# Patient Record
Sex: Male | Born: 1975
Health system: Southern US, Community
[De-identification: ages and names within clinical notes are randomized; demographics above are authoritative.]

## PROBLEM LIST (undated history)

## (undated) DIAGNOSIS — D72829 Elevated white blood cell count, unspecified: Secondary | ICD-10-CM

## (undated) DIAGNOSIS — Z7901 Long term (current) use of anticoagulants: Secondary | ICD-10-CM

## (undated) DIAGNOSIS — H539 Unspecified visual disturbance: Secondary | ICD-10-CM

## (undated) DIAGNOSIS — Z87828 Personal history of other (healed) physical injury and trauma: Secondary | ICD-10-CM

## (undated) DIAGNOSIS — E1140 Type 2 diabetes mellitus with diabetic neuropathy, unspecified: Secondary | ICD-10-CM

## (undated) DIAGNOSIS — R0609 Other forms of dyspnea: Secondary | ICD-10-CM

## (undated) DIAGNOSIS — D563 Thalassemia minor: Secondary | ICD-10-CM

## (undated) DIAGNOSIS — I70213 Atherosclerosis of native arteries of extremities with intermittent claudication, bilateral legs: Secondary | ICD-10-CM

## (undated) DIAGNOSIS — N4830 Priapism, unspecified: Secondary | ICD-10-CM

## (undated) DIAGNOSIS — Z973 Presence of spectacles and contact lenses: Secondary | ICD-10-CM

## (undated) DIAGNOSIS — H5440 Blindness, one eye, unspecified eye: Secondary | ICD-10-CM

## (undated) DIAGNOSIS — Z720 Tobacco use: Secondary | ICD-10-CM

## (undated) DIAGNOSIS — R200 Anesthesia of skin: Secondary | ICD-10-CM

## (undated) DIAGNOSIS — I639 Cerebral infarction, unspecified: Secondary | ICD-10-CM

## (undated) DIAGNOSIS — I10 Essential (primary) hypertension: Secondary | ICD-10-CM

## (undated) DIAGNOSIS — G8929 Other chronic pain: Secondary | ICD-10-CM

## (undated) DIAGNOSIS — E119 Type 2 diabetes mellitus without complications: Secondary | ICD-10-CM

## (undated) HISTORY — DX: Unspecified visual disturbance: H53.9

## (undated) HISTORY — DX: Cerebral infarction, unspecified: I63.9

## (undated) HISTORY — PX: EYE SURGERY: SHX253

## (undated) HISTORY — PX: PRIAPISM REPAIR: SHX6040

## (undated) HISTORY — PX: PENECTOMY: SHX741

## (undated) HISTORY — PX: APPENDECTOMY: SHX54

## (undated) HISTORY — DX: Blindness, one eye, unspecified eye: H54.40

---

## 2011-04-29 ENCOUNTER — Encounter: Payer: Self-pay | Admitting: *Deleted

## 2011-04-29 ENCOUNTER — Emergency Department (HOSPITAL_COMMUNITY)
Admission: EM | Admit: 2011-04-29 | Discharge: 2011-04-29 | Disposition: A | Discharge status: Home / Self Care | Payer: Self-pay | Attending: Emergency Medicine | Admitting: Emergency Medicine

## 2011-04-29 DIAGNOSIS — E119 Type 2 diabetes mellitus without complications: Secondary | ICD-10-CM | POA: Insufficient documentation

## 2011-04-29 DIAGNOSIS — H53149 Visual discomfort, unspecified: Secondary | ICD-10-CM | POA: Insufficient documentation

## 2011-04-29 DIAGNOSIS — H409 Unspecified glaucoma: Secondary | ICD-10-CM | POA: Insufficient documentation

## 2011-04-29 DIAGNOSIS — R5383 Other fatigue: Secondary | ICD-10-CM | POA: Insufficient documentation

## 2011-04-29 DIAGNOSIS — H5710 Ocular pain, unspecified eye: Secondary | ICD-10-CM | POA: Insufficient documentation

## 2011-04-29 DIAGNOSIS — F17200 Nicotine dependence, unspecified, uncomplicated: Secondary | ICD-10-CM | POA: Insufficient documentation

## 2011-04-29 DIAGNOSIS — I10 Essential (primary) hypertension: Secondary | ICD-10-CM | POA: Insufficient documentation

## 2011-04-29 DIAGNOSIS — R5381 Other malaise: Secondary | ICD-10-CM | POA: Insufficient documentation

## 2011-04-29 DIAGNOSIS — H538 Other visual disturbances: Secondary | ICD-10-CM | POA: Insufficient documentation

## 2011-04-29 DIAGNOSIS — R51 Headache: Secondary | ICD-10-CM | POA: Insufficient documentation

## 2011-04-29 DIAGNOSIS — H11419 Vascular abnormalities of conjunctiva, unspecified eye: Secondary | ICD-10-CM | POA: Insufficient documentation

## 2011-04-29 DIAGNOSIS — R739 Hyperglycemia, unspecified: Secondary | ICD-10-CM

## 2011-04-29 HISTORY — DX: Essential (primary) hypertension: I10

## 2011-04-29 HISTORY — DX: Priapism, unspecified: N48.30

## 2011-04-29 LAB — URINALYSIS, ROUTINE W REFLEX MICROSCOPIC
Glucose, UA: >1000 mg/dL — AB
Hgb urine dipstick: NEGATIVE
Leukocytes, UA: NEGATIVE
Protein, ur: NEGATIVE mg/dL
Specific Gravity, Urine: 1.043 — ABNORMAL HIGH (ref 1.005–1.030)
pH: 7 (ref 5.0–8.0)

## 2011-04-29 LAB — CBC
HCT: 41.1 % (ref 39.0–52.0)
MCHC: 32.4 g/dL (ref 30.0–36.0)
MCV: 60.4 fL — ABNORMAL LOW (ref 78.0–100.0)
RDW: 15.8 % — ABNORMAL HIGH (ref 11.5–15.5)

## 2011-04-29 LAB — BASIC METABOLIC PANEL
BUN: 8 mg/dL (ref 6–23)
Creatinine, Ser: 0.74 mg/dL (ref 0.50–1.35)
GFR calc Af Amer: >90 mL/min (ref >90)
GFR calc non Af Amer: >90 mL/min (ref >90)

## 2011-04-29 LAB — GLUCOSE, CAPILLARY
Glucose-Capillary: 344 mg/dL — ABNORMAL HIGH (ref 70–99)
Glucose-Capillary: 325 mg/dL — ABNORMAL HIGH (ref 70–99)
Glucose-Capillary: 210 mg/dL — ABNORMAL HIGH (ref 70–99)

## 2011-04-29 LAB — URINE MICROSCOPIC-ADD ON

## 2011-04-29 MED ORDER — PROPARACAINE HCL 0.5 % OP SOLN
2.0000 [drp] | Freq: Once | OPHTHALMIC | Status: AC
  Administered 2011-04-29: 2 [drp] via OPHTHALMIC
  Filled 2011-04-29: qty 15

## 2011-04-29 MED ORDER — SODIUM CHLORIDE 0.9 % IV BOLUS (SEPSIS)
1000.0000 mL | Freq: Once | INTRAVENOUS | Status: AC
  Administered 2011-04-29: 1000 mL via INTRAVENOUS

## 2011-04-29 MED ORDER — INSULIN REGULAR HUMAN 100 UNIT/ML IJ SOLN: 12.0000 [IU] | Freq: Once | INTRAMUSCULAR | Status: DC

## 2011-04-29 MED ORDER — METFORMIN HCL 500 MG PO TABS: 500.0000 mg | ORAL_TABLET | Freq: Two times a day (BID) | ORAL | Status: DC

## 2011-04-29 MED ORDER — INSULIN ASPART 100 UNIT/ML ~~LOC~~ SOLN: 12.0000 [IU] | Freq: Once | SUBCUTANEOUS | Status: DC

## 2011-04-29 MED ORDER — INSULIN REGULAR HUMAN 100 UNIT/ML IJ SOLN: 8.0000 [IU] | Freq: Once | INTRAMUSCULAR | Status: DC

## 2011-04-29 MED ORDER — HYDROCHLOROTHIAZIDE 25 MG PO TABS: 25.0000 mg | ORAL_TABLET | Freq: Every day | ORAL | Status: DC

## 2011-04-29 MED ORDER — INSULIN ASPART 100 UNIT/ML ~~LOC~~ SOLN
8.0000 [IU] | Freq: Once | SUBCUTANEOUS | Status: AC
  Administered 2011-04-29: 8 [IU] via SUBCUTANEOUS
  Filled 2011-04-29: qty 1

## 2011-04-29 NOTE — ED Notes (Signed)
Pt states "I've not taken my diabetic medicine  X 3-4 months, I don't know why the right side of my head keeps hurting, haven't seen anyone for my diabetes in a long time"

## 2011-04-29 NOTE — ED Notes (Signed)
Pt cbg was at 325

## 2011-04-29 NOTE — ED Provider Notes (Signed)
History     CSN: [id number]  Arrival date & time 04/29/11  1119   First MD Initiated Contact with Patient 04/29/11 1232      Chief Complaint  Patient presents with  . Hypertension  . Blood Sugar Problem    (Consider location/radiation/quality/duration/timing/severity/associated sxs/prior treatment) Patient is a 36 y.o. male presenting with headaches.  Headache  This is a new problem. The current episode started yesterday. The problem has been gradually worsening. The headache is associated with bright light. The pain is located in the right unilateral region. The quality of the pain is described as throbbing. The pain is at a severity of 8/10. The pain is moderate. The pain does not radiate. Associated symptoms include malaise/fatigue. Pertinent negatives include no fever, no syncope, no nausea and no vomiting.  Pt states headache comes and goes, has been going on for about 6 months. States began again last night. Admits to some blurred vision. Also states has been out of his blood pressure and diabetes medications for about 3 months. States at times feels weak, nauseated. Denies fever, chills, chest pain, abdominal pain, n/v/d.   Past Medical History  Diagnosis Date  . Diabetes mellitus   . Hypertension   . Priapism     Past Surgical History  Procedure Date  . Penectomy     No family history on file.  History  Substance Use Topics  . Smoking status: Current Some Day Smoker  . Smokeless tobacco: Not on file  . Alcohol Use: Yes     ocassionally      Review of Systems  Constitutional: Positive for malaise/fatigue and fatigue. Negative for fever, chills, activity change and appetite change.  HENT: Negative for hearing loss and congestion.   Eyes: Positive for photophobia, pain and visual disturbance. Negative for discharge.  Respiratory: Negative.   Cardiovascular: Negative.  Negative for syncope.  Gastrointestinal: Negative.  Negative for nausea and vomiting.    Genitourinary: Negative.   Musculoskeletal: Negative.   Neurological: Positive for headaches.  Psychiatric/Behavioral: Negative.     Allergies  Review of patient's allergies indicates no known allergies.  Home Medications  No current outpatient prescriptions on file.  BP 180/92  Pulse 104  Temp(Src) 98.2 F (36.8 C) (Oral)  Resp 16  Wt 193 lb 12.6 oz (87.9 kg)  SpO2 100%  Physical Exam  Nursing note and vitals reviewed. Constitutional: He is oriented to person, place, and time. He appears well-developed and well-nourished. No distress.  HENT:  Head: Normocephalic and atraumatic.  Eyes: EOM are normal. No foreign bodies found. Right conjunctiva is injected. Left conjunctiva is not injected. Right pupil is not reactive. Right pupil is round. Left pupil is round and reactive. Pupils are unequal.  Neck: Neck supple.  Cardiovascular: Normal rate and regular rhythm.   Pulmonary/Chest: Effort normal and breath sounds normal. No respiratory distress.  Abdominal: Soft. Bowel sounds are normal. There is no tenderness.  Musculoskeletal: Normal range of motion. He exhibits no edema.  Neurological: He is alert and oriented to person, place, and time.  Skin: Skin is warm and dry. No erythema.  Psychiatric: He has a normal mood and affect.    ED Course  Procedures (including critical care time)  Pt with hx of htn, diabetes, non compliant. Noted unequal and poorly reactive right pupil. Visual acuity performed. 20/50 L , unable to see from right, only light and colors. Pressure measured. 53 in the right eye, 30 in the left. Pt does not appear to be  in DKA, will administer fluids, insulin, will call ophthamology.  Spoke with Dr. Luciana Axe, pt to follow up either today in the office once blood sugar is stablized, or tomorrow at 9am. Pt's blood glucose improve with IV boluses and SQ insulin.   Results for orders placed during the hospital encounter of 04/29/11  GLUCOSE, CAPILLARY       Component Value Range   Glucose-Capillary 430 (*) 70 - 99 (mg/dL)   Comment 1 Documented in Chart     Comment 2 Notify RN    CBC      Component Value Range   WBC 7.4  4.0 - 10.5 (K/uL)   RBC 6.80 (*) 4.22 - 5.81 (MIL/uL)   Hemoglobin 13.3  13.0 - 17.0 (g/dL)   HCT 16.1  09.6 - 04.5 (%)   MCV 60.4 (*) 78.0 - 100.0 (fL)   MCH 19.6 (*) 26.0 - 34.0 (pg)   MCHC 32.4  30.0 - 36.0 (g/dL)   RDW 40.9 (*) 81.1 - 15.5 (%)   Platelets 146 (*) 150 - 400 (K/uL)  BASIC METABOLIC PANEL      Component Value Range   Sodium 134 (*) 135 - 145 (mEq/L)   Potassium 4.2  3.5 - 5.1 (mEq/L)   Chloride 97  96 - 112 (mEq/L)   CO2 28  19 - 32 (mEq/L)   Glucose, Bld 368 (*) 70 - 99 (mg/dL)   BUN 8  6 - 23 (mg/dL)   Creatinine, Ser 9.14  0.50 - 1.35 (mg/dL)   Calcium 9.0  8.4 - 78.2 (mg/dL)   GFR calc non Af Amer >90  >90 (mL/min)   GFR calc Af Amer >90  >90 (mL/min)  URINALYSIS, ROUTINE W REFLEX MICROSCOPIC      Component Value Range   Color, Urine YELLOW  YELLOW    APPearance CLEAR  CLEAR    Specific Gravity, Urine 1.043 (*) 1.005 - 1.030    pH 7.0  5.0 - 8.0    Glucose, UA >1000 (*) NEGATIVE (mg/dL)   Hgb urine dipstick NEGATIVE  NEGATIVE    Bilirubin Urine NEGATIVE  NEGATIVE    Ketones, ur TRACE (*) NEGATIVE (mg/dL)   Protein, ur NEGATIVE  NEGATIVE (mg/dL)   Urobilinogen, UA 0.2  0.0 - 1.0 (mg/dL)   Nitrite NEGATIVE  NEGATIVE    Leukocytes, UA NEGATIVE  NEGATIVE   URINE MICROSCOPIC-ADD ON      Component Value Range   Squamous Epithelial / LPF RARE  RARE    WBC, UA 3-6  <3 (WBC/hpf)   Bacteria, UA FEW (*) RARE   GLUCOSE, CAPILLARY      Component Value Range   Glucose-Capillary 344 (*) 70 - 99 (mg/dL)  GLUCOSE, CAPILLARY      Component Value Range   Glucose-Capillary 325 (*) 70 - 99 (mg/dL)  GLUCOSE, CAPILLARY      Component Value Range   Glucose-Capillary 210 (*) 70 - 99 (mg/dL)   No results found.  Pt stable for discharge. Will follow up with Dr. Luciana Axe in the office in am. Dr. Luciana Axe  emphasized not to start pt on any medications for his eye.   MDM          Lottie Mussel, PA 04/29/11 2022

## 2011-04-29 NOTE — ED Notes (Signed)
Pt. cbg was 210

## 2011-04-30 NOTE — ED Provider Notes (Signed)
Medical screening examination/treatment/procedure(s) were performed by non-physician practitioner and as supervising physician I was immediately available for consultation/collaboration. Alverta Caccamo Y.   Gavin Pound. Palma Buster, MD 04/30/11 1310

## 2011-07-26 ENCOUNTER — Emergency Department (HOSPITAL_COMMUNITY)
Admission: EM | Admit: 2011-07-26 | Discharge: 2011-07-26 | Disposition: A | Discharge status: Home / Self Care | Payer: Self-pay | Attending: Emergency Medicine | Admitting: Emergency Medicine

## 2011-07-26 ENCOUNTER — Encounter (HOSPITAL_COMMUNITY): Payer: Self-pay | Admitting: *Deleted

## 2011-07-26 DIAGNOSIS — E119 Type 2 diabetes mellitus without complications: Secondary | ICD-10-CM | POA: Insufficient documentation

## 2011-07-26 DIAGNOSIS — R000 Tachycardia, unspecified: Secondary | ICD-10-CM | POA: Insufficient documentation

## 2011-07-26 DIAGNOSIS — R350 Frequency of micturition: Secondary | ICD-10-CM | POA: Insufficient documentation

## 2011-07-26 DIAGNOSIS — R5383 Other fatigue: Secondary | ICD-10-CM | POA: Insufficient documentation

## 2011-07-26 DIAGNOSIS — R5381 Other malaise: Secondary | ICD-10-CM | POA: Insufficient documentation

## 2011-07-26 DIAGNOSIS — E1165 Type 2 diabetes mellitus with hyperglycemia: Secondary | ICD-10-CM

## 2011-07-26 DIAGNOSIS — R112 Nausea with vomiting, unspecified: Secondary | ICD-10-CM | POA: Insufficient documentation

## 2011-07-26 LAB — COMPREHENSIVE METABOLIC PANEL
ALT: 9 U/L (ref 0–53)
Alkaline Phosphatase: 95 U/L (ref 39–117)
BUN: 10 mg/dL (ref 6–23)
CO2: 26 mEq/L (ref 19–32)
Calcium: 8.7 mg/dL (ref 8.4–10.5)
GFR calc Af Amer: >90 mL/min (ref >90)
GFR calc non Af Amer: >90 mL/min (ref >90)
Glucose, Bld: 314 mg/dL — ABNORMAL HIGH (ref 70–99)
Potassium: 4.5 mEq/L (ref 3.5–5.1)
Sodium: 134 mEq/L — ABNORMAL LOW (ref 135–145)
Total Protein: 6.8 g/dL (ref 6.0–8.3)

## 2011-07-26 LAB — POCT I-STAT, CHEM 8
BUN: 10 mg/dL (ref 6–23)
Calcium, Ion: 1.09 mmol/L — ABNORMAL LOW (ref 1.12–1.32)
Chloride: 94 mEq/L — ABNORMAL LOW (ref 96–112)
Glucose, Bld: 310 mg/dL — ABNORMAL HIGH (ref 70–99)
HCT: 42 % (ref 39.0–52.0)
Potassium: 3.9 mEq/L (ref 3.5–5.1)

## 2011-07-26 MED ORDER — INSULIN ASPART 100 UNIT/ML ~~LOC~~ SOLN
10.0000 [IU] | Freq: Once | SUBCUTANEOUS | Status: AC
  Administered 2011-07-26: 10 [IU] via INTRAVENOUS
  Filled 2011-07-26: qty 1

## 2011-07-26 MED ORDER — METFORMIN HCL 1000 MG PO TABS: 1000.0000 mg | ORAL_TABLET | Freq: Two times a day (BID) | ORAL | Status: DC

## 2011-07-26 MED ORDER — SODIUM CHLORIDE 0.9 % IV BOLUS (SEPSIS)
1000.0000 mL | Freq: Once | INTRAVENOUS | Status: DC
  Administered 2011-07-26: 1000 mL via INTRAVENOUS

## 2011-07-26 MED ORDER — SODIUM CHLORIDE 0.9 % IV BOLUS (SEPSIS): 1000.0000 mL | Freq: Once | INTRAVENOUS | Status: DC

## 2011-07-26 MED ORDER — SODIUM CHLORIDE 0.9 % IV BOLUS (SEPSIS)
1000.0000 mL | Freq: Once | INTRAVENOUS | Status: AC
  Administered 2011-07-26: 1000 mL via INTRAVENOUS

## 2011-07-26 NOTE — ED Notes (Addendum)
Patient reports he has not felt well for 3 days.  He states he has had nausea and diaphoresis.  He states he has not been able to eat.  Patient reports he does not have a glucometer at home

## 2011-07-26 NOTE — ED Notes (Signed)
cbg 321

## 2011-07-26 NOTE — Discharge Instructions (Signed)
1. Please follow-up with your PCP. If you do not have a PCP, please call [phone] to establish a care with internal medicine clinic. 2. I increased your metformin dose to 1000 mg twice daily. Please take it regularly. 3. It is very important to establish a long term care with one of primary care doctors for your diabetes.  4. If you have worsening of your symptoms or new symptoms arise, please call the clinic ([phone]), or go to the ER immediately if symptoms are severe.  Diabetes and Exercise Regular exercise is important and can help:   Control blood glucose (sugar).   Decrease blood pressure.    Control blood lipids (cholesterol, triglycerides).   Improve overall health.  BENEFITS FROM EXERCISE  Improved fitness.   Improved flexibility.   Improved endurance.   Increased bone density.   Weight control.   Increased muscle strength.   Decreased body fat.   Improvement of the body's use of insulin, a hormone.   Increased insulin sensitivity.   Reduction of insulin needs.   Reduced stress and tension.   Helps you feel better.  People with diabetes who add exercise to their lifestyle gain additional benefits, including:  Weight loss.   Reduced appetite.   Improvement of the body's use of blood glucose.   Decreased risk factors for heart disease:   Lowering of cholesterol and triglycerides.   Raising the level of good cholesterol (high-density lipoproteins, HDL).   Lowering blood sugar.   Decreased blood pressure.  TYPE 1 DIABETES AND EXERCISE  Exercise will usually lower your blood glucose.   If blood glucose is greater than 240 mg/dl, check urine ketones. If ketones are present, do not exercise.   Location of the insulin injection sites may need to be adjusted with exercise. Avoid injecting insulin into areas of the body that will be exercised. For example, avoid injecting insulin into:   The arms when playing tennis.   The legs when jogging. For  more information, discuss this with your caregiver.   Keep a record of:   Food intake.   Type and amount of exercise.   Expected peak times of insulin action.   Blood glucose levels.  Do this before, during, and after exercise. Review your records with your caregiver. This will help you to develop guidelines for adjusting food intake and insulin amounts.  TYPE 2 DIABETES AND EXERCISE  Regular physical activity can help control blood glucose.   Exercise is important because it may:   Increase the body's sensitivity to insulin.   Improve blood glucose control.   Exercise reduces the risk of heart disease. It decreases serum cholesterol and triglycerides. It also lowers blood pressure.   Those who take insulin or oral hypoglycemic agents should watch for signs of hypoglycemia. These signs include dizziness, shaking, sweating, chills, and confusion.   Body water is lost during exercise. It must be replaced. This will help to avoid loss of body fluids (dehydration) or heat stroke.  Be sure to talk to your caregiver before starting an exercise program to make sure it is safe for you. Remember, any activity is better than none.  Document Released: 07/04/2003 Document Revised: 04/02/2011 Document Reviewed: 10/18/2008 [facility] Patient Information 2012 [place], [place].

## 2011-07-26 NOTE — ED Provider Notes (Signed)
History     CSN: [id number]  Arrival date & time 07/26/11  8657   First MD Initiated Contact with Patient 07/26/11 1308      Chief Complaint  Patient presents with  . Weakness  . Nausea  . Dizziness  . Excessive Sweating  . Headache    (Consider location/radiation/quality/duration/timing/severity/associated sxs/prior treatment) HPI  Patient is 36 yo man with PMH of HTN and DM-II, who presents with generalized weakness, increased urinary frequency and thirty.   Per patient, he started having could like symptoms 3 days ago, including running nose, sore throat, headache, fever, chills, dry cough. He took extra does of his metformin and HCTZ without any help.  He also has nausea and vomited once food materials without blood it yesterday. He does not have diarrhea and abdominal pain, but with mild uncomfortableness in his abdomen. currently his cold likely symptoms are better. But he has poor appetite and decreased oral intake. He feels tired and thirsty all the times. He has increased urinary frequency without dysuria or burning sensation on urination.  denies fever, chills, cough, chest pain, SOB,  hematuria, joint pain or leg swelling.    Past Medical History  Diagnosis Date  . Diabetes mellitus   . Hypertension   . Priapism     Past Surgical History  Procedure Date  . Penectomy     No family history on file.  History  Substance Use Topics  . Smoking status: Current Some Day Smoker  . Smokeless tobacco: Not on file  . Alcohol Use: Yes     ocassionally    Review of Systems  Constitutional: Negative for fever, appetite change and fatigue.       He has decreased appetite   HENT: Negative for ear pain, congestion, sore throat, sneezing, mouth sores, trouble swallowing, neck pain, neck stiffness and voice change.   Eyes: Negative for photophobia, pain, discharge and visual disturbance.  Respiratory: Negative for apnea, cough, choking, chest tightness, shortness of  breath, wheezing and stridor.   Cardiovascular: Negative for chest pain and leg swelling.  Gastrointestinal: Positive for nausea and vomiting. Negative for abdominal pain, diarrhea, constipation, blood in stool, abdominal distention and rectal pain.  Genitourinary: Positive for frequency. Negative for dysuria, urgency and hematuria.  Musculoskeletal: Negative for back pain, joint swelling, arthralgias and gait problem.  Skin: Negative for rash and wound.  Neurological: Positive for weakness. Negative for dizziness, tremors, seizures, syncope, numbness and headaches.  Hematological: Negative for adenopathy. Does not bruise/bleed easily.  Psychiatric/Behavioral: Negative for hallucinations, behavioral problems, confusion and agitation.    Allergies  Review of patient's allergies indicates no known allergies.  Home Medications   Current Outpatient Rx  Name Route Sig Dispense Refill  . HYDROCHLOROTHIAZIDE 25 MG PO TABS Oral Take 25 mg by mouth daily.    Marland Kitchen METFORMIN HCL 500 MG PO TABS Oral Take 500 mg by mouth 2 (two) times daily with a meal.    . METFORMIN HCL 500 MG PO TABS Oral Take 1 tablet (500 mg total) by mouth 2 (two) times daily with a meal. 60 tablet 3    BP 141/77  Pulse 86  Temp(Src) 98.6 F (37 C) (Oral)  Resp 25  Ht 5' 10.5" (1.791 m)  Wt 190 lb (86.183 kg)  BMI 26.88 kg/m2  SpO2 96%  Physical Exam  General: resting in bed, not in acute distress HEENT: PERRL, EOMI, no scleral icterus. Has dry mucous and membrane. Cardiac: S1/S2, tachycardia, RRR, No murmurs, gallops or  rubs Pulm: Good air movement bilaterally, Clear to auscultation bilaterally, No rales, wheezing, rhonchi or rubs. Abd: Soft,  nondistended, nontender, no rebound pain, no organomegaly, BS present Ext: No rashes or edema, 2+DP/PT pulse bilaterally Neuro: alert and oriented X3, cranial nerves II-XII grossly intact, muscle strength 5/5 in all extremeties,  sensation to light touch intact.   ED Course    Procedures (including critical care time)  Patient's symptoms are likely caused by uncontrolled DM-II after recent viral infection. His CBG is 365 today. Patient had a AG of 15. He was treated with IV fluid and 10 U of insulin. Repeat BMP showed that his AG is 12. He feels much better. Patient will be discharged home. His metformin dosage will be increased to 1000 mg Bid. Patient is instructed to establish a care with PCP.      Labs Reviewed  GLUCOSE, CAPILLARY - Abnormal; Notable for the following:    Glucose-Capillary 365 (*)    All other components within normal limits   No results found.   No diagnosis found.    MDM          Lorretta Harp, MD 07/26/11 [phone]

## 2011-07-26 NOTE — ED Notes (Signed)
Upon further discussion with the pt, found out that pt has lived in [place] for 5 years. At one point he had an orange card and went to [facility], pt did not like having different doctors every time he went so he quit going and let his orange card expire. Pt reports that he has had diabetes for 5 years.

## 2011-07-26 NOTE — ED Notes (Signed)
Talked with Dr Oletta Lamas and he reports that Frank Schneider needs to go to the back. Frank Schneider has iv started and running wide open

## 2011-07-26 NOTE — ED Provider Notes (Signed)
I saw and evaluated the patient, reviewed the resident's note and I agree with the findings and plan.   .Face to face Exam:  General:  Awake HEENT:  Atraumatic Resp:  Normal effort Abd:  Nondistended Neuro:No focal weakness Lymph: No adenopathy   After treatment in the ED the patient feels back to baseline and wants to go home.   Nelia Shi, MD 07/26/11 2123

## 2011-07-26 NOTE — ED Notes (Signed)
Pt reports that he recently moved to [facility] and he doesn't have a pcp and lost his glucometer. Pt hasnt been feeling well for several days. Last tried to eat at 3pm yesterday but was too nauseated to eat. Pt reports that he has still been taking his metformin.

## 2011-07-27 LAB — GLUCOSE, CAPILLARY: Glucose-Capillary: 321 mg/dL — ABNORMAL HIGH (ref 70–99)

## 2011-07-28 ENCOUNTER — Encounter (HOSPITAL_COMMUNITY): Payer: Self-pay | Admitting: Anesthesiology

## 2011-07-28 ENCOUNTER — Inpatient Hospital Stay (HOSPITAL_COMMUNITY)
Admission: EM | Admit: 2011-07-28 | Discharge: 2011-08-03 | DRG: 983 | Disposition: A | Discharge status: Home / Self Care | Payer: Self-pay | Attending: Surgery | Admitting: Surgery

## 2011-07-28 ENCOUNTER — Emergency Department (HOSPITAL_COMMUNITY): Payer: Self-pay

## 2011-07-28 ENCOUNTER — Encounter (HOSPITAL_COMMUNITY): Payer: Self-pay | Admitting: Emergency Medicine

## 2011-07-28 ENCOUNTER — Other Ambulatory Visit: Payer: Self-pay

## 2011-07-28 ENCOUNTER — Encounter (HOSPITAL_COMMUNITY): Admission: EM | Disposition: A | Discharge status: Home / Self Care | Payer: Self-pay | Source: Home / Self Care

## 2011-07-28 ENCOUNTER — Inpatient Hospital Stay (HOSPITAL_COMMUNITY): Payer: Self-pay | Admitting: Anesthesiology

## 2011-07-28 DIAGNOSIS — N501 Vascular disorders of male genital organs: Secondary | ICD-10-CM

## 2011-07-28 DIAGNOSIS — F1010 Alcohol abuse, uncomplicated: Secondary | ICD-10-CM | POA: Diagnosis present

## 2011-07-28 DIAGNOSIS — L02219 Cutaneous abscess of trunk, unspecified: Secondary | ICD-10-CM

## 2011-07-28 DIAGNOSIS — IMO0001 Reserved for inherently not codable concepts without codable children: Secondary | ICD-10-CM | POA: Diagnosis present

## 2011-07-28 DIAGNOSIS — A4902 Methicillin resistant Staphylococcus aureus infection, unspecified site: Secondary | ICD-10-CM | POA: Diagnosis present

## 2011-07-28 DIAGNOSIS — Z720 Tobacco use: Secondary | ICD-10-CM

## 2011-07-28 DIAGNOSIS — N493 Fournier gangrene: Secondary | ICD-10-CM

## 2011-07-28 DIAGNOSIS — I10 Essential (primary) hypertension: Secondary | ICD-10-CM | POA: Diagnosis present

## 2011-07-28 DIAGNOSIS — R739 Hyperglycemia, unspecified: Secondary | ICD-10-CM

## 2011-07-28 DIAGNOSIS — L03319 Cellulitis of trunk, unspecified: Secondary | ICD-10-CM

## 2011-07-28 DIAGNOSIS — Z794 Long term (current) use of insulin: Secondary | ICD-10-CM

## 2011-07-28 DIAGNOSIS — F17200 Nicotine dependence, unspecified, uncomplicated: Secondary | ICD-10-CM | POA: Diagnosis present

## 2011-07-28 DIAGNOSIS — K612 Anorectal abscess: Principal | ICD-10-CM | POA: Diagnosis present

## 2011-07-28 HISTORY — DX: Tobacco use: Z72.0

## 2011-07-28 HISTORY — PX: INCISION AND DRAINAGE PERIRECTAL ABSCESS: SHX1804

## 2011-07-28 LAB — CBC
Hemoglobin: 12.7 g/dL — ABNORMAL LOW (ref 13.0–17.0)
MCH: 19.9 pg — ABNORMAL LOW (ref 26.0–34.0)
MCV: 59.6 fL — ABNORMAL LOW (ref 78.0–100.0)
Platelets: 235 10*3/uL (ref 150–400)
RBC: 6.39 MIL/uL — ABNORMAL HIGH (ref 4.22–5.81)
WBC: 24.5 10*3/uL — ABNORMAL HIGH (ref 4.0–10.5)

## 2011-07-28 LAB — BASIC METABOLIC PANEL
Chloride: 86 mEq/L — ABNORMAL LOW (ref 96–112)
GFR calc Af Amer: >90 mL/min (ref >90)
GFR calc non Af Amer: >90 mL/min (ref >90)
Potassium: 4.7 mEq/L (ref 3.5–5.1)
Sodium: 127 mEq/L — ABNORMAL LOW (ref 135–145)

## 2011-07-28 LAB — GLUCOSE, CAPILLARY
Glucose-Capillary: 329 mg/dL — ABNORMAL HIGH (ref 70–99)
Glucose-Capillary: 272 mg/dL — ABNORMAL HIGH (ref 70–99)
Glucose-Capillary: 293 mg/dL — ABNORMAL HIGH (ref 70–99)
Glucose-Capillary: 298 mg/dL — ABNORMAL HIGH (ref 70–99)

## 2011-07-28 LAB — MAGNESIUM: Magnesium: 1.8 mg/dL (ref 1.5–2.5)

## 2011-07-28 LAB — URINALYSIS, ROUTINE W REFLEX MICROSCOPIC
Bilirubin Urine: NEGATIVE
Glucose, UA: >1000 mg/dL — AB
Hgb urine dipstick: NEGATIVE
Specific Gravity, Urine: >1.046 — ABNORMAL HIGH (ref 1.005–1.030)
Urobilinogen, UA: 1 mg/dL (ref 0.0–1.0)

## 2011-07-28 LAB — APTT: aPTT: 31 seconds (ref 24–37)

## 2011-07-28 LAB — DIFFERENTIAL
Basophils Relative: 0 % (ref 0–1)
Eosinophils Relative: 0 % (ref 0–5)
Monocytes Absolute: 1.7 10*3/uL — ABNORMAL HIGH (ref 0.1–1.0)
Monocytes Relative: 7 % (ref 3–12)
Neutrophils Relative %: 84 % — ABNORMAL HIGH (ref 43–77)

## 2011-07-28 LAB — RAPID URINE DRUG SCREEN, HOSP PERFORMED
Amphetamines: NOT DETECTED
Benzodiazepines: NOT DETECTED
Opiates: NOT DETECTED

## 2011-07-28 LAB — HEPATIC FUNCTION PANEL
AST: 12 U/L (ref 0–37)
Albumin: 3.2 g/dL — ABNORMAL LOW (ref 3.5–5.2)
Total Bilirubin: 0.6 mg/dL (ref 0.3–1.2)
Total Protein: 7.4 g/dL (ref 6.0–8.3)

## 2011-07-28 LAB — LACTIC ACID, PLASMA: Lactic Acid, Venous: 1.3 mmol/L (ref 0.5–2.2)

## 2011-07-28 LAB — PROTIME-INR
INR: 1.17 (ref 0.00–1.49)
Prothrombin Time: 15.1 seconds (ref 11.6–15.2)

## 2011-07-28 SURGERY — INCISION AND DRAINAGE, ABSCESS, PERIRECTAL
Anesthesia: General | Site: Perineum | Wound class: Dirty or Infected

## 2011-07-28 MED ORDER — ACETAMINOPHEN 325 MG PO TABS: 650.0000 mg | ORAL_TABLET | Freq: Four times a day (QID) | ORAL | Status: DC | PRN

## 2011-07-28 MED ORDER — PANTOPRAZOLE SODIUM 40 MG IV SOLR
40.0000 mg | Freq: Every day | INTRAVENOUS | Status: DC
  Administered 2011-07-28 – 2011-07-29 (×2): 40 mg via INTRAVENOUS
  Filled 2011-07-28 (×3): qty 40

## 2011-07-28 MED ORDER — SODIUM CHLORIDE 0.9 % IV SOLN: INTRAVENOUS | Status: DC

## 2011-07-28 MED ORDER — PROPOFOL 10 MG/ML IV BOLUS
INTRAVENOUS | Status: DC | PRN
  Administered 2011-07-28: 200 mg via INTRAVENOUS

## 2011-07-28 MED ORDER — ONDANSETRON HCL 4 MG/2ML IJ SOLN: 4.0000 mg | Freq: Four times a day (QID) | INTRAMUSCULAR | Status: DC | PRN

## 2011-07-28 MED ORDER — MORPHINE SULFATE 2 MG/ML IJ SOLN
1.0000 mg | INTRAMUSCULAR | Status: DC | PRN
  Administered 2011-07-28: 2 mg via INTRAVENOUS
  Filled 2011-07-28: qty 1

## 2011-07-28 MED ORDER — SODIUM CHLORIDE 0.9 % IV SOLN
INTRAVENOUS | Status: DC
  Administered 2011-07-29 – 2011-08-02 (×5): via INTRAVENOUS

## 2011-07-28 MED ORDER — PNEUMOCOCCAL VAC POLYVALENT 25 MCG/0.5ML IJ INJ
0.5000 mL | INJECTION | INTRAMUSCULAR | Status: AC
  Administered 2011-07-29: 0.5 mL via INTRAMUSCULAR
  Filled 2011-07-28: qty 0.5

## 2011-07-28 MED ORDER — INSULIN REGULAR BOLUS VIA INFUSION
0.0000 [IU] | Freq: Three times a day (TID) | INTRAVENOUS | Status: DC
  Filled 2011-07-28: qty 10

## 2011-07-28 MED ORDER — VANCOMYCIN HCL IN DEXTROSE 1-5 GM/200ML-% IV SOLN
1000.0000 mg | Freq: Three times a day (TID) | INTRAVENOUS | Status: DC
  Administered 2011-07-28 – 2011-07-29 (×4): 1000 mg via INTRAVENOUS
  Filled 2011-07-28 (×7): qty 200

## 2011-07-28 MED ORDER — LIDOCAINE HCL (CARDIAC) 20 MG/ML IV SOLN
INTRAVENOUS | Status: DC | PRN
  Administered 2011-07-28: 100 mg via INTRAVENOUS

## 2011-07-28 MED ORDER — METOPROLOL TARTRATE 1 MG/ML IV SOLN
5.0000 mg | INTRAVENOUS | Status: AC | PRN
  Administered 2011-07-28: 5 mg via INTRAVENOUS

## 2011-07-28 MED ORDER — OXYCODONE-ACETAMINOPHEN 5-325 MG PO TABS
1.0000 | ORAL_TABLET | ORAL | Status: DC | PRN
  Administered 2011-07-30 – 2011-07-31 (×2): 1 via ORAL
  Administered 2011-07-31 – 2011-08-03 (×9): 2 via ORAL
  Filled 2011-07-28 (×6): qty 2
  Filled 2011-07-28 (×2): qty 1
  Filled 2011-07-28 (×3): qty 2
  Filled 2011-07-28: qty 1

## 2011-07-28 MED ORDER — MIDAZOLAM HCL 5 MG/5ML IJ SOLN
INTRAMUSCULAR | Status: DC | PRN
  Administered 2011-07-28: 2 mg via INTRAVENOUS

## 2011-07-28 MED ORDER — VANCOMYCIN HCL IN DEXTROSE 1-5 GM/200ML-% IV SOLN
1000.0000 mg | Freq: Once | INTRAVENOUS | Status: AC
  Administered 2011-07-28: 1000 mg via INTRAVENOUS
  Filled 2011-07-28: qty 200

## 2011-07-28 MED ORDER — SUCCINYLCHOLINE CHLORIDE 20 MG/ML IJ SOLN
INTRAMUSCULAR | Status: DC | PRN
  Administered 2011-07-28: 100 mg via INTRAVENOUS

## 2011-07-28 MED ORDER — HEPARIN SODIUM (PORCINE) 5000 UNIT/ML IJ SOLN
5000.0000 [IU] | Freq: Three times a day (TID) | INTRAMUSCULAR | Status: DC
  Administered 2011-07-28 – 2011-08-03 (×18): 5000 [IU] via SUBCUTANEOUS
  Filled 2011-07-28 (×21): qty 1

## 2011-07-28 MED ORDER — SODIUM CHLORIDE 0.9 % IV SOLN
INTRAVENOUS | Status: DC
  Administered 2011-07-28: 18:00:00 via INTRAVENOUS
  Administered 2011-07-28: 1000 mL via INTRAVENOUS

## 2011-07-28 MED ORDER — SODIUM CHLORIDE 0.9 % IR SOLN
Status: DC | PRN
  Administered 2011-07-28: 3000 mL

## 2011-07-28 MED ORDER — ACETAMINOPHEN 650 MG RE SUPP: 650.0000 mg | Freq: Four times a day (QID) | RECTAL | Status: DC | PRN

## 2011-07-28 MED ORDER — METOPROLOL TARTRATE 1 MG/ML IV SOLN
INTRAVENOUS | Status: AC
  Filled 2011-07-28: qty 5

## 2011-07-28 MED ORDER — DEXTROSE-NACL 5-0.45 % IV SOLN: INTRAVENOUS | Status: DC

## 2011-07-28 MED ORDER — FENTANYL CITRATE 0.05 MG/ML IJ SOLN
25.0000 ug | INTRAMUSCULAR | Status: DC | PRN
  Administered 2011-07-28: 25 ug via INTRAVENOUS

## 2011-07-28 MED ORDER — KETOROLAC TROMETHAMINE 30 MG/ML IJ SOLN: 15.0000 mg | Freq: Once | INTRAMUSCULAR | Status: DC | PRN

## 2011-07-28 MED ORDER — METRONIDAZOLE IN NACL 5-0.79 MG/ML-% IV SOLN: 500.0000 mg | Freq: Once | INTRAVENOUS | Status: DC

## 2011-07-28 MED ORDER — IOHEXOL 300 MG/ML  SOLN
100.0000 mL | Freq: Once | INTRAMUSCULAR | Status: AC | PRN
  Administered 2011-07-28: 100 mL via INTRAVENOUS

## 2011-07-28 MED ORDER — FENTANYL CITRATE 0.05 MG/ML IJ SOLN
INTRAMUSCULAR | Status: AC
  Administered 2011-07-28: 25 ug
  Filled 2011-07-28: qty 2

## 2011-07-28 MED ORDER — MORPHINE SULFATE 2 MG/ML IJ SOLN
2.0000 mg | INTRAMUSCULAR | Status: DC | PRN
  Administered 2011-07-29 (×3): 2 mg via INTRAVENOUS
  Filled 2011-07-28 (×3): qty 1

## 2011-07-28 MED ORDER — SODIUM CHLORIDE 0.9 % IV SOLN
1.0000 g | INTRAVENOUS | Status: DC
  Administered 2011-07-29 – 2011-08-02 (×5): 1 g via INTRAVENOUS
  Filled 2011-07-28 (×6): qty 1

## 2011-07-28 MED ORDER — ONDANSETRON HCL 4 MG/2ML IJ SOLN
INTRAMUSCULAR | Status: DC | PRN
  Administered 2011-07-28: 4 mg via INTRAVENOUS

## 2011-07-28 MED ORDER — CLINDAMYCIN PHOSPHATE 600 MG/50ML IV SOLN
600.0000 mg | Freq: Three times a day (TID) | INTRAVENOUS | Status: DC
  Administered 2011-07-29 – 2011-08-03 (×18): 600 mg via INTRAVENOUS
  Filled 2011-07-28 (×20): qty 50

## 2011-07-28 MED ORDER — ONDANSETRON HCL 4 MG PO TABS: 4.0000 mg | ORAL_TABLET | Freq: Four times a day (QID) | ORAL | Status: DC | PRN

## 2011-07-28 MED ORDER — CLINDAMYCIN PHOSPHATE 600 MG/50ML IV SOLN
600.0000 mg | Freq: Once | INTRAVENOUS | Status: AC
  Administered 2011-07-28: 600 mg via INTRAVENOUS
  Filled 2011-07-28: qty 50

## 2011-07-28 MED ORDER — MEPERIDINE HCL 50 MG/ML IJ SOLN: 6.2500 mg | INTRAMUSCULAR | Status: DC | PRN

## 2011-07-28 MED ORDER — DOCUSATE SODIUM 100 MG PO CAPS
100.0000 mg | ORAL_CAPSULE | Freq: Two times a day (BID) | ORAL | Status: DC
  Filled 2011-07-28: qty 1

## 2011-07-28 MED ORDER — FENTANYL CITRATE 0.05 MG/ML IJ SOLN
INTRAMUSCULAR | Status: DC | PRN
  Administered 2011-07-28 (×2): 50 ug via INTRAVENOUS
  Administered 2011-07-28: 100 ug via INTRAVENOUS

## 2011-07-28 MED ORDER — GENTAMICIN IN SALINE 1-0.9 MG/ML-% IV SOLN
100.0000 mg | Freq: Once | INTRAVENOUS | Status: DC
  Filled 2011-07-28: qty 100

## 2011-07-28 MED ORDER — SODIUM CHLORIDE 0.9 % IV SOLN
INTRAVENOUS | Status: DC
  Administered 2011-07-28: 2.7 [IU]/h via INTRAVENOUS
  Filled 2011-07-28 (×2): qty 1

## 2011-07-28 MED ORDER — SODIUM CHLORIDE 0.9 % IV SOLN
1.0000 g | INTRAVENOUS | Status: DC
  Administered 2011-07-28: 1 g via INTRAVENOUS
  Filled 2011-07-28: qty 1

## 2011-07-28 MED ORDER — DEXTROSE 50 % IV SOLN
25.0000 mL | INTRAVENOUS | Status: DC | PRN
  Filled 2011-07-28: qty 50

## 2011-07-28 MED ORDER — SODIUM CHLORIDE 0.9 % IV BOLUS (SEPSIS)
1000.0000 mL | Freq: Once | INTRAVENOUS | Status: AC
  Administered 2011-07-28: 1000 mL via INTRAVENOUS

## 2011-07-28 SURGICAL SUPPLY — 26 items
BLADE HEX COATED 2.75 (ELECTRODE) ×2 IMPLANT
BLADE SURG 15 STRL LF DISP TIS (BLADE) ×1 IMPLANT
BLADE SURG 15 STRL SS (BLADE) ×1
CANISTER SUCTION 2500CC (MISCELLANEOUS) ×2 IMPLANT
CLOTH BEACON ORANGE TIMEOUT ST (SAFETY) ×2 IMPLANT
COVER SURGICAL LIGHT HANDLE (MISCELLANEOUS) ×2 IMPLANT
DRSG PAD ABDOMINAL 8X10 ST (GAUZE/BANDAGES/DRESSINGS) ×2 IMPLANT
ELECT REM PT RETURN 9FT ADLT (ELECTROSURGICAL) ×2
ELECTRODE REM PT RTRN 9FT ADLT (ELECTROSURGICAL) ×1 IMPLANT
GAUZE SPONGE 4X4 16PLY XRAY LF (GAUZE/BANDAGES/DRESSINGS) ×2 IMPLANT
GLOVE BIOGEL PI IND STRL 7.0 (GLOVE) ×1 IMPLANT
GLOVE BIOGEL PI INDICATOR 7.0 (GLOVE) ×1
GLOVE EUDERMIC 7 POWDERFREE (GLOVE) ×2 IMPLANT
GOWN STRL NON-REIN LRG LVL3 (GOWN DISPOSABLE) ×2 IMPLANT
GOWN STRL REIN XL XLG (GOWN DISPOSABLE) ×4 IMPLANT
LUBRICANT JELLY K Y 4OZ (MISCELLANEOUS) IMPLANT
NEEDLE HYPO 22GX1.5 SAFETY (NEEDLE) IMPLANT
PACK LITHOTOMY IV (CUSTOM PROCEDURE TRAY) ×2 IMPLANT
PENCIL BUTTON HOLSTER BLD 10FT (ELECTRODE) ×2 IMPLANT
SOL PREP PROV IODINE SCRUB 4OZ (MISCELLANEOUS) ×2 IMPLANT
SPONGE GAUZE 4X4 12PLY (GAUZE/BANDAGES/DRESSINGS) ×2 IMPLANT
SWAB COLLECTION DEVICE MRSA (MISCELLANEOUS) IMPLANT
SYR CONTROL 10ML LL (SYRINGE) IMPLANT
TOWEL OR 17X26 10 PK STRL BLUE (TOWEL DISPOSABLE) ×2 IMPLANT
UNDERPAD 30X30 INCONTINENT (UNDERPADS AND DIAPERS) ×2 IMPLANT
YANKAUER SUCT BULB TIP 10FT TU (MISCELLANEOUS) ×2 IMPLANT

## 2011-07-28 NOTE — ED Provider Notes (Signed)
History     CSN: [id number]  Arrival date & time 07/28/11  0940   First MD Initiated Contact with Patient 07/28/11 1113      Chief Complaint  Patient presents with  . Abscess  . Hyperglycemia    (Consider location/radiation/quality/duration/timing/severity/associated sxs/prior treatment) HPI Comments: Patient with history of diabetes and hypertension presents emergency Department with a chief complaint of groin abscess.  Onset of abscess was Thursday, symptoms have gradually been worsening, pain is rated at a 9/10-does not radiate and is described as a throbbing sensation, associated symptoms include fevers, night sweats, and chills.  Actual temperature was unknown because patient did not have a thermometer, however his growth and states that he was extremely warm. Abscesses currently actively draining.  Note that patient was recently evaluated in the emergency department for hyperglycemia and his metformin dose was increased to 1000 mg twice a day.  Patient states that he has been compliant on medication, but hyperglycemia persists.  Patient denies nausea, vomiting, diarrhea.  Patient is a 36 y.o. male presenting with abscess. The history is provided by the patient.  Abscess  Pertinent negatives include no fever.    Past Medical History  Diagnosis Date  . Diabetes mellitus   . Hypertension   . Priapism     Past Surgical History  Procedure Date  . Penectomy     No family history on file.  History  Substance Use Topics  . Smoking status: Current Some Day Smoker  . Smokeless tobacco: Not on file  . Alcohol Use: Yes     ocassionally      Review of Systems  Constitutional: Negative for fever, chills, diaphoresis and activity change.       Denies night sweats  HENT: Negative for neck stiffness.   Eyes: Negative for visual disturbance.  Respiratory: Negative for shortness of breath.   Cardiovascular: Negative for chest pain.  Gastrointestinal: Negative for abdominal  pain.  Genitourinary: Negative for dysuria, urgency and frequency.  Musculoskeletal: Negative for gait problem.  Skin: Negative for color change and rash.  Neurological: Negative for dizziness, light-headedness and headaches.  Hematological: Negative for adenopathy.  All other systems reviewed and are negative.    Allergies  Review of patient's allergies indicates no known allergies.  Home Medications   Current Outpatient Rx  Name Route Sig Dispense Refill  . HYDROCHLOROTHIAZIDE 25 MG PO TABS Oral Take 25 mg by mouth daily.    Marland Kitchen METFORMIN HCL 1000 MG PO TABS Oral Take 1 tablet (1,000 mg total) by mouth 2 (two) times daily with a meal. 60 tablet 5    BP 157/90  Pulse 94  Temp(Src) 99.8 F (37.7 C) (Oral)  Resp 20  SpO2 96%  Physical Exam  Nursing note and vitals reviewed. Constitutional: He is oriented to person, place, and time. He appears well-developed and well-nourished. He does not have a sickly appearance. He does not appear ill. No distress.  HENT:  Head: Normocephalic and atraumatic.  Eyes: Conjunctivae and EOM are normal.  Neck: Normal range of motion. Neck supple.  Cardiovascular: Normal rate and regular rhythm.   Pulmonary/Chest: Effort normal and breath sounds normal.  Musculoskeletal: He exhibits no edema.  Lymphadenopathy:       Head (right side): No submental, no preauricular and no posterior auricular adenopathy present.       Head (left side): No submental, no submandibular, no preauricular and no posterior auricular adenopathy present.    He has no axillary adenopathy.  Neurological: He  is alert and oriented to person, place, and time.  Skin: Skin is warm and dry. No rash noted. He is not diaphoretic.       4-5 cm sized abscess located on right groin. Extreme tenderness to palpation. Currently draining. Abscess is fluctuant with warmth, mild surrounding erythema.  Induration present, no rectal involvement.     ED Course  Procedures (including critical  care time)  Labs Reviewed  GLUCOSE, CAPILLARY - Abnormal; Notable for the following:    Glucose-Capillary 400 (*)    All other components within normal limits  CBC - Abnormal; Notable for the following:    WBC 24.5 (*)    RBC 6.39 (*)    Hemoglobin 12.7 (*)    HCT 38.1 (*)    MCV 59.6 (*)    MCH 19.9 (*)    All other components within normal limits  DIFFERENTIAL - Abnormal; Notable for the following:    Neutrophils Relative 84 (*)    Lymphocytes Relative 9 (*)    Neutro Abs 20.6 (*)    Monocytes Absolute 1.7 (*)    All other components within normal limits  BASIC METABOLIC PANEL - Abnormal; Notable for the following:    Sodium 127 (*)    Chloride 86 (*)    Glucose, Bld 427 (*)    All other components within normal limits   Ct Pelvis W Contrast  07/28/2011  *RADIOLOGY REPORT*  Clinical Data:  Abscess.  Hyperglycemia.  CT PELVIS WITH CONTRAST  Technique:  Multidetector CT imaging of the pelvis was performed using the standard protocol following the bolus administration of intravenous contrast.  Contrast:   100 ml Omnipaque 300  Comparison:   None.  Findings:  Diffuse inflammatory changes are noted along the right parasagittal perineum and into the posterior gluteal fold.  There is gas within the soft tissues as well, compatible with infection and abscess.  Mild edematous changes are noted on the left but without gas.  The edematous changes and gas extend to the base of the scrotum.  There is no gas within the scrotum or along the penis.  A rectal balloon is in place.  Contrast fills the visualized descending colon.  The appendix is visualized and normal. Atherosclerotic calcifications are present within the iliac vessels without aneurysm.  The urinary bladder is normal.  The visualized small bowel is normal.  There is no significant free fluid. Bilateral inguinal adenopathy is likely reactive.  The bone windows are unremarkable.  IMPRESSION:  1.  Extensive edematous changes, fluid and gas  within the right parasagittal perineum compatible with abscess.  This could represent early Fornier's gangrene. 2.  Multiple bilateral inguinal lymph nodes are likely reactive. 3.  No significant extension into the pelvis.  Original Report Authenticated By: Jamesetta Orleans. MATTERN, M.D.   No diagnosis found.  Pt infection concerning for fournier's gangrene or possible fistula. Pelvic CT w contrast ordered, pt started on clinda and discussed with Dr. Jeraldine Loots. Results pending for likely General Surgery admit.   MDM  Abscess possible early formers gangrene.  Gen. surgery consult in.  Patient started on clindamycin, gentamicin, and Flagyl IV.  Pain managed in the emergency department. Hyperglycemia treated with fluids. The patient appears reasonably stabilized for admission considering the current resources, flow, and capabilities available in the ED at this time, and I doubt any other [facility] requiring further screening and/or treatment in the ED prior to admission.         Jaci Carrel, [place] 07/28/11 [phone]

## 2011-07-28 NOTE — ED Notes (Signed)
Pt reports draining abscess between his rectal area and scrotum. Pt reports

## 2011-07-28 NOTE — H&P (Signed)
Reason for Consult:Perirectal abscess/Fourniers   Referring Physician: Jeraldine Loots (ER-Physician) No primary care doctor, gets medicines from ER   Frank Schneider is an 36 y.o. male.   HPI: The patient is a 36 year old African American male who was seen in the ER on 3/31 with a viral illness and uncontrolled diabetes. After his diabetes was controlled he was discharged home. He reports a small bump on his right perirectal area which started on Friday, 07/24/2010. He been treating it with local warm soaks. His become progressively worse. He's had some chills, and nausea. He's not been able to eat much. This become progressively worse and he  presented to the emergency room and [facility] this morning at 9 AM today. Exam showed a perirectal abscess. CT scan shows extensive edema this changes of fluid and gas within the right parasagittal perineum compatible with an abscess and could represent early Fournier's gangrene. WBC 24.5K, Glucose 427. We plan to admit the patient taken the operating room for incision drainage and debridement of infected tissue. Dr. Jomarie Longs the medical service we'll see in consultation    Past Medical History   Diagnosis  Date   .  Diabetes mellitus    3 admissions to ER with uncontrolled diabetes .  Hypertension     .  Priapism     .  Tobacco use         ETOH use    Past Surgical History   Procedure  Date   .  Penectomy  12 years ago        History reviewed. No pertinent family history.   Social History: reports that he has been smoking Cigarettes.  He has a 4 pack-year smoking history. He has never used smokeless tobacco. He reports that he drinks alcohol. He reports that he uses illicit drugs (Marijuana). Cigs: 1/2 PPD, ETOH 1-2 pints per week, Drugs: MJ  Unemployed Allergies: No Known Allergies   Medications:  I have reviewed the patient's current medications. Prior to Admission:  (Not in a hospital admission) Scheduled:   . clindamycin  (CLEOCIN) IV  600 mg Intravenous Once  . ertapenem  1 g Intravenous Q24H  . gentamicin  100 mg Intravenous Once  . metronidazole  500 mg Intravenous Once  . pneumococcal 23 valent vaccine  0.5 mL Intramuscular Tomorrow-1000  . sodium chloride  1,000 mL Intravenous Once  . vancomycin  1,000 mg Intravenous Once  . vancomycin  1,000 mg Intravenous Q8H   Continuous:   . sodium chloride     [id number]    Results for orders placed during the hospital encounter of 07/28/11 (from the past 48 hour(s))   GLUCOSE, CAPILLARY     Status: Abnormal     Collection Time     07/28/11 10:20 AM       Component  Value  Range  Comment     Glucose-Capillary  400 (*)  70 - 99 (mg/dL)     CBC     Status: Abnormal     Collection Time     07/28/11 12:05 PM       Component  Value  Range  Comment     WBC  24.5 (*)  4.0 - 10.5 (K/uL)       RBC  6.39 (*)  4.22 - 5.81 (MIL/uL)       Hemoglobin  12.7 (*)  13.0 - 17.0 (g/dL)       HCT  09.8 (*)  11.9 - 52.0 (%)  MCV  59.6 (*)  78.0 - 100.0 (fL)       MCH  19.9 (*)  26.0 - 34.0 (pg)       MCHC  33.3   30.0 - 36.0 (g/dL)       RDW  40.9   81.1 - 15.5 (%)       Platelets  235   150 - 400 (K/uL)     DIFFERENTIAL     Status: Abnormal     Collection Time     07/28/11 12:05 PM       Component  Value  Range  Comment     Neutrophils Relative  84 (*)  43 - 77 (%)       Lymphocytes Relative  9 (*)  12 - 46 (%)       Monocytes Relative  7   3 - 12 (%)       Eosinophils Relative  0   0 - 5 (%)       Basophils Relative  0   0 - 1 (%)       Neutro Abs  20.6 (*)  1.7 - 7.7 (K/uL)       Lymphs Abs  2.2   0.7 - 4.0 (K/uL)       Monocytes Absolute  1.7 (*)  0.1 - 1.0 (K/uL)       Eosinophils Absolute  0.0   0.0 - 0.7 (K/uL)       Basophils Absolute  0.0   0.0 - 0.1 (K/uL)       RBC Morphology  TARGET CELLS          WBC Morphology  MILD LEFT SHIFT (1-5% METAS, OCC MYELO, OCC BANDS)          Smear Review  PLATELET COUNT CONFIRMED BY SMEAR        BASIC METABOLIC PANEL      Status: Abnormal     Collection Time     07/28/11 12:05 PM       Component  Value  Range  Comment     Sodium  127 (*)  135 - 145 (mEq/L)       Potassium  4.7   3.5 - 5.1 (mEq/L)       Chloride  86 (*)  96 - 112 (mEq/L)       CO2  25   19 - 32 (mEq/L)       Glucose, Bld  427 (*)  70 - 99 (mg/dL)       BUN  11   6 - 23 (mg/dL)       Creatinine, Ser  0.95   0.50 - 1.35 (mg/dL)       Calcium  91.4   8.4 - 10.5 (mg/dL)       GFR calc non Af Amer  >90   >90 (mL/min)       GFR calc Af Amer  >90   >90 (mL/min)        Ct Pelvis W Contrast   07/28/2011  *RADIOLOGY REPORT*  Clinical Data:  Abscess.  Hyperglycemia.  CT PELVIS WITH CONTRAST  Technique:  Multidetector CT imaging of the pelvis was performed using the standard protocol following the bolus administration of intravenous contrast.  Contrast:   100 ml Omnipaque 300  Comparison:   None.  Findings:  Diffuse inflammatory changes are noted along the right parasagittal perineum and into the posterior gluteal fold.  There is gas within the soft tissues  as well, compatible with infection and abscess.  Mild edematous changes are noted on the left but without gas.  The edematous changes and gas extend to the base of the scrotum.  There is no gas within the scrotum or along the penis.  A rectal balloon is in place.  Contrast fills the visualized descending colon.  The appendix is visualized and normal. Atherosclerotic calcifications are present within the iliac vessels without aneurysm.  The urinary bladder is normal.  The visualized small bowel is normal.  There is no significant free fluid. Bilateral inguinal adenopathy is likely reactive.  The bone windows are unremarkable.  IMPRESSION:  1.  Extensive edematous changes, fluid and gas within the right parasagittal perineum compatible with abscess.  This could represent early Fornier's gangrene. 2.  Multiple bilateral inguinal lymph nodes are likely reactive. 3.  No significant extension into the pelvis.   Original Report Authenticated By: Jamesetta Orleans. MATTERN, M.D.     Review of Systems  Constitutional: Positive for fever and chills. Negative for weight loss, malaise/fatigue and diaphoresis.  HENT: Negative.   Eyes: Negative.   Respiratory: Negative.   Cardiovascular: Negative.   Gastrointestinal: Positive for nausea and diarrhea (Just started having diarrhea.).  Musculoskeletal: Negative.   Skin:        He has a large area right perirectal abscess which started Friday 07/24/11 as a small bump   Neurological: Negative.  Negative for weakness.  Endo/Heme/Allergies: Negative.   Psychiatric/Behavioral: Negative.   Blood pressure 157/90, pulse 94, temperature 99.8 F (37.7 C), temperature source Oral, resp. rate 20, height 5' 10.47" (1.79 m), weight 86.2 kg (190 lb 0.6 oz), SpO2 96.00%. Physical Exam  Constitutional: He is oriented to person, place, and time. He appears well-developed and well-nourished. No distress.  HENT:   Head: Normocephalic and atraumatic.   Nose: Nose normal.  Eyes: Conjunctivae and EOM are normal. Pupils are equal, round, and reactive to light. No scleral icterus.  Neck: Normal range of motion. Neck supple. No JVD present. No tracheal deviation present. No thyromegaly present.  Cardiovascular: Normal rate, regular rhythm and intact distal pulses.  Exam reveals no gallop and no friction rub.    No murmur heard. Respiratory: Effort normal and breath sounds normal. No respiratory distress. He has no wheezes. He has no rales. He exhibits no tenderness.  GI: Soft. Bowel sounds are normal. He exhibits no distension. There is no tenderness. There is no rebound and no guarding.  Genitourinary: Rectum normal and penis normal.       He has a large perirectal abscess about 10-12 cm long along his gluteal fold, about 4-5 cm wide.  It has opened and is blistered in one portion of the site.  Still very inflamed and edematous.  Penis, scrotum and testes are not currently  involved.  Musculoskeletal: Normal range of motion. He exhibits no edema and no tenderness.  Neurological: He is alert and oriented to person, place, and time. He has normal reflexes. No cranial nerve deficit.  Skin: Skin is warm and dry. No rash noted. No erythema.  Psychiatric: He has a normal mood and affect. His behavior is normal. Judgment and thought content normal.    Assessment/Plan: 1.Perirectal abscess/possible early Fournier's gangrene 2.Uncontrolled diabetes 5 years or more 3.Hypertension 4.ETOH use 5. Tobacco use   Plan:  Admit, IV Vancomycin and Invanz, Plan OR later today.  Medicine to manage diabetes, polysubstance use, hypertension        Frank Schneider 07/28/2011, 2:55 PM

## 2011-07-28 NOTE — H&P (Deleted)
Reason for Consult:Perirectal abscess/Fourniers   Referring Physician: Jeraldine Loots (ER-Physician) No primary care doctor, gets medicines from ER   Frank Schneider is an 36 y.o. male.   HPI: The patient is a 36 year old African American male who was seen in the ER on 3/31 with a viral illness and uncontrolled diabetes. After his diabetes was controlled he was discharged home. He reports a small bump on his right perirectal area which started on Friday, 07/24/2010. He been treating it with local warm soaks. His become progressively worse. He's had some chills, and nausea. He's not been able to eat much. This become progressively worse and he  presented to the emergency room and [facility] this morning at 9 AM today. Exam showed a perirectal abscess. CT scan shows extensive edema this changes of fluid and gas within the right parasagittal perineum compatible with an abscess and could represent early Fournier's gangrene. WBC 24.5K, Glucose 427. We plan to admit the patient taken the operating room for incision drainage and debridement of infected tissue. Dr. Jomarie Longs the medical service we'll see in consultation    Past Medical History   Diagnosis  Date   .  Diabetes mellitus    He has at least 3 visits now with uncontrolled diabetes, .  Hypertension     .  Priapism     .  Tobacco use     ETOH Use    Past Surgical History   Procedure  Date   .  Penectomy  12 years ago        History reviewed. No pertinent family history.   Social History: reports that he has been smoking Cigarettes.  He has a 4 pack-year smoking history. He has never used smokeless tobacco. He reports that he drinks alcohol. He reports that he uses illicit drugs (Marijuana). Cigs: 1/2 PPD, ETOH 1-2 pints per week, Drugs: MJ  Unemployed Allergies: No Known Allergies   Medications:  Prior to Admission:  (Not in a hospital admission) Scheduled:   . clindamycin (CLEOCIN) IV  600 mg Intravenous Once  . ertapenem  1  g Intravenous Q24H  . gentamicin  100 mg Intravenous Once  . metronidazole  500 mg Intravenous Once  . pneumococcal 23 valent vaccine  0.5 mL Intramuscular Tomorrow-1000  . sodium chloride  1,000 mL Intravenous Once  . vancomycin  1,000 mg Intravenous Once  . vancomycin  1,000 mg Intravenous Q8H   Continuous:   . sodium chloride     WUJ:[id number] Anti-infectives     Start     Dose/Rate Route Frequency Ordered Stop   07/28/11 2200   vancomycin (VANCOCIN) IVPB 1000 mg/200 mL premix        1,000 mg 200 mL/hr over 60 Minutes Intravenous Every 8 hours 07/28/11 1445     07/28/11 1500   vancomycin (VANCOCIN) IVPB 1000 mg/200 mL premix        1,000 mg 200 mL/hr over 60 Minutes Intravenous  Once 07/28/11 1432     07/28/11 1445   ertapenem (INVANZ) 1 g in sodium chloride 0.9 % 50 mL IVPB        1 g 100 mL/hr over 30 Minutes Intravenous Every 24 hours 07/28/11 1432     07/28/11 1430   gentamicin (GARAMYCIN) IVPB 100 mg        100 mg 200 mL/hr over 30 Minutes Intravenous  Once 07/28/11 1344     07/28/11 1345   metroNIDAZOLE (FLAGYL) IVPB 500 mg  500 mg 100 mL/hr over 60 Minutes Intravenous  Once 07/28/11 1344     07/28/11 1200   clindamycin (CLEOCIN) IVPB 600 mg        600 mg 100 mL/hr over 30 Minutes Intravenous  Once 07/28/11 1152 07/28/11 1326            Results for orders placed during the hospital encounter of 07/28/11 (from the past 48 hour(s))   GLUCOSE, CAPILLARY     Status: Abnormal     Collection Time     07/28/11 10:20 AM       Component  Value  Range  Comment     Glucose-Capillary  400 (*)  70 - 99 (mg/dL)     CBC     Status: Abnormal     Collection Time     07/28/11 12:05 PM       Component  Value  Range  Comment     WBC  24.5 (*)  4.0 - 10.5 (K/uL)       RBC  6.39 (*)  4.22 - 5.81 (MIL/uL)       Hemoglobin  12.7 (*)  13.0 - 17.0 (g/dL)       HCT  91.4 (*)  78.2 - 52.0 (%)       MCV  59.6 (*)  78.0 - 100.0 (fL)       MCH  19.9 (*)  26.0 - 34.0 (pg)        MCHC  33.3   30.0 - 36.0 (g/dL)       RDW  95.6   21.3 - 15.5 (%)       Platelets  235   150 - 400 (K/uL)     DIFFERENTIAL     Status: Abnormal     Collection Time     07/28/11 12:05 PM       Component  Value  Range  Comment     Neutrophils Relative  84 (*)  43 - 77 (%)       Lymphocytes Relative  9 (*)  12 - 46 (%)       Monocytes Relative  7   3 - 12 (%)       Eosinophils Relative  0   0 - 5 (%)       Basophils Relative  0   0 - 1 (%)       Neutro Abs  20.6 (*)  1.7 - 7.7 (K/uL)       Lymphs Abs  2.2   0.7 - 4.0 (K/uL)       Monocytes Absolute  1.7 (*)  0.1 - 1.0 (K/uL)       Eosinophils Absolute  0.0   0.0 - 0.7 (K/uL)       Basophils Absolute  0.0   0.0 - 0.1 (K/uL)       RBC Morphology  TARGET CELLS          WBC Morphology  MILD LEFT SHIFT (1-5% METAS, OCC MYELO, OCC BANDS)          Smear Review  PLATELET COUNT CONFIRMED BY SMEAR        BASIC METABOLIC PANEL     Status: Abnormal     Collection Time     07/28/11 12:05 PM       Component  Value  Range  Comment     Sodium  127 (*)  135 - 145 (mEq/L)       Potassium  4.7  3.5 - 5.1 (mEq/L)       Chloride  86 (*)  96 - 112 (mEq/L)       CO2  25   19 - 32 (mEq/L)       Glucose, Bld  427 (*)  70 - 99 (mg/dL)       BUN  11   6 - 23 (mg/dL)       Creatinine, Ser  0.95   0.50 - 1.35 (mg/dL)       Calcium  78.2   8.4 - 10.5 (mg/dL)       GFR calc non Af Amer  >90   >90 (mL/min)       GFR calc Af Amer  >90   >90 (mL/min)        Ct Pelvis W Contrast   07/28/2011  *RADIOLOGY REPORT*  Clinical Data:  Abscess.  Hyperglycemia.  CT PELVIS WITH CONTRAST  Technique:  Multidetector CT imaging of the pelvis was performed using the standard protocol following the bolus administration of intravenous contrast.  Contrast:   100 ml Omnipaque 300  Comparison:   None.  Findings:  Diffuse inflammatory changes are noted along the right parasagittal perineum and into the posterior gluteal fold.  There is gas within the soft tissues as well, compatible with  infection and abscess.  Mild edematous changes are noted on the left but without gas.  The edematous changes and gas extend to the base of the scrotum.  There is no gas within the scrotum or along the penis.  A rectal balloon is in place.  Contrast fills the visualized descending colon.  The appendix is visualized and normal. Atherosclerotic calcifications are present within the iliac vessels without aneurysm.  The urinary bladder is normal.  The visualized small bowel is normal.  There is no significant free fluid. Bilateral inguinal adenopathy is likely reactive.  The bone windows are unremarkable.  IMPRESSION:  1.  Extensive edematous changes, fluid and gas within the right parasagittal perineum compatible with abscess.  This could represent early Fornier's gangrene. 2.  Multiple bilateral inguinal lymph nodes are likely reactive. 3.  No significant extension into the pelvis.  Original Report Authenticated By: Jamesetta Orleans. MATTERN, M.D.     Review of Systems  Constitutional: Positive for fever and chills. Negative for weight loss, malaise/fatigue and diaphoresis.  HENT: Negative.   Eyes: Negative.   Respiratory: Negative.   Cardiovascular: Negative.   Gastrointestinal: Positive for nausea and diarrhea (Just started having diarrhea.).  Musculoskeletal: Negative.   Skin:        He has a large area right perirectal abscess which started Friday 07/24/11 as a small bump   Neurological: Negative.  Negative for weakness.  Endo/Heme/Allergies: Negative.   Psychiatric/Behavioral: Negative.   Blood pressure 157/90, pulse 94, temperature 99.8 F (37.7 C), temperature source Oral, resp. rate 20, height 5' 10.47" (1.79 m), weight 86.2 kg (190 lb 0.6 oz), SpO2 96.00%. Physical Exam  Constitutional: He is oriented to person, place, and time. He appears well-developed and well-nourished. No distress.  HENT:   Head: Normocephalic and atraumatic.   Nose: Nose normal.  Eyes: Conjunctivae and EOM are normal.  Pupils are equal, round, and reactive to light. No scleral icterus.  Neck: Normal range of motion. Neck supple. No JVD present. No tracheal deviation present. No thyromegaly present.  Cardiovascular: Normal rate, regular rhythm and intact distal pulses.  Exam reveals no gallop and no friction rub.    No murmur heard. Respiratory:  Effort normal and breath sounds normal. No respiratory distress. He has no wheezes. He has no rales. He exhibits no tenderness.  GI: Soft. Bowel sounds are normal. He exhibits no distension. There is no tenderness. There is no rebound and no guarding.  Genitourinary: Rectum normal and penis normal.       He has a large perirectal abscess about 10-12 cm long along his gluteal fold, about 4-5 cm wide.  It has opened and is blistered in one portion of the site.  Still very inflamed and edematous.  Penis, scrotum and testes are not currently involved.  Musculoskeletal: Normal range of motion. He exhibits no edema and no tenderness.  Neurological: He is alert and oriented to person, place, and time. He has normal reflexes. No cranial nerve deficit.  Skin: Skin is warm and dry. No rash noted. No erythema.  Psychiatric: He has a normal mood and affect. His behavior is normal. Judgment and thought content normal.    Assessment/Plan: 1.Perirectal abscess/possible early Fournier's gangrene 2.Uncontrolled diabetes 5 years or more 3.Hypertension 4.ETOH use 5. Tobacco use   Plan:  Admit, IV Vancomycin and Invanz, Plan OR later today.  Medicine to manage diabetes, polysubstance use, hypertension        Brandolyn Shortridge 07/28/2011, 2:55 PM

## 2011-07-28 NOTE — H&P (Signed)
General surgery attending note  I have personally interviewed and examined this patient. I agree with the evaluation and treatment plan outlined by Mr. Marlyne Beards, [place]  He has a complex perirectal abscess in the right gluteal area right anterior and right posterior. This does not involve the sphincters and does not involve the testicle or scrotum at this time. This may be a Fournier's gangrene. He will need to be taken to the operating room promptly this evening for debridement.  I discussed the indications and details of surgery with the patient. I discussed techniques of surgery, wound care and multiple risks involved. His questions are answered. He understands these issues. He agrees with this plan.   Angelia Mould. Derrell Lolling, M.D., [facility] General and Minimally invasive Surgery Breast and Colorectal Surgery Office:   [phone] Pager:   [phone]

## 2011-07-28 NOTE — Anesthesia Preprocedure Evaluation (Addendum)
Anesthesia Evaluation  Patient identified by MRN, date of birth, ID band Patient awake    Reviewed: Allergy & Precautions, H&P , NPO status , Patient's Chart, lab work & pertinent test results  Airway Mallampati: II TM Distance: >3 FB Neck ROM: Full    Dental No notable dental hx.    Pulmonary Current Smoker,  breath sounds clear to auscultation  Pulmonary exam normal       Cardiovascular hypertension, Rhythm:Regular Rate:Normal     Neuro/Psych negative neurological ROS  negative psych ROS   GI/Hepatic negative GI ROS, Neg liver ROS,   Endo/Other  Diabetes mellitus-, Poorly Controlled  Renal/GU negative Renal ROS  negative genitourinary   Musculoskeletal negative musculoskeletal ROS (+)   Abdominal   Peds negative pediatric ROS (+)  Hematology negative hematology ROS (+)   Anesthesia Other Findings   Reproductive/Obstetrics negative OB ROS                          Anesthesia Physical Anesthesia Plan  ASA: III and Emergent  Anesthesia Plan: General   Post-op Pain Management:    Induction: Intravenous  Airway Management Planned: Oral ETT  Additional Equipment:   Intra-op Plan:   Post-operative Plan: Extubation in OR  Informed Consent: I have reviewed the patients History and Physical, chart, labs and discussed the procedure including the risks, benefits and alternatives for the proposed anesthesia with the patient or authorized representative who has indicated his/her understanding and acceptance.   Dental advisory given  Plan Discussed with: CRNA  Anesthesia Plan Comments:         Anesthesia Quick Evaluation

## 2011-07-28 NOTE — Transfer of Care (Signed)
Immediate Anesthesia Transfer of Care Note  Patient: Frank Schneider  Procedure(s) Performed: Procedure(s) (LRB): IRRIGATION AND DEBRIDEMENT PERIRECTAL ABSCESS (N/A)  Patient Location: PACU  Anesthesia Type: General  Level of Consciousness: awake, sedated and patient cooperative  Airway & Oxygen Therapy: Patient Spontanous Breathing and Patient connected to face mask oxygen  Post-op Assessment: Report given to PACU RN and Post -op Vital signs reviewed and stable  Post vital signs: Reviewed and stable  Complications: No apparent anesthesia complications

## 2011-07-28 NOTE — Consult Note (Signed)
Requesting physician: Dr.Ingram  Primary Care Physician: None  Reason for consultation: Uncontrolled DM   History of Present Illness: [name] is a 36 year old African American gentleman with history of hypertension and diabetes was seen in the ER last week with a viral illness in uncontrolled diabetes he was treated with some insulin and fluids and discharged home. He recalls noticing a boil in his perirectal area on Friday which was treated with warm compresses, this subsequently became very extensive rapidly and presented to the ER today where he had a CT scan done which showed perirectal abscess with changes of fluid and gas concerning for early Fournier's gangrene. He is being seen by Dr. Derrell Lolling with [facility] surgery with a plan for surgical debridement today. His CBG was found to be 427 and [facility] were consulted for this. Patient reports taking metformin twice a day which he has been getting through the emergency room. He reports that the last time he saw a primary care physician was approximately 2 years ago at [facility].  Allergies:  No Known Allergies    Past Medical History  Diagnosis Date  . Diabetes mellitus   . Hypertension   . Priapism   . Tobacco use     Past Surgical History  Procedure Date  . Penectomy     Scheduled Meds:   . clindamycin (CLEOCIN) IV  600 mg Intravenous Once  . docusate sodium  100 mg Oral BID  . ertapenem  1 g Intravenous Q24H  . insulin regular  0-10 Units Intravenous TID WC  . pantoprazole (PROTONIX) IV  40 mg Intravenous QHS  . pneumococcal 23 valent vaccine  0.5 mL Intramuscular Tomorrow-1000  . sodium chloride  1,000 mL Intravenous Once  . vancomycin  1,000 mg Intravenous Once  . vancomycin  1,000 mg Intravenous Q8H  . DISCONTD: gentamicin  100 mg Intravenous Once  . DISCONTD: metronidazole  500 mg Intravenous Once   Continuous Infusions:   . sodium chloride    . dextrose 5 % and 0.45% NaCl    . insulin  (NOVOLIN-R) infusion 2.7 Units/hr (07/28/11 1554)  . DISCONTD: sodium chloride    . DISCONTD: sodium chloride     PRN Meds:.acetaminophen, acetaminophen, dextrose, iohexol, morphine, ondansetron  Social History:  single, currently unemployed, smokes half pack per day for the last 15 years Alcohol drinks 3-4 hard drinks per week Uses marijuana approximately once a week Currently lives with some friends   Review of Systems:  Constitutional: Denies fever, chills, diaphoresis, appetite change and fatigue.  HEENT: Denies photophobia, eye pain, redness, hearing loss, ear pain, congestion, sore throat, rhinorrhea, sneezing, mouth sores, trouble swallowing, neck pain, neck stiffness and tinnitus.   Respiratory: Denies SOB, DOE, cough, chest tightness,  and wheezing.   Cardiovascular: Denies chest pain, palpitations and leg swelling.  Gastrointestinal: Denies nausea, vomiting, abdominal pain, diarrhea, constipation, blood in stool and abdominal distention.  Genitourinary: Denies dysuria, urgency, frequency, hematuria, flank pain and difficulty urinating.  Musculoskeletal: Denies myalgias, back pain, joint swelling, arthralgias and gait problem.  Skin: Denies pallor, rash and wound.  Neurological: Denies dizziness, seizures, syncope, weakness, light-headedness, numbness and headaches.  Hematological: Denies adenopathy. Easy bruising, personal or family bleeding history  Psychiatric/Behavioral: Denies suicidal ideation, mood changes, confusion, nervousness, sleep disturbance and agitation   Physical Exam: Blood pressure 143/85, pulse 89, temperature 99.4 F (37.4 C), temperature source Oral, resp. rate 20, height 5\' 10"  (1.778 m), weight 86.183 kg (190 lb), SpO2 99.00%. General exam: Average built African  American gentleman in no acute distress HEENT pupils equal in the right JVD lymphadenopathy extensive CVS regular rate rhythm no murmurs rubs or gallops exam  lungs clear auscultation  bilaterally  abdomen soft nontender with normal bowel sounds no organomegaly Genitourinary: Large perirectal abscess about 15 cm long and 5-6 cm wide. With evidence of blistering and open ulcer. Extremely erythematous and tender  Labs on Admission:  Results for orders placed during the hospital encounter of 07/28/11 (from the past 48 hour(s))  GLUCOSE, CAPILLARY     Status: Abnormal   Collection Time   07/28/11 10:20 AM      Component Value Range Comment   Glucose-Capillary 400 (*) 70 - 99 (mg/dL)   CBC     Status: Abnormal   Collection Time   07/28/11 12:05 PM      Component Value Range Comment   WBC 24.5 (*) 4.0 - 10.5 (K/uL)    RBC 6.39 (*) 4.22 - 5.81 (MIL/uL)    Hemoglobin 12.7 (*) 13.0 - 17.0 (g/dL)    HCT 16.1 (*) 09.6 - 52.0 (%)    MCV 59.6 (*) 78.0 - 100.0 (fL)    MCH 19.9 (*) 26.0 - 34.0 (pg)    MCHC 33.3  30.0 - 36.0 (g/dL)    RDW 04.5  40.9 - 81.1 (%)    Platelets 235  150 - 400 (K/uL)   DIFFERENTIAL     Status: Abnormal   Collection Time   07/28/11 12:05 PM      Component Value Range Comment   Neutrophils Relative 84 (*) 43 - 77 (%)    Lymphocytes Relative 9 (*) 12 - 46 (%)    Monocytes Relative 7  3 - 12 (%)    Eosinophils Relative 0  0 - 5 (%)    Basophils Relative 0  0 - 1 (%)    Neutro Abs 20.6 (*) 1.7 - 7.7 (K/uL)    Lymphs Abs 2.2  0.7 - 4.0 (K/uL)    Monocytes Absolute 1.7 (*) 0.1 - 1.0 (K/uL)    Eosinophils Absolute 0.0  0.0 - 0.7 (K/uL)    Basophils Absolute 0.0  0.0 - 0.1 (K/uL)    RBC Morphology TARGET CELLS      WBC Morphology MILD LEFT SHIFT (1-5% METAS, OCC MYELO, OCC BANDS)      Smear Review PLATELET COUNT CONFIRMED BY SMEAR     BASIC METABOLIC PANEL     Status: Abnormal   Collection Time   07/28/11 12:05 PM      Component Value Range Comment   Sodium 127 (*) 135 - 145 (mEq/L)    Potassium 4.7  3.5 - 5.1 (mEq/L)    Chloride 86 (*) 96 - 112 (mEq/L)    CO2 25  19 - 32 (mEq/L)    Glucose, Bld 427 (*) 70 - 99 (mg/dL)    BUN 11  6 - 23 (mg/dL)     Creatinine, Ser 9.14  0.50 - 1.35 (mg/dL)    Calcium 78.2  8.4 - 10.5 (mg/dL)    GFR calc non Af Amer >90  >90 (mL/min)    GFR calc Af Amer >90  >90 (mL/min)   LACTIC ACID, PLASMA     Status: Normal   Collection Time   07/28/11  2:20 PM      Component Value Range Comment   Lactic Acid, Venous 1.3  0.5 - 2.2 (mmol/L)   HEPATIC FUNCTION PANEL     Status: Abnormal   Collection Time  07/28/11  2:20 PM      Component Value Range Comment   Total Protein 7.4  6.0 - 8.3 (g/dL)    Albumin 3.2 (*) 3.5 - 5.2 (g/dL)    AST 12  0 - 37 (U/L)    ALT 13  0 - 53 (U/L)    Alkaline Phosphatase 95  39 - 117 (U/L)    Total Bilirubin 0.6  0.3 - 1.2 (mg/dL)    Bilirubin, Direct 0.2  0.0 - 0.3 (mg/dL)    Indirect Bilirubin 0.4  0.3 - 0.9 (mg/dL)   MAGNESIUM     Status: Normal   Collection Time   07/28/11  2:20 PM      Component Value Range Comment   Magnesium 1.8  1.5 - 2.5 (mg/dL)   APTT     Status: Normal   Collection Time   07/28/11  2:20 PM      Component Value Range Comment   aPTT 31  24 - 37 (seconds)   PROTIME-INR     Status: Normal   Collection Time   07/28/11  2:20 PM      Component Value Range Comment   Prothrombin Time 15.1  11.6 - 15.2 (seconds)    INR 1.17  0.00 - 1.49    GLUCOSE, CAPILLARY     Status: Abnormal   Collection Time   07/28/11  3:44 PM      Component Value Range Comment   Glucose-Capillary 329 (*) 70 - 99 (mg/dL)    Comment 1 Documented in Chart       Radiological Exams on Admission: Ct Pelvis W Contrast  07/28/2011  *RADIOLOGY REPORT*  Clinical Data:  Abscess.  Hyperglycemia.  CT PELVIS WITH CONTRAST  Technique:  Multidetector CT imaging of the pelvis was performed using the standard protocol following the bolus administration of intravenous contrast.  Contrast:   100 ml Omnipaque 300  Comparison:   None.  Findings:  Diffuse inflammatory changes are noted along the right parasagittal perineum and into the posterior gluteal fold.  There is gas within the soft tissues as well,  compatible with infection and abscess.  Mild edematous changes are noted on the left but without gas.  The edematous changes and gas extend to the base of the scrotum.  There is no gas within the scrotum or along the penis.  A rectal balloon is in place.  Contrast fills the visualized descending colon.  The appendix is visualized and normal. Atherosclerotic calcifications are present within the iliac vessels without aneurysm.  The urinary bladder is normal.  The visualized small bowel is normal.  There is no significant free fluid. Bilateral inguinal adenopathy is likely reactive.  The bone windows are unremarkable.  IMPRESSION:  1.  Extensive edematous changes, fluid and gas within the right parasagittal perineum compatible with abscess.  This could represent early Fornier's gangrene. 2.  Multiple bilateral inguinal lymph nodes are likely reactive. 3.  No significant extension into the pelvis.  Original Report Authenticated By: Jamesetta Orleans. MATTERN, M.D.    Assessment/Plan 1. Perirectal abscess with early Fournier's gangrene And agree with vancomycin and carbapenem, possibly change to Imipenem. I will add Clindamycin for its antitoxin effects  Urgent Surgical debridement per [facility] FU cultures from OR 2. [id number], start Insulin drip using glucomander protocol peri-operatively to achieve rapid glucose control. Based on insulin requirements through the day will transition to long acting insulin with NovoLog sliding scale tomorrow Check hemoglobin A1c Needs to get re-established with PCP 3.  Hypertension: Stable, hold off on medications at this time.  4. polysubstance abuse: Will need counseling once acute illness subsides.    Time Spent on Consultation:  Ashwath Lasch [facility]  [phone] 07/28/2011, 3:58 PM

## 2011-07-28 NOTE — Progress Notes (Signed)
Insulin infusion turned off for procedure. glc 209 at 1745

## 2011-07-28 NOTE — ED Notes (Signed)
Pt reports blood sugar has been high lately and was recently seen at the hospital for High blood sugar. Today blood sugar is 400.

## 2011-07-28 NOTE — ED Notes (Signed)
Graham crackers given to pt 's family at bedside as requested.  Pt is resting comfortably.

## 2011-07-28 NOTE — Anesthesia Postprocedure Evaluation (Signed)
  Anesthesia Post-op Note  Patient: Theme park manager  Procedure(s) Performed: Procedure(s) (LRB): IRRIGATION AND DEBRIDEMENT PERIRECTAL ABSCESS (N/A)  Patient Location: PACU  Anesthesia Type: General  Level of Consciousness: awake and alert   Airway and Oxygen Therapy: Patient Spontanous Breathing  Post-op Pain: mild  Post-op Assessment: Post-op Vital signs reviewed, Patient's Cardiovascular Status Stable, Respiratory Function Stable, Patent Airway and No signs of Nausea or vomiting  Post-op Vital Signs: stable  Complications: No apparent anesthesia complications

## 2011-07-28 NOTE — Progress Notes (Signed)
CM spoke with pt who confirms she self pay [place] resident.  CM discussed and provided written information for self pay pcps, importance of pcp for f/u care, [facility] contact number and other [place] resources Pt voiced understanding and appreciative of resources- [facility] referral completed

## 2011-07-28 NOTE — ED Provider Notes (Signed)
Medical screening examination/treatment/procedure(s) were conducted as a shared visit with non-physician practitioner(s) and myself.  I personally evaluated the patient during the encounter Young male with diabetes now presents with concerns of hyperglycemia and a right perineal abscess.  On my exam the patient was in no distress, though the abscess was notably enlarged and actively draining pus.  The patient's diabetes and the location of his lesion of concern for Fournier's gangrene.  This was demonstrated on a CT scan, which was reviewed by me.  The patient's case was discussed by me with the surgery team.  The patient was admitted to their service following the completion of antibiotics in the emergency department for operative care.    Gerhard Munch, MD 07/28/11 1606

## 2011-07-28 NOTE — ED Notes (Signed)
Pt c/o of abscess to lower buttocks/groin area that is painful 8/10. Pt reports drainage from site.

## 2011-07-28 NOTE — Progress Notes (Signed)
ANTIBIOTIC CONSULT NOTE - INITIAL  Pharmacy Consult for Vancomycin Indication: Groin abscess c/w Fournier's gangrene   No Known Allergies  Patient Measurements: Height: 5' 10.47" (179 cm) Weight: 190 lb 0.6 oz (86.2 kg) IBW/kg (Calculated) : 74.09   Vital Signs: Temp: 99.8 F (37.7 C) (04/02 1001) Temp src: Oral (04/02 1001) BP: 157/90 mmHg (04/02 1217) Pulse Rate: 94  (04/02 1217) Intake/Output from previous day:   Intake/Output from this shift:    Labs:  Basename 07/28/11 1205 07/26/11 1532 07/26/11 1335  WBC 24.5* -- --  HGB 12.7* 14.3 --  PLT 235 -- --  LABCREA -- -- --  CREATININE 0.95 1.00 0.88   Estimated Creatinine Clearance: 113.8 ml/min (by C-G formula based on Cr of 0.95). No results found for this basename: VANCOTROUGH:2,VANCOPEAK:2,VANCORANDOM:2,GENTTROUGH:2,GENTPEAK:2,GENTRANDOM:2,TOBRATROUGH:2,TOBRAPEAK:2,TOBRARND:2,AMIKACINPEAK:2,AMIKACINTROU:2,AMIKACIN:2, in the last 72 hours   Assessment:  27 YOM with diabetes presented with groin abscess represent early Fournier's gangrene per CT or possible fistula. Surgery consulted and ordered to start Vancomycin on top of Ertapenem.  Patient received Clinda 600 mg IV x 1 in the ED  Has ordered for Gentamicin x 1 and Flagyl x 1, Vanc 1gm x 1 but not yet given  Good renal function, leukocytosis  No cultures as of yet  Goal of Therapy:  Vancomycin trough level 15-20 mcg/ml  Plan:   Vancomycin 1gm IV q8h  Continue ertapenem 1gm IV q24 as ordered previously  Ertapenem has anaerobic coverage so rec to d/c Flagyl.   Consider psuedomonas coverage (broaden to imipenem?)  Would check vanc trough at steady state.   Geoffry Paradise Thi 07/28/2011,2:40 PM

## 2011-07-28 NOTE — ED Notes (Signed)
MD at bedside.  Dr. Derrell Lolling [facility] at bedside.

## 2011-07-28 NOTE — Op Note (Signed)
Patient Name:           Frank Schneider   Date of Surgery:        07/28/2011  Pre op Diagnosis:      Fournier's gangrene of the perineum  Post op Diagnosis:    same  Procedure:                 Debridement of skin, subcutaneous tissue, and muscle of the perineum, 8 cm x 24 cm dimension  Surgeon:                     Angelia Mould. Derrell Lolling, M.D., FACS  Assistant:                      none  Operative Indications:   This is a 36 year old African American male with uncontrolled diabetes. He reports a small bump on  his right perirectal area which started on Friday, 07/24/2010 which she's been treating with warm soaks. He was seen in the emergency room on March 31 and was sent home. The area in his right perirectal area became progressively worse and he presented to the emergency room at [facility] today. Exam showed a complex abscess with blistering  and foul odor on the right side. CT scan showed extensive edema with fluid and gas and this area of the perineum in the right anterior position. WBC 24,000. Glucose 427. We admitted the patient, start him on antibiotics, and asked the medical service to control his diabetes, and he is brought to the operating room urgently.  Operative Findings:       The patient had a complex soft-tissue infection in the perineum. This was on the right side anterior to the rectum and not involving the rectum. It extended up to the base of the penis and the base of the scrotum but really did not extend into the scrotum or the  testicle or the penis. There was foul-smelling soupy brown drainage and necrotic skin subcutaneous tissue and muscle all of which was debridement back to healthy bleeding tissue.  Procedure in Detail:          Following the induction of general endotracheal anesthesia the patient was positioned in a dorsal lithotomy position and rigid padded stirrups. Surgical time out was performed. The entire perineum and genitalia were prepped and draped in a sterile  fashion.  I observed an open area anteriorly near the base of the scrotum. This was extensively debrided. There was. Purulent material extending more posteriorly. Aerobic and anaerobic cultures were taken. I debrided the skin and subcutaneous tissue and superficial muscle in small steps until I got all the necrotic and foul purulent material out of the way and there was good bleeding tissue underneath. This was an 8 cm x 24 cm area of debridement. The penis was intact. The scrotum was intact. After cauterizing all bleeders I washed the wound with a pulsatile irrigator and packed it with 4 x 4 saline gauze. I performed  a rectal exam and felt no mass or mucosal abnormality in any direction. Bandages and fishnet panties were placed. The patient was taken recovery room in stable condition. EBL 25 cc. Complications none. Counts correct.     Angelia Mould. Derrell Lolling, M.D., FACS General and Minimally Invasive Surgery Breast and Colorectal Surgery  07/28/2011 6:50 PM

## 2011-07-29 LAB — CBC
HCT: 33.9 % — ABNORMAL LOW (ref 39.0–52.0)
Hemoglobin: 11 g/dL — ABNORMAL LOW (ref 13.0–17.0)
MCH: 19.5 pg — ABNORMAL LOW (ref 26.0–34.0)
MCHC: 32.4 g/dL (ref 30.0–36.0)
RBC: 5.65 MIL/uL (ref 4.22–5.81)

## 2011-07-29 LAB — GLUCOSE, CAPILLARY
Glucose-Capillary: 119 mg/dL — ABNORMAL HIGH (ref 70–99)
Glucose-Capillary: 253 mg/dL — ABNORMAL HIGH (ref 70–99)
Glucose-Capillary: 251 mg/dL — ABNORMAL HIGH (ref 70–99)
Glucose-Capillary: 134 mg/dL — ABNORMAL HIGH (ref 70–99)

## 2011-07-29 LAB — BASIC METABOLIC PANEL
BUN: 6 mg/dL (ref 6–23)
Chloride: 96 mEq/L (ref 96–112)
GFR calc Af Amer: >90 mL/min (ref >90)
GFR calc non Af Amer: >90 mL/min (ref >90)
Glucose, Bld: 138 mg/dL — ABNORMAL HIGH (ref 70–99)
Potassium: 3.6 mEq/L (ref 3.5–5.1)
Sodium: 133 mEq/L — ABNORMAL LOW (ref 135–145)

## 2011-07-29 LAB — MAGNESIUM: Magnesium: 1.8 mg/dL (ref 1.5–2.5)

## 2011-07-29 MED ORDER — INSULIN GLARGINE 100 UNIT/ML ~~LOC~~ SOLN: 15.0000 [IU] | Freq: Every day | SUBCUTANEOUS | Status: DC

## 2011-07-29 MED ORDER — MORPHINE SULFATE 2 MG/ML IJ SOLN
1.0000 mg | INTRAMUSCULAR | Status: DC | PRN
  Administered 2011-07-29: 4 mg via INTRAVENOUS
  Administered 2011-07-29 (×2): 2 mg via INTRAVENOUS
  Administered 2011-07-30 (×2): 4 mg via INTRAVENOUS
  Administered 2011-07-30: 2 mg via INTRAVENOUS
  Administered 2011-07-30 – 2011-08-02 (×11): 4 mg via INTRAVENOUS
  Administered 2011-08-02: 2 mg via INTRAVENOUS
  Administered 2011-08-03: 4 mg via INTRAVENOUS
  Filled 2011-07-29 (×2): qty 2
  Filled 2011-07-29: qty 1
  Filled 2011-07-29 (×2): qty 2
  Filled 2011-07-29: qty 1
  Filled 2011-07-29: qty 2
  Filled 2011-07-29: qty 1
  Filled 2011-07-29 (×5): qty 2
  Filled 2011-07-29: qty 1
  Filled 2011-07-29 (×3): qty 2
  Filled 2011-07-29 (×2): qty 1
  Filled 2011-07-29 (×2): qty 2

## 2011-07-29 MED ORDER — POLYETHYLENE GLYCOL 3350 17 G PO PACK
17.0000 g | PACK | Freq: Once | ORAL | Status: DC
  Filled 2011-07-29: qty 1

## 2011-07-29 MED ORDER — LIVING WELL WITH DIABETES BOOK
Freq: Once | Status: AC
  Administered 2011-07-29: 12:00:00
  Filled 2011-07-29: qty 1

## 2011-07-29 MED ORDER — VANCOMYCIN HCL 1000 MG IV SOLR
1500.0000 mg | Freq: Three times a day (TID) | INTRAVENOUS | Status: DC
  Administered 2011-07-29 – 2011-07-31 (×4): 1500 mg via INTRAVENOUS
  Filled 2011-07-29 (×8): qty 1500

## 2011-07-29 MED ORDER — BD GETTING STARTED TAKE HOME KIT: 1/2ML X 30G SYRINGES
1.0000 | Freq: Once | Status: AC
  Administered 2011-07-29: 1
  Filled 2011-07-29: qty 1

## 2011-07-29 MED ORDER — INSULIN ASPART 100 UNIT/ML ~~LOC~~ SOLN
0.0000 [IU] | Freq: Three times a day (TID) | SUBCUTANEOUS | Status: DC
  Administered 2011-07-29: 3 [IU] via SUBCUTANEOUS
  Administered 2011-07-29 – 2011-07-30 (×4): 8 [IU] via SUBCUTANEOUS

## 2011-07-29 MED ORDER — INSULIN ASPART PROT & ASPART (70-30 MIX) 100 UNIT/ML ~~LOC~~ SUSP
12.0000 [IU] | Freq: Two times a day (BID) | SUBCUTANEOUS | Status: DC
  Administered 2011-07-29 – 2011-07-30 (×2): 12 [IU] via SUBCUTANEOUS
  Filled 2011-07-29: qty 3

## 2011-07-29 NOTE — Progress Notes (Addendum)
Patient admitted with uncontrolled diabetes, perirectal abscess.  S/p I&D 07/28/11.  Patient was initially placed on IV insulin drip per GlucoStabilizer.  Taken off IV insulin drip at 1am today.  7AM CBG today 253 mg/dl.  Patient was taken off IV insulin drip and not given any long-acting insulin last night.  Scheduled to get 15 units Lantus tonight at bedtime.  Spoke with pt about his diabetes.  Discussed A1C results with him and explained what an A1C is, basic pathophysiology of DM Type 2, basic home care, importance of checking CBGs and maintaining good CBG control to prevent long-term and short-term complications.  Reviewed signs and symptoms of hyperglycemia and hypoglycemia.  RNs to provide ongoing basic DM education at bedside with this patient.  Have ordered educational booklet, insulin starter kit, and DM videos.  Discussed the possibility that patient may need to take insulin at home to control his CBGs.  Patient hesitant but open to taking insulin at home.  Will go ahead and order an insulin starter kit so that RNs can begin insulin instruction with patient just in case decision made to send patient home on insulin.  Gave patient information on obtaining an inexpensive CBG meter OTC at [facility].  Encouraged patient to purchase a meter and strips at [employer] (meter $16 and box of 50 strips $9) and encouraged patient to check his CBGs at least twice a day at home and to record all the results.  Noted care management following patient.  Patient needs help finding affordable care after d/c (patient is self-pay).  Needs follow-up with MD for further DM management.  MD, if you decide to send patient home on insulin, patient will need to be d/c'd on a more affordable insulin regimen.   Patient will not be able to pay for Lantus and Novolog out of pocket.   If you decide to d/c patient on insulin, please consider switching patient to 70/30 insulin bid.  Patient can purchase Humulin Reli-on 70/30  insulin at [facility] for $24.88 per vial out of pocket.   Noted patient to start on Lantus 15 units QHS tonight at bedtime.  Might consider switching patient to 70/30 insulin- 10 units bid with meals (to start tonight at dinner).    Will follow Ambrose Finland RN, MSN, CDE Diabetes Coordinator Inpatient Diabetes Program [phone]

## 2011-07-29 NOTE — Progress Notes (Signed)
Physical Therapy Wound EVAL Patient Details  Name: Frank Schneider MRN: [id number] Date of Birth: [birth year]  Today's Date: 07/29/2011 Time:  -     Subjective     Pain Score: Pain Score:   3  Wound Assessment  Wound 07/29/11 Scrotum s/p surgical I&D of scrotum -- Fournier's Gangrene (Active)  Site / Wound Assessment Painful;Pink;Red;Brown 07/29/2011  1:00 PM  % Wound base Red or Granulating 70% 07/29/2011  1:00 PM  % Wound base Other (Comment) 30% 07/29/2011  1:00 PM  Peri-wound Assessment Edema;Maceration 07/29/2011  1:00 PM  Wound Length (cm) 7.2 cm 07/29/2011  1:00 PM  Wound Width (cm) 4.5 cm 07/29/2011  1:00 PM  Wound Depth (cm) 4.2 cm 07/29/2011  1:00 PM  Drainage Amount Minimal 07/29/2011  1:00 PM  Drainage Description Serosanguineous;No odor 07/29/2011  1:00 PM  Non-staged Wound Description Not applicable 07/29/2011  1:00 PM  Treatment Cleansed;Hydrotherapy (Pulse lavage);Packing (Saline gauze) 07/29/2011  1:00 PM  Dressing Type ABD;Moist to dry;Mesh briefs 07/29/2011  1:00 PM  Dressing Changed Changed 07/29/2011  1:00 PM  Dressing Status Old drainage;New drainage 07/29/2011  1:00 PM     Incision 07/28/11 Perineum Other (Comment) (Active)  Dressing Type ABD 07/29/2011  9:49 AM  Dressing Clean;Intact;Dry 07/29/2011  9:49 AM   **depth difficult to assess due to pain level; Pt was premedicated with IV meds prior to treatment and tolerated well.  Hydrotherapy Pulsed lavage therapy - wound location: scrotum Pulsed Lavage with Suction (psi):  (4-8) Pulsed Lavage Tip: Tip with splash shield   Wound Assessment and Plan  Wound Therapy - Assess/Plan/Recommendations Wound Therapy - Clinical Statement: pt will benefit from pulsed lavage to cleanse wound and facilitate healing. Wound Therapy - Functional Problem List: painful mobility Factors Delaying/Impairing Wound Healing: Infection - systemic/local;Substance abuse;Diabetes Mellitus (hx polysubstance abuse) Hydrotherapy Plan: Debridement;Dressing  change;Patient/family education;Pulsatile lavage with suction Wound Therapy - Frequency: 6X / week Wound Therapy - Follow Up Recommendations: Home health RN  Wound Therapy Goals- Improve the function of patient's integumentary system by progressing the wound(s) through the phases of wound healing (inflammation - proliferation - remodeling) by: Decrease Necrotic Tissue to: 10 Decrease Necrotic Tissue - Progress: Goal set today Increase Granulation Tissue to: 90 Increase Granulation Tissue - Progress: Goal set today Patient/Family will be able to : verbalize cleansing and dessing change Patient/Family Instruction Goal - Progress: Goal set today Goals/treatment plan/discharge plan were made with and agreed upon by patient/family: Yes Time For Goal Achievement: 7 days Wound Therapy - Potential for Goals: Good  Goals will be updated until maximal potential achieved or discharge criteria met.  Discharge criteria: when goals achieved, discharge from hospital, MD decision/surgical intervention, no progress towards goals, refusal/missing three consecutive treatments without notification or medical reason.  [facility] 07/29/2011, 2:10 PM

## 2011-07-29 NOTE — Progress Notes (Signed)
Subjective: Pt w/o complaints Objective: Filed Vitals:   07/29/11 1000 07/29/11 1400 07/29/11 1631 07/29/11 1800  BP: 134/67 138/79  130/67  Pulse: 87 77  81  Temp: 98.4 F (36.9 C) 97.3 F (36.3 C)  99.6 F (37.6 C)  TempSrc: Oral Oral  Oral  Resp: 20 20  20   Height:      Weight:      SpO2: 100% 100% 96% 99%   Weight change:   Intake/Output Summary (Last 24 hours) at 07/29/11 2012 Last data filed at 07/29/11 1700  Gross per 24 hour  Intake 1898.75 ml  Output   1670 ml  Net 228.75 ml    General: Alert, awake, oriented x3, in no acute distress.  HEENT: [place]/AT PEERL, EOMI Neck: Trachea midline,  no masses, no thyromegal,y no JVD, no carotid bruit OROPHARYNX:  Moist, No exudate/ erythema/lesions.  Heart: Regular rate and rhythm, without murmurs, rubs, gallops, PMI non-displaced, no heaves or thrills on palpation.  Lungs: Clear to auscultation, no wheezing or rhonchi noted. No increased vocal fremitus resonant to percussion    Lab Results:  Basename 07/29/11 0434 07/28/11 1420 07/28/11 1205  NA 133* -- 127*  K 3.6 -- 4.7  CL 96 -- 86*  CO2 29 -- 25  GLUCOSE 138* -- 427*  BUN 6 -- 11  CREATININE 0.91 -- 0.95  CALCIUM 8.5 -- 10.1  MG 1.8 1.8 --  PHOS -- -- --    Basename 07/28/11 1420  AST 12  ALT 13  ALKPHOS 95  BILITOT 0.6  PROT 7.4  ALBUMIN 3.2*   No results found for this basename: LIPASE:2,AMYLASE:2 in the last 72 hours  Basename 07/29/11 0434 07/28/11 1205  WBC 19.3* 24.5*  NEUTROABS -- 20.6*  HGB 11.0* 12.7*  HCT 33.9* 38.1*  MCV 60.0* 59.6*  PLT 246 235   No results found for this basename: CKTOTAL:3,CKMB:3,CKMBINDEX:3,TROPONINI:3 in the last 72 hours No components found with this basename: POCBNP:3 No results found for this basename: DDIMER:2 in the last 72 hours  Basename 07/28/11 1420  HGBA1C 11.3*   No results found for this basename: CHOL:2,HDL:2,LDLCALC:2,TRIG:2,CHOLHDL:2,LDLDIRECT:2 in the last 72 hours No results found for this  basename: TSH,T4TOTAL,FREET3,T3FREE,THYROIDAB in the last 72 hours No results found for this basename: VITAMINB12:2,FOLATE:2,FERRITIN:2,TIBC:2,IRON:2,RETICCTPCT:2 in the last 72 hours  Micro Results: Recent Results (from the past 240 hour(s))  SURGICAL PCR SCREEN     Status: Abnormal   Collection Time   07/28/11  4:46 PM      Component Value Range Status Comment   MRSA, PCR NEGATIVE  NEGATIVE  Final    Staphylococcus aureus POSITIVE (*) NEGATIVE  Final   ANAEROBIC CULTURE     Status: Normal (Preliminary result)   Collection Time   07/28/11  6:24 PM      Component Value Range Status Comment   Specimen Description PERITONEAL FOURNIER'S GANGRENE   Final    Special Requests NONE   Final    Gram Stain     Final    Value: NO WBC SEEN     NO SQUAMOUS EPITHELIAL CELLS SEEN     RARE GRAM POSITIVE COCCI IN PAIRS     RARE GRAM POSITIVE RODS   Culture     Final    Value: NO ANAEROBES ISOLATED; CULTURE IN PROGRESS FOR 5 DAYS   Report Status PENDING   Incomplete   WOUND CULTURE     Status: Normal (Preliminary result)   Collection Time   07/28/11  6:24 PM  Component Value Range Status Comment   Specimen Description PERITONEAL FOURNIER'S GANGRENE   Final    Special Requests NONE   Final    Gram Stain     Final    Value: NO WBC SEEN     NO SQUAMOUS EPITHELIAL CELLS SEEN     RARE GRAM POSITIVE COCCI IN PAIRS     RARE GRAM POSITIVE RODS   Culture NO GROWTH   Final    Report Status PENDING   Incomplete     Studies/Results: Ct Pelvis W Contrast  07/28/2011  *RADIOLOGY REPORT*  Clinical Data:  Abscess.  Hyperglycemia.  CT PELVIS WITH CONTRAST  Technique:  Multidetector CT imaging of the pelvis was performed using the standard protocol following the bolus administration of intravenous contrast.  Contrast:   100 ml Omnipaque 300  Comparison:   None.  Findings:  Diffuse inflammatory changes are noted along the right parasagittal perineum and into the posterior gluteal fold.  There is gas within the  soft tissues as well, compatible with infection and abscess.  Mild edematous changes are noted on the left but without gas.  The edematous changes and gas extend to the base of the scrotum.  There is no gas within the scrotum or along the penis.  A rectal balloon is in place.  Contrast fills the visualized descending colon.  The appendix is visualized and normal. Atherosclerotic calcifications are present within the iliac vessels without aneurysm.  The urinary bladder is normal.  The visualized small bowel is normal.  There is no significant free fluid. Bilateral inguinal adenopathy is likely reactive.  The bone windows are unremarkable.  IMPRESSION:  1.  Extensive edematous changes, fluid and gas within the right parasagittal perineum compatible with abscess.  This could represent early Fornier's gangrene. 2.  Multiple bilateral inguinal lymph nodes are likely reactive. 3.  No significant extension into the pelvis.  Original Report Authenticated By: Jamesetta Orleans. MATTERN, M.D.    Medications: I have reviewed the patient's current medications. Scheduled Meds:   . bd getting started take home kit  1 kit Other Once  . clindamycin (CLEOCIN) IV  600 mg Intravenous Q8H  . ertapenem (INVANZ) IV  1 g Intravenous Q24H  . heparin  5,000 Units Subcutaneous Q8H  . insulin aspart  0-15 Units Subcutaneous TID WC  . insulin aspart protamine-insulin aspart  12 Units Subcutaneous BID WC  . living well with diabetes book   Does not apply Once  . metoprolol      . pantoprazole (PROTONIX) IV  40 mg Intravenous QHS  . pneumococcal 23 valent vaccine  0.5 mL Intramuscular Tomorrow-1000  . polyethylene glycol  17 g Oral Once  . vancomycin  1,000 mg Intravenous Q8H  . DISCONTD: docusate sodium  100 mg Oral BID  . DISCONTD: ertapenem  1 g Intravenous Q24H  . DISCONTD: insulin glargine  15 Units Subcutaneous QHS  . DISCONTD: insulin regular  0-10 Units Intravenous TID WC   Continuous Infusions:   . sodium chloride  75 mL/hr at 07/29/11 1022  . dextrose 5 % and 0.45% NaCl    . DISCONTD: sodium chloride 1,000 mL (07/28/11 1937)  . DISCONTD: insulin (NOVOLIN-R) infusion 2.7 Units/hr (07/28/11 1554)   PRN Meds:.dextrose, morphine, ondansetron (ZOFRAN) IV, ondansetron, oxyCODONE-acetaminophen, DISCONTD: acetaminophen, DISCONTD: acetaminophen, DISCONTD: morphine, DISCONTD: morphine, DISCONTD: ondansetron Assessment/Plan: Patient Active Hospital Problem List: No active hospital problems.  Diabetes: Will start Novolin 70/30 at 15 units every 12 hours. Start insulin education.  LOS:  1 day

## 2011-07-29 NOTE — Progress Notes (Signed)
ANTIBIOTIC CONSULT NOTE - FOLLOW UP  Pharmacy Consult for vancomycin Indication: Groin abscess c/w Fournier's gangrene    No Known Allergies  Patient Measurements: Height: 5\' 10"  (177.8 cm) Weight: 190 lb (86.183 kg) IBW/kg (Calculated) : 73  Adjusted Body Weight:   Vital Signs: Temp: 100.1 F (37.8 C) (04/03 2147) Temp src: Oral (04/03 2147) BP: 127/72 mmHg (04/03 2147) Pulse Rate: 81  (04/03 2147) Intake/Output from previous day: 04/02 0701 - 04/03 0700 In: 2100 [I.V.:1500; IV Piggyback:600] Out: 1170 [Urine:1170] Intake/Output from this shift: Total I/O In: -  Out: 500 [Urine:500]  Labs:  [facility] 07/29/11 0434 07/28/11 1205  WBC 19.3* 24.5*  HGB 11.0* 12.7*  PLT 246 235  LABCREA -- --  CREATININE 0.91 0.95   Estimated Creatinine Clearance: 117 ml/min (by C-G formula based on Cr of 0.91).  Basename 07/29/11 2120  VANCOTROUGH 10.1  VANCOPEAK --  VANCORANDOM --  GENTTROUGH --  GENTPEAK --  GENTRANDOM --  TOBRATROUGH --  TOBRAPEAK --  TOBRARND --  AMIKACINPEAK --  AMIKACINTROU --  AMIKACIN --     Microbiology: Recent Results (from the past 720 hour(s))  SURGICAL PCR SCREEN     Status: Abnormal   Collection Time   07/28/11  4:46 PM      Component Value Range Status Comment   MRSA, PCR NEGATIVE  NEGATIVE  Final    Staphylococcus aureus POSITIVE (*) NEGATIVE  Final   ANAEROBIC CULTURE     Status: Normal (Preliminary result)   Collection Time   07/28/11  6:24 PM      Component Value Range Status Comment   Specimen Description PERITONEAL FOURNIER'S GANGRENE   Final    Special Requests NONE   Final    Gram Stain     Final    Value: NO WBC SEEN     NO SQUAMOUS EPITHELIAL CELLS SEEN     RARE GRAM POSITIVE COCCI IN PAIRS     RARE GRAM POSITIVE RODS   Culture     Final    Value: NO ANAEROBES ISOLATED; CULTURE IN PROGRESS FOR 5 DAYS   Report Status PENDING   Incomplete   WOUND CULTURE     Status: Normal (Preliminary result)   Collection Time   07/28/11   6:24 PM      Component Value Range Status Comment   Specimen Description PERITONEAL FOURNIER'S GANGRENE   Final    Special Requests NONE   Final    Gram Stain     Final    Value: NO WBC SEEN     NO SQUAMOUS EPITHELIAL CELLS SEEN     RARE GRAM POSITIVE COCCI IN PAIRS     RARE GRAM POSITIVE RODS   Culture NO GROWTH   Final    Report Status PENDING   Incomplete     Anti-infectives     Start     Dose/Rate Route Frequency Ordered Stop   07/29/11 2245   vancomycin (VANCOCIN) 1,500 mg in sodium chloride 0.9 % 500 mL IVPB        1,500 mg 250 mL/hr over 120 Minutes Intravenous 3 times per day 07/29/11 2235     07/29/11 1600   ertapenem (INVANZ) 1 g in sodium chloride 0.9 % 50 mL IVPB        1 g 100 mL/hr over 30 Minutes Intravenous Every 24 hours 07/28/11 2035     07/28/11 2200   vancomycin (VANCOCIN) IVPB 1000 mg/200 mL premix  Status:  Discontinued  1,000 mg 200 mL/hr over 60 Minutes Intravenous Every 8 hours 07/28/11 1445 07/29/11 2234   07/28/11 1630   clindamycin (CLEOCIN) IVPB 600 mg        600 mg 100 mL/hr over 30 Minutes Intravenous 3 times per day 07/28/11 1616     07/28/11 1500   vancomycin (VANCOCIN) IVPB 1000 mg/200 mL premix        1,000 mg 200 mL/hr over 60 Minutes Intravenous  Once 07/28/11 1432 07/28/11 1749   07/28/11 1445   ertapenem (INVANZ) 1 g in sodium chloride 0.9 % 50 mL IVPB  Status:  Discontinued        1 g 100 mL/hr over 30 Minutes Intravenous Every 24 hours 07/28/11 1432 07/28/11 2045   07/28/11 1430   gentamicin (GARAMYCIN) IVPB 100 mg  Status:  Discontinued        100 mg 200 mL/hr over 30 Minutes Intravenous  Once 07/28/11 1344 07/28/11 1512   07/28/11 1345   metroNIDAZOLE (FLAGYL) IVPB 500 mg  Status:  Discontinued        500 mg 100 mL/hr over 60 Minutes Intravenous  Once 07/28/11 1344 07/28/11 1512   07/28/11 1200   clindamycin (CLEOCIN) IVPB 600 mg        600 mg 100 mL/hr over 30 Minutes Intravenous  Once 07/28/11 1152 07/28/11 1326            Assessment: Patient with low vancomycin level.    Goal of Therapy:  Vancomycin trough level 15-20 mcg/ml  Plan:  Measure antibiotic drug levels at steady state Follow up culture results Increase to 1500mg  iv q8hr, 1st dose now  Darlina Guys, Jacquenette Shone Crowford 07/29/2011,10:37 PM

## 2011-07-29 NOTE — Progress Notes (Signed)
Patient is interviewed and examined. Mental status normal. Alert and comfortable. There has been no bleeding.  Perineal wound is clean. There is no further necrosis or purulence. , and therefore no need for further debridement and OR.  Plan: PT consult for daily pulsatile lavage. Saline wet-to-dry dressings every shift. Wound and ostomy consultation regarding long-term plan Continue broad-spectrum antibiotics for Fornier's gangrene.  Poorly controlled diabetes mellitus, being managed by triad hospitalist. Will need outpatient management plan   Angelia Mould. Derrell Lolling, M.D., [facility] General and Minimally invasive Surgery Breast and Colorectal Surgery Office:   [phone] Pager:   [phone]

## 2011-07-29 NOTE — Progress Notes (Signed)
1 Day Post-Op  Subjective: Doing pretty well this Am.  Having pain, but controled by analgesics.  Not OOB yet.  Objective: Vital signs in last 24 hours: Temp:  [97.2 F (36.2 C)-101 F (38.3 C)] 99.2 F (37.3 C) (04/03 0542) Pulse Rate:  [81-125] 81  (04/03 0542) Resp:  [15-22] 20  (04/03 0542) BP: (108-179)/(60-98) 119/64 mmHg (04/03 0542) SpO2:  [96 %-100 %] 100 % (04/03 0542) Weight:  [86.183 kg (190 lb)-86.2 kg (190 lb 0.6 oz)] 86.183 kg (190 lb) (04/02 1531)  Tm 99.9, vss. Glucose variable  Intake/Output from previous day: 04/02 0701 - 04/03 0700 In: 2100 [I.V.:1500; IV Piggyback:600] Out: 1170 [Urine:1170] Intake/Output this shift:    General appearance: alert, cooperative and no distress Resp: clear to auscultation bilaterally Incision/Wound: Dressing was taken down, The area is packed and I didn't remove it yet.  I will try to coordinate pain med, Dr. Derrell Lolling and myself to see it later this AM  Lab Results:   [facility] 07/29/11 0434 07/28/11 1205  WBC 19.3* 24.5*  HGB 11.0* 12.7*  HCT 33.9* 38.1*  PLT 246 235    BMET  Basename 07/29/11 0434 07/28/11 1205  NA 133* 127*  K 3.6 4.7  CL 96 86*  CO2 29 25  GLUCOSE 138* 427*  BUN 6 11  CREATININE 0.91 0.95  CALCIUM 8.5 10.1   PT/INR  Basename 07/28/11 1420  LABPROT 15.1  INR 1.17     Lab 07/28/11 1420 07/26/11 1335  AST 12 16  ALT 13 9  ALKPHOS 95 95  BILITOT 0.6 0.8  PROT 7.4 6.8  ALBUMIN 3.2* 3.3*     Lipase  No results found for this basename: lipase     Studies/Results: Ct Pelvis W Contrast  07/28/2011  *RADIOLOGY REPORT*  Clinical Data:  Abscess.  Hyperglycemia.  CT PELVIS WITH CONTRAST  Technique:  Multidetector CT imaging of the pelvis was performed using the standard protocol following the bolus administration of intravenous contrast.  Contrast:   100 ml Omnipaque 300  Comparison:   None.  Findings:  Diffuse inflammatory changes are noted along the right parasagittal perineum and into the  posterior gluteal fold.  There is gas within the soft tissues as well, compatible with infection and abscess.  Mild edematous changes are noted on the left but without gas.  The edematous changes and gas extend to the base of the scrotum.  There is no gas within the scrotum or along the penis.  A rectal balloon is in place.  Contrast fills the visualized descending colon.  The appendix is visualized and normal. Atherosclerotic calcifications are present within the iliac vessels without aneurysm.  The urinary bladder is normal.  The visualized small bowel is normal.  There is no significant free fluid. Bilateral inguinal adenopathy is likely reactive.  The bone windows are unremarkable.  IMPRESSION:  1.  Extensive edematous changes, fluid and gas within the right parasagittal perineum compatible with abscess.  This could represent early Fornier's gangrene. 2.  Multiple bilateral inguinal lymph nodes are likely reactive. 3.  No significant extension into the pelvis.  Original Report Authenticated By: Jamesetta Orleans. MATTERN, M.D.    Medications:    . clindamycin (CLEOCIN) IV  600 mg Intravenous Once  . clindamycin (CLEOCIN) IV  600 mg Intravenous Q8H  . ertapenem (INVANZ) IV  1 g Intravenous Q24H  . fentaNYL      . heparin  5,000 Units Subcutaneous Q8H  . insulin aspart  0-15 Units  Subcutaneous TID WC  . insulin glargine  15 Units Subcutaneous QHS  . metoprolol      . pantoprazole (PROTONIX) IV  40 mg Intravenous QHS  . pneumococcal 23 valent vaccine  0.5 mL Intramuscular Tomorrow-1000  . sodium chloride  1,000 mL Intravenous Once  . vancomycin  1,000 mg Intravenous Once  . vancomycin  1,000 mg Intravenous Q8H  . DISCONTD: docusate sodium  100 mg Oral BID  . DISCONTD: ertapenem  1 g Intravenous Q24H  . DISCONTD: gentamicin  100 mg Intravenous Once  . DISCONTD: insulin regular  0-10 Units Intravenous TID WC  . DISCONTD: metronidazole  500 mg Intravenous Once    Assessment/Plan Fournier's  gangrene of the perineum, s/p Debridement of skin, subcutaneous tissue, and muscle of the perineum, 8 cm x 24 cm dimension 07/28/11 Dr. Derrell Lolling. AODM uncontrolled  HbA1C 11.3 Hypertension polysubstance abuse; drug screen + MJ MRSA +  Plan:  Continue Antibiotics, Glucose control, start clears, plan bid dressing changes.  Start later today.       LOS: 1 day    Frank Schneider 07/29/2011

## 2011-07-29 NOTE — Progress Notes (Signed)
INITIAL ADULT NUTRITION ASSESSMENT Date: 07/29/2011   Time: 9:45 AM Reason for Assessment: Nutrition risk   ASSESSMENT: Male 36 y.o.  Dx: Abscess, hyperglycemia   Hx:  Past Medical History  Diagnosis Date  . Diabetes mellitus   . Hypertension   . Priapism   . Tobacco use    Related Meds:  Scheduled Meds:   . clindamycin (CLEOCIN) IV  600 mg Intravenous Once  . clindamycin (CLEOCIN) IV  600 mg Intravenous Q8H  . ertapenem (INVANZ) IV  1 g Intravenous Q24H  . fentaNYL      . heparin  5,000 Units Subcutaneous Q8H  . insulin aspart  0-15 Units Subcutaneous TID WC  . insulin glargine  15 Units Subcutaneous QHS  . metoprolol      . pantoprazole (PROTONIX) IV  40 mg Intravenous QHS  . pneumococcal 23 valent vaccine  0.5 mL Intramuscular Tomorrow-1000  . sodium chloride  1,000 mL Intravenous Once  . vancomycin  1,000 mg Intravenous Once  . vancomycin  1,000 mg Intravenous Q8H  . DISCONTD: docusate sodium  100 mg Oral BID  . DISCONTD: ertapenem  1 g Intravenous Q24H  . DISCONTD: gentamicin  100 mg Intravenous Once  . DISCONTD: insulin regular  0-10 Units Intravenous TID WC  . DISCONTD: metronidazole  500 mg Intravenous Once   Continuous Infusions:   . sodium chloride    . dextrose 5 % and 0.45% NaCl    . DISCONTD: sodium chloride    . DISCONTD: sodium chloride    . DISCONTD: sodium chloride 1,000 mL (07/28/11 1937)  . DISCONTD: insulin (NOVOLIN-R) infusion 2.7 Units/hr (07/28/11 1554)   PRN Meds:.dextrose, iohexol, metoprolol, morphine, ondansetron (ZOFRAN) IV, ondansetron, oxyCODONE-acetaminophen, DISCONTD: acetaminophen, DISCONTD: acetaminophen, DISCONTD: fentaNYL, DISCONTD: ketorolac, DISCONTD: meperidine (DEMEROL) injection, DISCONTD: morphine, DISCONTD: morphine, DISCONTD: ondansetron, DISCONTD: sodium chloride irrigation  Ht: 5\' 10"  (177.8 cm)  Wt: 190 lb (86.183 kg)  Ideal Wt: 166 lb % Ideal Wt: 114  Usual Wt: 193 lb 12.6 oz (87.9 kg) on 04/29/11 % Usual Wt:  98  Body mass index is 27.26 kg/(m^2).  Food/Nutrition Related Hx: Pt admitted with poor PO intake for 1 week r/t nausea. Pt with groin abscess possibly early Fournier's gangrene. POD# 1 skin debridement and irrigation of this abscess. Pt reports prior to 1 week before admission he was eating normal, typically 2 meals/day, skipping breakfast, not following a diabetic diet. Pt reports some unintended weight loss, unsure of how much, however noted pt has lost 3 pounds since January 2013. Pt denies any nausea today, states his appetite is back today and he is ready to eat.   Labs:  CMP     Component Value Date/Time   NA 133* 07/29/2011 0434   K 3.6 07/29/2011 0434   CL 96 07/29/2011 0434   CO2 29 07/29/2011 0434   GLUCOSE 138* 07/29/2011 0434   BUN 6 07/29/2011 0434   CREATININE 0.91 07/29/2011 0434   CALCIUM 8.5 07/29/2011 0434   PROT 7.4 07/28/2011 1420   ALBUMIN 3.2* 07/28/2011 1420   AST 12 07/28/2011 1420   ALT 13 07/28/2011 1420   ALKPHOS 95 07/28/2011 1420   BILITOT 0.6 07/28/2011 1420   GFRNONAA >90 07/29/2011 0434   GFRAA >90 07/29/2011 0434   CBG (last 3)   Basename 07/29/11 0717 07/29/11 0219 07/29/11 0117  GLUCAP 253* 90 119*   Lab Results  Component Value Date   HGBA1C 11.3* 07/28/2011    Diet Order: Clear Liquid   IVF:  sodium chloride   dextrose 5 % and 0.45% NaCl   DISCONTD: sodium chloride   DISCONTD: sodium chloride   DISCONTD: sodium chloride Last Rate: 1,000 mL (07/28/11 1937)  DISCONTD: insulin (NOVOLIN-R) infusion Last Rate: 2.7 Units/hr (07/28/11 1554)    Estimated Nutritional Needs:   Kcal: 1800-2150 Protein: 85-105g Fluid: 1.8-2.1L  NUTRITION DIAGNOSIS: -Inadequate oral intake (NI-2.1).  Status: Ongoing  RELATED TO: POD#1 surgery for abscess  AS EVIDENCE BY: clear liquid diet  MONITORING/EVALUATION(Goals): Advance diet as tolerated to diabetic diet.   EDUCATION NEEDS: -Education needs addressed - reviewed diabetic diet including sources of CHO in diet and  recommended portion sizes. Provided handout of this information and RD contact information. Pt expressed understanding.   INTERVENTION: Diet advancement per MD. Glucerna shake BID once diet advanced. Will monitor.   Dietitian #: [phone]  DOCUMENTATION CODES Per approved criteria  -Not Applicable    Marshall Cork 07/29/2011, 9:45 AM

## 2011-07-29 NOTE — Consult Note (Signed)
WOC consult Note Reason for Consult:perineal wound Wound type:Infectious; History of Fournier's gangrene Pressure Ulcer POA: No Measurement:Wound Not seen today Wound AOZ:HYQMV Not seen today Drainage (amount, consistency, odor) Wound not seen today Periwound:Wound not seen today Dressing procedure/placement/frequency:Discussed with PT who will be doing hydrotherapy (pulsatile lavage) today.  Agree with NS twice daily dressing changes, once with MD (M-F in early am as Dr. Derrell Lolling has indicated) and once in late afternoon by PT (6 days a week).  Nursing to do dressing changes when PT or MD not available. I will plan to see one day this week with PT during treatment.  Cleansing with mild agitation and pressure will be optimal, such as with a hand held shower-device, post discharge. I will not follow, but will remain available to this patient and his surgical team.  Please re-consult if needed. Thanks, Ladona Mow, MSN, RN, [facility], CWOCN [phone])

## 2011-07-30 LAB — BASIC METABOLIC PANEL
CO2: 29 mEq/L (ref 19–32)
Calcium: 8.3 mg/dL — ABNORMAL LOW (ref 8.4–10.5)
GFR calc non Af Amer: >90 mL/min (ref >90)
Glucose, Bld: 211 mg/dL — ABNORMAL HIGH (ref 70–99)
Potassium: 3.6 mEq/L (ref 3.5–5.1)
Sodium: 134 mEq/L — ABNORMAL LOW (ref 135–145)

## 2011-07-30 LAB — CBC
Hemoglobin: 10.5 g/dL — ABNORMAL LOW (ref 13.0–17.0)
MCH: 19.7 pg — ABNORMAL LOW (ref 26.0–34.0)
Platelets: 273 10*3/uL (ref 150–400)
RBC: 5.34 MIL/uL (ref 4.22–5.81)

## 2011-07-30 LAB — GLUCOSE, CAPILLARY
Glucose-Capillary: 253 mg/dL — ABNORMAL HIGH (ref 70–99)
Glucose-Capillary: 254 mg/dL — ABNORMAL HIGH (ref 70–99)
Glucose-Capillary: 126 mg/dL — ABNORMAL HIGH (ref 70–99)
Glucose-Capillary: 255 mg/dL — ABNORMAL HIGH (ref 70–99)

## 2011-07-30 MED ORDER — PANTOPRAZOLE SODIUM 40 MG PO TBEC
40.0000 mg | DELAYED_RELEASE_TABLET | Freq: Every day | ORAL | Status: DC
  Administered 2011-07-30 – 2011-08-03 (×5): 40 mg via ORAL
  Filled 2011-07-30 (×5): qty 1

## 2011-07-30 MED ORDER — INSULIN ASPART 100 UNIT/ML ~~LOC~~ SOLN
0.0000 [IU] | Freq: Every day | SUBCUTANEOUS | Status: DC
  Administered 2011-07-30: 3 [IU] via SUBCUTANEOUS

## 2011-07-30 MED ORDER — POLYETHYLENE GLYCOL 3350 17 G PO PACK
17.0000 g | PACK | Freq: Once | ORAL | Status: AC
  Administered 2011-07-30: 17 g via ORAL
  Filled 2011-07-30: qty 1

## 2011-07-30 MED ORDER — GLUCERNA SHAKE PO LIQD
237.0000 mL | Freq: Two times a day (BID) | ORAL | Status: DC
  Administered 2011-07-30 – 2011-08-03 (×9): 237 mL via ORAL
  Filled 2011-07-30 (×13): qty 237

## 2011-07-30 MED ORDER — DOCUSATE SODIUM 100 MG PO CAPS
100.0000 mg | ORAL_CAPSULE | Freq: Two times a day (BID) | ORAL | Status: DC
  Administered 2011-07-30 – 2011-08-03 (×8): 100 mg via ORAL
  Filled 2011-07-30 (×10): qty 1

## 2011-07-30 MED ORDER — INSULIN ASPART PROT & ASPART (70-30 MIX) 100 UNIT/ML ~~LOC~~ SUSP
15.0000 [IU] | Freq: Two times a day (BID) | SUBCUTANEOUS | Status: DC
  Administered 2011-07-30 – 2011-07-31 (×2): 15 [IU] via SUBCUTANEOUS

## 2011-07-30 MED ORDER — INSULIN ASPART 100 UNIT/ML ~~LOC~~ SOLN
0.0000 [IU] | Freq: Three times a day (TID) | SUBCUTANEOUS | Status: DC
  Administered 2011-07-30: 1 [IU] via SUBCUTANEOUS
  Administered 2011-07-31: 9 [IU] via SUBCUTANEOUS
  Administered 2011-07-31: 19:00:00 via SUBCUTANEOUS
  Administered 2011-07-31: 5 [IU] via SUBCUTANEOUS

## 2011-07-30 NOTE — Progress Notes (Addendum)
07/30/11 1500 Physical Therapy Wound Progress Note  Subjective Assessment  Subjective Md had me on my back for dressing this am.  Patient and Family Stated Goals Heal wound  Date of Onset 07/24/11  Evaluation and Treatment  Evaluation and Treatment Procedures Explained to Patient/Family Yes  Evaluation and Treatment Procedures agreed to  Wound 07/29/11 Scrotum s/p surgical I&D of scrotum -- Fournier's Gangrene  Date First Assessed/Time First Assessed: 07/29/11 1135   Location: Scrotum  Wound Description (Comments): s/p surgical I&D of scrotum -- Fournier's Gangrene  Present on Admission: Yes  Site / Wound Assessment Pink;Yellow;Brown;Red  % Wound base Red or Granulating 65%  % Wound base Yellow 5%  % Wound base Other (Comment) 35% (cauterized vessels)  Peri-wound Assessment Edema  Wound Length (cm) 7.2 cm  Wound Width (cm) 4.5 cm  Wound Depth (cm) 4.2 cm  Drainage Amount Scant  Drainage Description Serosanguineous  Non-staged Wound Description Not applicable  Treatment Cleansed;Hydrotherapy (Pulse lavage);Packing (Saline gauze)  Dressing Type ABD;Moist to dry;Mesh briefs  Dressing Changed Changed  Dressing Status Clean;Dry;Intact  Hydrotherapy  Pulsed Lavage with Suction (psi) 4 psi  Pulsed Lavage Tip Tip with splash shield  Pulsed lavage therapy - wound location scrotum  Wound Therapy - Assess/Plan/Recommendations  Wound Therapy - Clinical Statement pt will benefit from pulsed lavage to cleanse wound and facilitate healing.  Wound Therapy - Functional Problem List Wound location difficult to access  Factors Delaying/Impairing Wound Healing Diabetes Mellitus;Infection - systemic/local;Substance abuse  Hydrotherapy Plan Debridement;Dressing change;Patient/family education;Pulsatile lavage with suction  Wound Therapy - Frequency 6X / week  Wound Therapy - Follow Up Recommendations Home health RN  Wound Therapy Goals - Improve the function of patient's integumentary system by  progressing the wound(s) through the phases of wound healing by:  Decrease Necrotic Tissue to 10  Decrease Necrotic Tissue - Progress Progressing toward goal  Increase Granulation Tissue to 90  Increase Granulation Tissue - Progress Progressing toward goal  Pain: 7-8/10 with treatment even with premed (pt reports better when medication directly prior to treatment; RN made aware)  Sheran Lawless, PT [phone] 07/30/2011

## 2011-07-30 NOTE — Progress Notes (Signed)
Subjective: Pt recently completed wound VAC therapy and complaining of pain. States that he's not in any mood to have conversation at this time. Objective: Filed Vitals:   07/30/11 1040 07/30/11 1300 07/30/11 1400 07/30/11 1800  BP: 150/78 138/75 140/79 156/84  Pulse: 86 78 97 99  Temp:  99 F (37.2 C) 99.1 F (37.3 C) 99 F (37.2 C)  TempSrc:  Oral Oral Oral  Resp:  20 19 19   Height:      Weight:      SpO2: 100% 99% 99% 98%   Weight change:   Intake/Output Summary (Last 24 hours) at 07/30/11 1857 Last data filed at 07/30/11 1341  Gross per 24 hour  Intake   3387 ml  Output   1600 ml  Net   1787 ml    General: Alert, awake, oriented x3, in no acute distress.  HEENT: [place]/AT PEERL, EOMI Neck: Trachea midline,  no masses, no thyromegal,y no JVD, no carotid bruit OROPHARYNX:  Moist, No exudate/ erythema/lesions.  Heart: Regular rate and rhythm, without murmurs, rubs, gallops, PMI non-displaced, no heaves or thrills on palpation.  Lungs: Clear to auscultation, no wheezing or rhonchi noted. No increased vocal fremitus resonant to percussion    Lab Results:  Basename 07/30/11 0431 07/29/11 0434 07/28/11 1420  NA 134* 133* --  K 3.6 3.6 --  CL 96 96 --  CO2 29 29 --  GLUCOSE 211* 138* --  BUN 7 6 --  CREATININE 1.00 0.91 --  CALCIUM 8.3* 8.5 --  MG -- 1.8 1.8  PHOS -- -- --    Basename 07/28/11 1420  AST 12  ALT 13  ALKPHOS 95  BILITOT 0.6  PROT 7.4  ALBUMIN 3.2*   No results found for this basename: LIPASE:2,AMYLASE:2 in the last 72 hours  Basename 07/30/11 0431 07/29/11 0434 07/28/11 1205  WBC 12.4* 19.3* --  NEUTROABS -- -- 20.6*  HGB 10.5* 11.0* --  HCT 32.0* 33.9* --  MCV 59.9* 60.0* --  PLT 273 246 --   No results found for this basename: CKTOTAL:3,CKMB:3,CKMBINDEX:3,TROPONINI:3 in the last 72 hours No components found with this basename: POCBNP:3 No results found for this basename: DDIMER:2 in the last 72 hours  Basename 07/28/11 1420  HGBA1C  11.3*   No results found for this basename: CHOL:2,HDL:2,LDLCALC:2,TRIG:2,CHOLHDL:2,LDLDIRECT:2 in the last 72 hours No results found for this basename: TSH,T4TOTAL,FREET3,T3FREE,THYROIDAB in the last 72 hours No results found for this basename: VITAMINB12:2,FOLATE:2,FERRITIN:2,TIBC:2,IRON:2,RETICCTPCT:2 in the last 72 hours  Micro Results: Recent Results (from the past 240 hour(s))  SURGICAL PCR SCREEN     Status: Abnormal   Collection Time   07/28/11  4:46 PM      Component Value Range Status Comment   MRSA, PCR NEGATIVE  NEGATIVE  Final    Staphylococcus aureus POSITIVE (*) NEGATIVE  Final   ANAEROBIC CULTURE     Status: Normal (Preliminary result)   Collection Time   07/28/11  6:24 PM      Component Value Range Status Comment   Specimen Description PERITONEAL FOURNIER'S GANGRENE   Final    Special Requests NONE   Final    Gram Stain     Final    Value: NO WBC SEEN     NO SQUAMOUS EPITHELIAL CELLS SEEN     RARE GRAM POSITIVE COCCI IN PAIRS     RARE GRAM POSITIVE RODS   Culture     Final    Value: NO ANAEROBES ISOLATED; CULTURE IN PROGRESS FOR 5  DAYS   Report Status PENDING   Incomplete   WOUND CULTURE     Status: Normal (Preliminary result)   Collection Time   07/28/11  6:24 PM      Component Value Range Status Comment   Specimen Description PERITONEAL FOURNIER'S GANGRENE   Final    Special Requests NONE   Final    Gram Stain     Final    Value: NO WBC SEEN     NO SQUAMOUS EPITHELIAL CELLS SEEN     RARE GRAM POSITIVE COCCI IN PAIRS     RARE GRAM POSITIVE RODS   Culture Culture reincubated for better growth   Final    Report Status PENDING   Incomplete     Studies/Results: Ct Pelvis W Contrast  07/28/2011  *RADIOLOGY REPORT*  Clinical Data:  Abscess.  Hyperglycemia.  CT PELVIS WITH CONTRAST  Technique:  Multidetector CT imaging of the pelvis was performed using the standard protocol following the bolus administration of intravenous contrast.  Contrast:   100 ml Omnipaque 300   Comparison:   None.  Findings:  Diffuse inflammatory changes are noted along the right parasagittal perineum and into the posterior gluteal fold.  There is gas within the soft tissues as well, compatible with infection and abscess.  Mild edematous changes are noted on the left but without gas.  The edematous changes and gas extend to the base of the scrotum.  There is no gas within the scrotum or along the penis.  A rectal balloon is in place.  Contrast fills the visualized descending colon.  The appendix is visualized and normal. Atherosclerotic calcifications are present within the iliac vessels without aneurysm.  The urinary bladder is normal.  The visualized small bowel is normal.  There is no significant free fluid. Bilateral inguinal adenopathy is likely reactive.  The bone windows are unremarkable.  IMPRESSION:  1.  Extensive edematous changes, fluid and gas within the right parasagittal perineum compatible with abscess.  This could represent early Fornier's gangrene. 2.  Multiple bilateral inguinal lymph nodes are likely reactive. 3.  No significant extension into the pelvis.  Original Report Authenticated By: Jamesetta Orleans. MATTERN, M.D.    Medications: I have reviewed the patient's current medications. Scheduled Meds:   . bd getting started take home kit  1 kit Other Once  . clindamycin (CLEOCIN) IV  600 mg Intravenous Q8H  . ertapenem (INVANZ) IV  1 g Intravenous Q24H  . heparin  5,000 Units Subcutaneous Q8H  . insulin aspart  0-15 Units Subcutaneous TID WC  . insulin aspart protamine-insulin aspart  12 Units Subcutaneous BID WC  . living well with diabetes book   Does not apply Once  . metoprolol      . pantoprazole (PROTONIX) IV  40 mg Intravenous QHS  . pneumococcal 23 valent vaccine  0.5 mL Intramuscular Tomorrow-1000  . polyethylene glycol  17 g Oral Once  . vancomycin  1,000 mg Intravenous Q8H  . DISCONTD: docusate sodium  100 mg Oral BID  . DISCONTD: ertapenem  1 g Intravenous  Q24H  . DISCONTD: insulin glargine  15 Units Subcutaneous QHS  . DISCONTD: insulin regular  0-10 Units Intravenous TID WC   Continuous Infusions:    . sodium chloride 75 mL/hr at 07/30/11 0501  . dextrose 5 % and 0.45% NaCl     PRN Meds:.dextrose, morphine, ondansetron (ZOFRAN) IV, ondansetron, oxyCODONE-acetaminophen Assessment/Plan: Patient Active Hospital Problem List: No active hospital problems.  Diabetes: Will increase Basal insulin  to 15 Units BID. Changae SSI to sensitive.   LOS: 2 days

## 2011-07-30 NOTE — Evaluation (Signed)
Physical Therapy Evaluation Patient Details Name: Frank Schneider MRN: [id number] DOB: 11-11-1975 Today's Date: 07/30/2011  Problem List:  Patient Active Problem List  Diagnoses  . Tobacco use    Past Medical History:  Past Medical History  Diagnosis Date  . Diabetes mellitus   . Hypertension   . Priapism   . Tobacco use    Past Surgical History:  Past Surgical History  Procedure Date  . Penectomy     PT Assessment/Plan/Recommendation PT Assessment Clinical Impression Statement: Patient with scrotal abcess presents with decreased mobility which can be addressed with nursing/girlfriend assist while in patient. No need for skilled PT intervention at this time.  Encouraged patient to walk twice a day at least during hospital stay with assist due to pain meds. PT Recommendation/Assessment: Patent does not need any further PT services No Skilled PT: Patient will have necessary level of assist by caregiver at discharge;Patient is supervision for all activity/mobility PT Recommendation Equipment Recommended: None recommended by PT PT Goals     PT Evaluation Precautions/Restrictions    Prior Functioning  Home Living Lives With: Family Type of Home: House Home Layout: One level Home Access: Stairs to enter Entrance Stairs-Rails: None Entrance Stairs-Number of Steps: 2-3 Home Adaptive Equipment: None Prior Function Level of Independence: Independent with basic ADLs;Independent with transfers;Independent with gait Cognition Cognition Arousal/Alertness: Awake/alert Overall Cognitive Status: Appears within functional limits for tasks assessed Orientation Level: Oriented X4 Sensation/Coordination   Extremity Assessment RLE Assessment RLE Assessment: Within Functional Limits LLE Assessment LLE Assessment: Within Functional Limits Mobility (including Balance) Bed Mobility Bed Mobility: Yes Supine to Sit: 6: Modified independent (Device/Increase time) Sit to Supine: 6:  Modified independent (Device/Increase time) Transfers Transfers: Yes Sit to Stand: 6: Modified independent (Device/Increase time) Stand to Sit: 6: Modified independent (Device/Increase time) Ambulation/Gait Ambulation/Gait: Yes Ambulation/Gait Assistance: 5: Supervision Ambulation/Gait Assistance Details (indicate cue type and reason): walked with girlfriend holding his hand and he pushed IV pole.  Visualized slight unseadiness stepping back in hall waiting on RN to fix IV, but self recovered Ambulation Distance (Feet): 270 Feet Assistive device: None Gait Pattern: Step-through pattern Gait velocity: WNL  Posture/Postural Control Posture/Postural Control: No significant limitations Exercise    End of Session PT - End of Session Activity Tolerance: Patient tolerated treatment well Patient left: in bed;with family/visitor present;with call bell in reach General Behavior During Session: [facility] for tasks performed Cognition: [facility] for tasks performed  [facility] 07/30/2011, 11:16 AM

## 2011-07-30 NOTE — Progress Notes (Signed)
Inpatient Diabetes Program Recommendations  AACE/ADA: New Consensus Statement on Inpatient Glycemic Control (2009)  Target Ranges:  Prepandial:   less than 140 mg/dL      Peak postprandial:   less than 180 mg/dL (1-2 hours)      Critically ill patients:  140 - 180 mg/dL    Results for JAMIN, PANTHER (MRN [id number]) as of 07/30/2011 13:38  Ref. Range 07/30/2011 07:20 07/30/2011 12:04  Glucose-Capillary Latest Range: 70-99 mg/dL 409 (H) 811 (H)    Inpatient Diabetes Program Recommendations Insulin - Basal: Please increase 70/30 insulin to 15 units bid with meals. Correction (SSI): Please decrease SSI to Sensitive scale tid (if adjustmets made to 70/30 insulin)  Note: Will follow. Ambrose Finland RN, MSN, CDE Diabetes Coordinator Inpatient Diabetes Program [phone]

## 2011-07-30 NOTE — Progress Notes (Signed)
2 Days Post-Op  Subjective: Stable. Alert. Low-grade scant. Heart rate 70. Comfortable except when dressing is changed, then there is a lot of pain. There has been no bleeding.  I appreciate input from physical therapy and wound and ostomy service. Appreciate input from diabetes coordination and nutritional management.  Appreciate internal medicine advice.  Objective: Vital signs in last 24 hours: Temp:  [97.3 F (36.3 C)-100.1 F (37.8 C)] 98.6 F (37 C) (04/04 0540) Pulse Rate:  [70-87] 70  (04/04 0540) Resp:  [18-20] 20  (04/04 0540) BP: (127-138)/(67-79) 130/76 mmHg (04/04 0130) SpO2:  [95 %-100 %] 99 % (04/04 0540) Last BM Date: 07/28/11  Intake/Output from previous day: 04/03 0701 - 04/04 0700 In: 3575.8 [P.O.:360; I.V.:2915.8; IV Piggyback:300] Out: 1600 [Urine:1600] Intake/Output this shift: Total I/O In: 2577 [I.V.:2577] Out: 1100 [Urine:1100]  General appearance: alert. Cooperative. No obvious distress. Skin: Skin color, texture, turgor normal. No rashes or lesions or perineal wound is open. Very tender. No purulence. No further necrosis. No drainage. Redressed by RN.  Lab Results:  Results for orders placed during the hospital encounter of 07/28/11 (from the past 24 hour(s))  GLUCOSE, CAPILLARY     Status: Abnormal   Collection Time   07/29/11  7:17 AM      Component Value Range   Glucose-Capillary 253 (*) 70 - 99 (mg/dL)   Comment 1 Notify RN     Comment 2 Documented in Chart    GLUCOSE, CAPILLARY     Status: Abnormal   Collection Time   07/29/11 11:54 AM      Component Value Range   Glucose-Capillary 172 (*) 70 - 99 (mg/dL)   Comment 1 Notify RN     Comment 2 Documented in Chart    GLUCOSE, CAPILLARY     Status: Abnormal   Collection Time   07/29/11  3:53 PM      Component Value Range   Glucose-Capillary 251 (*) 70 - 99 (mg/dL)   Comment 1 Notify RN     Comment 2 Documented in Chart    VANCOMYCIN, TROUGH     Status: Normal   Collection Time   07/29/11   9:20 PM      Component Value Range   Vancomycin Tr 10.1  10.0 - 20.0 (ug/mL)  GLUCOSE, CAPILLARY     Status: Abnormal   Collection Time   07/29/11  9:44 PM      Component Value Range   Glucose-Capillary 134 (*) 70 - 99 (mg/dL)  CBC     Status: Abnormal   Collection Time   07/30/11  4:31 AM      Component Value Range   WBC 12.4 (*) 4.0 - 10.5 (K/uL)   RBC 5.34  4.22 - 5.81 (MIL/uL)   Hemoglobin 10.5 (*) 13.0 - 17.0 (g/dL)   HCT 11.9 (*) 14.7 - 52.0 (%)   MCV 59.9 (*) 78.0 - 100.0 (fL)   MCH 19.7 (*) 26.0 - 34.0 (pg)   MCHC 32.8  30.0 - 36.0 (g/dL)   RDW 82.9  56.2 - 13.0 (%)   Platelets 273  150 - 400 (K/uL)  BASIC METABOLIC PANEL     Status: Abnormal   Collection Time   07/30/11  4:31 AM      Component Value Range   Sodium 134 (*) 135 - 145 (mEq/L)   Potassium 3.6  3.5 - 5.1 (mEq/L)   Chloride 96  96 - 112 (mEq/L)   CO2 29  19 - 32 (mEq/L)  Glucose, Bld 211 (*) 70 - 99 (mg/dL)   BUN 7  6 - 23 (mg/dL)   Creatinine, Ser 5.78  0.50 - 1.35 (mg/dL)   Calcium 8.3 (*) 8.4 - 10.5 (mg/dL)   GFR calc non Af Amer >90  >90 (mL/min)   GFR calc Af Amer >90  >90 (mL/min)     Studies/Results: @RISRSLT24 @     . bd getting started take home kit  1 kit Other Once  . clindamycin (CLEOCIN) IV  600 mg Intravenous Q8H  . ertapenem (INVANZ) IV  1 g Intravenous Q24H  . heparin  5,000 Units Subcutaneous Q8H  . insulin aspart  0-15 Units Subcutaneous TID WC  . insulin aspart protamine-insulin aspart  12 Units Subcutaneous BID WC  . living well with diabetes book   Does not apply Once  . metoprolol      . pantoprazole (PROTONIX) IV  40 mg Intravenous QHS  . pneumococcal 23 valent vaccine  0.5 mL Intramuscular Tomorrow-1000  . polyethylene glycol  17 g Oral Once  . vancomycin  1,500 mg Intravenous Q8H  . DISCONTD: insulin glargine  15 Units Subcutaneous QHS  . DISCONTD: vancomycin  1,000 mg Intravenous Q8H     Assessment/Plan: s/p Procedure(s): IRRIGATION AND DEBRIDEMENT PERIRECTAL  ABSCESS  Fournier's gangrene of perineum, status post debridement on. Infection and tissue necrosis are under good control at this point. Continue frequent dressing changes, pulsatile lavage. Will need visiting nurses to assist with wound management once he is discharged. For now, continue broad-spectrum antibiotics and inpatient wound care.  Insulin-dependent diabetes mellitus, poor control. Appreciate tried hospitalist management. Will need outpatient management plan.    LOS: 2 days    Lisha Vitale M. Derrell Lolling, M.D., [facility] General and Minimally invasive Surgery Breast and Colorectal Surgery Office:   [phone] Pager:   [phone]  07/30/2011  . .prob

## 2011-07-31 LAB — WOUND CULTURE: Gram Stain: NONE SEEN

## 2011-07-31 LAB — VANCOMYCIN, TROUGH: Vancomycin Tr: 23.4 ug/mL — ABNORMAL HIGH (ref 10.0–20.0)

## 2011-07-31 LAB — GLUCOSE, CAPILLARY
Glucose-Capillary: 289 mg/dL — ABNORMAL HIGH (ref 70–99)
Glucose-Capillary: 355 mg/dL — ABNORMAL HIGH (ref 70–99)

## 2011-07-31 MED ORDER — INSULIN ASPART 100 UNIT/ML ~~LOC~~ SOLN
3.0000 [IU] | Freq: Three times a day (TID) | SUBCUTANEOUS | Status: DC
  Administered 2011-08-01 – 2011-08-03 (×7): 3 [IU] via SUBCUTANEOUS

## 2011-07-31 MED ORDER — INSULIN ASPART 100 UNIT/ML ~~LOC~~ SOLN
0.0000 [IU] | Freq: Every day | SUBCUTANEOUS | Status: DC
  Administered 2011-08-02: 2 [IU] via SUBCUTANEOUS

## 2011-07-31 MED ORDER — INSULIN ASPART 100 UNIT/ML ~~LOC~~ SOLN
0.0000 [IU] | Freq: Three times a day (TID) | SUBCUTANEOUS | Status: DC
  Administered 2011-08-01: 11 [IU] via SUBCUTANEOUS
  Administered 2011-08-01: 2 [IU] via SUBCUTANEOUS
  Administered 2011-08-01: 3 [IU] via SUBCUTANEOUS
  Administered 2011-08-02: 8 [IU] via SUBCUTANEOUS
  Administered 2011-08-02: 5 [IU] via SUBCUTANEOUS
  Administered 2011-08-02: 3 [IU] via SUBCUTANEOUS
  Administered 2011-08-03: 5 [IU] via SUBCUTANEOUS
  Administered 2011-08-03: 3 [IU] via SUBCUTANEOUS

## 2011-07-31 MED ORDER — INSULIN ASPART PROT & ASPART (70-30 MIX) 100 UNIT/ML ~~LOC~~ SUSP
18.0000 [IU] | Freq: Two times a day (BID) | SUBCUTANEOUS | Status: DC
  Administered 2011-07-31 – 2011-08-02 (×4): 18 [IU] via SUBCUTANEOUS
  Filled 2011-07-31: qty 3

## 2011-07-31 MED ORDER — VANCOMYCIN HCL 1000 MG IV SOLR
1500.0000 mg | Freq: Two times a day (BID) | INTRAVENOUS | Status: DC
  Administered 2011-07-31 – 2011-08-02 (×5): 1500 mg via INTRAVENOUS
  Filled 2011-07-31 (×8): qty 1500

## 2011-07-31 NOTE — Progress Notes (Signed)
Patient is alert and oriented, vital signs are stable pt is afebrile this shift, pt medicated for pain in perirectal area with morphine 4 mg and percocet today, pt tolearted drsg changes well, pt has been up and ambulated, will continue to monitor Means, [employer] N RN 07-31-11 19:01pm

## 2011-07-31 NOTE — Progress Notes (Signed)
Subjective: Pt states that he feels much better and is in a much better mood today. Blood sugars continue to be elevated Objective: Filed Vitals:   07/31/11 0152 07/31/11 0548 07/31/11 1400 07/31/11 1800  BP: 133/83 156/80 145/80 168/91  Pulse: 80 66 94 108  Temp: 98 F (36.7 C) 98.7 F (37.1 C) 97.8 F (36.6 C) 97.7 F (36.5 C)  TempSrc: Oral Oral Oral Oral  Resp: 18 18 18 18   Height:      Weight:      SpO2: 100% 100% 100% 100%   Weight change:   Intake/Output Summary (Last 24 hours) at 07/31/11 1901 Last data filed at 07/31/11 1417  Gross per 24 hour  Intake   1845 ml  Output   1050 ml  Net    795 ml    General: Alert, awake, oriented x3, in no acute distress.  HEENT: [place]/AT [place]ER[place], EOMI Neck: Trachea midline,  no masses, no thyromegal,y no JVD, no carotid bruit OROPHARYNX:  Moist, No exudate/ erythema/lesions.  Heart: Regular rate and rhythm, without murmurs, rubs, gallops, PMI non-displaced, no heaves or thrills on palpation.  Lungs: Clear to auscultation, no wheezing or rhonchi noted. No increased vocal fremitus resonant to percussion    Lab Results:  Basename 07/30/11 0431 07/29/11 0434  NA 134* 133*  K 3.6 3.6  CL 96 96  CO2 29 29  GLUCOSE 211* 138*  BUN 7 6  CREATININE 1.00 0.91  CALCIUM 8.3* 8.5  MG -- 1.8  PHOS -- --   No results found for this basename: AST:2,ALT:2,ALKPHOS:2,BILITOT:2,PROT:2,ALBUMIN:2 in the last 72 hours No results found for this basename: LIPASE:2,AMYLASE:2 in the last 72 hours  Basename 07/30/11 0431 07/29/11 0434  WBC 12.4* 19.3*  NEUTROABS -- --  HGB 10.5* 11.0*  HCT 32.0* 33.9*  MCV 59.9* 60.0*  PLT 273 246   No results found for this basename: CKTOTAL:3,CKMB:3,CKMBINDEX:3,TROPONINI:3 in the last 72 hours No components found with this basename: POCBNP:3 No results found for this basename: DDIMER:2 in the last 72 hours No results found for this basename: HGBA1C:2 in the last 72 hours No results found for this basename:  CHOL:2,HDL:2,LDLCALC:2,TRIG:2,CHOLHDL:2,LDLDIRECT:2 in the last 72 hours No results found for this basename: TSH,T4TOTAL,FREET3,T3FREE,THYROIDAB in the last 72 hours No results found for this basename: VITAMINB12:2,FOLATE:2,FERRITIN:2,TIBC:2,IRON:2,RETICCTPCT:2 in the last 72 hours  Micro Results: Recent Results (from the past 240 hour(s))  SURGICAL PCR SCREEN     Status: Abnormal   Collection Time   07/28/11  4:46 PM      Component Value Range Status Comment   MRSA, PCR NEGATIVE  NEGATIVE  Final    Staphylococcus aureus POSITIVE (*) NEGATIVE  Final   ANAEROBIC CULTURE     Status: Normal (Preliminary result)   Collection Time   07/28/11  6:24 PM      Component Value Range Status Comment   Specimen Description PERITONEAL FOURNIER'S GANGRENE   Final    Special Requests NONE   Final    Gram Stain     Final    Value: NO WBC SEEN     NO SQUAMOUS EPITHELIAL CELLS SEEN     RARE GRAM POSITIVE COCCI IN PAIRS     RARE GRAM POSITIVE RODS   Culture     Final    Value: NO ANAEROBES ISOLATED; CULTURE IN PROGRESS FOR 5 DAYS   Report Status PENDING   Incomplete   WOUND CULTURE     Status: Normal   Collection Time   07/28/11  6:24 PM      Component Value Range Status Comment   Specimen Description PERITONEAL FOURNIER'S GANGRENE   Final    Special Requests NONE   Final    Gram Stain     Final    Value: NO WBC SEEN     NO SQUAMOUS EPITHELIAL CELLS SEEN     RARE GRAM POSITIVE COCCI IN PAIRS     RARE GRAM POSITIVE RODS   Culture     Final    Value: MULTIPLE ORGANISMS PRESENT, NONE PREDOMINANT     Note: NO STAPHYLOCOCCUS AUREUS ISOLATED NO GROUP A STREP (S.PYOGENES) ISOLATED   Report Status 07/31/2011 FINAL   Final     Studies/Results: Ct Pelvis W Contrast  07/28/2011  *RADIOLOGY REPORT*  Clinical Data:  Abscess.  Hyperglycemia.  CT PELVIS WITH CONTRAST  Technique:  Multidetector CT imaging of the pelvis was performed using the standard protocol following the bolus administration of intravenous  contrast.  Contrast:   100 ml Omnipaque 300  Comparison:   None.  Findings:  Diffuse inflammatory changes are noted along the right parasagittal perineum and into the posterior gluteal fold.  There is gas within the soft tissues as well, compatible with infection and abscess.  Mild edematous changes are noted on the left but without gas.  The edematous changes and gas extend to the base of the scrotum.  There is no gas within the scrotum or along the penis.  A rectal balloon is in place.  Contrast fills the visualized descending colon.  The appendix is visualized and normal. Atherosclerotic calcifications are present within the iliac vessels without aneurysm.  The urinary bladder is normal.  The visualized small bowel is normal.  There is no significant free fluid. Bilateral inguinal adenopathy is likely reactive.  The bone windows are unremarkable.  IMPRESSION:  1.  Extensive edematous changes, fluid and gas within the right parasagittal perineum compatible with abscess.  This could represent early Fornier's gangrene. 2.  Multiple bilateral inguinal lymph nodes are likely reactive. 3.  No significant extension into the pelvis.  Original Report Authenticated By: Jamesetta Orleans. MATTERN, M.D.    Medications: I have reviewed the patient's current medications. Scheduled Meds:   . bd getting started take home kit  1 kit Other Once  . clindamycin (CLEOCIN) IV  600 mg Intravenous Q8H  . ertapenem (INVANZ) IV  1 g Intravenous Q24H  . heparin  5,000 Units Subcutaneous Q8H  . insulin aspart  0-15 Units Subcutaneous TID WC  . insulin aspart protamine-insulin aspart  12 Units Subcutaneous BID WC  . living well with diabetes book   Does not apply Once  . metoprolol      . pantoprazole (PROTONIX) IV  40 mg Intravenous QHS  . pneumococcal 23 valent vaccine  0.5 mL Intramuscular Tomorrow-1000  . polyethylene glycol  17 g Oral Once  . vancomycin  1,000 mg Intravenous Q8H  . DISCONTD: docusate sodium  100 mg Oral  BID  . DISCONTD: ertapenem  1 g Intravenous Q24H  . DISCONTD: insulin glargine  15 Units Subcutaneous QHS  . DISCONTD: insulin regular  0-10 Units Intravenous TID WC   Continuous Infusions:    . sodium chloride 50 mL/hr at 07/31/11 1557  . dextrose 5 % and 0.45% NaCl     PRN Meds:.dextrose, morphine, ondansetron (ZOFRAN) IV, ondansetron, oxyCODONE-acetaminophen Assessment/Plan: Patient Active Hospital Problem List: No active hospital problems.  Diabetes: Will increase Basal insulin to 18 Units BID. Changae SSI to moderate.  LOS: 3 days

## 2011-07-31 NOTE — Progress Notes (Signed)
I agree with evaluation and treatment plan as outlined by Zola Button, PA   Angelia Mould. Derrell Lolling, M.D., [facility] General and Minimally invasive Surgery Breast and Colorectal Surgery Office:   [phone] Pager:   [phone]

## 2011-07-31 NOTE — Progress Notes (Signed)
Inpatient Diabetes Program Recommendations  AACE/ADA: New Consensus Statement on Inpatient Glycemic Control (2009)  Target Ranges:  Prepandial:   less than 140 mg/dL      Peak postprandial:   less than 180 mg/dL (1-2 hours)      Critically ill patients:  140 - 180 mg/dL    Results for FLOR, HOUDESHELL (MRN [id number]) as of 07/31/2011 13:53  Ref. Range 07/31/2011 07:56 07/31/2011 12:16  Glucose-Capillary Latest Range: 70-99 mg/dL 409 (H) 811 (H)    Inpatient Diabetes Program Recommendations Insulin - Basal: Please continue upward titration of 70/30 insulin. Correction (SSI): Currently on Sensitive SSI.  Note: Will follow. Ambrose Finland RN, MSN, CDE Diabetes Coordinator Inpatient Diabetes Program [phone]

## 2011-07-31 NOTE — Discharge Instructions (Signed)
Peri-Rectal Abscess Your caregiver has diagnosed you as having a peri-rectal abscess. This is an infected area near the rectum that is filled with pus. If the abscess is near the surface of the skin, your caregiver may open (incise) the area and drain the pus. HOME CARE INSTRUCTIONS  Wet to dry dressings using normal saline and Kling for packing; twice a day or more if needed.  If your abscess was opened up and drained. A small piece of gauze may be placed in the opening so that it can drain. Do not remove the gauze unless directed by your caregiver.   A loose dressing may be placed over the abscess site. Change the dressing as often as necessary to keep it clean and dry.   After the drain is removed, the area may be washed with a gentle antiseptic (soap) four times per day.   A warm sitz bath, warm packs or heating pad may be used for pain relief, taking care not to burn yourself.   Return for a wound check in 1 day or as directed.   An "inflatable doughnut" may be used for sitting with added comfort. These can be purchased at a drugstore or medical supply house.   To reduce pain and straining with bowel movements, eat a high fiber diet with plenty of fruits and vegetables. Use stool softeners as recommended by your caregiver. This is especially important if narcotic type pain medications were prescribed as these may cause marked constipation.   Only take over-the-counter or prescription medicines for pain, discomfort, or fever as directed by your caregiver.  SEEK IMMEDIATE MEDICAL CARE IF:   You have increasing pain that is not controlled by medication.   There is increased inflammation (redness), swelling, bleeding, or drainage from the area.   An oral temperature above 102 F (38.9 C) develops.   You develop chills or generalized malaise (feel lethargic or feel "washed out").   You develop any new symptoms (problems) you feel may be related to your present problem.  Document  Released: 04/10/2000 Document Revised: 04/02/2011 Document Reviewed: 04/10/2008 [facility] Patient Information 2012 [place], [place].

## 2011-07-31 NOTE — Progress Notes (Signed)
3 Days Post-Op  Subjective:  Still very painful.  Dressing removed and it looks very clean.  Getting pulse massage irrigation also Objective: Vital signs in last 24 hours: Temp:  [98 F (36.7 C)-99.4 F (37.4 C)] 98.7 F (37.1 C) (04/05 0548) Pulse Rate:  [66-99] 66  (04/05 0548) Resp:  [18-20] 18  (04/05 0548) BP: (133-156)/(67-84) 156/80 mmHg (04/05 0548) SpO2:  [97 %-100 %] 100 % (04/05 0548) Last BM Date: 07/30/11 Tm 100.1 VSS, No labs,  Intake/Output from previous day: 04/04 0701 - 04/05 0700 In: 810 [P.O.:360; I.V.:450] Out: 1050 [Urine:1050] Intake/Output this shift:    General appearance: alert, cooperative, no distress and Hurts a great deal when you take the dressing down. Incision/Wound:Site is clean and looks good.  Lab Results:   [facility] 07/30/11 0431 07/29/11 0434  WBC 12.4* 19.3*  HGB 10.5* 11.0*  HCT 32.0* 33.9*  PLT 273 246    BMET  Basename 07/30/11 0431 07/29/11 0434  NA 134* 133*  K 3.6 3.6  CL 96 96  CO2 29 29  GLUCOSE 211* 138*  BUN 7 6  CREATININE 1.00 0.91  CALCIUM 8.3* 8.5   PT/INR  Basename 07/28/11 1420  LABPROT 15.1  INR 1.17     Lab 07/28/11 1420 07/26/11 1335  AST 12 16  ALT 13 9  ALKPHOS 95 95  BILITOT 0.6 0.8  PROT 7.4 6.8  ALBUMIN 3.2* 3.3*     Lipase  No results found for this basename: lipase     Studies/Results: No results found.  Medications:    . clindamycin (CLEOCIN) IV  600 mg Intravenous Q8H  . docusate sodium  100 mg Oral BID  . ertapenem (INVANZ) IV  1 g Intravenous Q24H  . feeding supplement  237 mL Oral BID BM  . heparin  5,000 Units Subcutaneous Q8H  . insulin aspart  0-5 Units Subcutaneous QHS  . insulin aspart  0-9 Units Subcutaneous TID WC  . insulin aspart protamine-insulin aspart  15 Units Subcutaneous BID WC  . pantoprazole  40 mg Oral Q1200  . polyethylene glycol  17 g Oral Once  . vancomycin  1,500 mg Intravenous Q12H  . DISCONTD: insulin aspart  0-15 Units Subcutaneous TID WC    . DISCONTD: insulin aspart protamine-insulin aspart  12 Units Subcutaneous BID WC  . DISCONTD: vancomycin  1,500 mg Intravenous Q8H    Assessment/Plan Fournier's gangrene of perineum, s/p Debridement of skin, subcutaneous tissue, and muscle of the perineum, 8 cm x 24 cm dimension 07/28/11 Dr. Derrell Lolling (Culture showed multiple organisms, no staph, no anaerobes noted on cultures.) on Cleocin and Vancomycin IV. 2.Uncontrolled diabetes 5 years or more  3.Hypertension  4.ETOH use  5. Tobacco use   Plan:  Continue dressing changes, wound hydrotherapy, antibiotics.   LOS: 3 days    Frank Schneider 07/31/2011

## 2011-07-31 NOTE — Progress Notes (Signed)
ANTIBIOTIC CONSULT NOTE - FOLLOW UP  Pharmacy Consult for Vancomycin Per MD: Pincus Sanes, Clindamycin Indication: Perirectal abscess, Fornier's gangrene  No Known Allergies  Patient Measurements: Height: 5\' 10"  (177.8 cm) Weight: 190 lb (86.183 kg) IBW/kg (Calculated) : 73    Vital Signs: Temp: 98.7 F (37.1 C) (04/05 0548) Temp src: Oral (04/05 0548) BP: 156/80 mmHg (04/05 0548) Pulse Rate: 66  (04/05 0548) Intake/Output from previous day: 04/04 0701 - 04/05 0700 In: 810 [P.O.:360; I.V.:450] Out: 1050 [Urine:1050] Intake/Output from this shift:    Labs:  Basename 07/30/11 0431 07/29/11 0434 07/28/11 1205  WBC 12.4* 19.3* 24.5*  HGB 10.5* 11.0* 12.7*  PLT 273 246 235  LABCREA -- -- --  CREATININE 1.00 0.91 0.95   Estimated Creatinine Clearance: 106.5 ml/min (by C-G formula based on Cr of 1).  Basename 07/31/11 0530 07/29/11 2120  VANCOTROUGH 23.4* 10.1  VANCOPEAK -- --  Drue Dun -- --  GENTTROUGH -- --  GENTPEAK -- --  GENTRANDOM -- --  TOBRATROUGH -- --  TOBRAPEAK -- --  TOBRARND -- --  AMIKACINPEAK -- --  AMIKACINTROU -- --  AMIKACIN -- --     Microbiology: Recent Results (from the past 720 hour(s))  SURGICAL PCR SCREEN     Status: Abnormal   Collection Time   07/28/11  4:46 PM      Component Value Range Status Comment   MRSA, PCR NEGATIVE  NEGATIVE  Final    Staphylococcus aureus POSITIVE (*) NEGATIVE  Final   ANAEROBIC CULTURE     Status: Normal (Preliminary result)   Collection Time   07/28/11  6:24 PM      Component Value Range Status Comment   Specimen Description PERITONEAL FOURNIER'S GANGRENE   Final    Special Requests NONE   Final    Gram Stain     Final    Value: NO WBC SEEN     NO SQUAMOUS EPITHELIAL CELLS SEEN     RARE GRAM POSITIVE COCCI IN PAIRS     RARE GRAM POSITIVE RODS   Culture     Final    Value: NO ANAEROBES ISOLATED; CULTURE IN PROGRESS FOR 5 DAYS   Report Status PENDING   Incomplete   WOUND CULTURE     Status: Normal  (Preliminary result)   Collection Time   07/28/11  6:24 PM      Component Value Range Status Comment   Specimen Description PERITONEAL FOURNIER'S GANGRENE   Final    Special Requests NONE   Final    Gram Stain     Final    Value: NO WBC SEEN     NO SQUAMOUS EPITHELIAL CELLS SEEN     RARE GRAM POSITIVE COCCI IN PAIRS     RARE GRAM POSITIVE RODS   Culture Culture reincubated for better growth   Final    Report Status PENDING   Incomplete     Anti-infectives     Start     Dose/Rate Route Frequency Ordered Stop   07/29/11 2245   vancomycin (VANCOCIN) 1,500 mg in sodium chloride 0.9 % 500 mL IVPB        1,500 mg 250 mL/hr over 120 Minutes Intravenous 3 times per day 07/29/11 2235     07/29/11 1600   ertapenem (INVANZ) 1 g in sodium chloride 0.9 % 50 mL IVPB        1 g 100 mL/hr over 30 Minutes Intravenous Every 24 hours 07/28/11 2035     07/28/11 2200  vancomycin (VANCOCIN) IVPB 1000 mg/200 mL premix  Status:  Discontinued        1,000 mg 200 mL/hr over 60 Minutes Intravenous Every 8 hours 07/28/11 1445 07/29/11 2234   07/28/11 1630   clindamycin (CLEOCIN) IVPB 600 mg        600 mg 100 mL/hr over 30 Minutes Intravenous 3 times per day 07/28/11 1616     07/28/11 1500   vancomycin (VANCOCIN) IVPB 1000 mg/200 mL premix        1,000 mg 200 mL/hr over 60 Minutes Intravenous  Once 07/28/11 1432 07/28/11 1749   07/28/11 1445   ertapenem (INVANZ) 1 g in sodium chloride 0.9 % 50 mL IVPB  Status:  Discontinued        1 g 100 mL/hr over 30 Minutes Intravenous Every 24 hours 07/28/11 1432 07/28/11 2045   07/28/11 1430   gentamicin (GARAMYCIN) IVPB 100 mg  Status:  Discontinued        100 mg 200 mL/hr over 30 Minutes Intravenous  Once 07/28/11 1344 07/28/11 1512   07/28/11 1345   metroNIDAZOLE (FLAGYL) IVPB 500 mg  Status:  Discontinued        500 mg 100 mL/hr over 60 Minutes Intravenous  Once 07/28/11 1344 07/28/11 1512   07/28/11 1200   clindamycin (CLEOCIN) IVPB 600 mg        600  mg 100 mL/hr over 30 Minutes Intravenous  Once 07/28/11 1152 07/28/11 1326          Assessment:  Day #4 Vancomycin, Ertapenem, Clindamycin for Fournier's s/p I&D 07/28/11.  Vancomycin trough 23.4 is > goal  Renal function stable, WBC improved, Tm 99.4  Wound cultures pending  Goal of Therapy:  Vancomycin trough level 15-20 mcg/ml  Plan:   Decrease vancomycin 1500mg  IV q12  Recheck trough at steady state if continues (length of therapy?)   Loralee Pacas, PharmD, BCPS Pager: [phone] 07/31/2011,7:08 AM

## 2011-07-31 NOTE — Progress Notes (Signed)
07/31/11 1434  Subjective Assessment  Subjective pt positioned himself in prone for treatment  Patient and Family Stated Goals Heal wound  Evaluation and Treatment  Evaluation and Treatment Procedures Explained to Patient/Family Yes  Evaluation and Treatment Procedures agreed to  Wound 07/29/11 Scrotum s/p surgical I&D of scrotum -- Fournier's Gangrene  Date First Assessed/Time First Assessed: 07/29/11 1135   Location: Scrotum  Wound Description (Comments): s/p surgical I&D of scrotum -- Fournier's Gangrene  Present on Admission: Yes  Site / Wound Assessment Pink;Yellow;Frank Schneider;Red  % Wound base Red or Granulating 95%  % Wound base Yellow 0%  % Wound base Other (Comment) 5% (cauterized vessels)  Peri-wound Assessment Edema  Drainage Amount Minimal  Drainage Description Serosanguineous  Non-staged Wound Description Not applicable  Treatment Cleansed;Hydrotherapy (Pulse lavage)  Dressing Type ABD;Moist to dry;Mesh briefs  Dressing Changed Changed  Dressing Status Clean;Dry;Intact  Incision 07/28/11 Perineum Other (Comment)  Date First Assessed/Time First Assessed: 07/28/11 1850   Location: Perineum  Location Orientation: Other (Comment)  Site / Wound Assessment Clean;Dry;Painful  Margins Unattacted edges (unapproximated)  Closure None  Drainage Amount Minimal  Drainage Description Serosanguineous  Dressing Type ABD  Dressing Dry;Intact  Hydrotherapy  Pulsed Lavage with Suction (psi) 4 psi  Pulsed Lavage Tip Tip with splash shield  Pulsed lavage therapy - wound location scrotum  Wound Therapy - Assess/Plan/Recommendations  Wound Therapy - Clinical Statement wound appears to be healing.  Pt continues to have significant pain with treatment and dressing change  Wound Therapy - Functional Problem List Wound location difficult to access  Factors Delaying/Impairing Wound Healing Diabetes Mellitus;Infection - systemic/local;Substance abuse  Hydrotherapy Plan Debridement;Dressing  change;Patient/family education;Pulsatile lavage with suction  Wound Therapy - Frequency 6X / week  Wound Therapy - Follow Up Recommendations Home health RN  Wound Therapy Goals - Improve the function of patient's integumentary system by progressing the wound(s) through the phases of wound healing by:  Decrease Necrotic Tissue to 10  Decrease Necrotic Tissue - Progress Progressing toward goal  Increase Granulation Tissue to 90  Increase Granulation Tissue - Progress Met  Decrease Length/Width/Depth by (cm) decrease length by 1 cm  Decrease Length/Width/Depth - Progress Goal set today  Improve Drainage Characteristics Other (comment) (none)  Improve Drainage Characteristics - Progress Goal set today  Patient/Family will be able to  verbalize cleansing and dessing change

## 2011-08-01 LAB — GLUCOSE, CAPILLARY
Glucose-Capillary: 123 mg/dL — ABNORMAL HIGH (ref 70–99)
Glucose-Capillary: 177 mg/dL — ABNORMAL HIGH (ref 70–99)

## 2011-08-01 NOTE — Progress Notes (Signed)
Subjective: Pt states that he feels much better and is in a much better mood today. Blood sugars 127 at lunch today. Objective: Filed Vitals:   07/31/11 1800 07/31/11 2216 08/01/11 0500 08/01/11 1418  BP: 168/91 155/75 141/84 160/83  Pulse: 108 70 66 69  Temp: 97.7 F (36.5 C) 98.7 F (37.1 C) 97.9 F (36.6 C)   TempSrc: Oral Oral Oral   Resp: 18 18 18 20   Height:      Weight:      SpO2: 100% 99% 98% 99%   Weight change:   Intake/Output Summary (Last 24 hours) at 08/01/11 1602 Last data filed at 08/01/11 1500  Gross per 24 hour  Intake   1570 ml  Output    500 ml  Net   1070 ml    General: Alert, awake, oriented x3, in no acute distress.  HEENT: [place]/AT PEERL, EOMI Neck: Trachea midline,  no masses, no thyromegal,y no JVD, no carotid bruit OROPHARYNX:  Moist, No exudate/ erythema/lesions.  Heart: Regular rate and rhythm, without murmurs, rubs, gallops, PMI non-displaced, no heaves or thrills on palpation.  Lungs: Clear to auscultation, no wheezing or rhonchi noted. No increased vocal fremitus resonant to percussion    Lab Results:  Basename 07/30/11 0431  NA 134*  K 3.6  CL 96  CO2 29  GLUCOSE 211*  BUN 7  CREATININE 1.00  CALCIUM 8.3*  MG --  PHOS --   No results found for this basename: AST:2,ALT:2,ALKPHOS:2,BILITOT:2,PROT:2,ALBUMIN:2 in the last 72 hours No results found for this basename: LIPASE:2,AMYLASE:2 in the last 72 hours  Basename 07/30/11 0431  WBC 12.4*  NEUTROABS --  HGB 10.5*  HCT 32.0*  MCV 59.9*  PLT 273   No results found for this basename: CKTOTAL:3,CKMB:3,CKMBINDEX:3,TROPONINI:3 in the last 72 hours No components found with this basename: POCBNP:3 No results found for this basename: DDIMER:2 in the last 72 hours No results found for this basename: HGBA1C:2 in the last 72 hours No results found for this basename: CHOL:2,HDL:2,LDLCALC:2,TRIG:2,CHOLHDL:2,LDLDIRECT:2 in the last 72 hours No results found for this basename:  TSH,T4TOTAL,FREET3,T3FREE,THYROIDAB in the last 72 hours No results found for this basename: VITAMINB12:2,FOLATE:2,FERRITIN:2,TIBC:2,IRON:2,RETICCTPCT:2 in the last 72 hours  Micro Results: Recent Results (from the past 240 hour(s))  SURGICAL PCR SCREEN     Status: Abnormal   Collection Time   07/28/11  4:46 PM      Component Value Range Status Comment   MRSA, PCR NEGATIVE  NEGATIVE  Final    Staphylococcus aureus POSITIVE (*) NEGATIVE  Final   ANAEROBIC CULTURE     Status: Normal (Preliminary result)   Collection Time   07/28/11  6:24 PM      Component Value Range Status Comment   Specimen Description PERITONEAL FOURNIER'S GANGRENE   Final    Special Requests NONE   Final    Gram Stain     Final    Value: NO WBC SEEN     NO SQUAMOUS EPITHELIAL CELLS SEEN     RARE GRAM POSITIVE COCCI IN PAIRS     RARE GRAM POSITIVE RODS   Culture     Final    Value: NO ANAEROBES ISOLATED; CULTURE IN PROGRESS FOR 5 DAYS   Report Status PENDING   Incomplete   WOUND CULTURE     Status: Normal   Collection Time   07/28/11  6:24 PM      Component Value Range Status Comment   Specimen Description PERITONEAL FOURNIER'S GANGRENE   Final  Special Requests NONE   Final    Gram Stain     Final    Value: NO WBC SEEN     NO SQUAMOUS EPITHELIAL CELLS SEEN     RARE GRAM POSITIVE COCCI IN PAIRS     RARE GRAM POSITIVE RODS   Culture     Final    Value: MULTIPLE ORGANISMS PRESENT, NONE PREDOMINANT     Note: NO STAPHYLOCOCCUS AUREUS ISOLATED NO GROUP A STREP (S.PYOGENES) ISOLATED   Report Status 07/31/2011 FINAL   Final     Studies/Results: Ct Pelvis W Contrast  07/28/2011  *RADIOLOGY REPORT*  Clinical Data:  Abscess.  Hyperglycemia.  CT PELVIS WITH CONTRAST  Technique:  Multidetector CT imaging of the pelvis was performed using the standard protocol following the bolus administration of intravenous contrast.  Contrast:   100 ml Omnipaque 300  Comparison:   None.  Findings:  Diffuse inflammatory changes are noted  along the right parasagittal perineum and into the posterior gluteal fold.  There is gas within the soft tissues as well, compatible with infection and abscess.  Mild edematous changes are noted on the left but without gas.  The edematous changes and gas extend to the base of the scrotum.  There is no gas within the scrotum or along the penis.  A rectal balloon is in place.  Contrast fills the visualized descending colon.  The appendix is visualized and normal. Atherosclerotic calcifications are present within the iliac vessels without aneurysm.  The urinary bladder is normal.  The visualized small bowel is normal.  There is no significant free fluid. Bilateral inguinal adenopathy is likely reactive.  The bone windows are unremarkable.  IMPRESSION:  1.  Extensive edematous changes, fluid and gas within the right parasagittal perineum compatible with abscess.  This could represent early Fornier's gangrene. 2.  Multiple bilateral inguinal lymph nodes are likely reactive. 3.  No significant extension into the pelvis.  Original Report Authenticated By: Jamesetta Orleans. MATTERN, M.D.    Medications: I have reviewed the patient's current medications. Scheduled Meds:   . bd getting started take home kit  1 kit Other Once  . clindamycin (CLEOCIN) IV  600 mg Intravenous Q8H  . ertapenem (INVANZ) IV  1 g Intravenous Q24H  . heparin  5,000 Units Subcutaneous Q8H  . insulin aspart  0-15 Units Subcutaneous TID WC  . insulin aspart protamine-insulin aspart  12 Units Subcutaneous BID WC  . living well with diabetes book   Does not apply Once  . metoprolol      . pantoprazole (PROTONIX) IV  40 mg Intravenous QHS  . pneumococcal 23 valent vaccine  0.5 mL Intramuscular Tomorrow-1000  . polyethylene glycol  17 g Oral Once  . vancomycin  1,000 mg Intravenous Q8H  . DISCONTD: docusate sodium  100 mg Oral BID  . DISCONTD: ertapenem  1 g Intravenous Q24H  . DISCONTD: insulin glargine  15 Units Subcutaneous QHS  .  DISCONTD: insulin regular  0-10 Units Intravenous TID WC   Continuous Infusions:    . sodium chloride 50 mL/hr at 07/31/11 1557  . dextrose 5 % and 0.45% NaCl     PRN Meds:.dextrose, morphine, ondansetron (ZOFRAN) IV, ondansetron, oxyCODONE-acetaminophen Assessment/Plan: Patient Active Hospital Problem List: No active hospital problems.  Diabetes: Will continue current regimen. Pt has been instruction in and demonstrated competence in administering insulin.    LOS: 4 days

## 2011-08-01 NOTE — Progress Notes (Signed)
Patient ID: Frank Schneider, male   DOB: 1975-05-03, 36 y.o.   MRN: [id number] 4 Days Post-Op  Subjective: Comfortable without complaints  Objective: Vital signs in last 24 hours: Temp:  [97.7 F (36.5 C)-98.7 F (37.1 C)] 97.9 F (36.6 C) (04/06 0500) Pulse Rate:  [66-108] 66  (04/06 0500) Resp:  [18] 18  (04/06 0500) BP: (141-168)/(75-91) 141/84 mmHg (04/06 0500) SpO2:  [98 %-100 %] 98 % (04/06 0500) Last BM Date: 07/31/11  Intake/Output from previous day: 04/05 0701 - 04/06 0700 In: 2495 [I.V.:2445; IV Piggyback:50] Out: 1000 [Urine:1000] Intake/Output this shift:    General appearance: alert and no distress Incision/Wound:not examined today, hydrotherapy scheduled for later this morning  Lab Results:   [facility] 07/30/11 0431  WBC 12.4*  HGB 10.5*  HCT 32.0*  PLT 273   BMET  Basename 07/30/11 0431  NA 134*  K 3.6  CL 96  CO2 29  GLUCOSE 211*  BUN 7  CREATININE 1.00  CALCIUM 8.3*   CBG (last 3)   Basename 07/31/11 2210 07/31/11 1724 07/31/11 1216  GLUCAP 147* 231* 355*     Studies/Results: No results found.  Anti-infectives: Anti-infectives     Start     Dose/Rate Route Frequency Ordered Stop   07/31/11 2000   vancomycin (VANCOCIN) 1,500 mg in sodium chloride 0.9 % 500 mL IVPB        1,500 mg 250 mL/hr over 120 Minutes Intravenous Every 12 hours 07/31/11 0721     07/29/11 2245   vancomycin (VANCOCIN) 1,500 mg in sodium chloride 0.9 % 500 mL IVPB  Status:  Discontinued        1,500 mg 250 mL/hr over 120 Minutes Intravenous 3 times per day 07/29/11 2235 07/31/11 0720   07/29/11 1600   ertapenem (INVANZ) 1 g in sodium chloride 0.9 % 50 mL IVPB        1 g 100 mL/hr over 30 Minutes Intravenous Every 24 hours 07/28/11 2035     07/28/11 2200   vancomycin (VANCOCIN) IVPB 1000 mg/200 mL premix  Status:  Discontinued        1,000 mg 200 mL/hr over 60 Minutes Intravenous Every 8 hours 07/28/11 1445 07/29/11 2234   07/28/11 1630   clindamycin (CLEOCIN)  IVPB 600 mg        600 mg 100 mL/hr over 30 Minutes Intravenous 3 times per day 07/28/11 1616     07/28/11 1500   vancomycin (VANCOCIN) IVPB 1000 mg/200 mL premix        1,000 mg 200 mL/hr over 60 Minutes Intravenous  Once 07/28/11 1432 07/28/11 1749   07/28/11 1445   ertapenem (INVANZ) 1 g in sodium chloride 0.9 % 50 mL IVPB  Status:  Discontinued        1 g 100 mL/hr over 30 Minutes Intravenous Every 24 hours 07/28/11 1432 07/28/11 2045   07/28/11 1430   gentamicin (GARAMYCIN) IVPB 100 mg  Status:  Discontinued        100 mg 200 mL/hr over 30 Minutes Intravenous  Once 07/28/11 1344 07/28/11 1512   07/28/11 1345   metroNIDAZOLE (FLAGYL) IVPB 500 mg  Status:  Discontinued        500 mg 100 mL/hr over 60 Minutes Intravenous  Once 07/28/11 1344 07/28/11 1512   07/28/11 1200   clindamycin (CLEOCIN) IVPB 600 mg        600 mg 100 mL/hr over 30 Minutes Intravenous  Once 07/28/11 1152 07/28/11 1326  Assessment/Plan: s/p Procedure(s): IRRIGATION AND DEBRIDEMENT PERIRECTAL ABSCESS Wound has been cleaning up well, for hydrotherapy today. Blood sugars under better control, medicine following.   LOS: 4 days    Yamina Lenis T 08/01/2011

## 2011-08-01 NOTE — Progress Notes (Signed)
08/01/11 1205  Subjective Assessment  Subjective "Is it healing up"  Evaluation and Treatment  Evaluation and Treatment Procedures Explained to Patient/Family Yes  Evaluation and Treatment Procedures agreed to  Wound 07/29/11 Scrotum s/p surgical I&D of scrotum -- Fournier's Gangrene  Date First Assessed/Time First Assessed: 07/29/11 1135   Location: Scrotum  Wound Description (Comments): s/p surgical I&D of scrotum -- Fournier's Gangrene  Present on Admission: Yes  Site / Wound Assessment Pink;Red  % Wound base Red or Granulating 100%  % Wound base Yellow 0%  % Wound base Other (Comment) 0% (cauterized vessels)  Peri-wound Assessment Edema;Intact  Drainage Amount Minimal  Drainage Description Serous  Treatment Cleansed;Hydrotherapy (Pulse lavage)  Dressing Type ABD;Moist to dry;Mesh briefs  Dressing Changed Changed  Dressing Status Clean;Dry;Intact  Hydrotherapy  Pulsed Lavage with Suction (psi) 4 psi  Pulsed Lavage Tip Tip with splash shield  Pulsed lavage therapy - wound location scrotum  Wound Therapy - Assess/Plan/Recommendations  Wound Therapy - Clinical Statement wound appears to be healing.  Pt continues to have significant pain with treatment and dressing change  Wound Therapy - Functional Problem List Wound location difficult to access  Factors Delaying/Impairing Wound Healing Diabetes Mellitus;Infection - systemic/local;Substance abuse  Hydrotherapy Plan Debridement;Dressing change;Patient/family education;Pulsatile lavage with suction  Wound Therapy - Frequency 6X / week  Wound Therapy - Follow Up Recommendations Home health RN  Wound Therapy Goals - Improve the function of patient's integumentary system by progressing the wound(s) through the phases of wound healing by:  Decrease Necrotic Tissue to 10  Decrease Necrotic Tissue - Progress Met  Increase Granulation Tissue to 90  Increase Granulation Tissue - Progress Met  Decrease Length/Width/Depth by (cm) decrease  length by 1 cm  Decrease Length/Width/Depth - Progress Progressing toward goal  Improve Drainage Characteristics Other (comment) (none)  Improve Drainage Characteristics - Progress Progressing toward goal  Patient/Family will be able to  verbalize cleansing and dessing change  Patient/Family Instruction Goal - Progress Progressing toward goal

## 2011-08-02 LAB — CBC
Hemoglobin: 11 g/dL — ABNORMAL LOW (ref 13.0–17.0)
MCHC: 31.9 g/dL (ref 30.0–36.0)
RDW: 15.3 % (ref 11.5–15.5)
WBC: 9.1 10*3/uL (ref 4.0–10.5)

## 2011-08-02 LAB — GLUCOSE, CAPILLARY

## 2011-08-02 LAB — BASIC METABOLIC PANEL
Chloride: 98 mEq/L (ref 96–112)
GFR calc Af Amer: >90 mL/min (ref >90)
GFR calc non Af Amer: >90 mL/min (ref >90)
Potassium: 3.6 mEq/L (ref 3.5–5.1)
Sodium: 135 mEq/L (ref 135–145)

## 2011-08-02 LAB — ANAEROBIC CULTURE: Gram Stain: NONE SEEN

## 2011-08-02 MED ORDER — INSULIN ASPART PROT & ASPART (70-30 MIX) 100 UNIT/ML ~~LOC~~ SUSP
20.0000 [IU] | Freq: Two times a day (BID) | SUBCUTANEOUS | Status: DC
  Administered 2011-08-02 – 2011-08-03 (×2): 20 [IU] via SUBCUTANEOUS

## 2011-08-02 NOTE — Progress Notes (Signed)
Patient ID: Frank Schneider, male   DOB: Sep 20, 1975, 36 y.o.   MRN: [id number] Patient ID: Frank Schneider, male   DOB: [birth year], 36 y.o.   MRN: [id number] 5 Days Post-Op  Subjective: Comfortable without complaints  Objective: Vital signs in last 24 hours: Temp:  [97.7 F (36.5 C)-98.5 F (36.9 C)] 97.7 F (36.5 C) (04/07 0525) Pulse Rate:  [56-69] 56  (04/07 0525) Resp:  [18-20] 18  (04/07 0525) BP: (152-160)/(83-93) 154/90 mmHg (04/07 0525) SpO2:  [98 %-99 %] 98 % (04/07 0525) Last BM Date: 08/01/11  Intake/Output from previous day: 04/06 0701 - 04/07 0700 In: 2553.3 [P.O.:320; I.V.:1083.3; IV Piggyback:1150] Out: 925 [Urine:925] Intake/Output this shift:    General appearance: alert and no distress Incision/Wound:not examined today, hydrotherapy scheduled for later this morning  Lab Results:   [facility] 08/02/11 0620  WBC 9.1  HGB 11.0*  HCT 34.5*  PLT 377   BMET  Basename 08/02/11 0620  NA 135  K 3.6  CL 98  CO2 30  GLUCOSE 220*  BUN 8  CREATININE 0.80  CALCIUM 8.6   CBG (last 3)   Basename 08/02/11 0806 08/01/11 2146 08/01/11 1746  GLUCAP 203* 177* 189*     Studies/Results: No results found.  Anti-infectives: Anti-infectives     Start     Dose/Rate Route Frequency Ordered Stop   07/31/11 2000   vancomycin (VANCOCIN) 1,500 mg in sodium chloride 0.9 % 500 mL IVPB        1,500 mg 250 mL/hr over 120 Minutes Intravenous Every 12 hours 07/31/11 0721     07/29/11 2245   vancomycin (VANCOCIN) 1,500 mg in sodium chloride 0.9 % 500 mL IVPB  Status:  Discontinued        1,500 mg 250 mL/hr over 120 Minutes Intravenous 3 times per day 07/29/11 2235 07/31/11 0720   07/29/11 1600   ertapenem (INVANZ) 1 g in sodium chloride 0.9 % 50 mL IVPB        1 g 100 mL/hr over 30 Minutes Intravenous Every 24 hours 07/28/11 2035     07/28/11 2200   vancomycin (VANCOCIN) IVPB 1000 mg/200 mL premix  Status:  Discontinued        1,000 mg 200 mL/hr over 60 Minutes  Intravenous Every 8 hours 07/28/11 1445 07/29/11 2234   07/28/11 1630   clindamycin (CLEOCIN) IVPB 600 mg        600 mg 100 mL/hr over 30 Minutes Intravenous 3 times per day 07/28/11 1616     07/28/11 1500   vancomycin (VANCOCIN) IVPB 1000 mg/200 mL premix        1,000 mg 200 mL/hr over 60 Minutes Intravenous  Once 07/28/11 1432 07/28/11 1749   07/28/11 1445   ertapenem (INVANZ) 1 g in sodium chloride 0.9 % 50 mL IVPB  Status:  Discontinued        1 g 100 mL/hr over 30 Minutes Intravenous Every 24 hours 07/28/11 1432 07/28/11 2045   07/28/11 1430   gentamicin (GARAMYCIN) IVPB 100 mg  Status:  Discontinued        100 mg 200 mL/hr over 30 Minutes Intravenous  Once 07/28/11 1344 07/28/11 1512   07/28/11 1345   metroNIDAZOLE (FLAGYL) IVPB 500 mg  Status:  Discontinued        500 mg 100 mL/hr over 60 Minutes Intravenous  Once 07/28/11 1344 07/28/11 1512   07/28/11 1200   clindamycin (CLEOCIN) IVPB 600 mg        600 mg 100  mL/hr over 30 Minutes Intravenous  Once 07/28/11 1152 07/28/11 1326          Assessment/Plan: s/p Procedure(s): IRRIGATION AND DEBRIDEMENT PERIRECTAL ABSCESS Wound has been cleaning up well. All healthy granulation per PT hydrotherapy yesterday Blood sugars under better control, medicine following. Likely ready for discharge tomorrow  LOS: 5 days    Elvert Cumpton T 08/02/2011

## 2011-08-03 LAB — GLUCOSE, CAPILLARY
Glucose-Capillary: 192 mg/dL — ABNORMAL HIGH (ref 70–99)
Glucose-Capillary: 218 mg/dL — ABNORMAL HIGH (ref 70–99)

## 2011-08-03 MED ORDER — INSULIN ASPART PROT & ASPART (70-30 MIX) 100 UNIT/ML ~~LOC~~ SUSP
25.0000 [IU] | Freq: Two times a day (BID) | SUBCUTANEOUS | Status: DC
  Filled 2011-08-03: qty 3

## 2011-08-03 MED ORDER — INSULIN ASPART PROT & ASPART (70-30 MIX) 100 UNIT/ML ~~LOC~~ SUSP: 25.0000 [IU] | Freq: Two times a day (BID) | SUBCUTANEOUS | Status: DC

## 2011-08-03 MED ORDER — DOXYCYCLINE HYCLATE 100 MG PO TABS
100.0000 mg | ORAL_TABLET | Freq: Two times a day (BID) | ORAL | Status: DC
  Administered 2011-08-03: 100 mg via ORAL
  Filled 2011-08-03 (×2): qty 1

## 2011-08-03 MED ORDER — DOXYCYCLINE HYCLATE 100 MG PO TABS: 100.0000 mg | ORAL_TABLET | Freq: Two times a day (BID) | ORAL | Status: AC

## 2011-08-03 MED ORDER — ACETAMINOPHEN 325 MG PO TABS: 650.0000 mg | ORAL_TABLET | ORAL | Status: DC | PRN

## 2011-08-03 MED ORDER — OXYCODONE-ACETAMINOPHEN 5-325 MG PO TABS: 1.0000 | ORAL_TABLET | ORAL | Status: AC | PRN

## 2011-08-03 MED ORDER — DSS 100 MG PO CAPS: 100.0000 mg | ORAL_CAPSULE | Freq: Two times a day (BID) | ORAL | Status: AC | PRN

## 2011-08-03 MED ORDER — INSULIN ASPART PROT & ASPART (70-30 MIX) 100 UNIT/ML ~~LOC~~ SUSP: 20.0000 [IU] | Freq: Two times a day (BID) | SUBCUTANEOUS | Status: DC

## 2011-08-03 MED ORDER — INSULIN ASPART 100 UNIT/ML ~~LOC~~ SOLN: 3.0000 [IU] | Freq: Three times a day (TID) | SUBCUTANEOUS | Status: DC

## 2011-08-03 MED ORDER — POLYETHYLENE GLYCOL 3350 17 G PO PACK: 17.0000 g | PACK | Freq: Once | ORAL | Status: AC

## 2011-08-03 MED ORDER — INSULIN ASPART 100 UNIT/ML ~~LOC~~ SOLN: 0.0000 [IU] | Freq: Every day | SUBCUTANEOUS | Status: DC

## 2011-08-03 NOTE — Progress Notes (Signed)
08/03/11 1027  Subjective Assessment  Subjective pt plans to go home today  Evaluation and Treatment  Evaluation and Treatment Procedures Explained to Patient/Family Yes  Evaluation and Treatment Procedures agreed to  Wound 07/29/11 Scrotum s/p surgical I&D of scrotum -- Fournier's Gangrene  Date First Assessed/Time First Assessed: 07/29/11 1135   Location: Scrotum  Wound Description (Comments): s/p surgical I&D of scrotum -- Fournier's Gangrene  Present on Admission: Yes  Site / Wound Assessment Pink;Red  % Wound base Red or Granulating 100%  % Wound base Yellow 0%  % Wound base Other (Comment) 0% (cauterized vessels)  Peri-wound Assessment Edema;Intact  Wound Length (cm) 8 cm  Wound Width (cm) 4 cm  Wound Depth (cm) 3 cm  Margins Unattacted edges (unapproximated)  Closure None  Drainage Amount Minimal  Drainage Description Serous  Treatment Cleansed;Hydrotherapy (Pulse lavage);Packing (Saline gauze)  Dressing Type ABD;Moist to dry;Mesh briefs  Dressing Changed Changed  Dressing Status Clean;Dry;Intact  Hydrotherapy  Pulsed Lavage with Suction (psi) 4 psi  Pulsed Lavage Tip Tip with splash shield  Pulsed lavage therapy - wound location scrotum  Wound Therapy - Assess/Plan/Recommendations  Wound Therapy - Clinical Statement wound appears to be healing.  Pt continues to have significant pain with treatment and dressing change but was able to tolerate with pain pills.  Girlfriend and pt instructed in wound care with hand held shower and dressing change and they are looking forward to assist from [facility] also  Wound Therapy - Functional Problem List Wound location difficult to access  Factors Delaying/Impairing Wound Healing Diabetes Mellitus  Hydrotherapy Plan Debridement;Dressing change;Patient/family education;Pulsatile lavage with suction  Wound Therapy - Frequency 6X / week  Wound Therapy - Follow Up Recommendations Home health RN  Wound Therapy Goals - Improve the function of  patient's integumentary system by progressing the wound(s) through the phases of wound healing by:  Decrease Necrotic Tissue to 10  Decrease Necrotic Tissue - Progress Met  Increase Granulation Tissue to 90  Increase Granulation Tissue - Progress Met  Decrease Length/Width/Depth by (cm) decrease length by 1 cm  Decrease Length/Width/Depth - Progress Progressing toward goal  Improve Drainage Characteristics Other (comment) (none)  Improve Drainage Characteristics - Progress Progressing toward goal  Patient/Family will be able to  verbalize cleansing and dessing change  Patient/Family Instruction Goal - Progress Progressing toward goal

## 2011-08-03 NOTE — Progress Notes (Signed)
6 Days Post-Op  Subjective: He's doing much better. Still really painful to redress, but he's handling somewhat better.  Stools soft, and no real discomfort with BM.  Objective: Vital signs in last 24 hours: Temp:  [98 F (36.7 C)-99.6 F (37.6 C)] 98 F (36.7 C) (04/08 0551) Pulse Rate:  [54-65] 54  (04/08 0551) Resp:  [18] 18  (04/08 0551) BP: (123-171)/(72-94) 123/72 mmHg (04/08 0551) SpO2:  [99 %-100 %] 99 % (04/08 0551) Last BM Date: 08/02/11 Afebrile, VSS, BP is kind of borderline, but may be due to discomfort. Intake/Output from previous day: 04/07 0701 - 04/08 0700 In: 2369.2 [P.O.:720; I.V.:949.2; IV Piggyback:700] Out: 1335 [Urine:1335] Intake/Output this shift:    General appearance: alert, cooperative and no distress Skin: Skin color, texture, turgor normal. No rashes or lesions or Open area is clean and pink Dressing is out.  I have ask that PT see if he can get [facility]. early.  Lab Results:   [facility] 08/02/11 0620  WBC 9.1  HGB 11.0*  HCT 34.5*  PLT 377    BMET  Basename 08/02/11 0620  NA 135  K 3.6  CL 98  CO2 30  GLUCOSE 220*  BUN 8  CREATININE 0.80  CALCIUM 8.6   PT/INR No results found for this basename: LABPROT:2,INR:2 in the last 72 hours   Lab 07/28/11 1420  AST 12  ALT 13  ALKPHOS 95  BILITOT 0.6  PROT 7.4  ALBUMIN 3.2*     Lipase  No results found for this basename: lipase     Studies/Results: No results found.  Medications:    . clindamycin (CLEOCIN) IV  600 mg Intravenous Q8H  . docusate sodium  100 mg Oral BID  . ertapenem (INVANZ) IV  1 g Intravenous Q24H  . feeding supplement  237 mL Oral BID BM  . heparin  5,000 Units Subcutaneous Q8H  . insulin aspart  0-15 Units Subcutaneous TID WC  . insulin aspart  0-5 Units Subcutaneous QHS  . insulin aspart  3 Units Subcutaneous TID WC  . insulin aspart protamine-insulin aspart  20 Units Subcutaneous BID WC  . pantoprazole  40 mg Oral Q1200  . polyethylene glycol  17 g  Oral Once  . vancomycin  1,500 mg Intravenous Q12H  . DISCONTD: insulin aspart protamine-insulin aspart  18 Units Subcutaneous BID WC    Assessment/Plan Fournier's gangrene of the perineum, s/p Debridement of skin, subcutaneous tissue, and muscle of the perineum, 8 cm x 24 cm dimension 07/28/11 Dr. Derrell Lolling.  POD 6 AODM uncontrolled HbA1C 11.3  Hypertension  polysubstance abuse; drug screen + MJ  MRSA +  Plan:  Saline lock IV, He grew out Staph Aureus, I will check on switching to Doxycycline for home use.  Medicine needs to set up his home Medicines, and help him find follow up for his diabetes.  I will ask cas Manager to set up home health to help with Wound care. We can send him home when all the arrangements and decisions are made.  LOS: 6 days    Delois Tolbert 08/03/2011

## 2011-08-03 NOTE — Progress Notes (Addendum)
Inpatient Diabetes Program Recommendations  AACE/ADA: New Consensus Statement on Inpatient Glycemic Control (2009)  Target Ranges:  Prepandial:   less than 140 mg/dL      Peak postprandial:   less than 180 mg/dL (1-2 hours)      Critically ill patients:  140 - 180 mg/dL   Reason for Visit: Hyperglycemia  Results for Frank Schneider, Frank Schneider (MRN [id number]) as of 08/03/2011 12:37  Ref. Range 08/02/2011 08:06 08/02/2011 11:50 08/02/2011 16:46 08/02/2011 21:33 08/03/2011 08:00  Glucose-Capillary Latest Range: 70-99 mg/dL 409 (H) 811 (H) 914 (H) 210 (H) 192 (H)     Inpatient Diabetes Program Recommendations Insulin - Basal: Continue titrating 70/30 insulin Correction (SSI): Currently on Sensitive SSI. Insulin - Meal Coverage: Meal coverage insulin should not be used with mixed insulins per [place] policy  Note: Will continue to follow.  Thank you.   Pt states he feels comfortable giving himself insulin at home.  Discussed importance of controlling blood sugars at home and keeping blood sugar log to take to MD appt.  Also reviewed diet, exercise, hypoglycemia, monitoring and medications.  Has MD appt on Friday at [facility].  To be discharged on 70/30 25 units bid and metformin 1000 mg bid.  Answered questions and pt voices understanding.  Has viewed diabetes videos on pt ed channel. Discussed with PA and RN.

## 2011-08-03 NOTE — Progress Notes (Signed)
CARE MANAGEMENT NOTE 08/03/2011  Patient:  Frank Schneider, Frank Schneider   Account Number:  [id number]  Date Initiated:  08/03/2011  Documentation initiated by:  Senita Corredor  Subjective/Objective Assessment:   36 yo male admitted with abscess     Action/Plan:   D/C when medically stable   Anticipated DC Date:  08/03/2011   Anticipated DC Plan:  HOME W HOME HEALTH SERVICES      DC Planning Services  CM consult      [facility] Choice  HOME HEALTH   Choice offered to / List presented to:  C-1 Patient        HH arranged  HH-1 RN      [facility] agency  [facility]   Status of service:  Completed, signed off  Discharge Disposition:  HOME W HOME HEALTH SERVICES  Comments:  08/03/11, Kathi Der RNC-MNN, BSN, [phone], CM received referral.  CM met with pt and offered choice for [facility] services.  Pt has chosen [facility].  Pt states he will have assistance at discharge.  Darl Pikes at [facility] contacted with order and confirmation of services received.  HOME HEALTH AGENCIES SERVING [place]   Agencies that are Medicare-Certified and are affiliated with [facility] Agency  Telephone Number Address  [facility]   [facility] has ownership interest in this company; however, you are under no obligation to use this agency. [phone] or  [phone] [address] [place], [place] [address]   Agencies that are Medicare-Certified and are not affiliated with [facility] Agency Telephone Number Address  [facility] [phone] Fax [phone] [address] [place], [place]  [address]  [facility] [phone] or [phone] Fax [phone] [address] [place], [place] [address]  [facility] [phone] Fax [phone] [address]  [place], [place] [address]  [facility] [phone] Fax [phone] [address] [place], [place]  [address]  [facility] [phone] Fax [phone] [address] [place], [place] [address]  [facility] [phone] [address] [place], [place] [address]  [facility] [phone]  [address] [place], [place] [address]  [facility] [phone] or [phone] Fax [phone] [address] [place], [place]  [address]  [facility] [phone] Fax [phone] [address] [place], [place]  [address]  [facility]  [phone] Fax [phone] [address] [place], [place] [address]               Agencies that are not Medicare-Certified and are not affiliated with [facility] Agency Telephone Number Address  [facility] [place] [phone] or [phone] Fax [phone] [address] [place]  [address]  [facility] [phone] Fax [phone] [address] [place], [place]  [address]  [facility]  [phone] Fax [phone] [address] [place], [place] [address]  [facility] [phone] Fax [phone] [address] [place], [place] [address]  [facility] [phone] or [phone] Fax [phone] [address] [place], [place]  [address]  [facility] [phone] or [phone] Fax [phone] [address] [place], [place]  [address]  [facility] [phone] Fax [phone] [address] [place], [place]  [address]  [facility] [phone] [address] [place], [place]  [address]  [facility] [phone] Fax [phone] [address] [place], [place]  [address]  [facility] [phone] Fax [phone] [address] [place], [place]  [address]  [facility] [phone] Fax [phone] [address] [place], [place] [address]  [facility] [phone] Fax [phone] [address] [place], [place]  [address]

## 2011-08-03 NOTE — Discharge Summary (Addendum)
Physician Discharge Summary  Patient ID: Frank Schneider MRN: [id number] DOB/AGE: 1975-07-29 36 y.o.  Admit date: 07/28/2011 Discharge date: 08/03/2011  Admission Diagnoses:1.Perirectal abscess/possible early Fournier's gangrene  2.Uncontrolled diabetes 5 years or more  3.Hypertension  4.ETOH use  5. Tobacco use, ETOH, and drug use   Discharge Diagnoses: Fournier's gangrene of the perineum  2.Uncontrolled diabetes 5 years or more  3.Hypertension  4.ETOH use  5. Tobacco use, ETOH, and drug use    Active Problems:  * No active hospital problems. *    PROCEDURES: Debridement of skin, subcutaneous tissue, and muscle of the perineum, 8 cm x 24 cm dimension 07/28/11   Hospital Course:  This is a 36 year old African American male with uncontrolled diabetes. He reports a small bump on his right perirectal area which started on Friday, 07/24/2010 which she's been treating with warm soaks. He was seen in the emergency room on March 31 and was sent home. The area in his right perirectal area became progressively worse and he presented to the emergency room at [facility] today. Exam showed a complex abscess with blistering and foul odor on the right side. CT scan showed extensive edema with fluid and gas and this area of the perineum in the right anterior position. WBC 24,000. Glucose 427. We admitted the patient, start him on antibiotics, and asked the medical service to control his diabetes, and he is brought to the operating room urgently.Marland Kitchen  He underwent extensive debridement of the affected tissue.  We started local wound care, with hydrotherapy, and dressing changes.  He was maintained on antibiotics.  Culture grew Staph Aureus.   He is tolerating his dressing changes fairly well.  Diabetes has been controlled with Insulin by the Medicine service.  He is currently on 70/30 Novalog 20units BID and sliding scale.  We are going to change this to 25 units BID, and discontinue the sliding scale.  We have  spent a good deal of time talking to him about diabetes, and how to take care of it.  He is going to see [facility] @[address] on Friday to follow up on his diabetes.  I have also talked to his girlfriend and told her how to handle hypoglycemia episodes at home. Once diabetic teaching and needs are filled we plan to d/c home.  Follow up:  Dr. Derrell Lolling 2 weeks [facility] : 4 days  Condition on D/C:  improvong  Disposition: 01-Home or Self Care CBC    Component Value Date/Time   WBC 9.1 08/02/2011 0620   RBC 5.63 08/02/2011 0620   HGB 11.0* 08/02/2011 0620   HCT 34.5* 08/02/2011 0620   PLT 377 08/02/2011 0620   MCV 61.3* 08/02/2011 0620   MCH 19.5* 08/02/2011 0620   MCHC 31.9 08/02/2011 0620   RDW 15.3 08/02/2011 0620   LYMPHSABS 2.2 07/28/2011 1205   MONOABS 1.7* 07/28/2011 1205   EOSABS 0.0 07/28/2011 1205   BASOSABS 0.0 07/28/2011 1205   Hemaglobin A1C  11.3 on admission Drug Screen: + MJ CMP     Component Value Date/Time   NA 135 08/02/2011 0620   K 3.6 08/02/2011 0620   CL 98 08/02/2011 0620   CO2 30 08/02/2011 0620   GLUCOSE 220* 08/02/2011 0620   BUN 8 08/02/2011 0620   CREATININE 0.80 08/02/2011 0620   CALCIUM 8.6 08/02/2011 0620   PROT 7.4 07/28/2011 1420   ALBUMIN 3.2* 07/28/2011 1420   AST 12 07/28/2011 1420   ALT 13 07/28/2011 1420  ALKPHOS 95 07/28/2011 1420   BILITOT 0.6 07/28/2011 1420   GFRNONAA >90 08/02/2011 0620   GFRAA >90 08/02/2011 0620   .hba    Medication List  As of 08/03/2011  2:12 PM   TAKE these medications         acetaminophen 325 MG tablet   Commonly known as: TYLENOL   Take 2 tablets (650 mg total) by mouth every 4 (four) hours as needed.      doxycycline 100 MG tablet   Commonly known as: VIBRA-TABS   Take 1 tablet (100 mg total) by mouth every 12 (twelve) hours.      DSS 100 MG Caps   Take 100 mg by mouth 2 (two) times daily as needed for constipation.      hydrochlorothiazide 25 MG tablet   Commonly known as: HYDRODIURIL   Take 25 mg by mouth daily.      insulin aspart  protamine-insulin aspart (70-30) 100 UNIT/ML injection   Commonly known as: NOVOLOG 70/30   Inject 25 Units into the skin 2 (two) times daily with a meal.      metFORMIN 1000 MG tablet   Commonly known as: GLUCOPHAGE   Take 1 tablet (1,000 mg total) by mouth 2 (two) times daily with a meal.      oxyCODONE-acetaminophen 5-325 MG per tablet   Commonly known as: PERCOCET   Take 1-2 tablets by mouth every 4 (four) hours as needed.      polyethylene glycol packet   Commonly known as: MIRALAX / GLYCOLAX   Take 17 g by mouth once.           Follow-up Information    Follow up with Ernestene Mention, MD. Call in 1 week.   Contact information:   [employer] [facility] [address] [place] [address] [phone]       Follow [facility] with [facility] [address] Schedule an appointment as soon as possible for a visit in 4 days. (Monitor your glucose, keep a record of it and when you eat and take Insulin. Call if your sugar is over 300  or less than 70 as needed)          Signed: Wilferd Ritson 08/03/2011, 2:12 PM

## 2011-08-03 NOTE — Progress Notes (Signed)
paitent's Iv site is leaking, iv removed with cath intact, drsg applied, pt does not wish to have another IV restarted because he says that he is possibly going home this afternoon and will wait and see Means, Neetu Carrozza N 08-03-11 10:51am

## 2011-08-03 NOTE — Progress Notes (Signed)
He says he is ready to go home. Await arrangements for discharge

## 2011-08-06 ENCOUNTER — Encounter (HOSPITAL_COMMUNITY): Payer: Self-pay | Admitting: General Surgery

## 2012-01-06 ENCOUNTER — Encounter (HOSPITAL_COMMUNITY): Payer: Self-pay | Admitting: Emergency Medicine

## 2012-01-06 ENCOUNTER — Emergency Department (HOSPITAL_COMMUNITY)
Admission: EM | Admit: 2012-01-06 | Discharge: 2012-01-06 | Disposition: A | Discharge status: Home / Self Care | Payer: Self-pay | Attending: Emergency Medicine | Admitting: Emergency Medicine

## 2012-01-06 DIAGNOSIS — J40 Bronchitis, not specified as acute or chronic: Secondary | ICD-10-CM | POA: Insufficient documentation

## 2012-01-06 DIAGNOSIS — Z794 Long term (current) use of insulin: Secondary | ICD-10-CM | POA: Insufficient documentation

## 2012-01-06 DIAGNOSIS — F17200 Nicotine dependence, unspecified, uncomplicated: Secondary | ICD-10-CM | POA: Insufficient documentation

## 2012-01-06 DIAGNOSIS — Z91199 Patient's noncompliance with other medical treatment and regimen due to unspecified reason: Secondary | ICD-10-CM | POA: Insufficient documentation

## 2012-01-06 DIAGNOSIS — E119 Type 2 diabetes mellitus without complications: Secondary | ICD-10-CM | POA: Insufficient documentation

## 2012-01-06 DIAGNOSIS — I10 Essential (primary) hypertension: Secondary | ICD-10-CM | POA: Insufficient documentation

## 2012-01-06 DIAGNOSIS — R739 Hyperglycemia, unspecified: Secondary | ICD-10-CM

## 2012-01-06 DIAGNOSIS — Z9119 Patient's noncompliance with other medical treatment and regimen: Secondary | ICD-10-CM | POA: Insufficient documentation

## 2012-01-06 LAB — POCT I-STAT, CHEM 8
BUN: 7 mg/dL (ref 6–23)
Calcium, Ion: 1.19 mmol/L (ref 1.12–1.23)
Chloride: 98 mEq/L (ref 96–112)
HCT: 44 % (ref 39.0–52.0)
Potassium: 4.6 mEq/L (ref 3.5–5.1)

## 2012-01-06 LAB — RAPID STREP SCREEN (MED CTR MEBANE ONLY): Streptococcus, Group A Screen (Direct): NEGATIVE

## 2012-01-06 LAB — GLUCOSE, CAPILLARY: Glucose-Capillary: 281 mg/dL — ABNORMAL HIGH (ref 70–99)

## 2012-01-06 MED ORDER — HYDROCHLOROTHIAZIDE 25 MG PO TABS: 25.0000 mg | ORAL_TABLET | Freq: Every day | ORAL | Status: DC

## 2012-01-06 MED ORDER — AZITHROMYCIN 250 MG PO TABS: ORAL_TABLET | ORAL | Status: AC

## 2012-01-06 MED ORDER — METFORMIN HCL 1000 MG PO TABS: 1000.0000 mg | ORAL_TABLET | Freq: Two times a day (BID) | ORAL | Status: DC

## 2012-01-06 NOTE — ED Provider Notes (Signed)
History    This chart was scribed for Frank Melter, MD, MD by Smitty Pluck. The patient was seen in room TR07C and the patient's care was started at 4:11PM.   CSN: [id number]  Arrival date & time 01/06/12  1353      Chief Complaint  Patient presents with  . Sore Throat  . Medication Refill    (Consider location/radiation/quality/duration/timing/severity/associated sxs/prior treatment) Patient is a 36 y.o. male presenting with pharyngitis. The history is provided by the patient. No language interpreter was used.  Sore Throat Pertinent negatives include no shortness of breath.   Frank Schneider is a 36 y.o. male who presents to the Emergency Department complaining of constant, moderate sore throat and productive cough with yellow sputum onset 1 week ago. Pt reports that he has ran out medication (metformin and hydrochlorothiazide) 2 days ago and needs a refill. Pt has hx of diabetes mellitus. Denies any other pain currently.   Past Medical History  Diagnosis Date  . Diabetes mellitus   . Hypertension   . Priapism   . Tobacco use     Past Surgical History  Procedure Date  . Penectomy   . Incision and drainage perirectal abscess 07/28/2011    Procedure: IRRIGATION AND DEBRIDEMENT PERIRECTAL ABSCESS;  Surgeon: Ernestene Mention, MD;  Location: [facility];  Service: General;  Laterality: N/A;   of skin muscle and subcutaneous tissue of perimeum  8cmx12cm area     No family history on file.  History  Substance Use Topics  . Smoking status: Current Some Day Smoker -- 0.5 packs/day for 8 years    Types: Cigarettes  . Smokeless tobacco: Never Used  . Alcohol Use: Yes     ocassionally      Review of Systems  Constitutional: Negative for fever and chills.  Respiratory: Negative for shortness of breath.   Gastrointestinal: Negative for nausea and vomiting.  Neurological: Negative for weakness.  All other systems reviewed and are negative.    Allergies  Review of patient's  allergies indicates no known allergies.  Home Medications   Current Outpatient Rx  Name Route Sig Dispense Refill  . INSULIN ASPART PROT & ASPART (70-30) 100 UNIT/ML [place] SUSP Subcutaneous Inject 25 Units into the skin 2 (two) times daily with a meal. 10 mL 0  . AZITHROMYCIN 250 MG PO TABS  Take 2 today, then 1 each day for 4 days 6 each 0  . HYDROCHLOROTHIAZIDE 25 MG PO TABS Oral Take 1 tablet (25 mg total) by mouth daily. 30 tablet 0  . METFORMIN HCL 1000 MG PO TABS Oral Take 1 tablet (1,000 mg total) by mouth 2 (two) times daily with a meal. 60 tablet 0    BP 129/68  Pulse 75  Temp 99.5 F (37.5 C) (Oral)  Resp 20  SpO2 100%  Physical Exam  Nursing note and vitals reviewed. Constitutional: He is oriented to person, place, and time. He appears well-developed and well-nourished. No distress.  HENT:  Head: Normocephalic and atraumatic.  Mouth/Throat: Oropharynx is clear and moist.  Eyes: EOM are normal.  Neck: Neck supple. No tracheal deviation present.  Cardiovascular: Normal rate, regular rhythm and normal heart sounds.   Pulmonary/Chest: Effort normal and breath sounds normal. No respiratory distress. He has no wheezes. He has no rales.  Musculoskeletal: Normal range of motion.  Neurological: He is alert and oriented to person, place, and time.  Skin: Skin is warm and dry.  Psychiatric: He has a normal mood and  affect. His behavior is normal.    ED Course  Procedures (including critical care time) DIAGNOSTIC STUDIES: Oxygen Saturation is 97% on room air, normal by my interpretation.    COORDINATION OF CARE: 4:14 PM Discussed pt ED treatment with pt     Labs Reviewed  GLUCOSE, CAPILLARY - Abnormal; Notable for the following:    Glucose-Capillary 281 (*)     All other components within normal limits  POCT I-STAT, CHEM 8 - Abnormal; Notable for the following:    Glucose, Bld 260 (*)     All other components within normal limits  RAPID STREP SCREEN   No results  found.   1. Bronchitis   2. Hyperglycemia   3. Hypertension       MDM  Medical noncompliance leading to hyperglycemia. He is not ketotic. Doubt metabolic instability, serious bacterial infection or impending vascular collapse; the patient is stable for discharge.  I personally performed the services described in this documentation, which was scribed in my presence. The recorded information has been reviewed and considered.    Plan: Home Medications- usual medication RXed and Zithromax; Home Treatments- Push fluids; Recommended follow up- PCP of choice 1-2 weeks.       Frank Melter, MD 01/06/12 1723

## 2012-01-06 NOTE — ED Notes (Signed)
Pt stated that he started having a sore throat a week ago, c/o throat tightness, productive cough with green sputum, intermittent chills and sweating, and h/a. Pt has not taken DM medication in the past 2 days d/t out of medication and needs a refill.

## 2012-05-05 ENCOUNTER — Emergency Department (HOSPITAL_COMMUNITY)
Admission: EM | Admit: 2012-05-05 | Discharge: 2012-05-05 | Disposition: A | Discharge status: Home / Self Care | Payer: Self-pay | Attending: Emergency Medicine | Admitting: Emergency Medicine

## 2012-05-05 ENCOUNTER — Encounter (HOSPITAL_COMMUNITY): Payer: Self-pay | Admitting: Cardiology

## 2012-05-05 DIAGNOSIS — Z79899 Other long term (current) drug therapy: Secondary | ICD-10-CM | POA: Insufficient documentation

## 2012-05-05 DIAGNOSIS — R3589 Other polyuria: Secondary | ICD-10-CM | POA: Insufficient documentation

## 2012-05-05 DIAGNOSIS — F17200 Nicotine dependence, unspecified, uncomplicated: Secondary | ICD-10-CM | POA: Insufficient documentation

## 2012-05-05 DIAGNOSIS — H5440 Blindness, one eye, unspecified eye: Secondary | ICD-10-CM | POA: Insufficient documentation

## 2012-05-05 DIAGNOSIS — Z794 Long term (current) use of insulin: Secondary | ICD-10-CM | POA: Insufficient documentation

## 2012-05-05 DIAGNOSIS — Z87448 Personal history of other diseases of urinary system: Secondary | ICD-10-CM | POA: Insufficient documentation

## 2012-05-05 DIAGNOSIS — H547 Unspecified visual loss: Secondary | ICD-10-CM

## 2012-05-05 DIAGNOSIS — R358 Other polyuria: Secondary | ICD-10-CM | POA: Insufficient documentation

## 2012-05-05 DIAGNOSIS — E1169 Type 2 diabetes mellitus with other specified complication: Secondary | ICD-10-CM | POA: Insufficient documentation

## 2012-05-05 DIAGNOSIS — R739 Hyperglycemia, unspecified: Secondary | ICD-10-CM

## 2012-05-05 DIAGNOSIS — I10 Essential (primary) hypertension: Secondary | ICD-10-CM | POA: Insufficient documentation

## 2012-05-05 DIAGNOSIS — R631 Polydipsia: Secondary | ICD-10-CM | POA: Insufficient documentation

## 2012-05-05 LAB — BASIC METABOLIC PANEL
CO2: 29 mEq/L (ref 19–32)
Calcium: 9.4 mg/dL (ref 8.4–10.5)
Chloride: 100 mEq/L (ref 96–112)
Glucose, Bld: 194 mg/dL — ABNORMAL HIGH (ref 70–99)
Potassium: 4.3 mEq/L (ref 3.5–5.1)
Sodium: 138 mEq/L (ref 135–145)

## 2012-05-05 LAB — CBC WITH DIFFERENTIAL/PLATELET
Basophils Absolute: 0 10*3/uL (ref 0.0–0.1)
Basophils Relative: 0 % (ref 0–1)
Eosinophils Relative: 3 % (ref 0–5)
HCT: 37 % — ABNORMAL LOW (ref 39.0–52.0)
Hemoglobin: 12.4 g/dL — ABNORMAL LOW (ref 13.0–17.0)
Lymphocytes Relative: 25 % (ref 12–46)
Lymphs Abs: 2.7 10*3/uL (ref 0.7–4.0)
MCV: 61.5 fL — ABNORMAL LOW (ref 78.0–100.0)
Monocytes Relative: 5 % (ref 3–12)
Neutro Abs: 7.4 10*3/uL (ref 1.7–7.7)
RDW: 15.9 % — ABNORMAL HIGH (ref 11.5–15.5)
WBC: 10.9 10*3/uL — ABNORMAL HIGH (ref 4.0–10.5)

## 2012-05-05 LAB — GLUCOSE, CAPILLARY: Glucose-Capillary: 196 mg/dL — ABNORMAL HIGH (ref 70–99)

## 2012-05-05 MED ORDER — SODIUM CHLORIDE 0.9 % IV BOLUS (SEPSIS)
1000.0000 mL | Freq: Once | INTRAVENOUS | Status: AC
  Administered 2012-05-05: 1000 mL via INTRAVENOUS

## 2012-05-05 MED ORDER — METFORMIN HCL 1000 MG PO TABS: 1000.0000 mg | ORAL_TABLET | Freq: Two times a day (BID) | ORAL | Status: DC

## 2012-05-05 NOTE — ED Notes (Signed)
Pt reports he is out of his metformin and need a refill. 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 States he is unable to see anything out of his right eye. Reports prior to this he was having migraines.
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
## 2012-05-05 NOTE — ED Notes (Signed)
MD at bedside. 

## 2012-05-05 NOTE — ED Notes (Signed)
Notified RN of CBG 196

## 2012-05-05 NOTE — ED Notes (Signed)
Pt. Is 20/50 in left eye, unable to see in right
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
## 2012-08-07 ENCOUNTER — Encounter (HOSPITAL_COMMUNITY): Payer: Self-pay | Admitting: Family Medicine

## 2012-08-07 ENCOUNTER — Emergency Department (HOSPITAL_COMMUNITY)
Admission: EM | Admit: 2012-08-07 | Discharge: 2012-08-07 | Disposition: A | Discharge status: Home / Self Care | Payer: Self-pay | Attending: Emergency Medicine | Admitting: Emergency Medicine

## 2012-08-07 ENCOUNTER — Emergency Department (HOSPITAL_COMMUNITY): Payer: Self-pay

## 2012-08-07 DIAGNOSIS — R110 Nausea: Secondary | ICD-10-CM | POA: Insufficient documentation

## 2012-08-07 DIAGNOSIS — Z87448 Personal history of other diseases of urinary system: Secondary | ICD-10-CM | POA: Insufficient documentation

## 2012-08-07 DIAGNOSIS — R51 Headache: Secondary | ICD-10-CM | POA: Insufficient documentation

## 2012-08-07 DIAGNOSIS — R630 Anorexia: Secondary | ICD-10-CM | POA: Insufficient documentation

## 2012-08-07 DIAGNOSIS — I10 Essential (primary) hypertension: Secondary | ICD-10-CM | POA: Insufficient documentation

## 2012-08-07 DIAGNOSIS — E119 Type 2 diabetes mellitus without complications: Secondary | ICD-10-CM | POA: Insufficient documentation

## 2012-08-07 DIAGNOSIS — F17200 Nicotine dependence, unspecified, uncomplicated: Secondary | ICD-10-CM | POA: Insufficient documentation

## 2012-08-07 DIAGNOSIS — Z794 Long term (current) use of insulin: Secondary | ICD-10-CM | POA: Insufficient documentation

## 2012-08-07 DIAGNOSIS — Z79899 Other long term (current) drug therapy: Secondary | ICD-10-CM | POA: Insufficient documentation

## 2012-08-07 LAB — POCT I-STAT, CHEM 8
BUN: 10 mg/dL (ref 6–23)
Calcium, Ion: 1.22 mmol/L (ref 1.12–1.23)
Chloride: 102 mEq/L (ref 96–112)
HCT: 43 % (ref 39.0–52.0)
Potassium: 4.6 mEq/L (ref 3.5–5.1)
Sodium: 139 mEq/L (ref 135–145)

## 2012-08-07 MED ORDER — METOCLOPRAMIDE HCL 5 MG/ML IJ SOLN
10.0000 mg | Freq: Once | INTRAMUSCULAR | Status: AC
  Administered 2012-08-07: 10 mg via INTRAVENOUS
  Filled 2012-08-07: qty 2

## 2012-08-07 MED ORDER — KETOROLAC TROMETHAMINE 30 MG/ML IJ SOLN
30.0000 mg | Freq: Once | INTRAMUSCULAR | Status: AC
  Administered 2012-08-07: 30 mg via INTRAVENOUS
  Filled 2012-08-07: qty 1

## 2012-08-07 MED ORDER — SODIUM CHLORIDE 0.9 % IV BOLUS (SEPSIS)
1000.0000 mL | Freq: Once | INTRAVENOUS | Status: AC
  Administered 2012-08-07: 1000 mL via INTRAVENOUS

## 2012-08-07 MED ORDER — DIPHENHYDRAMINE HCL 50 MG/ML IJ SOLN
25.0000 mg | Freq: Once | INTRAMUSCULAR | Status: AC
  Administered 2012-08-07: 25 mg via INTRAVENOUS
  Filled 2012-08-07: qty 1

## 2012-08-07 NOTE — ED Notes (Signed)
Per pt sts 2 days of HA. sts no hx of the same. sts dizzy and lightheaded from the pain.
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
## 2012-08-09 NOTE — ED Provider Notes (Signed)
Medical screening examination/treatment/procedure(s) were performed by non-physician practitioner and as supervising physician I was immediately available for consultation/collaboration.    Shelda Jakes, MD 08/09/12 1314

## 2013-02-24 ENCOUNTER — Emergency Department (HOSPITAL_COMMUNITY)
Admission: EM | Admit: 2013-02-24 | Discharge: 2013-02-24 | Discharge status: Against Medical Advice | Payer: Self-pay | Attending: Emergency Medicine | Admitting: Emergency Medicine

## 2013-02-24 ENCOUNTER — Encounter (HOSPITAL_COMMUNITY): Payer: Self-pay | Admitting: Emergency Medicine

## 2013-02-24 ENCOUNTER — Ambulatory Visit (HOSPITAL_COMMUNITY): Admission: RE | Admit: 2013-02-24 | Payer: Self-pay | Source: Ambulatory Visit

## 2013-02-24 DIAGNOSIS — F17200 Nicotine dependence, unspecified, uncomplicated: Secondary | ICD-10-CM | POA: Insufficient documentation

## 2013-02-24 DIAGNOSIS — G43909 Migraine, unspecified, not intractable, without status migrainosus: Secondary | ICD-10-CM | POA: Insufficient documentation

## 2013-02-24 DIAGNOSIS — I10 Essential (primary) hypertension: Secondary | ICD-10-CM | POA: Insufficient documentation

## 2013-02-24 DIAGNOSIS — E119 Type 2 diabetes mellitus without complications: Secondary | ICD-10-CM | POA: Insufficient documentation

## 2013-02-24 DIAGNOSIS — Z79899 Other long term (current) drug therapy: Secondary | ICD-10-CM | POA: Insufficient documentation

## 2013-02-24 DIAGNOSIS — Z87448 Personal history of other diseases of urinary system: Secondary | ICD-10-CM | POA: Insufficient documentation

## 2013-02-24 LAB — CBC WITH DIFFERENTIAL/PLATELET
Eosinophils Absolute: 0.3 10*3/uL (ref 0.0–0.7)
Eosinophils Relative: 3 % (ref 0–5)
HCT: 36.8 % — ABNORMAL LOW (ref 39.0–52.0)
Lymphs Abs: 2.4 10*3/uL (ref 0.7–4.0)
MCH: 20.5 pg — ABNORMAL LOW (ref 26.0–34.0)
MCHC: 33.7 g/dL (ref 30.0–36.0)
MCV: 60.9 fL — ABNORMAL LOW (ref 78.0–100.0)
Monocytes Absolute: 0.7 10*3/uL (ref 0.1–1.0)
Neutro Abs: 7.5 10*3/uL (ref 1.7–7.7)
Platelets: 173 10*3/uL (ref 150–400)
RBC: 6.04 MIL/uL — ABNORMAL HIGH (ref 4.22–5.81)
RDW: 16.1 % — ABNORMAL HIGH (ref 11.5–15.5)

## 2013-02-24 LAB — COMPREHENSIVE METABOLIC PANEL
AST: 18 U/L (ref 0–37)
Albumin: 4.1 g/dL (ref 3.5–5.2)
BUN: 12 mg/dL (ref 6–23)
CO2: 25 mEq/L (ref 19–32)
Calcium: 9.1 mg/dL (ref 8.4–10.5)
Chloride: 100 mEq/L (ref 96–112)
Creatinine, Ser: 0.82 mg/dL (ref 0.50–1.35)
GFR calc non Af Amer: >90 mL/min (ref >90)
Total Bilirubin: 0.5 mg/dL (ref 0.3–1.2)

## 2013-02-24 LAB — GLUCOSE, CAPILLARY: Glucose-Capillary: 220 mg/dL — ABNORMAL HIGH (ref 70–99)

## 2013-02-24 MED ORDER — KETOROLAC TROMETHAMINE 30 MG/ML IJ SOLN
30.0000 mg | Freq: Once | INTRAMUSCULAR | Status: AC
  Administered 2013-02-24: 30 mg via INTRAVENOUS
  Filled 2013-02-24: qty 1

## 2013-02-24 MED ORDER — METOCLOPRAMIDE HCL 5 MG/ML IJ SOLN
10.0000 mg | Freq: Once | INTRAMUSCULAR | Status: AC
  Administered 2013-02-24: 10 mg via INTRAVENOUS
  Filled 2013-02-24: qty 2

## 2013-02-24 MED ORDER — SODIUM CHLORIDE 0.9 % IV BOLUS (SEPSIS)
1000.0000 mL | Freq: Once | INTRAVENOUS | Status: AC
  Administered 2013-02-24: 1000 mL via INTRAVENOUS

## 2013-02-24 MED ORDER — DIPHENHYDRAMINE HCL 50 MG/ML IJ SOLN
25.0000 mg | Freq: Once | INTRAMUSCULAR | Status: AC
  Administered 2013-02-24: 25 mg via INTRAVENOUS
  Filled 2013-02-24: qty 1

## 2013-02-24 NOTE — ED Notes (Signed)
Pt states he does not want to be here, says we are doing the same thing as last time, says he can take care of it himself. [facility], Pt requests IV removed and we are not fixing him. PA informed.

## 2013-02-24 NOTE — ED Notes (Signed)
Pt was asked how he was getting home, says a cousin is coming to get him. PA and nurse asked pt several times what can we do for him, Pt just refused to answer., repeating he just needs to go.Pt was told to follow-up with a Neurologist and eye doctor. Pa and Nurse repeated that he can leave, but return if needed at anytime. Pt left AMA

## 2013-02-24 NOTE — ED Notes (Signed)
State he is having a migraine headache today. States his BP is high also but he does not have a doctor to give him BP pills. Also c/o nausea.
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
## 2013-02-24 NOTE — ED Notes (Signed)
PA at bedside.

## 2013-02-26 NOTE — ED Provider Notes (Signed)
Medical screening examination/treatment/procedure(s) were performed by non-physician practitioner and as supervising physician I was immediately available for consultation/collaboration.  EKG Interpretation   None         Shelda Jakes, MD 02/26/13 [phone]

## 2013-11-09 ENCOUNTER — Emergency Department (HOSPITAL_COMMUNITY)
Admission: EM | Admit: 2013-11-09 | Discharge: 2013-11-09 | Disposition: A | Discharge status: Home / Self Care | Payer: Self-pay | Attending: Emergency Medicine | Admitting: Emergency Medicine

## 2013-11-09 ENCOUNTER — Encounter (HOSPITAL_COMMUNITY): Payer: Self-pay | Admitting: Emergency Medicine

## 2013-11-09 ENCOUNTER — Emergency Department (HOSPITAL_COMMUNITY): Payer: Self-pay

## 2013-11-09 DIAGNOSIS — R12 Heartburn: Secondary | ICD-10-CM | POA: Insufficient documentation

## 2013-11-09 DIAGNOSIS — Z79899 Other long term (current) drug therapy: Secondary | ICD-10-CM | POA: Insufficient documentation

## 2013-11-09 DIAGNOSIS — R1031 Right lower quadrant pain: Secondary | ICD-10-CM | POA: Insufficient documentation

## 2013-11-09 DIAGNOSIS — G43009 Migraine without aura, not intractable, without status migrainosus: Secondary | ICD-10-CM | POA: Insufficient documentation

## 2013-11-09 DIAGNOSIS — F17200 Nicotine dependence, unspecified, uncomplicated: Secondary | ICD-10-CM | POA: Insufficient documentation

## 2013-11-09 DIAGNOSIS — I10 Essential (primary) hypertension: Secondary | ICD-10-CM | POA: Insufficient documentation

## 2013-11-09 DIAGNOSIS — H269 Unspecified cataract: Secondary | ICD-10-CM | POA: Insufficient documentation

## 2013-11-09 DIAGNOSIS — R1032 Left lower quadrant pain: Secondary | ICD-10-CM | POA: Insufficient documentation

## 2013-11-09 DIAGNOSIS — E119 Type 2 diabetes mellitus without complications: Secondary | ICD-10-CM | POA: Insufficient documentation

## 2013-11-09 DIAGNOSIS — Z87448 Personal history of other diseases of urinary system: Secondary | ICD-10-CM | POA: Insufficient documentation

## 2013-11-09 LAB — CBC
HCT: 40.6 % (ref 39.0–52.0)
Hemoglobin: 13.1 g/dL (ref 13.0–17.0)
MCH: 19.4 pg — AB (ref 26.0–34.0)
MCHC: 32.3 g/dL (ref 30.0–36.0)
MCV: 60.1 fL — AB (ref 78.0–100.0)
PLATELETS: 156 10*3/uL (ref 150–400)
RBC: 6.75 MIL/uL — AB (ref 4.22–5.81)
RDW: 15.5 % (ref 11.5–15.5)
WBC: 12 10*3/uL — AB (ref 4.0–10.5)

## 2013-11-09 LAB — COMPREHENSIVE METABOLIC PANEL
ALT: 16 U/L (ref 0–53)
ANION GAP: 14 (ref 5–15)
AST: 16 U/L (ref 0–37)
Albumin: 4.3 g/dL (ref 3.5–5.2)
Alkaline Phosphatase: 79 U/L (ref 39–117)
BILIRUBIN TOTAL: 0.9 mg/dL (ref 0.3–1.2)
BUN: 6 mg/dL (ref 6–23)
CALCIUM: 10.3 mg/dL (ref 8.4–10.5)
CHLORIDE: 96 meq/L (ref 96–112)
CO2: 26 mEq/L (ref 19–32)
CREATININE: 0.82 mg/dL (ref 0.50–1.35)
GFR calc Af Amer: >90 mL/min (ref >90)
GLUCOSE: 255 mg/dL — AB (ref 70–99)
Potassium: 4.4 mEq/L (ref 3.7–5.3)
Sodium: 136 mEq/L — ABNORMAL LOW (ref 137–147)
Total Protein: 7.3 g/dL (ref 6.0–8.3)

## 2013-11-09 LAB — URINALYSIS, ROUTINE W REFLEX MICROSCOPIC
Bilirubin Urine: NEGATIVE
GLUCOSE, UA: 100 mg/dL — AB
KETONES UR: 15 mg/dL — AB
Leukocytes, UA: NEGATIVE
Nitrite: NEGATIVE
PROTEIN: 30 mg/dL — AB
Specific Gravity, Urine: 1.015 (ref 1.005–1.030)
Urobilinogen, UA: 0.2 mg/dL (ref 0.0–1.0)
pH: 6 (ref 5.0–8.0)

## 2013-11-09 LAB — URINE MICROSCOPIC-ADD ON

## 2013-11-09 LAB — CBG MONITORING, ED: Glucose-Capillary: 260 mg/dL — ABNORMAL HIGH (ref 70–99)

## 2013-11-09 LAB — I-STAT TROPONIN, ED: Troponin i, poc: 0.01 ng/mL (ref 0.00–0.08)

## 2013-11-09 MED ORDER — GI COCKTAIL ~~LOC~~
30.0000 mL | Freq: Once | ORAL | Status: AC
  Administered 2013-11-09: 30 mL via ORAL
  Filled 2013-11-09: qty 30

## 2013-11-09 MED ORDER — KETOROLAC TROMETHAMINE 60 MG/2ML IM SOLN: 60.0000 mg | Freq: Once | INTRAMUSCULAR | Status: DC

## 2013-11-09 MED ORDER — FAMOTIDINE 20 MG PO TABS: 20.0000 mg | ORAL_TABLET | Freq: Two times a day (BID) | ORAL | Status: DC

## 2013-11-09 MED ORDER — KETOROLAC TROMETHAMINE 60 MG/2ML IM SOLN
60.0000 mg | Freq: Once | INTRAMUSCULAR | Status: AC
  Administered 2013-11-09: 60 mg via INTRAMUSCULAR
  Filled 2013-11-09: qty 2

## 2013-11-09 MED ORDER — METOCLOPRAMIDE HCL 10 MG PO TABS
10.0000 mg | ORAL_TABLET | Freq: Once | ORAL | Status: AC
  Administered 2013-11-09: 10 mg via ORAL
  Filled 2013-11-09: qty 1

## 2013-11-09 MED ORDER — METOCLOPRAMIDE HCL 5 MG/ML IJ SOLN: 10.0000 mg | Freq: Once | INTRAMUSCULAR | Status: DC

## 2013-11-09 NOTE — Discharge Instructions (Signed)
Take Pepcid as directed for your heartburn. Followup with one of the resources below for the wellness Center to establish care with a primary care physician.  Heartburn Heartburn is a painful, burning sensation in the chest. It may feel worse in certain positions, such as lying down or bending over. It is caused by stomach acid backing up into the tube that carries food from the mouth down to the stomach (lower esophagus).  CAUSES   Large meals.  Certain foods and drinks.  Exercise.  Increased acid production.  Being overweight or obese.  Certain medicines. SYMPTOMS   Burning pain in the chest or lower throat.  Bitter taste in the mouth.  Coughing. DIAGNOSIS  If the usual treatments for heartburn do not improve your symptoms, then tests may be done to see if there is another condition present. Possible tests may include:  X-rays.  Endoscopy. This is when a tube with a light and a camera on the end is used to examine the esophagus and the stomach.  A test to measure the amount of acid in the esophagus (pH test).  A test to see if the esophagus is working properly (esophageal manometry).  Blood, breath, or stool tests to check for bacteria that cause ulcers. TREATMENT   Your caregiver may tell you to use certain over-the-counter medicines (antacids, acid reducers) for mild heartburn.  Your caregiver may prescribe medicines to decrease the acid in your stomach or protect your stomach lining.  Your caregiver may recommend certain diet changes.  For severe cases, your caregiver may recommend that the head of your bed be elevated on blocks. (Sleeping with more pillows is not an effective treatment as it only changes the position of your head and does not improve the main problem of stomach acid refluxing into the esophagus.) HOME CARE INSTRUCTIONS   Take all medicines as directed by your caregiver.  Raise the head of your bed by putting blocks under the legs if instructed  to by your caregiver.  Do not exercise right after eating.  Avoid eating 2 or 3 hours before bed. Do not lie down right after eating.  Eat small meals throughout the day instead of 3 large meals.  Stop smoking if you smoke.  Maintain a healthy weight.  Identify foods and beverages that make your symptoms worse and avoid them. Foods you may want to avoid include:  Peppers.  Chocolate.  High-fat foods, including fried foods.  Spicy foods.  Garlic and onions.  Citrus fruits, including oranges, grapefruit, lemons, and limes.  Food containing tomatoes or tomato products.  Mint.  Carbonated drinks, caffeinated drinks, and alcohol.  Vinegar. SEEK IMMEDIATE MEDICAL CARE IF:  You have severe chest pain that goes down your arm or into your jaw or neck.  You feel sweaty, dizzy, or lightheaded.  You are short of breath.  You vomit blood.  You have difficulty or pain with swallowing.  You have bloody or black, tarry stools.  You have episodes of heartburn more than 3 times a week for more than 2 weeks. MAKE SURE YOU:  Understand these instructions.  Will watch your condition.  Will get help right away if you are not doing well or get worse. Document Released: 08/30/2008 Document Revised: 07/06/2011 Document Reviewed: 09/28/2010 [facility] Patient Information 2015 [place], [place]. This information is not intended to replace advice given to you by your health care provider. Make sure you discuss any questions you have with your health care provider.  Migraine Headache A  migraine headache is an intense, throbbing pain on one or both sides of your head. A migraine can last for 30 minutes to several hours. CAUSES  The exact cause of a migraine headache is not always known. However, a migraine may be caused when nerves in the brain become irritated and release chemicals that cause inflammation. This causes pain. Certain things may also trigger migraines, such  as:  Alcohol.  Smoking.  Stress.  Menstruation.  Aged cheeses.  Foods or drinks that contain nitrates, glutamate, aspartame, or tyramine.  Lack of sleep.  Chocolate.  Caffeine.  Hunger.  Physical exertion.  Fatigue.  Medicines used to treat chest pain (nitroglycerine), birth control pills, estrogen, and some blood pressure medicines. SIGNS AND SYMPTOMS  Pain on one or both sides of your head.  Pulsating or throbbing pain.  Severe pain that prevents daily activities.  Pain that is aggravated by any physical activity.  Nausea, vomiting, or both.  Dizziness.  Pain with exposure to bright lights, loud noises, or activity.  General sensitivity to bright lights, loud noises, or smells. Before you get a migraine, you may get warning signs that a migraine is coming (aura). An aura may include:  Seeing flashing lights.  Seeing bright spots, halos, or zig-zag lines.  Having tunnel vision or blurred vision.  Having feelings of numbness or tingling.  Having trouble talking.  Having muscle weakness. DIAGNOSIS  A migraine headache is often diagnosed based on:  Symptoms.  Physical exam.  A CT scan or MRI of your head. These imaging tests cannot diagnose migraines, but they can help rule out other causes of headaches. TREATMENT Medicines may be given for pain and nausea. Medicines can also be given to help prevent recurrent migraines.  HOME CARE INSTRUCTIONS  Only take over-the-counter or prescription medicines for pain or discomfort as directed by your health care provider. The use of long-term narcotics is not recommended.  Lie down in a dark, quiet room when you have a migraine.  Keep a journal to find out what may trigger your migraine headaches. For example, write down:  What you eat and drink.  How much sleep you get.  Any change to your diet or medicines.  Limit alcohol consumption.  Quit smoking if you smoke.  Get 7-9 hours of sleep, or as  recommended by your health care provider.  Limit stress.  Keep lights dim if bright lights bother you and make your migraines worse. SEEK IMMEDIATE MEDICAL CARE IF:   Your migraine becomes severe.  You have a fever.  You have a stiff neck.  You have vision loss.  You have muscular weakness or loss of muscle control.  You start losing your balance or have trouble walking.  You feel faint or pass out.  You have severe symptoms that are different from your first symptoms. MAKE SURE YOU:   Understand these instructions.  Will watch your condition.  Will get help right away if you are not doing well or get worse. Document Released: 04/13/2005 Document Revised: 02/01/2013 Document Reviewed: 12/19/2012 [facility] Patient Information 2015 [place], [place]. This information is not intended to replace advice given to you by your health care provider. Make sure you discuss any questions you have with your health care provider. RESOURCE GUIDE  Chronic Pain Problems: Contact [facility]  [phone] Patients need to be referred by their primary care doctor.  Insufficient Money for Medicine: Contact [employer]:  call "211."   No Primary Care Doctor: -  Call [facility]  [phone] - can help you locate a primary care doctor that  accepts your insurance, provides certain services, etc. - Physician Referral Service- [phone]  Agencies that provide inexpensive medical care: - [facility]  [facility]  [phone] - [facility]  [phone] - [facility]  [phone] - [facility]  [phone] - [facility]  [phone]  [place] Providers: - [facility]- [address]  [phone], Mon-Fri 9am-7pm, Sat 9am-1pm - [place], [address]  [place], [address]  [place]  [phone] [facility]- [address]  [place], [address], [phone]  Only accepts [place] [facility] [place] patients after they have their name  applied to their card  Self Pay (no insurance) in [place]: - Sickle Cell Patients: Dr Kevan Ny, [facility]  [place], [facility]- [place]  [facility] [place]- [address], [facility]- see information above (Speak to [employer] if you do not have insurance)       -  [facility]- [place],  [place], [place]  Dr Vista Lawman-  [address], [place], [place], [place]       -  [facility] and [facility] - [address] [phone]       -  [facility]- [address], [place], also [address] [phone]       -    [facility]- [address], [place], 1st & 3rd Saturday        every month, 10am-1pm  1) Find a Doctor and Pay Out of Pocket Although you won't have to find out who is covered by your insurance plan, it is a good idea to ask around and get recommendations. You will then need to call the office and see if the doctor you have chosen will accept you as a new patient and what types of options they offer for patients who are self-pay. Some doctors offer discounts or will set up payment plans for their patients who do not have insurance, but you will need to ask so you aren't surprised when you get to your appointment.  2) Contact Your Local Health Department Not all health departments have doctors that can see patients for sick visits, but many do, so it is worth a call to see if yours does. If you don't know where your local health department is, you can check in your phone book. The CDC also has a  tool to help you locate your state's health department, and many state websites also have listings of all of their local health departments.  3) Find a [facility] If your illness is not likely to be very severe or complicated, you may want to try a walk in clinic. These are popping up all over the country in pharmacies, drugstores, and shopping centers. They're usually staffed by nurse practitioners  or physician assistants that have been trained to treat common illnesses and complaints. They're usually fairly quick and inexpensive. However, if you have serious medical issues or chronic medical problems, these are probably not your best option

## 2013-11-09 NOTE — ED Notes (Signed)
Per pt, states is having heartburn with no relief with tums-states unable to control bowels and urine-and want to talk to MD about some other personal issues

## 2013-11-09 NOTE — ED Provider Notes (Signed)
CSN: [id number]     Arrival date & time 11/09/13  1401 History   First MD Initiated Contact with Patient 11/09/13 1503     Chief Complaint  Patient presents with  . Heartburn     (Consider location/radiation/quality/duration/timing/severity/associated sxs/prior Treatment) HPI Comments: 38 year old male with a past medical history of diabetes and HTN presents to the emergency department with multiple complaints. First, patient states he is experiencing heartburn, intermittent since after eating chili around 5-6:00 PM last night. The pain come and go at random, described as sharp, non-radiating, located in the Center of his chest "around his heart". States this feels similar to heartburn that he has had in the past. He tried taking TUMS with no relief of his symptoms. Admits to associated nausea without vomiting. Denies shortness of breath. Patient is also complaining of a migraine headache beginning "in the middle of the night last night". Headache located on the right side of his head described as throbbing and constant. This feels similar to the same migraines he has had in the past. 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 He has not tried any alleviating factors for his headache. Denies fever, chills, neck pain or stiffness, dizziness or lightheadedness, photophobia or phonophobia. Patient also endorses increased urinary and bowel urgency over the past month, states when he needs go to the bathroom, both urinating and defecating he starts going in his pants. Denies any back pain or injury. Denies increased urinary frequency, dysuria, bloody stools. States he has had intermittent diarrhea over the past month. States whenever he takes metformin he tends to have loose stools. He does not have a primary care physician. Denies family hx of early heart disease.  Patient is a 38 y.o. male presenting with heartburn. The  history is provided by the patient.  Heartburn Associated symptoms include chest pain ("heartburn"), headaches and nausea.    Past Medical History  Diagnosis Date  . Diabetes mellitus   . Hypertension   . Priapism   . Tobacco use    Past Surgical History  Procedure Laterality Date  . Penectomy    . Incision and drainage perirectal abscess  07/28/2011    Procedure: IRRIGATION AND DEBRIDEMENT PERIRECTAL ABSCESS;  Surgeon: Adin Hector, MD;  Location: [facility];  Service: General;  Laterality: N/A;   of skin muscle and subcutaneous tissue of perimeum  8cmx12cm area    No family history on file. History  Substance Use Topics  . Smoking status: Current Some Day Smoker -- 0.50 packs/day for 8 years    Types: Cigarettes  . Smokeless tobacco: Never Used  . Alcohol Use: No    Review of Systems  Cardiovascular: Positive for chest pain ("heartburn").  Gastrointestinal: Positive for heartburn, nausea and diarrhea.  Genitourinary: Positive for urgency.  Neurological: Positive for headaches.  All other systems reviewed and are negative.     Allergies  Review of patient's allergies indicates no known allergies.  Home Medications   Prior to Admission medications   Medication Sig Start Date End Date Taking? Authorizing Provider  metFORMIN (GLUCOPHAGE) 1000 MG tablet Take 1 tablet (1,000 mg total) by mouth 2 (two) times daily. 05/05/12  Yes Nicole Pisciotta, PA-C  famotidine (PEPCID) 20 MG tablet Take 1 tablet (20 mg total) by mouth 2 (two) times daily. 11/09/13   Illene Labrador, PA-C   BP 182/87  Pulse 50  Temp(Src) 98 F (  36.7 C) (Oral)  Resp 16  SpO2 98% Physical Exam  Nursing note and vitals reviewed. Constitutional: He is oriented to person, place, and time. He appears well-developed and well-nourished. No distress.  HENT:  Head: Normocephalic and atraumatic.  Mouth/Throat: Oropharynx is clear and moist.  Eyes: Conjunctivae and EOM are normal.  Right eye cataract.  Neck:  Normal range of motion. Neck supple. No JVD present.  No meningismus.  Cardiovascular: Normal rate, regular rhythm, normal heart sounds and intact distal pulses.   No extremity edema.  Pulmonary/Chest: Effort normal and breath sounds normal. No respiratory distress.  Abdominal: Soft. Normal appearance and bowel sounds are normal. He exhibits no distension. There is no CVA tenderness.  Mild RLQ and LLQ tenderness. No peritoneal signs.  Musculoskeletal: Normal range of motion. He exhibits no edema.  Neurological: He is alert and oriented to person, place, and time. He has normal strength. No cranial nerve deficit or sensory deficit. Coordination and gait normal.  Speech fluent, goal oriented. Moves limbs without ataxia. Equal grip strength bilateral.  Skin: Skin is warm and dry. He is not diaphoretic.  Psychiatric: He has a normal mood and affect. His behavior is normal.    ED Course  Procedures (including critical care time) Labs Review Labs Reviewed  CBC - Abnormal; Notable for the following:    WBC 12.0 (*)    RBC 6.75 (*)    MCV 60.1 (*)    MCH 19.4 (*)    All other components within normal limits  COMPREHENSIVE METABOLIC PANEL - Abnormal; Notable for the following:    Sodium 136 (*)    Glucose, Bld 255 (*)    All other components within normal limits  URINALYSIS, ROUTINE W REFLEX MICROSCOPIC - Abnormal; Notable for the following:    Glucose, UA 100 (*)    Hgb urine dipstick TRACE (*)    Ketones, ur 15 (*)    Protein, ur 30 (*)    All other components within normal limits  CBG MONITORING, ED - Abnormal; Notable for the following:    Glucose-Capillary 260 (*)    All other components within normal limits  URINE MICROSCOPIC-ADD ON  Randolm Idol, ED    Imaging Review Dg Chest 2 View  11/09/2013   CLINICAL DATA:  Chest pain.  EXAM: CHEST  2 VIEW  COMPARISON:  None.  FINDINGS: Mediastinum hilar structures normal. Lungs are clear. Heart size normal.  IMPRESSION: No active  cardiopulmonary disease.   Electronically Signed   By: Marcello Moores  Register   On: 11/09/2013 16:07     EKG Interpretation   Date/Time:  Thursday November 09 2013 15:29:40 EDT Ventricular Rate:  63 PR Interval:  159 QRS Duration: 75 QT Interval:  418 QTC Calculation: 428 R Axis:   28 Text Interpretation:  Sinus arrhythmia Probable left atrial enlargement  Probable left ventricular hypertrophy ST elevation suggests acute  pericarditis Baseline wander in lead(s) V1 V4 Confirmed by ALLEN  MD,  ANTHONY ([id number]) on 11/09/2013 4:29:22 PM      MDM   Final diagnoses:  Heartburn  Migraine without aura and without status migrainosus, not intractable   Pt presenting with multiple complaints. He is non-toxic appearing and in NAD. Afebrile, hypertensive 208/97, vitals otherwise stable. Labs, UA, CXR pending. CP workup. Pt is a smoker, hypertensive, does not have PCP. Will treat migraine. 5:49 PM Labs show a mild leukocytosis of 12.0. Glucose 255. She urine without any evidence of infection. Patient reports he is feeling much better after  receiving GI cocktail, Toradol and Reglan. Tolerates PO without return of the symptoms. Symptoms most likely related to heartburn given a started after eating chili. Doubt cardiac in nature, HEART score 2. Will discharge with Pepcid. Resources given for PCP followup, discussed importance of establishing care with a PCP. Stable for d/c. Return precautions given. Patient states understanding of treatment care plan and is agreeable.  Illene Labrador, PA-C 11/09/13 1752

## 2013-11-09 NOTE — ED Provider Notes (Signed)
Medical screening examination/treatment/procedure(s) were performed by non-physician practitioner and as supervising physician I was immediately available for consultation/collaboration.   Leota Jacobsen, MD 11/09/13 2250

## 2013-11-09 NOTE — Progress Notes (Signed)
  CARE MANAGEMENT ED NOTE 11/09/2013  Patient:  Frank Schneider, Frank Schneider   Account Number:  [id number]  Date Initiated:  11/09/2013  Documentation initiated by:  Livia Snellen  Subjective/Objective Assessment:   Patient reports to Ed with heartburn, headaches and nausea     Subjective/Objective Assessment Detail:   Patient with pmhx of HTN and DM     Action/Plan:   Action/Plan Detail:   Anticipated DC Date:  11/09/2013     Status Recommendation to Physician:   Result of Recommendation:    Other ED [place]  Other  PCP issues    Choice offered to / List presented to:            Status of service:  Completed, signed off  ED Comments:   ED Comments Detail:  EDCM spoke to patient at bedside.  Patient confirms he does not have a pcp or insurance.  Patient reports he is diabetic. Patient is currently taking Metformin and does not report difficulty affording medications.  Patient does not take insulin.  Patient reports he has a glucometer at home but he thinks it is broken.  EDCM provided patent with a brochure to [facility] and wellness clinic.  [facility] informed patient that walkins are welcome from 9am-1030am Mon-Thurs.  EDCM also informedpatient that he may enroll fro the orange card at [facility].  Patient currently getting his medications filled at [facility].  EDCM received permission to email [facility] in attempts to schedule him an appointment. Patient thankful for resources and assistance.  Email placed for appointment to [facility].  No further EDCM needs at this time.

## 2014-03-14 ENCOUNTER — Emergency Department (INDEPENDENT_AMBULATORY_CARE_PROVIDER_SITE_OTHER)
Admission: EM | Admit: 2014-03-14 | Discharge: 2014-03-14 | Disposition: A | Discharge status: Home / Self Care | Payer: Self-pay | Source: Home / Self Care | Attending: Family Medicine | Admitting: Family Medicine

## 2014-03-14 ENCOUNTER — Encounter (HOSPITAL_COMMUNITY): Payer: Self-pay | Admitting: *Deleted

## 2014-03-14 DIAGNOSIS — I10 Essential (primary) hypertension: Secondary | ICD-10-CM

## 2014-03-14 DIAGNOSIS — E1165 Type 2 diabetes mellitus with hyperglycemia: Secondary | ICD-10-CM

## 2014-03-14 LAB — POCT URINALYSIS DIP (DEVICE)
BILIRUBIN URINE: NEGATIVE
Glucose, UA: >=1000 mg/dL — AB
KETONES UR: NEGATIVE mg/dL
Nitrite: NEGATIVE
Protein, ur: NEGATIVE mg/dL
SPECIFIC GRAVITY, URINE: 1.025 (ref 1.005–1.030)
Urobilinogen, UA: 0.2 mg/dL (ref 0.0–1.0)
pH: 5.5 (ref 5.0–8.0)

## 2014-03-14 LAB — POCT I-STAT, CHEM 8
BUN: 12 mg/dL (ref 6–23)
CHLORIDE: 101 meq/L (ref 96–112)
CREATININE: 0.9 mg/dL (ref 0.50–1.35)
Calcium, Ion: 1.22 mmol/L (ref 1.12–1.23)
Glucose, Bld: 308 mg/dL — ABNORMAL HIGH (ref 70–99)
HEMATOCRIT: 42 % (ref 39.0–52.0)
HEMOGLOBIN: 14.3 g/dL (ref 13.0–17.0)
Potassium: 4.1 mEq/L (ref 3.7–5.3)
SODIUM: 137 meq/L (ref 137–147)
TCO2: 25 mmol/L (ref 0–100)

## 2014-03-14 MED ORDER — METFORMIN HCL 500 MG PO TABS: 1000.0000 mg | ORAL_TABLET | Freq: Two times a day (BID) | ORAL | Status: DC

## 2014-03-14 MED ORDER — LISINOPRIL 20 MG PO TABS: 20.0000 mg | ORAL_TABLET | Freq: Every day | ORAL | Status: DC

## 2014-03-14 NOTE — Discharge Instructions (Signed)
How to Avoid Diabetes Problems You can do a lot to prevent or slow down diabetes problems. Following your diabetes plan and taking care of yourself can reduce your risk of serious or life-threatening complications. Below, you will find certain things you can do to prevent diabetes problems. MANAGE YOUR DIABETES Follow your health care provider's, nurse educator's, and dietitian's instructions for managing your diabetes. They will teach you the basics of diabetes care. They can help answer questions you may have. Learn about diabetes and make healthy choices regarding eating and physical activity. Monitor your blood glucose level regularly. Your health care provider will help you decide how often to check your blood glucose level depending on your treatment goals and how well you are meeting them.  DO NOT USE NICOTINE Nicotine and diabetes are a dangerous combination. Nicotine raises your risk for diabetes problems. If you quit using nicotine, you will lower your risk for heart attack, stroke, nerve disease, and kidney disease. Your cholesterol and your blood pressure levels may improve. Your blood circulation will also improve. Do not use any tobacco products, including cigarettes, chewing tobacco, or electronic cigarettes. If you need help quitting, ask your health care provider. KEEP YOUR BLOOD PRESSURE UNDER CONTROL Keeping your blood pressure under control will help prevent damage to your eyes, kidneys, heart, and blood vessels. Blood pressure consists of two numbers. The top number should be below 120, and the bottom number should be below 80 (120/80). Keep your blood pressure as close to these numbers as you can. If you already have kidney disease, you may want even lower blood pressure to protect your kidneys. Talk to your health care provider to make sure that your blood pressure goal is right for your needs. Meal planning, medicines, and exercise can help you reach your blood pressure target. Have  your blood pressure checked at every visit with your health care provider. KEEP YOUR CHOLESTEROL UNDER CONTROL Normal cholesterol levels will help prevent heart disease and stroke. These are the biggest health problems for people with diabetes. Keeping cholesterol levels under control can also help with blood flow. Have your cholesterol level checked at least once a year. Your health care provider may prescribe a medicine known as a statin. Statins lower your cholesterol. If you are not taking a statin, ask your health care provider if you should be. Meal planning, exercise, and medicines can help you reach your cholesterol targets.  SCHEDULE AND KEEP YOUR ANNUAL PHYSICAL EXAMS AND EYE EXAMS Your health care provider will tell you how often he or she wants to see you depending on your plan of treatment. It is important that you keep these appointments so that possible problems can be identified early and complications can be avoided or treated.  Every visit with your health care provider should include your weight, blood pressure, and an evaluation of your blood glucose control.  Your hemoglobin A1c should be checked:  At least twice a year if you are at your goal.  Every 3 months if there are changes in treatment.  If you are not meeting your goals.  Your blood lipids should be checked yearly. You should also be checked yearly to see if you have protein in your urine (microalbumin).  Schedule a dilated eye exam within 5 years of your diagnosis if you have type 1 diabetes, and then yearly. Schedule a dilated eye exam at diagnosis if you have type 2 diabetes, and then yearly. All exams thereafter can be extended to every 2  to 3 years if one or more exams have been normal. KEEP YOUR VACCINES CURRENT The flu vaccine is recommended yearly. The formula for the vaccine changes every year and needs to be updated for the best protection against current viruses. It is recommended that people with diabetes  who are over 70 years old get the pneumonia vaccine. In some cases, two separate shots may be given. Ask your health care provider if your pneumonia vaccination is up-to-date. However, there are some instances where another vaccine is recommended. Check with your health care provider. TAKE CARE OF YOUR FEET  Diabetes may cause you to have a poor blood supply (circulation) to your legs and feet. Because of this, the skin may be thinner, break easier, and heal more slowly. You also may have nerve damage in your legs and feet, causing decreased feeling. You may not notice minor injuries to your feet that could lead to serious problems or infections. Taking care of your feet is very important. Visual foot exams are performed at every routine medical visit. The exams check for cuts, injuries, or other problems with the feet. A comprehensive foot exam should be done yearly. This includes visual inspection as well as assessing foot pulses and testing for loss of sensation. You should also do the following:  Inspect your feet daily for cuts, calluses, blisters, ingrown toenails, and signs of infection, such as redness, swelling, or pus.  Wash and dry your feet thoroughly, especially between the toes.  Avoid soaking your feet regularly in hot water baths.  Moisturize dry skin with lotion, avoiding areas between your toes.  Cut toenails straight across and file the edges.  Avoid shoes that do not fit well or have areas that irritate your skin.  Avoid going barefooted or wearing only socks. Your feet need protection. TAKE CARE OF YOUR TEETH People with poorly controlled diabetes are more likely to have gum (periodontal) disease. These infections make diabetes harder to control. Periodontal diseases, if left untreated, can lead to tooth loss. Brush your teeth twice a day, floss, and see your dentist for checkups and cleaning every 6 months, or 2 times a year. ASK YOUR HEALTH CARE PROVIDER ABOUT TAKING  ASPIRIN Taking aspirin daily is recommended to help prevent cardiovascular disease in people with and without diabetes. Ask your health care provider if this would benefit you and what dose he or she would recommend. DRINK RESPONSIBLY Moderate amounts of alcohol (less than 1 drink per day for adult women and less than 2 drinks per day for adult men) have a minimal effect on blood glucose if ingested with food. It is important to eat food with alcohol to avoid hypoglycemia. People should avoid alcohol if they have a history of alcohol abuse or dependence, if they are pregnant, and if they have liver disease, pancreatitis, advanced neuropathy, or severe hypertriglyceridemia. LESSEN STRESS Living with diabetes can be stressful. When you are under stress, your blood glucose may be affected in two ways:  Stress hormones may cause your blood glucose to rise.  You may be distracted from taking good care of yourself. It is a good idea to be aware of your stress level and make changes that are necessary to help you better manage challenging situations. Support groups, planned relaxation, a hobby you enjoy, meditation, healthy relationships, and exercise all work to lower your stress level. If your efforts do not seem to be helping, get help from your health care provider or a trained mental health professional. Document  Released: 12/30/2010 Document Revised: 08/28/2013 Document Reviewed: 06/07/2013 [facility] Patient Information 2015 [place], [place]. This information is not intended to replace advice given to you by your health care provider. Make sure you discuss any questions you have with your health care provider.  Hypertension Hypertension, commonly called high blood pressure, is when the force of blood pumping through your arteries is too strong. Your arteries are the blood vessels that carry blood from your heart throughout your body. A blood pressure reading consists of a higher number over a lower  number, such as 110/72. The higher number (systolic) is the pressure inside your arteries when your heart pumps. The lower number (diastolic) is the pressure inside your arteries when your heart relaxes. Ideally you want your blood pressure below 120/80. Hypertension forces your heart to work harder to pump blood. Your arteries may become narrow or stiff. Having hypertension puts you at risk for heart disease, stroke, and other problems.  RISK FACTORS Some risk factors for high blood pressure are controllable. Others are not.  Risk factors you cannot control include:   Race. You may be at higher risk if you are African American.  Age. Risk increases with age.  Gender. Men are at higher risk than women before age 83 years. After age 65, women are at higher risk than men. Risk factors you can control include:  Not getting enough exercise or physical activity.  Being overweight.  Getting too much fat, sugar, calories, or salt in your diet.  Drinking too much alcohol. SIGNS AND SYMPTOMS Hypertension does not usually cause signs or symptoms. Extremely high blood pressure (hypertensive crisis) may cause headache, anxiety, shortness of breath, and nosebleed. DIAGNOSIS  To check if you have hypertension, your health care provider will measure your blood pressure while you are seated, with your arm held at the level of your heart. It should be measured at least twice using the same arm. Certain conditions can cause a difference in blood pressure between your right and left arms. A blood pressure reading that is higher than normal on one occasion does not mean that you need treatment. If one blood pressure reading is high, ask your health care provider about having it checked again. TREATMENT  Treating high blood pressure includes making lifestyle changes and possibly taking medicine. Living a healthy lifestyle can help lower high blood pressure. You may need to change some of your habits. Lifestyle  changes may include:  Following the DASH diet. This diet is high in fruits, vegetables, and whole grains. It is low in salt, red meat, and added sugars.  Getting at least 2 hours of brisk physical activity every week.  Losing weight if necessary.  Not smoking.  Limiting alcoholic beverages.  Learning ways to reduce stress. If lifestyle changes are not enough to get your blood pressure under control, your health care provider may prescribe medicine. You may need to take more than one. Work closely with your health care provider to understand the risks and benefits. HOME CARE INSTRUCTIONS  Have your blood pressure rechecked as directed by your health care provider.   Take medicines only as directed by your health care provider. Follow the directions carefully. Blood pressure medicines must be taken as prescribed. The medicine does not work as well when you skip doses. Skipping doses also puts you at risk for problems.   Do not smoke.   Monitor your blood pressure at home as directed by your health care provider. SEEK MEDICAL CARE IF:  You think you are having a reaction to medicines taken.  You have recurrent headaches or feel dizzy.  You have swelling in your ankles.  You have trouble with your vision. SEEK IMMEDIATE MEDICAL CARE IF:  You develop a severe headache or confusion.  You have unusual weakness, numbness, or feel faint.  You have severe chest or abdominal pain.  You vomit repeatedly.  You have trouble breathing. MAKE SURE YOU:   Understand these instructions.  Will watch your condition.  Will get help right away if you are not doing well or get worse. Document Released: 04/13/2005 Document Revised: 08/28/2013 Document Reviewed: 02/03/2013 [facility] Patient Information 2015 [place], [place]. This information is not intended to replace advice given to you by your health care provider. Make sure you discuss any questions you have with your health care  provider.  Type 2 Diabetes Mellitus Type 2 diabetes mellitus, often simply referred to as type 2 diabetes, is a long-lasting (chronic) disease. In type 2 diabetes, the pancreas does not make enough insulin (a hormone), the cells are less responsive to the insulin that is made (insulin resistance), or both. Normally, insulin moves sugars from food into the tissue cells. The tissue cells use the sugars for energy. The lack of insulin or the lack of normal response to insulin causes excess sugars to build up in the blood instead of going into the tissue cells. As a result, high blood sugar (hyperglycemia) develops. The effect of high sugar (glucose) levels can cause many complications. Type 2 diabetes was also previously called adult-onset diabetes, but it can occur at any age.  RISK FACTORS  A person is predisposed to developing type 2 diabetes if someone in the family has the disease and also has one or more of the following primary risk factors:  Overweight.  An inactive lifestyle.  A history of consistently eating high-calorie foods. Maintaining a normal weight and regular physical activity can reduce the chance of developing type 2 diabetes. SYMPTOMS  A person with type 2 diabetes may not show symptoms initially. The symptoms of type 2 diabetes appear slowly. The symptoms include:  Increased thirst (polydipsia).  Increased urination (polyuria).  Increased urination during the night (nocturia).  Weight loss. This weight loss may be rapid.  Frequent, recurring infections.  Tiredness (fatigue).  Weakness.  Vision changes, such as blurred vision.  Fruity smell to your breath.  Abdominal pain.  Nausea or vomiting.  Cuts or bruises which are slow to heal.  Tingling or numbness in the hands or feet. DIAGNOSIS Type 2 diabetes is frequently not diagnosed until complications of diabetes are present. Type 2 diabetes is diagnosed when symptoms or complications are present and when  blood glucose levels are increased. Your blood glucose level may be checked by one or more of the following blood tests:  A fasting blood glucose test. You will not be allowed to eat for at least 8 hours before a blood sample is taken.  A random blood glucose test. Your blood glucose is checked at any time of the day regardless of when you ate.  A hemoglobin A1c blood glucose test. A hemoglobin A1c test provides information about blood glucose control over the previous 3 months.  An oral glucose tolerance test (OGTT). Your blood glucose is measured after you have not eaten (fasted) for 2 hours and then after you drink a glucose-containing beverage. TREATMENT   You may need to take insulin or diabetes medicine daily to keep blood glucose levels in the  desired range.  If you use insulin, you may need to adjust the dosage depending on the carbohydrates that you eat with each meal or snack. The treatment goal is to maintain the before meal blood sugar (preprandial glucose) level at 70-130 mg/dL. HOME CARE INSTRUCTIONS   Have your hemoglobin A1c level checked twice a year.  Perform daily blood glucose monitoring as directed by your health care provider.  Monitor urine ketones when you are ill and as directed by your health care provider.  Take your diabetes medicine or insulin as directed by your health care provider to maintain your blood glucose levels in the desired range.  Never run out of diabetes medicine or insulin. It is needed every day.  If you are using insulin, you may need to adjust the amount of insulin given based on your intake of carbohydrates. Carbohydrates can raise blood glucose levels but need to be included in your diet. Carbohydrates provide vitamins, minerals, and fiber which are an essential part of a healthy diet. Carbohydrates are found in fruits, vegetables, whole grains, dairy products, legumes, and foods containing added sugars.  Eat healthy foods. You should  make an appointment to see a registered dietitian to help you create an eating plan that is right for you.  Lose weight if you are overweight.  Carry a medical alert card or wear your medical alert jewelry.  Carry a 15-gram carbohydrate snack with you at all times to treat low blood glucose (hypoglycemia). Some examples of 15-gram carbohydrate snacks include:  Glucose tablets, 3 or 4.  Glucose gel, 15-gram tube.  Raisins, 2 tablespoons (24 grams).  Jelly beans, 6.  Animal crackers, 8.  Regular pop, 4 ounces (120 mL).  Gummy treats, 9.  Recognize hypoglycemia. Hypoglycemia occurs with blood glucose levels of 70 mg/dL and below. The risk for hypoglycemia increases when fasting or skipping meals, during or after intense exercise, and during sleep. Hypoglycemia symptoms can include:  Tremors or shakes.  Decreased ability to concentrate.  Sweating.  Increased heart rate.  Headache.  Dry mouth.  Hunger.  Irritability.  Anxiety.  Restless sleep.  Altered speech or coordination.  Confusion.  Treat hypoglycemia promptly. If you are alert and able to safely swallow, follow the 15:15 rule:  Take 15-20 grams of rapid-acting glucose or carbohydrate. Rapid-acting options include glucose gel, glucose tablets, or 4 ounces (120 mL) of fruit juice, regular soda, or low-fat milk.  Check your blood glucose level 15 minutes after taking the glucose.  Take 15-20 grams more of glucose if the repeat blood glucose level is still 70 mg/dL or below.  Eat a meal or snack within 1 hour once blood glucose levels return to normal.  Be alert to feeling very thirsty and urinating more frequently than usual, which are early signs of hyperglycemia. An early awareness of hyperglycemia allows for prompt treatment. Treat hyperglycemia as directed by your health care provider.  Engage in at least 150 minutes of moderate-intensity physical activity a week, spread over at least 3 days of the week  or as directed by your health care provider. In addition, you should engage in resistance exercise at least 2 times a week or as directed by your health care provider. Try to spend no more than 90 minutes at one time inactive.  Adjust your medicine and food intake as needed if you start a new exercise or sport.  Follow your sick-day plan anytime you are unable to eat or drink as usual.  Do not use  any tobacco products including cigarettes, chewing tobacco, or electronic cigarettes. If you need help quitting, ask your health care provider.  Limit alcohol intake to no more than 1 drink per day for nonpregnant women and 2 drinks per day for men. You should drink alcohol only when you are also eating food. Talk with your health care provider whether alcohol is safe for you. Tell your health care provider if you drink alcohol several times a week.  Keep all follow-up visits as directed by your health care provider. This is important.  Schedule an eye exam soon after the diagnosis of type 2 diabetes and then annually.  Perform daily skin and foot care. Examine your skin and feet daily for cuts, bruises, redness, nail problems, bleeding, blisters, or sores. A foot exam by a health care provider should be done annually.  Brush your teeth and gums at least twice a day and floss at least once a day. Follow up with your dentist regularly.  Share your diabetes management plan with your workplace or school.  Stay up-to-date with immunizations. It is recommended that people with diabetes who are over 73 years old get the pneumonia vaccine. In some cases, two separate shots may be given. Ask your health care provider if your pneumonia vaccination is up-to-date.  Learn to manage stress.  Obtain ongoing diabetes education and support as needed.  Participate in or seek rehabilitation as needed to maintain or improve independence and quality of life. Request a physical or occupational therapy referral if you  are having foot or hand numbness, or difficulties with grooming, dressing, eating, or physical activity. SEEK MEDICAL CARE IF:   You are unable to eat food or drink fluids for more than 6 hours.  You have nausea and vomiting for more than 6 hours.  Your blood glucose level is over 240 mg/dL.  There is a change in mental status.  You develop an additional serious illness.  You have diarrhea for more than 6 hours.  You have been sick or have had a fever for a couple of days and are not getting better.  You have pain during any physical activity.  SEEK IMMEDIATE MEDICAL CARE IF:  You have difficulty breathing.  You have moderate to large ketone levels. MAKE SURE YOU:  Understand these instructions.  Will watch your condition.  Will get help right away if you are not doing well or get worse. Document Released: 04/13/2005 Document Revised: 08/28/2013 Document Reviewed: 11/10/2011 [facility] Patient Information 2015 [place], [place]. This information is not intended to replace advice given to you by your health care provider. Make sure you discuss any questions you have with your health care provider.  Managing Your High Blood Pressure Blood pressure is a measurement of how forceful your blood is pressing against the walls of the arteries. Arteries are muscular tubes within the circulatory system. Blood pressure does not stay the same. Blood pressure rises when you are active, excited, or nervous; and it lowers during sleep and relaxation. If the numbers measuring your blood pressure stay above normal most of the time, you are at risk for health problems. High blood pressure (hypertension) is a long-term (chronic) condition in which blood pressure is elevated. A blood pressure reading is recorded as two numbers, such as 120 over 80 (or 120/80). The first, higher number is called the systolic pressure. It is a measure of the pressure in your arteries as the heart beats. The second, lower  number is called the diastolic pressure.  It is a measure of the pressure in your arteries as the heart relaxes between beats.  Keeping your blood pressure in a normal range is important to your overall health and prevention of health problems, such as heart disease and stroke. When your blood pressure is uncontrolled, your heart has to work harder than normal. High blood pressure is a very common condition in adults because blood pressure tends to rise with age. Men and women are equally likely to have hypertension but at different times in life. Before age 90, men are more likely to have hypertension. After 38 years of age, women are more likely to have it. Hypertension is especially common in African Americans. This condition often has no signs or symptoms. The cause of the condition is usually not known. Your caregiver can help you come up with a plan to keep your blood pressure in a normal, healthy range. BLOOD PRESSURE STAGES Blood pressure is classified into four stages: normal, prehypertension, stage 1, and stage 2. Your blood pressure reading will be used to determine what type of treatment, if any, is necessary. Appropriate treatment options are tied to these four stages:  Normal  Systolic pressure (mm Hg): below 120.  Diastolic pressure (mm Hg): below 80. Prehypertension  Systolic pressure (mm Hg): 120 to 139.  Diastolic pressure (mm Hg): 80 to 89. Stage1  Systolic pressure (mm Hg): 140 to 159.  Diastolic pressure (mm Hg): 90 to 99. Stage2  Systolic pressure (mm Hg): 160 or above.  Diastolic pressure (mm Hg): 100 or above. RISKS RELATED TO HIGH BLOOD PRESSURE Managing your blood pressure is an important responsibility. Uncontrolled high blood pressure can lead to:  A heart attack.  A stroke.  A weakened blood vessel (aneurysm).  Heart failure.  Kidney damage.  Eye damage.  Metabolic syndrome.  Memory and concentration problems. HOW TO MANAGE YOUR BLOOD  PRESSURE Blood pressure can be managed effectively with lifestyle changes and medicines (if needed). Your caregiver will help you come up with a plan to bring your blood pressure within a normal range. Your plan should include the following: Education  Read all information provided by your caregivers about how to control blood pressure.  Educate yourself on the latest guidelines and treatment recommendations. New research is always being done to further define the risks and treatments for high blood pressure. Lifestylechanges  Control your weight.  Avoid smoking.  Stay physically active.  Reduce the amount of salt in your diet.  Reduce stress.  Control any chronic conditions, such as high cholesterol or diabetes.  Reduce your alcohol intake. Medicines  Several medicines (antihypertensive medicines) are available, if needed, to bring blood pressure within a normal range. Communication  Review all the medicines you take with your caregiver because there may be side effects or interactions.  Talk with your caregiver about your diet, exercise habits, and other lifestyle factors that may be contributing to high blood pressure.  See your caregiver regularly. Your caregiver can help you create and adjust your plan for managing high blood pressure. RECOMMENDATIONS FOR TREATMENT AND FOLLOW-UP  The following recommendations are based on current guidelines for managing high blood pressure in nonpregnant adults. Use these recommendations to identify the proper follow-up period or treatment option based on your blood pressure reading. You can discuss these options with your caregiver.  Systolic pressure of 409 to 811 or diastolic pressure of 80 to 89: Follow up with your caregiver as directed.  Systolic pressure of 914 to 782 or diastolic  pressure of 90 to 100: Follow up with your caregiver within 2 months.  Systolic pressure above 248 or diastolic pressure above 250: Follow up with your  caregiver within 1 month.  Systolic pressure above 037 or diastolic pressure above 048: Consider antihypertensive therapy; follow up with your caregiver within 1 week.  Systolic pressure above 889 or diastolic pressure above 169: Begin antihypertensive therapy; follow up with your caregiver within 1 week. Document Released: 01/06/2012 Document Reviewed: 01/06/2012 [facility] Patient Information 2015 [place]. This information is not intended to replace advice given to you by your health care provider. Make sure you discuss any questions you have with your health care provider.

## 2014-03-14 NOTE — ED Provider Notes (Signed)
CSN: [id number]     Arrival date & time 03/14/14  0913 History   First MD Initiated Contact with Patient 03/14/14 [id number]     Chief Complaint  Patient presents with  . Medication Refill   (Consider location/radiation/quality/duration/timing/severity/associated sxs/prior Treatment) HPI Comments: Patient presents requesting prescription refill for his metformin. States he has been taking his mother's medications. Does not have PCP. States glucose readings at home 100-150. States he has been receiving Rx refills at ER. Unemployed Smoker   The history is provided by the patient.    Past Medical History  Diagnosis Date  . Diabetes mellitus   . Hypertension   . Priapism   . Tobacco use    Past Surgical History  Procedure Laterality Date  . Penectomy    . Incision and drainage perirectal abscess  07/28/2011    Procedure: IRRIGATION AND DEBRIDEMENT PERIRECTAL ABSCESS;  Surgeon: Adin Hector, MD;  Location: [facility];  Service: General;  Laterality: N/A;   of skin muscle and subcutaneous tissue of perimeum  8cmx12cm area    History reviewed. No pertinent family history. History  Substance Use Topics  . Smoking status: Current Some Day Smoker -- 0.50 packs/day for 8 years    Types: Cigarettes  . Smokeless tobacco: Never Used  . Alcohol Use: No    Review of Systems  All other systems reviewed and are negative.   Allergies  Review of patient's allergies indicates no known allergies.  Home Medications   Prior to Admission medications   Medication Sig Start Date End Date Taking? Authorizing Provider  famotidine (PEPCID) 20 MG tablet Take 1 tablet (20 mg total) by mouth 2 (two) times daily. 11/09/13   Robyn M Hess, PA-C  lisinopril (PRINIVIL,ZESTRIL) 20 MG tablet Take 1 tablet (20 mg total) by mouth daily. 03/14/14   Audelia Hives Presson, PA  metFORMIN (GLUCOPHAGE) 1000 MG tablet Take 1 tablet (1,000 mg total) by mouth 2 (two) times daily. 05/05/12   Nicole Pisciotta, PA-C   metFORMIN (GLUCOPHAGE) 500 MG tablet Take 2 tablets (1,000 mg total) by mouth 2 (two) times daily with a meal. 03/14/14   Annett Gula H Presson, PA   BP 178/84 mmHg  Pulse 78  Temp(Src) 98.6 F (37 C) (Oral)  Resp 16  SpO2 99% Physical Exam  Constitutional: He is oriented to person, place, and time. He appears well-developed and well-nourished. No distress.  HENT:  Head: Normocephalic and atraumatic.  Mouth/Throat: Mucous membranes are normal.  Eyes: Conjunctivae are normal. No scleral icterus.  Cardiovascular: Normal rate.   Pulmonary/Chest: Effort normal.  Musculoskeletal: Normal range of motion.  Neurological: He is alert and oriented to person, place, and time.  Skin: Skin is warm and dry. No rash noted. No erythema.  Psychiatric: He has a normal mood and affect. His behavior is normal.  Nursing note and vitals reviewed.   ED Course  Procedures (including critical care time) Labs Review Labs Reviewed  POCT I-STAT, CHEM 8 - Abnormal; Notable for the following:    Glucose, Bld 308 (*)    All other components within normal limits  POCT URINALYSIS DIP (DEVICE) - Abnormal; Notable for the following:    Glucose, UA >=1000 (*)    Hgb urine dipstick TRACE (*)    Leukocytes, UA SMALL (*)    All other components within normal limits    Imaging Review No results found.   MDM   1. Essential hypertension   2. Hyperglycemia due to type 2 diabetes mellitus  No evidence of DKA, only hyperglycemia. Creat normal. Advised patient to stop smoking and informed him that his BP is also elevated.  Patient met with patient access coordinator while at clinic Given Rx for two months supply of metformin and placed on lisinopril 57m po QD. [facility] Stressed the importance of compliance and routine medical follow up.    JLutricia Feil PUtah11/18/15 1043

## 2014-03-14 NOTE — ED Notes (Addendum)
PT   Is  A diabetic     Who is   Out  Of  His meds     -he  States     His  bloodpressure  Has been elevated  Lately  But  Takes  No meds   For his bp   Pt     Has   No Pcp       And     States  He  Usually  Goes to the  Er

## 2014-08-06 ENCOUNTER — Emergency Department (INDEPENDENT_AMBULATORY_CARE_PROVIDER_SITE_OTHER)
Admission: EM | Admit: 2014-08-06 | Discharge: 2014-08-06 | Disposition: A | Discharge status: Home / Self Care | Payer: Self-pay | Source: Home / Self Care | Attending: Emergency Medicine | Admitting: Emergency Medicine

## 2014-08-06 ENCOUNTER — Encounter (HOSPITAL_COMMUNITY): Payer: Self-pay | Admitting: Emergency Medicine

## 2014-08-06 DIAGNOSIS — IMO0002 Reserved for concepts with insufficient information to code with codable children: Secondary | ICD-10-CM

## 2014-08-06 DIAGNOSIS — E1165 Type 2 diabetes mellitus with hyperglycemia: Secondary | ICD-10-CM

## 2014-08-06 DIAGNOSIS — I10 Essential (primary) hypertension: Secondary | ICD-10-CM

## 2014-08-06 DIAGNOSIS — G43009 Migraine without aura, not intractable, without status migrainosus: Secondary | ICD-10-CM

## 2014-08-06 LAB — POCT I-STAT, CHEM 8
BUN: 18 mg/dL (ref 6–23)
CHLORIDE: 95 mmol/L — AB (ref 96–112)
Calcium, Ion: 1.23 mmol/L (ref 1.12–1.23)
Creatinine, Ser: 1 mg/dL (ref 0.50–1.35)
Glucose, Bld: 367 mg/dL — ABNORMAL HIGH (ref 70–99)
HCT: 47 % (ref 39.0–52.0)
Hemoglobin: 16 g/dL (ref 13.0–17.0)
POTASSIUM: 4.4 mmol/L (ref 3.5–5.1)
SODIUM: 132 mmol/L — AB (ref 135–145)
TCO2: 24 mmol/L (ref 0–100)

## 2014-08-06 MED ORDER — KETOROLAC TROMETHAMINE 60 MG/2ML IM SOLN
60.0000 mg | Freq: Once | INTRAMUSCULAR | Status: AC
  Administered 2014-08-06: 60 mg via INTRAMUSCULAR

## 2014-08-06 MED ORDER — DIPHENHYDRAMINE HCL 50 MG/ML IJ SOLN
INTRAMUSCULAR | Status: AC
  Filled 2014-08-06: qty 1

## 2014-08-06 MED ORDER — TETRACAINE HCL 0.5 % OP SOLN
OPHTHALMIC | Status: AC
  Filled 2014-08-06: qty 2

## 2014-08-06 MED ORDER — METFORMIN HCL 1000 MG PO TABS: 1000.0000 mg | ORAL_TABLET | Freq: Two times a day (BID) | ORAL | Status: DC

## 2014-08-06 MED ORDER — TETRACAINE HCL 0.5 % OP SOLN
1.0000 [drp] | Freq: Once | OPHTHALMIC | Status: AC
  Administered 2014-08-06: 1 [drp] via OPHTHALMIC

## 2014-08-06 MED ORDER — METOCLOPRAMIDE HCL 5 MG/ML IJ SOLN
INTRAMUSCULAR | Status: AC
  Filled 2014-08-06: qty 2

## 2014-08-06 MED ORDER — KETOROLAC TROMETHAMINE 60 MG/2ML IM SOLN
INTRAMUSCULAR | Status: AC
  Filled 2014-08-06: qty 2

## 2014-08-06 MED ORDER — LISINOPRIL 20 MG PO TABS: 20.0000 mg | ORAL_TABLET | Freq: Every day | ORAL | Status: DC

## 2014-08-06 MED ORDER — DIPHENHYDRAMINE HCL 50 MG/ML IJ SOLN
25.0000 mg | Freq: Once | INTRAMUSCULAR | Status: AC
  Administered 2014-08-06: 25 mg via INTRAMUSCULAR

## 2014-08-06 MED ORDER — METOCLOPRAMIDE HCL 5 MG/ML IJ SOLN
10.0000 mg | Freq: Once | INTRAMUSCULAR | Status: AC
  Administered 2014-08-06: 10 mg via INTRAMUSCULAR

## 2014-08-06 NOTE — Discharge Instructions (Signed)
DASH Eating Plan °DASH stands for "Dietary Approaches to Stop Hypertension." The DASH eating plan is a healthy eating plan that has been shown to reduce high blood pressure (hypertension). Additional health benefits may include reducing the risk of type 2 diabetes mellitus, heart disease, and stroke. The DASH eating plan may also help with weight loss. °WHAT DO I NEED TO KNOW ABOUT THE DASH EATING PLAN? °For the DASH eating plan, you will follow these general guidelines: °· Choose foods with a percent daily value for sodium of less than 5% (as listed on the food label). °· Use salt-free seasonings or herbs instead of table salt or sea salt. °· Check with your health care provider or pharmacist before using salt substitutes. °· Eat lower-sodium products, often labeled as "lower sodium" or "no salt added." °· Eat fresh foods. °· Eat more vegetables, fruits, and low-fat dairy products. °· Choose whole grains. Look for the word "whole" as the first word in the ingredient list. °· Choose fish and skinless chicken or turkey more often than red meat. Limit fish, poultry, and meat to 6 oz (170 g) each day. °· Limit sweets, desserts, sugars, and sugary drinks. °· Choose heart-healthy fats. °· Limit cheese to 1 oz (28 g) per day. °· Eat more home-cooked food and less restaurant, buffet, and fast food. °· Limit fried foods. °· Cook foods using methods other than frying. °· Limit canned vegetables. If you do use them, rinse them well to decrease the sodium. °· When eating at a restaurant, ask that your food be prepared with less salt, or no salt if possible. °WHAT FOODS CAN I EAT? °Seek help from a dietitian for individual calorie needs. °Grains °Whole grain or whole wheat bread. Brown rice. Whole grain or whole wheat pasta. Quinoa, bulgur, and whole grain cereals. Low-sodium cereals. Corn or whole wheat flour tortillas. Whole grain cornbread. Whole grain crackers. Low-sodium crackers. °Vegetables °Fresh or frozen vegetables  (raw, steamed, roasted, or grilled). Low-sodium or reduced-sodium tomato and vegetable juices. Low-sodium or reduced-sodium tomato sauce and paste. Low-sodium or reduced-sodium canned vegetables.  °Fruits °All fresh, canned (in natural juice), or frozen fruits. °Meat and Other Protein Products °Ground beef (85% or leaner), grass-fed beef, or beef trimmed of fat. Skinless chicken or turkey. Ground chicken or turkey. Pork trimmed of fat. All fish and seafood. Eggs. Dried beans, peas, or lentils. Unsalted nuts and seeds. Unsalted canned beans. °Dairy °Low-fat dairy products, such as skim or 1% milk, 2% or reduced-fat cheeses, low-fat ricotta or cottage cheese, or plain low-fat yogurt. Low-sodium or reduced-sodium cheeses. °Fats and Oils °Tub margarines without trans fats. Light or reduced-fat mayonnaise and salad dressings (reduced sodium). Avocado. Safflower, olive, or canola oils. Natural peanut or almond butter. °Other °Unsalted popcorn and pretzels. °The items listed above may not be a complete list of recommended foods or beverages. Contact your dietitian for more options. °WHAT FOODS ARE NOT RECOMMENDED? °Grains °White bread. White pasta. White rice. Refined cornbread. Bagels and croissants. Crackers that contain trans fat. °Vegetables °Creamed or fried vegetables. Vegetables in a cheese sauce. Regular canned vegetables. Regular canned tomato sauce and paste. Regular tomato and vegetable juices. °Fruits °Dried fruits. Canned fruit in light or heavy syrup. Fruit juice. °Meat and Other Protein Products °Fatty cuts of meat. Ribs, chicken wings, bacon, sausage, bologna, salami, chitterlings, fatback, hot dogs, bratwurst, and packaged luncheon meats. Salted nuts and seeds. Canned beans with salt. °Dairy °Whole or 2% milk, cream, half-and-half, and cream cheese. Whole-fat or sweetened yogurt. Full-fat   cheeses or blue cheese. Nondairy creamers and whipped toppings. Processed cheese, cheese spreads, or cheese  curds. Condiments Onion and garlic salt, seasoned salt, table salt, and sea salt. Canned and packaged gravies. Worcestershire sauce. Tartar sauce. Barbecue sauce. Teriyaki sauce. Soy sauce, including reduced sodium. Steak sauce. Fish sauce. Oyster sauce. Cocktail sauce. Horseradish. Ketchup and mustard. Meat flavorings and tenderizers. Bouillon cubes. Hot sauce. Tabasco sauce. Marinades. Taco seasonings. Relishes. Fats and Oils Butter, stick margarine, lard, shortening, ghee, and bacon fat. Coconut, palm kernel, or palm oils. Regular salad dressings. Other Pickles and olives. Salted popcorn and pretzels. The items listed above may not be a complete list of foods and beverages to avoid. Contact your dietitian for more information. WHERE CAN I FIND MORE INFORMATION? National Heart, Lung, and Blood Institute: travelstabloid.com Document Released: 04/02/2011 Document Revised: 08/28/2013 Document Reviewed: 02/15/2013 [facility] Patient Information 2015 [place], [place]. This information is not intended to replace advice given to you by your health care provider. Make sure you discuss any questions you have with your health care provider.  Diabetes and Standards of Medical Care Diabetes is complicated. You may find that your diabetes team includes a dietitian, nurse, diabetes educator, eye doctor, and more. To help everyone know what is going on and to help you get the care you deserve, the following schedule of care was developed to help keep you on track. Below are the tests, exams, vaccines, medicines, education, and plans you will need. HbA1c test This test shows how well you have controlled your glucose over the past 2-3 months. It is used to see if your diabetes management plan needs to be adjusted.   It is performed at least 2 times a year if you are meeting treatment goals.  It is performed 4 times a year if therapy has changed or if you are not meeting treatment  goals. Blood pressure test  This test is performed at every routine medical visit. The goal is less than 140/90 mm Hg for most people, but 130/80 mm Hg in some cases. Ask your health care provider about your goal. Dental exam  Follow up with the dentist regularly. Eye exam  If you are diagnosed with type 1 diabetes as a child, get an exam upon reaching the age of 65 years or older and have had diabetes for 3-5 years. Yearly eye exams are recommended after that initial eye exam.  If you are diagnosed with type 1 diabetes as an adult, get an exam within 5 years of diagnosis and then yearly.  If you are diagnosed with type 2 diabetes, get an exam as soon as possible after the diagnosis and then yearly. Foot care exam  Visual foot exams are performed at every routine medical visit. The exams check for cuts, injuries, or other problems with the feet.  A comprehensive foot exam should be done yearly. This includes visual inspection as well as assessing foot pulses and testing for loss of sensation.  Check your feet nightly for cuts, injuries, or other problems with your feet. Tell your health care provider if anything is not healing. Kidney function test (urine microalbumin)  This test is performed once a year.  Type 1 diabetes: The first test is performed 5 years after diagnosis.  Type 2 diabetes: The first test is performed at the time of diagnosis.  A serum creatinine and estimated glomerular filtration rate (eGFR) test is done once a year to assess the level of chronic kidney disease (CKD), if present. Lipid profile (cholesterol,  HDL, LDL, triglycerides)  Performed every 5 years for most people.  The goal for LDL is less than 100 mg/dL. If you are at high risk, the goal is less than 70 mg/dL.  The goal for HDL is 40 mg/dL-50 mg/dL for men and 50 mg/dL-60 mg/dL for women. An HDL cholesterol of 60 mg/dL or higher gives some protection against heart disease.  The goal for  triglycerides is less than 150 mg/dL. Influenza vaccine, pneumococcal vaccine, and hepatitis B vaccine  The influenza vaccine is recommended yearly.  It is recommended that people with diabetes who are over 44 years old get the pneumonia vaccine. In some cases, two separate shots may be given. Ask your health care provider if your pneumonia vaccination is up to date.  The hepatitis B vaccine is also recommended for adults with diabetes. Diabetes self-management education  Education is recommended at diagnosis and ongoing as needed. Treatment plan  Your treatment plan is reviewed at every medical visit. Document Released: 02/08/2009 Document Revised: 08/28/2013 Document Reviewed: 09/13/2012 [facility] Patient Information 2015 [place], [place]. This information is not intended to replace advice given to you by your health care provider. Make sure you discuss any questions you have with your health care provider.  Migraine Headache A migraine headache is an intense, throbbing pain on one or both sides of your head. A migraine can last for 30 minutes to several hours. CAUSES  The exact cause of a migraine headache is not always known. However, a migraine may be caused when nerves in the brain become irritated and release chemicals that cause inflammation. This causes pain. Certain things may also trigger migraines, such as:  Alcohol.  Smoking.  Stress.  Menstruation.  Aged cheeses.  Foods or drinks that contain nitrates, glutamate, aspartame, or tyramine.  Lack of sleep.  Chocolate.  Caffeine.  Hunger.  Physical exertion.  Fatigue.  Medicines used to treat chest pain (nitroglycerine), birth control pills, estrogen, and some blood pressure medicines. SIGNS AND SYMPTOMS  Pain on one or both sides of your head.  Pulsating or throbbing pain.  Severe pain that prevents daily activities.  Pain that is aggravated by any physical activity.  Nausea, vomiting, or  both.  Dizziness.  Pain with exposure to bright lights, loud noises, or activity.  General sensitivity to bright lights, loud noises, or smells. Before you get a migraine, you may get warning signs that a migraine is coming (aura). An aura may include:  Seeing flashing lights.  Seeing bright spots, halos, or zigzag lines.  Having tunnel vision or blurred vision.  Having feelings of numbness or tingling.  Having trouble talking.  Having muscle weakness. DIAGNOSIS  A migraine headache is often diagnosed based on:  Symptoms.  Physical exam.  A CT scan or MRI of your head. These imaging tests cannot diagnose migraines, but they can help rule out other causes of headaches. TREATMENT Medicines may be given for pain and nausea. Medicines can also be given to help prevent recurrent migraines.  HOME CARE INSTRUCTIONS  Only take over-the-counter or prescription medicines for pain or discomfort as directed by your health care provider. The use of long-term narcotics is not recommended.  Lie down in a dark, quiet room when you have a migraine.  Keep a journal to find out what may trigger your migraine headaches. For example, write down:  What you eat and drink.  How much sleep you get.  Any change to your diet or medicines.  Limit alcohol consumption.  Quit  smoking if you smoke.  Get 7-9 hours of sleep, or as recommended by your health care provider.  Limit stress.  Keep lights dim if bright lights bother you and make your migraines worse. SEEK IMMEDIATE MEDICAL CARE IF:   Your migraine becomes severe.  You have a fever.  You have a stiff neck.  You have vision loss.  You have muscular weakness or loss of muscle control.  You start losing your balance or have trouble walking.  You feel faint or pass out.  You have severe symptoms that are different from your first symptoms. MAKE SURE YOU:   Understand these instructions.  Will watch your  condition.  Will get help right away if you are not doing well or get worse. Document Released: 04/13/2005 Document Revised: 08/28/2013 Document Reviewed: 12/19/2012 [facility] Patient Information 2015 [place], [place]. This information is not intended to replace advice given to you by your health care provider. Make sure you discuss any questions you have with your health care provider.

## 2014-08-06 NOTE — ED Notes (Signed)
Patient c/o persistent headaches onset yesterday. Sensitive to light and sound. Patient also reports he has been out of his hypertension medication x 1 week now. Patient is in NAD.

## 2014-08-06 NOTE — ED Provider Notes (Signed)
CSN: [id number]     Arrival date & time 08/06/14  1029 History   First MD Initiated Contact with Patient 08/06/14 1115     Chief Complaint  Patient presents with  . Headache  . Medication Refill   (Consider location/radiation/quality/duration/timing/severity/associated sxs/prior Treatment) Patient is a 39 y.o. male presenting with headaches.  Headache Associated symptoms: photophobia          39 year old male with history of hypertension and diabetes, and chronic right-sided migraine headaches, presents complaining of a right-sided migraine headache as well as having run out of his medications. Urine out of all his medications 2 weeks ago. He has not checked his blood sugar any since then. No polyuria or polydipsia. He does not have a primary care provider. He normally gets a migraine when his blood sugar and blood pressure high. This migraine headache has been intermittent for the past 2 weeks. He also has some pain in his right eye. No vomiting or photophobia. He has a history of a right-sided cataract but has been unable to get this fixed due to no insurance   Past Medical History  Diagnosis Date  . Diabetes mellitus   . Hypertension   . Priapism   . Tobacco use    Past Surgical History  Procedure Laterality Date  . Penectomy    . Incision and drainage perirectal abscess  07/28/2011    Procedure: IRRIGATION AND DEBRIDEMENT PERIRECTAL ABSCESS;  Surgeon: Adin Hector, MD;  Location: [facility];  Service: General;  Laterality: N/A;   of skin muscle and subcutaneous tissue of perimeum  8cmx12cm area    No family history on file. History  Substance Use Topics  . Smoking status: Current Some Day Smoker -- 0.50 packs/day for 8 years    Types: Cigarettes  . Smokeless tobacco: Never Used  . Alcohol Use: No    Review of Systems  HENT:       Phonophobia  Eyes: Positive for photophobia and visual disturbance.  Neurological: Positive for headaches.  All other systems reviewed and are  negative.   Allergies  Review of patient's allergies indicates no known allergies.  Home Medications   Prior to Admission medications   Medication Sig Start Date End Date Taking? Authorizing Provider  famotidine (PEPCID) 20 MG tablet Take 1 tablet (20 mg total) by mouth 2 (two) times daily. 11/09/13   Robyn M Hess, PA-C  lisinopril (PRINIVIL,ZESTRIL) 20 MG tablet Take 1 tablet (20 mg total) by mouth daily. 08/06/14   Liam Graham, PA-C  metFORMIN (GLUCOPHAGE) 1000 MG tablet Take 1 tablet (1,000 mg total) by mouth 2 (two) times daily. 08/06/14   Liam Graham, PA-C  metFORMIN (GLUCOPHAGE) 500 MG tablet Take 2 tablets (1,000 mg total) by mouth 2 (two) times daily with a meal. 03/14/14   Annett Gula H Presson, PA   BP 179/95 mmHg  Pulse 74  Temp(Src) 98 F (36.7 C) (Oral)  Resp 18  SpO2 98% Physical Exam  Constitutional: He is oriented to person, place, and time. He appears well-developed and well-nourished. No distress.  HENT:  Head: Normocephalic.  Eyes: Conjunctivae and EOM are normal. Pupils are equal, round, and reactive to light. Right eye exhibits discharge (tearing). Left eye exhibits no discharge.  Hazy cornea consistent with a cataract on the right  Neck: Normal range of motion.  Cardiovascular: Normal rate, regular rhythm and normal heart sounds.   Pulmonary/Chest: Effort normal and breath sounds normal. No respiratory distress.  Neurological: He is alert  and oriented to person, place, and time. Coordination normal.  Skin: Skin is warm and dry. No rash noted. He is not diaphoretic.  Psychiatric: He has a normal mood and affect. Judgment normal.  Nursing note and vitals reviewed.  Intraocular pressure on the right is 11  ED Course  Procedures (including critical care time) Labs Review Labs Reviewed  POCT I-STAT, CHEM 8 - Abnormal; Notable for the following:    Sodium 132 (*)    Chloride 95 (*)    Glucose, Bld 367 (*)    All other components within normal limits     Imaging Review No results found.   MDM   1. Diabetes type 2, uncontrolled   2. Essential hypertension   3. Migraine without aura and without status migrainosus, not intractable    Anion gap is slightly elevated at 13, but he has no vomiting and is not feeling sick. He should have no problem with oral rehydration.   Corrected serum sodium was 138 which is within normal limits  He is feeling much better after the headache cocktail. I will refill his medicines for blood pressure and diabetes. I discussed with primary care follow-up, he will [facility] to schedule appointment. I have given him 3 months of his medications today and hopefully last him until he can get an appointment.  Meds ordered this encounter  Medications  . diphenhydrAMINE (BENADRYL) injection 25 mg    Sig:   . ketorolac (TORADOL) injection 60 mg    Sig:   . metoCLOPramide (REGLAN) injection 10 mg    Sig:   . tetracaine (PONTOCAINE) 0.5 % ophthalmic solution 1 drop    Sig:   . lisinopril (PRINIVIL,ZESTRIL) 20 MG tablet    Sig: Take 1 tablet (20 mg total) by mouth daily.    Dispense:  30 tablet    Refill:  2  . metFORMIN (GLUCOPHAGE) 1000 MG tablet    Sig: Take 1 tablet (1,000 mg total) by mouth 2 (two) times daily.    Dispense:  60 tablet    Refill:  [place] Drewey Begue, PA-C 08/06/14 1244

## 2014-12-31 ENCOUNTER — Inpatient Hospital Stay (HOSPITAL_COMMUNITY)
Admission: EM | Admit: 2014-12-31 | Discharge: 2015-01-03 | DRG: 066 | Disposition: A | Discharge status: Home / Self Care | Payer: Self-pay | Attending: Internal Medicine | Admitting: Internal Medicine

## 2014-12-31 ENCOUNTER — Encounter (HOSPITAL_COMMUNITY): Payer: Self-pay

## 2014-12-31 ENCOUNTER — Emergency Department (HOSPITAL_COMMUNITY): Payer: Self-pay

## 2014-12-31 DIAGNOSIS — Z79899 Other long term (current) drug therapy: Secondary | ICD-10-CM

## 2014-12-31 DIAGNOSIS — E1165 Type 2 diabetes mellitus with hyperglycemia: Secondary | ICD-10-CM | POA: Diagnosis present

## 2014-12-31 DIAGNOSIS — IMO0002 Reserved for concepts with insufficient information to code with codable children: Secondary | ICD-10-CM | POA: Diagnosis present

## 2014-12-31 DIAGNOSIS — E118 Type 2 diabetes mellitus with unspecified complications: Secondary | ICD-10-CM

## 2014-12-31 DIAGNOSIS — Z7982 Long term (current) use of aspirin: Secondary | ICD-10-CM

## 2014-12-31 DIAGNOSIS — F17200 Nicotine dependence, unspecified, uncomplicated: Secondary | ICD-10-CM | POA: Insufficient documentation

## 2014-12-31 DIAGNOSIS — F17210 Nicotine dependence, cigarettes, uncomplicated: Secondary | ICD-10-CM | POA: Diagnosis present

## 2014-12-31 DIAGNOSIS — I6930 Unspecified sequelae of cerebral infarction: Secondary | ICD-10-CM

## 2014-12-31 DIAGNOSIS — I639 Cerebral infarction, unspecified: Principal | ICD-10-CM | POA: Diagnosis present

## 2014-12-31 DIAGNOSIS — Z794 Long term (current) use of insulin: Secondary | ICD-10-CM

## 2014-12-31 DIAGNOSIS — I10 Essential (primary) hypertension: Secondary | ICD-10-CM | POA: Diagnosis present

## 2014-12-31 HISTORY — DX: Unspecified sequelae of cerebral infarction: I69.30

## 2014-12-31 LAB — I-STAT CHEM 8, ED
BUN: 10 mg/dL (ref 6–20)
CALCIUM ION: 1.17 mmol/L (ref 1.12–1.23)
Chloride: 98 mmol/L — ABNORMAL LOW (ref 101–111)
Creatinine, Ser: 1.1 mg/dL (ref 0.61–1.24)
GLUCOSE: 192 mg/dL — AB (ref 65–99)
HCT: 41 % (ref 39.0–52.0)
HEMOGLOBIN: 13.9 g/dL (ref 13.0–17.0)
Potassium: 3.6 mmol/L (ref 3.5–5.1)
Sodium: 137 mmol/L (ref 135–145)
TCO2: 24 mmol/L (ref 0–100)

## 2014-12-31 LAB — DIFFERENTIAL
BASOS ABS: 0 10*3/uL (ref 0.0–0.1)
BASOS PCT: 0 % (ref 0–1)
EOS ABS: 0.2 10*3/uL (ref 0.0–0.7)
Eosinophils Relative: 3 % (ref 0–5)
Lymphocytes Relative: 36 % (ref 12–46)
Lymphs Abs: 2.9 10*3/uL (ref 0.7–4.0)
Monocytes Absolute: 0.4 10*3/uL (ref 0.1–1.0)
Monocytes Relative: 5 % (ref 3–12)
NEUTROS ABS: 4.5 10*3/uL (ref 1.7–7.7)
NEUTROS PCT: 56 % (ref 43–77)

## 2014-12-31 LAB — CBC
HCT: 36.1 % — ABNORMAL LOW (ref 39.0–52.0)
Hemoglobin: 11.6 g/dL — ABNORMAL LOW (ref 13.0–17.0)
MCH: 19.8 pg — ABNORMAL LOW (ref 26.0–34.0)
MCHC: 32.1 g/dL (ref 30.0–36.0)
MCV: 61.7 fL — ABNORMAL LOW (ref 78.0–100.0)
Platelets: 172 10*3/uL (ref 150–400)
RBC: 5.85 MIL/uL — ABNORMAL HIGH (ref 4.22–5.81)
RDW: 15.9 % — AB (ref 11.5–15.5)
WBC: 8 10*3/uL (ref 4.0–10.5)

## 2014-12-31 LAB — COMPREHENSIVE METABOLIC PANEL
ALK PHOS: 71 U/L (ref 38–126)
ALT: 16 U/L — ABNORMAL LOW (ref 17–63)
AST: 21 U/L (ref 15–41)
Albumin: 4.1 g/dL (ref 3.5–5.0)
Anion gap: 11 (ref 5–15)
BUN: 9 mg/dL (ref 6–20)
CALCIUM: 9.2 mg/dL (ref 8.9–10.3)
CHLORIDE: 98 mmol/L — AB (ref 101–111)
CO2: 26 mmol/L (ref 22–32)
CREATININE: 1.08 mg/dL (ref 0.61–1.24)
GFR calc non Af Amer: >60 mL/min (ref >60)
Glucose, Bld: 193 mg/dL — ABNORMAL HIGH (ref 65–99)
Potassium: 3.6 mmol/L (ref 3.5–5.1)
SODIUM: 135 mmol/L (ref 135–145)
Total Bilirubin: 0.5 mg/dL (ref 0.3–1.2)
Total Protein: 6.7 g/dL (ref 6.5–8.1)

## 2014-12-31 LAB — PROTIME-INR
INR: 0.99 (ref 0.00–1.49)
PROTHROMBIN TIME: 13.3 s (ref 11.6–15.2)

## 2014-12-31 LAB — APTT: APTT: 27 s (ref 24–37)

## 2014-12-31 LAB — I-STAT TROPONIN, ED: TROPONIN I, POC: 0.03 ng/mL (ref 0.00–0.08)

## 2014-12-31 NOTE — ED Provider Notes (Signed)
CSN: [id number]     Arrival date & time 12/31/14  2202 History  This chart was scribed for Julianne Rice, MD by Irene Pap, ED Scribe. This patient was seen in room D33C/D33C and patient care was started at 11:22 PM.     Chief Complaint  Patient presents with  . Numbness   The history is provided by the patient. No language interpreter was used.   HPI Comments: Frank Schneider is a 39 y.o. male with a hx of HTN, DM, and priapism who presents to the Emergency Department complaining of left sided numbness and tingling noticed 11 hours ago upon waking. Last seen normal at 23 hours prior to going to sleep. He states that his blood sugar has been pretty high, was 230 PTA and his A1C level was 10. Reports associated weakness in the left lower extremity and slurred speech. States that he was at a cookout earlier and someone noticed that his face looked swollen. He states that the slurred speech started when the numbness started and states that the weakness makes it hard to ambulate without feeling like his leg is giving out beneath him. Denies hx of similar symptoms, neck pain, or blurred vision.   Past Medical History  Diagnosis Date  . Diabetes mellitus   . Hypertension   . Priapism   . Tobacco use    Past Surgical History  Procedure Laterality Date  . Penectomy    . Incision and drainage perirectal abscess  07/28/2011    Procedure: IRRIGATION AND DEBRIDEMENT PERIRECTAL ABSCESS;  Surgeon: Adin Hector, MD;  Location: [facility];  Service: General;  Laterality: N/A;   of skin muscle and subcutaneous tissue of perimeum  8cmx12cm area    No family history on file. Social History  Substance Use Topics  . Smoking status: Current Some Day Smoker -- 0.50 packs/day for 8 years    Types: Cigarettes  . Smokeless tobacco: Never Used  . Alcohol Use: No    Review of Systems  Constitutional: Negative for fever and chills.  Eyes: Negative for visual disturbance.  Respiratory: Negative for  shortness of breath.   Cardiovascular: Negative for chest pain.  Gastrointestinal: Negative for nausea, vomiting, abdominal pain and diarrhea.  Musculoskeletal: Negative for back pain and neck pain.  Skin: Negative for rash.  Neurological: Positive for speech difficulty, weakness and numbness. Negative for dizziness, light-headedness and headaches.  All other systems reviewed and are negative.     Allergies  Review of patient's allergies indicates no known allergies.  Home Medications   Prior to Admission medications   Medication Sig Start Date End Date Taking? Authorizing Provider  diphenhydramine-acetaminophen (TYLENOL PM) 25-500 MG TABS Take 2 tablets by mouth at bedtime as needed (sleep/pain).   Yes Historical Provider, MD  lisinopril (PRINIVIL,ZESTRIL) 20 MG tablet Take 1 tablet (20 mg total) by mouth daily. 08/06/14  Yes Liam Graham, PA-C  metFORMIN (GLUCOPHAGE) 1000 MG tablet Take 1 tablet (1,000 mg total) by mouth 2 (two) times daily. 08/06/14  Yes Liam Graham, PA-C  research study medication in dextrose 5 % 50 mL Take 1 tablet by mouth daily. Empagliflozin 10mg  or matching placebo   Yes Historical Provider, MD   BP 127/78 mmHg  Pulse 54  Temp(Src) 98.4 F (36.9 C) (Oral)  Resp 18  Ht 5\' 10"  (1.778 m)  Wt 201 lb 8 oz (91.4 kg)  BMI 28.91 kg/m2  SpO2 98% Physical Exam  Constitutional: He is oriented to person, place, and time.  He appears well-developed and well-nourished. No distress.  HENT:  Head: Normocephalic and atraumatic.  Mouth/Throat: Oropharynx is clear and moist.  Eyes: EOM are normal.  Right pupil is opaque due to known cataract  Neck: Normal range of motion. Neck supple.  No meningismus or tenderness to palpation.  Cardiovascular: Normal rate and regular rhythm.   Pulmonary/Chest: Effort normal and breath sounds normal. No respiratory distress. He has no wheezes. He has no rales. He exhibits no tenderness.  Abdominal: Soft. Bowel sounds are normal.  He exhibits no distension and no mass. There is no tenderness. There is no rebound and no guarding.  Musculoskeletal: Normal range of motion. He exhibits no edema or tenderness.  Neurological: He is alert and oriented to person, place, and time.  Patient is alert and oriented x3 with clear, goal oriented speech. Patient has 5/5 motor in all extremities. Sensation to light touch is decreased in the left face, trunk upper extremity and lower extremity. Bilateral finger-to-nose is normal with no signs of dysmetria. No facial droop noted.   Skin: Skin is warm and dry. No rash noted. No erythema.  Psychiatric: He has a normal mood and affect. His behavior is normal.  Nursing note and vitals reviewed.   ED Course  Procedures (including critical care time) DIAGNOSTIC STUDIES: Oxygen Saturation is 97% on RA, normal by my interpretation.    COORDINATION OF CARE: 11:27 PM-Discussed treatment plan which includes CT scan, labs, and call to neurologist with pt at bedside and pt agreed to plan.   Labs Review Labs Reviewed  CBC - Abnormal; Notable for the following:    RBC 5.85 (*)    Hemoglobin 11.6 (*)    HCT 36.1 (*)    MCV 61.7 (*)    MCH 19.8 (*)    RDW 15.9 (*)    All other components within normal limits  COMPREHENSIVE METABOLIC PANEL - Abnormal; Notable for the following:    Chloride 98 (*)    Glucose, Bld 193 (*)    ALT 16 (*)    All other components within normal limits  I-STAT CHEM 8, ED - Abnormal; Notable for the following:    Chloride 98 (*)    Glucose, Bld 192 (*)    All other components within normal limits  PROTIME-INR  APTT  DIFFERENTIAL  I-STAT TROPOININ, ED   Imaging Review Ct Head Wo Contrast  01/01/2015   CLINICAL DATA:  39 year old male with left-sided numbness.  EXAM: CT HEAD WITHOUT CONTRAST  TECHNIQUE: Contiguous axial images were obtained from the base of the skull through the vertex without intravenous contrast.  COMPARISON:  CT dated 08/07/2012  FINDINGS: The  ventricles and the sulci are appropriate in size for the patient's age. There is no intracranial hemorrhage. No midline shift or mass effect identified. The gray-white matter differentiation is preserved.  Mild mucoperiosteal thickening of the maxillary sinuses and ethmoids air cells with multiple left maxillary sinus retention cyst/polyps. The remainder of the visualized paranasal sinuses and mastoid air cells are clear. The calvarium is intact.  IMPRESSION: No acute intracranial pathology.   Electronically Signed   By: Anner Crete M.D.   On: 01/01/2015 00:57   Mr Brain Wo Contrast  01/01/2015   CLINICAL DATA:  Initial evaluation for acute onset left-sided numbness.  EXAM: MRI HEAD WITHOUT CONTRAST  TECHNIQUE: Multiplanar, multiecho pulse sequences of the brain and surrounding structures were obtained without intravenous contrast.  COMPARISON:  Prior CT from 12/31/2014.  FINDINGS: Cerebral volume within normal  limits for patient age. No significant white matter disease.  There is an acute ischemic infarct measuring approximately 7 x 7 x 11 mm present within the lateral right thalamus (series 3, image 21). Slight inferior extension towards the right cerebral peduncle. No associated hemorrhage or mass effect. No other ischemic infarct. Normal intravascular flow voids maintained. No acute or chronic intracranial hemorrhage.  No mass lesion, midline shift, or mass effect. No hydrocephalus. No extra-axial fluid collection.  Craniocervical junction within normal limits. Visualized upper cervical spine unremarkable. Pituitary gland normal.  No acute abnormality about the orbits.  Scattered mucosal thickening within the ethmoidal air cells, left sphenoid sinus, and maxillary sinuses. Small retention cyst present within the maxillary sinuses. No mastoid effusion. Inner ear structures grossly normal.  Bone marrow signal intensity within normal limits. Scalp soft tissues unremarkable.  IMPRESSION: 1. Acute 7 x 7 x 11  mm ischemic infarct within the right thalamus. No associated hemorrhage or significant mass effect. 2. No other acute intracranial process identified.   Electronically Signed   By: Jeannine Boga M.D.   On: 01/01/2015 03:45    EKG Interpretation   Date/Time:  Monday December 31 2014 22:10:45 EDT Ventricular Rate:  85 PR Interval:  152 QRS Duration: 90 QT Interval:  354 QTC Calculation: 421 R Axis:   28 Text Interpretation:  Normal sinus rhythm Possible Left atrial enlargement  Borderline ECG Confirmed by Lita Mains  MD, Daquan Crapps ([id number]) on 12/31/2014  11:36:46 PM      MDM   Final diagnoses:  CVA (cerebral vascular accident)   I personally performed the services described in this documentation, which was scribed in my presence. The recorded information has been reviewed and is accurate.  CT head without any acute findings. Discussed with Dr. Doy Mince at neurology. Recommends MRI in the emergency department and she will evaluate.   Discussed with Dr. Fabio Neighbors. Will admit to telemetry bed.  Julianne Rice, MD 01/01/15 [phone]

## 2014-12-31 NOTE — ED Notes (Addendum)
Pt reports to the ED for eval of left sided numbness. Reports his face, arms, and legs are all numb on the left side. First noted these symptoms was when he awoke this am. LSN last night before he went to bed. Pt reports he has been having URI symptoms since Thursday. No facial droop, grips equal, no arm drift. Pt was recently started on a trial medication for DM and HTN. He started it at the beginning of this month. Pt denies any HA. Pt A&Ox4, resp e/u, and skin warm and dry.

## 2015-01-01 ENCOUNTER — Inpatient Hospital Stay (HOSPITAL_COMMUNITY): Payer: Self-pay

## 2015-01-01 ENCOUNTER — Emergency Department (HOSPITAL_COMMUNITY): Payer: Self-pay

## 2015-01-01 DIAGNOSIS — I6789 Other cerebrovascular disease: Secondary | ICD-10-CM

## 2015-01-01 DIAGNOSIS — IMO0002 Reserved for concepts with insufficient information to code with codable children: Secondary | ICD-10-CM | POA: Diagnosis present

## 2015-01-01 DIAGNOSIS — F17200 Nicotine dependence, unspecified, uncomplicated: Secondary | ICD-10-CM | POA: Insufficient documentation

## 2015-01-01 DIAGNOSIS — I639 Cerebral infarction, unspecified: Secondary | ICD-10-CM | POA: Insufficient documentation

## 2015-01-01 DIAGNOSIS — E1165 Type 2 diabetes mellitus with hyperglycemia: Secondary | ICD-10-CM | POA: Diagnosis present

## 2015-01-01 DIAGNOSIS — Z720 Tobacco use: Secondary | ICD-10-CM

## 2015-01-01 DIAGNOSIS — I10 Essential (primary) hypertension: Secondary | ICD-10-CM | POA: Insufficient documentation

## 2015-01-01 DIAGNOSIS — E118 Type 2 diabetes mellitus with unspecified complications: Secondary | ICD-10-CM

## 2015-01-01 LAB — GLUCOSE, CAPILLARY
GLUCOSE-CAPILLARY: 174 mg/dL — AB (ref 65–99)
GLUCOSE-CAPILLARY: 260 mg/dL — AB (ref 65–99)
Glucose-Capillary: 164 mg/dL — ABNORMAL HIGH (ref 65–99)
Glucose-Capillary: 173 mg/dL — ABNORMAL HIGH (ref 65–99)

## 2015-01-01 LAB — LIPID PANEL
CHOL/HDL RATIO: 4 ratio
CHOLESTEROL: 108 mg/dL (ref 0–200)
HDL: 27 mg/dL — AB (ref >40)
LDL Cholesterol: 27 mg/dL (ref 0–99)
Triglycerides: 268 mg/dL — ABNORMAL HIGH (ref <150)
VLDL: 54 mg/dL — ABNORMAL HIGH (ref 0–40)

## 2015-01-01 MED ORDER — INSULIN ASPART 100 UNIT/ML ~~LOC~~ SOLN
0.0000 [IU] | Freq: Three times a day (TID) | SUBCUTANEOUS | Status: DC
  Administered 2015-01-01: 3 [IU] via SUBCUTANEOUS
  Administered 2015-01-01: 8 [IU] via SUBCUTANEOUS
  Administered 2015-01-01 – 2015-01-02 (×4): 3 [IU] via SUBCUTANEOUS
  Administered 2015-01-03: 5 [IU] via SUBCUTANEOUS
  Administered 2015-01-03: 2 [IU] via SUBCUTANEOUS

## 2015-01-01 MED ORDER — ASPIRIN 325 MG PO TABS
325.0000 mg | ORAL_TABLET | Freq: Every day | ORAL | Status: DC
  Administered 2015-01-01 – 2015-01-03 (×3): 325 mg via ORAL
  Filled 2015-01-01 (×3): qty 1

## 2015-01-01 MED ORDER — STROKE: EARLY STAGES OF RECOVERY BOOK
Freq: Once | Status: DC
  Filled 2015-01-01: qty 1

## 2015-01-01 MED ORDER — ENOXAPARIN SODIUM 40 MG/0.4ML ~~LOC~~ SOLN
40.0000 mg | SUBCUTANEOUS | Status: DC
  Administered 2015-01-01 – 2015-01-03 (×3): 40 mg via SUBCUTANEOUS
  Filled 2015-01-01 (×3): qty 0.4

## 2015-01-01 MED ORDER — INSULIN GLARGINE 100 UNIT/ML ~~LOC~~ SOLN
16.0000 [IU] | Freq: Every day | SUBCUTANEOUS | Status: DC
  Administered 2015-01-01 – 2015-01-02 (×2): 16 [IU] via SUBCUTANEOUS
  Filled 2015-01-01 (×3): qty 0.16

## 2015-01-01 MED ORDER — INSULIN GLARGINE 100 UNIT/ML ~~LOC~~ SOLN
10.0000 [IU] | Freq: Every day | SUBCUTANEOUS | Status: DC
Start: 2015-01-01 — End: 2015-01-01
  Filled 2015-01-01: qty 0.1

## 2015-01-01 NOTE — Progress Notes (Signed)
STROKE TEAM PROGRESS NOTE   SUBJECTIVE (INTERVAL HISTORY) No family is at the bedside.  Overall he feels his condition is stable, still has left sided numbness. He admits he does not have a medical doctor for followup of diabetes.   OBJECTIVE Temp:  [97.8 F (36.6 C)-98.4 F (36.9 C)] 98.4 F (36.9 C) (09/06 0907) Pulse Rate:  [50-93] 64 (09/06 0907) Cardiac Rhythm:  [-] Normal sinus rhythm (09/06 0907) Resp:  [13-24] 18 (09/06 0907) BP: (112-144)/(61-94) 135/79 mmHg (09/06 0907) SpO2:  [96 %-100 %] 100 % (09/06 0907) Weight:  [91.4 kg (201 lb 8 oz)] 91.4 kg (201 lb 8 oz) (09/05 2208)  CBC:   Recent Labs Lab 12/31/14 2237 12/31/14 2241  WBC 8.0  --   NEUTROABS 4.5  --   HGB 11.6* 13.9  HCT 36.1* 41.0  MCV 61.7*  --   PLT 172  --     Basic Metabolic Panel:   Recent Labs Lab 12/31/14 2237 12/31/14 2241  NA 135 137  K 3.6 3.6  CL 98* 98*  CO2 26  --   GLUCOSE 193* 192*  BUN 9 10  CREATININE 1.08 1.10  CALCIUM 9.2  --     Lipid Panel:     Component Value Date/Time   CHOL 108 01/01/2015 0625   TRIG 268* 01/01/2015 0625   HDL 27* 01/01/2015 0625   CHOLHDL 4.0 01/01/2015 0625   VLDL 54* 01/01/2015 0625   LDLCALC 27 01/01/2015 0625   HgbA1c:  Lab Results  Component Value Date   HGBA1C 11.3* 07/28/2011   Urine Drug Screen:     Component Value Date/Time   LABOPIA NONE DETECTED 07/28/2011 1613   COCAINSCRNUR NONE DETECTED 07/28/2011 1613   LABBENZ NONE DETECTED 07/28/2011 1613   AMPHETMU NONE DETECTED 07/28/2011 1613   THCU POSITIVE* 07/28/2011 1613   LABBARB NONE DETECTED 07/28/2011 1613      IMAGING  I have personally reviewed the radiological images below and agree with the radiology interpretations.  Ct Head Wo Contrast 01/01/2015   No acute intracranial pathology.      Mr Brain Wo Contrast 01/01/2015   1. Acute 7 x 7 x 11 mm ischemic infarct within the right thalamus. No associated hemorrhage or significant mass effect. 2. No other acute  intracranial process identified.    CTA head and neck - pending  2D echo - - Left ventricle: The cavity size was normal. Wall thickness was increased in a pattern of moderate LVH. Systolic function was normal. The estimated ejection fraction was in the range of 60% to 65%. Wall motion was normal; there were no regional wall motion abnormalities. Left ventricular diastolic function parameters were normal. - Mitral valve: There was mild regurgitation. Impressions: - No cardiac source of emboli was indentified.  PHYSICAL EXAM  Temp:  [97.8 F (36.6 C)-98.6 F (37 C)] 98.3 F (36.8 C) (09/06 1718) Pulse Rate:  [50-93] 63 (09/06 1718) Resp:  [13-24] 20 (09/06 1718) BP: (112-156)/(57-94) 156/79 mmHg (09/06 1718) SpO2:  [96 %-100 %] 100 % (09/06 1718) Weight:  [201 lb 8 oz (91.4 kg)] 201 lb 8 oz (91.4 kg) (09/05 2208)  General - Well nourished, well developed, in no apparent distress.  Ophthalmologic - Sharp disc margins OU.   Cardiovascular - Regular rate and rhythm with no murmur.  Mental Status -  Level of arousal and orientation to time, place, and person were intact. Language including expression, naming, repetition, comprehension was assessed and found intact. Fund of [employer]  was assessed and was intact.  Cranial Nerves II - XII - II - Visual field intact OU. III, IV, VI - Extraocular movements intact. V - Facial sensation decreased on the left. VII - Facial movement intact bilaterally. VIII - Hearing & vestibular intact bilaterally. X - Palate elevates symmetrically. XI - Chin turning & shoulder shrug intact bilaterally. XII - Tongue protrusion intact.  Motor Strength - The patient's strength was normal in all extremities except LUE 5-/5 proximally and pronator drift was present on the left.  Bulk was normal and fasciculations were absent.   Motor Tone - Muscle tone was assessed at the neck and appendages and was normal.  Reflexes - The patient's reflexes  were 1+ in all extremities and he had no pathological reflexes.  Sensory - Light touch, temperature/pinprick were assessed and were decreased on the left.    Coordination - The patient had normal movements in the hands and feet with no ataxia or dysmetria.  Tremor was absent.  Gait and Station - deferred    ASSESSMENT/PLAN Mr. Shawnta Zimbelman is a 39 y.o. male with history of diabetes and hypertension presenting with left sided numbness and weakness. He did not receive IV t-PA due to delay in arriva.   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 Continue ASA on discharge.  Patient counseled to be compliant with his antithrombotic medications  Consider for Stroke AF trial. Research coordinator to review chart  Ongoing aggressive stroke risk factor management  Therapy recommendations:  pending   Disposition:  pending   Hypertension  Stable Permissive hypertension (OK if < 220/120) but gradually normalize in 5-7 days  Diabetes type II, uncontrolled  HgbA1c pending , goal < 7.0  Recommend pt get a primary MD to further evaluate diabetes and treatment course  Uncontrolled  SSI  CBG monitoring  DM education  On lantus  Need close follow up with PCP  Tobacco abuse  Current smoker  Smoking cessation counseling provided  Pt is willing to quit  Other Stroke Risk Factors  Positive THC this admission  Other Active Problems  Headaches twice a week on average  On clinical trial for DM  Hospital day # 0  Rosalin Hawking, MD PhD Stroke Neurology 01/01/2015 7:06 PM     To contact Stroke Continuity provider, please  refer to [email or web address] After hours, contact General Neurology

## 2015-01-01 NOTE — Care Management Note (Addendum)
Case Management Note  Patient Details  Name: Randolf Sansoucie MRN: [id number] Date of Birth: 1976-02-13  Subjective/Objective:                    Action/Plan: Pt admitted with CVA. Pt from home with family.  Jasmine with financial counseling is following. Awaiting PT/OT recommendation. CM will continue to follow for discharge needs.   Expected Discharge Date:                  Expected Discharge Plan:  Home/Self Care  In-House Referral:     Discharge planning Services     Post Acute Care Choice:    Choice offered to:     DME Arranged:    DME Agency:     HH Arranged:    HH Agency:     Status of Service:  In process, will continue to follow  Medicare Important Message Given:    Date Medicare IM Given:    Medicare IM give by:    Date Additional Medicare IM Given:    Additional Medicare Important Message give by:     If discussed at [place] of Stay Meetings, dates discussed:    Additional Comments:  Ollen Gross, RN 01/01/2015, 11:28 AM

## 2015-01-01 NOTE — Consult Note (Signed)
Referring Physician: Lita Mains    Chief Complaint: Left sided numbness and weakness  HPI: Frank Schneider is an 39 y.o. male with a history of diabetes and hypertension who reports awakening yesterday feeling nunb and weak in the left side.  Feels as if someone just drew a line down the center of his body.  When his symptoms did not resolve he presented for evaluation.  Initial NIHSS of 1.    Date last known well: Date: 12/31/2014 Time last known well: Time: 00:30 tPA Given: No: Outside time window  Past Medical History  Diagnosis Date  . Diabetes mellitus   . Hypertension   . Priapism   . Tobacco use     Past Surgical History  Procedure Laterality Date  . Penectomy    . Incision and drainage perirectal abscess  07/28/2011    Procedure: IRRIGATION AND DEBRIDEMENT PERIRECTAL ABSCESS;  Surgeon: Adin Hector, MD;  Location: [facility];  Service: General;  Laterality: N/A;   of skin muscle and subcutaneous tissue of perimeum  8cmx12cm area    Family history: Mother, maternal grandmother and brothers with hypertension and diabetes.  No information about father's medical history.   Social History:  reports that he has been smoking Cigarettes.  He has a 4 pack-year smoking history. He has never used smokeless tobacco. He reports that he does not drink alcohol or use illicit drugs.  Allergies: No Known Allergies  Medications: I have reviewed the patient's current medications. Prior to Admission:  Prior to Admission medications   Medication Sig Start Date End Date Taking? Authorizing Provider  diphenhydramine-acetaminophen (TYLENOL PM) 25-500 MG TABS Take 2 tablets by mouth at bedtime as needed (sleep/pain).   Yes Historical Provider, MD  lisinopril (PRINIVIL,ZESTRIL) 20 MG tablet Take 1 tablet (20 mg total) by mouth daily. 08/06/14  Yes Liam Graham, PA-C  metFORMIN (GLUCOPHAGE) 1000 MG tablet Take 1 tablet (1,000 mg total) by mouth 2 (two) times daily. 08/06/14  Yes Liam Graham, PA-C   research study medication in dextrose 5 % 50 mL Take 1 tablet by mouth daily. Empagliflozin 10mg  or matching placebo   Yes Historical Provider, MD    ROS: History obtained from the patient  General ROS: negative for - chills, fatigue, fever, night sweats, weight gain or weight loss Psychological ROS: negative for - behavioral disorder, hallucinations, memory difficulties, mood swings or suicidal ideation Ophthalmic ROS: negative for - blurry vision, double vision, eye pain or loss of vision ENT ROS: negative for - epistaxis, nasal discharge, oral lesions, sore throat, tinnitus or vertigo Allergy and Immunology ROS: negative for - hives or itchy/watery eyes Hematological and Lymphatic ROS: negative for - bleeding problems, bruising or swollen lymph nodes Endocrine ROS: negative for - galactorrhea, hair pattern changes, polydipsia/polyuria or temperature intolerance Respiratory ROS: negative for - cough, hemoptysis, shortness of breath or wheezing Cardiovascular ROS: negative for - chest pain, dyspnea on exertion, edema or irregular heartbeat Gastrointestinal ROS: negative for - abdominal pain, diarrhea, hematemesis, nausea/vomiting or stool incontinence Genito-Urinary ROS: negative for - dysuria, hematuria, incontinence or urinary frequency/urgency Musculoskeletal ROS: negative for - joint swelling or muscular weakness Neurological ROS: as noted in HPI Dermatological ROS: negative for rash and skin lesion changes  Physical Examination: Blood pressure 127/78, pulse 54, temperature 98.4 F (36.9 C), temperature source Oral, resp. rate 18, height 5\' 10"  (1.778 m), weight 91.4 kg (201 lb 8 oz), SpO2 98 %.  Gen: NAD HEENT-  Normocephalic, no lesions, without obvious abnormality.  Normal  external eye and conjunctiva.  Normal TM's bilaterally.  Normal auditory canals and external ears. Normal external nose, mucus membranes and septum.  Normal pharynx. Cardiovascular- S1, S2 normal, pulses  palpable throughout   Lungs- chest clear, no wheezing, rales, normal symmetric air entry Abdomen- soft, non-tender; bowel sounds normal; no masses,  no organomegaly Extremities- no edema Lymph-no adenopathy palpable Musculoskeletal-no joint tenderness, deformity or swelling Skin-warm and dry, no hyperpigmentation, vitiligo, or suspicious lesions  Neurological Examination Mental Status: Alert, oriented, thought content appropriate.  Speech fluent without evidence of aphasia.  Able to follow 3 step commands without difficulty. Cranial Nerves: II: Disc flat left, unable to be visualized on the right; Visual fields grossly normal out of the right, Left pupil 65mm, round, reactive to light and accommodation.  Right pupil fixed at 73mm III,IV, VI: ptosis not present, extra-ocular motions intact bilaterally V,VII: smile symmetric, facial light touch sensation decreased on the left VIII: hearing normal bilaterally IX,X: gag reflex present XI: bilateral shoulder shrug XII: midline tongue extension Motor: Right : Upper extremity   5/5    Left:     Upper extremity   5/5  Lower extremity   5/5     Lower extremity   5-/5 Tone and bulk:normal tone throughout; no atrophy noted Sensory: Pinprick and light touch decreased on the left Deep Tendon Reflexes: 2+ and symmetric in the upper extremities, absent in the lower extremities Plantars: Right: downgoing   Left: upgoing Cerebellar: normal finger-to-nose and normal heel-to-shin testing bilaterally   Laboratory Studies:  Basic Metabolic Panel:  Recent Labs Lab 12/31/14 2237 12/31/14 2241  NA 135 137  K 3.6 3.6  CL 98* 98*  CO2 26  --   GLUCOSE 193* 192*  BUN 9 10  CREATININE 1.08 1.10  CALCIUM 9.2  --     Liver Function Tests:  Recent Labs Lab 12/31/14 2237  AST 21  ALT 16*  ALKPHOS 71  BILITOT 0.5  PROT 6.7  ALBUMIN 4.1   No results for input(s): LIPASE, AMYLASE in the last 168 hours. No results for input(s): AMMONIA in the  last 168 hours.  CBC:  Recent Labs Lab 12/31/14 2237 12/31/14 2241  WBC 8.0  --   NEUTROABS 4.5  --   HGB 11.6* 13.9  HCT 36.1* 41.0  MCV 61.7*  --   PLT 172  --     Cardiac Enzymes: No results for input(s): CKTOTAL, CKMB, CKMBINDEX, TROPONINI in the last 168 hours.  BNP: Invalid input(s): POCBNP  CBG: No results for input(s): GLUCAP in the last 168 hours.  Microbiology: Results for orders placed or performed during the hospital encounter of 01/06/12  Rapid strep screen     Status: None   Collection Time: 01/06/12  2:05 PM  Result Value Ref Range Status   Streptococcus, Group A Screen (Direct) NEGATIVE NEGATIVE Final    Comment:        DUE TO INADEQUATE SENSITIVITY OF EIA RAPID TESTS FOR GROUP A STREP (GAS) IT IS RECOMMENDED THAT ALL NEGATIVE RESULTS BE FOLLOWED BY A GROUP A STREP PROBE.    Coagulation Studies:  Recent Labs  12/31/14 2237  LABPROT 13.3  INR 0.99    Urinalysis: No results for input(s): COLORURINE, LABSPEC, PHURINE, GLUCOSEU, HGBUR, BILIRUBINUR, KETONESUR, PROTEINUR, UROBILINOGEN, NITRITE, LEUKOCYTESUR in the last 168 hours.  Invalid input(s): APPERANCEUR  Lipid Panel: No results found for: CHOL, TRIG, HDL, CHOLHDL, VLDL, LDLCALC  HgbA1C:  Lab Results  Component Value Date   HGBA1C 11.3* 07/28/2011  Urine Drug Screen:     Component Value Date/Time   LABOPIA NONE DETECTED 07/28/2011 1613   COCAINSCRNUR NONE DETECTED 07/28/2011 1613   LABBENZ NONE DETECTED 07/28/2011 1613   AMPHETMU NONE DETECTED 07/28/2011 1613   THCU POSITIVE* 07/28/2011 1613   LABBARB NONE DETECTED 07/28/2011 1613    Alcohol Level: No results for input(s): ETH in the last 168 hours.  Other results: EKG: normal sinus rhythm at 85 bpm.  Imaging: Ct Head Wo Contrast  01/01/2015   CLINICAL DATA:  39 year old male with left-sided numbness.  EXAM: CT HEAD WITHOUT CONTRAST  TECHNIQUE: Contiguous axial images were obtained from the base of the skull through the  vertex without intravenous contrast.  COMPARISON:  CT dated 08/07/2012  FINDINGS: The ventricles and the sulci are appropriate in size for the patient's age. There is no intracranial hemorrhage. No midline shift or mass effect identified. The gray-white matter differentiation is preserved.  Mild mucoperiosteal thickening of the maxillary sinuses and ethmoids air cells with multiple left maxillary sinus retention cyst/polyps. The remainder of the visualized paranasal sinuses and mastoid air cells are clear. The calvarium is intact.  IMPRESSION: No acute intracranial pathology.   Electronically Signed   By: Anner Crete M.D.   On: 01/01/2015 00:57   Mr Brain Wo Contrast  01/01/2015   CLINICAL DATA:  Initial evaluation for acute onset left-sided numbness.  EXAM: MRI HEAD WITHOUT CONTRAST  TECHNIQUE: Multiplanar, multiecho pulse sequences of the brain and surrounding structures were obtained without intravenous contrast.  COMPARISON:  Prior CT from 12/31/2014.  FINDINGS: Cerebral volume within normal limits for patient age. No significant white matter disease.  There is an acute ischemic infarct measuring approximately 7 x 7 x 11 mm present within the lateral right thalamus (series 3, image 21). Slight inferior extension towards the right cerebral peduncle. No associated hemorrhage or mass effect. No other ischemic infarct. Normal intravascular flow voids maintained. No acute or chronic intracranial hemorrhage.  No mass lesion, midline shift, or mass effect. No hydrocephalus. No extra-axial fluid collection.  Craniocervical junction within normal limits. Visualized upper cervical spine unremarkable. Pituitary gland normal.  No acute abnormality about the orbits.  Scattered mucosal thickening within the ethmoidal air cells, left sphenoid sinus, and maxillary sinuses. Small retention cyst present within the maxillary sinuses. No mastoid effusion. Inner ear structures grossly normal.  Bone marrow signal intensity  within normal limits. Scalp soft tissues unremarkable.  IMPRESSION: 1. Acute 7 x 7 x 11 mm ischemic infarct within the right thalamus. No associated hemorrhage or significant mass effect. 2. No other acute intracranial process identified.   Electronically Signed   By: Jeannine Boga M.D.   On: 01/01/2015 03:45    Assessment: 39 y.o. male presenting with left sided numbness and mild weakness.  Head CT personally reviewed and shows no acute changes. MRI of the brain performed due to risk factors and personally reviewed as well.  It shows a right acute thalamic infarct.  Patient on no antiplatelet therapy.  Not a tPA candidate due to being outside of the time window for treatment.    Stroke Risk Factors - diabetes mellitus, hypertension and smoking  Plan: 1. HgbA1c, fasting lipid panel 2. PT consult, OT consult, Speech consult 3. Echocardiogram 4. Carotid dopplers 5. Prophylactic therapy-Antiplatelet med: Aspirin - dose 325mg  daily 6. NPO until RN stroke swallow screen 7. Telemetry monitoring 8. Frequent neuro checks 9. Smoking cessation [place], MD [facility] [phone] 01/01/2015, 4:23 AM

## 2015-01-01 NOTE — Progress Notes (Signed)
  Echocardiogram 2D Echocardiogram has been performed.  Diamond Nickel 01/01/2015, 4:24 PM

## 2015-01-01 NOTE — H&P (Signed)
[facility] History and Physical  Frank Schneider DGU:[id number] DOB: October 20, 1975 DOA: 12/31/2014  Referring physician: EDP PCP: Default, Provider, MD   Chief Complaint: Left sided weakness   HPI: Frank Schneider is a 39 y.o. male with h/o DM2, poorly controlled with A1C of 10 or more for the past 8 years since diagnosis he says, HTN.  Patient reports waking up yesterday with feeling of numbness and weakness in his left side.  When his symptoms did not resolve he presented to the ED for evaluation later in the day.  Initial NIHSS of 1.  Unfortunately and surprisingly, despite his very young age, MRI brain does confirm acute infarct.  Review of Systems: Systems reviewed.  As above, otherwise negative  Past Medical History  Diagnosis Date  . Diabetes mellitus   . Hypertension   . Priapism   . Tobacco use    Past Surgical History  Procedure Laterality Date  . Penectomy    . Incision and drainage perirectal abscess  07/28/2011    Procedure: IRRIGATION AND DEBRIDEMENT PERIRECTAL ABSCESS;  Surgeon: Adin Hector, MD;  Location: [facility];  Service: General;  Laterality: N/A;   of skin muscle and subcutaneous tissue of perimeum  8cmx12cm area    Social History:  reports that he has been smoking Cigarettes.  He has a 4 pack-year smoking history. He has never used smokeless tobacco. He reports that he does not drink alcohol or use illicit drugs.  No Known Allergies  No family history on file.   Prior to Admission medications   Medication Sig Start Date End Date Taking? Authorizing Provider  diphenhydramine-acetaminophen (TYLENOL PM) 25-500 MG TABS Take 2 tablets by mouth at bedtime as needed (sleep/pain).   Yes Historical Provider, MD  lisinopril (PRINIVIL,ZESTRIL) 20 MG tablet Take 1 tablet (20 mg total) by mouth daily. 08/06/14  Yes Liam Graham, PA-C  metFORMIN (GLUCOPHAGE) 1000 MG tablet Take 1 tablet (1,000 mg total) by mouth 2 (two) times daily. 08/06/14  Yes Liam Graham,  PA-C  research study medication in dextrose 5 % 50 mL Take 1 tablet by mouth daily. Empagliflozin 10mg  or matching placebo   Yes Historical Provider, MD   Physical Exam: Filed Vitals:   01/01/15 0430  BP: 112/94  Pulse: 50  Temp:   Resp: 19    BP 112/94 mmHg  Pulse 50  Temp(Src) 97.8 F (36.6 C) (Oral)  Resp 19  Ht 5\' 10"  (1.778 m)  Wt 91.4 kg (201 lb 8 oz)  BMI 28.91 kg/m2  SpO2 100%  General Appearance:    Alert, oriented, no distress, appears stated age  Head:    Normocephalic, atraumatic  Eyes:    PERRL, EOMI, sclera non-icteric        Nose:   Nares without drainage or epistaxis. Mucosa, turbinates normal  Throat:   Moist mucous membranes. Oropharynx without erythema or exudate.  Neck:   Supple. No carotid bruits.  No thyromegaly.  No lymphadenopathy.   Back:     No CVA tenderness, no spinal tenderness  Lungs:     Clear to auscultation bilaterally, without wheezes, rhonchi or rales  Chest wall:    No tenderness to palpitation  Heart:    Regular rate and rhythm without murmurs, gallops, rubs  Abdomen:     Soft, non-tender, nondistended, normal bowel sounds, no organomegaly  Genitalia:    deferred  Rectal:    deferred  Extremities:   No clubbing, cyanosis or edema.  Pulses:   2+  and symmetric all extremities  Skin:   Skin color, texture, turgor normal, no rashes or lesions  Lymph nodes:   Cervical, supraclavicular, and axillary nodes normal  Neurologic:   Slight weakness in LLE compared to RLE.    Labs on Admission:  Basic Metabolic Panel:  Recent Labs Lab 12/31/14 2237 12/31/14 2241  NA 135 137  K 3.6 3.6  CL 98* 98*  CO2 26  --   GLUCOSE 193* 192*  BUN 9 10  CREATININE 1.08 1.10  CALCIUM 9.2  --    Liver Function Tests:  Recent Labs Lab 12/31/14 2237  AST 21  ALT 16*  ALKPHOS 71  BILITOT 0.5  PROT 6.7  ALBUMIN 4.1   No results for input(s): LIPASE, AMYLASE in the last 168 hours. No results for input(s): AMMONIA in the last 168  hours. CBC:  Recent Labs Lab 12/31/14 2237 12/31/14 2241  WBC 8.0  --   NEUTROABS 4.5  --   HGB 11.6* 13.9  HCT 36.1* 41.0  MCV 61.7*  --   PLT 172  --    Cardiac Enzymes: No results for input(s): CKTOTAL, CKMB, CKMBINDEX, TROPONINI in the last 168 hours.  BNP (last 3 results) No results for input(s): PROBNP in the last 8760 hours. CBG: No results for input(s): GLUCAP in the last 168 hours.  Radiological Exams on Admission: Ct Head Wo Contrast  01/01/2015   CLINICAL DATA:  39 year old male with left-sided numbness.  EXAM: CT HEAD WITHOUT CONTRAST  TECHNIQUE: Contiguous axial images were obtained from the base of the skull through the vertex without intravenous contrast.  COMPARISON:  CT dated 08/07/2012  FINDINGS: The ventricles and the sulci are appropriate in size for the patient's age. There is no intracranial hemorrhage. No midline shift or mass effect identified. The gray-white matter differentiation is preserved.  Mild mucoperiosteal thickening of the maxillary sinuses and ethmoids air cells with multiple left maxillary sinus retention cyst/polyps. The remainder of the visualized paranasal sinuses and mastoid air cells are clear. The calvarium is intact.  IMPRESSION: No acute intracranial pathology.   Electronically Signed   By: Anner Crete M.D.   On: 01/01/2015 00:57   Mr Brain Wo Contrast  01/01/2015   CLINICAL DATA:  Initial evaluation for acute onset left-sided numbness.  EXAM: MRI HEAD WITHOUT CONTRAST  TECHNIQUE: Multiplanar, multiecho pulse sequences of the brain and surrounding structures were obtained without intravenous contrast.  COMPARISON:  Prior CT from 12/31/2014.  FINDINGS: Cerebral volume within normal limits for patient age. No significant white matter disease.  There is an acute ischemic infarct measuring approximately 7 x 7 x 11 mm present within the lateral right thalamus (series 3, image 21). Slight inferior extension towards the right cerebral peduncle. No  associated hemorrhage or mass effect. No other ischemic infarct. Normal intravascular flow voids maintained. No acute or chronic intracranial hemorrhage.  No mass lesion, midline shift, or mass effect. No hydrocephalus. No extra-axial fluid collection.  Craniocervical junction within normal limits. Visualized upper cervical spine unremarkable. Pituitary gland normal.  No acute abnormality about the orbits.  Scattered mucosal thickening within the ethmoidal air cells, left sphenoid sinus, and maxillary sinuses. Small retention cyst present within the maxillary sinuses. No mastoid effusion. Inner ear structures grossly normal.  Bone marrow signal intensity within normal limits. Scalp soft tissues unremarkable.  IMPRESSION: 1. Acute 7 x 7 x 11 mm ischemic infarct within the right thalamus. No associated hemorrhage or significant mass effect. 2. No other acute intracranial  process identified.   Electronically Signed   By: Jeannine Boga M.D.   On: 01/01/2015 03:45    EKG: Independently reviewed.  Assessment/Plan Principal Problem:   CVA (cerebral vascular accident) Active Problems:   Diabetes mellitus type 2 with complications, uncontrolled   1. Acute ischemic stroke - shows right sided acute thalamic infarct causing the patients left sided symptoms 1. Stroke pathway and workup ordered 2. Holding lisinopril for permissive HTN (although BP in ED is under 342 systolic) 2. DM2 - uncontrolled, with complications -  1. Needs aggressive treatment of DM2.  Diabetes coordinator consult placed, holding metformin and study med (which was either placebo or empagliflozin, the stroke diagnosis probably represents a primary endpoint in the study anyhow). 2. lantus and SSI AC/HS 3. A1C pending     Code Status: Full Code  Family Communication: No family in room Disposition Plan: Admit to inpatient   Time spent: 70 min  GARDNER, JARED M. [facility] Pager [phone]  If 7AM-7PM, please contact  the day team taking care of the patient [email or web address] Password [email or web address] 01/01/2015, 4:57 AM

## 2015-01-01 NOTE — Progress Notes (Signed)
Utilization review completed. Sarea Fyfe, RN, BSN. 

## 2015-01-01 NOTE — ED Notes (Signed)
Dr. Lita Mains made aware of change to neuro check.

## 2015-01-01 NOTE — ED Notes (Signed)
Attempted to call report

## 2015-01-01 NOTE — Progress Notes (Signed)
  [place] TEAM 12  PROGRESS NOTE  Frank Schneider WIO:[id number] DOB: 1975/07/14 DOA: 12/31/2014 PCP: Default, Provider, MD  Admit HPI / Brief Narrative: 39 y.o. male with h/o DM2 with A1C of 10, and HTN who reported waking up with feeling of numbness and weakness in his left side. When his symptoms did not resolve he presented to the ED for evaluation.  In the ED MRI brain confirmed acute infarct.  HPI/Subjective: Pt seen for f/u visit.  Assessment/Plan:  Acute R thalamic ischemic stroke Neuro following - felt to be small vessel source - full workup underway   Uncontrolled DM2  HTN  Tobacco abuse   Code Status: FULL Family Communication: no family present at time of exam Disposition Plan: stroke w/u   Consultants: Neurology   Procedures: none  Antibiotics: none  DVT prophylaxis: lovenox  Objective: Blood pressure 152/84, pulse 57, temperature 98.5 F (36.9 C), temperature source Oral, resp. rate 18, height 5\' 10"  (1.778 m), weight 91.4 kg (201 lb 8 oz), SpO2 100 %.  Intake/Output Summary (Last 24 hours) at 01/01/15 1322 Last data filed at 01/01/15 0949  Gross per 24 hour  Intake    100 ml  Output    550 ml  Net   -450 ml   Exam: Pt seen for f/u visit.  Data Reviewed: Basic Metabolic Panel:  Recent Labs Lab 12/31/14 2237 12/31/14 2241  NA 135 137  K 3.6 3.6  CL 98* 98*  CO2 26  --   GLUCOSE 193* 192*  BUN 9 10  CREATININE 1.08 1.10  CALCIUM 9.2  --     CBC:  Recent Labs Lab 12/31/14 2237 12/31/14 2241  WBC 8.0  --   NEUTROABS 4.5  --   HGB 11.6* 13.9  HCT 36.1* 41.0  MCV 61.7*  --   PLT 172  --     Liver Function Tests:  Recent Labs Lab 12/31/14 2237  AST 21  ALT 16*  ALKPHOS 71  BILITOT 0.5  PROT 6.7  ALBUMIN 4.1    Coags:  Recent Labs Lab 12/31/14 2237  INR 0.99    Recent Labs Lab 12/31/14 2237  APTT 27    CBG:  Recent Labs Lab 01/01/15 0631 01/01/15 1103  GLUCAP 174* 260*    Studies:     Recent x-ray studies have been reviewed in detail by the Attending Physician  Scheduled Meds:  Scheduled Meds: .  stroke: mapping our early stages of recovery book   Does not apply Once  . aspirin  325 mg Oral Daily  . enoxaparin (LOVENOX) injection  40 mg Subcutaneous Q24H  . insulin aspart  0-15 Units Subcutaneous TID WC  . insulin glargine  10 Units Subcutaneous QHS    Time spent on care of this patient: No charge   Cherene Altes , MD   [facility] Office  [phone] Pager - Text Page per Shea Evans as per below:  On-Call/Text Page:      [email or web address]      password [email or web address]  If 7PM-7AM, please contact night-coverage [email or web address] Password [email or web address] 01/01/2015, 1:22 PM   LOS: 0 days

## 2015-01-02 ENCOUNTER — Inpatient Hospital Stay (HOSPITAL_COMMUNITY): Payer: Self-pay

## 2015-01-02 ENCOUNTER — Encounter (HOSPITAL_COMMUNITY): Payer: Self-pay | Admitting: *Deleted

## 2015-01-02 LAB — RAPID URINE DRUG SCREEN, HOSP PERFORMED
AMPHETAMINES: NOT DETECTED
Barbiturates: NOT DETECTED
Benzodiazepines: NOT DETECTED
Cocaine: NOT DETECTED
Opiates: NOT DETECTED
TETRAHYDROCANNABINOL: POSITIVE — AB

## 2015-01-02 LAB — HEMOGLOBIN A1C
Hgb A1c MFr Bld: 9.2 % — ABNORMAL HIGH (ref 4.8–5.6)
Mean Plasma Glucose: 217 mg/dL

## 2015-01-02 LAB — GLUCOSE, CAPILLARY
GLUCOSE-CAPILLARY: 183 mg/dL — AB (ref 65–99)
GLUCOSE-CAPILLARY: 151 mg/dL — AB (ref 65–99)
Glucose-Capillary: 154 mg/dL — ABNORMAL HIGH (ref 65–99)
Glucose-Capillary: 164 mg/dL — ABNORMAL HIGH (ref 65–99)

## 2015-01-02 MED ORDER — HYDRALAZINE HCL 10 MG PO TABS
10.0000 mg | ORAL_TABLET | Freq: Three times a day (TID) | ORAL | Status: DC
  Administered 2015-01-02 – 2015-01-03 (×3): 10 mg via ORAL
  Filled 2015-01-02 (×3): qty 1

## 2015-01-02 MED ORDER — IOHEXOL 350 MG/ML SOLN
50.0000 mL | Freq: Once | INTRAVENOUS | Status: AC | PRN
  Administered 2015-01-02: 50 mL via INTRAVENOUS

## 2015-01-02 MED ORDER — HYDRALAZINE HCL 20 MG/ML IJ SOLN
5.0000 mg | Freq: Four times a day (QID) | INTRAMUSCULAR | Status: DC | PRN
  Filled 2015-01-02: qty 1

## 2015-01-02 NOTE — Progress Notes (Signed)
Patient ID: Frank Schneider, male   DOB: Jan 23, 1976, 39 y.o.   MRN: [id number]  [facility] PROGRESS NOTE  Frank Schneider NWG:[id number] DOB: [birth year] DOA: 12/31/2014 PCP: Default, Provider, MD   Brief narrative:    39 y.o. male with h/o DM2 with A1C of 10, and HTN who reported waking up with feeling of numbness and weakness in his left side. When his symptoms did not resolve he presented to the ED for evaluation.  In the ED MRI brain confirmed acute infarct.    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 Rosalin Hawking in 2 months, order placed.  Hypertension - SBP in 150 - 160 this AM - pt is currently not on any antihypertensive regimen - will place on hydralazine 10 mg PO TID - if BP stable in AM, can be d/c home  Diabetes type II, uncontrolled - HgbA1c 9.2, goal < 7.0 - SW consult to set up with PCP - continue Lantus   Tobacco abuse - smoking cessation provided, pt verbalized understanding and ready to quit smoking   DVT prophylaxis - Lovenox SQ  Code Status: Full.  Family Communication:  plan of care discussed with the patient Disposition Plan: Home in Am if BP stable   IV access:  Peripheral IV  Procedures and diagnostic studies:    Ct Angio Head W/cm &/or Wo Cm  01/02/2015   CLINICAL DATA:  Stroke follow-up  EXAM: CT ANGIOGRAPHY HEAD AND NECK  TECHNIQUE: Multidetector CT imaging of the head and neck was performed using the standard protocol during bolus administration of intravenous contrast. Multiplanar CT image reconstructions and MIPs were obtained to evaluate the vascular anatomy. Carotid stenosis measurements (when applicable) are obtained utilizing NASCET criteria,  using the distal internal carotid diameter as the denominator.  CONTRAST:  34mL OMNIPAQUE IOHEXOL 350 MG/ML SOLN  COMPARISON:  Brain MRI from yesterday  FINDINGS: CT HEAD  Brain: The acute nonhemorrhagic sub cm infarct in the right thalamus is now visible. No evidence of interval infarction. No pre-existing small-vessel disease. No hydrocephalus or shift.  Calvarium and skull base: No significant finding. Evidence of arrested aeration of the right sphenoid sinus.  Paranasal sinuses: Mild scattered mucosal edema.  Orbits: Negative  CTA NECK  Aortic arch: No aneurysm, dissection, or atherosclerotic change. Two vessel branching.  Right carotid system: Atheromatous partly calcified mural thickening at the bifurcation without flow limiting stenosis. No evidence of dissection or vasculopathy.  Left carotid system: Subtle atherosclerotic changes at the bifurcation without stenosis. No evidence of dissection or vasculopathy.  Vertebral arteries:Symmetric and widely patent. Negative proximal subclavian arteries.  Skeleton: Poor dentition with notable left lower second premolar cavity and periapical erosion with neighboring osteitis. Left inferior maxillary sinus mucosal thickening may be odontogenic.  C4 to C6 bulky ventral endplate spurs with esophageal and hypopharyngeal distortion, out of proportion to disc narrowing. Similar changes partly visible in the upper thoracic spine. Findings consistent with diffuse idiopathic skeletal hyperostosis.  Other neck: No incidental adenopathy or mass seen in the neck. Prominent adenoid tonsils.  CTA HEAD  Limited by venous contamination.  Anterior circulation: Symmetric carotid arteries with no visible communicating arteries. Bilateral carotid siphon atherosclerotic calcification with mild  narrowing at the left anterior genu. No flow limiting stenosis, major branch occlusion, or aneurysm.  Posterior circulation: Balanced vertebral arteries with bilateral cerebellar branches,  duplicated at the left superior cerebellar artery. No evidence of dissection, stenosis, or major branch occlusion to explain the acute infarct.  Venous sinuses: Patent  Anatomic variants: None significant  Delayed phase: No abnormal enhancement.  IMPRESSION: 1. No acute arterial finding or flow limiting stenosis to explain the right thalamoperforator infarct. 2. Mild cervical and intracranial carotid atherosclerosis. 3. 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   Electronically Signed   By: Monte Fantasia M.D.   On: 01/02/2015 11:02   Ct Head Wo Contrast  01/01/2015   CLINICAL DATA:  39 year old male with left-sided numbness.  EXAM: CT HEAD WITHOUT CONTRAST  TECHNIQUE: Contiguous axial images were obtained from the base of the skull through the vertex without intravenous contrast.  COMPARISON:  CT dated 08/07/2012  FINDINGS: The ventricles and the sulci are appropriate in size for the patient's age. There is no intracranial hemorrhage. No midline shift or mass effect identified. The gray-white matter differentiation is preserved.  Mild mucoperiosteal thickening of the maxillary sinuses and ethmoids air cells with multiple left maxillary sinus retention cyst/polyps. The remainder of the visualized paranasal sinuses and mastoid air cells are clear. The calvarium is intact.  IMPRESSION: No acute intracranial pathology.   Electronically Signed   By: Anner Crete M.D.   On: 01/01/2015 00:57   Ct Angio Neck W/cm &/or Wo/cm 01/02/2015  No acute arterial finding or flow limiting stenosis to explain the right thalamoperforator infarct. 2. Mild cervical and intracranial carotid atherosclerosis. 3. 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     Mr Brain Wo Contrast 01/01/2015 Acute 7 x 7 x 11 mm ischemic infarct within the right thalamus. No associated hemorrhage or significant mass effect. 2. No other acute intracranial process identified.     Medical Consultants:  Neurology   Other Consultants:  PT  IAnti-Infectives:   None  Faye Ramsay, MD  [facility] Pager [phone]  If 7PM-7AM, please contact night-coverage [email or web address] Password [email or web address] 01/02/2015, 2:00 PM   LOS: 1 day   HPI/Subjective: No events overnight.   Objective: Filed Vitals:   01/02/15 0107 01/02/15 0512 01/02/15 0954 01/02/15 1336  BP: 136/63 124/66 149/86 157/79  Pulse: 56 52 53 57  Temp: 98.1 F (36.7 C) 97.7 F (36.5 C) 98.1 F (36.7 C) 98 F (36.7 C)  TempSrc: Oral Oral Oral Oral  Resp: 18 18 20 20   Height:      Weight:      SpO2: 100% 99% 99% 100%    Intake/Output Summary (Last 24 hours) at 01/02/15 1400 Last data filed at 01/02/15 1225  Gross per 24 hour  Intake    600 ml  Output   1075 ml  Net   -475 ml    Exam:   General:  Pt is alert, follows commands appropriately, not in acute distress  Cardiovascular: Regular rate and rhythm, S1/S2, no murmurs, no rubs, no gallops  Respiratory: Clear to auscultation bilaterally, no wheezing, no crackles, no rhonchi  Abdomen: Soft, non tender, non distended, bowel sounds present, no guarding  Extremities: No edema, pulses DP and PT palpable bilaterally  Neuro: L UP and LE strength 4-5/5 with slightly diminished sensation on that side  Data Reviewed: Basic Metabolic Panel:  Recent Labs Lab 12/31/14 2237 12/31/14 2241  NA 135 137  K 3.6 3.6  CL 98* 98*  CO2 26  --   GLUCOSE 193* 192*  BUN 9 10  CREATININE 1.08 1.10  CALCIUM 9.2  --    Liver Function Tests:  Recent Labs Lab 12/31/14 2237  AST 21  ALT 16*  ALKPHOS 71  BILITOT 0.5  PROT 6.7  ALBUMIN 4.1   CBC:  Recent Labs Lab 12/31/14 2237 12/31/14 2241  WBC 8.0  --   NEUTROABS 4.5  --   HGB 11.6* 13.9  HCT 36.1* 41.0  MCV 61.7*  --   PLT 172  --    CBG:  Recent Labs Lab 01/01/15 1103 01/01/15 1642 01/01/15 2132 01/02/15 0652 01/02/15 1139  GLUCAP 260* 164* 173* 154* 183*    Scheduled  Meds: .  stroke: mapping our early stages of recovery book   Does not apply Once  . aspirin  325 mg Oral Daily  . enoxaparin (LOVENOX) injection  40 mg Subcutaneous Q24H  . insulin aspart  0-15 Units Subcutaneous TID WC  . insulin glargine  16 Units Subcutaneous QHS   Continuous Infusions:

## 2015-01-02 NOTE — Evaluation (Signed)
Speech Language Pathology Evaluation Patient Details Name: Frank Schneider MRN: [id number] DOB: 10/11/1975 Today's Date: 01/02/2015 Time: 0973-5329 SLP Time Calculation (min) (ACUTE ONLY): 35 min  Problem List:  Patient Active Problem List   Diagnosis Date Noted  . CVA (cerebral vascular accident) 01/01/2015  . Diabetes mellitus type 2 with complications, uncontrolled 01/01/2015  . Stroke   . Essential hypertension   . Smoker   . Tobacco use    Past Medical History:  Past Medical History  Diagnosis Date  . Hypertension   . Priapism   . Tobacco use   . Diabetes mellitus     Takes Metformin   Past Surgical History:  Past Surgical History  Procedure Laterality Date  . Penectomy    . Incision and drainage perirectal abscess  07/28/2011    Procedure: IRRIGATION AND DEBRIDEMENT PERIRECTAL ABSCESS;  Surgeon: Adin Hector, MD;  Location: [facility];  Service: General;  Laterality: N/A;   of skin muscle and subcutaneous tissue of perimeum  8cmx12cm area    HPI:  Frank Schneider is a 39 y.o. male with h/o DM2, poorly controlled with A1C of 10 or more for the past 8 years since diagnosis he says, HTN. Patient reports waking up yesterday with feeling of numbness and weakness in his left side. When his symptoms did not resolve he presented to the ED for evaluation later in the day. Found to have right sided acute thalamic infarct.    Assessment / Plan / Recommendation Clinical Impression  Pt presents with normal cognition with questionable mild memory deficit due to stroke. Pt was alert and oriented x4 and domonstrated age and educational level appropriate attention, awareness, problem solving, and executive function. Pt had difficulty with memory retrieval during [facility] assessment, but could recall recent personal memory (e.g tests during hospitalization, and events leading up to hospitalization). Pt denies memory issues, reported he lives with his family in [place] and wishes to go back  to school. SLP educated the pt that if in the future he notices memory or other cognitive problems, he can seek out an SLP or Psychiatrist for treatment and techniques. No f/u SLP tx needed at this time.     SLP Assessment  Patient does not need any further Speech Lanaguage Pathology Services    Follow Up Recommendations  None    Frequency and Duration        Pertinent Vitals/Pain     SLP Goals     SLP Evaluation Prior Functioning  Cognitive/Linguistic Baseline: Within functional limits Type of Home: House  Lives With: Family Available Help at Discharge: Family Education: some college Vocation: Unemployed   Cognition  Overall Cognitive Status: Impaired/Different from baseline Arousal/Alertness: Awake/alert Orientation Level: Oriented X4 Attention: Selective Selective Attention: Appears intact Memory: Impaired Memory Impairment: Retrieval deficit;Storage deficit;Decreased recall of new information Awareness: Appears intact Problem Solving: Appears intact Safety/Judgment: Appears intact    Comprehension  Auditory Comprehension Overall Auditory Comprehension: Appears within functional limits for tasks assessed Visual Recognition/Discrimination Discrimination: Within Function Limits Reading Comprehension Reading Status: Not tested    Expression Expression Primary Mode of Expression: Verbal Verbal Expression Overall Verbal Expression: Appears within functional limits for tasks assessed Written Expression Written Expression: Not tested   Oral / Motor Oral Motor/Sensory Function Overall Oral Motor/Sensory Function: Appears within functional limits for tasks assessed Motor Speech Overall Motor Speech: Appears within functional limits for tasks assessed   GO     Lanier Ensign, Student-SLP  Lanier Ensign 01/02/2015, 2:51 PM

## 2015-01-02 NOTE — Progress Notes (Signed)
Pt continues to complain of intermittent tingling in left leg, but no complaints of pain.  No drift noted this morning in left upper extremity.  Urine drug screen collected and sent.  Will continue to monitor.

## 2015-01-02 NOTE — Progress Notes (Signed)
Inpatient Diabetes Program Recommendations  AACE/ADA: New Consensus Statement on Inpatient Glycemic Control (2013)  Target Ranges:  Prepandial:   less than 140 mg/dL      Peak postprandial:   less than 180 mg/dL (1-2 hours)      Critically ill patients:  140 - 180 mg/dL   Reason for Visit: Consult for 'follow up PCP" Case management and financial advisor have seen patient and addressed in notes Diabetes history: type 2 Outpatient Diabetes medications: metformin  Current orders for Inpatient glycemic control: lantus 16 units and moderate correction  Pt will most probably need lantus at home with A1C of 9.9%, however I am not sure patient will be able to do on her own. Pt lives with mother. Will need to follow up most probably with [facility] appt where she can most probably get her meds and care and instruction on insulin to mother/patient.  Thank you Rosita Kea, RN, MSN, CDE  Diabetes Inpatient Program Office: [phone] Pager: [phone] 8:00 am to 5:00 pm

## 2015-01-02 NOTE — Progress Notes (Signed)
STROKE TEAM PROGRESS NOTE   SUBJECTIVE (INTERVAL HISTORY) No family at the bedside. Patient without complaints. CTA head and neck done no large vessel stenosis.    OBJECTIVE Temp:  [97.7 F (36.5 C)-98.6 F (37 C)] 98.1 F (36.7 C) (09/07 0954) Pulse Rate:  [52-64] 53 (09/07 0954) Cardiac Rhythm:  [-] Sinus bradycardia (09/07 0700) Resp:  [16-20] 20 (09/07 0954) BP: (124-168)/(57-87) 149/86 mmHg (09/07 0954) SpO2:  [99 %-100 %] 99 % (09/07 0954)  CBC:   Recent Labs Lab 12/31/14 2237 12/31/14 2241  WBC 8.0  --   NEUTROABS 4.5  --   HGB 11.6* 13.9  HCT 36.1* 41.0  MCV 61.7*  --   PLT 172  --     Basic Metabolic Panel:   Recent Labs Lab 12/31/14 2237 12/31/14 2241  NA 135 137  K 3.6 3.6  CL 98* 98*  CO2 26  --   GLUCOSE 193* 192*  BUN 9 10  CREATININE 1.08 1.10  CALCIUM 9.2  --     Lipid Panel:     Component Value Date/Time   CHOL 108 01/01/2015 0625   TRIG 268* 01/01/2015 0625   HDL 27* 01/01/2015 0625   CHOLHDL 4.0 01/01/2015 0625   VLDL 54* 01/01/2015 0625   LDLCALC 27 01/01/2015 0625   HgbA1c:  Lab Results  Component Value Date   HGBA1C 9.2* 01/01/2015   Urine Drug Screen:     Component Value Date/Time   LABOPIA NONE DETECTED 07/28/2011 1613   COCAINSCRNUR NONE DETECTED 07/28/2011 1613   LABBENZ NONE DETECTED 07/28/2011 1613   AMPHETMU NONE DETECTED 07/28/2011 1613   THCU POSITIVE* 07/28/2011 1613   LABBARB NONE DETECTED 07/28/2011 1613     IMAGING  Ct Head Wo Contrast 01/01/2015   No acute intracranial pathology.      Mr Brain Wo Contrast 01/01/2015   1. Acute 7 x 7 x 11 mm ischemic infarct within the right thalamus. No associated hemorrhage or significant mass effect. 2. No other acute intracranial process identified.    CTA head and neck  1. No acute arterial finding or flow limiting stenosis to explain the right thalamoperforator infarct. 2. Mild cervical and intracranial carotid atherosclerosis. 3. Diffuse idiopathic skeletal  hyperostosis of the cervical and thoracic spine.  2D echo - - Left ventricle: The cavity size was normal. Wall thickness wasincreased in a pattern of moderate LVH. Systolic function wasnormal. The estimated ejection fraction was in the rate of 60%to 65%. Wall motion was normal; there were no regional wallmotion abnormalities. Left ventricular diastolic functionparameters were normal. - Mitral valve: There was mild regurgitation. Impressions:  No cardiac source of emboli was indentified.   PHYSICAL EXAM  General - Well nourished, well developed, in no apparent distress.  Ophthalmologic - Sharp disc margins OU.   Cardiovascular - Regular rate and rhythm with no murmur.  Mental Status -  Level of arousal and orientation to time, place, and person were intact. Language including expression, naming, repetition, comprehension was assessed and found intact. Fund of Knowledge was assessed and was intact.  Cranial Nerves II - XII - II - Visual field intact OU. III, IV, VI - Extraocular movements intact. V - Facial sensation decreased on the left. VII - Facial movement intact bilaterally. VIII - Hearing & vestibular intact bilaterally. X - Palate elevates symmetrically. XI - Chin turning & shoulder shrug intact bilaterally. XII - Tongue protrusion intact.  Motor Strength - The patient's strength was normal in all extremities except LUE  5-/5 proximally and pronator drift was present on the left.  Bulk was normal and fasciculations were absent.   Motor Tone - Muscle tone was assessed at the neck and appendages and was normal.  Reflexes - The patient's reflexes were 1+ in all extremities and he had no pathological reflexes.  Sensory - Light touch, temperature/pinprick were assessed and were decreased on the left.    Coordination - The patient had normal movements in the hands and feet with no ataxia or dysmetria.  Tremor was absent.  Gait and Station - deferred     ASSESSMENT/PLAN [name] is a 39 y.o. male with history of diabetes and hypertension presenting with left sided numbness and weakness. He did not receive IV t-PA due to delay in arriva.   Stroke:  Non-dominant right thalamic infarct secondary to small vessel disease source  MRI  R thalamic infarct  CTA head and neck - no stenosis  2D Echo  unremarkable   LDL 27  HgbA1c 9.2  Lovenox 40 mg sq daily for VTE prophylaxis Diet Carb Modified Fluid consistency:: Thin; Room service appropriate?: Yes  no antithrombotic prior to admission, now on aspirin 325 mg orally every day. Continue ASA on discharge.  Patient counseled to be compliant with his antithrombotic medications  Consider for Stroke AF trial. Research coordinator to review chart  Ongoing aggressive stroke risk factor management  Therapy recommendations:  No therapy needs  Disposition:  [place] for discharge home from stroke standpoint [place] Patient has a 10-15% risk of having another stroke over the next year, the highest risk is within 2 weeks of the most recent stroke/TIA (risk of having a stroke following a stroke or TIA is the same). Ongoing risk factor control by Primary Care Physician Stroke Service will sign off. Please call should any needs arise. Follow-up Stroke Clinic at [facility] with Dr. Rosalin Hawking in 2 months, order placed.  Hypertension  Stable Permissive hypertension (OK if < 220/120) but gradually normalize in 5-7 days  Diabetes type II, uncontrolled  HgbA1c 9.2, goal < 7.0  Recommend pt get a primary MD to further evaluate diabetes and treatment course  Uncontrolled  SSI  CBG monitoring  DM education  On lantus  Need close follow up with PCP  Tobacco abuse  Current smoker  Smoking cessation counseling provided  Pt is willing to quit  Other Stroke Risk Factors  Positive THC this admission  Other Active  Problems  Headaches twice a week on average  On clinical trial for DM  Hospital day # 1  Neurology will sign off. Please call with questions. Pt will follow up with Dr. Erlinda Hong at [facility] in about 2 months. Thanks for the consult.  Rosalin Hawking, MD PhD Stroke Neurology 01/02/2015 3:54 PM    To contact Stroke Continuity provider, please refer to [email or web address] After hours, contact General Neurology

## 2015-01-02 NOTE — Evaluation (Signed)
Physical Therapy Evaluation Patient Details Name: Frank Schneider MRN: [id number] DOB: 1976/04/13 Today's Date: 01/02/2015   History of Present Illness  39 y.o. male with history of diabetes and hypertension presenting with left sided numbness and weakness, imaging revealed acute 7 x 7 x 11 mm ischemic infarct within the right thalamus.  Clinical Impression  Patient seen for mobility assessment. Patient does present with some sensory deficits on left, but coordination and function in tact. No significant deficits in mobility at this time. No further acute PT needs. Will sign off.    Follow Up Recommendations No PT follow up    Equipment Recommendations  None recommended by PT    Recommendations for Other Services       Precautions / Restrictions        Mobility  Bed Mobility Overal bed mobility: Independent                Transfers Overall transfer level: Independent                  Ambulation/Gait Ambulation/Gait assistance: Independent Ambulation Distance (Feet): 340 Feet Assistive device: None Gait Pattern/deviations: WFL(Within Functional Limits)     General Gait Details: steady with gait  Stairs Stairs: Yes Stairs assistance: Modified independent (Device/Increase time) Stair Management: Alternating pattern Number of Stairs: 6 General stair comments: one noted LOB during decent requiring use of hand rail to correct, no other issues  Wheelchair Mobility    Modified Rankin (Stroke Patients Only) Modified Rankin (Stroke Patients Only) Pre-Morbid Rankin Score: No symptoms Modified Rankin: Moderate disability     Balance                                 Standardized Balance Assessment Standardized Balance Assessment : Dynamic Gait Index   Dynamic Gait Index Level Surface: Normal Change in Gait Speed: Normal Gait with Horizontal Head Turns: Normal Gait with Vertical Head Turns: Normal Gait and Pivot Turn: Normal Step Over  Obstacle: Mild Impairment Step Around Obstacles: Mild Impairment Steps: Moderate Impairment Total Score: 20       Pertinent Vitals/Pain Pain Assessment: No/denies pain    Home Living Family/patient expects to be discharged to:: Private residence Living Arrangements: Parent Available Help at Discharge: Family Type of Home: Apartment Home Access: Stairs to enter Entrance Stairs-Rails: Right Entrance Stairs-Number of Steps: 3 Home Layout: One level Home Equipment: None      Prior Function Level of Independence: Independent               Hand Dominance   Dominant Hand: Right    Extremity/Trunk Assessment   Upper Extremity Assessment: Defer to OT evaluation           Lower Extremity Assessment: LLE deficits/detail         Communication   Communication: No difficulties  Cognition Arousal/Alertness: Awake/alert Behavior During Therapy: WFL for tasks assessed/performed Overall Cognitive Status: Impaired/Different from baseline                      General Comments      Exercises        Assessment/Plan    PT Assessment Patent does not need any further PT services  PT Diagnosis Difficulty walking   PT Problem List    PT Treatment Interventions     PT Goals (Current goals can be found in the Care Plan section) Acute Rehab PT Goals PT Goal  Formulation: All assessment and education complete, DC therapy    Frequency     Barriers to discharge        Co-evaluation               End of Session Equipment Utilized During Treatment: Gait belt Activity Tolerance: Patient tolerated treatment well Patient left: in bed;with call bell/phone within reach Nurse Communication: Mobility status         Time: 1552-0802 PT Time Calculation (min) (ACUTE ONLY): 14 min   Charges:   PT Evaluation $Initial PT Evaluation Tier I: 1 Procedure     PT G CodesDuncan Dull 01/08/15, 5:03 PM  Alben Deeds, [place] DPT  [phone]

## 2015-01-03 DIAGNOSIS — I639 Cerebral infarction, unspecified: Principal | ICD-10-CM

## 2015-01-03 LAB — HIV ANTIBODY (ROUTINE TESTING W REFLEX): HIV Screen 4th Generation wRfx: NONREACTIVE

## 2015-01-03 LAB — GLUCOSE, CAPILLARY
GLUCOSE-CAPILLARY: 239 mg/dL — AB (ref 65–99)
Glucose-Capillary: 133 mg/dL — ABNORMAL HIGH (ref 65–99)

## 2015-01-03 MED ORDER — FREESTYLE SYSTEM KIT: 1.0000 | PACK | Status: DC | PRN

## 2015-01-03 MED ORDER — INSULIN GLARGINE 100 UNIT/ML ~~LOC~~ SOLN: 16.0000 [IU] | Freq: Every day | SUBCUTANEOUS | Status: DC

## 2015-01-03 MED ORDER — FREESTYLE LANCETS MISC: Status: DC

## 2015-01-03 MED ORDER — "INSULIN SYRINGE 30G X 1/2"" 0.5 ML MISC": Status: DC

## 2015-01-03 MED ORDER — ASPIRIN 325 MG PO TABS: 325.0000 mg | ORAL_TABLET | Freq: Every day | ORAL | Status: DC

## 2015-01-03 MED ORDER — NICOTINE 21 MG/24HR TD PT24: 21.0000 mg | MEDICATED_PATCH | Freq: Every day | TRANSDERMAL | Status: DC

## 2015-01-03 NOTE — Progress Notes (Signed)
Occupational Therapy Evaluation Patient Details Name: Frank Schneider MRN: [id number] DOB: [birth year] Today's Date: 01/03/2015    History of Present Illness 39 y.o. male with history of diabetes and hypertension presenting with left sided numbness and weakness, imaging revealed acute 7 x 7 x 11 mm ischemic infarct within the right thalamus.   Clinical Impression   Pt admitted with the above diagnoses. PTA pt was independent with ADLs. Pt is currently at baseline with ADLs. Pt completed grooming at sink, bed mobility, community distance functional mobility at mod I to Independent level as detailed below. Pt presents with mild LUE weakness and decreased sensation throughout LUE/LLE. Pt educated on compensatory strategies for decreased sensation and safety with ADLs. Issued theraband and instructed in use for strengthening LUE. Session details below. No further acute OT needs. OT signing off.      Follow Up Recommendations  No OT follow up    Equipment Recommendations  None recommended by OT    Recommendations for Other Services       Precautions / Restrictions Restrictions Weight Bearing Restrictions: No      Mobility Bed Mobility Overal bed mobility: Independent             General bed mobility comments: HOB flat  Transfers Overall transfer level: Independent Equipment used: None             General transfer comment: no dizziness; reported LLE "feels funny" during functional mobility due to decreased sensation    Balance Overall balance assessment: Independent                                          ADL Overall ADL's : Modified independent                                       General ADL Comments: Educated pt on compensatory strategies for ADLs with decreased sensation, educuated on HEP with theraband for LUE strengthening, educated on s/s of a stroke. Issued Level 1 therband with teach back education provided. Pt reports  he does not work and does not drive.      Vision Vision Assessment?: No apparent visual deficits Additional Comments: read therapist name badge accurately, denied blurriness/diplopia   Perception     Praxis      Pertinent Vitals/Pain Pain Assessment: No/denies pain     Hand Dominance Right   Extremity/Trunk Assessment Upper Extremity Assessment Upper Extremity Assessment: LUE deficits/detail LUE Deficits / Details: grossly 4/5; decreased sensation throughout; denies difficulty holding onto to items; able to manipulate lids on ADL items with no difficulty LUE Sensation: decreased light touch   Lower Extremity Assessment Lower Extremity Assessment: Defer to PT evaluation       Communication Communication Communication: No difficulties   Cognition Arousal/Alertness: Awake/alert Behavior During Therapy: WFL for tasks assessed/performed Overall Cognitive Status: Impaired/Different from baseline Area of Impairment: Problem solving             Problem Solving: Slow processing     General Comments       Exercises       Shoulder Instructions      Home Living Family/patient expects to be discharged to:: Private residence Living Arrangements: Parent Available Help at Discharge: Family Type of Home: Apartment Home Access: Stairs to enter [employer] of Steps:  3 Entrance Stairs-Rails: Right Home Layout: One level               Home Equipment: None      Lives With: Family    Prior Functioning/Environment Level of Independence: Independent             OT Diagnosis:     OT Problem List:     OT Treatment/Interventions:      OT Goals(Current goals can be found in the care plan section)    OT Frequency:     Barriers to D/C:            Co-evaluation              End of Session    Activity Tolerance: Patient tolerated treatment well Patient left: in bed;with call bell/phone within reach   Time: 0912-0936 OT Time  Calculation (min): 24 min Charges:  OT General Charges $OT Visit: 1 Procedure OT Evaluation $Initial OT Evaluation Tier I: 1 Procedure OT Treatments $Self Care/Home Management : 8-22 mins G-Codes:    Hortencia Pilar 01-30-2015, 9:48 AM

## 2015-01-03 NOTE — Progress Notes (Signed)
Patient is discharged from room 5C15 at this time. Alert and in stable condition. IV site d/c'd as well as tele. Diabetic teaching done and also how to to administer insulin. Instructions read to patient and understanding verbalized. Left unit via wheelchair with all belongings and mother at side.

## 2015-01-03 NOTE — Discharge Summary (Signed)
Physician Discharge Summary  Aron Inge MRN: [id number] DOB/AGE: [birth year] 39 y.o.  PCP: Default, Provider, MD   Admit date: 12/31/2014 Discharge date: 01/03/2015  Discharge Diagnoses:     Principal Problem:   CVA (cerebral vascular accident) Active Problems:   Diabetes mellitus type 2 with complications, uncontrolled   Stroke   Essential hypertension   Smoker    Follow-up recommendations Follow-up with PCP in 3-5 days , including all  additional recommended appointments as below Follow-up CBC, CMP in 3-5 days      Medication List    STOP taking these medications        metFORMIN 1000 MG tablet  Commonly known as:  GLUCOPHAGE     research study medication in dextrose 5 % 50 mL      TAKE these medications        aspirin 325 MG tablet  Take 1 tablet (325 mg total) by mouth daily.     diphenhydramine-acetaminophen 25-500 MG Tabs  Commonly known as:  TYLENOL PM  Take 2 tablets by mouth at bedtime as needed (sleep/pain).     freestyle lancets  Use as instructed     glucose monitoring kit monitoring kit  1 each by Does not apply route as needed for other.     insulin glargine 100 UNIT/ML injection  Commonly known as:  LANTUS  Inject 0.16 mLs (16 Units total) into the skin at bedtime.     lisinopril 20 MG tablet  Commonly known as:  PRINIVIL,ZESTRIL  Take 1 tablet (20 mg total) by mouth daily.         Discharge Condition: Stable Disposition: 01-Home or Self Care   Consults: Neurology   Significant Diagnostic Studies:  Ct Angio Head W/cm &/or Wo Cm  01/02/2015   CLINICAL DATA:  Stroke follow-up  EXAM: CT ANGIOGRAPHY HEAD AND NECK  TECHNIQUE: Multidetector CT imaging of the head and neck was performed using the standard protocol during bolus administration of intravenous contrast. Multiplanar CT image reconstructions and MIPs were obtained to evaluate the vascular anatomy. Carotid stenosis measurements (when applicable) are obtained utilizing  NASCET criteria, using the distal internal carotid diameter as the denominator.  CONTRAST:  26m OMNIPAQUE IOHEXOL 350 MG/ML SOLN  COMPARISON:  Brain MRI from yesterday  FINDINGS: CT HEAD  Brain: The acute nonhemorrhagic sub cm infarct in the right thalamus is now visible. No evidence of interval infarction. No pre-existing small-vessel disease. No hydrocephalus or shift.  Calvarium and skull base: No significant finding. Evidence of arrested aeration of the right sphenoid sinus.  Paranasal sinuses: Mild scattered mucosal edema.  Orbits: Negative  CTA NECK  Aortic arch: No aneurysm, dissection, or atherosclerotic change. Two vessel branching.  Right carotid system: Atheromatous partly calcified mural thickening at the bifurcation without flow limiting stenosis. No evidence of dissection or vasculopathy.  Left carotid system: Subtle atherosclerotic changes at the bifurcation without stenosis. No evidence of dissection or vasculopathy.  Vertebral arteries:Symmetric and widely patent. Negative proximal subclavian arteries.  Skeleton: Poor dentition with notable left lower second premolar cavity and periapical erosion with neighboring osteitis. Left inferior maxillary sinus mucosal thickening may be odontogenic.  C4 to C6 bulky ventral endplate spurs with esophageal and hypopharyngeal distortion, out of proportion to disc narrowing. Similar changes partly visible in the upper thoracic spine. Findings consistent with diffuse idiopathic skeletal hyperostosis.  Other neck: No incidental adenopathy or mass seen in the neck. Prominent adenoid tonsils.  CTA HEAD  Limited by venous contamination.  Anterior circulation: Symmetric  carotid arteries with no visible communicating arteries. Bilateral carotid siphon atherosclerotic calcification with mild narrowing at the left anterior genu. No flow limiting stenosis, major branch occlusion, or aneurysm.  Posterior circulation: Balanced vertebral arteries with bilateral cerebellar  branches, duplicated at the left superior cerebellar artery. No evidence of dissection, stenosis, or major branch occlusion to explain the acute infarct.  Venous sinuses: Patent  Anatomic variants: None significant  Delayed phase: No abnormal enhancement.  IMPRESSION: 1. No acute arterial finding or flow limiting stenosis to explain the right thalamoperforator infarct. 2. Mild cervical and intracranial carotid atherosclerosis. 3. 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   Electronically Signed   By: Monte Fantasia M.D.   On: 01/02/2015 11:02   Ct Head Wo Contrast  01/01/2015   CLINICAL DATA:  39 year old male with left-sided numbness.  EXAM: CT HEAD WITHOUT CONTRAST  TECHNIQUE: Contiguous axial images were obtained from the base of the skull through the vertex without intravenous contrast.  COMPARISON:  CT dated 08/07/2012  FINDINGS: The ventricles and the sulci are appropriate in size for the patient's age. There is no intracranial hemorrhage. No midline shift or mass effect identified. The gray-white matter differentiation is preserved.  Mild mucoperiosteal thickening of the maxillary sinuses and ethmoids air cells with multiple left maxillary sinus retention cyst/polyps. The remainder of the visualized paranasal sinuses and mastoid air cells are clear. The calvarium is intact.  IMPRESSION: No acute intracranial pathology.   Electronically Signed   By: Anner Crete M.D.   On: 01/01/2015 00:57   Ct Angio Neck W/cm &/or Wo/cm  01/02/2015   CLINICAL DATA:  Stroke follow-up  EXAM: CT ANGIOGRAPHY HEAD AND NECK  TECHNIQUE: Multidetector CT imaging of the head and neck was performed using the standard protocol during bolus administration of intravenous contrast. Multiplanar CT image reconstructions and MIPs were obtained to evaluate the vascular anatomy. Carotid stenosis measurements (when applicable) are obtained utilizing NASCET criteria, using the distal internal carotid  diameter as the denominator.  CONTRAST:  53m OMNIPAQUE IOHEXOL 350 MG/ML SOLN  COMPARISON:  Brain MRI from yesterday  FINDINGS: CT HEAD  Brain: The acute nonhemorrhagic sub cm infarct in the right thalamus is now visible. No evidence of interval infarction. No pre-existing small-vessel disease. No hydrocephalus or shift.  Calvarium and skull base: No significant finding. Evidence of arrested aeration of the right sphenoid sinus.  Paranasal sinuses: Mild scattered mucosal edema.  Orbits: Negative  CTA NECK  Aortic arch: No aneurysm, dissection, or atherosclerotic change. Two vessel branching.  Right carotid system: Atheromatous partly calcified mural thickening at the bifurcation without flow limiting stenosis. No evidence of dissection or vasculopathy.  Left carotid system: Subtle atherosclerotic changes at the bifurcation without stenosis. No evidence of dissection or vasculopathy.  Vertebral arteries:Symmetric and widely patent. Negative proximal subclavian arteries.  Skeleton: Poor dentition with notable left lower second premolar cavity and periapical erosion with neighboring osteitis. Left inferior maxillary sinus mucosal thickening may be odontogenic.  C4 to C6 bulky ventral endplate spurs with esophageal and hypopharyngeal distortion, out of proportion to disc narrowing. Similar changes partly visible in the upper thoracic spine. Findings consistent with diffuse idiopathic skeletal hyperostosis.  Other neck: No incidental adenopathy or mass seen in the neck. Prominent adenoid tonsils.  CTA HEAD  Limited by venous contamination.  Anterior circulation: Symmetric carotid arteries with no visible communicating arteries. Bilateral carotid siphon atherosclerotic calcification with mild narrowing at the left anterior genu. No flow limiting stenosis, major branch  occlusion, or aneurysm.  Posterior circulation: Balanced vertebral arteries with bilateral cerebellar branches, duplicated at the left superior cerebellar  artery. No evidence of dissection, stenosis, or major branch occlusion to explain the acute infarct.  Venous sinuses: Patent  Anatomic variants: None significant  Delayed phase: No abnormal enhancement.  IMPRESSION: 1. No acute arterial finding or flow limiting stenosis to explain the right thalamoperforator infarct. 2. Mild cervical and intracranial carotid atherosclerosis. 3. 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   Electronically Signed   By: Monte Fantasia M.D.   On: 01/02/2015 11:02   Mr Brain Wo Contrast  01/01/2015   CLINICAL DATA:  Initial evaluation for acute onset left-sided numbness.  EXAM: MRI HEAD WITHOUT CONTRAST  TECHNIQUE: Multiplanar, multiecho pulse sequences of the brain and surrounding structures were obtained without intravenous contrast.  COMPARISON:  Prior CT from 12/31/2014.  FINDINGS: Cerebral volume within normal limits for patient age. No significant white matter disease.  There is an acute ischemic infarct measuring approximately 7 x 7 x 11 mm present within the lateral right thalamus (series 3, image 21). Slight inferior extension towards the right cerebral peduncle. No associated hemorrhage or mass effect. No other ischemic infarct. Normal intravascular flow voids maintained. No acute or chronic intracranial hemorrhage.  No mass lesion, midline shift, or mass effect. No hydrocephalus. No extra-axial fluid collection.  Craniocervical junction within normal limits. Visualized upper cervical spine unremarkable. Pituitary gland normal.  No acute abnormality about the orbits.  Scattered mucosal thickening within the ethmoidal air cells, left sphenoid sinus, and maxillary sinuses. Small retention cyst present within the maxillary sinuses. No mastoid effusion. Inner ear structures grossly normal.  Bone marrow signal intensity within normal limits. Scalp soft tissues unremarkable.  IMPRESSION: 1. Acute 7 x 7 x 11 mm ischemic infarct within the right  thalamus. No associated hemorrhage or significant mass effect. 2. No other acute intracranial process identified.   Electronically Signed   By: Jeannine Boga M.D.   On: 01/01/2015 03:45        Filed Weights   12/31/14 2208  Weight: 91.4 kg (201 lb 8 oz)     Microbiology: No results found for this or any previous visit (from the past 240 hour(s)).     Blood Culture    Component Value Date/Time   SDES PERITONEAL FOURNIER'S GANGRENE 07/28/2011 [place] GANGRENE 07/28/2011 1824   SPECREQUEST NONE 07/28/2011 1824   SPECREQUEST NONE 07/28/2011 1824   CULT NO ANAEROBES ISOLATED 07/28/2011 1824   CULT  07/28/2011 1824    MULTIPLE ORGANISMS PRESENT, NONE PREDOMINANT Note: NO STAPHYLOCOCCUS AUREUS ISOLATED NO GROUP A STREP (S.PYOGENES) ISOLATED   REPTSTATUS 08/02/2011 FINAL 07/28/2011 1824   REPTSTATUS 07/31/2011 FINAL 07/28/2011 1824      Labs: Results for orders placed or performed during the hospital encounter of 12/31/14 (from the past 48 hour(s))  Glucose, capillary     Status: Abnormal   Collection Time: 01/01/15 11:03 AM  Result Value Ref Range   Glucose-Capillary 260 (H) 65 - 99 mg/dL   Comment 1 Notify RN    Comment 2 Document in Chart   Glucose, capillary     Status: Abnormal   Collection Time: 01/01/15  4:42 PM  Result Value Ref Range   Glucose-Capillary 164 (H) 65 - 99 mg/dL   Comment 1 Notify RN    Comment 2 Document in Chart   Glucose, capillary     Status: Abnormal   Collection Time:  01/01/15  9:32 PM  Result Value Ref Range   Glucose-Capillary 173 (H) 65 - 99 mg/dL   Comment 1 Notify RN    Comment 2 Document in Chart   Glucose, capillary     Status: Abnormal   Collection Time: 01/02/15  6:52 AM  Result Value Ref Range   Glucose-Capillary 154 (H) 65 - 99 mg/dL  HIV antibody     Status: None   Collection Time: 01/02/15  9:51 AM  Result Value Ref Range   HIV Screen 4th Generation wRfx Non Reactive Non Reactive     Comment: (NOTE) Performed At: [facility] [place], [place] [id number] Lindon Romp MD WU:[id number]   Urine rapid drug screen (hosp performed)     Status: Abnormal   Collection Time: 01/02/15 11:00 AM  Result Value Ref Range   Opiates NONE DETECTED NONE DETECTED   Cocaine NONE DETECTED NONE DETECTED   Benzodiazepines NONE DETECTED NONE DETECTED   Amphetamines NONE DETECTED NONE DETECTED   Tetrahydrocannabinol POSITIVE (A) NONE DETECTED   Barbiturates NONE DETECTED NONE DETECTED    Comment:        DRUG SCREEN FOR MEDICAL PURPOSES ONLY.  IF CONFIRMATION IS NEEDED FOR ANY PURPOSE, NOTIFY LAB WITHIN 5 DAYS.        LOWEST DETECTABLE LIMITS FOR URINE DRUG SCREEN Drug Class       Cutoff (ng/mL) Amphetamine      1000 Barbiturate      200 Benzodiazepine   956 Tricyclics       213 Opiates          300 Cocaine          300 THC              50   Glucose, capillary     Status: Abnormal   Collection Time: 01/02/15 11:39 AM  Result Value Ref Range   Glucose-Capillary 183 (H) 65 - 99 mg/dL  Glucose, capillary     Status: Abnormal   Collection Time: 01/02/15  4:22 PM  Result Value Ref Range   Glucose-Capillary 151 (H) 65 - 99 mg/dL   Comment 1 Notify RN    Comment 2 Document in Chart   Glucose, capillary     Status: Abnormal   Collection Time: 01/02/15  9:56 PM  Result Value Ref Range   Glucose-Capillary 164 (H) 65 - 99 mg/dL  Glucose, capillary     Status: Abnormal   Collection Time: 01/03/15  6:39 AM  Result Value Ref Range   Glucose-Capillary 133 (H) 65 - 99 mg/dL     Lipid Panel     Component Value Date/Time   CHOL 108 01/01/2015 0625   TRIG 268* 01/01/2015 0625   HDL 27* 01/01/2015 0625   CHOLHDL 4.0 01/01/2015 0625   VLDL 54* 01/01/2015 0625   LDLCALC 27 01/01/2015 0625     Lab Results  Component Value Date   HGBA1C 9.2* 01/01/2015   HGBA1C 11.3* 07/28/2011     Lab Results  Component Value Date   LDLCALC 27 01/01/2015    CREATININE 1.10 12/31/2014     Mr. Skyelar Christina is a 39 y.o. male with history of diabetes and hypertension presenting with left sided numbness and weakness. He did not receive IV t-PA due to delay in arriva.   Stroke: Non-dominant right thalamic infarct secondary to small vessel disease source  MRI R thalamic infarct  CTA head and neck - no stenosis  2D Echo unremarkable  LDL 27  HgbA1c 9.2  Lovenox 40 mg sq daily for VTE prophylaxis  Diet Carb Modified Fluid consistency:: Thin; Room    no antithrombotic prior to admission, now on aspirin 325 mg orally every day. Continue ASA on discharge.  Patient counseled to be compliant with his antithrombotic medications  Consider for Stroke AF trial. Research coordinator to review chart  Therapy recommendations: No therapy needs  Disposition: Ok for discharge home from stroke standpoint  patient has a 10-15% risk of having another stroke over the next year, the highest risk is within 2 weeks of the most recent stroke/TIA (risk of having a stroke following a stroke or TIA is the same).  Ongoing risk factor control by Primary Care Physician  Follow-up Stroke Clinic at [facility] with Dr. Rosalin Hawking in 2 months, order placed.  Hypertension  Continue lisinopril  Diabetes type II, uncontrolled  HgbA1c 9.2, goal < 7.0, previously on metformin which has been discontinued Insulin teaching, patient being discharged home on Lantus  Tobacco abuse  Current smoker  Smoking cessation counseling provided, will provide patient with a nicotine patch  Other Stroke Risk Factors  Positive THC this admission        Discharge Exam:    Blood pressure 149/85, pulse 60, temperature 98.2 F (36.8 C), temperature source Oral, resp. rate 20, height 5' 10"  (1.778 m), weight 91.4 kg (201 lb 8 oz), SpO2 100 %.     General: Pt is alert, follows commands appropriately, not in acute distress  Cardiovascular:  Regular rate and rhythm, S1/S2, no murmurs, no rubs, no gallops  Respiratory: Clear to auscultation bilaterally, no wheezing, no crackles, no rhonchi  Abdomen: Soft, non tender, non distended, bowel sounds present, no guarding  Extremities: No edema, pulses DP and PT palpable bilaterally  Neuro: L UP and LE strength 4-5/5 with slightly diminished sensation on that side      Discharge Instructions    Ambulatory referral to Neurology    Complete by:  As directed   Dr. Erlinda Hong requests followup in 2 months     Diet - low sodium heart healthy    Complete by:  As directed      Increase activity slowly    Complete by:  As directed            Follow-up Information    Follow up with Xu,Jindong, MD In 2 months.   Specialty:  Neurology   Why:  Stroke Clinic, Office will call you with appointment date & time   Contact information:   [address] [place] [address] [phone]       Follow up with PCP. Schedule an appointment as soon as possible for a visit in 3 days.      SignedReyne Dumas 01/03/2015, 10:34 AM        Time spent >45 mins

## 2015-01-15 ENCOUNTER — Encounter: Payer: Self-pay | Admitting: Family Medicine

## 2015-01-15 ENCOUNTER — Ambulatory Visit (INDEPENDENT_AMBULATORY_CARE_PROVIDER_SITE_OTHER): Payer: Self-pay | Admitting: Family Medicine

## 2015-01-15 VITALS — BP 132/65 | HR 67 | Temp 98.3°F | Resp 14 | Ht 70.0 in | Wt 200.0 lb

## 2015-01-15 DIAGNOSIS — I10 Essential (primary) hypertension: Secondary | ICD-10-CM

## 2015-01-15 DIAGNOSIS — R6889 Other general symptoms and signs: Secondary | ICD-10-CM

## 2015-01-15 DIAGNOSIS — IMO0002 Reserved for concepts with insufficient information to code with codable children: Secondary | ICD-10-CM

## 2015-01-15 DIAGNOSIS — E781 Pure hyperglyceridemia: Secondary | ICD-10-CM

## 2015-01-15 DIAGNOSIS — E1165 Type 2 diabetes mellitus with hyperglycemia: Secondary | ICD-10-CM

## 2015-01-15 DIAGNOSIS — I639 Cerebral infarction, unspecified: Secondary | ICD-10-CM

## 2015-01-15 DIAGNOSIS — Z720 Tobacco use: Secondary | ICD-10-CM

## 2015-01-15 DIAGNOSIS — Z23 Encounter for immunization: Secondary | ICD-10-CM

## 2015-01-15 DIAGNOSIS — H269 Unspecified cataract: Secondary | ICD-10-CM

## 2015-01-15 DIAGNOSIS — E118 Type 2 diabetes mellitus with unspecified complications: Secondary | ICD-10-CM

## 2015-01-15 LAB — POCT URINALYSIS DIP (DEVICE)
Bilirubin Urine: NEGATIVE
GLUCOSE, UA: NEGATIVE mg/dL
Hgb urine dipstick: NEGATIVE
Ketones, ur: NEGATIVE mg/dL
NITRITE: NEGATIVE
PROTEIN: NEGATIVE mg/dL
Specific Gravity, Urine: 1.025 (ref 1.005–1.030)
UROBILINOGEN UA: 0.2 mg/dL (ref 0.0–1.0)
pH: 5.5 (ref 5.0–8.0)

## 2015-01-15 LAB — COMPLETE METABOLIC PANEL WITH GFR
ALT: 11 U/L (ref 9–46)
AST: 13 U/L (ref 10–40)
Albumin: 4.4 g/dL (ref 3.6–5.1)
Alkaline Phosphatase: 58 U/L (ref 40–115)
BUN: 11 mg/dL (ref 7–25)
CHLORIDE: 103 mmol/L (ref 98–110)
CO2: 25 mmol/L (ref 20–31)
Calcium: 9.1 mg/dL (ref 8.6–10.3)
Creat: 0.95 mg/dL (ref 0.60–1.35)
GFR, Est African American: >89 mL/min (ref >=60)
GFR, Est Non African American: >89 mL/min (ref >=60)
GLUCOSE: 127 mg/dL — AB (ref 65–99)
POTASSIUM: 4.5 mmol/L (ref 3.5–5.3)
SODIUM: 138 mmol/L (ref 135–146)
Total Bilirubin: 0.4 mg/dL (ref 0.2–1.2)
Total Protein: 6.4 g/dL (ref 6.1–8.1)

## 2015-01-15 LAB — GLUCOSE, CAPILLARY: GLUCOSE-CAPILLARY: 145 mg/dL — AB (ref 65–99)

## 2015-01-15 LAB — TSH: TSH: 0.875 u[IU]/mL (ref 0.350–4.500)

## 2015-01-15 MED ORDER — INSULIN GLARGINE 100 UNIT/ML ~~LOC~~ SOLN: 20.0000 [IU] | Freq: Every day | SUBCUTANEOUS | Status: DC

## 2015-01-15 MED ORDER — GLUCOSE BLOOD VI STRP: ORAL_STRIP | Status: DC

## 2015-01-15 MED ORDER — TRUE METRIX AIR GLUCOSE METER DEVI: 1.0000 | Freq: Two times a day (BID) | Status: DC

## 2015-01-15 NOTE — Progress Notes (Signed)
Subjective:    Patient ID: Frank Schneider, male    DOB: Aug 13, 1975, 39 y.o.   MRN: [id number]  HPI [name], a 39 year old male with a history of diabetes mellitus type 2, hypertension and CVA presents for a hospital follow-up and to establish care. Patient states that he was utilizing the emergency department for primary care needs.  Patient was hospitalized from 9/6-9/8. He reports that he awakened with numbness and weakness to his left side and googled signs and symptoms of a stoke. He reported to the emergency department and MRI of the brain confirmed an acute ischemic stroke. Patient reports that he continues to have weakness to left side and intolerance to cold temperatures. He denies difficulty with speech, swallowing, and gait. He states that he has an appointment with eurology scheduled for November 15th.  .   [name] has a history of diabetes mellitus type 2. He was previously on Metformin, but was changed to Lantus while hospitalized. Patient maintains that he has been taking lantus consistently. He has been checking blood sugars nightly, but did not bring glucometer to evaluate average daily glucose. Patient was previously in a medication study (either placebo or empagliflozin). Patient has a participant follow-up appointment scheduled.  He currently denies fatigue, headache, irritability, polyuria, polydipsia, or polyphagia.   Patient continues to smoke tobacco. Patient is smoking 5 cigarettes per day. He state he has been smoking for 26 years.     Past Medical History  Diagnosis Date  . Hypertension   . Priapism   . Tobacco use   . Diabetes mellitus     Takes Metformin     Medication List       This list is accurate as of: 01/15/15 11:47 AM.  Always use your most recent med list.               aspirin 325 MG tablet  Take 1 tablet (325 mg total) by mouth daily.     diphenhydramine-acetaminophen 25-500 MG Tabs  Commonly known as:  TYLENOL PM  Take 2  tablets by mouth at bedtime as needed (sleep/pain).     freestyle lancets  Use as instructed     glucose monitoring kit monitoring kit  1 each by Does not apply route as needed for other.     insulin glargine 100 UNIT/ML injection  Commonly known as:  LANTUS  Inject 0.16 mLs (16 Units total) into the skin at bedtime.     INSULIN SYRINGE .5CC/30GX1/2" 30G X 1/2" 0.5 ML Misc  100     lisinopril 20 MG tablet  Commonly known as:  PRINIVIL,ZESTRIL  Take 1 tablet (20 mg total) by mouth daily.     nicotine 21 mg/24hr patch  Commonly known as:  NICODERM CQ - dosed in mg/24 hours  Place 1 patch (21 mg total) onto the skin daily.       Immunization History  Administered Date(s) Administered  . Pneumococcal Polysaccharide-23 07/29/2011  No Known Allergies Review of Systems  Constitutional: Negative for fever, fatigue and unexpected weight change.  Eyes: Positive for visual disturbance (Cataract to right eye). Negative for photophobia.  Respiratory: Negative for chest tightness and shortness of breath.   Cardiovascular: Negative.   Gastrointestinal: Negative.  Negative for nausea, abdominal pain, diarrhea and constipation.  Endocrine: Positive for cold intolerance. Negative for polydipsia, polyphagia and polyuria.  Genitourinary: Negative for urgency, frequency, decreased urine volume and testicular pain.  Musculoskeletal: Negative.   Skin: Negative.  Negative  for rash and wound.  Allergic/Immunologic: Negative for immunocompromised state.  Neurological: Positive for weakness and numbness (left side). Negative for dizziness, facial asymmetry and speech difficulty.  Hematological: Negative.   Psychiatric/Behavioral: Negative.  Negative for suicidal ideas and sleep disturbance.       Objective:   Physical Exam  Constitutional: He appears well-developed and well-nourished.  HENT:  Head: Normocephalic.  Right Ear: Hearing, tympanic membrane, external ear and ear canal normal.    Left Ear: Hearing, tympanic membrane, external ear and ear canal normal.  Nose: Nose normal.  Mouth/Throat: Uvula is midline, oropharynx is clear and moist and mucous membranes are normal.  Eyes: Conjunctivae and EOM are normal. Right pupil is reactive.    Neck: Trachea normal. No rigidity. Normal range of motion present.  Cardiovascular: Normal rate, regular rhythm, S1 normal, S2 normal, intact distal pulses and normal pulses.   Pulmonary/Chest: Effort normal and breath sounds normal.  Abdominal: Soft. Normal appearance and bowel sounds are normal.  Musculoskeletal: Normal range of motion.  Neurological: He is alert. He has normal reflexes. He is not disoriented. No cranial nerve deficit. He exhibits normal muscle tone.  Monofilament examination within normal limits  Skin: Skin is warm, dry and intact.  Toenails thickened with mild hyperpigmentation  Psychiatric: He has a normal mood and affect. His speech is normal and behavior is normal. Judgment and thought content normal. Cognition and memory are normal.       BP 132/65 mmHg  Pulse 67  Temp(Src) 98.3 F (36.8 C) (Oral)  Resp 14  Ht _0  (1.778 m)  Wt 200 lb (90.719 kg)  BMI 28.70 kg/m2 Assessment & Plan:    1. Diabetes mellitus type 2 with complications, uncontrolled Reviewed previous labs, hemoglobin A1C is 9.2, will increase Lantus to 20 units HS. Patient will check blood sugars AC/HS. Discussed carbohydrates at length. Recommend a lowfat, low carbohydrate diet divided over 5-6 small meals, increase water intake to 6-8 glasses, and 150 minutes per week of cardiovascular exercise.  Recommended that patient not continue drug study participation due to uncontrolled diabetes mellitus.  - Glucose (CBG) - POCT urinalysis dipstick - COMPLETE METABOLIC PANEL WITH GFR - CBC with Differential - insulin glargine (LANTUS) 100 UNIT/ML injection; Inject 0.2 mLs (20 Units total) into the skin at bedtime.  Dispense: 10 mL; Refill:  11 - Blood Glucose Monitoring Suppl (TRUE METRIX AIR GLUCOSE METER) DEVI; 1 each by Does not apply route 2 (two) times daily at 10 AM and 5 PM.  Dispense: 1 Device; Refill: 0 - glucose blood (TRUE METRIX BLOOD GLUCOSE TEST) test strip; Use as instructed  Dispense: 100 each; Refill: 12  2. Essential hypertension Blood pressure is at goal on current medication regimen. Patient to continue Lisinopril as previously prescribed.  - POCT urinalysis dipstick  3. CVA (cerebral vascular accident)  Patient will continue Aspirin therapy per neurologist. He is to follow-up with Dr. Lavera Guise as scheduled.   4. 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 Tobacco use Patient is in the precontemplative stage. Sent referral to [facility]. Smoking cessation instruction/counseling given:  counseled patient on the dangers of tobacco use, advised patient to stop smoking, and reviewed strategies to maximize success  6. Hypertriglyceridemia Reviewed lipid panel. Triglycerides are 264, goal is <150. Will schedule a fasting lipid panel 1 week prior to follow up appointment  7. Right cataract Patient has a history of a right cataract. He states that he has not had it removed due to financial  constraints. Will send a referral to opthalmologist  8. Need for Tdap vaccination  - Tdap vaccine greater than or equal to 7yo IM  9. Need for prophylactic vaccination and inoculation against influenza  - Flu Vaccine QUAD 36+ mos IM (Fluarix & Fluzone Quad PF   Joncarlo Friberg M, FNP  The patient was given clear instructions to go to ER or return to medical center if symptoms do not improve, worsen or new problems develop. The patient verbalized understanding. Will notify patient with laboratory results.

## 2015-01-15 NOTE — Patient Instructions (Addendum)
Test blood sugars twice daily (am and pm)  Diabetes and Foot Care Diabetes may cause you to have problems because of poor blood supply (circulation) to your feet and legs. This may cause the skin on your feet to become thinner, break easier, and heal more slowly. Your skin may become dry, and the skin may peel and crack. You may also have nerve damage in your legs and feet causing decreased feeling in them. You may not notice minor injuries to your feet that could lead to infections or more serious problems. Taking care of your feet is one of the most important things you can do for yourself.  HOME CARE INSTRUCTIONS  Wear shoes at all times, even in the house. Do not go barefoot. Bare feet are easily injured.  Check your feet daily for blisters, cuts, and redness. If you cannot see the bottom of your feet, use a mirror or ask someone for help.  Wash your feet with warm water (do not use hot water) and mild soap. Then pat your feet and the areas between your toes until they are completely dry. Do not soak your feet as this can dry your skin.  Apply a moisturizing lotion or petroleum jelly (that does not contain alcohol and is unscented) to the skin on your feet and to dry, brittle toenails. Do not apply lotion between your toes.  Trim your toenails straight across. Do not dig under them or around the cuticle. File the edges of your nails with an emery board or nail file.  Do not cut corns or calluses or try to remove them with medicine.  Wear clean socks or stockings every day. Make sure they are not too tight. Do not wear knee-high stockings since they may decrease blood flow to your legs.  Wear shoes that fit properly and have enough cushioning. To break in new shoes, wear them for just a few hours a day. This prevents you from injuring your feet. Always look in your shoes before you put them on to be sure there are no objects inside.  Do not cross your legs. This may decrease the blood flow  to your feet.  If you find a minor scrape, cut, or break in the skin on your feet, keep it and the skin around it clean and dry. These areas may be cleansed with mild soap and water. Do not cleanse the area with peroxide, alcohol, or iodine.  When you remove an adhesive bandage, be sure not to damage the skin around it.  If you have a wound, look at it several times a day to make sure it is healing.  Do not use heating pads or hot water bottles. They may burn your skin. If you have lost feeling in your feet or legs, you may not know it is happening until it is too late.  Make sure your health care provider performs a complete foot exam at least annually or more often if you have foot problems. Report any cuts, sores, or bruises to your health care provider immediately. SEEK MEDICAL CARE IF:   You have an injury that is not healing.  You have cuts or breaks in the skin.  You have an ingrown nail.  You notice redness on your legs or feet.  You feel burning or tingling in your legs or feet.  You have pain or cramps in your legs and feet.  Your legs or feet are numb.  Your feet always feel cold. SEEK IMMEDIATE  MEDICAL CARE IF:   There is increasing redness, swelling, or pain in or around a wound.  There is a red line that goes up your leg.  Pus is coming from a wound.  You develop a fever or as directed by your health care provider.  You notice a bad smell coming from an ulcer or wound. Document Released: 04/10/2000 Document Revised: 12/14/2012 Document Reviewed: 09/20/2012 [facility] Patient Information 2015 [place], [place]. This information is not intended to replace advice given to you by your health care provider. Make sure you discuss any questions you have with your health care provider. Diabetes and Standards of Medical Care Diabetes is complicated. You may find that your diabetes team includes a dietitian, nurse, diabetes educator, eye doctor, and more. To help everyone  know what is going on and to help you get the care you deserve, the following schedule of care was developed to help keep you on track. Below are the tests, exams, vaccines, medicines, education, and plans you will need. HbA1c test This test shows how well you have controlled your glucose over the past 2-3 months. It is used to see if your diabetes management plan needs to be adjusted.   It is performed at least 2 times a year if you are meeting treatment goals.  It is performed 4 times a year if therapy has changed or if you are not meeting treatment goals. Blood pressure test  This test is performed at every routine medical visit. The goal is less than 140/90 mm Hg for most people, but 130/80 mm Hg in some cases. Ask your health care provider about your goal. Dental exam  Follow up with the dentist regularly. Eye exam  If you are diagnosed with type 1 diabetes as a child, get an exam upon reaching the age of 93 years or older and have had diabetes for 3-5 years. Yearly eye exams are recommended after that initial eye exam.  If you are diagnosed with type 1 diabetes as an adult, get an exam within 5 years of diagnosis and then yearly.  If you are diagnosed with type 2 diabetes, get an exam as soon as possible after the diagnosis and then yearly. Foot care exam  Visual foot exams are performed at every routine medical visit. The exams check for cuts, injuries, or other problems with the feet.  A comprehensive foot exam should be done yearly. This includes visual inspection as well as assessing foot pulses and testing for loss of sensation.  Check your feet nightly for cuts, injuries, or other problems with your feet. Tell your health care provider if anything is not healing. Kidney function test (urine microalbumin)  This test is performed once a year.  Type 1 diabetes: The first test is performed 5 years after diagnosis.  Type 2 diabetes: The first test is performed at the time of  diagnosis.  A serum creatinine and estimated glomerular filtration rate (eGFR) test is done once a year to assess the level of chronic kidney disease (CKD), if present. Lipid profile (cholesterol, HDL, LDL, triglycerides)  Performed every 5 years for most people.  The goal for LDL is less than 100 mg/dL. If you are at high risk, the goal is less than 70 mg/dL.  The goal for HDL is 40 mg/dL-50 mg/dL for men and 50 mg/dL-60 mg/dL for women. An HDL cholesterol of 60 mg/dL or higher gives some protection against heart disease.  The goal for triglycerides is less than 150 mg/dL. Influenza  vaccine, pneumococcal vaccine, and hepatitis B vaccine  The influenza vaccine is recommended yearly.  It is recommended that people with diabetes who are over 35 years old get the pneumonia vaccine. In some cases, two separate shots may be given. Ask your health care provider if your pneumonia vaccination is up to date.  The hepatitis B vaccine is also recommended for adults with diabetes. Diabetes self-management education  Education is recommended at diagnosis and ongoing as needed. Treatment plan  Your treatment plan is reviewed at every medical visit. Document Released: 02/08/2009 Document Revised: 08/28/2013 Document Reviewed: 09/13/2012 [facility] Patient Information 2015 [place], [place]. This information is not intended to replace advice given to you by your health care provider. Make sure you discuss any questions you have with your health care provider. DASH Eating Plan DASH stands for "Dietary Approaches to Stop Hypertension." The DASH eating plan is a healthy eating plan that has been shown to reduce high blood pressure (hypertension). Additional health benefits may include reducing the risk of type 2 diabetes mellitus, heart disease, and stroke. The DASH eating plan may also help with weight loss. WHAT DO I NEED TO KNOW ABOUT THE DASH EATING PLAN? For the DASH eating plan, you will follow these  general guidelines:  Choose foods with a percent daily value for sodium of less than 5% (as listed on the food label).  Use salt-free seasonings or herbs instead of table salt or sea salt.  Check with your health care provider or pharmacist before using salt substitutes.  Eat lower-sodium products, often labeled as "lower sodium" or "no salt added."  Eat fresh foods.  Eat more vegetables, fruits, and low-fat dairy products.  Choose whole grains. Look for the word "whole" as the first word in the ingredient list.  Choose fish and skinless chicken or [place] more often than red meat. Limit fish, poultry, and meat to 6 oz (170 g) each day.  Limit sweets, desserts, sugars, and sugary drinks.  Choose heart-healthy fats.  Limit cheese to 1 oz (28 g) per day.  Eat more home-cooked food and less restaurant, buffet, and fast food.  Limit fried foods.  Cook foods using methods other than frying.  Limit canned vegetables. If you do use them, rinse them well to decrease the sodium.  When eating at a restaurant, ask that your food be prepared with less salt, or no salt if possible. WHAT FOODS CAN I EAT? Seek help from a dietitian for individual calorie needs. Grains Whole grain or whole wheat bread. Brown rice. Whole grain or whole wheat pasta. Quinoa, bulgur, and whole grain cereals. Low-sodium cereals. Corn or whole wheat flour tortillas. Whole grain cornbread. Whole grain crackers. Low-sodium crackers. Vegetables Fresh or frozen vegetables (raw, steamed, roasted, or grilled). Low-sodium or reduced-sodium tomato and vegetable juices. Low-sodium or reduced-sodium tomato sauce and paste. Low-sodium or reduced-sodium canned vegetables.  Fruits All fresh, canned (in natural juice), or frozen fruits. Meat and Other Protein Products Ground beef (85% or leaner), grass-fed beef, or beef trimmed of fat. Skinless chicken or [place]. Ground chicken or [place]. Pork trimmed of fat. All fish and  seafood. Eggs. Dried beans, peas, or lentils. Unsalted nuts and seeds. Unsalted canned beans. Dairy Low-fat dairy products, such as skim or 1% milk, 2% or reduced-fat cheeses, low-fat ricotta or cottage cheese, or plain low-fat yogurt. Low-sodium or reduced-sodium cheeses. Fats and Oils Tub margarines without trans fats. Light or reduced-fat mayonnaise and salad dressings (reduced sodium). Avocado. Safflower, olive, or canola oils. Natural  peanut or almond butter. Other Unsalted popcorn and pretzels. The items listed above may not be a complete list of recommended foods or beverages. Contact your dietitian for more options. WHAT FOODS ARE NOT RECOMMENDED? Grains White bread. White pasta. White rice. Refined cornbread. Bagels and croissants. Crackers that contain trans fat. Vegetables Creamed or fried vegetables. Vegetables in a cheese sauce. Regular canned vegetables. Regular canned tomato sauce and paste. Regular tomato and vegetable juices. Fruits Dried fruits. Canned fruit in light or heavy syrup. Fruit juice. Meat and Other Protein Products Fatty cuts of meat. Ribs, chicken wings, bacon, sausage, bologna, salami, chitterlings, fatback, hot dogs, bratwurst, and packaged luncheon meats. Salted nuts and seeds. Canned beans with salt. Dairy Whole or 2% milk, cream, half-and-half, and cream cheese. Whole-fat or sweetened yogurt. Full-fat cheeses or blue cheese. Nondairy creamers and whipped toppings. Processed cheese, cheese spreads, or cheese curds. Condiments Onion and garlic salt, seasoned salt, table salt, and sea salt. Canned and packaged gravies. Worcestershire sauce. Tartar sauce. Barbecue sauce. Teriyaki sauce. Soy sauce, including reduced sodium. Steak sauce. Fish sauce. Oyster sauce. Cocktail sauce. Horseradish. Ketchup and mustard. Meat flavorings and tenderizers. Bouillon cubes. Hot sauce. Tabasco sauce. Marinades. Taco seasonings. Relishes. Fats and Oils Butter, stick margarine,  lard, shortening, ghee, and bacon fat. Coconut, palm kernel, or palm oils. Regular salad dressings. Other Pickles and olives. Salted popcorn and pretzels. The items listed above may not be a complete list of foods and beverages to avoid. Contact your dietitian for more information. WHERE CAN I FIND MORE INFORMATION? National Heart, Lung, and Blood Institute: travelstabloid.com Document Released: 04/02/2011 Document Revised: 08/28/2013 Document Reviewed: 02/15/2013 [facility] Patient Information 2015 [place], [place]. This information is not intended to replace advice given to you by your health care provider. Make sure you discuss any questions you have with your health care provider. Hypertension Hypertension, commonly called high blood pressure, is when the force of blood pumping through your arteries is too strong. Your arteries are the blood vessels that carry blood from your heart throughout your body. A blood pressure reading consists of a higher number over a lower number, such as 110/72. The higher number (systolic) is the pressure inside your arteries when your heart pumps. The lower number (diastolic) is the pressure inside your arteries when your heart relaxes. Ideally you want your blood pressure below 120/80. Hypertension forces your heart to work harder to pump blood. Your arteries may become narrow or stiff. Having hypertension puts you at risk for heart disease, stroke, and other problems.  RISK FACTORS Some risk factors for high blood pressure are controllable. Others are not.  Risk factors you cannot control include:   Race. You may be at higher risk if you are African American.  Age. Risk increases with age.  Gender. Men are at higher risk than women before age 74 years. After age 65, women are at higher risk than men. Risk factors you can control include:  Not getting enough exercise or physical activity.  Being overweight.  Getting too much  fat, sugar, calories, or salt in your diet.  Drinking too much alcohol. SIGNS AND SYMPTOMS Hypertension does not usually cause signs or symptoms. Extremely high blood pressure (hypertensive crisis) may cause headache, anxiety, shortness of breath, and nosebleed. DIAGNOSIS  To check if you have hypertension, your health care provider will measure your blood pressure while you are seated, with your arm held at the level of your heart. It should be measured at least twice using the same  arm. Certain conditions can cause a difference in blood pressure between your right and left arms. A blood pressure reading that is higher than normal on one occasion does not mean that you need treatment. If one blood pressure reading is high, ask your health care provider about having it checked again. TREATMENT  Treating high blood pressure includes making lifestyle changes and possibly taking medicine. Living a healthy lifestyle can help lower high blood pressure. You may need to change some of your habits. Lifestyle changes may include:  Following the DASH diet. This diet is high in fruits, vegetables, and whole grains. It is low in salt, red meat, and added sugars.  Getting at least 2 hours of brisk physical activity every week.  Losing weight if necessary.  Not smoking.  Limiting alcoholic beverages.  Learning ways to reduce stress. If lifestyle changes are not enough to get your blood pressure under control, your health care provider may prescribe medicine. You may need to take more than one. Work closely with your health care provider to understand the risks and benefits. HOME CARE INSTRUCTIONS  Have your blood pressure rechecked as directed by your health care provider.   Take medicines only as directed by your health care provider. Follow the directions carefully. Blood pressure medicines must be taken as prescribed. The medicine does not work as well when you skip doses. Skipping doses also puts  you at risk for problems.   Do not smoke.   Monitor your blood pressure at home as directed by your health care provider. SEEK MEDICAL CARE IF:   You think you are having a reaction to medicines taken.  You have recurrent headaches or feel dizzy.  You have swelling in your ankles.  You have trouble with your vision. SEEK IMMEDIATE MEDICAL CARE IF:  You develop a severe headache or confusion.  You have unusual weakness, numbness, or feel faint.  You have severe chest or abdominal pain.  You vomit repeatedly.  You have trouble breathing. MAKE SURE YOU:   Understand these instructions.  Will watch your condition.  Will get help right away if you are not doing well or get worse. Document Released: 04/13/2005 Document Revised: 08/28/2013 Document Reviewed: 02/03/2013 [facility] Patient Information 2015 [place], [place]. This information is not intended to replace advice given to you by your health care provider. Make sure you discuss any questions you have with your health care provider.

## 2015-01-16 ENCOUNTER — Other Ambulatory Visit: Payer: Self-pay

## 2015-01-16 ENCOUNTER — Telehealth: Payer: Self-pay | Admitting: Family Medicine

## 2015-01-16 DIAGNOSIS — D509 Iron deficiency anemia, unspecified: Secondary | ICD-10-CM

## 2015-01-16 LAB — CBC WITH DIFFERENTIAL/PLATELET
Basophils Absolute: 0 10*3/uL (ref 0.0–0.1)
Basophils Relative: 0 % (ref 0–1)
Eosinophils Absolute: 0.2 10*3/uL (ref 0.0–0.7)
Eosinophils Relative: 2 % (ref 0–5)
HEMATOCRIT: 37.6 % — AB (ref 39.0–52.0)
HEMOGLOBIN: 11.9 g/dL — AB (ref 13.0–17.0)
LYMPHS ABS: 2.4 10*3/uL (ref 0.7–4.0)
LYMPHS PCT: 28 % (ref 12–46)
MCH: 19.7 pg — AB (ref 26.0–34.0)
MCHC: 31.6 g/dL (ref 30.0–36.0)
MCV: 62.1 fL — AB (ref 78.0–100.0)
MONO ABS: 0.5 10*3/uL (ref 0.1–1.0)
MONOS PCT: 6 % (ref 3–12)
NEUTROS ABS: 5.6 10*3/uL (ref 1.7–7.7)
Neutrophils Relative %: 64 % (ref 43–77)
Platelets: 211 10*3/uL (ref 150–400)
RBC: 6.05 MIL/uL — ABNORMAL HIGH (ref 4.22–5.81)
RDW: 18.3 % — ABNORMAL HIGH (ref 11.5–15.5)
WBC: 8.7 10*3/uL (ref 4.0–10.5)

## 2015-01-16 MED ORDER — FERROUS SULFATE 325 (65 FE) MG PO TABS: 325.0000 mg | ORAL_TABLET | Freq: Every day | ORAL | Status: DC

## 2015-01-16 MED ORDER — LISINOPRIL 20 MG PO TABS: 20.0000 mg | ORAL_TABLET | Freq: Every day | ORAL | Status: DC

## 2015-01-16 NOTE — Telephone Encounter (Signed)
When placing call to patient to advise of labs. Patient requested a refill on lisinopril. This has been sent in. Thanks!

## 2015-01-16 NOTE — Telephone Encounter (Signed)
Reviewed labs, hemoglobin 11.9 and MCV, MCHC decreased. Will start daily iron replacement daily with breakfast. Recommend a low carbohydrate, high fiber diet, increase water intake. Will check CBC at previously scheduled follow-up appointment.    Meds ordered this encounter  Medications  . ferrous sulfate 325 (65 FE) MG tablet    Sig: Take 1 tablet (325 mg total) by mouth daily with breakfast.    Dispense:  30 tablet    Refill:  3   Hollis,Lachina M, FNP

## 2015-01-16 NOTE — Telephone Encounter (Signed)
Called and advised patient of need to start iron daily with breakfast. Asked patient to keep follow up appointment. Patient denied any questions at this time. Thanks!

## 2015-01-30 ENCOUNTER — Telehealth: Payer: Self-pay | Admitting: Family Medicine

## 2015-01-30 NOTE — Telephone Encounter (Signed)
Received faxed request for Medical Records. Forwarded to [employer].

## 2015-02-14 ENCOUNTER — Ambulatory Visit (INDEPENDENT_AMBULATORY_CARE_PROVIDER_SITE_OTHER): Payer: Self-pay | Admitting: Family Medicine

## 2015-02-14 VITALS — BP 136/79 | HR 66 | Temp 98.1°F | Resp 16 | Ht 70.5 in | Wt 200.0 lb

## 2015-02-14 DIAGNOSIS — I10 Essential (primary) hypertension: Secondary | ICD-10-CM

## 2015-02-14 DIAGNOSIS — E118 Type 2 diabetes mellitus with unspecified complications: Secondary | ICD-10-CM

## 2015-02-14 DIAGNOSIS — Z720 Tobacco use: Secondary | ICD-10-CM

## 2015-02-14 DIAGNOSIS — E785 Hyperlipidemia, unspecified: Secondary | ICD-10-CM | POA: Insufficient documentation

## 2015-02-14 DIAGNOSIS — IMO0002 Reserved for concepts with insufficient information to code with codable children: Secondary | ICD-10-CM

## 2015-02-14 DIAGNOSIS — Z8673 Personal history of transient ischemic attack (TIA), and cerebral infarction without residual deficits: Secondary | ICD-10-CM | POA: Insufficient documentation

## 2015-02-14 DIAGNOSIS — E1165 Type 2 diabetes mellitus with hyperglycemia: Secondary | ICD-10-CM

## 2015-02-14 LAB — POCT URINALYSIS DIP (DEVICE)
BILIRUBIN URINE: NEGATIVE
GLUCOSE, UA: NEGATIVE mg/dL
KETONES UR: NEGATIVE mg/dL
Leukocytes, UA: NEGATIVE
NITRITE: NEGATIVE
Protein, ur: NEGATIVE mg/dL
Specific Gravity, Urine: 1.025 (ref 1.005–1.030)
Urobilinogen, UA: 0.2 mg/dL (ref 0.0–1.0)
pH: 5.5 (ref 5.0–8.0)

## 2015-02-14 LAB — GLUCOSE, CAPILLARY: GLUCOSE-CAPILLARY: 188 mg/dL — AB (ref 65–99)

## 2015-02-14 MED ORDER — ASPIRIN 325 MG PO TABS: 325.0000 mg | ORAL_TABLET | Freq: Every day | ORAL | Status: DC

## 2015-02-14 NOTE — Progress Notes (Signed)
Subjective:    Patient ID: Frank Schneider, male    DOB: 27-Jun-1975, 39 y.o.   MRN: [id number]  Diabetes He presents for his follow-up diabetic visit. He has type 2 diabetes mellitus. His disease course has been stable. Pertinent negatives for hypoglycemia include no confusion, dizziness, pallor, seizures, speech difficulty, sweats or tremors. Associated symptoms include weakness. Pertinent negatives for diabetes include no blurred vision, no chest pain, no fatigue, no foot ulcerations, no polydipsia, no polyphagia and no polyuria. Pertinent negatives for hypoglycemia complications include no blackouts, no nocturnal hypoglycemia and no required glucagon injection. Symptoms are stable. He is following a diabetic diet. He has not had a previous visit with a dietitian. He rarely participates in exercise. An ACE inhibitor/angiotensin II receptor blocker is not being taken. He does not see a podiatrist.Eye exam is not current.  Hypertension The current episode started more than 1 month ago. The problem is unchanged. Pertinent negatives include no blurred vision, chest pain, neck pain, orthopnea, palpitations, shortness of breath or sweats. The current treatment provides significant improvement.   [name], a 39 year old male with a history of diabetes mellitus type 2, hypertension and CVA presents for a 1 month follow up of diabetes and hypertension. He states that he has been taking medications consistently. He did not take anti-hypertensive medication this am;  he maintains that he did not stay at home last night. Daily blood sugar ranges between 120-140 upon awakening. He has been taking Lantus consistently.  Past Medical History  Diagnosis Date  . Hypertension   . Priapism   . Tobacco use   . Diabetes mellitus     Takes Metformin     Medication List       This list is accurate as of: 02/14/15 10:44 AM.  Always use your most recent med list.               aspirin 325 MG tablet   Take 1 tablet (325 mg total) by mouth daily.     diphenhydramine-acetaminophen 25-500 MG Tabs tablet  Commonly known as:  TYLENOL PM  Take 2 tablets by mouth at bedtime as needed (sleep/pain).     ferrous sulfate 325 (65 FE) MG tablet  Take 1 tablet (325 mg total) by mouth daily with breakfast.     freestyle lancets  Use as instructed     glucose blood test strip  Commonly known as:  TRUE METRIX BLOOD GLUCOSE TEST  Use as instructed     insulin glargine 100 UNIT/ML injection  Commonly known as:  LANTUS  Inject 0.2 mLs (20 Units total) into the skin at bedtime.     INSULIN SYRINGE .5CC/30GX1/2" 30G X 1/2" 0.5 ML Misc  100     lisinopril 20 MG tablet  Commonly known as:  PRINIVIL,ZESTRIL  Take 1 tablet (20 mg total) by mouth daily.     nicotine 21 mg/24hr patch  Commonly known as:  NICODERM CQ - dosed in mg/24 hours  Place 1 patch (21 mg total) onto the skin daily.     TRUE METRIX AIR GLUCOSE METER Devi  1 each by Does not apply route 2 (two) times daily at 10 AM and 5 PM.       Immunization History  Administered Date(s) Administered  . Influenza,inj,Quad PF,36+ Mos 01/15/2015  . Pneumococcal Polysaccharide-23 07/29/2011  . Tdap 01/15/2015  No Known Allergies Review of Systems  Constitutional: Negative for fever, fatigue and unexpected weight change.  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 Negative for blurred vision and photophobia.  Respiratory: Negative for chest tightness and shortness of breath.   Cardiovascular: Negative.  Negative for chest pain, palpitations and orthopnea.  Gastrointestinal: Negative.  Negative for nausea, abdominal pain, diarrhea and constipation.  Endocrine: Positive for cold intolerance. Negative for polydipsia, polyphagia and polyuria.  Genitourinary: Negative for urgency, frequency, decreased urine volume and testicular pain.  Musculoskeletal: Negative.  Negative for neck pain.  Skin: Negative.  Negative for  pallor, rash and wound.  Allergic/Immunologic: Negative for immunocompromised state.  Neurological: Positive for weakness and numbness (left side). Negative for dizziness, tremors, seizures, facial asymmetry and speech difficulty.  Hematological: Negative.   Psychiatric/Behavioral: Negative.  Negative for suicidal ideas, confusion and sleep disturbance.       Objective:   Physical Exam  Constitutional: He appears well-developed and well-nourished.  HENT:  Head: Normocephalic.  Right Ear: Hearing, tympanic membrane, external ear and ear canal normal.  Left Ear: Hearing, tympanic membrane, external ear and ear canal normal.  Nose: Nose normal.  Mouth/Throat: Uvula is midline, oropharynx is clear and moist and mucous membranes are normal.  Eyes: Conjunctivae, EOM and lids are normal. Right pupil is reactive.  Neck: Trachea normal. No rigidity. Normal range of motion present.  Cardiovascular: Normal rate, regular rhythm, S1 normal, S2 normal, intact distal pulses and normal pulses.   Pulmonary/Chest: Effort normal and breath sounds normal.  Abdominal: Soft. Normal appearance and bowel sounds are normal.  Musculoskeletal: Normal range of motion.  Neurological: He is alert. He has normal reflexes. He is not disoriented. No cranial nerve deficit. He exhibits normal muscle tone.  Monofilament examination within normal limits  Skin: Skin is warm, dry and intact.  Toenails thickened with mild hyperpigmentation  Psychiatric: He has a normal mood and affect. His speech is normal and behavior is normal. Judgment and thought content normal. Cognition and memory are normal.      BP 136/79 mmHg  Pulse 66  Temp(Src) 98.1 F (36.7 C) (Oral)  Resp 16  Ht 5' 10.5" (1.791 m)  Wt 200 lb (90.719 kg)  BMI 28.28 kg/m2 Assessment & Plan:  1. Uncontrolled type 2 diabetes mellitus with complication, without long-term current use of insulin (HCC) Range between 120-140 upon awakening. Will continue Lantus  at current dosage. Reviewed urinalysis, no glucosuria present.  - Glucose (CBG) - POCT urinalysis dipstick - Glucose, capillary - POCT urinalysis dip (device) - Hemoglobin A1c; Future  2. Essential hypertension Blood pressure is at goal on current medication regimen. Will continue as prescribed.  nalysis dipstick - POCT urinalysis dip (device)  3. Hyperlipidemia Reviewed lipid panel from hospital stay. Will check fasting lipid panel 1 week prior to follow-up appointment.  - Lipid Panel; Future  4. History of CVA (cerebrovascular accident) Continue  Aspirin therapy daily. Discussed symptoms of CVA at length.  - aspirin 325 MG tablet; Take 1 tablet (325 mg total) by mouth daily.  Dispense: 30 tablet; Refill: 1  5. Tobacco use .Smoking cessation instruction/counseling given:  counseled patient on the dangers of tobacco use, advised patient to stop smoking, and reviewed strategies to maximize success    RTC: 3 months for diabetes and hypertension Karter Hellmer M, FNP  The patient was given clear instructions to go to ER or return to medical center if symptoms do not improve, worsen or new problems develop. The patient verbalized understanding. Will notify patient with laboratory results.

## 2015-02-15 ENCOUNTER — Encounter: Payer: Self-pay | Admitting: Family Medicine

## 2015-03-12 ENCOUNTER — Encounter: Payer: Self-pay | Admitting: Neurology

## 2015-03-12 ENCOUNTER — Ambulatory Visit (INDEPENDENT_AMBULATORY_CARE_PROVIDER_SITE_OTHER): Payer: Self-pay | Admitting: Neurology

## 2015-03-12 VITALS — BP 142/85 | HR 69 | Ht 70.0 in | Wt 206.0 lb

## 2015-03-12 DIAGNOSIS — I63311 Cerebral infarction due to thrombosis of right middle cerebral artery: Secondary | ICD-10-CM

## 2015-03-12 DIAGNOSIS — I10 Essential (primary) hypertension: Secondary | ICD-10-CM

## 2015-03-12 DIAGNOSIS — E1159 Type 2 diabetes mellitus with other circulatory complications: Secondary | ICD-10-CM | POA: Insufficient documentation

## 2015-03-12 MED ORDER — GABAPENTIN 100 MG PO CAPS: 100.0000 mg | ORAL_CAPSULE | Freq: Three times a day (TID) | ORAL | Status: DC

## 2015-03-12 NOTE — Progress Notes (Signed)
STROKE NEUROLOGY FOLLOW UP NOTE  NAME: Frank Schneider DOB: [birth year]  REASON FOR VISIT: stroke follow up HISTORY FROM: pt and chart  Today we had the pleasure of seeing Frank Schneider in follow-up at our Neurology Clinic. Pt was accompanied by no one.   History Summary Frank Schneider is a 39 y.o. male with history of diabetes and hypertension presenting with left sided numbness and weakness. MRI showed right thalamic infarct likely due to small vessel disease. CTA head and neck, 2-D echo were unremarkable. LDL 27 and A1c 9.2. Frank Schneider was discharged with aspirin 325. Recommended PCP follow-up for diabetes control and quit smoking.  Interval History During the interval time, the patient has been doing well. No stroke like symptom. Still complain of left UE and LE sensory change, sometimes hot or cold, and sometimes painful at night. On insulin, at home glucose usually 130-150. BP today 142/85 in clinic. At home BP 120-140.  REVIEW OF SYSTEMS: Full 14 system review of systems performed and notable only for those listed below and in HPI above, all others are negative:  Constitutional:  Fatigue Cardiovascular:  Ear/Nose/Throat:   Skin:  Eyes:  Loss of vision, eye pain Respiratory:   Gastroitestinal:   Genitourinary:  Hematology/Lymphatic:   Endocrine: Feeling cold, feeling hot Musculoskeletal:   Allergy/Immunology:   Neurological:  Memory loss, headache, numbness, weakness, dizziness Psychiatric: Not enough sleep, decreased energy Sleep:   The following represents the patient's updated allergies and side effects list: No Known Allergies  The neurologically relevant items on the patient's problem list were reviewed on today's visit.  Neurologic Examination  A problem focused neurological exam (12 or more points of the single system neurologic examination, vital signs counts as 1 point, cranial nerves count for 8 points) was performed.  Blood pressure 142/85, pulse 69, height  5\' 10"  (1.778 m), weight 206 lb (93.441 kg).  General - Well nourished, well developed, in no apparent distress.  Ophthalmologic - Sharp disc margins OU.   Cardiovascular - Regular rate and rhythm.  Mental Status -  Level of arousal and orientation to time, place, and person were intact. Language including expression, naming, repetition, comprehension was assessed and found intact. Fund of Knowledge was assessed and was intact.  Cranial Nerves II - XII - II - Visual field intact OU. Left eye visual acuity only for light, not able to see HW.  III, IV, VI - Extraocular movements intact. Left lens clouding due to cataract s/p trauma V - Facial sensation decreased on the left, 65% of right. VII - Facial movement intact bilaterally. VIII - Hearing & vestibular intact bilaterally. X - Palate elevates symmetrically. XI - Chin turning & shoulder shrug intact bilaterally. XII - Tongue protrusion intact.  Motor Strength - The patient's strength was normal in all extremities except left hand dexterity difficulty and pronator drift was absent.  Bulk was normal and fasciculations were absent.   Motor Tone - Muscle tone was assessed at the neck and appendages and was normal.  Reflexes - The patient's reflexes were 1+ in all extremities and Frank Schneider had no pathological reflexes.  Sensory - Light touch, temperature/pinprick were assessed and was decreased on the left, 65% of right.   Coordination - The patient had normal movements in the hands and feet with no ataxia or dysmetria.  Tremor was absent.  Gait and Station - The patient's transfers, posture, gait, station, and turns were observed as normal.  Data reviewed: I personally reviewed the images and agree  with the radiology interpretations.  Ct Head Wo Contrast 01/01/2015 No acute intracranial pathology.   Mr Brain Wo Contrast 01/01/2015 1. Acute 7 x 7 x 11 mm ischemic infarct within the right thalamus. No associated hemorrhage or  significant mass effect. 2. No other acute intracranial process identified.   CTA head and neck  1. No acute arterial finding or flow limiting stenosis to explain the right thalamoperforator infarct. 2. Mild cervical and intracranial carotid atherosclerosis. 3. 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  2D echo - - Left ventricle: The cavity size was normal. Wall thickness wasincreased in a pattern of moderate LVH. Systolic function wasnormal. The estimated ejection fraction was in the rate of 60%to 65%. Wall motion was normal; there were no regional wallmotion abnormalities. Left ventricular diastolic functionparameters were normal. - Mitral valve: There was mild regurgitation. Impressions: No cardiac source of emboli was indentified.  Component     Latest Ref Rng 01/01/2015 01/02/2015 01/15/2015  Cholesterol     0 - 200 mg/dL 108    Triglycerides     <150 mg/dL 268 (H)    HDL Cholesterol     >40 mg/dL 27 (L)    Total CHOL/HDL Ratio      4.0    VLDL     0 - 40 mg/dL 54 (H)    LDL (calc)     0 - 99 mg/dL 27    Hemoglobin A1C     4.8 - 5.6 % 9.2 (H)    Mean Plasma Glucose      217    HIV     Non Reactive  Non Reactive   TSH     0.350 - 4.500 uIU/mL   0.875    Assessment: As you may recall, Frank Schneider is a 39 y.o. African American male with PMH of diabetes and hypertension presenting with left sided numbness and weakness. MRI showed right thalamic infarct likely due to small vessel disease. CTA head and neck, 2-D echo were unremarkable. LDL 27 and A1c 9.2. Frank Schneider was discharged with aspirin 325. Recommended PCP follow-up for diabetes control and quit smoking. During the interval time, still complain of left UE and LE sensory deficit. Has not quit smoking yet.  Plan:  - continue ASA for stroke prevention - check BP and glucose at home - Follow up with your primary care physician for stroke risk factor modification. Recommend maintain blood pressure  goal <130/80, diabetes with hemoglobin A1c goal below 6.5% and lipids with LDL cholesterol goal below 70 mg/dL.  - Prescribed gabapentin 100mg  three times a day for left sided numbness tingling - quit smoking - follow up in 3 months.  I spent more than 25 minutes of face to face time with the patient. Greater than 50% of time was spent in counseling and coordination of care. We have discussed about diabetes control, quit smoking, and home exercises.  No orders of the defined types were placed in this encounter.    Meds ordered this encounter  Medications  . glucose blood test strip    Sig:     Refill:  12  . TRUEPLUS INSULIN SYRINGE 30G X 5/16" 1 ML MISC    Sig:     Refill:  0  . gabapentin (NEURONTIN) 100 MG capsule    Sig: Take 1 capsule (100 mg total) by mouth 3 (three) times daily.    Dispense:  90 capsule    Refill:  2    Patient Instructions  -  continue ASA for stroke prevention - check BP and glucose at home - Follow up with your primary care physician for stroke risk factor modification. Recommend maintain blood pressure goal <130/80, diabetes with hemoglobin A1c goal below 6.5% and lipids with LDL cholesterol goal below 70 mg/dL.  - gabapentin 100mg  three times a day for left sided numbness tingling - self exercise at left hand - quit smoking - follow up in 3 months.    Rosalin Hawking, MD PhD [facility] [address] [place], [place] [address] [phone]

## 2015-03-12 NOTE — Patient Instructions (Addendum)
-   continue ASA for stroke prevention - check BP and glucose at home - Follow up with your primary care physician for stroke risk factor modification. Recommend maintain blood pressure goal <130/80, diabetes with hemoglobin A1c goal below 6.5% and lipids with LDL cholesterol goal below 70 mg/dL.  - gabapentin 100mg  three times a day for left sided numbness tingling - self exercise at left hand - quit smoking - follow up in 3 months.

## 2015-04-02 ENCOUNTER — Other Ambulatory Visit: Payer: Self-pay

## 2015-04-05 ENCOUNTER — Other Ambulatory Visit (INDEPENDENT_AMBULATORY_CARE_PROVIDER_SITE_OTHER): Payer: Self-pay

## 2015-04-05 DIAGNOSIS — E781 Pure hyperglyceridemia: Secondary | ICD-10-CM

## 2015-04-05 DIAGNOSIS — E118 Type 2 diabetes mellitus with unspecified complications: Secondary | ICD-10-CM

## 2015-04-05 DIAGNOSIS — E1165 Type 2 diabetes mellitus with hyperglycemia: Secondary | ICD-10-CM

## 2015-04-05 DIAGNOSIS — IMO0002 Reserved for concepts with insufficient information to code with codable children: Secondary | ICD-10-CM

## 2015-04-05 LAB — LIPID PANEL
CHOL/HDL RATIO: 5.6 ratio — AB (ref <=5.0)
Cholesterol: 146 mg/dL (ref 125–200)
HDL: 26 mg/dL — ABNORMAL LOW (ref >=40)
LDL Cholesterol: 54 mg/dL (ref <130)
Triglycerides: 330 mg/dL — ABNORMAL HIGH (ref <150)
VLDL: 66 mg/dL — AB (ref <30)

## 2015-04-05 LAB — HEMOGLOBIN A1C
HEMOGLOBIN A1C: 7.3 % — AB (ref <5.7)
MEAN PLASMA GLUCOSE: 163 mg/dL — AB (ref <117)

## 2015-04-12 ENCOUNTER — Ambulatory Visit (INDEPENDENT_AMBULATORY_CARE_PROVIDER_SITE_OTHER): Payer: Self-pay | Admitting: Family Medicine

## 2015-04-12 VITALS — BP 137/74 | HR 66 | Temp 98.1°F | Resp 16 | Ht 70.5 in | Wt 208.0 lb

## 2015-04-12 DIAGNOSIS — E1159 Type 2 diabetes mellitus with other circulatory complications: Secondary | ICD-10-CM

## 2015-04-12 DIAGNOSIS — Z720 Tobacco use: Secondary | ICD-10-CM

## 2015-04-12 DIAGNOSIS — Z8673 Personal history of transient ischemic attack (TIA), and cerebral infarction without residual deficits: Secondary | ICD-10-CM

## 2015-04-12 DIAGNOSIS — I10 Essential (primary) hypertension: Secondary | ICD-10-CM

## 2015-04-12 NOTE — Progress Notes (Signed)
Subjective:    Patient ID: Frank Schneider, male    DOB: Mar 16, 1976, 39 y.o.   MRN: [id number]  Hypertension The current episode started more than 1 month ago. The problem is unchanged. Pertinent negatives include no anxiety, blurred vision, chest pain, neck pain, orthopnea, palpitations, shortness of breath or sweats. Risk factors for coronary artery disease include diabetes mellitus, male gender and smoking/tobacco exposure. Past treatments include ACE inhibitors. The current treatment provides significant improvement.  Diabetes He presents for his follow-up diabetic visit. He has type 2 diabetes mellitus. His disease course has been stable. Pertinent negatives for hypoglycemia include no confusion, dizziness, pallor, seizures, speech difficulty, sweats or tremors. Pertinent negatives for diabetes include no blurred vision, no chest pain, no fatigue, no foot ulcerations, no polydipsia, no polyphagia, no polyuria and no weakness. Pertinent negatives for hypoglycemia complications include no blackouts, no nocturnal hypoglycemia and no required glucagon injection. Symptoms are stable. Risk factors for coronary artery disease include hypertension and male sex. Current diabetic treatment includes insulin injections. He is compliant with treatment all of the time. He is currently taking insulin at bedtime. Insulin injections are given by patient. Rotation sites for injection include the abdominal wall. He is following a diabetic diet. He has not had a previous visit with a dietitian. He rarely participates in exercise. His breakfast blood glucose is taken between 9-10 am. His breakfast blood glucose range is generally 130-140 mg/dl. An ACE inhibitor/angiotensin II receptor blocker is not being taken. He does not see a podiatrist.Eye exam is not current.   Mr. Lajuane Lender, a 39 year old male with a history of diabetes mellitus type 2, hypertension and CVA presents for a 3 month follow up of diabetes and  hypertension. Hemoglobin A1c has reduced from 9.2 to 7.3%. He states that he has been taking medications consistently. He has also been taking anti-hypertensive medications consistently.  Daily blood sugar ranges between 120-140 upon awakening. He has been taking Lantus consistently.  Past Medical History  Diagnosis Date  . Hypertension   . Priapism   . Tobacco use   . Diabetes mellitus     Takes Metformin  . Stroke ([place])   . Vision abnormalities   . Blind right eye      Medication List       This list is accurate as of: 04/12/15 11:12 AM.  Always use your most recent med list.               aspirin 325 MG tablet  Take 1 tablet (325 mg total) by mouth daily.     ferrous sulfate 325 (65 FE) MG tablet  Take 1 tablet (325 mg total) by mouth daily with breakfast.     freestyle lancets  Use as instructed     gabapentin 100 MG capsule  Commonly known as:  NEURONTIN  Take 1 capsule (100 mg total) by mouth 3 (three) times daily.     glucose blood test strip  Commonly known as:  TRUE METRIX BLOOD GLUCOSE TEST  Use as instructed     glucose blood test strip     insulin glargine 100 UNIT/ML injection  Commonly known as:  LANTUS  Inject 0.2 mLs (20 Units total) into the skin at bedtime.     INSULIN SYRINGE .5CC/30GX1/2" 30G X 1/2" 0.5 ML Misc  100     TRUEPLUS INSULIN SYRINGE 30G X 5/16" 1 ML Misc  Generic drug:  Insulin Syringe-Needle U-100     lisinopril 20 MG  tablet  Commonly known as:  PRINIVIL,ZESTRIL  Take 1 tablet (20 mg total) by mouth daily.     TRUE METRIX AIR GLUCOSE METER Devi  1 each by Does not apply route 2 (two) times daily at 10 AM and 5 PM.       Immunization History  Administered Date(s) Administered  . Influenza,inj,Quad PF,36+ Mos 01/15/2015  . Pneumococcal Polysaccharide-23 07/29/2011  . Tdap 01/15/2015  No Known Allergies Review of Systems  Constitutional: Negative for fever, fatigue and unexpected weight change.  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 Negative for blurred vision and photophobia.  Respiratory: Negative for chest tightness and shortness of breath.   Cardiovascular: Negative.  Negative for chest pain, palpitations and orthopnea.  Gastrointestinal: Negative.  Negative for nausea, abdominal pain, diarrhea and constipation.  Endocrine: Positive for cold intolerance. Negative for polydipsia, polyphagia and polyuria.  Genitourinary: Negative for urgency, frequency, decreased urine volume and testicular pain.  Musculoskeletal: Negative.  Negative for neck pain.  Skin: Negative.  Negative for pallor, rash and wound.  Allergic/Immunologic: Negative for immunocompromised state.  Neurological: Positive for numbness (left side). Negative for dizziness, tremors, seizures, facial asymmetry, speech difficulty and weakness.  Hematological: Negative.   Psychiatric/Behavioral: Negative.  Negative for suicidal ideas, confusion and sleep disturbance.       Objective:   Physical Exam  Constitutional: He appears well-developed and well-nourished.  HENT:  Head: Normocephalic.  Right Ear: Hearing, tympanic membrane, external ear and ear canal normal.  Left Ear: Hearing, tympanic membrane, external ear and ear canal normal.  Nose: Nose normal.  Mouth/Throat: Uvula is midline, oropharynx is clear and moist and mucous membranes are normal.  Eyes: Conjunctivae, EOM and lids are normal. Right pupil is reactive.  Neck: Trachea normal. No rigidity. Normal range of motion present.  Cardiovascular: Normal rate, regular rhythm, S1 normal, S2 normal, intact distal pulses and normal pulses.   Pulmonary/Chest: Effort normal and breath sounds normal.  Abdominal: Soft. Normal appearance and bowel sounds are normal.  Musculoskeletal: Normal range of motion.  Neurological: He is alert. He has normal reflexes. He is not disoriented. No cranial nerve deficit. He exhibits normal muscle tone.  Monofilament examination  within normal limits  Skin: Skin is warm, dry and intact.  Toenails thickened with mild hyperpigmentation  Psychiatric: He has a normal mood and affect. His speech is normal and behavior is normal. Judgment and thought content normal. Cognition and memory are normal.      BP 137/74 mmHg  Pulse 66  Temp(Src) 98.1 F (36.7 C) (Oral)  Resp 16  Ht 5' 10.5" (1.791 m)  Wt 208 lb (94.348 kg)  BMI 29.41 kg/m2 Assessment & Plan:  1. Type 2 diabetes mellitus with other circulatory complication (HCC) Reviewed hemoglobin A1c, which has reduced from 9.2 to 7.3% over the past several months on current medication regimen. Range between 120-140 upon awakening. Will continue Lantus at current dosage. Reviewed urinalysis, no glucosuria present.   2. Essential hypertension Blood pressure is at goal on current medication regimen. The patient is asked to make an attempt to improve diet and exercise patterns to aid in medical management of this problem.  3. Tobacco use Smoking cessation instruction/counseling given:  counseled patient on the dangers of tobacco use, advised patient to stop smoking, and reviewed strategies to maximize success  4. History of CVA (cerebrovascular accident) Continue  Aspirin therapy daily. Discussed symptoms of CVA at length. Patient is to follow up with Dr. Rosalin Hawking, neurologist  as scheduled.     RTC: 3 months for diabetes and hypertension Madalynne Gutmann M, FNP  The patient was given clear instructions to go to ER or return to medical center if symptoms do not improve, worsen or new problems develop. The patient verbalized understanding. Will notify patient with laboratory results.

## 2015-04-12 NOTE — Patient Instructions (Signed)
Diabetes and Exercise Exercising regularly is important. It is not just about losing weight. It has many health benefits, such as:  Improving your overall fitness, flexibility, and endurance.  Increasing your bone density.  Helping with weight control.  Decreasing your body fat.  Increasing your muscle strength.  Reducing stress and tension.  Improving your overall health. People with diabetes who exercise gain additional benefits because exercise:  Reduces appetite.  Improves the body's use of blood sugar (glucose).  Helps lower or control blood glucose.  Decreases blood pressure.  Helps control blood lipids (such as cholesterol and triglycerides).  Improves the body's use of the hormone insulin by:  Increasing the body's insulin sensitivity.  Reducing the body's insulin needs.  Decreases the risk for heart disease because exercising:  Lowers cholesterol and triglycerides levels.  Increases the levels of good cholesterol (such as high-density lipoproteins [HDL]) in the body.  Lowers blood glucose levels. YOUR ACTIVITY PLAN  Choose an activity that you enjoy, and set realistic goals. To exercise safely, you should begin practicing any new physical activity slowly, and gradually increase the intensity of the exercise over time. Your health care provider or diabetes educator can help create an activity plan that works for you. General recommendations include:  Encouraging children to engage in at least 60 minutes of physical activity each day.  Stretching and performing strength training exercises, such as yoga or weight lifting, at least 2 times per week.  Performing a total of at least 150 minutes of moderate-intensity exercise each week, such as brisk walking or water aerobics.  Exercising at least 3 days per week, making sure you allow no more than 2 consecutive days to pass without exercising.  Avoiding long periods of inactivity (90 minutes or more). When you  have to spend an extended period of time sitting down, take frequent breaks to walk or stretch. RECOMMENDATIONS FOR EXERCISING WITH TYPE 1 OR TYPE 2 DIABETES   Check your blood glucose before exercising. If blood glucose levels are greater than 240 mg/dL, check for urine ketones. Do not exercise if ketones are present.  Avoid injecting insulin into areas of the body that are going to be exercised. For example, avoid injecting insulin into:  The arms when playing tennis.  The legs when jogging.  Keep a record of:  Food intake before and after you exercise.  Expected peak times of insulin action.  Blood glucose levels before and after you exercise.  The type and amount of exercise you have done.  Review your records with your health care provider. Your health care provider will help you to develop guidelines for adjusting food intake and insulin amounts before and after exercising.  If you take insulin or oral hypoglycemic agents, watch for signs and symptoms of hypoglycemia. They include:  Dizziness.  Shaking.  Sweating.  Chills.  Confusion.  Drink plenty of water while you exercise to prevent dehydration or heat stroke. Body water is lost during exercise and must be replaced.  Talk to your health care provider before starting an exercise program to make sure it is safe for you. Remember, almost any type of activity is better than none.   This information is not intended to replace advice given to you by your health care provider. Make sure you discuss any questions you have with your health care provider.   Document Released: 07/04/2003 Document Revised: 08/28/2014 Document Reviewed: 09/20/2012 Elsevier Interactive Patient Education 2016 [employer] Diabetic Neuropathy Diabetic neuropathy is a nerve disease  or nerve damage that is caused by diabetes mellitus. About half of all people with diabetes mellitus have some form of nerve damage. Nerve damage is more common in  those who have had diabetes mellitus for many years and who generally have not had good control of their blood sugar (glucose) level. Diabetic neuropathy is a common complication of diabetes mellitus. There are three common types of diabetic neuropathy and a fourth type that is less common and less understood:   Peripheral neuropathy--This is the most common type of diabetic neuropathy. It causes damage to the nerves of the feet and legs first and then eventually the hands and arms.The damage affects the ability to sense touch.  Autonomic neuropathy--This type causes damage to the autonomic nervous system, which controls the following functions:  Heartbeat.  Body temperature.  Blood pressure.  Urination.  Digestion.  Sweating.  Sexual function.  Focal neuropathy--Focal neuropathy can be painful and unpredictable and occurs most often in older adults with diabetes mellitus. It involves a specific nerve or one area and often comes on suddenly. It usually does not cause long-term problems.  Radiculoplexus neuropathy-- Sometimes called lumbosacral radiculoplexus neuropathy, radiculoplexus neuropathy affects the nerves of the thighs, hips, buttocks, or legs. It is more common in people with type 2 diabetes mellitus and in older men. It is characterized by debilitating pain, weakness, and atrophy, usually in the thigh muscles. CAUSES  The cause of peripheral, autonomic, and focal neuropathies is diabetes mellitus that is uncontrolled and high glucose levels. The cause of radiculoplexus neuropathy is unknown. However, it is thought to be caused by inflammation related to uncontrolled glucose levels. SIGNS AND SYMPTOMS  Peripheral Neuropathy Peripheral neuropathy develops slowly over time. When the nerves of the feet and legs no longer work there may be:   Burning, stabbing, or aching pain in the legs or feet.  Inability to feel pressure or pain in your feet. This can lead to:  Thick  calluses over pressure areas.  Pressure sores.  Ulcers.  Foot deformities.  Reduced ability to feel temperature changes.  Muscle weakness. Autonomic Neuropathy The symptoms of autonomic neuropathy vary depending on which nerves are affected. Symptoms may include:  Problems with digestion, such as:  Feeling sick to your stomach (nausea).  Vomiting.  Bloating.  Constipation.  Diarrhea.  Abdominal pain.  Difficulty with urination. This occurs if you lose your ability to sense when your bladder is full. Problems include:  Urine leakage (incontinence).  Inability to empty your bladder completely (retention).  Rapid or irregular heartbeat (palpitations).  Blood pressure drops when you stand up (orthostatic hypotension). When you stand up you may feel:  Dizzy.  Weak.  Faint.  In men, inability to attain and maintain an erection.  In women, vaginal dryness and problems with decreased sexual desire and arousal.  Problems with body temperature regulation.  Increased or decreased sweating. Focal Neuropathy  Abnormal eye movements or abnormal alignment of both eyes.  Weakness in the wrist.  Foot drop. This results in an inability to lift the foot properly and abnormal walking or foot movement.  Paralysis on one side of your face (Bell palsy).  Chest or abdominal pain. Radiculoplexus Neuropathy  Sudden, severe pain in your hip, thigh, or buttocks.  Weakness and wasting of thigh muscles.  Difficulty rising from a seated position.  Abdominal swelling.  Unexplained weight loss (usually more than 10 lb [4.5 kg]). DIAGNOSIS  Peripheral Neuropathy Your senses may be tested. Sensory function testing can be done  with:  A light touch using a monofilament.  A vibration with tuning fork.  A sharp sensation with a pin prick. Other tests that can help diagnose neuropathy are:  Nerve conduction velocity. This test checks the transmission of an electrical  current through a nerve.  Electromyography. This shows how muscles respond to electrical signals transmitted by nearby nerves.  Quantitative sensory testing. This is used to assess how your nerves respond to vibrations and changes in temperature. Autonomic Neuropathy Diagnosis is often based on reported symptoms. Tell your health care provider if you experience:   Dizziness.   Constipation.   Diarrhea.   Inappropriate urination or inability to urinate.   Inability to get or maintain an erection.  Tests that may be done include:   Electrocardiography or Holter monitor. These are tests that can help show problems with the heart rate or heart rhythm.   An X-ray exam may be done. Focal Neuropathy Diagnosis is made based on your symptoms and what your health care provider finds during your exam. Other tests may be done. They may include:  Nerve conduction velocities. This checks the transmission of electrical current through a nerve.  Electromyography. This shows how muscles respond to electrical signals transmitted by nearby nerves.  Quantitative sensory testing. This test is used to assess how your nerves respond to vibration and changes in temperature. Radiculoplexus Neuropathy  Often the first thing is to eliminate any other issue or problems that might be the cause, as there is no stick test for diagnosis.  X-ray exam of your spine and lumbar region.  Spinal tap to rule out cancer.  MRI to rule out other lesions. TREATMENT  Once nerve damage occurs, it cannot be reversed. The goal of treatment is to keep the disease or nerve damage from getting worse and affecting more nerve fibers. Controlling your blood glucose level is the key. Most people with radiculoplexus neuropathy see at least a partial improvement over time. You will need to keep your blood glucose and HbA1c levels in the target range determined by your health care provider. Things that help control blood  glucose levels include:   Blood glucose monitoring.   Meal planning.   Physical activity.   Diabetes medicine.  Over time, maintaining lower blood glucose levels helps lessen symptoms. Sometimes, prescription pain medicine is needed. HOME CARE INSTRUCTIONS:  Do not smoke.  Keep your blood glucose level in the range that you and your health care provider have determined acceptable for you.  Keep your blood pressure level in the range that you and your health care provider have determined acceptable for you.  Eat a well-balanced diet.  Be physically active every day. Include strength training and balance exercises.  Protect your feet.  Check your feet every day for sores, cuts, blisters, or signs of infection.  Wear padded socks and supportive shoes. Use orthotic inserts, if necessary.  Regularly check the insides of your shoes for worn spots. Make sure there are no rocks or other items inside your shoes before you put them on. SEEK MEDICAL CARE IF:   You have burning, stabbing, or aching pain in the legs or feet.  You are unable to feel pressure or pain in your feet.  You develop problems with digestion such as:  Nausea.  Vomiting.  Bloating.  Constipation.  Diarrhea.  Abdominal pain.  You have difficulty with urination, such as:  Incontinence.  Retention.  You have palpitations.  You develop orthostatic hypotension. When you stand up  you may feel:  Dizzy.  Weak.  Faint.  You cannot attain and maintain an erection (in men).  You have vaginal dryness and problems with decreased sexual desire and arousal (in women).  You have severe pain in your thighs, legs, or buttocks.  You have unexplained weight loss.   This information is not intended to replace advice given to you by your health care provider. Make sure you discuss any questions you have with your health care provider.   Document Released: 06/22/2001 Document Revised: 05/04/2014  Document Reviewed: 09/22/2012 Elsevier Interactive Patient Education [employer].

## 2015-04-13 ENCOUNTER — Encounter: Payer: Self-pay | Admitting: Family Medicine

## 2015-04-28 DIAGNOSIS — I639 Cerebral infarction, unspecified: Secondary | ICD-10-CM

## 2015-04-28 HISTORY — DX: Cerebral infarction, unspecified: I63.9

## 2015-05-08 ENCOUNTER — Other Ambulatory Visit: Payer: Self-pay | Admitting: Family Medicine

## 2015-05-08 MED FILL — GABAPENTIN 100 MG CAPSULE: 100 | 30 days supply | Qty: 90 | Fill #1

## 2015-05-08 MED FILL — TRUE METRIX TEST STRIP: 12 days supply | Qty: 50 | Fill #2

## 2015-05-08 MED FILL — LANTUS 100 UNITS/ML VIAL: 100 | 50 days supply | Qty: 10 | Fill #2

## 2015-05-08 MED FILL — ?LISINOPRIL 20 MG TABLET: 20 | 30 days supply | Qty: 30 | Fill #0

## 2015-05-09 ENCOUNTER — Emergency Department (HOSPITAL_COMMUNITY): Payer: Medicaid Other

## 2015-05-09 ENCOUNTER — Encounter (HOSPITAL_COMMUNITY): Payer: Self-pay | Admitting: Nurse Practitioner

## 2015-05-09 ENCOUNTER — Inpatient Hospital Stay (HOSPITAL_COMMUNITY)
Admission: EM | Admit: 2015-05-09 | Discharge: 2015-05-12 | DRG: 062 | Disposition: A | Discharge status: Home / Self Care | Payer: Medicaid Other | Attending: Neurology | Admitting: Neurology

## 2015-05-09 DIAGNOSIS — R270 Ataxia, unspecified: Secondary | ICD-10-CM | POA: Diagnosis present

## 2015-05-09 DIAGNOSIS — I739 Peripheral vascular disease, unspecified: Secondary | ICD-10-CM | POA: Diagnosis present

## 2015-05-09 DIAGNOSIS — E860 Dehydration: Secondary | ICD-10-CM | POA: Diagnosis present

## 2015-05-09 DIAGNOSIS — F17210 Nicotine dependence, cigarettes, uncomplicated: Secondary | ICD-10-CM | POA: Diagnosis present

## 2015-05-09 DIAGNOSIS — Z7982 Long term (current) use of aspirin: Secondary | ICD-10-CM | POA: Diagnosis not present

## 2015-05-09 DIAGNOSIS — Z8673 Personal history of transient ischemic attack (TIA), and cerebral infarction without residual deficits: Secondary | ICD-10-CM

## 2015-05-09 DIAGNOSIS — E1165 Type 2 diabetes mellitus with hyperglycemia: Secondary | ICD-10-CM | POA: Diagnosis present

## 2015-05-09 DIAGNOSIS — D72829 Elevated white blood cell count, unspecified: Secondary | ICD-10-CM | POA: Insufficient documentation

## 2015-05-09 DIAGNOSIS — Z7984 Long term (current) use of oral hypoglycemic drugs: Secondary | ICD-10-CM | POA: Diagnosis not present

## 2015-05-09 DIAGNOSIS — N179 Acute kidney failure, unspecified: Secondary | ICD-10-CM | POA: Diagnosis present

## 2015-05-09 DIAGNOSIS — IMO0002 Reserved for concepts with insufficient information to code with codable children: Secondary | ICD-10-CM

## 2015-05-09 DIAGNOSIS — I6350 Cerebral infarction due to unspecified occlusion or stenosis of unspecified cerebral artery: Secondary | ICD-10-CM

## 2015-05-09 DIAGNOSIS — E118 Type 2 diabetes mellitus with unspecified complications: Secondary | ICD-10-CM

## 2015-05-09 DIAGNOSIS — I639 Cerebral infarction, unspecified: Secondary | ICD-10-CM | POA: Diagnosis present

## 2015-05-09 DIAGNOSIS — H5441 Blindness, right eye, normal vision left eye: Secondary | ICD-10-CM | POA: Diagnosis present

## 2015-05-09 DIAGNOSIS — I10 Essential (primary) hypertension: Secondary | ICD-10-CM | POA: Diagnosis present

## 2015-05-09 HISTORY — DX: Personal history of transient ischemic attack (TIA), and cerebral infarction without residual deficits: Z86.73

## 2015-05-09 LAB — I-STAT CHEM 8, ED
BUN: 36 mg/dL — ABNORMAL HIGH (ref 6–20)
CALCIUM ION: 1.09 mmol/L — AB (ref 1.12–1.23)
CREATININE: 2 mg/dL — AB (ref 0.61–1.24)
Chloride: 94 mmol/L — ABNORMAL LOW (ref 101–111)
GLUCOSE: 308 mg/dL — AB (ref 65–99)
HCT: 49 % (ref 39.0–52.0)
HEMOGLOBIN: 16.7 g/dL (ref 13.0–17.0)
POTASSIUM: 4.5 mmol/L (ref 3.5–5.1)
Sodium: 133 mmol/L — ABNORMAL LOW (ref 135–145)
TCO2: 25 mmol/L (ref 0–100)

## 2015-05-09 LAB — COMPREHENSIVE METABOLIC PANEL
ALBUMIN: 4.5 g/dL (ref 3.5–5.0)
ALT: 15 U/L — AB (ref 17–63)
AST: 22 U/L (ref 15–41)
Alkaline Phosphatase: 60 U/L (ref 38–126)
Anion gap: 14 (ref 5–15)
BILIRUBIN TOTAL: 0.5 mg/dL (ref 0.3–1.2)
BUN: 33 mg/dL — AB (ref 6–20)
CALCIUM: 9.1 mg/dL (ref 8.9–10.3)
CO2: 23 mmol/L (ref 22–32)
CREATININE: 2.21 mg/dL — AB (ref 0.61–1.24)
Chloride: 95 mmol/L — ABNORMAL LOW (ref 101–111)
GFR calc Af Amer: 41 mL/min — ABNORMAL LOW (ref >60)
GFR, EST NON AFRICAN AMERICAN: 36 mL/min — AB (ref >60)
GLUCOSE: 295 mg/dL — AB (ref 65–99)
Potassium: 4.7 mmol/L (ref 3.5–5.1)
Sodium: 132 mmol/L — ABNORMAL LOW (ref 135–145)
TOTAL PROTEIN: 6.7 g/dL (ref 6.5–8.1)

## 2015-05-09 LAB — CBC
HEMATOCRIT: 40.6 % (ref 39.0–52.0)
HEMOGLOBIN: 13.4 g/dL (ref 13.0–17.0)
MCH: 19.9 pg — ABNORMAL LOW (ref 26.0–34.0)
MCHC: 33 g/dL (ref 30.0–36.0)
MCV: 60.4 fL — AB (ref 78.0–100.0)
Platelets: 167 10*3/uL (ref 150–400)
RBC: 6.72 MIL/uL — ABNORMAL HIGH (ref 4.22–5.81)
RDW: 14.7 % (ref 11.5–15.5)
WBC: 13.7 10*3/uL — AB (ref 4.0–10.5)

## 2015-05-09 LAB — PROTIME-INR
INR: 1.06 (ref 0.00–1.49)
Prothrombin Time: 14 seconds (ref 11.6–15.2)

## 2015-05-09 LAB — DIFFERENTIAL
BASOS ABS: 0 10*3/uL (ref 0.0–0.1)
BASOS PCT: 0 %
EOS PCT: 1 %
Eosinophils Absolute: 0.1 10*3/uL (ref 0.0–0.7)
Lymphocytes Relative: 21 %
Lymphs Abs: 2.9 10*3/uL (ref 0.7–4.0)
MONO ABS: 0.8 10*3/uL (ref 0.1–1.0)
MONOS PCT: 6 %
Neutro Abs: 9.9 10*3/uL — ABNORMAL HIGH (ref 1.7–7.7)
Neutrophils Relative %: 72 %

## 2015-05-09 LAB — I-STAT TROPONIN, ED: TROPONIN I, POC: 0 ng/mL (ref 0.00–0.08)

## 2015-05-09 LAB — CBG MONITORING, ED: Glucose-Capillary: 309 mg/dL — ABNORMAL HIGH (ref 65–99)

## 2015-05-09 LAB — MRSA PCR SCREENING: MRSA by PCR: NEGATIVE

## 2015-05-09 LAB — APTT: APTT: 25 s (ref 24–37)

## 2015-05-09 LAB — ETHANOL

## 2015-05-09 LAB — GLUCOSE, CAPILLARY: Glucose-Capillary: 269 mg/dL — ABNORMAL HIGH (ref 65–99)

## 2015-05-09 MED ORDER — STROKE: EARLY STAGES OF RECOVERY BOOK
Freq: Once | Status: AC
  Administered 2015-05-09: 21:00:00
  Filled 2015-05-09: qty 1

## 2015-05-09 MED ORDER — SODIUM CHLORIDE 0.9 % IV SOLN
INTRAVENOUS | Status: DC
  Administered 2015-05-09 – 2015-05-11 (×2): via INTRAVENOUS

## 2015-05-09 MED ORDER — SODIUM CHLORIDE 0.9 % IV BOLUS (SEPSIS)
500.0000 mL | Freq: Once | INTRAVENOUS | Status: AC
  Administered 2015-05-09: 500 mL via INTRAVENOUS

## 2015-05-09 MED ORDER — INSULIN ASPART 100 UNIT/ML ~~LOC~~ SOLN
0.0000 [IU] | Freq: Three times a day (TID) | SUBCUTANEOUS | Status: DC
  Administered 2015-05-10: 3 [IU] via SUBCUTANEOUS
  Administered 2015-05-10: 5 [IU] via SUBCUTANEOUS
  Administered 2015-05-10: 3 [IU] via SUBCUTANEOUS

## 2015-05-09 MED ORDER — ACETAMINOPHEN 650 MG RE SUPP: 650.0000 mg | RECTAL | Status: DC | PRN

## 2015-05-09 MED ORDER — LABETALOL HCL 5 MG/ML IV SOLN: 10.0000 mg | INTRAVENOUS | Status: DC | PRN

## 2015-05-09 MED ORDER — ALTEPLASE (STROKE) FULL DOSE INFUSION
0.9000 mg/kg | Freq: Once | INTRAVENOUS | Status: AC
  Administered 2015-05-09: 85 mg via INTRAVENOUS
  Filled 2015-05-09: qty 85

## 2015-05-09 MED ORDER — ACETAMINOPHEN 325 MG PO TABS
650.0000 mg | ORAL_TABLET | ORAL | Status: DC | PRN
  Administered 2015-05-11 (×2): 650 mg via ORAL
  Filled 2015-05-09 (×2): qty 2

## 2015-05-09 MED ORDER — PANTOPRAZOLE SODIUM 40 MG IV SOLR
40.0000 mg | Freq: Every day | INTRAVENOUS | Status: DC
  Administered 2015-05-09 – 2015-05-11 (×3): 40 mg via INTRAVENOUS
  Filled 2015-05-09 (×4): qty 40

## 2015-05-09 MED ORDER — SODIUM CHLORIDE 0.9 % IV SOLN
Freq: Once | INTRAVENOUS | Status: AC
  Administered 2015-05-09: 18:00:00 via INTRAVENOUS

## 2015-05-09 NOTE — Consult Note (Deleted)
Neurology Consultation Reason for Consult: Left-sided weakness Referring Physician: Rancour, S  CC: Left-sided weakness  History is obtained from: Patient, mother  HPI: Frank Schneider is a 40 y.o. male with a history of previous stroke in September as well as diabetes, hypertension who presents with left-sided weakness and discoordination that started abruptly at 2 PM today. He was playing [employer] and noticed it came on suddenly. He has numbness of the left side as well, but states that he always has some tingling of the left side of her since a stroke back in September.  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 His A1c of the time was 9.2. When Dr. Erlinda Hong him in November, he had full strength with the exception of left hand dexterity. He did not have any dysmetria. It was felt that his previous infarct was likely due to small vessel disease  He has been feeling generally ill for the past couple of days with mild non-specific dizziness(no vertigo, dysequilibrium). He appears slightly diaphoretic.   LKW: 2 PM tpa given?: yes  ROS: A 14 point ROS was performed and is negative except as noted in the HPI.   Past Medical History  Diagnosis Date  . Hypertension   . Priapism   . Tobacco use   . Diabetes mellitus     Takes Metformin  . Stroke ([place])   . Vision abnormalities   . Blind right eye      Family History  Problem Relation Age of Onset  . Diabetes Mother   . Hypertension Mother   . Diabetes Maternal Grandmother   . Hypertension Maternal Grandmother   . Depression Maternal Grandmother      Social History:  reports that he has been smoking Cigarettes.  He has a 4 pack-year smoking history. He has never used smokeless tobacco. He reports that he does not drink alcohol or use illicit drugs.   Exam: Current vital signs: BP 109/53 mmHg  Pulse 81  Temp(Src) 98.3 F (36.8 C) (Oral)  Resp 25   Ht 5\' 10"  (1.778 m)  Wt 94 kg (207 lb 3.7 oz)  BMI 29.73 kg/m2  SpO2 96% Vital signs in last 24 hours: Temp:  [98.3 F (36.8 C)-98.6 F (37 C)] 98.3 F (36.8 C) (01/12 1745) Pulse Rate:  [73-90] 81 (01/12 1750) Resp:  [15-25] 25 (01/12 1750) BP: (107-127)/(53-80) 109/53 mmHg (01/12 1750) SpO2:  [95 %-99 %] 96 % (01/12 1750) Weight:  [94 kg (207 lb 3.7 oz)] 94 kg (207 lb 3.7 oz) (01/12 1706)  Physical Exam  Constitutional: Appears diaphoretic Psych: Affect appropriate to situation Eyes: No scleral injection HENT: No OP obstrucion Head: Normocephalic.  Cardiovascular: Normal rate and regular rhythm.  Respiratory: Effort normal and breath sounds normal to anterior ascultation GI: Soft.  No distension. There is no tenderness.  Skin: WDI  Neuro: Mental Status: Patient is awake, alert, oriented to person, place, month, year, and situation. Patient is able to give a clear and coherent history. No signs of aphasia or neglect Cranial Nerves: II: Visual Fields are full. Pupils are equal, round, and reactive to light.   III,IV, VI: EOMI without ptosis or diploplia.  V: Facial sensation is diminished on the left VII: Facial movement is symmetric.  VIII: hearing is intact to voice X: Uvula elevates symmetrically XI: Shoulder shrug is symmetric. XII: tongue is midline without atrophy or fasciculations.  Motor: Tone  is normal. Bulk is normal. 5/5 strength was present on the right side, he has 4-/5 weakness of his left arm and 3/5 weakness of his left leg Sensory: Sensation is diminished throughout the left side Cerebellar: FNF and HKS are intact on the right, he has marked ataxia out of proportion to his weakness in the left arm and leg.   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 He presented within the time window for IV TPA and therefore is receiving it. EKG was mildly abnormal, but unchanged from previous ones. He will be admitted to the ICU.   He has AKI as evidence by bump in Cr from 0.95 to 2 and I wonder if he has been dehydrated due to feeling poorly.   Recommendations: 1. HgbA1c, fasting lipid panel 2. MRI, MRA  of the brain without contrast 3. Frequent neuro checks 4. Echocardiogram 5. Carotid dopplers 6. Prophylactic therapy-Antiplatelet med: none for 24 hours.  7. Risk factor modification 8. Telemetry monitoring 9. PT consult, OT consult, Speech consult 10. Rehydrate, recheck labs in teh am for AKI 11. SSI for DM   This patient is critically ill and at significant risk of neurological worsening, death and care requires constant monitoring of vital signs, hemodynamics,respiratory and cardiac monitoring, neurological assessment, discussion with family, other specialists and medical decision making of high complexity. I spent 45 minutes of neurocritical care time  in the care of  this patient.  Roland Rack, MD [facility] [phone]  If 7pm- 7am, please page neurology on call as listed in [place]. 05/09/2015  6:39 PM

## 2015-05-09 NOTE — ED Notes (Signed)
Pt c/o approx 3 day history of feeling dizzy and "not well." today at 1400 he noticed sudden onset of L sided weakness in his body. He has hx stroke and has some gait difficulty remaining from his prior stroke. On exam, L grips weaker than R and L arm drift. He is diaphoretic also. He denies pain. His speech is clear but sluggish. Code stroke called

## 2015-05-09 NOTE — ED Provider Notes (Signed)
CSN: [id number]     Arrival date & time 05/09/15  1640 History   First MD Initiated Contact with Patient 05/09/15 1707     Chief Complaint  Patient presents with  . Code Stroke     (Consider location/radiation/quality/duration/timing/severity/associated sxs/prior Treatment) HPI Comments: Patient present with left-sided weakness since 2:00. History of diabetes, previous stroke in September 2016. States he feels dizzy and lightheadedness diaphoretic. Denies chest pain. Denies back pain. Denies abdominal pain. Last seen normal was 2 PM. He denies any difficulty swallowing but does have some difficulty speaking.  The history is provided by the patient and a caregiver. The history is limited by the condition of the patient.    Past Medical History  Diagnosis Date  . Hypertension   . Priapism   . Tobacco use   . Diabetes mellitus     Takes Metformin  . Stroke ([place])   . Vision abnormalities   . Blind right eye    Past Surgical History  Procedure Laterality Date  . Penectomy    . Incision and drainage perirectal abscess  07/28/2011    Procedure: IRRIGATION AND DEBRIDEMENT PERIRECTAL ABSCESS;  Surgeon: Adin Hector, MD;  Location: [facility];  Service: General;  Laterality: N/A;   of skin muscle and subcutaneous tissue of perimeum  8cmx12cm area    Family History  Problem Relation Age of Onset  . Diabetes Mother   . Hypertension Mother   . Diabetes Maternal Grandmother   . Hypertension Maternal Grandmother   . Depression Maternal Grandmother    Social History  Substance Use Topics  . Smoking status: Current Some Day Smoker -- 0.50 packs/day for 8 years    Types: Cigarettes  . Smokeless tobacco: Never Used  . Alcohol Use: No    Review of Systems  Unable to perform ROS: Acuity of condition      Allergies  Review of patient's allergies indicates no known allergies.  Home Medications   Prior to Admission medications   Medication Sig Start Date End Date Taking?  Authorizing Provider  aspirin 325 MG tablet Take 1 tablet (325 mg total) by mouth daily. 02/14/15  Yes Dorena Dew, FNP  Blood Glucose Monitoring Suppl (TRUE METRIX AIR GLUCOSE METER) DEVI 1 each by Does not apply route 2 (two) times daily at 10 AM and 5 PM. 01/15/15  Yes Dorena Dew, FNP  ferrous sulfate 325 (65 FE) MG tablet Take 1 tablet (325 mg total) by mouth daily with breakfast. 01/16/15  Yes Dorena Dew, FNP  gabapentin (NEURONTIN) 100 MG capsule Take 1 capsule (100 mg total) by mouth 3 (three) times daily. 03/12/15  Yes Rosalin Hawking, MD  glucose blood test strip  02/18/15  Yes Historical Provider, MD  insulin glargine (LANTUS) 100 UNIT/ML injection Inject 0.2 mLs (20 Units total) into the skin at bedtime. Patient taking differently: Inject 20 Units into the skin daily.  01/15/15  Yes Dorena Dew, FNP  Insulin Syringe-Needle U-100 (INSULIN SYRINGE .5CC/30GX1/2") 30G X 1/2" 0.5 ML MISC 100 01/03/15  Yes Reyne Dumas, MD  Lancets (FREESTYLE) lancets Use as instructed 01/03/15  Yes Reyne Dumas, MD  lisinopril (PRINIVIL,ZESTRIL) 20 MG tablet TAKE 1 TABLET BY MOUTH DAILY 05/08/15  Yes Dorena Dew, FNP  TRUEPLUS INSULIN SYRINGE 30G X 5/16" 1 ML MISC  01/03/15  Yes Historical Provider, MD  glucose blood (TRUE METRIX BLOOD GLUCOSE TEST) test strip Use as instructed 01/15/15   Dorena Dew, FNP   BP 102/53  mmHg  Pulse 63  Temp(Src) 97.6 F (36.4 C) (Oral)  Resp 14  Ht 5\' 10"  (1.778 m)  Wt 207 lb 3.7 oz (94 kg)  BMI 29.73 kg/m2  SpO2 100% Physical Exam  Constitutional: He appears well-developed and well-nourished. No distress.  HENT:  Head: Normocephalic and atraumatic.  Mouth/Throat: Oropharynx is clear and moist. No oropharyngeal exudate.  Eyes: Conjunctivae and EOM are normal. Pupils are equal, round, and reactive to light.  Neck: Normal range of motion. Neck supple.  Cardiovascular: Normal rate, regular rhythm and normal heart sounds.   No murmur  heard. Pulmonary/Chest: Effort normal and breath sounds normal. No respiratory distress.  Abdominal: Soft. There is no tenderness. There is no rebound.  Musculoskeletal: Normal range of motion. He exhibits no edema or tenderness.  Neurological: He is alert. No cranial nerve deficit. He exhibits normal muscle tone. Coordination normal.  L arm grip strength weakness. L leg weakness.  CN 2-12 intact. Pronator drift on L. 5/5 on RUE and RLE.  Dysarthria, no droop, tongue midline.  Skin: Skin is warm. He is diaphoretic.    ED Course  Procedures (including critical care time) Labs Review Labs Reviewed  CBC - Abnormal; Notable for the following:    WBC 13.7 (*)    RBC 6.72 (*)    MCV 60.4 (*)    MCH 19.9 (*)    All other components within normal limits  DIFFERENTIAL - Abnormal; Notable for the following:    Neutro Abs 9.9 (*)    All other components within normal limits  COMPREHENSIVE METABOLIC PANEL - Abnormal; Notable for the following:    Sodium 132 (*)    Chloride 95 (*)    Glucose, Bld 295 (*)    BUN 33 (*)    Creatinine, Ser 2.21 (*)    ALT 15 (*)    GFR calc non Af Amer 36 (*)    GFR calc Af Amer 41 (*)    All other components within normal limits  GLUCOSE, CAPILLARY - Abnormal; Notable for the following:    Glucose-Capillary 269 (*)    All other components within normal limits  I-STAT CHEM 8, ED - Abnormal; Notable for the following:    Sodium 133 (*)    Chloride 94 (*)    BUN 36 (*)    Creatinine, Ser 2.00 (*)    Glucose, Bld 308 (*)    Calcium, Ion 1.09 (*)    All other components within normal limits  CBG MONITORING, ED - Abnormal; Notable for the following:    Glucose-Capillary 309 (*)    All other components within normal limits  MRSA PCR SCREENING  ETHANOL  PROTIME-INR  APTT  URINE RAPID DRUG SCREEN, HOSP PERFORMED  URINALYSIS, ROUTINE W REFLEX MICROSCOPIC (NOT AT [facility])  HEMOGLOBIN A1C  LIPID PANEL  I-STAT TROPOININ, ED    Imaging Review Ct Head Wo  Contrast  05/09/2015  CLINICAL DATA:  Code stroke.  Left-sided weakness. EXAM: CT HEAD WITHOUT CONTRAST TECHNIQUE: Contiguous axial images were obtained from the base of the skull through the vertex without intravenous contrast. COMPARISON:  01/02/2015 FINDINGS: There is no evidence of mass effect, midline shift or extra-axial fluid collections. There is no evidence of a space-occupying lesion or intracranial hemorrhage. There is no evidence of a cortical-based area of acute infarction. The ventricles and sulci are appropriate for the patient's age. The basal cisterns are patent. Visualized portions of the orbits are unremarkable. The visualized portions of the paranasal sinuses and  mastoid air cells are unremarkable. The osseous structures are unremarkable. IMPRESSION: Normal CT of the brain without intravenous contrast. These results were called by telephone at the time of interpretation on 05/09/2015 at 5:21 pm to Dr. Leonel Ramsay, who verbally acknowledged these results. Electronically Signed   By: Kathreen Devoid   On: 05/09/2015 17:22   Dg Chest Portable 1 View  05/09/2015  CLINICAL DATA:  Dizziness for 3 days. Acute onset left-sided weakness. History of prior stroke. Initial encounter. EXAM: PORTABLE CHEST 1 VIEW COMPARISON:  PA and lateral chest 11/09/2013. FINDINGS: Heart size and mediastinal contours are within normal limits. Both lungs are clear. Visualized skeletal structures are unremarkable. IMPRESSION: Normal exam. Electronically Signed   By: Inge Rise M.D.   On: 05/09/2015 17:43   I have personally reviewed and evaluated these images and lab results as part of my medical decision-making.   EKG Interpretation   Date/Time:  Thursday May 09 2015 17:06:03 EST Ventricular Rate:  90 PR Interval:  146 QRS Duration: 86 QT Interval:  344 QTC Calculation: 420 R Axis:   30 Text Interpretation:  Normal sinus rhythm Minimal voltage criteria for  LVH, may be normal variant ST elevation,  consider early repolarization,  pericarditis, or injury Abnormal ECG No significant change was found  Confirmed by Wyvonnia Dusky  MD, Izreal Kock [phone]) on 05/09/2015 5:56:53 PM      MDM   Final diagnoses:  Cerebrovascular accident (CVA), unspecified mechanism ([place])  code stroke on arrival. Patient seen with Dr. Leonel Ramsay. He is diaphoretic. No chest pain or shortness of breath. EKG shows ST depression and T-wave inversion in lead 3 with hyperacute T waves and similar to previous.  Blood sugar 300. CT head negative for hemorrhage. Cr elevated at 2.  Dr. Leonel Ramsay feels patient is a TPA candidate given his deficits and time of onset of symptoms. His ischemic stroke in September was very small and is greater than 90 days previous. Patient and family are agreeable to TPA. His EKG is not consistent with STEMI. Discussed with Dr. Ellyn Hack.  Low suspicion for pulmonary embolism or aorta dissection. No chest pain or shortness of breath.  TPA will be started by Dr. Leonel Ramsay. Patient will be admitted to neuro ICU. Maintaining airway. Vitals have remained stable in the ED.  CRITICAL CARE Performed by: Ezequiel Essex Total critical care time: 40 minutes Critical care time was exclusive of separately billable procedures and treating other patients. Critical care was necessary to treat or prevent imminent or life-threatening deterioration. Critical care was time spent personally by me on the following activities: development of treatment plan with patient and/or surrogate as well as nursing, discussions with consultants, evaluation of patient's response to treatment, examination of patient, obtaining history from patient or surrogate, ordering and performing treatments and interventions, ordering and review of laboratory studies, ordering and review of radiographic studies, pulse oximetry and re-evaluation of patient's condition.     Ezequiel Essex, MD 05/10/15 [phone]

## 2015-05-10 ENCOUNTER — Inpatient Hospital Stay (HOSPITAL_COMMUNITY): Payer: Self-pay

## 2015-05-10 ENCOUNTER — Encounter (HOSPITAL_COMMUNITY): Payer: Self-pay | Admitting: *Deleted

## 2015-05-10 ENCOUNTER — Inpatient Hospital Stay (HOSPITAL_COMMUNITY): Payer: Medicaid Other

## 2015-05-10 LAB — RAPID URINE DRUG SCREEN, HOSP PERFORMED
Amphetamines: NOT DETECTED
BARBITURATES: NOT DETECTED
Benzodiazepines: NOT DETECTED
Cocaine: NOT DETECTED
Opiates: NOT DETECTED
Tetrahydrocannabinol: POSITIVE — AB

## 2015-05-10 LAB — URINALYSIS, ROUTINE W REFLEX MICROSCOPIC
Bilirubin Urine: NEGATIVE
GLUCOSE, UA: 500 mg/dL — AB
HGB URINE DIPSTICK: NEGATIVE
KETONES UR: NEGATIVE mg/dL
LEUKOCYTES UA: NEGATIVE
NITRITE: NEGATIVE
PROTEIN: NEGATIVE mg/dL
SPECIFIC GRAVITY, URINE: 1.016 (ref 1.005–1.030)
pH: 6 (ref 5.0–8.0)

## 2015-05-10 LAB — LIPID PANEL
CHOLESTEROL: 115 mg/dL (ref 0–200)
HDL: 18 mg/dL — ABNORMAL LOW (ref >40)
LDL Cholesterol: 55 mg/dL (ref 0–99)
TRIGLYCERIDES: 211 mg/dL — AB (ref <150)
Total CHOL/HDL Ratio: 6.4 RATIO
VLDL: 42 mg/dL — ABNORMAL HIGH (ref 0–40)

## 2015-05-10 LAB — GLUCOSE, CAPILLARY
GLUCOSE-CAPILLARY: 214 mg/dL — AB (ref 65–99)
GLUCOSE-CAPILLARY: 193 mg/dL — AB (ref 65–99)
GLUCOSE-CAPILLARY: 288 mg/dL — AB (ref 65–99)
Glucose-Capillary: 164 mg/dL — ABNORMAL HIGH (ref 65–99)

## 2015-05-10 MED ORDER — INSULIN ASPART 100 UNIT/ML ~~LOC~~ SOLN
0.0000 [IU] | Freq: Three times a day (TID) | SUBCUTANEOUS | Status: DC
  Administered 2015-05-11: 8 [IU] via SUBCUTANEOUS
  Administered 2015-05-11: 2 [IU] via SUBCUTANEOUS
  Administered 2015-05-11: 5 [IU] via SUBCUTANEOUS
  Administered 2015-05-12 (×2): 3 [IU] via SUBCUTANEOUS

## 2015-05-10 MED ORDER — INSULIN ASPART 100 UNIT/ML ~~LOC~~ SOLN
0.0000 [IU] | Freq: Every day | SUBCUTANEOUS | Status: DC
  Administered 2015-05-10: 3 [IU] via SUBCUTANEOUS

## 2015-05-10 NOTE — Progress Notes (Signed)
   05/10/15 1400  Clinical Encounter Type  Visited With Patient  Visit Type Initial (Request Adv directive materials)  Referral From Patient;Nurse  Spiritual Encounters  Spiritual Needs Literature;Emotional  Ch responded to consult for Adv. Directive; Materials provided and explanation of forms; [place] indicated that documents may be able to be notarized today if completed by 3:30PM; if not pt will have to arrange for notary outside of hospital or wait until Monday.  Contact [facility] if paperwork ready, [phone]. Gwynn Burly 2:12 PM

## 2015-05-10 NOTE — Progress Notes (Signed)
PT Cancellation Note  Patient Details Name: Frank Schneider MRN: [id number] DOB: [birth year]   Cancelled Treatment:    Reason Eval/Treat Not Completed: Medical issues which prohibited therapy Pt on strict bedrest s/p tPA. Will await increase in activity orders prior to initiation of PT evaluation.   Marguarite Arbour A [name] 05/10/2015, 9:48 AM Wray Kearns, PT, DPT [phone]

## 2015-05-10 NOTE — H&P (Signed)
Neurology H&P Reason for Consult: Left-sided weakness Referring Physician: Rancour, S  CC: Left-sided weakness  History is obtained from: Patient, mother  HPI: Frank Schneider is a 40 y.o. male with a history of previous stroke in September as well as diabetes, hypertension who presents with left-sided weakness and discoordination that started abruptly at 2 PM today. He was playing [employer] and noticed it came on suddenly. He has numbness of the left side as well, but states that he always has some tingling of the left side of her since a stroke back in September.  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 His A1c of the time was 9.2. When Dr. Erlinda Hong him in November, he had full strength with the exception of left hand dexterity. He did not have any dysmetria. It was felt that his previous infarct was likely due to small vessel disease  He has been feeling generally ill for the past couple of days with mild non-specific dizziness(no vertigo, dysequilibrium). He appears slightly diaphoretic.   LKW: 2 PM tpa given?: yes  ROS: A 14 point ROS was performed and is negative except as noted in the HPI.   Past Medical History  Diagnosis Date  . Hypertension   . Priapism   . Tobacco use   . Diabetes mellitus     Takes Metformin  . Stroke ([place])   . Vision abnormalities   . Blind right eye      Family History  Problem Relation Age of Onset  . Diabetes Mother   . Hypertension Mother   . Diabetes Maternal Grandmother   . Hypertension Maternal Grandmother   . Depression Maternal Grandmother      Social History:  reports that he has been smoking Cigarettes.  He has a 4 pack-year smoking history. He has never used smokeless tobacco. He reports that he does not drink alcohol or use illicit drugs.   Exam: Current vital signs: BP 124/73 mmHg  Pulse 69  Temp(Src) 97.6 F (36.4 C) (Oral)  Resp 17  Ht 5'  10" (1.778 m)  Wt 91.2 kg (201 lb 1 oz)  BMI 28.85 kg/m2  SpO2 100% Vital signs in last 24 hours: Temp:  [97.6 F (36.4 C)-98.6 F (37 C)] 97.6 F (36.4 C) (01/13 0406) Pulse Rate:  [57-90] 69 (01/13 0700) Resp:  [13-28] 17 (01/13 0700) BP: (81-127)/(45-80) 124/73 mmHg (01/13 0700) SpO2:  [95 %-100 %] 100 % (01/13 0700) Weight:  [91.2 kg (201 lb 1 oz)-94 kg (207 lb 3.7 oz)] 91.2 kg (201 lb 1 oz) (01/12 2005)  Physical Exam  Constitutional: Appears diaphoretic Psych: Affect appropriate to situation Eyes: No scleral injection HENT: No OP obstrucion Head: Normocephalic.  Cardiovascular: Normal rate and regular rhythm.  Respiratory: Effort normal and breath sounds normal to anterior ascultation GI: Soft.  No distension. There is no tenderness.  Skin: WDI  Neuro: Mental Status: Patient is awake, alert, oriented to person, place, month, year, and situation. Patient is able to give a clear and coherent history. No signs of aphasia or neglect Cranial Nerves: II: Visual Fields are full. Pupils are equal, round, and reactive to light.   III,IV, VI: EOMI without ptosis or diploplia.  V: Facial sensation is diminished on the left VII: Facial movement is symmetric.  VIII: hearing is intact to voice X: Uvula elevates symmetrically XI: Shoulder shrug is symmetric. XII: tongue is midline without atrophy  or fasciculations.  Motor: Tone is normal. Bulk is normal. 5/5 strength was present on the right side, he has 4-/5 weakness of his left arm and 3/5 weakness of his left leg Sensory: Sensation is diminished throughout the left side Cerebellar: FNF and HKS are intact on the right, he has marked ataxia out of proportion to his weakness in the left arm and leg.   I have reviewed labs in epic and the results pertinent to this consultation are: Mildly elevated creatinine at 1.5   I have reviewed the images obtained: CT head-no acute finding  Impression: 40 year old male with recurrent  ischemic infarct. He presented within the time window for IV TPA and therefore is receiving it. EKG was mildly abnormal, but unchanged from previous ones. He will be admitted to the ICU.   He has AKI as evidence by bump in Cr from 0.95 to 2 and I wonder if he has been dehydrated due to feeling poorly.   Recommendations: 1. HgbA1c, fasting lipid panel 2. MRI, MRA  of the brain without contrast 3. Frequent neuro checks 4. Echocardiogram 5. Carotid dopplers 6. Prophylactic therapy-Antiplatelet med: none for 24 hours.  7. Risk factor modification 8. Telemetry monitoring 9. PT consult, OT consult, Speech consult 10. Rehydrate, recheck labs in teh am for AKI 11. SSI for DM   This patient is critically ill and at significant risk of neurological worsening, death and care requires constant monitoring of vital signs, hemodynamics,respiratory and cardiac monitoring, neurological assessment, discussion with family, other specialists and medical decision making of high complexity. I spent 45 minutes of neurocritical care time  in the care of  this patient.  Roland Rack, MD [facility] [phone]  If 7pm- 7am, please page neurology on call as listed in [place].

## 2015-05-10 NOTE — Evaluation (Signed)
Physical Therapy Evaluation Patient Details Name: Frank Schneider MRN: [id number] DOB: 21-Jun-1975 Today's Date: 05/10/2015   History of Present Illness  Patient is a 40 y/o male with hx of HTN, DM, blindness right eye and CVA (9/16) presents with left-sided weakness and discoordination. Head CT- unremarkable. Found to have presumed Non-dominant right brain subcortical infarct most likely secondary to small vessel disease source. s/p tPA.  Clinical Impression  Patient presents with mild left sided weakness, balance deficits and discoordination s/p CVA impacting mobility. Pt requires Min A for balance during gait training today due to left knee instability. Will try RW next session to assess safety and balance. Pt lives at home with mother. Education re: signs/symptoms of CVA. Encouraged ambulation later today with RN to improve strength/mobility. Pending progress in tomorrow's session, pt may need RW and HHPT? Will follow acutely to maximize independence and mobility.     Follow Up Recommendations  (pending progress by tomorrow)    Equipment Recommendations  Other (comment) (TBD pending trial with RW tomorrow.)    Recommendations for Other Services OT consult     Precautions / Restrictions Precautions Precaution Comments: blindness right eye Restrictions Weight Bearing Restrictions: No      Mobility  Bed Mobility Overal bed mobility: Needs Assistance Bed Mobility: Supine to Sit     Supine to sit: Modified independent (Device/Increase time);HOB elevated     General bed mobility comments: Increased time. Use of rail for support.   Transfers Overall transfer level: Needs assistance Equipment used: None Transfers: Sit to/from Stand Sit to Stand: Min guard         General transfer comment: Min guard for safety. Unsteady in standing reaching for IV pole for support. Transferred to chair post ambulation bout.  Ambulation/Gait Ambulation/Gait assistance: Min assist Ambulation  Distance (Feet): 125 Feet Assistive device: None Gait Pattern/deviations: Step-through pattern;Decreased stride length Gait velocity: decreased Gait velocity interpretation: Below normal speed for age/gender General Gait Details: Instability noted in LLE during stance phase but no actual knee buckling noted. Staggering noted to right/left requiring min A for balance. Would benefit from RW for support.  Stairs            Wheelchair Mobility    Modified Rankin (Stroke Patients Only) Modified Rankin (Stroke Patients Only) Pre-Morbid Rankin Score: Slight disability Modified Rankin: Moderately severe disability     Balance Overall balance assessment: Needs assistance Sitting-balance support: Feet supported;No upper extremity supported Sitting balance-Leahy Scale: Fair Sitting balance - Comments: Able to reach outside BoS and donn socks with some difficulty.   Standing balance support: During functional activity Standing balance-Leahy Scale: Fair Standing balance comment: Able to stand unsupported but requires Min A for dynamic standing/ambulation.                             Pertinent Vitals/Pain Pain Assessment: No/denies pain    Home Living Family/patient expects to be discharged to:: Private residence Living Arrangements: Parent Available Help at Discharge: Family Type of Home: Apartment Home Access: Stairs to enter Entrance Stairs-Rails: Right Entrance Stairs-Number of Steps: 3 Home Layout: One level Home Equipment: Cane - single point      Prior Function Level of Independence: Independent               Hand Dominance   Dominant Hand: Right    Extremity/Trunk Assessment   Upper Extremity Assessment: Defer to OT evaluation (dysmetria noted in LUE during finger to nose testing. Reports  tingling from prior CVA.)           Lower Extremity Assessment: LLE deficits/detail   LLE Deficits / Details: Functional with MMT ~4/5 throughout  however instability in quads noted during ambulation.     Communication   Communication: No difficulties  Cognition Arousal/Alertness: Awake/alert Behavior During Therapy: WFL for tasks assessed/performed Overall Cognitive Status: Within Functional Limits for tasks assessed                      General Comments General comments (skin integrity, edema, etc.): VSS throughout. Difficult time getting BP to register. Pt reports blindness in right eye and impaired vision in left eye- wear glasses. Not able to see details.    Exercises        Assessment/Plan    PT Assessment Patient needs continued PT services  PT Diagnosis Difficulty walking   PT Problem List Decreased strength;Impaired sensation;Decreased balance;Decreased mobility;Decreased activity tolerance;Decreased knowledge of use of DME  PT Treatment Interventions Balance training;Gait training;Stair training;Functional mobility training;Therapeutic activities;Therapeutic exercise;Patient/family education;DME instruction;Neuromuscular re-education   PT Goals (Current goals can be found in the Care Plan section) Acute Rehab PT Goals Patient Stated Goal: to get better and go home PT Goal Formulation: With patient Time For Goal Achievement: 05/24/15 Potential to Achieve Goals: Good    Frequency Min 4X/week   Barriers to discharge Inaccessible home environment steps to enter home    Co-evaluation               End of Session Equipment Utilized During Treatment: Gait belt Activity Tolerance: Patient tolerated treatment well Patient left: in chair;with call bell/phone within reach Nurse Communication: Mobility status         Time: 1356-1430 PT Time Calculation (min) (ACUTE ONLY): 34 min   Charges:   PT Evaluation $PT Eval Moderate Complexity: 1 Procedure PT Treatments $Gait Training: 8-22 mins   PT G Codes:        Adalie Mand A Wally Behan 05/10/2015, 4:09 PM Wray Kearns, [place],  DPT [phone]

## 2015-05-10 NOTE — Progress Notes (Signed)
STROKE TEAM PROGRESS NOTE   HISTORY Frank Schneider is a 40 y.o. male with a history of previous stroke in September as well as diabetes, hypertension who presents with left-sided weakness and discoordination that started abruptly at 2 PM today 05/09/2015 (LKW). He was playing [employer] and noticed it came on suddenly. He has numbness of the left side as well, but states that he always has some tingling of the left side of her since a stroke back in September. 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 His A1c of the time was 9.2. When Dr. Erlinda Hong him in November, he had full strength with the exception of left hand dexterity. He did not have any dysmetria. It was felt that his previous infarct was likely due to small vessel disease. He has been feeling generally ill for the past couple of days with mild non-specific dizziness(no vertigo, dysequilibrium). He appears slightly diaphoretic. Patient was administered TPA was admitted to the medical ICU for further evaluation and treatment.   SUBJECTIVE (INTERVAL HISTORY) His RN is at the bedside.  Overall he feels his condition is stable. He reports he is trying to do well, take care of himself.   OBJECTIVE Temp:  [97.6 F (36.4 C)-98.6 F (37 C)] 98.4 F (36.9 C) (01/13 0832) Pulse Rate:  [57-90] 66 (01/13 0900) Cardiac Rhythm:  [-] Normal sinus rhythm (01/13 0800) Resp:  [13-28] 19 (01/13 0900) BP: (81-139)/(45-98) 139/98 mmHg (01/13 0900) SpO2:  [95 %-100 %] 100 % (01/13 0900) Weight:  [91.2 kg (201 lb 1 oz)-94 kg (207 lb 3.7 oz)] 91.2 kg (201 lb 1 oz) (01/12 2005)  CBC:   Recent Labs Lab 05/09/15 1704 05/09/15 1710  WBC 13.7*  --   NEUTROABS 9.9*  --   HGB 13.4 16.7  HCT 40.6 49.0  MCV 60.4*  --   PLT 167  --     Basic Metabolic Panel:   Recent Labs Lab 05/09/15 1704 05/09/15 1710  NA 132* 133*  K 4.7 4.5  CL 95* 94*  CO2 23  --   GLUCOSE 295*  308*  BUN 33* 36*  CREATININE 2.21* 2.00*  CALCIUM 9.1  --     Lipid Panel:     Component Value Date/Time   CHOL 115 05/10/2015 0353   TRIG 211* 05/10/2015 0353   HDL 18* 05/10/2015 0353   CHOLHDL 6.4 05/10/2015 0353   VLDL 42* 05/10/2015 0353   LDLCALC 55 05/10/2015 0353   HgbA1c:  Lab Results  Component Value Date   HGBA1C 7.3* 04/05/2015   Urine Drug Screen:     Component Value Date/Time   LABOPIA NONE DETECTED 05/10/2015 0724   COCAINSCRNUR NONE DETECTED 05/10/2015 0724   LABBENZ NONE DETECTED 05/10/2015 0724   AMPHETMU NONE DETECTED 05/10/2015 0724   THCU POSITIVE* 05/10/2015 0724   LABBARB NONE DETECTED 05/10/2015 0724      IMAGING  Ct Head Wo Contrast 05/09/2015   Normal CT of the brain without intravenous contrast.   Dg Chest Portable 1 View 05/09/2015   Normal exam.    PHYSICAL EXAM Pleasant young African-American male currently not in distress. . Afebrile. Head is nontraumatic. Neck is supple without bruit.    Cardiac exam no murmur or gallop. Lungs are clear to auscultation. Distal pulses are well felt. Neurological Exam :  Awake alert oriented x 3 normal speech and language. Mild left lower face asymmetry.  Tongue midline. No drift. Mild diminished fine finger movements on left. Orbits right over left upper extremity. Mild left grip weak.. Normal sensation . Normal coordination. ASSESSMENT/PLAN Frank Schneider is a 40 y.o. male with history of previous stroke in September, hypertension and diabetes presenting with left-sided weakness. He received IV t-PA 05/09/2015 at 1731.   Stroke:  Presumed Non-dominant right brain subcortical infarct most likely secondary to small vessel disease source  Resultant  Mild L hand weakness with subjective weakness  MRI  pending   MRA  pending   Carotids and echo 2D Echo ok in 12/2015 No source of embolus/ no stenosis. No need to repeat tests  LDL 55  HgbA1c 7.3 in Dec  SCDs for VTE prophylaxis Diet heart  healthy/carb modified Room service appropriate?: Yes; Fluid consistency:: Thin  aspirin 325 mg daily prior to admission taking routinely prior to admission per pt,  now on No antithrombotic as within 24 hours of TPA administration. Resume aspirin in imaging neg for hmg at 24h.  Ongoing aggressive stroke risk factor management  Therapy recommendations:  Pending. Ok to be OOB. Have therapy evaluate.  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 Better with his mother's medication. He is going to get the name of it -> HCTZ   Other Active Problems  Blind right eye  acute kidney injury, creatinine 2.0  Hospital day # [place] for Pager information 05/10/2015 10:32 AM  I have personally examined this patient, reviewed notes, independently viewed imaging studies, participated in medical decision making and plan of care. I have made any additions or clarifications directly to the above note. Agree with note above. Patient presented with worsening of his mild left hemiparesis secondary to likely  new right brain subcortical infarct. He received IV tPA and seems to be improving. He however remains at risk for neurological worsening, post hemorrhage, recurrent stroke, TIA needs ongoing close neurological monitoring and aggressive blood pressure control. Repeat brain scan and 24 hours. Nebulizer out of bed. Physical occupational therapy consults. Discontinue further stroke evaluation testing as he had a  complete evaluation with his last stroke in September 2016. Patient strongly counseled to quit smoking  Cigarettes and marijuana and is agreeable. This patient is critically ill and at significant risk of neurological worsening, death and care requires constant monitoring of vital signs, hemodynamics,respiratory and cardiac monitoring, extensive review of multiple databases, frequent neurological assessment, discussion with family, other specialists and medical decision making of high complexity.I have made any additions or clarifications directly to the above note.This critical care time does not reflect procedure time, or teaching time or supervisory time of PA/NP/Med Resident etc but could involve care discussion time.  I spent 30 minutes of neurocritical care time  in the care of  this patient.    Antony Contras, MD Medical Director [facility] Pager: [phone] 05/10/2015 4:56 PM    To contact Stroke Continuity provider, please refer to [email or web address] After hours, contact General Neurology

## 2015-05-10 NOTE — Progress Notes (Signed)
SLP Cancellation Note  Patient Details Name: Damontez Bayuk MRN: [id number] DOB: 08-01-1975   Cancelled treatment:       Reason Eval/Treat Not Completed: SLP screened, no needs identified, will sign off   Germaine Ripp, Katherene Ponto 05/10/2015, 11:09 AM

## 2015-05-11 DIAGNOSIS — I639 Cerebral infarction, unspecified: Secondary | ICD-10-CM | POA: Insufficient documentation

## 2015-05-11 DIAGNOSIS — D72829 Elevated white blood cell count, unspecified: Secondary | ICD-10-CM | POA: Insufficient documentation

## 2015-05-11 LAB — GLUCOSE, CAPILLARY
GLUCOSE-CAPILLARY: 235 mg/dL — AB (ref 65–99)
Glucose-Capillary: 262 mg/dL — ABNORMAL HIGH (ref 65–99)
Glucose-Capillary: 134 mg/dL — ABNORMAL HIGH (ref 65–99)
Glucose-Capillary: 180 mg/dL — ABNORMAL HIGH (ref 65–99)

## 2015-05-11 LAB — CBC
HCT: 37.4 % — ABNORMAL LOW (ref 39.0–52.0)
HEMOGLOBIN: 12.1 g/dL — AB (ref 13.0–17.0)
MCH: 20.1 pg — ABNORMAL LOW (ref 26.0–34.0)
MCHC: 32.4 g/dL (ref 30.0–36.0)
MCV: 62.2 fL — ABNORMAL LOW (ref 78.0–100.0)
PLATELETS: 147 10*3/uL — AB (ref 150–400)
RBC: 6.01 MIL/uL — AB (ref 4.22–5.81)
RDW: 15.1 % (ref 11.5–15.5)
WBC: 7.8 10*3/uL (ref 4.0–10.5)

## 2015-05-11 LAB — HEMOGLOBIN A1C
Hgb A1c MFr Bld: 8.2 % — ABNORMAL HIGH (ref 4.8–5.6)
MEAN PLASMA GLUCOSE: 189 mg/dL

## 2015-05-11 LAB — COMPREHENSIVE METABOLIC PANEL
ALBUMIN: 3.6 g/dL (ref 3.5–5.0)
ALK PHOS: 51 U/L (ref 38–126)
ALT: 14 U/L — AB (ref 17–63)
AST: 15 U/L (ref 15–41)
Anion gap: 6 (ref 5–15)
BUN: 11 mg/dL (ref 6–20)
CHLORIDE: 101 mmol/L (ref 101–111)
CO2: 30 mmol/L (ref 22–32)
CREATININE: 0.96 mg/dL (ref 0.61–1.24)
Calcium: 8.7 mg/dL — ABNORMAL LOW (ref 8.9–10.3)
GFR calc non Af Amer: >60 mL/min (ref >60)
GLUCOSE: 139 mg/dL — AB (ref 65–99)
Potassium: 4.4 mmol/L (ref 3.5–5.1)
SODIUM: 137 mmol/L (ref 135–145)
Total Bilirubin: 0.3 mg/dL (ref 0.3–1.2)
Total Protein: 6.3 g/dL — ABNORMAL LOW (ref 6.5–8.1)

## 2015-05-11 LAB — TSH: TSH: 2.06 u[IU]/mL (ref 0.350–4.500)

## 2015-05-11 LAB — VITAMIN B12: Vitamin B-12: 447 pg/mL (ref 180–914)

## 2015-05-11 LAB — FOLATE: FOLATE: 13 ng/mL (ref >5.9)

## 2015-05-11 MED ORDER — AMLODIPINE BESYLATE 10 MG PO TABS
10.0000 mg | ORAL_TABLET | Freq: Every day | ORAL | Status: DC
  Administered 2015-05-11 – 2015-05-12 (×2): 10 mg via ORAL
  Filled 2015-05-11 (×2): qty 1

## 2015-05-11 MED ORDER — INSULIN GLARGINE 100 UNIT/ML ~~LOC~~ SOLN
20.0000 [IU] | Freq: Every day | SUBCUTANEOUS | Status: DC
  Administered 2015-05-11 – 2015-05-12 (×2): 20 [IU] via SUBCUTANEOUS
  Filled 2015-05-11 (×3): qty 0.2

## 2015-05-11 MED ORDER — CLOPIDOGREL BISULFATE 75 MG PO TABS
75.0000 mg | ORAL_TABLET | Freq: Every day | ORAL | Status: DC
  Administered 2015-05-11 – 2015-05-12 (×2): 75 mg via ORAL
  Filled 2015-05-11 (×2): qty 1

## 2015-05-11 MED ORDER — GABAPENTIN 100 MG PO CAPS
200.0000 mg | ORAL_CAPSULE | Freq: Three times a day (TID) | ORAL | Status: DC
  Administered 2015-05-11 – 2015-05-12 (×4): 200 mg via ORAL
  Filled 2015-05-11 (×4): qty 2

## 2015-05-11 MED ORDER — FERROUS SULFATE 325 (65 FE) MG PO TABS
325.0000 mg | ORAL_TABLET | Freq: Every day | ORAL | Status: DC
  Administered 2015-05-12: 325 mg via ORAL
  Filled 2015-05-11: qty 1

## 2015-05-11 NOTE — Progress Notes (Addendum)
STROKE TEAM PROGRESS NOTE   HISTORY Frank Schneider is a 40 y.o. male with a history of previous stroke in September as well as diabetes, hypertension who presents with left-sided weakness and discoordination that started abruptly at 2 PM today 05/09/2015 (LKW). He was playing [employer] and noticed it came on suddenly. He has numbness of the left side as well, but states that he always has some tingling of the left side of her since a stroke back in September. 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 His A1c of the time was 9.2. When Dr. Erlinda Hong him in November, he had full strength with the exception of left hand dexterity. He did not have any dysmetria. It was felt that his previous infarct was likely due to small vessel disease. He has been feeling generally ill for the past couple of days with mild non-specific dizziness(no vertigo, dysequilibrium). He appears slightly diaphoretic. Patient was administered TPA was admitted to the medical ICU for further evaluation and treatment.   SUBJECTIVE (INTERVAL HISTORY) His RN is at the bedside.  Overall he feels his condition is stable. He reports he is trying to do well, take care of himself.  He reports HA and states that it gets better with Tylenol.  He reports difficulty remaining asleep and once awake, difficulty going back to sleep.  This has been a recent problem.  He now takes Tylenol PM to address.  He was recently put on gabapenting for "nerve pain" and cramping about one month ago.  Cramping is better since in the hospital but the numbness in feet remains.   OBJECTIVE Temp:  [97.4 F (36.3 C)-98.8 F (37.1 C)] 97.8 F (36.6 C) (01/14 0900) Pulse Rate:  [48-84] 64 (01/14 1000) Cardiac Rhythm:  [-] Sinus bradycardia (01/14 0800) Resp:  [9-29] 19 (01/14 1000) BP: (113-150)/(58-91) 131/73 mmHg (01/14 1000) SpO2:  [95 %-100 %] 100 % (01/14 1000)  CBC: pending for  today  Recent Labs Lab 05/09/15 1704 05/09/15 1710  WBC 13.7*  --   NEUTROABS 9.9*  --   HGB 13.4 16.7  HCT 40.6 49.0  MCV 60.4*  --   PLT 167  --     Basic Metabolic Panel: pending for today  Recent Labs Lab 05/09/15 1704 05/09/15 1710  NA 132* 133*  K 4.7 4.5  CL 95* 94*  CO2 23  --   GLUCOSE 295* 308*  BUN 33* 36*  CREATININE 2.21* 2.00*  CALCIUM 9.1  --     Lipid Panel:     Component Value Date/Time   CHOL 115 05/10/2015 0353   TRIG 211* 05/10/2015 0353   HDL 18* 05/10/2015 0353   CHOLHDL 6.4 05/10/2015 0353   VLDL 42* 05/10/2015 0353   LDLCALC 55 05/10/2015 0353   HgbA1c:  Lab Results  Component Value Date   HGBA1C 8.2* 05/10/2015   Urine Drug Screen:     Component Value Date/Time   LABOPIA NONE DETECTED 05/10/2015 0724   COCAINSCRNUR NONE DETECTED 05/10/2015 0724   LABBENZ NONE DETECTED 05/10/2015 0724   AMPHETMU NONE DETECTED 05/10/2015 0724   THCU POSITIVE* 05/10/2015 0724   LABBARB NONE DETECTED 05/10/2015 0724      IMAGING  Ct Head Wo Contrast 05/09/2015   Normal CT of the brain without intravenous contrast.    CT head without contrast 05/10/2015 No visible acute stroke on this noncontrast CT scan. No intracranial  blood is observed post tPA to suggest a hemorrhagic complication.  Dg Chest Portable 1 View 05/09/2015   Normal exam.   MRI / MRA Head / Brain 05/10/2015  MRI HEAD:  7 x 13 mm acute ischemia RIGHT ventral pons. Mild chronic small vessel ischemic disease.  MRA HEAD:  Negative motion degraded MRA head.  PHYSICAL EXAM Pleasant young African-American male currently not in distress. . Afebrile. Head is nontraumatic. Neck is supple without bruit.    Cardiac exam no murmur or gallop.  Lungs are clear to auscultation.  Abd:  NT/ND/normal bowel sounds Distal pulses are well felt.  Neurological Exam :  Awake alert oriented x 3 normal speech and language.   Cranial Nerves: Left pupil round and responsive; right pupil  obscured (chronic) Mild left lower face asymmetry. Tongue midline.   Motor Exam: No drift. Mild diminished fine finger movements on left. Mild left grip weak..   Sensation . Normal coordination.  Coordination:  Finger to nose intact  Gait deferred  ASSESSMENT/PLAN Frank Schneider is a 40 y.o. male with history of previous stroke in September, hypertension and diabetes presenting with left-sided weakness. He received IV t-PA 05/09/2015 at 1731.   Stroke:  Presumed Non-dominant right brain subcortical infarct most likely secondary to small vessel disease source  Resultant  Mild L hand weakness with subjective weakness  MRI  7 x 13 mm acute ischemia RIGHT ventral pons.  MRA  negative  Carotids and echo 2D Echo ok in 12/2015 No source of embolus/ no stenosis. No need to repeat tests  LDL 55  HgbA1c 7.3 in Dec now 8.2  SCDs for VTE prophylaxis Diet heart healthy/carb modified Room service appropriate?: Yes; Fluid consistency:: Thin  aspirin 325 mg daily prior to admission taking routinely prior to admission per pt,  now on No antithrombotic as within 24 hours of TPA administration. Resume aspirin in imaging neg for hmg at 24h.  Ongoing aggressive stroke risk factor management  Therapy recommendations:  Pending. Ok to be OOB. Have therapy evaluate.  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 Better with his mother's medication. He is going to get the name of it -> HCTZ   Other Active Problems  Blind right  eye  acute kidney injury, creatinine 2.0  ATTENDING NOTE: Patient was seen and examined by me personally. Documentation reflects findings. The laboratory and radiographic studies reviewed by me. ROS completed by me personally and pertinent positives fully documented Condition:  Stable for transfer to floor  Assessment and plan completed by me personally and fully documented above. Plans/Recommendations include:     Check labs today:  HIV, B12, folate, TSH, chemistry and CBC  Add antiplatelet - Start Plavix today  D/C IVFs  Restart home meds except for Lisinopril (elevated creatinine).  Started Norvasc 10 mg daily instead.  Also increased Gabapentin to 200mg  TID   Hospital day # [place] for Pager information 05/11/2015 10:55 AM       To  contact Stroke Continuity provider, please refer to [email or web address] After hours, contact General Neurology

## 2015-05-11 NOTE — Progress Notes (Addendum)
Attempted to call report to 5C 

## 2015-05-11 NOTE — Progress Notes (Signed)
Transfer report received from 27M at 1630 and pt arrived to the unit via wheelchair with belongings to the side at 1700. VSS; telemetry applied and verified with CCMD; confirmed by NT; pt A&O x4; pt denies any pain, skin intact with no pressure ulcer noted. Pt oriented to the unit and room; call light within reach. Will closely monitor pt. Delia Heady RN

## 2015-05-11 NOTE — Progress Notes (Signed)
Notified Dr. Belenda Cruise about pts blood sugar being elevated. Awaiting orders at this time.

## 2015-05-11 NOTE — Progress Notes (Signed)
Physical Therapy Treatment Patient Details Name: Frank Schneider MRN: [id number] DOB: 28-Jan-1976 Today's Date: 05/11/2015    History of Present Illness Patient is a 40 y/o male with hx of HTN, DM, blindness right eye and CVA (9/16) presents with left-sided weakness and discoordination. Head CT- unremarkable. Found to have presumed Non-dominant right brain subcortical infarct most likely secondary to small vessel disease source. s/p tPA.    PT Comments    Pt with improved gait balance today without AD until challenges presented. Pt would benefit from OPPT to address higher level balance and left leg strengthening/stability. Acute PT to continue.  Follow Up Recommendations  Outpatient PT;Supervision - Intermittent     Equipment Recommendations  Other (comment) (none)    Recommendations for Other Services OT consult     Precautions / Restrictions Precautions Precaution Comments: blindness right eye Restrictions Weight Bearing Restrictions: No    Mobility  Bed Mobility   Bed Mobility: Supine to Sit;Sit to Supine     Supine to sit: Modified independent (Device/Increase time);HOB elevated Sit to supine: Modified independent (Device/Increase time)   General bed mobility comments: Increased time. Use of rail for support.   Transfers Overall transfer level: Needs assistance Equipment used: None Transfers: Sit to/from Stand Sit to Stand: Supervision         General transfer comment: supervision for safety due to multiple lines/leads.  Ambulation/Gait Ambulation/Gait assistance: Min guard;Supervision Ambulation Distance (Feet): 500 Feet Assistive device: None Gait Pattern/deviations: Step-through pattern;Decreased stride length;Staggering left;Staggering right;Decreased weight shift to left;Narrow base of support Gait velocity: decreased Gait velocity interpretation: Below normal speed for age/gender General Gait Details: supervision for gait on straight pathways, min  guard assist if involved in dynamic task with gait (head movements, speed changes, etc) due to instability/veering      Balance Overall balance assessment: Needs assistance         Standing balance support: No upper extremity supported;During functional activity Standing balance-Leahy Scale: Fair   Single Leg Stance - Right Leg: 10 (min guard assist for balance) Single Leg Stance - Left Leg: 0 (unable to hold without assistance) Tandem Stance - Right Leg: 10 (min assist for balance, left>right lean) Tandem Stance - Left Leg: 10 (min assist due to imbalance, left>right lean)   Rhomberg - Eyes Closed: 10 (min assist with left lateral lean) High level balance activites: Head turns;Other (comment) High Level Balance Comments: looking left, looking right, speed changes, quick turns, min guard to min assist for balance with these activites.    Cognition Arousal/Alertness: Awake/alert Behavior During Therapy: WFL for tasks assessed/performed Overall Cognitive Status: Within Functional Limits for tasks assessed                       Pertinent Vitals/Pain Pain Assessment: No/denies pain     PT Goals (current goals can now be found in the care plan section) Acute Rehab PT Goals Patient Stated Goal: to get better and go home PT Goal Formulation: With patient Time For Goal Achievement: 05/24/15 Potential to Achieve Goals: Good Progress towards PT goals: Progressing toward goals    Frequency  Min 4X/week    PT Plan Discharge plan needs to be updated    End of Session Equipment Utilized During Treatment: Gait belt Activity Tolerance: Patient tolerated treatment well Patient left: in bed;with call bell/phone within reach     Time: [id number] PT Time Calculation (min) (ACUTE ONLY): 12 min  Charges:  $Neuromuscular Re-education: 8-22 mins  Willow Ora 05/11/2015, 3:53 PM  Willow Ora, PTA, [facility] [phone] 05/11/2015, 3:54  PM

## 2015-05-12 DIAGNOSIS — E118 Type 2 diabetes mellitus with unspecified complications: Secondary | ICD-10-CM

## 2015-05-12 DIAGNOSIS — E1165 Type 2 diabetes mellitus with hyperglycemia: Secondary | ICD-10-CM

## 2015-05-12 DIAGNOSIS — I6330 Cerebral infarction due to thrombosis of unspecified cerebral artery: Secondary | ICD-10-CM

## 2015-05-12 LAB — BASIC METABOLIC PANEL
ANION GAP: 4 — AB (ref 5–15)
BUN: 10 mg/dL (ref 6–20)
CHLORIDE: 104 mmol/L (ref 101–111)
CO2: 29 mmol/L (ref 22–32)
Calcium: 9 mg/dL (ref 8.9–10.3)
Creatinine, Ser: 1.03 mg/dL (ref 0.61–1.24)
GFR calc non Af Amer: >60 mL/min (ref >60)
Glucose, Bld: 231 mg/dL — ABNORMAL HIGH (ref 65–99)
POTASSIUM: 4.4 mmol/L (ref 3.5–5.1)
SODIUM: 137 mmol/L (ref 135–145)

## 2015-05-12 LAB — CBC
HCT: 37.7 % — ABNORMAL LOW (ref 39.0–52.0)
HEMOGLOBIN: 12.2 g/dL — AB (ref 13.0–17.0)
MCH: 19.9 pg — AB (ref 26.0–34.0)
MCHC: 32.4 g/dL (ref 30.0–36.0)
MCV: 61.6 fL — ABNORMAL LOW (ref 78.0–100.0)
Platelets: 157 10*3/uL (ref 150–400)
RBC: 6.12 MIL/uL — AB (ref 4.22–5.81)
RDW: 15.2 % (ref 11.5–15.5)
WBC: 8.2 10*3/uL (ref 4.0–10.5)

## 2015-05-12 LAB — HIV ANTIBODY (ROUTINE TESTING W REFLEX): HIV Screen 4th Generation wRfx: NONREACTIVE

## 2015-05-12 LAB — GLUCOSE, CAPILLARY
GLUCOSE-CAPILLARY: 200 mg/dL — AB (ref 65–99)
GLUCOSE-CAPILLARY: 157 mg/dL — AB (ref 65–99)

## 2015-05-12 MED ORDER — CLOPIDOGREL BISULFATE 75 MG PO TABS: 75.0000 mg | ORAL_TABLET | Freq: Every day | ORAL | Status: DC

## 2015-05-12 MED ORDER — AMLODIPINE BESYLATE 10 MG PO TABS: 10.0000 mg | ORAL_TABLET | Freq: Every day | ORAL | Status: DC

## 2015-05-12 MED ORDER — INSULIN GLARGINE 100 UNIT/ML ~~LOC~~ SOLN: 20.0000 [IU] | Freq: Every day | SUBCUTANEOUS | Status: DC

## 2015-05-12 MED ORDER — GABAPENTIN 100 MG PO CAPS: 200.0000 mg | ORAL_CAPSULE | Freq: Three times a day (TID) | ORAL | Status: DC

## 2015-05-12 NOTE — Discharge Instructions (Signed)
1. Stop smoking 2. Follow a strict diabetic diet 3. Gradually increase activity as tolerated. Initial supervision recommended for safety. 4. May return to work in 4 weeks or as instructed by MD or Physical Therapist. 5. No driving until cleared by MD or Physical Therapist. 6. Outpatient physical therapy will be scheduled.

## 2015-05-12 NOTE — Progress Notes (Signed)
Physical Therapy Treatment Patient Details Name: Frank Schneider MRN: [id number] DOB: January 05, 1976 Today's Date: 05/12/2015    History of Present Illness Patient is a 40 y/o male with hx of HTN, DM, blindness right eye and CVA (9/16) presents with left-sided weakness and discoordination. Head CT- unremarkable. Found to have presumed Non-dominant right brain subcortical infarct most likely secondary to small vessel disease source. s/p tPA.    PT Comments    Pt pleasant & willing to participate in PT session.  Completed stair training today.  Patient safe to D/C from a mobility standpoint based on progression towards goals set on PT eval.    Follow Up Recommendations  Outpatient PT;Supervision - Intermittent     Equipment Recommendations       Recommendations for Other Services OT consult     Precautions / Restrictions Restrictions Weight Bearing Restrictions: No    Mobility  Bed Mobility Overal bed mobility: Modified Independent                Transfers Overall transfer level: Modified independent                  Ambulation/Gait Ambulation/Gait assistance: Min guard Ambulation Distance (Feet): 600 Feet Assistive device: None Gait Pattern/deviations: Step-through pattern     General Gait Details: close supervision for safety due to pt states legs feel a little weak and may buckle but no buckling noted.     Stairs Stairs: Yes Stairs assistance: Min guard Stair Management: One rail Left;Step to pattern Number of Stairs: 4 General stair comments: cues for technique and increased safety awareness of foot placement on step  Wheelchair Mobility    Modified Rankin (Stroke Patients Only)       Balance                                    Cognition Arousal/Alertness: Awake/alert Behavior During Therapy: WFL for tasks assessed/performed Overall Cognitive Status: Within Functional Limits for tasks assessed                       Exercises      General Comments        Pertinent Vitals/Pain Pain Assessment: No/denies pain    Home Living                      Prior Function            PT Goals (current goals can now be found in the care plan section) Acute Rehab PT Goals Patient Stated Goal: to get better and go home PT Goal Formulation: With patient Time For Goal Achievement: 05/24/15 Potential to Achieve Goals: Good Progress towards PT goals: Progressing toward goals    Frequency  Min 4X/week    PT Plan Discharge plan needs to be updated    Co-evaluation             End of Session Equipment Utilized During Treatment: Gait belt Activity Tolerance: Patient tolerated treatment well Patient left: in bed;with call bell/phone within reach;with bed alarm set     Time: [id number] PT Time Calculation (min) (ACUTE ONLY): 30 min  Charges:  $Gait Training: 23-37 mins                    G Codes:      Sena Hitch 05/12/2015, 1:11 PM   Sarajane Marek, PTA [phone]  05/12/2015   

## 2015-05-12 NOTE — Progress Notes (Addendum)
Patient discharged home discharge summary reviewed and prescription provided IV's removed, will ask if patient is interested in [facility] program will transport downstairs with wheel chair he is alert and oriented with on complaints of pain.

## 2015-05-12 NOTE — Discharge Summary (Signed)
Stroke Discharge Summary  Patient ID: Frank Schneider   MRN: [id number]      DOB: 18-Jul-1975  Date of Admission: 05/09/2015 Date of Discharge: 05/12/2015  Attending Physician:  Garvin Fila, MD, Stroke MD  Consulting Physician(s):   Treatment Team:  Md Stroke, MD None  Patient's PCP:  Dorena Dew, FNP  DISCHARGE DIAGNOSIS: 7 x 13 mm acute ischemia RIGHT ventral pons. Treated with TPA. Active Problems:   Stroke (cerebrum) (HCC)   Cerebrovascular accident (CVA) ([place])   Leukocytosis  BMI: Body mass index is 28.85 kg/(m^2).  Past Medical History  Diagnosis Date  . Hypertension   . Priapism   . Tobacco use   . Diabetes mellitus     Takes Metformin  . Stroke ([place])   . Vision abnormalities   . Blind right eye    Past Surgical History  Procedure Laterality Date  . Penectomy    . Incision and drainage perirectal abscess  07/28/2011    Procedure: IRRIGATION AND DEBRIDEMENT PERIRECTAL ABSCESS;  Surgeon: Adin Hector, MD;  Location: [facility];  Service: General;  Laterality: N/A;   of skin muscle and subcutaneous tissue of perimeum  8cmx12cm area       Medication List    STOP taking these medications        aspirin 325 MG tablet     lisinopril 20 MG tablet  Commonly known as:  PRINIVIL,ZESTRIL      TAKE these medications        amLODipine 10 MG tablet  Commonly known as:  NORVASC  Take 1 tablet (10 mg total) by mouth daily.     clopidogrel 75 MG tablet  Commonly known as:  PLAVIX  Take 1 tablet (75 mg total) by mouth daily.     ferrous sulfate 325 (65 FE) MG tablet  Take 1 tablet (325 mg total) by mouth daily with breakfast.     freestyle lancets  Use as instructed     gabapentin 100 MG capsule  Commonly known as:  NEURONTIN  Take 2 capsules (200 mg total) by mouth 3 (three) times daily.     glucose blood test strip  Commonly known as:  TRUE METRIX BLOOD GLUCOSE TEST  Use as instructed     insulin glargine 100 UNIT/ML injection  Commonly known  as:  LANTUS  Inject 0.2 mLs (20 Units total) into the skin daily.     INSULIN SYRINGE .5CC/30GX1/2" 30G X 1/2" 0.5 ML Misc  100     TRUE METRIX AIR GLUCOSE METER Devi  1 each by Does not apply route 2 (two) times daily at 10 AM and 5 PM.        LABORATORY STUDIES CBC    Component Value Date/Time   WBC 8.2 05/12/2015 0535   RBC 6.12* 05/12/2015 0535   HGB 12.2* 05/12/2015 0535   HCT 37.7* 05/12/2015 0535   PLT 157 05/12/2015 0535   MCV 61.6* 05/12/2015 0535   MCH 19.9* 05/12/2015 0535   MCHC 32.4 05/12/2015 0535   RDW 15.2 05/12/2015 0535   LYMPHSABS 2.9 05/09/2015 1704   MONOABS 0.8 05/09/2015 1704   EOSABS 0.1 05/09/2015 1704   BASOSABS 0.0 05/09/2015 1704   CMP    Component Value Date/Time   NA 137 05/12/2015 0535   K 4.4 05/12/2015 0535   CL 104 05/12/2015 0535   CO2 29 05/12/2015 0535   GLUCOSE 231* 05/12/2015 0535   BUN 10 05/12/2015 0535   CREATININE  1.03 05/12/2015 0535   CREATININE 0.95 01/15/2015 1142   CALCIUM 9.0 05/12/2015 0535   PROT 6.3* 05/11/2015 1700   ALBUMIN 3.6 05/11/2015 1700   AST 15 05/11/2015 1700   ALT 14* 05/11/2015 1700   ALKPHOS 51 05/11/2015 1700   BILITOT 0.3 05/11/2015 1700   GFRNONAA >60 05/12/2015 0535   GFRNONAA >89 01/15/2015 1142   GFRAA >60 05/12/2015 0535   GFRAA >89 01/15/2015 1142   COAGS Lab Results  Component Value Date   INR 1.06 05/09/2015   INR 0.99 12/31/2014   INR 1.17 07/28/2011   Lipid Panel    Component Value Date/Time   CHOL 115 05/10/2015 0353   TRIG 211* 05/10/2015 0353   HDL 18* 05/10/2015 0353   CHOLHDL 6.4 05/10/2015 0353   VLDL 42* 05/10/2015 0353   LDLCALC 55 05/10/2015 0353   HgbA1C  Lab Results  Component Value Date   HGBA1C 8.2* 05/10/2015   Cardiac Panel (last 3 results) No results for input(s): CKTOTAL, CKMB, TROPONINI, RELINDX in the last 72 hours. Urinalysis    Component Value Date/Time   COLORURINE YELLOW 05/10/2015 0724   APPEARANCEUR CLEAR 05/10/2015 0724   LABSPEC  1.016 05/10/2015 0724   PHURINE 6.0 05/10/2015 0724   GLUCOSEU 500* 05/10/2015 0724   HGBUR NEGATIVE 05/10/2015 0724   BILIRUBINUR NEGATIVE 05/10/2015 0724   KETONESUR NEGATIVE 05/10/2015 0724   PROTEINUR NEGATIVE 05/10/2015 0724   UROBILINOGEN 0.2 02/14/2015 1118   NITRITE NEGATIVE 05/10/2015 0724   LEUKOCYTESUR NEGATIVE 05/10/2015 0724   Urine Drug Screen     Component Value Date/Time   LABOPIA NONE DETECTED 05/10/2015 0724   COCAINSCRNUR NONE DETECTED 05/10/2015 0724   LABBENZ NONE DETECTED 05/10/2015 0724   AMPHETMU NONE DETECTED 05/10/2015 0724   THCU POSITIVE* 05/10/2015 0724   LABBARB NONE DETECTED 05/10/2015 0724    Alcohol Level    Component Value Date/Time   ETH <5 05/09/2015 1700     SIGNIFICANT DIAGNOSTIC STUDIES  Ct Head Wo Contrast 05/09/2015 Normal CT of the brain without intravenous contrast.   CT head without contrast 05/10/2015 No visible acute stroke on this noncontrast CT scan. No intracranial blood is observed post tPA to suggest a hemorrhagic complication.  Dg Chest Portable 1 View 05/09/2015 Normal exam.   MRI / MRA Head / Brain 05/10/2015  MRI HEAD:  7 x 13 mm acute ischemia RIGHT ventral pons. Mild chronic small vessel ischemic disease.  MRA HEAD:  Negative motion degraded MRA head.      HISTORY OF PRESENT ILLNESS Frank Schneider is a 40 y.o. male with a history of previous stroke in September as well as diabetes and hypertension who presented with left-sided weakness and discoordination that started abruptly at 2 PM on the day of admission, 05/09/2015 ([facility]). He was playing [employer] and noticed it came on suddenly. He had numbness of the left side as well, but stated that he always has some tingling of the left side ever since a stroke back in September. With the stroke in September he had a CTA head and neck which were negative. On MRI it appeared to be a thalamic perforator. His A1c at the time was 9.2. When Dr. Erlinda Hong saw him in  November, he had full strength with the exception of left hand dexterity. He did not have any dysmetria. It was felt that his previous infarct was likely due to small vessel disease. He had been feeling generally ill for the couple of days prior to admission with mild non-specific  dizziness(no vertigo, dysequilibrium). He appeared slightly diaphoretic. The patient was administered TPA  and was admitted to the medical ICU for further evaluation and treatment.  HOSPITAL COURSE Mr. Fortino Rogalski is a 40 y.o. male with a history of a previous stroke in September, ongoing tobacco use, substance abuse, hypertension and diabetes presenting with left-sided weakness. He received IV t-PA 05/09/2015 at 1731.   Stroke: Presumed Non-dominant right brain subcortical infarct most likely secondary to small vessel disease source  Resultant Mild L hand weakness with subjective weakness  MRI 7 x 13 mm acute ischemia RIGHT ventral pons.  MRA negative  Carotids and echo 2D Echo ok in 12/2015 No source of embolus/ no stenosis. No need to repeat tests  LDL 55  HgbA1c 7.3 in Dec now 8.2  SCDs for VTE prophylaxis  Diet heart healthy/carb modified Room service appropriate?: Yes; Fluid consistency:: Thin  aspirin 325 mg daily prior to admission taking routinely prior to admission per pt, Now on Plavix  Ongoing aggressive stroke risk factor management  Therapy recommendations: Outpatient physical therapy recommended.  Disposition: Discharged to home.  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 Better with his mother's medication. He is going to get the name of it -> HCTZ  Other Active Problems  Blind right eye  acute kidney injury, creatinine 2.0 (now 1.03)  Other labs: HIV (non reactive),  B12 (447),  folate (13) , TSH (2.060),    DISCHARGE EXAM Blood pressure 153/73, pulse 59, temperature 98.1 F (36.7 C), temperature source Oral, resp. rate 16, height 5\' 10"  (1.778 m), weight 91.2 kg (201 lb 1 oz), SpO2 100 %.   PHYSICAL EXAM Pleasant young African-American male currently not in distress. . Afebrile. Head is nontraumatic. Neck is supple without bruit. Cardiac exam no murmur or gallop.  Lungs are clear to auscultation.  Abd: NT/ND/normal bowel sounds Distal pulses are well felt.  Neurological Exam :  Awake alert oriented x 3 normal speech and language.   Cranial Nerves: Left pupil round and responsive; right pupil obscured (chronic) Mild left lower face asymmetry. Tongue midline.   Motor Exam: No drift. Mild diminished fine finger movements on left. Mild left grip weak..   Sensation . Normal coordination.  Coordination: Finger to nose intact  Gait deferred   Discharge Diet   Diet heart healthy/carb modified Room service appropriate?: Yes; Fluid consistency:: Thin liquids   Patient Discharge Instructions 1. Stop smoking 2. Follow a strict diabetic diet 3. Gradually increase activity as tolerated. Initial supervision recommended for safety. 4. May return to work in 4 weeks or as instructed by MD or Physical Therapist. 5. No driving until cleared by MD or Physical Therapist. 6. Outpatient physical therapy.   DISCHARGE PLAN  Disposition: Discharged to home.  clopidogrel 75 mg daily for secondary stroke prevention.  Follow-up Hollis,Lachina M, FNP in 2 weeks.  Follow-up with Dr. [facility] in 2 months.  30 minutes were spent preparing discharge.  Mikey Bussing PA-C [facility] Pager [phone] 05/12/2015, 4:09 PM

## 2015-05-12 NOTE — Progress Notes (Signed)
STROKE TEAM PROGRESS NOTE   HISTORY Frank Schneider is a 40 y.o. male with a history of previous stroke in September as well as diabetes, hypertension who presents with left-sided weakness and discoordination that started abruptly at 2 PM today 05/09/2015 ([facility]). He was playing [employer] and noticed it came on suddenly. He has numbness of the left side as well, but states that he always has some tingling of the left side of her since a stroke back in September. 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 His A1c of the time was 9.2. When Frank Schneider him in November, he had full strength with the exception of left hand dexterity. He did not have any dysmetria. It was felt that his previous infarct was likely due to small vessel disease. He has been feeling generally ill for the past couple of days with mild non-specific dizziness(no vertigo, dysequilibrium). He appears slightly diaphoretic. Patient was administered TPA was admitted to the medical ICU for further evaluation and treatment.   SUBJECTIVE (INTERVAL HISTORY) His RN is at the bedside.  Overall he feels his condition is stable. He reports he is trying to do well, take care of himself.  He reports HA and states that it gets better with Tylenol.  He reports difficulty remaining asleep and once awake, difficulty going back to sleep.  This has been a recent problem.  He now takes Tylenol PM to address.  He was recently put on gabapenting for "nerve pain" and cramping about one month ago.  Cramping is better since in the hospital but the numbness in feet remains.   OBJECTIVE Temp:  [97.6 F (36.4 C)-98.6 F (37 C)] 98 F (36.7 C) (01/15 0530) Pulse Rate:  [50-67] 54 (01/15 0530) Cardiac Rhythm:  [-] Normal sinus rhythm (01/14 1901) Resp:  [13-23] 16 (01/15 0530) BP: (110-168)/(59-102) 135/73 mmHg (01/15 0530) SpO2:  [97 %-100 %] 100 % (01/15 0530)  CBC: pending for  today  Recent Labs Lab 05/09/15 1704  05/11/15 1700 05/12/15 0535  WBC 13.7*  --  7.8 8.2  NEUTROABS 9.9*  --   --   --   HGB 13.4  < > 12.1* 12.2*  HCT 40.6  < > 37.4* 37.7*  MCV 60.4*  --  62.2* 61.6*  PLT 167  --  147* 157  < > = values in this interval not displayed.  Basic Metabolic Panel: pending for today  Recent Labs Lab 05/11/15 1700 05/12/15 0535  NA 137 137  K 4.4 4.4  CL 101 104  CO2 30 29  GLUCOSE 139* 231*  BUN 11 10  CREATININE 0.96 1.03  CALCIUM 8.7* 9.0    Lipid Panel:     Component Value Date/Time   CHOL 115 05/10/2015 0353   TRIG 211* 05/10/2015 0353   HDL 18* 05/10/2015 0353   CHOLHDL 6.4 05/10/2015 0353   VLDL 42* 05/10/2015 0353   LDLCALC 55 05/10/2015 0353   HgbA1c:  Lab Results  Component Value Date   HGBA1C 8.2* 05/10/2015   Urine Drug Screen:     Component Value Date/Time   LABOPIA NONE DETECTED 05/10/2015 0724   COCAINSCRNUR NONE DETECTED 05/10/2015 0724   LABBENZ NONE DETECTED 05/10/2015 0724   AMPHETMU NONE DETECTED 05/10/2015 0724   THCU POSITIVE* 05/10/2015 0724   LABBARB NONE DETECTED 05/10/2015 0724      IMAGING  Ct Head Wo Contrast 05/09/2015  Normal CT of the brain without intravenous contrast.    CT head without contrast 05/10/2015 No visible acute stroke on this noncontrast CT scan. No intracranial blood is observed post tPA to suggest a hemorrhagic complication.  Dg Chest Portable 1 View 05/09/2015   Normal exam.   MRI / MRA Head / Brain 05/10/2015  MRI HEAD:  7 x 13 mm acute ischemia RIGHT ventral pons. Mild chronic small vessel ischemic disease.  MRA HEAD:  Negative motion degraded MRA head.  PHYSICAL EXAM Pleasant young African-American male currently not in distress. . Afebrile. Head is nontraumatic. Neck is supple without bruit.    Cardiac exam no murmur or gallop.  Lungs are clear to auscultation.  Abd:  NT/ND/normal bowel sounds Distal pulses are well felt.  Neurological Exam :  Awake  alert oriented x 3 normal speech and language.   Cranial Nerves: Left pupil round and responsive; right pupil obscured (chronic) Mild left lower face asymmetry. Tongue midline.   Motor Exam: No drift. Mild diminished fine finger movements on left. Mild left grip weak..   Sensation . Normal coordination.  Coordination:  Finger to nose intact  Gait deferred  ASSESSMENT/PLAN Frank Schneider is a 40 y.o. male with history of previous stroke in September, hypertension and diabetes presenting with left-sided weakness. He received IV t-PA 05/09/2015 at 1731.   Stroke:  Presumed Non-dominant right brain subcortical infarct most likely secondary to small vessel disease source  Resultant  Mild L hand weakness with subjective weakness  MRI  7 x 13 mm acute ischemia RIGHT ventral pons.  MRA  negative  Carotids and echo 2D Echo ok in 12/2015 No source of embolus/ no stenosis. No need to repeat tests  LDL 55  HgbA1c 7.3 in Dec now 8.2  SCDs for VTE prophylaxis Diet heart healthy/carb modified Room service appropriate?: Yes; Fluid consistency:: Thin  aspirin 325 mg daily prior to admission taking routinely prior to admission per pt,  Now on Plavix  Ongoing aggressive stroke risk factor management  Therapy recommendations:  Outpatient physical therapy recommended.  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 Better with his mother's medication. He is going to get the name of it -> HCTZ   Other Active Problems  Blind  right eye  acute kidney injury, creatinine 2.0 (now 1.03)  Labs today:  HIV (non reactive), B12 (447), folate (13) , TSH (2.060), chemistry ( glucose -139 ; Calcium - 8.7 ; TP 6.3 ; ALT - 14) and CBC ( Hb - 12.1 ; Hct. 37.4 ; WBC - 7.8 ; Plt. - 147 ; MCV - low ; MCH - low )    Lowry Ram [facility] Pager [phone] 05/12/2015, 2:16 PM   ATTENDING NOTE: Patient was seen and examined by me personally. Documentation reflects findings. The laboratory and radiographic studies reviewed by me. ROS completed by me personally and pertinent positives fully documented Condition:  Stable for transfer to floor  Assessment and plan completed by me personally and fully documented above. Plans/Recommendations  include:     Check labs today:  HIV (non reactive), B12 (447), folate (13) , TSH (2.060), chemistry ( glucose -139 ; Calcium - 8.7 ; TP 6.3 ; ALT - 14) and CBC ( Hb - 12.1 ; Hct. 37.4 ; WBC - 7.8 ; Plt. - 147 ; MCV - low ; MCH - low )  Add antiplatelet - Start Plavix today  D/C IVFs  Restart home meds except for Lisinopril (elevated creatinine).  Started Norvasc 10 mg daily instead.  (BP - OK)  Also increased Gabapentin to 200mg  TID   Hospital day # 3        To contact Stroke Continuity provider, please refer to [email or web address] After hours, contact General Neurology

## 2015-06-04 MED FILL — CLOPIDOGREL 75 MG TABLET: 75 | 30 days supply | Qty: 30 | Fill #0

## 2015-06-04 MED FILL — ?LISINOPRIL 20 MG TABLET: 20 | 30 days supply | Qty: 30 | Fill #1

## 2015-06-04 MED FILL — ?AMLODIPINE BESYLATE 10 MG: 10 | 30 days supply | Qty: 30 | Fill #0

## 2015-06-12 ENCOUNTER — Other Ambulatory Visit: Payer: Self-pay | Admitting: Internal Medicine

## 2015-06-12 DIAGNOSIS — Z794 Long term (current) use of insulin: Principal | ICD-10-CM

## 2015-06-12 DIAGNOSIS — E118 Type 2 diabetes mellitus with unspecified complications: Principal | ICD-10-CM

## 2015-06-12 DIAGNOSIS — E1165 Type 2 diabetes mellitus with hyperglycemia: Secondary | ICD-10-CM

## 2015-06-12 DIAGNOSIS — IMO0002 Reserved for concepts with insufficient information to code with codable children: Secondary | ICD-10-CM

## 2015-06-12 MED ORDER — INSULIN GLARGINE 100 UNIT/ML ~~LOC~~ SOLN: 20.0000 [IU] | Freq: Every day | SUBCUTANEOUS | Status: DC

## 2015-06-17 ENCOUNTER — Ambulatory Visit: Payer: Self-pay | Admitting: Neurology

## 2015-07-03 ENCOUNTER — Ambulatory Visit: Payer: MEDICAID | Admitting: Neurology

## 2015-07-04 MED FILL — $LANTUS 100 UNITS/ML VIAL: 100 | 50 days supply | Qty: 10 | Fill #3

## 2015-07-11 MED FILL — CLOPIDOGREL 75 MG TABLET: 75 | 30 days supply | Qty: 30 | Fill #1

## 2015-07-11 MED FILL — GABAPENTIN 100 MG CAPSULE: 100 | 10 days supply | Qty: 60 | Fill #0

## 2015-07-11 MED FILL — ?LISINOPRIL 20 MG TABLET: 20 | 30 days supply | Qty: 30 | Fill #2

## 2015-07-11 MED FILL — AMLODIPINE BESYLATE 10 MG T: 10 | 30 days supply | Qty: 30 | Fill #1

## 2015-07-12 ENCOUNTER — Ambulatory Visit: Payer: Self-pay | Admitting: Family Medicine

## 2015-08-07 ENCOUNTER — Encounter: Payer: Self-pay | Admitting: Family Medicine

## 2015-08-07 ENCOUNTER — Ambulatory Visit (INDEPENDENT_AMBULATORY_CARE_PROVIDER_SITE_OTHER): Payer: Self-pay | Admitting: Family Medicine

## 2015-08-07 VITALS — BP 135/67 | HR 70 | Temp 98.1°F | Resp 16 | Ht 70.5 in | Wt 210.0 lb

## 2015-08-07 DIAGNOSIS — D649 Anemia, unspecified: Secondary | ICD-10-CM

## 2015-08-07 DIAGNOSIS — Z720 Tobacco use: Secondary | ICD-10-CM

## 2015-08-07 DIAGNOSIS — I10 Essential (primary) hypertension: Secondary | ICD-10-CM

## 2015-08-07 DIAGNOSIS — E118 Type 2 diabetes mellitus with unspecified complications: Secondary | ICD-10-CM

## 2015-08-07 DIAGNOSIS — G629 Polyneuropathy, unspecified: Secondary | ICD-10-CM

## 2015-08-07 DIAGNOSIS — E1165 Type 2 diabetes mellitus with hyperglycemia: Secondary | ICD-10-CM

## 2015-08-07 DIAGNOSIS — IMO0002 Reserved for concepts with insufficient information to code with codable children: Secondary | ICD-10-CM

## 2015-08-07 LAB — POCT URINALYSIS DIP (DEVICE)
BILIRUBIN URINE: NEGATIVE
GLUCOSE, UA: NEGATIVE mg/dL
Hgb urine dipstick: NEGATIVE
KETONES UR: NEGATIVE mg/dL
Leukocytes, UA: NEGATIVE
Nitrite: NEGATIVE
Protein, ur: NEGATIVE mg/dL
Specific Gravity, Urine: 1.02 (ref 1.005–1.030)
Urobilinogen, UA: 0.2 mg/dL (ref 0.0–1.0)
pH: 5.5 (ref 5.0–8.0)

## 2015-08-07 LAB — GLUCOSE, CAPILLARY: GLUCOSE-CAPILLARY: 210 mg/dL — AB (ref 65–99)

## 2015-08-07 MED ORDER — GABAPENTIN 300 MG PO CAPS: 300.0000 mg | ORAL_CAPSULE | Freq: Three times a day (TID) | ORAL | Status: DC

## 2015-08-07 MED ORDER — LISINOPRIL 20 MG PO TABS: 20.0000 mg | ORAL_TABLET | Freq: Every day | ORAL | Status: DC

## 2015-08-07 MED FILL — GABAPENTIN 300 MG CAPSULE: 300 | 30 days supply | Qty: 90 | Fill #0

## 2015-08-07 MED FILL — ?LISINOPRIL 20 MG TABLET: 20 | 30 days supply | Qty: 30 | Fill #0

## 2015-08-07 NOTE — Progress Notes (Signed)
Subjective:    Patient ID: Frank Schneider, male    DOB: 1975-11-08, 40 y.o.   MRN: [id number]  Hypertension This is a chronic problem. The current episode started more than 1 month ago. The problem is unchanged. The problem is controlled. Pertinent negatives include no anxiety, blurred vision, chest pain, headaches, malaise/fatigue, neck pain, orthopnea, palpitations, peripheral edema, PND, shortness of breath or sweats. Risk factors for coronary artery disease include diabetes mellitus, male gender and smoking/tobacco exposure. Past treatments include ACE inhibitors and calcium channel blockers. The current treatment provides significant improvement. There are no compliance problems.   Diabetes He presents for his follow-up diabetic visit. He has type 2 diabetes mellitus. His disease course has been stable. Pertinent negatives for hypoglycemia include no confusion, dizziness, headaches, nervousness/anxiousness, pallor, seizures, sleepiness, speech difficulty, sweats or tremors. Associated symptoms include foot paresthesias. Pertinent negatives for diabetes include no blurred vision, no chest pain, no fatigue, no foot ulcerations, no polydipsia, no polyphagia, no polyuria, no visual change and no weakness. Pertinent negatives for hypoglycemia complications include no blackouts, no nocturnal hypoglycemia and no required glucagon injection. Symptoms are stable. Risk factors for coronary artery disease include hypertension and male sex. Current diabetic treatment includes insulin injections. He is compliant with treatment all of the time. He is currently taking insulin at bedtime. Insulin injections are given by patient. Rotation sites for injection include the abdominal wall. His weight is stable. He is following a diabetic diet. He has not had a previous visit with a dietitian. He rarely participates in exercise. His breakfast blood glucose is taken between 9-10 am. His breakfast blood glucose range is  generally 130-140 mg/dl. An ACE inhibitor/angiotensin II receptor blocker is being taken. He does not see a podiatrist.Eye exam is not current.    Past Medical History  Diagnosis Date  . Hypertension   . Priapism   . Tobacco use   . Diabetes mellitus     Takes Metformin  . Stroke ([place])   . Vision abnormalities   . Blind right eye      Medication List       This list is accurate as of: 08/07/15 10:57 AM.  Always use your most recent med list.               amLODipine 10 MG tablet  Commonly known as:  NORVASC  Take 1 tablet (10 mg total) by mouth daily.     clopidogrel 75 MG tablet  Commonly known as:  PLAVIX  Take 1 tablet (75 mg total) by mouth daily.     ferrous sulfate 325 (65 FE) MG tablet  Take 1 tablet (325 mg total) by mouth daily with breakfast.     freestyle lancets  Use as instructed     gabapentin 100 MG capsule  Commonly known as:  NEURONTIN  Take 2 capsules (200 mg total) by mouth 3 (three) times daily.     glucose blood test strip  Commonly known as:  TRUE METRIX BLOOD GLUCOSE TEST  Use as instructed     insulin glargine 100 UNIT/ML injection  Commonly known as:  LANTUS  Inject 0.2 mLs (20 Units total) into the skin daily.     INSULIN SYRINGE .5CC/30GX1/2" 30G X 1/2" 0.5 ML Misc  100     TRUE METRIX AIR GLUCOSE METER Devi  1 each by Does not apply route 2 (two) times daily at 10 AM and 5 PM.       Immunization History  Administered  Date(s) Administered  . Influenza,inj,Quad PF,36+ Mos 01/15/2015  . Pneumococcal Polysaccharide-23 07/29/2011  . Tdap 01/15/2015  No Known Allergies Review of Systems  Constitutional: Negative for fever, malaise/fatigue, fatigue and unexpected weight change.  Eyes: Negative for blurred vision and photophobia.  Respiratory: Negative for chest tightness and shortness of breath.   Cardiovascular: Negative.  Negative for chest pain, palpitations, orthopnea and PND.  Gastrointestinal: Negative.  Negative for  nausea, abdominal pain, diarrhea and constipation.  Endocrine: Negative for cold intolerance, heat intolerance, polydipsia, polyphagia and polyuria.  Genitourinary: Negative for urgency, frequency, decreased urine volume and testicular pain.  Musculoskeletal: Negative.  Negative for neck pain.  Skin: Negative.  Negative for pallor, rash and wound.  Allergic/Immunologic: Negative for immunocompromised state.  Neurological: Positive for numbness (left side). Negative for dizziness, tremors, seizures, facial asymmetry, speech difficulty, weakness and headaches.  Hematological: Negative.   Psychiatric/Behavioral: Negative.  Negative for suicidal ideas, confusion and sleep disturbance. The patient is not nervous/anxious.        Objective:   Physical Exam  Constitutional: He appears well-developed and well-nourished.  HENT:  Head: Normocephalic.  Right Ear: Hearing, tympanic membrane, external ear and ear canal normal.  Left Ear: Hearing, tympanic membrane, external ear and ear canal normal.  Nose: Nose normal.  Mouth/Throat: Uvula is midline, oropharynx is clear and moist and mucous membranes are normal.  Eyes: Conjunctivae, EOM and lids are normal. Right pupil is reactive.  Neck: Trachea normal. No rigidity. Normal range of motion present.  Cardiovascular: Normal rate, regular rhythm, S1 normal, S2 normal, intact distal pulses and normal pulses.   Pulmonary/Chest: Effort normal and breath sounds normal.  Abdominal: Soft. Normal appearance and bowel sounds are normal.  Musculoskeletal: Normal range of motion.  Neurological: He is alert. He has normal reflexes. He is not disoriented. No cranial nerve deficit. He exhibits normal muscle tone.  Monofilament examination within normal limits  Skin: Skin is warm, dry and intact.  Toenails thickened with mild hyperpigmentation  Psychiatric: He has a normal mood and affect. His speech is normal and behavior is normal. Judgment and thought content  normal. Cognition and memory are normal.      BP 135/67 mmHg  Pulse 70  Temp(Src) 98.1 F (36.7 C) (Oral)  Resp 16  Ht 5' 10.5" (1.791 m)  Wt 210 lb (95.255 kg)  BMI 29.70 kg/m2  SpO2 100% Assessment & Plan:   1. Uncontrolled type 2 diabetes mellitus with complication, without long-term current use of insulin ([place])  I have discussed the great importance of following the treatment plan exactly as directed in order to achieve a good medical outcome. - Glucose, capillary - Hemoglobin A1c; Future - COMPLETE METABOLIC PANEL WITH GFR; Future  2. Essential hypertension Blood pressure is at goal on current medication regimen. Will continue as prescribed.  - lisinopril (PRINIVIL,ZESTRIL) 20 MG tablet; Take 1 tablet (20 mg total) by mouth daily.  Dispense: 90 tablet; Refill: 1 - COMPLETE METABOLIC PANEL WITH GFR; Future  3. Neuropathy (HCC) - gabapentin (NEURONTIN) 300 MG capsule; Take 1 capsule (300 mg total) by mouth 3 (three) times daily.  Dispense: 90 capsule; Refill: 2  4. Anemia, unspecified anemia type  - CBC with Differential; Future  5. Tobacco use Smoking cessation instruction/counseling given:  counseled patient on the dangers of tobacco use, advised patient to stop smoking, and reviewed strategies to maximize success    RTC: 1 month for labs and 3 months for diabetes and hypertension    Hollis,Lachina M, FNP  The patient was given clear instructions to go to ER or return to medical center if symptoms do not improve, worsen or new problems develop. The patient verbalized understanding. Will notify patient with laboratory results.

## 2015-08-07 NOTE — Patient Instructions (Signed)
DASH Eating Plan DASH stands for "Dietary Approaches to Stop Hypertension." The DASH eating plan is a healthy eating plan that has been shown to reduce high blood pressure (hypertension). Additional health benefits may include reducing the risk of type 2 diabetes mellitus, heart disease, and stroke. The DASH eating plan may also help with weight loss. WHAT DO I NEED TO KNOW ABOUT THE DASH EATING PLAN? For the DASH eating plan, you will follow these general guidelines:  Choose foods with a percent daily value for sodium of less than 5% (as listed on the food label).  Use salt-free seasonings or herbs instead of table salt or sea salt.  Check with your health care provider or pharmacist before using salt substitutes.  Eat lower-sodium products, often labeled as "lower sodium" or "no salt added."  Eat fresh foods.  Eat more vegetables, fruits, and low-fat dairy products.  Choose whole grains. Look for the word "whole" as the first word in the ingredient list.  Choose fish and skinless chicken or [place] more often than red meat. Limit fish, poultry, and meat to 6 oz (170 g) each day.  Limit sweets, desserts, sugars, and sugary drinks.  Choose heart-healthy fats.  Limit cheese to 1 oz (28 g) per day.  Eat more home-cooked food and less restaurant, buffet, and fast food.  Limit fried foods.  Cook foods using methods other than frying.  Limit canned vegetables. If you do use them, rinse them well to decrease the sodium.  When eating at a restaurant, ask that your food be prepared with less salt, or no salt if possible. WHAT FOODS CAN I EAT? Seek help from a dietitian for individual calorie needs. Grains Whole grain or whole wheat bread. Brown rice. Whole grain or whole wheat pasta. Quinoa, bulgur, and whole grain cereals. Low-sodium cereals. Corn or whole wheat flour tortillas. Whole grain cornbread. Whole grain crackers. Low-sodium crackers. Vegetables Fresh or frozen vegetables  (raw, steamed, roasted, or grilled). Low-sodium or reduced-sodium tomato and vegetable juices. Low-sodium or reduced-sodium tomato sauce and paste. Low-sodium or reduced-sodium canned vegetables.  Fruits All fresh, canned (in natural juice), or frozen fruits. Meat and Other Protein Products Ground beef (85% or leaner), grass-fed beef, or beef trimmed of fat. Skinless chicken or [place]. Ground chicken or [place]. Pork trimmed of fat. All fish and seafood. Eggs. Dried beans, peas, or lentils. Unsalted nuts and seeds. Unsalted canned beans. Dairy Low-fat dairy products, such as skim or 1% milk, 2% or reduced-fat cheeses, low-fat ricotta or cottage cheese, or plain low-fat yogurt. Low-sodium or reduced-sodium cheeses. Fats and Oils Tub margarines without trans fats. Light or reduced-fat mayonnaise and salad dressings (reduced sodium). Avocado. Safflower, olive, or canola oils. Natural peanut or almond butter. Other Unsalted popcorn and pretzels. The items listed above may not be a complete list of recommended foods or beverages. Contact your dietitian for more options. WHAT FOODS ARE NOT RECOMMENDED? Grains White bread. White pasta. White rice. Refined cornbread. Bagels and croissants. Crackers that contain trans fat. Vegetables Creamed or fried vegetables. Vegetables in a cheese sauce. Regular canned vegetables. Regular canned tomato sauce and paste. Regular tomato and vegetable juices. Fruits Dried fruits. Canned fruit in light or heavy syrup. Fruit juice. Meat and Other Protein Products Fatty cuts of meat. Ribs, chicken wings, bacon, sausage, bologna, salami, chitterlings, fatback, hot dogs, bratwurst, and packaged luncheon meats. Salted nuts and seeds. Canned beans with salt. Dairy Whole or 2% milk, cream, half-and-half, and cream cheese. Whole-fat or sweetened yogurt. Full-fat  cheeses or blue cheese. Nondairy creamers and whipped toppings. Processed cheese, cheese spreads, or cheese  curds. Condiments Onion and garlic salt, seasoned salt, table salt, and sea salt. Canned and packaged gravies. Worcestershire sauce. Tartar sauce. Barbecue sauce. Teriyaki sauce. Soy sauce, including reduced sodium. Steak sauce. Fish sauce. Oyster sauce. Cocktail sauce. Horseradish. Ketchup and mustard. Meat flavorings and tenderizers. Bouillon cubes. Hot sauce. Tabasco sauce. Marinades. Taco seasonings. Relishes. Fats and Oils Butter, stick margarine, lard, shortening, ghee, and bacon fat. Coconut, palm kernel, or palm oils. Regular salad dressings. Other Pickles and olives. Salted popcorn and pretzels. The items listed above may not be a complete list of foods and beverages to avoid. Contact your dietitian for more information. WHERE CAN I FIND MORE INFORMATION? National Heart, Lung, and Blood Institute: travelstabloid.com   This information is not intended to replace advice given to you by your health care provider. Make sure you discuss any questions you have with your health care provider.   Document Released: 04/02/2011 Document Revised: 05/04/2014 Document Reviewed: 02/15/2013 Elsevier Interactive Patient Education 2016 [place]. Diabetes and Foot Care Diabetes may cause you to have problems because of poor blood supply (circulation) to your feet and legs. This may cause the skin on your feet to become thinner, break easier, and heal more slowly. Your skin may become dry, and the skin may peel and crack. You may also have nerve damage in your legs and feet causing decreased feeling in them. You may not notice minor injuries to your feet that could lead to infections or more serious problems. Taking care of your feet is one of the most important things you can do for yourself.  HOME CARE INSTRUCTIONS  Wear shoes at all times, even in the house. Do not go barefoot. Bare feet are easily injured.  Check your feet daily for blisters, cuts, and redness. If  you cannot see the bottom of your feet, use a mirror or ask someone for help.  Wash your feet with warm water (do not use hot water) and mild soap. Then pat your feet and the areas between your toes until they are completely dry. Do not soak your feet as this can dry your skin.  Apply a moisturizing lotion or petroleum jelly (that does not contain alcohol and is unscented) to the skin on your feet and to dry, brittle toenails. Do not apply lotion between your toes.  Trim your toenails straight across. Do not dig under them or around the cuticle. File the edges of your nails with an emery board or nail file.  Do not cut corns or calluses or try to remove them with medicine.  Wear clean socks or stockings every day. Make sure they are not too tight. Do not wear knee-high stockings since they may decrease blood flow to your legs.  Wear shoes that fit properly and have enough cushioning. To break in new shoes, wear them for just a few hours a day. This prevents you from injuring your feet. Always look in your shoes before you put them on to be sure there are no objects inside.  Do not cross your legs. This may decrease the blood flow to your feet.  If you find a minor scrape, cut, or break in the skin on your feet, keep it and the skin around it clean and dry. These areas may be cleansed with mild soap and water. Do not cleanse the area with peroxide, alcohol, or iodine.  When you remove an adhesive  bandage, be sure not to damage the skin around it.  If you have a wound, look at it several times a day to make sure it is healing.  Do not use heating pads or hot water bottles. They may burn your skin. If you have lost feeling in your feet or legs, you may not know it is happening until it is too late.  Make sure your health care provider performs a complete foot exam at least annually or more often if you have foot problems. Report any cuts, sores, or bruises to your health care provider  immediately. SEEK MEDICAL CARE IF:   You have an injury that is not healing.  You have cuts or breaks in the skin.  You have an ingrown nail.  You notice redness on your legs or feet.  You feel burning or tingling in your legs or feet.  You have pain or cramps in your legs and feet.  Your legs or feet are numb.  Your feet always feel cold. SEEK IMMEDIATE MEDICAL CARE IF:   There is increasing redness, swelling, or pain in or around a wound.  There is a red line that goes up your leg.  Pus is coming from a wound.  You develop a fever or as directed by your health care provider.  You notice a bad smell coming from an ulcer or wound.   This information is not intended to replace advice given to you by your health care provider. Make sure you discuss any questions you have with your health care provider.   Document Released: 04/10/2000 Document Revised: 12/14/2012 Document Reviewed: 09/20/2012 Elsevier Interactive Patient Education 2016 [place]. Diabetes and Standards of Medical Care Diabetes is complicated. You may find that your diabetes team includes a dietitian, nurse, diabetes educator, eye doctor, and more. To help everyone know what is going on and to help you get the care you deserve, the following schedule of care was developed to help keep you on track. Below are the tests, exams, vaccines, medicines, education, and plans you will need. HbA1c test This test shows how well you have controlled your glucose over the past 2-3 months. It is used to see if your diabetes management plan needs to be adjusted.   It is performed at least 2 times a year if you are meeting treatment goals.  It is performed 4 times a year if therapy has changed or if you are not meeting treatment goals. Blood pressure test  This test is performed at every routine medical visit. The goal is less than 140/90 mm Hg for most people, but 130/80 mm Hg in some cases. Ask your health care provider  about your goal. Dental exam  Follow up with the dentist regularly. Eye exam  If you are diagnosed with type 1 diabetes as a child, get an exam upon reaching the age of 28 years or older and having had diabetes for 3-5 years. Yearly eye exams are recommended after that initial eye exam.  If you are diagnosed with type 1 diabetes as an adult, get an exam within 5 years of diagnosis and then yearly.  If you are diagnosed with type 2 diabetes, get an exam as soon as possible after the diagnosis and then yearly. Foot care exam  Visual foot exams are performed at every routine medical visit. The exams check for cuts, injuries, or other problems with the feet.  You should have a complete foot exam performed every year. This exam includes  an inspection of the structure and skin of your feet, a check of the pulses in your feet, and a check of the sensation in your feet.  Type 1 diabetes: The first exam is performed 5 years after diagnosis.  Type 2 diabetes: The first exam is performed at the time of diagnosis.  Check your feet nightly for cuts, injuries, or other problems with your feet. Tell your health care provider if anything is not healing. Kidney function test (urine microalbumin)  This test is performed once a year.  Type 1 diabetes: The first test is performed 5 years after diagnosis.  Type 2 diabetes: The first test is performed at the time of diagnosis.  A serum creatinine and estimated glomerular filtration rate (eGFR) test is done once a year to assess the level of chronic kidney disease (CKD), if present. Lipid profile (cholesterol, HDL, LDL, triglycerides)  Performed every 5 years for most people.  The goal for LDL is less than 100 mg/dL. If you are at high risk, the goal is less than 70 mg/dL.  The goal for HDL is 40 mg/dL-50 mg/dL for men and 50 mg/dL-60 mg/dL for women. An HDL cholesterol of 60 mg/dL or higher gives some protection against heart disease.  The goal for  triglycerides is less than 150 mg/dL. Immunizations  The flu (influenza) vaccine is recommended yearly for every person 20 months of age or older who has diabetes.  The pneumonia (pneumococcal) vaccine is recommended for every person 24 years of age or older who has diabetes. Adults 41 years of age or older may receive the pneumonia vaccine as a series of two separate shots.  The hepatitis B vaccine is recommended for adults shortly after they have been diagnosed with diabetes.  The Tdap (tetanus, diphtheria, and pertussis) vaccine should be given:  According to normal childhood vaccination schedules, for children.  Every 10 years, for adults who have diabetes. Diabetes self-management education  Education is recommended at diagnosis and ongoing as needed. Treatment plan  Your treatment plan is reviewed at every medical visit.   This information is not intended to replace advice given to you by your health care provider. Make sure you discuss any questions you have with your health care provider.   Document Released: 02/08/2009 Document Revised: 05/04/2014 Document Reviewed: 09/13/2012 Elsevier Interactive Patient Education [employer].

## 2015-08-08 ENCOUNTER — Ambulatory Visit: Payer: Self-pay | Admitting: Family Medicine

## 2015-08-08 ENCOUNTER — Other Ambulatory Visit: Payer: Self-pay

## 2015-08-08 NOTE — Patient Outreach (Signed)
First outreach attempt to patient to obtain mRS. Left message for return call. Will try again next week.    Jacqulynn Cadet  [facility] Care Management Assistant

## 2015-08-14 MED FILL — ?AMLODIPINE BESYLATE 10 MG: 10 | 30 days supply | Qty: 30 | Fill #2

## 2015-08-14 MED FILL — FERROUS SULFATE 325 MG TAB: 325 (65 FE) | 30 days supply | Qty: 30 | Fill #2

## 2015-08-14 MED FILL — CLOPIDOGREL 75 MG TABLET: 75 | 30 days supply | Qty: 30 | Fill #2

## 2015-08-15 ENCOUNTER — Other Ambulatory Visit: Payer: Self-pay

## 2015-08-15 NOTE — Patient Outreach (Signed)
Telephone outreach to patient to obtain mRS was successfully completed. mRS = 3 

## 2015-09-03 ENCOUNTER — Telehealth: Payer: Self-pay | Admitting: Neurology

## 2015-09-03 NOTE — Telephone Encounter (Signed)
I spoke to [place], she is faxing a release today.

## 2015-09-03 NOTE — Telephone Encounter (Signed)
[place] called regarding medical records, "needs to speak to someone today".

## 2015-09-04 NOTE — Telephone Encounter (Signed)
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 Please call [phone].

## 2015-09-06 ENCOUNTER — Other Ambulatory Visit: Payer: Self-pay

## 2015-09-10 MED FILL — $LANTUS 100 UNITS/ML VIAL: 100 | 50 days supply | Qty: 10 | Fill #4

## 2015-09-24 MED FILL — FERROUS SULFATE 325 MG TAB: 325 (65 FE) | 30 days supply | Qty: 30 | Fill #3

## 2015-09-24 MED FILL — GABAPENTIN 300 MG CAPSULE: 300 | 30 days supply | Qty: 90 | Fill #1

## 2015-09-24 MED FILL — LISINOPRIL 20 MG TABLET: 20 | 30 days supply | Qty: 30 | Fill #1

## 2015-10-04 ENCOUNTER — Encounter: Payer: Self-pay | Admitting: Family Medicine

## 2015-10-04 ENCOUNTER — Ambulatory Visit (INDEPENDENT_AMBULATORY_CARE_PROVIDER_SITE_OTHER): Payer: Self-pay | Admitting: Family Medicine

## 2015-10-04 ENCOUNTER — Other Ambulatory Visit: Payer: Self-pay | Admitting: Family Medicine

## 2015-10-04 VITALS — BP 137/68 | HR 81 | Temp 98.6°F | Resp 14 | Ht 70.5 in | Wt 212.0 lb

## 2015-10-04 DIAGNOSIS — I10 Essential (primary) hypertension: Secondary | ICD-10-CM

## 2015-10-04 DIAGNOSIS — G629 Polyneuropathy, unspecified: Secondary | ICD-10-CM

## 2015-10-04 DIAGNOSIS — IMO0002 Reserved for concepts with insufficient information to code with codable children: Secondary | ICD-10-CM

## 2015-10-04 DIAGNOSIS — F17200 Nicotine dependence, unspecified, uncomplicated: Secondary | ICD-10-CM

## 2015-10-04 DIAGNOSIS — E1165 Type 2 diabetes mellitus with hyperglycemia: Secondary | ICD-10-CM

## 2015-10-04 DIAGNOSIS — H269 Unspecified cataract: Secondary | ICD-10-CM

## 2015-10-04 DIAGNOSIS — E785 Hyperlipidemia, unspecified: Secondary | ICD-10-CM

## 2015-10-04 DIAGNOSIS — E118 Type 2 diabetes mellitus with unspecified complications: Secondary | ICD-10-CM

## 2015-10-04 DIAGNOSIS — Z8673 Personal history of transient ischemic attack (TIA), and cerebral infarction without residual deficits: Secondary | ICD-10-CM

## 2015-10-04 LAB — POCT URINALYSIS DIP (DEVICE)
BILIRUBIN URINE: NEGATIVE
GLUCOSE, UA: NEGATIVE mg/dL
Hgb urine dipstick: NEGATIVE
KETONES UR: NEGATIVE mg/dL
Nitrite: NEGATIVE
Protein, ur: NEGATIVE mg/dL
Specific Gravity, Urine: 1.025 (ref 1.005–1.030)
Urobilinogen, UA: 0.2 mg/dL (ref 0.0–1.0)
pH: 5.5 (ref 5.0–8.0)

## 2015-10-04 LAB — COMPLETE METABOLIC PANEL WITH GFR
ALBUMIN: 4.2 g/dL (ref 3.6–5.1)
ALK PHOS: 58 U/L (ref 40–115)
ALT: 13 U/L (ref 9–46)
AST: 12 U/L (ref 10–40)
BUN: 13 mg/dL (ref 7–25)
CALCIUM: 9.3 mg/dL (ref 8.6–10.3)
CO2: 24 mmol/L (ref 20–31)
Chloride: 104 mmol/L (ref 98–110)
Creat: 1 mg/dL (ref 0.60–1.35)
GFR, Est African American: >89 mL/min (ref >=60)
GLUCOSE: 208 mg/dL — AB (ref 65–99)
POTASSIUM: 4.7 mmol/L (ref 3.5–5.3)
SODIUM: 139 mmol/L (ref 135–146)
Total Bilirubin: 0.4 mg/dL (ref 0.2–1.2)
Total Protein: 6.8 g/dL (ref 6.1–8.1)

## 2015-10-04 LAB — GLUCOSE, CAPILLARY: GLUCOSE-CAPILLARY: 219 mg/dL — AB (ref 65–99)

## 2015-10-04 LAB — HEMOGLOBIN A1C
Hgb A1c MFr Bld: 7.9 % — ABNORMAL HIGH (ref <5.7)
Mean Plasma Glucose: 180 mg/dL

## 2015-10-04 MED ORDER — CLOPIDOGREL BISULFATE 75 MG PO TABS: 75.0000 mg | ORAL_TABLET | Freq: Every day | ORAL | Status: DC

## 2015-10-04 MED ORDER — FENOFIBRATE 145 MG PO TABS: 145.0000 mg | ORAL_TABLET | Freq: Every day | ORAL | Status: DC

## 2015-10-04 MED ORDER — GABAPENTIN 400 MG PO CAPS: 400.0000 mg | ORAL_CAPSULE | Freq: Three times a day (TID) | ORAL | Status: DC

## 2015-10-04 MED ORDER — AMLODIPINE BESYLATE 10 MG PO TABS: 10.0000 mg | ORAL_TABLET | Freq: Every day | ORAL | Status: DC

## 2015-10-04 NOTE — Patient Instructions (Signed)
Diabetes and Exercise Exercising regularly is important. It is not just about losing weight. It has many health benefits, such as:  Improving your overall fitness, flexibility, and endurance.  Increasing your bone density.  Helping with weight control.  Decreasing your body fat.  Increasing your muscle strength.  Reducing stress and tension.  Improving your overall health. People with diabetes who exercise gain additional benefits because exercise:  Reduces appetite.  Improves the body's use of blood sugar (glucose).  Helps lower or control blood glucose.  Decreases blood pressure.  Helps control blood lipids (such as cholesterol and triglycerides).  Improves the body's use of the hormone insulin by:  Increasing the body's insulin sensitivity.  Reducing the body's insulin needs.  Decreases the risk for heart disease because exercising:  Lowers cholesterol and triglycerides levels.  Increases the levels of good cholesterol (such as high-density lipoproteins [HDL]) in the body.  Lowers blood glucose levels. YOUR ACTIVITY PLAN  Choose an activity that you enjoy, and set realistic goals. To exercise safely, you should begin practicing any new physical activity slowly, and gradually increase the intensity of the exercise over time. Your health care provider or diabetes educator can help create an activity plan that works for you. General recommendations include:  Encouraging children to engage in at least 60 minutes of physical activity each day.  Stretching and performing strength training exercises, such as yoga or weight lifting, at least 2 times per week.  Performing a total of at least 150 minutes of moderate-intensity exercise each week, such as brisk walking or water aerobics.  Exercising at least 3 days per week, making sure you allow no more than 2 consecutive days to pass without exercising.  Avoiding long periods of inactivity (90 minutes or more). When you  have to spend an extended period of time sitting down, take frequent breaks to walk or stretch. RECOMMENDATIONS FOR EXERCISING WITH TYPE 1 OR TYPE 2 DIABETES   Check your blood glucose before exercising. If blood glucose levels are greater than 240 mg/dL, check for urine ketones. Do not exercise if ketones are present.  Avoid injecting insulin into areas of the body that are going to be exercised. For example, avoid injecting insulin into:  The arms when playing tennis.  The legs when jogging.  Keep a record of:  Food intake before and after you exercise.  Expected peak times of insulin action.  Blood glucose levels before and after you exercise.  The type and amount of exercise you have done.  Review your records with your health care provider. Your health care provider will help you to develop guidelines for adjusting food intake and insulin amounts before and after exercising.  If you take insulin or oral hypoglycemic agents, watch for signs and symptoms of hypoglycemia. They include:  Dizziness.  Shaking.  Sweating.  Chills.  Confusion.  Drink plenty of water while you exercise to prevent dehydration or heat stroke. Body water is lost during exercise and must be replaced.  Talk to your health care provider before starting an exercise program to make sure it is safe for you. Remember, almost any type of activity is better than none.   This information is not intended to replace advice given to you by your health care provider. Make sure you discuss any questions you have with your health care provider.   Document Released: 07/04/2003 Document Revised: 08/28/2014 Document Reviewed: 09/20/2012 Elsevier Interactive Patient Education 2016 Elsevier Inc. Diabetes Mellitus and Food It is important for   you to manage your blood sugar (glucose) level. Your blood glucose level can be greatly affected by what you eat. Eating healthier foods in the appropriate amounts throughout  the day at about the same time each day will help you control your blood glucose level. It can also help slow or prevent worsening of your diabetes mellitus. Healthy eating may even help you improve the level of your blood pressure and reach or maintain a healthy weight.  General recommendations for healthful eating and cooking habits include:  Eating meals and snacks regularly. Avoid going long periods of time without eating to lose weight.  Eating a diet that consists mainly of plant-based foods, such as fruits, vegetables, nuts, legumes, and whole grains.  Using low-heat cooking methods, such as baking, instead of high-heat cooking methods, such as deep frying. Work with your dietitian to make sure you understand how to use the Nutrition Facts information on food labels. HOW CAN FOOD AFFECT ME? Carbohydrates Carbohydrates affect your blood glucose level more than any other type of food. Your dietitian will help you determine how many carbohydrates to eat at each meal and teach you how to count carbohydrates. Counting carbohydrates is important to keep your blood glucose at a healthy level, especially if you are using insulin or taking certain medicines for diabetes mellitus. Alcohol Alcohol can cause sudden decreases in blood glucose (hypoglycemia), especially if you use insulin or take certain medicines for diabetes mellitus. Hypoglycemia can be a life-threatening condition. Symptoms of hypoglycemia (sleepiness, dizziness, and disorientation) are similar to symptoms of having too much alcohol.  If your health care provider has given you approval to drink alcohol, do so in moderation and use the following guidelines:  Women should not have more than one drink per day, and men should not have more than two drinks per day. One drink is equal to:  12 oz of beer.  5 oz of wine.  1 oz of hard liquor.  Do not drink on an empty stomach.  Keep yourself hydrated. Have water, diet soda, or  unsweetened iced tea.  Regular soda, juice, and other mixers might contain a lot of carbohydrates and should be counted. WHAT FOODS ARE NOT RECOMMENDED? As you make food choices, it is important to remember that all foods are not the same. Some foods have fewer nutrients per serving than other foods, even though they might have the same number of calories or carbohydrates. It is difficult to get your body what it needs when you eat foods with fewer nutrients. Examples of foods that you should avoid that are high in calories and carbohydrates but low in nutrients include:  Trans fats (most processed foods list trans fats on the Nutrition Facts label).  Regular soda.  Juice.  Candy.  Sweets, such as cake, pie, doughnuts, and cookies.  Fried foods. WHAT FOODS CAN I EAT? Eat nutrient-rich foods, which will nourish your body and keep you healthy. The food you should eat also will depend on several factors, including:  The calories you need.  The medicines you take.  Your weight.  Your blood glucose level.  Your blood pressure level.  Your cholesterol level. You should eat a variety of foods, including:  Protein.  Lean cuts of meat.  Proteins low in saturated fats, such as fish, egg whites, and beans. Avoid processed meats.  Fruits and vegetables.  Fruits and vegetables that may help control blood glucose levels, such as apples, mangoes, and yams.  Dairy products.  Choose fat-free   or low-fat dairy products, such as milk, yogurt, and cheese.  Grains, bread, pasta, and rice.  Choose whole grain products, such as multigrain bread, whole oats, and brown rice. These foods may help control blood pressure.  Fats.  Foods containing healthful fats, such as nuts, avocado, olive oil, canola oil, and fish. DOES EVERYONE WITH DIABETES MELLITUS HAVE THE SAME MEAL PLAN? Because every person with diabetes mellitus is different, there is not one meal plan that works for everyone. It  is very important that you meet with a dietitian who will help you create a meal plan that is just right for you.   This information is not intended to replace advice given to you by your health care provider. Make sure you discuss any questions you have with your health care provider.   Document Released: 01/08/2005 Document Revised: 05/04/2014 Document Reviewed: 03/10/2013 Elsevier Interactive Patient Education 2016 Elsevier Inc.  

## 2015-10-04 NOTE — Progress Notes (Signed)
Subjective:    Patient ID: Frank Schneider, male    DOB: 03-05-1976, 40 y.o.   MRN: [id number]  Hypertension This is a chronic problem. The current episode started more than 1 month ago. The problem is unchanged. The problem is controlled. Associated symptoms include headaches. Pertinent negatives include no anxiety, blurred vision, chest pain, malaise/fatigue, neck pain, orthopnea, palpitations, peripheral edema, PND, shortness of breath or sweats. Risk factors for coronary artery disease include diabetes mellitus, male gender and smoking/tobacco exposure. Past treatments include ACE inhibitors and calcium channel blockers. The current treatment provides significant improvement. There are no compliance problems.   Diabetes He presents for his follow-up diabetic visit. He has type 2 diabetes mellitus. His disease course has been stable. Hypoglycemia symptoms include headaches. Pertinent negatives for hypoglycemia include no confusion, dizziness, nervousness/anxiousness, pallor, seizures, sleepiness, speech difficulty, sweats or tremors. Associated symptoms include foot paresthesias. Pertinent negatives for diabetes include no blurred vision, no chest pain, no fatigue, no foot ulcerations, no polydipsia, no polyphagia, no polyuria, no visual change and no weakness. Pertinent negatives for hypoglycemia complications include no blackouts, no nocturnal hypoglycemia and no required glucagon injection. Symptoms are stable. Risk factors for coronary artery disease include hypertension and male sex. Current diabetic treatment includes insulin injections. He is compliant with treatment all of the time. He is currently taking insulin at bedtime. Insulin injections are given by patient. Rotation sites for injection include the abdominal wall. His weight is stable. He is following a diabetic diet. He has not had a previous visit with a dietitian. He rarely participates in exercise. His breakfast blood glucose is taken  between 9-10 am. His breakfast blood glucose range is generally 130-140 mg/dl. An ACE inhibitor/angiotensin II receptor blocker is being taken. He does not see a podiatrist.Eye exam is not current.    Past Medical History  Diagnosis Date  . Hypertension   . Priapism   . Tobacco use   . Diabetes mellitus     Takes Metformin  . Stroke ([place])   . Vision abnormalities   . Blind right eye      Medication List       This list is accurate as of: 10/04/15  2:03 PM.  Always use your most recent med list.               amLODipine 10 MG tablet  Commonly known as:  NORVASC  Take 1 tablet (10 mg total) by mouth daily.     clopidogrel 75 MG tablet  Commonly known as:  PLAVIX  Take 1 tablet (75 mg total) by mouth daily.     ferrous sulfate 325 (65 FE) MG tablet  Take 1 tablet (325 mg total) by mouth daily with breakfast.     freestyle lancets  Use as instructed     gabapentin 300 MG capsule  Commonly known as:  NEURONTIN  Take 1 capsule (300 mg total) by mouth 3 (three) times daily.     glucose blood test strip  Commonly known as:  TRUE METRIX BLOOD GLUCOSE TEST  Use as instructed     insulin glargine 100 UNIT/ML injection  Commonly known as:  LANTUS  Inject 0.2 mLs (20 Units total) into the skin daily.     INSULIN SYRINGE .5CC/30GX1/2" 30G X 1/2" 0.5 ML Misc  100     lisinopril 20 MG tablet  Commonly known as:  PRINIVIL,ZESTRIL  Take 1 tablet (20 mg total) by mouth daily.     TRUE METRIX AIR  GLUCOSE METER Devi  1 each by Does not apply route 2 (two) times daily at 10 AM and 5 PM.       Immunization History  Administered Date(s) Administered  . Influenza,inj,Quad PF,36+ Mos 01/15/2015  . Pneumococcal Polysaccharide-23 07/29/2011  . Tdap 01/15/2015  No Known Allergies Review of Systems  Constitutional: Negative for fever, malaise/fatigue, fatigue and unexpected weight change.  Eyes: Negative for blurred vision and photophobia.  Respiratory: Negative for chest  tightness and shortness of breath.   Cardiovascular: Negative.  Negative for chest pain, palpitations, orthopnea and PND.  Gastrointestinal: Negative.  Negative for nausea, abdominal pain, diarrhea and constipation.  Endocrine: Negative for cold intolerance, heat intolerance, polydipsia, polyphagia and polyuria.  Genitourinary: Negative for urgency, frequency, decreased urine volume and testicular pain.  Musculoskeletal: Negative.  Negative for neck pain.  Skin: Negative.  Negative for pallor, rash and wound.  Allergic/Immunologic: Negative for immunocompromised state.  Neurological: Positive for numbness (left side) and headaches. Negative for dizziness, tremors, seizures, facial asymmetry, speech difficulty and weakness.  Hematological: Negative.   Psychiatric/Behavioral: Negative.  Negative for suicidal ideas, confusion and sleep disturbance. The patient is not nervous/anxious.        Objective:   Physical Exam  Constitutional: He is oriented to person, place, and time. He appears well-developed and well-nourished.  HENT:  Head: Normocephalic.  Right Ear: Hearing, tympanic membrane, external ear and ear canal normal.  Left Ear: Hearing, tympanic membrane, external ear and ear canal normal.  Nose: Nose normal.  Mouth/Throat: Uvula is midline, oropharynx is clear and moist and mucous membranes are normal.  Eyes: Conjunctivae, EOM and lids are normal. Right pupil is reactive.  Fundoscopic exam:      The right eye shows no red reflex.       The left eye shows red reflex.    Neck: Trachea normal and normal range of motion. Neck supple. No rigidity. Normal range of motion present.  Cardiovascular: Normal rate, regular rhythm, S1 normal, S2 normal, normal heart sounds, intact distal pulses and normal pulses.   Pulmonary/Chest: Effort normal and breath sounds normal.  Abdominal: Soft. Normal appearance and bowel sounds are normal.  Musculoskeletal: Normal range of motion.  Neurological:  He is alert and oriented to person, place, and time. He has normal reflexes. He is not disoriented. No cranial nerve deficit. He exhibits normal muscle tone.  Monofilament examination within normal limits  Skin: Skin is warm, dry and intact.  Toenails thickened with mild hyperpigmentation  Psychiatric: He has a normal mood and affect. His speech is normal and behavior is normal. Judgment and thought content normal. Cognition and memory are normal.      BP 137/68 mmHg  Pulse 81  Temp(Src) 98.6 F (37 C) (Oral)  Resp 14  Ht 5' 10.5" (1.791 m)  Wt 212 lb (96.163 kg)  BMI 29.98 kg/m2  SpO2 98% Assessment & Plan:  1. Uncontrolled type 2 diabetes mellitus with complication, without long-term current use of insulin ([place]) Will continue medication at current dosage. The patient is asked to make an attempt to improve diet and exercise patterns to aid in medical management of this problem. - Urinalysis Dipstick - Hemoglobin A1c - COMPLETE METABOLIC PANEL WITH GFR  2. Essential hypertension Blood pressure is at goal on current medication regimen.  - amLODipine (NORVASC) 10 MG tablet; Take 1 tablet (10 mg total) by mouth daily.  Dispense: 30 tablet; Refill: 2 - Urinalysis Dipstick - COMPLETE METABOLIC PANEL WITH GFR  3. Neuropathy (  [place]) Will increase gabapentin to 400 mg BID.  - gabapentin (NEURONTIN) 400 MG capsule; Take 1 capsule (400 mg total) by mouth 3 (three) times daily.  Dispense: 90 capsule; Refill: 1  4. Right cataract - Ambulatory referral to Ophthalmology  5. History of CVA (cerebrovascular accident) - clopidogrel (PLAVIX) 75 MG tablet; Take 1 tablet (75 mg total) by mouth daily.  Dispense: 30 tablet; Refill: 2  6. Tobacco use Smoking cessation instruction/counseling given:  counseled patient on the dangers of tobacco use, advised patient to stop smoking, and reviewed strategies to maximize success    RTC: 3 months for diabetes and hypertension     Tania Perrott M, FNP      The patient was given clear instructions to go to ER or return to medical center if symptoms do not improve, worsen or new problems develop. The patient verbalized understanding. Will notify patient with laboratory results.

## 2015-10-14 ENCOUNTER — Ambulatory Visit: Payer: Medicaid Other | Attending: Internal Medicine

## 2015-10-24 MED FILL — AMLODIPINE BESYLATE 10 MG T: 10 | 30 days supply | Qty: 30 | Fill #0

## 2015-10-24 MED FILL — ?LISINOPRIL 20 MG TABLET: 20 | 30 days supply | Qty: 30 | Fill #2

## 2015-10-24 MED FILL — GABAPENTIN 400 MG CAPSULE: 400 | 30 days supply | Qty: 90 | Fill #0

## 2015-10-24 MED FILL — FENOFIBRATE 145 MG TABLET: 145 | 30 days supply | Qty: 30 | Fill #0

## 2015-10-24 MED FILL — CLOPIDOGREL 75 MG TABLET: 75 | 30 days supply | Qty: 30 | Fill #0

## 2015-11-07 ENCOUNTER — Ambulatory Visit: Payer: Self-pay | Admitting: Family Medicine

## 2015-11-25 ENCOUNTER — Telehealth: Payer: Self-pay

## 2015-11-25 ENCOUNTER — Other Ambulatory Visit: Payer: Self-pay

## 2015-11-25 ENCOUNTER — Other Ambulatory Visit: Payer: Self-pay | Admitting: Family Medicine

## 2015-11-25 DIAGNOSIS — D509 Iron deficiency anemia, unspecified: Secondary | ICD-10-CM

## 2015-11-25 MED ORDER — "INSULIN SYRINGE 30G X 1/2"" 0.5 ML MISC": 0 refills | Status: DC

## 2015-11-25 MED FILL — $LANTUS 100 UNITS/ML VIAL: 100 | 50 days supply | Qty: 10 | Fill #5

## 2015-11-25 MED FILL — TRUEPLUS SYR 1ML 31GX5/16: 31G X 5/16" | 25 days supply | Qty: 100 | Fill #0

## 2015-11-25 MED FILL — AMLODIPINE BESYLATE 10 MG T: 10 | 30 days supply | Qty: 30 | Fill #1

## 2015-11-25 MED FILL — ?LISINOPRIL 20 MG TABLET: 20 | 30 days supply | Qty: 30 | Fill #3

## 2015-11-25 MED FILL — GABAPENTIN 300 MG CAPSULE: 300 | 30 days supply | Qty: 90 | Fill #2

## 2015-11-25 MED FILL — CLOPIDOGREL 75 MG TABLET: 75 | 30 days supply | Qty: 30 | Fill #1

## 2015-11-25 MED FILL — FENOFIBRATE 145 MG TABLET: 145 | 30 days supply | Qty: 30 | Fill #1

## 2015-11-25 MED FILL — FERROUS SULFATE 325 MG TAB: 325 (65 FE) | 30 days supply | Qty: 30 | Fill #0

## 2015-11-25 NOTE — Telephone Encounter (Signed)
Refill for insulin syringes sent into pharmacy. Thanks!  

## 2015-11-25 NOTE — Telephone Encounter (Signed)
Spoke with patient and refill on syringes were sent into pharmacy. Thanks!

## 2015-11-26 DIAGNOSIS — Z8719 Personal history of other diseases of the digestive system: Secondary | ICD-10-CM

## 2015-11-26 HISTORY — DX: Personal history of other diseases of the digestive system: Z87.19

## 2015-12-15 ENCOUNTER — Emergency Department (HOSPITAL_COMMUNITY): Payer: Medicaid Other

## 2015-12-15 ENCOUNTER — Emergency Department (HOSPITAL_COMMUNITY)
Admission: EM | Admit: 2015-12-15 | Discharge: 2015-12-16 | Disposition: A | Discharge status: Home / Self Care | Payer: Medicaid Other | Source: Home / Self Care | Attending: Emergency Medicine | Admitting: Emergency Medicine

## 2015-12-15 DIAGNOSIS — Z794 Long term (current) use of insulin: Secondary | ICD-10-CM | POA: Insufficient documentation

## 2015-12-15 DIAGNOSIS — E1159 Type 2 diabetes mellitus with other circulatory complications: Secondary | ICD-10-CM | POA: Insufficient documentation

## 2015-12-15 DIAGNOSIS — I10 Essential (primary) hypertension: Secondary | ICD-10-CM

## 2015-12-15 DIAGNOSIS — F17210 Nicotine dependence, cigarettes, uncomplicated: Secondary | ICD-10-CM

## 2015-12-15 DIAGNOSIS — R1084 Generalized abdominal pain: Secondary | ICD-10-CM

## 2015-12-15 DIAGNOSIS — Z8673 Personal history of transient ischemic attack (TIA), and cerebral infarction without residual deficits: Secondary | ICD-10-CM | POA: Insufficient documentation

## 2015-12-15 DIAGNOSIS — E1140 Type 2 diabetes mellitus with diabetic neuropathy, unspecified: Secondary | ICD-10-CM

## 2015-12-15 DIAGNOSIS — K5909 Other constipation: Secondary | ICD-10-CM

## 2015-12-15 LAB — LIPASE, BLOOD: Lipase: 65 U/L — ABNORMAL HIGH (ref 11–51)

## 2015-12-15 LAB — COMPREHENSIVE METABOLIC PANEL
ALBUMIN: 4.3 g/dL (ref 3.5–5.0)
ALK PHOS: 47 U/L (ref 38–126)
ALT: 15 U/L — AB (ref 17–63)
AST: 17 U/L (ref 15–41)
Anion gap: 9 (ref 5–15)
BUN: 12 mg/dL (ref 6–20)
CALCIUM: 9.3 mg/dL (ref 8.9–10.3)
CO2: 26 mmol/L (ref 22–32)
Chloride: 103 mmol/L (ref 101–111)
Creatinine, Ser: 1.16 mg/dL (ref 0.61–1.24)
GFR calc Af Amer: >60 mL/min (ref >60)
GFR calc non Af Amer: >60 mL/min (ref >60)
GLUCOSE: 203 mg/dL — AB (ref 65–99)
Potassium: 4.3 mmol/L (ref 3.5–5.1)
SODIUM: 138 mmol/L (ref 135–145)
TOTAL PROTEIN: 7.1 g/dL (ref 6.5–8.1)
Total Bilirubin: 0.7 mg/dL (ref 0.3–1.2)

## 2015-12-15 MED ORDER — SODIUM CHLORIDE 0.9 % IV BOLUS (SEPSIS)
1000.0000 mL | Freq: Once | INTRAVENOUS | Status: AC
  Administered 2015-12-15: 1000 mL via INTRAVENOUS

## 2015-12-15 MED ORDER — ONDANSETRON HCL 4 MG/2ML IJ SOLN
4.0000 mg | Freq: Once | INTRAMUSCULAR | Status: AC
  Administered 2015-12-16: 4 mg via INTRAVENOUS
  Filled 2015-12-15: qty 2

## 2015-12-15 MED ORDER — GI COCKTAIL ~~LOC~~
30.0000 mL | Freq: Once | ORAL | Status: AC
Start: 2015-12-15 — End: 2015-12-15
  Administered 2015-12-15: 30 mL via ORAL
  Filled 2015-12-15: qty 30

## 2015-12-15 MED ORDER — MORPHINE SULFATE (PF) 4 MG/ML IV SOLN
4.0000 mg | Freq: Once | INTRAVENOUS | Status: AC
Start: 2015-12-15 — End: 2015-12-16
  Administered 2015-12-16: 4 mg via INTRAVENOUS
  Filled 2015-12-15: qty 1

## 2015-12-15 MED ORDER — ONDANSETRON HCL 4 MG/2ML IJ SOLN
4.0000 mg | Freq: Once | INTRAMUSCULAR | Status: AC
  Administered 2015-12-15: 4 mg via INTRAVENOUS
  Filled 2015-12-15: qty 2

## 2015-12-15 NOTE — ED Provider Notes (Signed)
[facility] DEPT Provider Note   CSN: [id number] Arrival date & time: 12/15/15  2148  By signing my name below, I, Hansel Feinstein, attest that this documentation has been prepared under the direction and in the presence of Merryl Hacker, MD. Electronically Signed: Hansel Feinstein, ED Scribe. 12/15/15. 11:24 PM.    History   Chief Complaint Chief Complaint  Patient presents with  . Abdominal Pain  . Emesis    HPI Frank Schneider is a 40 y.o. male with h/o DM who presents to the Emergency Department complaining of moderate, cramping, 10/10 lower and periumbilical abdominal pain onset 3 hours ago. Pt reports associated nausea and NBNB emesis. Pt states his pain is not worsened post prandial. No h/o of similar symptoms. Last normal bowel movement was this morning at 11:45 am. He denies recent alcohol or drug use. Pt reports his CBGs have been stable recently in the 140s at home. Pt denies diarrhea, CP, SOB, fever, dysuria, difficulty urinating, hematuria, frequency, urgency.   The history is provided by the patient. No language interpreter was used.    Past Medical History:  Diagnosis Date  . Blind right eye   . Diabetes mellitus    Takes Metformin  . Hypertension   . Priapism   . Stroke ([place])   . Tobacco use   . Vision abnormalities     Patient Active Problem List   Diagnosis Date Noted  . Right cataract 10/04/2015  . Tobacco dependence 10/04/2015  . Absolute anemia 08/07/2015  . Neuropathy ([place]) 08/07/2015  . Cerebrovascular accident (CVA) ([place])   . Leukocytosis   . Stroke (cerebrum) ([place]) 05/09/2015  . Type 2 diabetes mellitus with circulatory disorder ([place]) 03/12/2015  . Hyperlipidemia 02/14/2015  . History of CVA (cerebrovascular accident) 02/14/2015  . CVA (cerebral vascular accident) ([place]) 01/01/2015  . Diabetes mellitus type 2 with complications, uncontrolled ([place]) 01/01/2015  . Stroke ([place])   . Essential hypertension   . Smoker   . Tobacco use     Past  Surgical History:  Procedure Laterality Date  . INCISION AND DRAINAGE PERIRECTAL ABSCESS  07/28/2011   Procedure: IRRIGATION AND DEBRIDEMENT PERIRECTAL ABSCESS;  Surgeon: Adin Hector, MD;  Location: [facility] ORS;  Service: General;  Laterality: N/A;   of skin muscle and subcutaneous tissue of perimeum  8cmx12cm area   . PENECTOMY         Home Medications    Prior to Admission medications   Medication Sig Start Date End Date Taking? Authorizing Provider  amLODipine (NORVASC) 10 MG tablet Take 1 tablet (10 mg total) by mouth daily. 10/04/15   Dorena Dew, FNP  Blood Glucose Monitoring Suppl (TRUE METRIX AIR GLUCOSE METER) DEVI 1 each by Does not apply route 2 (two) times daily at 10 AM and 5 PM. 01/15/15   Dorena Dew, FNP  clopidogrel (PLAVIX) 75 MG tablet Take 1 tablet (75 mg total) by mouth daily. 10/04/15   Dorena Dew, FNP  fenofibrate (TRICOR) 145 MG tablet Take 1 tablet (145 mg total) by mouth daily. 10/04/15   Dorena Dew, FNP  ferrous sulfate 325 (65 FE) MG tablet TAKE 1 TABLET BY MOUTH DAILY WITH BREAKFAST 11/25/15   Micheline Chapman, NP  gabapentin (NEURONTIN) 400 MG capsule Take 1 capsule (400 mg total) by mouth 3 (three) times daily. 10/04/15   Dorena Dew, FNP  glucose blood (TRUE METRIX BLOOD GLUCOSE TEST) test strip Use as instructed 01/15/15   Dorena Dew, FNP  insulin glargine (LANTUS) 100 UNIT/ML injection Inject 0.2 mLs (20 Units total) into the skin daily. 06/12/15   Tresa Garter, MD  Insulin Syringe-Needle U-100 (INSULIN SYRINGE .5CC/30GX1/2") 30G X 1/2" 0.5 ML MISC 100 11/25/15   Micheline Chapman, NP  Lancets (FREESTYLE) lancets Use as instructed 01/03/15   Reyne Dumas, MD  lisinopril (PRINIVIL,ZESTRIL) 20 MG tablet Take 1 tablet (20 mg total) by mouth daily. 08/07/15   Dorena Dew, FNP    Family History Family History  Problem Relation Age of Onset  . Diabetes Mother   . Hypertension Mother   . Diabetes Maternal Grandmother   .  Hypertension Maternal Grandmother   . Depression Maternal Grandmother     Social History Social History  Substance Use Topics  . Smoking status: Current Some Day Smoker    Packs/day: 0.50    Years: 8.00    Types: Cigarettes  . Smokeless tobacco: Never Used  . Alcohol use No     Allergies   Review of patient's allergies indicates no known allergies.   Review of Systems Review of Systems  Constitutional: Negative for fever.  Respiratory: Negative for shortness of breath.   Cardiovascular: Negative for chest pain.  Gastrointestinal: Positive for abdominal pain, nausea and vomiting. Negative for diarrhea.  Genitourinary: Negative for difficulty urinating, dysuria, frequency, hematuria and urgency.  All other systems reviewed and are negative.    Physical Exam Updated Vital Signs BP 153/82   Pulse 81   Resp 18   SpO2 96%   Physical Exam  Constitutional: He is oriented to person, place, and time. He appears well-developed and well-nourished. No distress.  HENT:  Head: Normocephalic and atraumatic.  Cardiovascular: Normal rate, regular rhythm and normal heart sounds.   No murmur heard. Pulmonary/Chest: Effort normal and breath sounds normal. No respiratory distress. He has no wheezes.  Abdominal: Soft. Bowel sounds are normal. There is tenderness. There is no rebound and no guarding.  Right upper, right lower, and mid tenderness to palpation without rebound or guarding  Musculoskeletal: He exhibits no edema.  Neurological: He is alert and oriented to person, place, and time.  Skin: Skin is warm and dry.  Psychiatric: He has a normal mood and affect.  Nursing note and vitals reviewed.    ED Treatments / Results  Labs (all labs ordered are listed, but only abnormal results are displayed) Labs Reviewed  CBC WITH DIFFERENTIAL/PLATELET - Abnormal; Notable for the following:       Result Value   WBC 14.2 (*)    RBC 6.14 (*)    Hemoglobin 11.7 (*)    HCT 37.3 (*)     MCV 60.7 (*)    MCH 19.1 (*)    Neutro Abs 11.5 (*)    All other components within normal limits  COMPREHENSIVE METABOLIC PANEL - Abnormal; Notable for the following:    Glucose, Bld 203 (*)    ALT 15 (*)    All other components within normal limits  LIPASE, BLOOD - Abnormal; Notable for the following:    Lipase 65 (*)    All other components within normal limits  URINALYSIS, ROUTINE W REFLEX MICROSCOPIC (NOT AT [facility]) - Abnormal; Notable for the following:    Glucose, UA 100 (*)    Ketones, ur 15 (*)    All other components within normal limits    EKG  EKG Interpretation  Date/Time:  Sunday December 15 2015 23:03:41 EDT Ventricular Rate:  72 PR Interval:  QRS Duration: 85 QT Interval:  371 QTC Calculation: 406 R Axis:   17 Text Interpretation:  Sinus rhythm RSR' in V1 or V2, right VCD or RVH Borderline ST elevation, anterolateral leads No significant change since last tracing Confirmed by Malie Kashani  MD, Breland Elders ([id number]) on 12/15/2015 11:07:40 PM       Radiology Dg Abdomen 1 View  Result Date: 12/15/2015 CLINICAL DATA:  Right-sided abdominal pain.  Nausea and vomiting. EXAM: ABDOMEN - 1 VIEW COMPARISON:  CT pelvis 07/28/2011 FINDINGS: The bowel gas pattern is normal. Small volume of colonic stool in the right colon. No radio-opaque calculi. Atherosclerosis is noted. Degenerative change of both hips. IMPRESSION: Normal bowel gas pattern. Electronically Signed   By: Jeb Levering M.D.   On: 12/15/2015 23:59    Procedures Procedures (including critical care time)  DIAGNOSTIC STUDIES: Oxygen Saturation is 100% on RA, normal by my interpretation.    COORDINATION OF CARE: 11:12 PM Discussed treatment plan with pt at bedside which includes lab work and pt agreed to plan.    Medications Ordered in ED Medications  magnesium citrate solution 0.5 Bottle (not administered)  sodium chloride 0.9 % bolus 1,000 mL (0 mLs Intravenous Stopped 12/16/15 0052)  ondansetron (ZOFRAN)  injection 4 mg (4 mg Intravenous Given 12/15/15 2252)  gi cocktail (Maalox,Lidocaine,Donnatal) (30 mLs Oral Given 12/15/15 2252)  morphine 4 MG/ML injection 4 mg (4 mg Intravenous Given 12/16/15 0102)  ondansetron (ZOFRAN) injection 4 mg (4 mg Intravenous Given 12/16/15 0102)  sodium phosphate (FLEET) 7-19 GM/118ML enema 1 enema (1 enema Rectal Given 12/16/15 0226)     Initial Impression / Assessment and Plan / ED Course  I have reviewed the triage vital signs and the nursing notes.  Pertinent labs & imaging results that were available during my care of the patient were reviewed by me and considered in my medical decision making (see chart for details).  Clinical Course  Comment By Time  Patient reports minimal improvement of pain. On repeat exam, he continues to have tenderness diffusely but mostly over the right side. His history is very atypical for appendicitis. Discussed with the patient the option of CT scan. Patient states that he feels like he would feel better if he was able to have a bowel movement. Enema ordered. Will recheck. Merryl Hacker, MD 08/21 [phone]    Patient presents with abdominal pain. Nontoxic on exam. Vital signs reassuring. Tenderness to palpation mid and right upper and lower abdomens. No signs of peritonitis. Gastritis, obstruction, less likely appendicitis given onset of symptoms approximately 3 hours ago. Basic lab work notable for mild leukocytosis.  No left shift. Otherwise lab work is unremarkable. On repeat evaluation, patient reports continued pain. He states he feels like he needs to have a bowel movement. Continues to have diffuse tenderness mostly over the mid and right abdomen.  Discussed with patient the possibility of CT scan to rule out atypical appendicitis. Patient would like to defer. He is requesting an enema. This was provided and the patient did not have any results. Abdominal series without significant stool burden and without evidence of obstruction.  He continues to request laxative. He was given mag citrate. He at this time would like to defer CT scan. Will discharge home with mag citrate. He was given strict return precautions.  After history, exam, and medical workup I feel the patient has been appropriately medically screened and is safe for discharge home. Pertinent diagnoses were discussed with the patient. Patient was given return  precautions.   Final Clinical Impressions(s) / ED Diagnoses   Final diagnoses:  Generalized abdominal pain  Other constipation    New Prescriptions New Prescriptions   No medications on file    I personally performed the services described in this documentation, which was scribed in my presence. The recorded information has been reviewed and is accurate.    Merryl Hacker, MD 12/16/15 [phone]

## 2015-12-15 NOTE — ED Triage Notes (Signed)
Pt reports to the ED for eval of generalized abd pain and N/V x 2 hours. Abdomen tender to palpation. Abd pain occurred after the N/V developed. Denies any hematemesis. No active vomiting noted en route. Pt A&Ox4, resp e/u, and skin warm and dry. Denies any CP, SOB, or diaphoresis.  VSS- 124/80, HR-60, SpO2- 98% on RA, CBG 172 md/dl

## 2015-12-15 NOTE — ED Notes (Signed)
Pt is aware he needs a urine sample but can not go at this time.

## 2015-12-16 ENCOUNTER — Encounter (HOSPITAL_COMMUNITY): Admission: EM | Disposition: A | Discharge status: Home / Self Care | Payer: Self-pay | Source: Home / Self Care

## 2015-12-16 ENCOUNTER — Inpatient Hospital Stay (HOSPITAL_COMMUNITY): Payer: Medicaid Other | Admitting: Certified Registered"

## 2015-12-16 ENCOUNTER — Encounter (HOSPITAL_COMMUNITY): Payer: Self-pay | Admitting: Emergency Medicine

## 2015-12-16 ENCOUNTER — Emergency Department (HOSPITAL_COMMUNITY): Payer: Medicaid Other

## 2015-12-16 ENCOUNTER — Inpatient Hospital Stay (HOSPITAL_COMMUNITY)
Admission: EM | Admit: 2015-12-16 | Discharge: 2015-12-23 | DRG: 338 | Disposition: A | Discharge status: Home / Self Care | Payer: Medicaid Other | Attending: Surgery | Admitting: Surgery

## 2015-12-16 DIAGNOSIS — F17210 Nicotine dependence, cigarettes, uncomplicated: Secondary | ICD-10-CM | POA: Diagnosis present

## 2015-12-16 DIAGNOSIS — Z7902 Long term (current) use of antithrombotics/antiplatelets: Secondary | ICD-10-CM

## 2015-12-16 DIAGNOSIS — Z794 Long term (current) use of insulin: Secondary | ICD-10-CM | POA: Diagnosis not present

## 2015-12-16 DIAGNOSIS — Z8249 Family history of ischemic heart disease and other diseases of the circulatory system: Secondary | ICD-10-CM

## 2015-12-16 DIAGNOSIS — K651 Peritoneal abscess: Secondary | ICD-10-CM | POA: Diagnosis not present

## 2015-12-16 DIAGNOSIS — E871 Hypo-osmolality and hyponatremia: Secondary | ICD-10-CM | POA: Diagnosis not present

## 2015-12-16 DIAGNOSIS — Z833 Family history of diabetes mellitus: Secondary | ICD-10-CM

## 2015-12-16 DIAGNOSIS — I10 Essential (primary) hypertension: Secondary | ICD-10-CM | POA: Diagnosis present

## 2015-12-16 DIAGNOSIS — K352 Acute appendicitis with generalized peritonitis, without abscess: Secondary | ICD-10-CM

## 2015-12-16 DIAGNOSIS — Z8673 Personal history of transient ischemic attack (TIA), and cerebral infarction without residual deficits: Secondary | ICD-10-CM

## 2015-12-16 DIAGNOSIS — T814XXA Infection following a procedure, initial encounter: Secondary | ICD-10-CM | POA: Diagnosis not present

## 2015-12-16 DIAGNOSIS — E119 Type 2 diabetes mellitus without complications: Secondary | ICD-10-CM | POA: Diagnosis present

## 2015-12-16 DIAGNOSIS — R000 Tachycardia, unspecified: Secondary | ICD-10-CM | POA: Diagnosis present

## 2015-12-16 DIAGNOSIS — K5900 Constipation, unspecified: Secondary | ICD-10-CM | POA: Diagnosis present

## 2015-12-16 DIAGNOSIS — Y838 Other surgical procedures as the cause of abnormal reaction of the patient, or of later complication, without mention of misadventure at the time of the procedure: Secondary | ICD-10-CM | POA: Diagnosis not present

## 2015-12-16 DIAGNOSIS — K560 Paralytic ileus: Secondary | ICD-10-CM | POA: Diagnosis not present

## 2015-12-16 DIAGNOSIS — R1031 Right lower quadrant pain: Secondary | ICD-10-CM | POA: Diagnosis present

## 2015-12-16 DIAGNOSIS — H5441 Blindness, right eye, normal vision left eye: Secondary | ICD-10-CM | POA: Diagnosis present

## 2015-12-16 DIAGNOSIS — G8194 Hemiplegia, unspecified affecting left nondominant side: Secondary | ICD-10-CM | POA: Diagnosis present

## 2015-12-16 DIAGNOSIS — Z79899 Other long term (current) drug therapy: Secondary | ICD-10-CM | POA: Diagnosis not present

## 2015-12-16 DIAGNOSIS — R509 Fever, unspecified: Secondary | ICD-10-CM

## 2015-12-16 DIAGNOSIS — R188 Other ascites: Secondary | ICD-10-CM

## 2015-12-16 DIAGNOSIS — K3580 Unspecified acute appendicitis: Secondary | ICD-10-CM | POA: Diagnosis present

## 2015-12-16 HISTORY — PX: LAPAROSCOPIC APPENDECTOMY: SHX408

## 2015-12-16 LAB — URINALYSIS, ROUTINE W REFLEX MICROSCOPIC
BILIRUBIN URINE: NEGATIVE
Bilirubin Urine: NEGATIVE
GLUCOSE, UA: 500 mg/dL — AB
Glucose, UA: 100 mg/dL — AB
HGB URINE DIPSTICK: NEGATIVE
Hgb urine dipstick: NEGATIVE
Ketones, ur: 15 mg/dL — AB
Ketones, ur: 15 mg/dL — AB
LEUKOCYTES UA: NEGATIVE
Leukocytes, UA: NEGATIVE
NITRITE: NEGATIVE
NITRITE: NEGATIVE
PH: 7.5 (ref 5.0–8.0)
PROTEIN: NEGATIVE mg/dL
Protein, ur: NEGATIVE mg/dL
SPECIFIC GRAVITY, URINE: 1.02 (ref 1.005–1.030)
SPECIFIC GRAVITY, URINE: 1.019 (ref 1.005–1.030)
pH: 6.5 (ref 5.0–8.0)

## 2015-12-16 LAB — CBC WITH DIFFERENTIAL/PLATELET
BASOS ABS: 0 10*3/uL (ref 0.0–0.1)
Basophils Relative: 0 %
Eosinophils Absolute: 0.1 10*3/uL (ref 0.0–0.7)
Eosinophils Relative: 1 %
HCT: 37.3 % — ABNORMAL LOW (ref 39.0–52.0)
HEMOGLOBIN: 11.7 g/dL — AB (ref 13.0–17.0)
LYMPHS ABS: 2 10*3/uL (ref 0.7–4.0)
Lymphocytes Relative: 14 %
MCH: 19.1 pg — ABNORMAL LOW (ref 26.0–34.0)
MCHC: 31.4 g/dL (ref 30.0–36.0)
MCV: 60.7 fL — ABNORMAL LOW (ref 78.0–100.0)
MONO ABS: 0.6 10*3/uL (ref 0.1–1.0)
MONOS PCT: 4 %
NEUTROS PCT: 81 %
Neutro Abs: 11.5 10*3/uL — ABNORMAL HIGH (ref 1.7–7.7)
PLATELETS: 184 10*3/uL (ref 150–400)
RBC: 6.14 MIL/uL — AB (ref 4.22–5.81)
RDW: 15.2 % (ref 11.5–15.5)
WBC: 14.2 10*3/uL — AB (ref 4.0–10.5)

## 2015-12-16 LAB — COMPREHENSIVE METABOLIC PANEL
ALBUMIN: 5 g/dL (ref 3.5–5.0)
ALT: 17 U/L (ref 17–63)
ANION GAP: 9 (ref 5–15)
AST: 17 U/L (ref 15–41)
Alkaline Phosphatase: 63 U/L (ref 38–126)
BILIRUBIN TOTAL: 0.7 mg/dL (ref 0.3–1.2)
BUN: 9 mg/dL (ref 6–20)
CHLORIDE: 99 mmol/L — AB (ref 101–111)
CO2: 28 mmol/L (ref 22–32)
Calcium: 9.4 mg/dL (ref 8.9–10.3)
Creatinine, Ser: 0.92 mg/dL (ref 0.61–1.24)
GFR calc Af Amer: >60 mL/min (ref >60)
Glucose, Bld: 249 mg/dL — ABNORMAL HIGH (ref 65–99)
POTASSIUM: 4 mmol/L (ref 3.5–5.1)
Sodium: 136 mmol/L (ref 135–145)
TOTAL PROTEIN: 8.2 g/dL — AB (ref 6.5–8.1)

## 2015-12-16 LAB — CBC
HEMATOCRIT: 40.8 % (ref 39.0–52.0)
HEMOGLOBIN: 13.3 g/dL (ref 13.0–17.0)
MCH: 19.4 pg — ABNORMAL LOW (ref 26.0–34.0)
MCHC: 32.6 g/dL (ref 30.0–36.0)
MCV: 59.4 fL — AB (ref 78.0–100.0)
Platelets: 188 10*3/uL (ref 150–400)
RBC: 6.87 MIL/uL — ABNORMAL HIGH (ref 4.22–5.81)
RDW: 15.2 % (ref 11.5–15.5)
WBC: 23 10*3/uL — AB (ref 4.0–10.5)

## 2015-12-16 LAB — CBG MONITORING, ED
GLUCOSE-CAPILLARY: 274 mg/dL — AB (ref 65–99)
Glucose-Capillary: 227 mg/dL — ABNORMAL HIGH (ref 65–99)
Glucose-Capillary: 222 mg/dL — ABNORMAL HIGH (ref 65–99)
Glucose-Capillary: 261 mg/dL — ABNORMAL HIGH (ref 65–99)

## 2015-12-16 LAB — LIPASE, BLOOD: LIPASE: 111 U/L — AB (ref 11–51)

## 2015-12-16 LAB — GLUCOSE, CAPILLARY
Glucose-Capillary: 248 mg/dL — ABNORMAL HIGH (ref 65–99)
Glucose-Capillary: 241 mg/dL — ABNORMAL HIGH (ref 65–99)

## 2015-12-16 SURGERY — APPENDECTOMY, LAPAROSCOPIC
Anesthesia: General | Site: Abdomen

## 2015-12-16 MED ORDER — INSULIN GLARGINE 100 UNIT/ML ~~LOC~~ SOLN
10.0000 [IU] | Freq: Once | SUBCUTANEOUS | Status: AC
  Administered 2015-12-16: 10 [IU] via SUBCUTANEOUS
  Filled 2015-12-16: qty 0.1

## 2015-12-16 MED ORDER — PIPERACILLIN-TAZOBACTAM 3.375 G IVPB
3.3750 g | Freq: Three times a day (TID) | INTRAVENOUS | Status: DC
  Administered 2015-12-16 – 2015-12-23 (×20): 3.375 g via INTRAVENOUS
  Filled 2015-12-16 (×22): qty 50

## 2015-12-16 MED ORDER — LIDOCAINE HCL (CARDIAC) 20 MG/ML IV SOLN
INTRAVENOUS | Status: AC
  Filled 2015-12-16: qty 5

## 2015-12-16 MED ORDER — DIPHENHYDRAMINE HCL 50 MG/ML IJ SOLN: 25.0000 mg | Freq: Four times a day (QID) | INTRAMUSCULAR | Status: DC | PRN

## 2015-12-16 MED ORDER — GLYCOPYRROLATE 0.2 MG/ML IJ SOLN
INTRAMUSCULAR | Status: AC
  Filled 2015-12-16: qty 3

## 2015-12-16 MED ORDER — DIPHENHYDRAMINE HCL 25 MG PO CAPS: 25.0000 mg | ORAL_CAPSULE | Freq: Four times a day (QID) | ORAL | Status: DC | PRN

## 2015-12-16 MED ORDER — METRONIDAZOLE IN NACL 5-0.79 MG/ML-% IV SOLN
500.0000 mg | Freq: Once | INTRAVENOUS | Status: DC
  Filled 2015-12-16: qty 100

## 2015-12-16 MED ORDER — DEXTROSE 5 % IV SOLN
2.0000 g | Freq: Once | INTRAVENOUS | Status: DC
  Filled 2015-12-16: qty 2

## 2015-12-16 MED ORDER — LACTATED RINGERS IR SOLN
Status: DC | PRN
  Administered 2015-12-16: 2000 mL

## 2015-12-16 MED ORDER — MORPHINE SULFATE (PF) 2 MG/ML IV SOLN
1.0000 mg | INTRAVENOUS | Status: DC | PRN
  Administered 2015-12-16 – 2015-12-17 (×4): 2 mg via INTRAVENOUS
  Administered 2015-12-18: 4 mg via INTRAVENOUS
  Administered 2015-12-18 – 2015-12-20 (×7): 2 mg via INTRAVENOUS
  Filled 2015-12-16 (×13): qty 1

## 2015-12-16 MED ORDER — LISINOPRIL 20 MG PO TABS
20.0000 mg | ORAL_TABLET | Freq: Every day | ORAL | Status: DC
  Administered 2015-12-16 – 2015-12-23 (×8): 20 mg via ORAL
  Filled 2015-12-16 (×8): qty 1

## 2015-12-16 MED ORDER — ONDANSETRON HCL 4 MG/2ML IJ SOLN
INTRAMUSCULAR | Status: DC | PRN
  Administered 2015-12-16: 4 mg via INTRAVENOUS

## 2015-12-16 MED ORDER — SUCCINYLCHOLINE CHLORIDE 20 MG/ML IJ SOLN
INTRAMUSCULAR | Status: DC | PRN
  Administered 2015-12-16: 100 mg via INTRAVENOUS

## 2015-12-16 MED ORDER — GABAPENTIN 400 MG PO CAPS
400.0000 mg | ORAL_CAPSULE | Freq: Three times a day (TID) | ORAL | Status: DC
  Administered 2015-12-16 – 2015-12-23 (×21): 400 mg via ORAL
  Filled 2015-12-16 (×21): qty 1

## 2015-12-16 MED ORDER — INSULIN ASPART 100 UNIT/ML ~~LOC~~ SOLN
SUBCUTANEOUS | Status: AC
  Filled 2015-12-16: qty 1

## 2015-12-16 MED ORDER — FLEET ENEMA 7-19 GM/118ML RE ENEM
1.0000 | ENEMA | Freq: Once | RECTAL | Status: AC
  Administered 2015-12-16: 1 via RECTAL
  Filled 2015-12-16: qty 1

## 2015-12-16 MED ORDER — ONDANSETRON HCL 4 MG/2ML IJ SOLN: 4.0000 mg | Freq: Four times a day (QID) | INTRAMUSCULAR | Status: DC | PRN

## 2015-12-16 MED ORDER — NEOSTIGMINE METHYLSULFATE 10 MG/10ML IV SOLN
INTRAVENOUS | Status: AC
  Filled 2015-12-16: qty 1

## 2015-12-16 MED ORDER — BUPIVACAINE HCL (PF) 0.5 % IJ SOLN
INTRAMUSCULAR | Status: DC | PRN
  Administered 2015-12-16: 20 mL

## 2015-12-16 MED ORDER — PIPERACILLIN-TAZOBACTAM 3.375 G IVPB 30 MIN
3.3750 g | Freq: Once | INTRAVENOUS | Status: AC
  Administered 2015-12-16: 3.375 g via INTRAVENOUS
  Filled 2015-12-16: qty 50

## 2015-12-16 MED ORDER — IOPAMIDOL (ISOVUE-300) INJECTION 61%
100.0000 mL | Freq: Once | INTRAVENOUS | Status: AC | PRN
  Administered 2015-12-16: 100 mL via INTRAVENOUS

## 2015-12-16 MED ORDER — MORPHINE SULFATE (PF) 4 MG/ML IV SOLN
4.0000 mg | Freq: Once | INTRAVENOUS | Status: AC
  Administered 2015-12-16: 4 mg via INTRAVENOUS
  Filled 2015-12-16: qty 1

## 2015-12-16 MED ORDER — AMLODIPINE BESYLATE 10 MG PO TABS
10.0000 mg | ORAL_TABLET | Freq: Every day | ORAL | Status: DC
  Administered 2015-12-16 – 2015-12-23 (×8): 10 mg via ORAL
  Filled 2015-12-16: qty 1
  Filled 2015-12-16: qty 2
  Filled 2015-12-16 (×6): qty 1

## 2015-12-16 MED ORDER — ROCURONIUM BROMIDE 100 MG/10ML IV SOLN
INTRAVENOUS | Status: DC | PRN
  Administered 2015-12-16: 30 mg via INTRAVENOUS

## 2015-12-16 MED ORDER — ONDANSETRON 4 MG PO TBDP: 4.0000 mg | ORAL_TABLET | Freq: Four times a day (QID) | ORAL | Status: DC | PRN

## 2015-12-16 MED ORDER — MEPERIDINE HCL 50 MG/ML IJ SOLN: 6.2500 mg | INTRAMUSCULAR | Status: DC | PRN

## 2015-12-16 MED ORDER — LACTATED RINGERS IV SOLN
INTRAVENOUS | Status: DC | PRN
  Administered 2015-12-16 (×2): via INTRAVENOUS

## 2015-12-16 MED ORDER — SODIUM CHLORIDE 0.9 % IV BOLUS (SEPSIS)
1000.0000 mL | Freq: Once | INTRAVENOUS | Status: AC
  Administered 2015-12-16: 1000 mL via INTRAVENOUS

## 2015-12-16 MED ORDER — LACTATED RINGERS IV SOLN: INTRAVENOUS | Status: DC

## 2015-12-16 MED ORDER — PROPOFOL 10 MG/ML IV BOLUS
INTRAVENOUS | Status: AC
  Filled 2015-12-16: qty 20

## 2015-12-16 MED ORDER — ONDANSETRON HCL 4 MG/2ML IJ SOLN
INTRAMUSCULAR | Status: AC
  Filled 2015-12-16: qty 2

## 2015-12-16 MED ORDER — LIDOCAINE HCL (CARDIAC) 20 MG/ML IV SOLN
INTRAVENOUS | Status: DC | PRN
  Administered 2015-12-16: 40 mg via INTRAVENOUS

## 2015-12-16 MED ORDER — INSULIN ASPART 100 UNIT/ML ~~LOC~~ SOLN
0.0000 [IU] | Freq: Three times a day (TID) | SUBCUTANEOUS | Status: DC
  Administered 2015-12-17 (×2): 3 [IU] via SUBCUTANEOUS
  Administered 2015-12-17: 5 [IU] via SUBCUTANEOUS
  Administered 2015-12-18: 8 [IU] via SUBCUTANEOUS
  Administered 2015-12-18 (×2): 5 [IU] via SUBCUTANEOUS
  Administered 2015-12-19: 8 [IU] via SUBCUTANEOUS
  Administered 2015-12-19 (×2): 5 [IU] via SUBCUTANEOUS

## 2015-12-16 MED ORDER — FENTANYL CITRATE (PF) 250 MCG/5ML IJ SOLN
INTRAMUSCULAR | Status: DC | PRN
Start: 2015-12-16 — End: 2015-12-16
  Administered 2015-12-16 (×5): 50 ug via INTRAVENOUS

## 2015-12-16 MED ORDER — INSULIN ASPART 100 UNIT/ML ~~LOC~~ SOLN
4.0000 [IU] | Freq: Three times a day (TID) | SUBCUTANEOUS | Status: DC
  Administered 2015-12-17 – 2015-12-23 (×15): 4 [IU] via SUBCUTANEOUS

## 2015-12-16 MED ORDER — METOCLOPRAMIDE HCL 5 MG/ML IJ SOLN: 10.0000 mg | Freq: Once | INTRAMUSCULAR | Status: DC | PRN

## 2015-12-16 MED ORDER — INSULIN ASPART 100 UNIT/ML ~~LOC~~ SOLN
0.0000 [IU] | Freq: Every day | SUBCUTANEOUS | Status: DC
  Administered 2015-12-16 – 2015-12-19 (×2): 2 [IU] via SUBCUTANEOUS

## 2015-12-16 MED ORDER — ONDANSETRON HCL 4 MG/2ML IJ SOLN
4.0000 mg | Freq: Once | INTRAMUSCULAR | Status: AC
  Administered 2015-12-16: 4 mg via INTRAVENOUS
  Filled 2015-12-16: qty 2

## 2015-12-16 MED ORDER — POTASSIUM CHLORIDE IN NACL 20-0.9 MEQ/L-% IV SOLN
INTRAVENOUS | Status: DC
  Administered 2015-12-16 – 2015-12-21 (×7): via INTRAVENOUS
  Filled 2015-12-16 (×11): qty 1000

## 2015-12-16 MED ORDER — PROPOFOL 10 MG/ML IV BOLUS
INTRAVENOUS | Status: DC | PRN
  Administered 2015-12-16: 200 mg via INTRAVENOUS

## 2015-12-16 MED ORDER — INSULIN ASPART 100 UNIT/ML ~~LOC~~ SOLN
5.0000 [IU] | Freq: Once | SUBCUTANEOUS | Status: AC
  Administered 2015-12-16: 5 [IU] via SUBCUTANEOUS

## 2015-12-16 MED ORDER — ONDANSETRON 4 MG PO TBDP: 4.0000 mg | ORAL_TABLET | Freq: Three times a day (TID) | ORAL | 0 refills | Status: DC | PRN

## 2015-12-16 MED ORDER — FENTANYL CITRATE (PF) 100 MCG/2ML IJ SOLN
25.0000 ug | INTRAMUSCULAR | Status: DC | PRN
  Administered 2015-12-16: 50 ug via INTRAVENOUS

## 2015-12-16 MED ORDER — FENTANYL CITRATE (PF) 250 MCG/5ML IJ SOLN
INTRAMUSCULAR | Status: AC
  Filled 2015-12-16: qty 5

## 2015-12-16 MED ORDER — OXYCODONE-ACETAMINOPHEN 5-325 MG PO TABS: 1.0000 | ORAL_TABLET | ORAL | Status: DC | PRN

## 2015-12-16 MED ORDER — BUPIVACAINE HCL (PF) 0.5 % IJ SOLN
INTRAMUSCULAR | Status: AC
  Filled 2015-12-16: qty 30

## 2015-12-16 MED ORDER — LABETALOL HCL 5 MG/ML IV SOLN
INTRAVENOUS | Status: DC | PRN
  Administered 2015-12-16: 2.5 mg via INTRAVENOUS

## 2015-12-16 MED ORDER — FENTANYL CITRATE (PF) 100 MCG/2ML IJ SOLN
INTRAMUSCULAR | Status: AC
  Filled 2015-12-16: qty 2

## 2015-12-16 MED ORDER — MAGNESIUM CITRATE PO SOLN
0.5000 | Freq: Once | ORAL | Status: AC
  Administered 2015-12-16: 0.5 via ORAL
  Filled 2015-12-16: qty 296

## 2015-12-16 MED ORDER — SUGAMMADEX SODIUM 200 MG/2ML IV SOLN
INTRAVENOUS | Status: DC | PRN
  Administered 2015-12-16: 200 mg via INTRAVENOUS

## 2015-12-16 MED ORDER — MIDAZOLAM HCL 2 MG/2ML IJ SOLN
INTRAMUSCULAR | Status: AC
  Filled 2015-12-16: qty 2

## 2015-12-16 SURGICAL SUPPLY — 35 items
APPLIER CLIP 5 13 M/L LIGAMAX5 (MISCELLANEOUS)
CHLORAPREP W/TINT 26ML (MISCELLANEOUS) ×3 IMPLANT
CLIP APPLIE 5 13 M/L LIGAMAX5 (MISCELLANEOUS) IMPLANT
COVER SURGICAL LIGHT HANDLE (MISCELLANEOUS) ×3 IMPLANT
CUTTER FLEX LINEAR 45M (STAPLE) ×3 IMPLANT
DRAPE LAPAROSCOPIC ABDOMINAL (DRAPES) ×3 IMPLANT
ELECT REM PT RETURN 9FT ADLT (ELECTROSURGICAL) ×3
ELECTRODE REM PT RTRN 9FT ADLT (ELECTROSURGICAL) ×1 IMPLANT
GLOVE BIOGEL PI IND STRL 7.0 (GLOVE) ×1 IMPLANT
GLOVE BIOGEL PI IND STRL 7.5 (GLOVE) ×1 IMPLANT
GLOVE BIOGEL PI IND STRL 8 (GLOVE) ×1 IMPLANT
GLOVE BIOGEL PI INDICATOR 7.0 (GLOVE) ×2
GLOVE BIOGEL PI INDICATOR 7.5 (GLOVE) ×2
GLOVE BIOGEL PI INDICATOR 8 (GLOVE) ×2
GLOVE SURG SIGNA 7.5 PF LTX (GLOVE) ×3 IMPLANT
GLOVE SURG SS PI 7.5 STRL IVOR (GLOVE) ×3 IMPLANT
GLOVE SURG SS PI 8.0 STRL IVOR (GLOVE) ×3 IMPLANT
GOWN STRL REUS W/TWL XL LVL3 (GOWN DISPOSABLE) ×9 IMPLANT
IRRIG SUCT STRYKERFLOW 2 WTIP (MISCELLANEOUS) ×3
IRRIGATION SUCT STRKRFLW 2 WTP (MISCELLANEOUS) ×1 IMPLANT
KIT BASIN OR (CUSTOM PROCEDURE TRAY) ×3 IMPLANT
LIQUID BAND (GAUZE/BANDAGES/DRESSINGS) ×3 IMPLANT
NS IRRIG 1000ML POUR BTL (IV SOLUTION) ×3 IMPLANT
POUCH SPECIMEN RETRIEVAL 10MM (ENDOMECHANICALS) ×3 IMPLANT
RELOAD 45 VASCULAR/THIN (ENDOMECHANICALS) IMPLANT
RELOAD STAPLE TA45 3.5 REG BLU (ENDOMECHANICALS) ×3 IMPLANT
SHEARS HARMONIC ACE PLUS 36CM (ENDOMECHANICALS) ×3 IMPLANT
SUT MNCRL AB 4-0 PS2 18 (SUTURE) ×3 IMPLANT
SUT VICRYL 0 UR6 27IN ABS (SUTURE) ×3 IMPLANT
TOWEL OR 17X26 10 PK STRL BLUE (TOWEL DISPOSABLE) ×3 IMPLANT
TOWEL OR NON WOVEN STRL DISP B (DISPOSABLE) ×3 IMPLANT
TRAY LAPAROSCOPIC (CUSTOM PROCEDURE TRAY) ×3 IMPLANT
TROCAR BLADELESS OPT 5 75 (ENDOMECHANICALS) ×6 IMPLANT
TROCAR XCEL BLUNT TIP 100MML (ENDOMECHANICALS) ×3 IMPLANT
TUBING INSUF HEATED (TUBING) ×3 IMPLANT

## 2015-12-16 NOTE — ED Triage Notes (Addendum)
BIB [facility].  Seen at [facility] last night for same.  Right sided abdominal pain.  RLQ most painful.  Audible bowel sounds.  Reported he finished mag citrate last night, but did not drink water with it, so no BM to report.  Got an enema at [facility].  No success.     Pt reports yesterday, he ate a pear, but vomited it back up.  Tried grilled cheese, also came back up.

## 2015-12-16 NOTE — Discharge Instructions (Signed)
He was seen today for abdominal pain. Your workup is largely reassuring. Will be given magnesium citrate. If you do not have a bowel movement in the next 4-6 hours, take the other half bottle of magnesium citrate. If her pain worsens or you develop fever or worsening symptoms you should be reevaluated.

## 2015-12-16 NOTE — Transfer of Care (Signed)
Immediate Anesthesia Transfer of Care Note  Patient: Frank Schneider  Procedure(s) Performed: Procedure(s): APPENDECTOMY LAPAROSCOPIC (N/A)  Patient Location: PACU  Anesthesia Type:General  Level of Consciousness: awake  Airway & Oxygen Therapy: Patient Spontanous Breathing and Patient connected to face mask oxygen  Post-op Assessment: Report given to RN and Post -op Vital signs reviewed and stable  Post vital signs: Reviewed and stable  Last Vitals:  Vitals:   12/16/15 1415 12/16/15 1430  BP:    Pulse: 81 80  Resp: 17 21  Temp:      Last Pain:  Vitals:   12/16/15 1444  TempSrc:   PainSc: 6          Complications: No apparent anesthesia complications

## 2015-12-16 NOTE — ED Notes (Signed)
Surgeon in to talk to patient at this time.

## 2015-12-16 NOTE — ED Notes (Signed)
Patient aware of need for urine sample. Pt reports unable to void at this time. Intervention: administer 1 L saline bolus, left urinal at bedside. Plan: re-assess.

## 2015-12-16 NOTE — ED Provider Notes (Signed)
[facility] DEPT Provider Note   CSN: [id number] Arrival date & time: 12/16/15  0906     History   Chief Complaint Chief Complaint  Patient presents with  . Abdominal Pain  . Constipation    HPI Frank Schneider is a 40 y.o. male.  40yo M w/ PMH including IDDM, CVA, HTN, R eye blindness who p/w abdominal pain. The patient began having right-sided abdominal pain yesterday and presented to [facility] last night. Last bowel movement was yesterday morning but he felt like he was constipated so he was given magnesium citrate. He declined a CT scan last night. He had vomiting yesterday after trying to eat a pair and again after trying to eat grilled cheese. He reports that his pain has worsened today and is now severe and constant. The pain is generalized but worse in his right lower quadrant. He has not had anything to eat today, no vomiting today. No bowel movement since magnesium citrate and enema last night. He has never had this pain before. No urinary symptoms, fever, chest pain, or shortness of breath.   The history is provided by the patient.  Abdominal Pain   Associated symptoms include constipation.  Constipation   Associated symptoms include abdominal pain.    Past Medical History:  Diagnosis Date  . Blind right eye   . Diabetes mellitus    Takes Metformin  . Hypertension   . Priapism   . Stroke ([place])   . Tobacco use   . Vision abnormalities     Patient Active Problem List   Diagnosis Date Noted  . Right cataract 10/04/2015  . Tobacco dependence 10/04/2015  . Absolute anemia 08/07/2015  . Neuropathy ([place]) 08/07/2015  . Cerebrovascular accident (CVA) ([place])   . Leukocytosis   . Stroke (cerebrum) ([place]) 05/09/2015  . 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 Hyperlipidemia 02/14/2015  . History of CVA (cerebrovascular accident) 02/14/2015  . CVA (cerebral vascular accident) ([place]) 01/01/2015  . Diabetes mellitus type 2 with  complications, uncontrolled ([place]) 01/01/2015  . Stroke ([place])   . Essential hypertension   . Smoker   . Tobacco use     Past Surgical History:  Procedure Laterality Date  . INCISION AND DRAINAGE PERIRECTAL ABSCESS  07/28/2011   Procedure: IRRIGATION AND DEBRIDEMENT PERIRECTAL ABSCESS;  Surgeon: Adin Hector, MD;  Location: [facility] ORS;  Service: General;  Laterality: N/A;   of skin muscle and subcutaneous tissue of perimeum  8cmx12cm area   . PENECTOMY         Home Medications    Prior to Admission medications   Medication Sig Start Date End Date Taking? Authorizing Provider  amLODipine (NORVASC) 10 MG tablet Take 1 tablet (10 mg total) by mouth daily. 10/04/15  Yes Dorena Dew, FNP  clopidogrel (PLAVIX) 75 MG tablet Take 1 tablet (75 mg total) by mouth daily. 10/04/15  Yes Dorena Dew, FNP  fenofibrate (TRICOR) 145 MG tablet Take 1 tablet (145 mg total) by mouth daily. 10/04/15  Yes Dorena Dew, FNP  ferrous sulfate 325 (65 FE) MG tablet TAKE 1 TABLET BY MOUTH DAILY WITH BREAKFAST 11/25/15  Yes Micheline Chapman, NP  gabapentin (NEURONTIN) 400 MG capsule Take 1 capsule (400 mg total) by mouth 3 (three) times daily. 10/04/15  Yes Dorena Dew, FNP  insulin glargine (LANTUS) 100 UNIT/ML injection Inject 0.2 mLs (20 Units total) into the skin daily. 06/12/15  Yes Tresa Garter, MD  lisinopril (PRINIVIL,ZESTRIL)  20 MG tablet Take 1 tablet (20 mg total) by mouth daily. 08/07/15  Yes Dorena Dew, FNP  Blood Glucose Monitoring Suppl (TRUE METRIX AIR GLUCOSE METER) DEVI 1 each by Does not apply route 2 (two) times daily at 10 AM and 5 PM. 01/15/15   Dorena Dew, FNP  glucose blood (TRUE METRIX BLOOD GLUCOSE TEST) test strip Use as instructed 01/15/15   Dorena Dew, FNP  Insulin Syringe-Needle U-100 (INSULIN SYRINGE .5CC/30GX1/2") 30G X 1/2" 0.5 ML MISC 100 11/25/15   Micheline Chapman, NP  Lancets (FREESTYLE) lancets Use as instructed 01/03/15   Reyne Dumas, MD    ondansetron (ZOFRAN ODT) 4 MG disintegrating tablet Take 1 tablet (4 mg total) by mouth every 8 (eight) hours as needed for nausea or vomiting. 12/16/15   Merryl Hacker, MD    Family History Family History  Problem Relation Age of Onset  . Diabetes Mother   . Hypertension Mother   . Diabetes Maternal Grandmother   . Hypertension Maternal Grandmother   . Depression Maternal Grandmother     Social History Social History  Substance Use Topics  . Smoking status: Current Some Day Smoker    Packs/day: 0.50    Years: 8.00    Types: Cigarettes  . Smokeless tobacco: Never Used  . Alcohol use No     Allergies   Review of patient's allergies indicates no known allergies.   Review of Systems Review of Systems  Gastrointestinal: Positive for abdominal pain and constipation.   10 Systems reviewed and are negative for acute change except as noted in the HPI.   Physical Exam Updated Vital Signs BP 161/84 (BP Location: Left Arm)   Pulse 82   Temp 98.6 F (37 C) (Oral)   Resp 22   SpO2 97%   Physical Exam  Constitutional: He is oriented to person, place, and time. He appears well-developed and well-nourished. No distress.  Uncomfortable, rubbing abdomen  HENT:  Head: Normocephalic and atraumatic.  Moist mucous membranes  Eyes: Conjunctivae are normal.  Left pupil reactive to light, right pupil cloudy and unreactive  Neck: Neck supple.  Cardiovascular: Normal rate, regular rhythm and normal heart sounds.   No murmur heard. Pulmonary/Chest: Effort normal and breath sounds normal.  Abdominal: Soft. Bowel sounds are normal. He exhibits no distension. There is tenderness.  Generalized tenderness to palpation worst in the right lower quadrant, no peritonitis  Musculoskeletal: He exhibits no edema.  Neurological: He is alert and oriented to person, place, and time.  Fluent speech  Skin: Skin is warm and dry.  Psychiatric: He has a normal mood and affect. Judgment normal.   Nursing note and vitals reviewed.    ED Treatments / Results  Labs (all labs ordered are listed, but only abnormal results are displayed) Labs Reviewed  LIPASE, BLOOD - Abnormal; Notable for the following:       Result Value   Lipase 111 (*)    All other components within normal limits  COMPREHENSIVE METABOLIC PANEL - Abnormal; Notable for the following:    Chloride 99 (*)    Glucose, Bld 249 (*)    Total Protein 8.2 (*)    All other components within normal limits  CBC - Abnormal; Notable for the following:    WBC 23.0 (*)    RBC 6.87 (*)    MCV 59.4 (*)    MCH 19.4 (*)    All other components within normal limits  URINALYSIS, ROUTINE W REFLEX MICROSCOPIC (  NOT AT [facility]) - Abnormal; Notable for the following:    Glucose, UA 500 (*)    Ketones, ur 15 (*)    All other components within normal limits  CBG MONITORING, ED - Abnormal; Notable for the following:    Glucose-Capillary 274 (*)    All other components within normal limits  CBG MONITORING, ED - Abnormal; Notable for the following:    Glucose-Capillary 227 (*)    All other components within normal limits    EKG  EKG Interpretation None       Radiology Dg Abdomen 1 View  Result Date: 12/15/2015 CLINICAL DATA:  Right-sided abdominal pain.  Nausea and vomiting. EXAM: ABDOMEN - 1 VIEW COMPARISON:  CT pelvis 07/28/2011 FINDINGS: The bowel gas pattern is normal. Small volume of colonic stool in the right colon. No radio-opaque calculi. Atherosclerosis is noted. Degenerative change of both hips. IMPRESSION: Normal bowel gas pattern. Electronically Signed   By: Jeb Levering M.D.   On: 12/15/2015 23:59   Ct Abdomen Pelvis W Contrast  Result Date: 12/16/2015 CLINICAL DATA:  Right lower quadrant pain for 2 days. EXAM: CT ABDOMEN AND PELVIS WITH CONTRAST TECHNIQUE: Multidetector CT imaging of the abdomen and pelvis was performed using the standard protocol following bolus administration of intravenous contrast. CONTRAST:   152mL ISOVUE-300 IOPAMIDOL (ISOVUE-300) INJECTION 61% COMPARISON:  Radiograph 12/15/2015 FINDINGS: Lower chest:  No acute findings. Hepatobiliary: No masses or other significant abnormality. Pancreas: No mass, inflammatory changes, or other significant abnormality. Spleen: Within normal limits in size and appearance. Adrenals/Urinary Tract: No masses identified. No evidence of hydronephrosis. Stomach/Bowel: No evidence of obstruction. The appendix is fluid-filled and dilated with indistinct borders. There is a 15 mm appendicolith at the appendiceal base. There is significant periappendiceal fat stranding with minimal amount of free fluid posterior to the inflamed appendix and within the right pericolic gutter. No definite evidence of abscess formation. Vascular/Lymphatic: No pathologically enlarged lymph nodes. No evidence of abdominal aortic aneurysm. Minimal atherosclerotic disease of the distal aorta and common iliac arteries. Reproductive: No mass or other significant abnormality. Other: None. Musculoskeletal:  No suspicious bone lesions identified. IMPRESSION: Acute appendicitis with indistinct appearance of the appendiceal wall and small amount of retro appendiceal fluid. Micro rupture cannot be excluded. Frank abscess formation is not seen. Minimal atherosclerotic disease of the distal aorta and common iliac arteries. Electronically Signed   By: Fidela Salisbury M.D.   On: 12/16/2015 13:08    Procedures Procedures (including critical care time)  Medications Ordered in ED Medications  piperacillin-tazobactam (ZOSYN) IVPB 3.375 g (3.375 g Intravenous New Bag/Given 12/16/15 1407)  morphine 4 MG/ML injection 4 mg (4 mg Intravenous Given 12/16/15 1036)  sodium chloride 0.9 % bolus 1,000 mL (0 mLs Intravenous Stopped 12/16/15 1144)  ondansetron (ZOFRAN) injection 4 mg (4 mg Intravenous Given 12/16/15 1035)  insulin glargine (LANTUS) injection 10 Units (10 Units Subcutaneous Given 12/16/15 1140)   iopamidol (ISOVUE-300) 61 % injection 100 mL (100 mLs Intravenous Contrast Given 12/16/15 1249)  morphine 4 MG/ML injection 4 mg (4 mg Intravenous Given 12/16/15 1400)     Initial Impression / Assessment and Plan / ED Course  I have reviewed the triage vital signs and the nursing notes.  Pertinent labs & imaging results that were available during my care of the patient were reviewed by me and considered in my medical decision making (see chart for details).  Clinical Course   Patient with ongoing abdominal pain worst in the right lower quadrant, he was  evaluated last night and given an enema as well as magnesium citrate with no bowel movement. Patient deferred CT scan last night. On exam, he was uncomfortable but nontoxic and in no acute distress. Vital signs notable for mild hypertension. He had generalized tenderness, worst in the right lower quadrant, no distention or peritonitis. Obtained above lab work, gave the patient Zofran, morphine, and an IV fluid bolus. Because of his ongoing pain, I discussed the need for CT for better evaluation and patient agreed.  Labs notable for WBC 23,000, glucose 249 with normal anion gap. CT shows acute appendicitis with surrounding retroperitoneal fluid and possible micro-rupture. Gave zosyn. Discussed with general surgery, PA Will Creig Hines working w/ Dr. Ninfa Linden. They requested medicine admission and I discussed w/ hospitalist Dr. Aileen Fass who was willing to consult but felt pt needed admission to surgery. Pt will be admitted to surgery and taken to OR.  Final Clinical Impressions(s) / ED Diagnoses   Final diagnoses:  Acute appendicitis with generalized peritonitis    New Prescriptions New Prescriptions   No medications on file     Sharlett Iles, MD 12/16/15 1427

## 2015-12-16 NOTE — H&P (Signed)
Frank Schneider is an 40 y.o. male.   Chief Complaint: Right lower quadrant abdominal pain HPI: This gentleman presents with right lower quadrant abdominal pain. He has multiple medical problems and has had a CVA earlier this years and is on Plavix. He was seen at [facility] yesterday and had an elevated white blood count. He apparently improved and did not want a CT scan. He now returns with 10 out of 10 right lower quadrant abdominal pain and nausea. He denies fevers. He denies emesis. He still has some residual left-sided weakness from his CVA.  Past Medical History:  Diagnosis Date  . Blind right eye   . Diabetes mellitus    Takes Metformin  . Hypertension   . Priapism   . Stroke ([place])   . Tobacco use   . Vision abnormalities     Past Surgical History:  Procedure Laterality Date  . INCISION AND DRAINAGE PERIRECTAL ABSCESS  07/28/2011   Procedure: IRRIGATION AND DEBRIDEMENT PERIRECTAL ABSCESS;  Surgeon: Adin Hector, MD;  Location: [facility] ORS;  Service: General;  Laterality: N/A;   of skin muscle and subcutaneous tissue of perimeum  8cmx12cm area   . PENECTOMY      Family History  Problem Relation Age of Onset  . Diabetes Mother   . Hypertension Mother   . Diabetes Maternal Grandmother   . Hypertension Maternal Grandmother   . Depression Maternal Grandmother    Social History:  reports that he has been smoking Cigarettes.  He has a 4.00 pack-year smoking history. He has never used smokeless tobacco. He reports that he does not drink alcohol or use drugs.  Allergies: No Known Allergies   (Not in a hospital admission)  Results for orders placed or performed during the hospital encounter of 12/16/15 (from the past 48 hour(s))  CBG monitoring, ED     Status: Abnormal   Collection Time: 12/16/15  9:20 AM  Result Value Ref Range   Glucose-Capillary 274 (H) 65 - 99 mg/dL  Urinalysis, Routine w reflex microscopic     Status: Abnormal   Collection Time: 12/16/15  9:28 AM  Result  Value Ref Range   Color, Urine YELLOW YELLOW   APPearance CLEAR CLEAR   Specific Gravity, Urine 1.019 1.005 - 1.030   pH 7.5 5.0 - 8.0   Glucose, UA 500 (A) NEGATIVE mg/dL   Hgb urine dipstick NEGATIVE NEGATIVE   Bilirubin Urine NEGATIVE NEGATIVE   Ketones, ur 15 (A) NEGATIVE mg/dL   Protein, ur NEGATIVE NEGATIVE mg/dL   Nitrite NEGATIVE NEGATIVE   Leukocytes, UA NEGATIVE NEGATIVE    Comment: MICROSCOPIC NOT DONE ON URINES WITH NEGATIVE PROTEIN, BLOOD, LEUKOCYTES, NITRITE, OR GLUCOSE <1000 mg/dL.  Lipase, blood     Status: Abnormal   Collection Time: 12/16/15 10:18 AM  Result Value Ref Range   Lipase 111 (H) 11 - 51 U/L  Comprehensive metabolic panel     Status: Abnormal   Collection Time: 12/16/15 10:18 AM  Result Value Ref Range   Sodium 136 135 - 145 mmol/L   Potassium 4.0 3.5 - 5.1 mmol/L   Chloride 99 (L) 101 - 111 mmol/L   CO2 28 22 - 32 mmol/L   Glucose, Bld 249 (H) 65 - 99 mg/dL   BUN 9 6 - 20 mg/dL   Creatinine, Ser 0.92 0.61 - 1.24 mg/dL   Calcium 9.4 8.9 - 10.3 mg/dL   Total Protein 8.2 (H) 6.5 - 8.1 g/dL   Albumin 5.0 3.5 - 5.0 g/dL  AST 17 15 - 41 U/L   ALT 17 17 - 63 U/L   Alkaline Phosphatase 63 38 - 126 U/L   Total Bilirubin 0.7 0.3 - 1.2 mg/dL   GFR calc non Af Amer >60 >60 mL/min   GFR calc Af Amer >60 >60 mL/min    Comment: (NOTE) The eGFR has been calculated using the CKD EPI equation. This calculation has not been validated in all clinical situations. eGFR's persistently <60 mL/min signify possible Chronic Kidney Disease.    Anion gap 9 5 - 15  CBC     Status: Abnormal   Collection Time: 12/16/15 10:18 AM  Result Value Ref Range   WBC 23.0 (H) 4.0 - 10.5 K/uL   RBC 6.87 (H) 4.22 - 5.81 MIL/uL   Hemoglobin 13.3 13.0 - 17.0 g/dL   HCT 40.8 39.0 - 52.0 %   MCV 59.4 (L) 78.0 - 100.0 fL   MCH 19.4 (L) 26.0 - 34.0 pg   MCHC 32.6 30.0 - 36.0 g/dL   RDW 15.2 11.5 - 15.5 %   Platelets 188 150 - 400 K/uL    Comment: REPEATED TO VERIFY  CBG  monitoring, ED     Status: Abnormal   Collection Time: 12/16/15 11:24 AM  Result Value Ref Range   Glucose-Capillary 227 (H) 65 - 99 mg/dL   Comment 1 Document in Chart    Dg Abdomen 1 View  Result Date: 12/15/2015 CLINICAL DATA:  Right-sided abdominal pain.  Nausea and vomiting. EXAM: ABDOMEN - 1 VIEW COMPARISON:  CT pelvis 07/28/2011 FINDINGS: The bowel gas pattern is normal. Small volume of colonic stool in the right colon. No radio-opaque calculi. Atherosclerosis is noted. Degenerative change of both hips. IMPRESSION: Normal bowel gas pattern. Electronically Signed   By: Jeb Levering M.D.   On: 12/15/2015 23:59   Ct Abdomen Pelvis W Contrast  Result Date: 12/16/2015 CLINICAL DATA:  Right lower quadrant pain for 2 days. EXAM: CT ABDOMEN AND PELVIS WITH CONTRAST TECHNIQUE: Multidetector CT imaging of the abdomen and pelvis was performed using the standard protocol following bolus administration of intravenous contrast. CONTRAST:  131m ISOVUE-300 IOPAMIDOL (ISOVUE-300) INJECTION 61% COMPARISON:  Radiograph 12/15/2015 FINDINGS: Lower chest:  No acute findings. Hepatobiliary: No masses or other significant abnormality. Pancreas: No mass, inflammatory changes, or other significant abnormality. Spleen: Within normal limits in size and appearance. Adrenals/Urinary Tract: No masses identified. No evidence of hydronephrosis. Stomach/Bowel: No evidence of obstruction. The appendix is fluid-filled and dilated with indistinct borders. There is a 15 mm appendicolith at the appendiceal base. There is significant periappendiceal fat stranding with minimal amount of free fluid posterior to the inflamed appendix and within the right pericolic gutter. No definite evidence of abscess formation. Vascular/Lymphatic: No pathologically enlarged lymph nodes. No evidence of abdominal aortic aneurysm. Minimal atherosclerotic disease of the distal aorta and common iliac arteries. Reproductive: No mass or other significant  abnormality. Other: None. Musculoskeletal:  No suspicious bone lesions identified. IMPRESSION: Acute appendicitis with indistinct appearance of the appendiceal wall and small amount of retro appendiceal fluid. Micro rupture cannot be excluded. Frank abscess formation is not seen. Minimal atherosclerotic disease of the distal aorta and common iliac arteries. Electronically Signed   By: DFidela SalisburyM.D.   On: 12/16/2015 13:08    Review of Systems  All other systems reviewed and are negative.   Blood pressure 161/84, pulse 82, temperature 98.6 F (37 C), temperature source Oral, resp. rate 22, SpO2 97 %. Physical Exam  Constitutional: He  is oriented to person, place, and time. He appears well-developed and well-nourished. He appears distressed.  HENT:  Head: Normocephalic and atraumatic.  Right Ear: External ear normal.  Left Ear: External ear normal.  Nose: Nose normal.  Mouth/Throat: No oropharyngeal exudate.  Eyes: Conjunctivae are normal. Pupils are equal, round, and reactive to light. No scleral icterus.  Neck: Normal range of motion. No tracheal deviation present.  Cardiovascular: Normal rate, regular rhythm, normal heart sounds and intact distal pulses.   No murmur heard. Respiratory: Effort normal and breath sounds normal. No respiratory distress. He has no wheezes.  GI: Soft. There is tenderness. There is guarding.  There is severe tenderness with guarding in the right lower quadrant.  Musculoskeletal: Normal range of motion. He exhibits no edema or tenderness.  Lymphadenopathy:    He has no cervical adenopathy.  Neurological: He is alert and oriented to person, place, and time.  Skin: Skin is warm and dry. No erythema. No pallor.  Psychiatric: His behavior is normal. Judgment normal.     Assessment/Plan Acute appendicitis with an appendicolith and possible perforation  Despite being on Plavix, I believe he needs an urgent appendectomy. I discussed this with him in  detail. I discussed the surgical procedure in detail. I discussed the laparoscopic approach. I discussed the risks which includes but is not limited to bleeding, infection, injury to surrounding structures, the need for drain placement, the need to convert to an open procedure, the need for further surgery, cardiopulmonary issues, DVT, etc. He understands and wishes to proceed. Preoperative antibiotics of all reaming given as ordered by the emergency room physician. Surgery is scheduled  Kelbie Moro A, MD 12/16/2015, 2:09 PM

## 2015-12-16 NOTE — Progress Notes (Signed)
Pharmacy Antibiotic Note  Frank Schneider is a 40 y.o. male admitted on 12/16/2015 with acute appendicitis. Pharmacy has been consulted for Zosyn dosing.  Plan: Zosyn 3.375g IV x 1 over 30 minutes given in ED. Continue with Zosyn 3.375g IV q8h (infuse each dose over 4 hours).  Monitor renal function, culture results as available, clinical course.     Temp (24hrs), Avg:98.7 F (37.1 C), Min:98.6 F (37 C), Max:98.7 F (37.1 C)   Recent Labs Lab 12/15/15 2202 12/16/15 1018  WBC 14.2* 23.0*  CREATININE 1.16 0.92    CrCl cannot be calculated (Unknown ideal weight.).    No Known Allergies  Antimicrobials this admission: 8/21 >> Zosyn >>  Dose adjustments this admission: --  Microbiology results: None ordered  Thank you for allowing pharmacy to be a part of this patient's care.   Lindell Spar, PharmD, BCPS Pager: [phone] 12/16/2015 3:00 PM

## 2015-12-16 NOTE — Anesthesia Preprocedure Evaluation (Addendum)
Anesthesia Evaluation  Patient identified by MRN, date of birth, ID band Patient awake    Reviewed: Allergy & Precautions, NPO status , Patient's Chart, lab work & pertinent test results  Airway Mallampati: II  TM Distance: >3 FB Neck ROM: Full    Dental no notable dental hx. (+) Dental Advisory Given, Chipped   Pulmonary Current Smoker,    Pulmonary exam normal breath sounds clear to auscultation       Cardiovascular hypertension, Pt. on medications Normal cardiovascular exam Rhythm:Regular Rate:Normal     Neuro/Psych CVA, Residual Symptoms negative psych ROS   GI/Hepatic negative GI ROS, Neg liver ROS,   Endo/Other  diabetes, Poorly Controlled, Type 2, Insulin Dependent  Renal/GU negative Renal ROS  negative genitourinary   Musculoskeletal negative musculoskeletal ROS (+)   Abdominal   Peds negative pediatric ROS (+)  Hematology negative hematology ROS (+)   Anesthesia Other Findings   Reproductive/Obstetrics negative OB ROS                            Anesthesia Physical Anesthesia Plan  ASA: III  Anesthesia Plan: General   Post-op Pain Management:    Induction: Intravenous, Rapid sequence and Cricoid pressure planned  Airway Management Planned: Oral ETT  Additional Equipment:   Intra-op Plan:   Post-operative Plan: Extubation in OR  Informed Consent: I have reviewed the patients History and Physical, chart, labs and discussed the procedure including the risks, benefits and alternatives for the proposed anesthesia with the patient or authorized representative who has indicated his/her understanding and acceptance.   Dental advisory given  Plan Discussed with: CRNA  Anesthesia Plan Comments:         Anesthesia Quick Evaluation

## 2015-12-16 NOTE — Anesthesia Procedure Notes (Signed)
Procedure Name: Intubation Date/Time: 12/16/2015 3:19 PM Performed by: Cynda Familia Pre-anesthesia Checklist: Patient identified, Emergency Drugs available, Suction available and Patient being monitored Patient Re-evaluated:Patient Re-evaluated prior to inductionOxygen Delivery Method: Circle System Utilized Preoxygenation: Pre-oxygenation with 100% oxygen Intubation Type: IV induction Ventilation: Mask ventilation without difficulty Laryngoscope Size: Miller and 2 Grade View: Grade I Tube type: Oral Number of attempts: 1 Airway Equipment and Method: Stylet Placement Confirmation: ETT inserted through vocal cords under direct vision,  positive ETCO2 and breath sounds checked- equal and bilateral Secured at: 22 cm Tube secured with: Tape Dental Injury: Teeth and Oropharynx as per pre-operative assessment  Comments: Smooth RSI induction by Marcell Barlow--- intubation AM CRNA atraumatic-- teeth and mouth as preop --  Irregular surfaces front teeth.

## 2015-12-16 NOTE — Op Note (Signed)
Appendectomy, Lap, Procedure Note  Indications: The patient presented with a history of right-sided abdominal pain. A CT revealed findings consistent with acute appendicitis.  Pre-operative Diagnosis: acute appendicitis  Post-operative Diagnosis: Same  Surgeon: Coralie Keens A   Assistants: 0  Anesthesia: General endotracheal anesthesia  ASA Class: 3  Procedure Details  The patient was seen again in the Holding Room. The risks, benefits, complications, treatment options, and expected outcomes were discussed with the patient and/or family. The possibilities of reaction to medication, perforation of viscus, bleeding, recurrent infection, finding a normal appendix, the need for additional procedures, failure to diagnose a condition, and creating a complication requiring transfusion or operation were discussed. There was concurrence with the proposed plan and informed consent was obtained. The site of surgery was properly noted. The patient was taken to Operating Room, identified as Alastair Forshee and the procedure verified as Appendectomy. A Time Out was held and the above information confirmed.  The patient was placed in the supine position and general anesthesia was induced, along with placement of orogastric tube, Venodyne boots, and a Foley catheter. The abdomen was prepped and draped in a sterile fashion. A one centimeter infraumbilical incision was made.  The  midline fascia was incised with a #15 blade.  A Kelly clamp was used to confirm entrance into the peritoneal cavity.  A pursestring suture was passed around the incision with a 0 Vicryl.  The Hasson was introduced into the abdomen and the tails of the suture were used to hold the Hasson in place.   The pneumoperitoneum was then established to steady pressure of 15 mmHg.  Additional 5 mm cannulas then placed in the left lower quadrant of the abdomen and the right upper quadrant under direct visualization. A careful evaluation of the  entire abdomen was carried out. The patient was placed in Trendelenburg and left lateral decubitus position. The small intestines were retracted in the cephalad and left lateral direction away from the pelvis and right lower quadrant. The patient was found to have an enlarged and inflamed appendix with necrosis that was extending into the pelvis. There was no evidence of perforation.  The appendix was carefully dissected. The appendix was was skeletonized with the harmonic scalpel.   The appendix was divided at its base using an endo-GIA stapler. Minimal appendiceal stump was left in place. There was no evidence of bleeding, leakage, or complication after division of the appendix. Irrigation was also performed and irrigate suctioned from the abdomen as well.  The umbilical port site was closed with the purse string suture. There was no residual palpable fascial defect.  The trocar site skin wounds were closed with 4-0 Monocryl.  Instrument, sponge, and needle counts were correct at the conclusion of the case.   Findings: The appendix was found to be inflamed. There were signs of necrosis.  There was not perforation. There was not abscess formation.  Estimated Blood Loss:  Minimal         Drains:none         Complications:  None; patient tolerated the procedure well.         Disposition: PACU - hemodynamically stable.         Condition: unstable

## 2015-12-16 NOTE — ED Notes (Signed)
Pt provided with 240 mL ginger ale and graham crackers at this time.  Pt tolerating PO intake at this time.

## 2015-12-16 NOTE — Anesthesia Postprocedure Evaluation (Signed)
Anesthesia Post Note  Patient: Air traffic controller  Procedure(s) Performed: Procedure(s) (LRB): APPENDECTOMY LAPAROSCOPIC (N/A)  Patient location during evaluation: PACU Anesthesia Type: General Level of consciousness: awake and alert Pain management: pain level controlled Vital Signs Assessment: post-procedure vital signs reviewed and stable Respiratory status: spontaneous breathing, nonlabored ventilation, respiratory function stable and patient connected to nasal cannula oxygen Cardiovascular status: blood pressure returned to baseline and stable Postop Assessment: no signs of nausea or vomiting Anesthetic complications: no    Last Vitals:  Vitals:   12/16/15 1430 12/16/15 1626  BP:  (!) 171/97  Pulse: 80 (!) 105  Resp: 21 14  Temp:  36.8 C    Last Pain:  Vitals:   12/16/15 1626  TempSrc:   PainSc: Asleep                 Montez Hageman

## 2015-12-16 NOTE — ED Notes (Signed)
Bed: [id number] Expected date:  Expected time:  Means of arrival:  Comments: EMS--abdominal pain/constipation

## 2015-12-17 ENCOUNTER — Encounter (HOSPITAL_COMMUNITY): Payer: Self-pay | Admitting: Surgery

## 2015-12-17 LAB — GLUCOSE, CAPILLARY
GLUCOSE-CAPILLARY: 214 mg/dL — AB (ref 65–99)
GLUCOSE-CAPILLARY: 168 mg/dL — AB (ref 65–99)
Glucose-Capillary: 183 mg/dL — ABNORMAL HIGH (ref 65–99)
Glucose-Capillary: 184 mg/dL — ABNORMAL HIGH (ref 65–99)

## 2015-12-17 LAB — COMPREHENSIVE METABOLIC PANEL
ALBUMIN: 3.8 g/dL (ref 3.5–5.0)
ALK PHOS: 43 U/L (ref 38–126)
ALT: 13 U/L — ABNORMAL LOW (ref 17–63)
AST: 15 U/L (ref 15–41)
Anion gap: 6 (ref 5–15)
BILIRUBIN TOTAL: 1.2 mg/dL (ref 0.3–1.2)
BUN: 8 mg/dL (ref 6–20)
CALCIUM: 8.1 mg/dL — AB (ref 8.9–10.3)
CO2: 28 mmol/L (ref 22–32)
CREATININE: 1.22 mg/dL (ref 0.61–1.24)
Chloride: 101 mmol/L (ref 101–111)
GFR calc Af Amer: >60 mL/min (ref >60)
GLUCOSE: 206 mg/dL — AB (ref 65–99)
Potassium: 4.3 mmol/L (ref 3.5–5.1)
Sodium: 135 mmol/L (ref 135–145)
TOTAL PROTEIN: 6.5 g/dL (ref 6.5–8.1)

## 2015-12-17 LAB — CBC
HEMATOCRIT: 36.5 % — AB (ref 39.0–52.0)
HEMOGLOBIN: 11.7 g/dL — AB (ref 13.0–17.0)
MCH: 19.2 pg — ABNORMAL LOW (ref 26.0–34.0)
MCHC: 32.1 g/dL (ref 30.0–36.0)
MCV: 59.8 fL — AB (ref 78.0–100.0)
Platelets: 175 10*3/uL (ref 150–400)
RBC: 6.1 MIL/uL — ABNORMAL HIGH (ref 4.22–5.81)
RDW: 15.3 % (ref 11.5–15.5)
WBC: 17 10*3/uL — AB (ref 4.0–10.5)

## 2015-12-17 MED ORDER — ACETAMINOPHEN 325 MG PO TABS
650.0000 mg | ORAL_TABLET | Freq: Four times a day (QID) | ORAL | Status: DC | PRN
Start: 2015-12-17 — End: 2015-12-23
  Administered 2015-12-18 – 2015-12-20 (×5): 650 mg via ORAL
  Filled 2015-12-17 (×5): qty 2

## 2015-12-17 MED ORDER — HEPARIN SODIUM (PORCINE) 5000 UNIT/ML IJ SOLN
5000.0000 [IU] | Freq: Three times a day (TID) | INTRAMUSCULAR | Status: DC
  Administered 2015-12-17 – 2015-12-23 (×15): 5000 [IU] via SUBCUTANEOUS
  Filled 2015-12-17 (×15): qty 1

## 2015-12-17 MED ORDER — OXYCODONE-ACETAMINOPHEN 5-325 MG PO TABS
1.0000 | ORAL_TABLET | ORAL | Status: DC | PRN
  Administered 2015-12-17 – 2015-12-18 (×3): 2 via ORAL
  Administered 2015-12-18: 1 via ORAL
  Administered 2015-12-18 – 2015-12-20 (×8): 2 via ORAL
  Administered 2015-12-21 – 2015-12-22 (×4): 1 via ORAL
  Administered 2015-12-22: 2 via ORAL
  Administered 2015-12-22: 1 via ORAL
  Filled 2015-12-17 (×5): qty 2
  Filled 2015-12-17 (×2): qty 1
  Filled 2015-12-17 (×2): qty 2
  Filled 2015-12-17: qty 1
  Filled 2015-12-17 (×5): qty 2
  Filled 2015-12-17: qty 1
  Filled 2015-12-17: qty 2
  Filled 2015-12-17: qty 1
  Filled 2015-12-17 (×2): qty 2

## 2015-12-17 NOTE — Progress Notes (Signed)
Inpatient Diabetes Program Recommendations  AACE/ADA: New Consensus Statement on Inpatient Glycemic Control (2015)  Target Ranges:  Prepandial:   less than 140 mg/dL      Peak postprandial:   less than 180 mg/dL (1-2 hours)      Critically ill patients:  140 - 180 mg/dL   Lab Results  Component Value Date   GLUCAP 214 (H) 12/17/2015   HGBA1C 7.9 (H) 10/04/2015    Review of Glycemic Control  Diabetes history: DM2 Outpatient Diabetes medications: Lantus 20 units QD  Current orders for Inpatient glycemic control: Novolog moderate tidwc and hs + 4 units tidwc  Inpatient Diabetes Program Recommendations:    Consider adding 1/2 home Lantus dose - 10 units QHS  Will continue to follow. Thank you. Lorenda Peck, RD, LDN, CDE Inpatient Diabetes Coordinator [phone]

## 2015-12-17 NOTE — Progress Notes (Signed)
1 Day Post-Op  Subjective: Feels better this a.m. 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 Started diet this a.m., has not been out of bed.  Objective: Vital signs in last 24 hours: Temp:  [98.2 F (36.8 C)-100.5 F (38.1 C)] 99.2 F (37.3 C) (08/22 0603) Pulse Rate:  [70-109] 99 (08/22 0603) Resp:  [11-22] 18 (08/22 0603) BP: (109-179)/(70-100) 123/70 (08/22 0603) SpO2:  [96 %-100 %] 99 % (08/22 0603) Weight:  [93.9 kg (207 lb)] 93.9 kg (207 lb) (08/22 0118) Last BM Date: 12/15/15 (normal per patient) 120 PO Urine, none recorded TM 100.5 at 11 PM, BP better, still a little tachycardic BMP OK, borderline increase in creatinine WBC better Intake/Output from previous day: 08/21 0701 - 08/22 0700 In: 2455 [P.O.:120; I.V.:2235; IV Piggyback:100] Out: 25 [Blood:25] Intake/Output this shift: Total I/O In: -  Out: 150 [Urine:150]  General appearance: alert, cooperative and no distress Resp: clear to auscultation bilaterally GI: Soft, sore, sites look fine, bowel sounds hypoactive.  Lab Results:   Recent Labs  12/16/15 1018 12/17/15 0554  WBC 23.0* 17.0*  HGB 13.3 11.7*  HCT 40.8 36.5*  PLT 188 175    BMET  Recent Labs  12/16/15 1018 12/17/15 0554  NA 136 135  K 4.0 4.3  CL 99* 101  CO2 28 28  GLUCOSE 249* 206*  BUN 9 8  CREATININE 0.92 1.22  CALCIUM 9.4 8.1*   PT/INR No results for input(s): LABPROT, INR in the last 72 hours.   Recent Labs Lab 12/15/15 2202 12/16/15 1018 12/17/15 0554  AST 17 17 15   ALT 15* 17 13*  ALKPHOS 47 63 43  BILITOT 0.7 0.7 1.2  PROT 7.1 8.2* 6.5  ALBUMIN 4.3 5.0 3.8     Lipase     Component Value Date/Time   LIPASE 111 (H) 12/16/2015 1018     Studies/Results: Dg Abdomen 1 View  Result Date: 12/15/2015 CLINICAL DATA:  Right-sided abdominal pain.  Nausea and vomiting. EXAM: ABDOMEN - 1 VIEW COMPARISON:  CT pelvis 07/28/2011 FINDINGS: The bowel gas pattern  is normal. Small volume of colonic stool in the right colon. No radio-opaque calculi. Atherosclerosis is noted. Degenerative change of both hips. IMPRESSION: Normal bowel gas pattern. Electronically Signed   By: Jeb Levering M.D.   On: 12/15/2015 23:59   Ct Abdomen Pelvis W Contrast  Result Date: 12/16/2015 CLINICAL DATA:  Right lower quadrant pain for 2 days. EXAM: CT ABDOMEN AND PELVIS WITH CONTRAST TECHNIQUE: Multidetector CT imaging of the abdomen and pelvis was performed using the standard protocol following bolus administration of intravenous contrast. CONTRAST:  115mL ISOVUE-300 IOPAMIDOL (ISOVUE-300) INJECTION 61% COMPARISON:  Radiograph 12/15/2015 FINDINGS: Lower chest:  No acute findings. Hepatobiliary: No masses or other significant abnormality. Pancreas: No mass, inflammatory changes, or other significant abnormality. Spleen: Within normal limits in size and appearance. Adrenals/Urinary Tract: No masses identified. No evidence of hydronephrosis. Stomach/Bowel: No evidence of obstruction. The appendix is fluid-filled and dilated with indistinct borders. There is a 15 mm appendicolith at the appendiceal base. There is significant periappendiceal fat stranding with minimal amount of free fluid posterior to the inflamed appendix and within the right pericolic gutter. No definite evidence of abscess formation. Vascular/Lymphatic: No pathologically enlarged lymph nodes. No evidence of abdominal aortic aneurysm. Minimal atherosclerotic disease of the distal aorta and common iliac arteries. Reproductive: No mass or other significant abnormality. Other: None. Musculoskeletal:  No suspicious bone lesions identified. IMPRESSION: Acute appendicitis  with indistinct appearance of the appendiceal wall and small amount of retro appendiceal fluid. Micro rupture cannot be excluded. Frank abscess formation is not seen. Minimal atherosclerotic disease of the distal aorta and common iliac arteries. Electronically  Signed   By: Fidela Salisbury M.D.   On: 12/16/2015 13:08    Medications: . amLODipine  10 mg Oral Daily  . gabapentin  400 mg Oral TID  . insulin aspart  0-15 Units Subcutaneous TID WC  . insulin aspart  0-5 Units Subcutaneous QHS  . insulin aspart  4 Units Subcutaneous TID WC  . lisinopril  20 mg Oral Daily  . piperacillin-tazobactam (ZOSYN)  IV  3.375 g Intravenous Q8H   . 0.9 % NaCl with KCl 20 mEq / L 100 mL/hr at 12/17/15 0400   History of CVA on Plavix Diabetes type 2 on metformin Hypertension Tobacco use  Blind right eye  Assessment/Plan Acute appendicitis S/P laparoscopic appendectomy, 12/16/15, Dr. Coralie Keens FEN: Carb modified diet/IV fluids ID: Day 2 Zosyn DVT: Subcutaneous heparin this p.m./SCDs   Plan: Out of bed and mobilize, continue IV antibiotics another 24 hours. Recheck labs in a.m. aim for discharge tomorrow with a total of 10 days antibiotics Postop. I can restart his metformin tomorrow. We'll restart his Plavix tomorrow also.  LOS: 1 day    Frank Schneider 12/17/2015 [phone]

## 2015-12-18 ENCOUNTER — Inpatient Hospital Stay (HOSPITAL_COMMUNITY): Payer: Medicaid Other

## 2015-12-18 LAB — GLUCOSE, CAPILLARY
GLUCOSE-CAPILLARY: 154 mg/dL — AB (ref 65–99)
Glucose-Capillary: 212 mg/dL — ABNORMAL HIGH (ref 65–99)
Glucose-Capillary: 224 mg/dL — ABNORMAL HIGH (ref 65–99)
Glucose-Capillary: 251 mg/dL — ABNORMAL HIGH (ref 65–99)

## 2015-12-18 LAB — BASIC METABOLIC PANEL
ANION GAP: 9 (ref 5–15)
BUN: 11 mg/dL (ref 6–20)
CHLORIDE: 98 mmol/L — AB (ref 101–111)
CO2: 24 mmol/L (ref 22–32)
Calcium: 8 mg/dL — ABNORMAL LOW (ref 8.9–10.3)
Creatinine, Ser: 1.21 mg/dL (ref 0.61–1.24)
GFR calc Af Amer: >60 mL/min (ref >60)
Glucose, Bld: 221 mg/dL — ABNORMAL HIGH (ref 65–99)
POTASSIUM: 4.4 mmol/L (ref 3.5–5.1)
SODIUM: 131 mmol/L — AB (ref 135–145)

## 2015-12-18 LAB — CBC
HCT: 33 % — ABNORMAL LOW (ref 39.0–52.0)
HEMOGLOBIN: 11.1 g/dL — AB (ref 13.0–17.0)
MCH: 19.6 pg — AB (ref 26.0–34.0)
MCHC: 33.6 g/dL (ref 30.0–36.0)
MCV: 58.4 fL — AB (ref 78.0–100.0)
PLATELETS: 160 10*3/uL (ref 150–400)
RBC: 5.65 MIL/uL (ref 4.22–5.81)
RDW: 15.2 % (ref 11.5–15.5)
WBC: 16.9 10*3/uL — AB (ref 4.0–10.5)

## 2015-12-18 NOTE — Progress Notes (Signed)
2 Days Post-Op  Subjective: He is still very tender, tolerating diet and would like to go.  With fever and ongoing WBC elevation plan to continue Rx here.  Objective: Vital signs in last 24 hours: Temp:  [98.7 F (37.1 C)-101.7 F (38.7 C)] 98.7 F (37.1 C) (08/23 0559) Pulse Rate:  [97-114] 112 (08/23 0507) Resp:  [18-20] 20 (08/23 0507) BP: (118-142)/(61-80) 118/61 (08/23 0507) SpO2:  [93 %-97 %] 94 % (08/23 0507) Last BM Date: 12/17/15 750 PO Urine 750 TM 101.7, VSS Na down, WBC is still up, creatinine is stable  Intake/Output from previous day: 08/22 0701 - 08/23 0700 In: 2305 [P.O.:750; I.V.:1405; IV Piggyback:150] Out: 750 [Urine:750] Intake/Output this shift: No intake/output data recorded.  General appearance: alert, cooperative and no distress Resp: clear to auscultation bilaterally GI: Soft, still very tender, sites OK tolerating diet.  Lab Results:   Recent Labs  12/17/15 0554 12/18/15 0603  WBC 17.0* 16.9*  HGB 11.7* 11.1*  HCT 36.5* 33.0*  PLT 175 160    BMET  Recent Labs  12/17/15 0554 12/18/15 0603  NA 135 131*  K 4.3 4.4  CL 101 98*  CO2 28 24  GLUCOSE 206* 221*  BUN 8 11  CREATININE 1.22 1.21  CALCIUM 8.1* 8.0*   PT/INR No results for input(s): LABPROT, INR in the last 72 hours.   Recent Labs Lab 12/15/15 2202 12/16/15 1018 12/17/15 0554  AST 17 17 15   ALT 15* 17 13*  ALKPHOS 47 63 43  BILITOT 0.7 0.7 1.2  PROT 7.1 8.2* 6.5  ALBUMIN 4.3 5.0 3.8     Lipase     Component Value Date/Time   LIPASE 111 (H) 12/16/2015 1018     Studies/Results: Ct Abdomen Pelvis W Contrast  Result Date: 12/16/2015 CLINICAL DATA:  Right lower quadrant pain for 2 days. EXAM: CT ABDOMEN AND PELVIS WITH CONTRAST TECHNIQUE: Multidetector CT imaging of the abdomen and pelvis was performed using the standard protocol following bolus administration of intravenous contrast. CONTRAST:  131mL ISOVUE-300 IOPAMIDOL (ISOVUE-300) INJECTION 61% COMPARISON:   Radiograph 12/15/2015 FINDINGS: Lower chest:  No acute findings. Hepatobiliary: No masses or other significant abnormality. Pancreas: No mass, inflammatory changes, or other significant abnormality. Spleen: Within normal limits in size and appearance. Adrenals/Urinary Tract: No masses identified. No evidence of hydronephrosis. Stomach/Bowel: No evidence of obstruction. The appendix is fluid-filled and dilated with indistinct borders. There is a 15 mm appendicolith at the appendiceal base. There is significant periappendiceal fat stranding with minimal amount of free fluid posterior to the inflamed appendix and within the right pericolic gutter. No definite evidence of abscess formation. Vascular/Lymphatic: No pathologically enlarged lymph nodes. No evidence of abdominal aortic aneurysm. Minimal atherosclerotic disease of the distal aorta and common iliac arteries. Reproductive: No mass or other significant abnormality. Other: None. Musculoskeletal:  No suspicious bone lesions identified. IMPRESSION: Acute appendicitis with indistinct appearance of the appendiceal wall and small amount of retro appendiceal fluid. Micro rupture cannot be excluded. Frank abscess formation is not seen. Minimal atherosclerotic disease of the distal aorta and common iliac arteries. Electronically Signed   By: Fidela Salisbury M.D.   On: 12/16/2015 13:08    Medications: . amLODipine  10 mg Oral Daily  . gabapentin  400 mg Oral TID  . heparin subcutaneous  5,000 Units Subcutaneous Q8H  . insulin aspart  0-15 Units Subcutaneous TID WC  . insulin aspart  0-5 Units Subcutaneous QHS  . insulin aspart  4 Units Subcutaneous TID WC  .  lisinopril  20 mg Oral Daily  . piperacillin-tazobactam (ZOSYN)  IV  3.375 g Intravenous Q8H   . 0.9 % NaCl with KCl 20 mEq / L 100 mL/hr at 12/18/15 [id number]   History of CVA on Plavix Diabetes type 2 on metformin Hypertension Tobacco use  Blind right eye Assessment/Plan Acute appendicitis S/P  laparoscopic appendectomy, 12/16/15, Dr. Coralie Keens  POD 2 Leukocytosis and fever Hyponatremia  - continue IV fluids (NS) FEN: Carb modified diet/IV fluids ID: Day 3 Zosyn DVT: Subcutaneous heparin this p.m./SCDs   Plan:  Continue IV Zosyn, recheck labs AM.      LOS: 2 days    Rayhana Slider 12/18/2015 [phone]

## 2015-12-19 LAB — CBC
HEMATOCRIT: 30.8 % — AB (ref 39.0–52.0)
HEMOGLOBIN: 10.3 g/dL — AB (ref 13.0–17.0)
MCH: 19.6 pg — ABNORMAL LOW (ref 26.0–34.0)
MCHC: 33.4 g/dL (ref 30.0–36.0)
MCV: 58.6 fL — ABNORMAL LOW (ref 78.0–100.0)
Platelets: 161 10*3/uL (ref 150–400)
RBC: 5.26 MIL/uL (ref 4.22–5.81)
RDW: 15.1 % (ref 11.5–15.5)
WBC: 15.8 10*3/uL — ABNORMAL HIGH (ref 4.0–10.5)

## 2015-12-19 LAB — BASIC METABOLIC PANEL
Anion gap: 9 (ref 5–15)
BUN: 9 mg/dL (ref 6–20)
CHLORIDE: 99 mmol/L — AB (ref 101–111)
CO2: 24 mmol/L (ref 22–32)
CREATININE: 1.21 mg/dL (ref 0.61–1.24)
Calcium: 8 mg/dL — ABNORMAL LOW (ref 8.9–10.3)
GFR calc non Af Amer: >60 mL/min (ref >60)
Glucose, Bld: 279 mg/dL — ABNORMAL HIGH (ref 65–99)
POTASSIUM: 5 mmol/L (ref 3.5–5.1)
Sodium: 132 mmol/L — ABNORMAL LOW (ref 135–145)

## 2015-12-19 LAB — GLUCOSE, CAPILLARY
GLUCOSE-CAPILLARY: 227 mg/dL — AB (ref 65–99)
GLUCOSE-CAPILLARY: 265 mg/dL — AB (ref 65–99)
Glucose-Capillary: 241 mg/dL — ABNORMAL HIGH (ref 65–99)
Glucose-Capillary: 228 mg/dL — ABNORMAL HIGH (ref 65–99)

## 2015-12-19 MED ORDER — CLOPIDOGREL BISULFATE 75 MG PO TABS
75.0000 mg | ORAL_TABLET | Freq: Every day | ORAL | Status: DC
  Administered 2015-12-19 – 2015-12-23 (×5): 75 mg via ORAL
  Filled 2015-12-19 (×5): qty 1

## 2015-12-19 MED ORDER — INSULIN GLARGINE 100 UNIT/ML ~~LOC~~ SOLN
20.0000 [IU] | Freq: Every day | SUBCUTANEOUS | Status: DC
  Administered 2015-12-19 – 2015-12-21 (×3): 20 [IU] via SUBCUTANEOUS
  Filled 2015-12-19 (×5): qty 0.2

## 2015-12-19 MED ORDER — INSULIN GLARGINE 100 UNIT/ML ~~LOC~~ SOLN
10.0000 [IU] | Freq: Two times a day (BID) | SUBCUTANEOUS | Status: DC
  Filled 2015-12-19: qty 0.1

## 2015-12-19 MED ORDER — INSULIN ASPART 100 UNIT/ML ~~LOC~~ SOLN
0.0000 [IU] | Freq: Three times a day (TID) | SUBCUTANEOUS | Status: DC
  Administered 2015-12-20: 11 [IU] via SUBCUTANEOUS
  Administered 2015-12-20: 7 [IU] via SUBCUTANEOUS
  Administered 2015-12-20: 11 [IU] via SUBCUTANEOUS
  Administered 2015-12-21: 4 [IU] via SUBCUTANEOUS
  Administered 2015-12-21 (×2): 7 [IU] via SUBCUTANEOUS
  Administered 2015-12-22 (×3): 4 [IU] via SUBCUTANEOUS
  Administered 2015-12-23: 7 [IU] via SUBCUTANEOUS

## 2015-12-19 MED ORDER — INSULIN ASPART 100 UNIT/ML ~~LOC~~ SOLN: 0.0000 [IU] | Freq: Every day | SUBCUTANEOUS | Status: DC

## 2015-12-19 NOTE — Progress Notes (Signed)
Pharmacy Antibiotic Note  Frank Schneider is a 40 y.o. male admitted on 12/16/2015 with acute appendicitis s/p lap appy on 8/21. Pharmacy has been consulted for Zosyn dosing.    Today, 12/19/2015: - Day #4 antibiotics - Tm 101.9, now afeb - WBC elevated but trending down - SCr stable  Plan: Zosyn 3.375g IV Q8H infused over 4hrs.  Follow up renal fxn, clinical course, and de-escalating antibiotics.   Height: 5\' 10"  (177.8 cm) Weight: 207 lb (93.9 kg) IBW/kg (Calculated) : 73  Temp (24hrs), Avg:99.8 F (37.7 C), Min:98.6 F (37 C), Max:101.9 F (38.8 C)   Recent Labs Lab 12/15/15 2202 12/16/15 1018 12/17/15 0554 12/18/15 0603 12/19/15 0603  WBC 14.2* 23.0* 17.0* 16.9* 15.8*  CREATININE 1.16 0.92 1.22 1.21 1.21    Estimated Creatinine Clearance: 93.4 mL/min (by C-G formula based on SCr of 1.21 mg/dL).    No Known Allergies  Antimicrobials this admission: 8/21 Zosyn >>   Dose adjustments this admission: None  Microbiology results: None  Thank you for allowing pharmacy to be a part of this patient's care.  Gretta Arab PharmD, BCPS Pager [phone] 12/19/2015 10:13 AM

## 2015-12-19 NOTE — Progress Notes (Signed)
He spiked a fever again this afternoon.  Continuing antibiotics, blood cultures, he had a UA 8/21, and CXR, 12/18/15.  I have increased VS to q4h, labs in AM recheck urine tonight also.  He may need repeat CT if he continues to have fever.

## 2015-12-19 NOTE — Progress Notes (Signed)
Contacted Dr. Johney Maine, at request of diabetes coordinator, based on patient's current blood sugars.  Dr. Johney Maine, gave orders for resistant sliding scale, and to split home lantus dose, between morning and night.

## 2015-12-19 NOTE — Progress Notes (Signed)
3 Days Post-Op  Subjective: Upset and threatening to sign out AMA.  Told me he may sue [facility], I didn't ask why.  Says he thinks we are keeping information from him.  I spent time on the phone and again in the room with him this AM.  Explaining his Appendix and ongoing elevated WBC, concerns for abscess.  I also explained his glucose is up because he isn't back on home meds.  He isn't hungry yet but taking liquids well according to him.  Can't tell from I/O. Sites all look fine.  Objective: Vital signs in last 24 hours: Temp:  [98.6 F (37 C)-101.9 F (38.8 C)] 99 F (37.2 C) (08/24 0848) Pulse Rate:  [97-118] 97 (08/24 0848) Resp:  [16-20] 20 (08/24 0848) BP: (139-163)/(70-89) 163/89 (08/24 0848) SpO2:  [89 %-95 %] 95 % (08/24 0848) Last BM Date: 12/17/15 240 PO recorded Urine 600  IV not recorded Afebrile, TM 99.9 WBC is better, but still up 15.8 CXR yesterday shows:Low lung volumes with bibasilar atelectasis and/or infiltrates.   Free intraperitoneal air consistent with recent postoperative state. Distended loops of bowel, most likely adynamic ileus.  Intake/Output from previous day: 08/23 0701 - 08/24 0700 In: 240 [P.O.:240] Out: 600 [Urine:600] Intake/Output this shift: Total I/O In: -  Out: 275 [Urine:275]  General appearance: alert, cooperative and seems much less agitated after talking with him.   Resp: clear to auscultation bilaterally GI: large, a little distended, BS hypoactive.  He has had some flatus but not much, and no BM so far.  Lab Results:   Recent Labs  12/18/15 0603 12/19/15 0603  WBC 16.9* 15.8*  HGB 11.1* 10.3*  HCT 33.0* 30.8*  PLT 160 161    BMET  Recent Labs  12/18/15 0603 12/19/15 0603  NA 131* 132*  K 4.4 5.0  CL 98* 99*  CO2 24 24  GLUCOSE 221* 279*  BUN 11 9  CREATININE 1.21 1.21  CALCIUM 8.0* 8.0*   PT/INR No results for input(s): LABPROT, INR in the last 72 hours.   Recent Labs Lab 12/15/15 2202 12/16/15 1018  12/17/15 0554  AST 17 17 15   ALT 15* 17 13*  ALKPHOS 47 63 43  BILITOT 0.7 0.7 1.2  PROT 7.1 8.2* 6.5  ALBUMIN 4.3 5.0 3.8     Lipase     Component Value Date/Time   LIPASE 111 (H) 12/16/2015 1018     Studies/Results: Dg Chest 2 View  Result Date: 12/18/2015 CLINICAL DATA:  Abdominal pain.  Fever.  Postoperative state. EXAM: CHEST  2 VIEW COMPARISON:  05/09/2015. FINDINGS: Mediastinum hilar structures normal. Low lung volumes with mild bibasilar atelectasis and or infiltrates. No pleural effusion or pneumothorax. Heart size normal. Free intraperitoneal air is noted consistent recent surgery. Bowel distention noted suggesting adynamic ileus . IMPRESSION: 1. Low lung volumes with bibasilar atelectasis and/or infiltrates. 2. Free intraperitoneal air consistent with recent postoperative state. Distended loops of bowel, most likely adynamic ileus. Electronically Signed   By: Marcello Moores  Register   On: 12/18/2015 09:37   Prior to Admission medications   Medication Sig Start Date End Date Taking? Authorizing Provider  amLODipine (NORVASC) 10 MG tablet Take 1 tablet (10 mg total) by mouth daily. 10/04/15  Yes Dorena Dew, FNP  clopidogrel (PLAVIX) 75 MG tablet Take 1 tablet (75 mg total) by mouth daily. 10/04/15  Yes Dorena Dew, FNP  fenofibrate (TRICOR) 145 MG tablet Take 1 tablet (145 mg total) by mouth daily. 10/04/15  Yes Dorena Dew, FNP  ferrous sulfate 325 (65 FE) MG tablet TAKE 1 TABLET BY MOUTH DAILY WITH BREAKFAST 11/25/15  Yes Micheline Chapman, NP  gabapentin (NEURONTIN) 400 MG capsule Take 1 capsule (400 mg total) by mouth 3 (three) times daily. 10/04/15  Yes Dorena Dew, FNP  insulin glargine (LANTUS) 100 UNIT/ML injection Inject 0.2 mLs (20 Units total) into the skin daily. 06/12/15  Yes Tresa Garter, MD  lisinopril (PRINIVIL,ZESTRIL) 20 MG tablet Take 1 tablet (20 mg total) by mouth daily. 08/07/15  Yes Dorena Dew, FNP  Blood Glucose Monitoring Suppl (TRUE  METRIX AIR GLUCOSE METER) DEVI 1 each by Does not apply route 2 (two) times daily at 10 AM and 5 PM. 01/15/15   Dorena Dew, FNP  glucose blood (TRUE METRIX BLOOD GLUCOSE TEST) test strip Use as instructed 01/15/15   Dorena Dew, FNP  Insulin Syringe-Needle U-100 (INSULIN SYRINGE .5CC/30GX1/2") 30G X 1/2" 0.5 ML MISC 100 11/25/15   Micheline Chapman, NP  Lancets (FREESTYLE) lancets Use as instructed 01/03/15   Reyne Dumas, MD  ondansetron (ZOFRAN ODT) 4 MG disintegrating tablet Take 1 tablet (4 mg total) by mouth every 8 (eight) hours as needed for nausea or vomiting. 12/16/15   Merryl Hacker, MD    Medications: . amLODipine  10 mg Oral Daily  . gabapentin  400 mg Oral TID  . heparin subcutaneous  5,000 Units Subcutaneous Q8H  . insulin aspart  0-15 Units Subcutaneous TID WC  . insulin aspart  0-5 Units Subcutaneous QHS  . insulin aspart  4 Units Subcutaneous TID WC  . lisinopril  20 mg Oral Daily  . piperacillin-tazobactam (ZOSYN)  IV  3.375 g Intravenous Q8H   . 0.9 % NaCl with KCl 20 mEq / L 100 mL/hr at 12/19/15 [id number]   Assessment/Plan Acute appendicitis with necrosis S/P laparoscopic appendectomy, 12/16/15, Dr. Coralie Keens  POD 3 Leukocytosis and fever Hyponatremia  - continue IV fluids (NS) improving slowly Ileus ? FEN: Carb modified diet/IV fluids ID: Day 4 Zosyn DVT: Subcutaneous heparin this p.m./SCDs  Plan:  Continue antibiotics, decrease IV fluids, I have ask Diabetes coordinator to see and help with the glucose.  I will probably restart the Lantus at half dose, but I would like there input.  I have encouraged him to walk more also.    LOS: 3 days    Piper Albro 12/19/2015 [phone]

## 2015-12-19 NOTE — Progress Notes (Signed)
RN offered to walk patient at this time patient refused. Patient to call RN when he wakes up from his nap to ambulate in halls.

## 2015-12-19 NOTE — Progress Notes (Signed)
Inpatient Diabetes Program Recommendations  AACE/ADA: New Consensus Statement on Inpatient Glycemic Control (2015)  Target Ranges:  Prepandial:   less than 140 mg/dL      Peak postprandial:   less than 180 mg/dL (1-2 hours)      Critically ill patients:  140 - 180 mg/dL   Lab Results  Component Value Date   GLUCAP 265 (H) 12/19/2015   HGBA1C 7.9 (H) 10/04/2015   Spoke with Shanon Brow, RN, regarding pt's hyperglycemia. RN to page [facility] to request Lantus 20 units QHS, which is pt's home dose.  Will continue to follow. Thank you. Lorenda Peck, RD, LDN, CDE Inpatient Diabetes Coordinator [phone]

## 2015-12-19 NOTE — Progress Notes (Signed)
Inpatient Diabetes Program Recommendations  AACE/ADA: New Consensus Statement on Inpatient Glycemic Control (2015)  Target Ranges:  Prepandial:   less than 140 mg/dL      Peak postprandial:   less than 180 mg/dL (1-2 hours)      Critically ill patients:  140 - 180 mg/dL   Lab Results  Component Value Date   GLUCAP 265 (H) 12/19/2015   HGBA1C 7.9 (H) 10/04/2015    Review of Glycemic Control  See note from 8/23. Needs basal insulin.  Inpatient Diabetes Program Recommendations:    Add Lantus 20 units QHS.  Will continue to follow. Thank you. Lorenda Peck, RD, LDN, CDE Inpatient Diabetes Coordinator [phone]

## 2015-12-20 ENCOUNTER — Encounter (HOSPITAL_COMMUNITY): Payer: Self-pay | Admitting: Radiology

## 2015-12-20 ENCOUNTER — Inpatient Hospital Stay (HOSPITAL_COMMUNITY): Payer: Medicaid Other

## 2015-12-20 LAB — BASIC METABOLIC PANEL
ANION GAP: 10 (ref 5–15)
BUN: 9 mg/dL (ref 6–20)
CALCIUM: 8.3 mg/dL — AB (ref 8.9–10.3)
CO2: 25 mmol/L (ref 22–32)
CREATININE: 1.13 mg/dL (ref 0.61–1.24)
Chloride: 97 mmol/L — ABNORMAL LOW (ref 101–111)
GFR calc Af Amer: >60 mL/min (ref >60)
GLUCOSE: 301 mg/dL — AB (ref 65–99)
Potassium: 4.4 mmol/L (ref 3.5–5.1)
Sodium: 132 mmol/L — ABNORMAL LOW (ref 135–145)

## 2015-12-20 LAB — URINE MICROSCOPIC-ADD ON

## 2015-12-20 LAB — GLUCOSE, CAPILLARY
GLUCOSE-CAPILLARY: 285 mg/dL — AB (ref 65–99)
GLUCOSE-CAPILLARY: 255 mg/dL — AB (ref 65–99)
GLUCOSE-CAPILLARY: 223 mg/dL — AB (ref 65–99)
Glucose-Capillary: 187 mg/dL — ABNORMAL HIGH (ref 65–99)

## 2015-12-20 LAB — CBC
HEMATOCRIT: 30.9 % — AB (ref 39.0–52.0)
Hemoglobin: 10.5 g/dL — ABNORMAL LOW (ref 13.0–17.0)
MCH: 19.5 pg — AB (ref 26.0–34.0)
MCHC: 34 g/dL (ref 30.0–36.0)
MCV: 57.3 fL — AB (ref 78.0–100.0)
PLATELETS: 225 10*3/uL (ref 150–400)
RBC: 5.39 MIL/uL (ref 4.22–5.81)
RDW: 15 % (ref 11.5–15.5)
WBC: 13.7 10*3/uL — AB (ref 4.0–10.5)

## 2015-12-20 LAB — URINALYSIS, ROUTINE W REFLEX MICROSCOPIC
LEUKOCYTES UA: NEGATIVE
Nitrite: NEGATIVE
PH: 5.5 (ref 5.0–8.0)
PROTEIN: 30 mg/dL — AB
Specific Gravity, Urine: 1.027 (ref 1.005–1.030)

## 2015-12-20 MED ORDER — DIATRIZOATE MEGLUMINE & SODIUM 66-10 % PO SOLN
30.0000 mL | Freq: Once | ORAL | Status: AC
  Administered 2015-12-20: 30 mL via ORAL
  Filled 2015-12-20: qty 30

## 2015-12-20 MED ORDER — IOPAMIDOL (ISOVUE-300) INJECTION 61%
100.0000 mL | Freq: Once | INTRAVENOUS | Status: AC | PRN
  Administered 2015-12-20: 100 mL via INTRAVENOUS

## 2015-12-20 MED ORDER — POLYETHYLENE GLYCOL 3350 17 G PO PACK
17.0000 g | PACK | Freq: Every day | ORAL | Status: DC
  Administered 2015-12-20 – 2015-12-23 (×4): 17 g via ORAL
  Filled 2015-12-20 (×4): qty 1

## 2015-12-20 NOTE — Consult Note (Signed)
Chief Complaint: Patient was seen in consultation today for CT-guided aspiration/possible drainage of right lower quadrant abdominal fluid collection/hematoma/abscess Chief Complaint  Patient presents with  . Abdominal Pain  . Constipation    Referring Physician(s): Blackman,D  Supervising Physician: Arne Cleveland  Patient Status: Inpatient  History of Present Illness: Frank Schneider is a 40 y.o. male with prior history of diabetes, hypertension, prior strokes (on plavix), most recently in January 2017, blindness in right eye and tobacco abuse who is status post laparoscopic appendectomy on 12/16/15 for acute appendicitis. Due to persistent fever, leukocytosis, abdominal discomfort/bloating a follow-up CT abdomen pelvis was obtained today revealed 3 intraperineal fluid collections, 2 in the right lower quadrant inferior to the appendectomy bed one located in the midline deep pelvis suspicious for infected collections. Request now received for CT guided aspiration/possible drainage of the largest right lower quadrant collection.  Past Medical History:  Diagnosis Date  . Blind right eye   . Diabetes mellitus    Takes Metformin  . Hypertension   . Priapism   . Stroke ([facility])   . Tobacco use   . Vision abnormalities     Past Surgical History:  Procedure Laterality Date  . INCISION AND DRAINAGE PERIRECTAL ABSCESS  07/28/2011   Procedure: IRRIGATION AND DEBRIDEMENT PERIRECTAL ABSCESS;  Surgeon: Adin Hector, MD;  Location: [facility];  Service: General;  Laterality: N/A;   of skin muscle and subcutaneous tissue of perimeum  8cmx12cm area   . LAPAROSCOPIC APPENDECTOMY N/A 12/16/2015   Procedure: APPENDECTOMY LAPAROSCOPIC;  Surgeon: Coralie Keens, MD;  Location: [facility];  Service: General;  Laterality: N/A;  . PENECTOMY      Allergies: Review of patient's allergies indicates no known allergies.  Medications: Prior to Admission medications   Medication Sig Start Date End  Date Taking? Authorizing Provider  amLODipine (NORVASC) 10 MG tablet Take 1 tablet (10 mg total) by mouth daily. 10/04/15  Yes Dorena Dew, FNP  clopidogrel (PLAVIX) 75 MG tablet Take 1 tablet (75 mg total) by mouth daily. 10/04/15  Yes Dorena Dew, FNP  fenofibrate (TRICOR) 145 MG tablet Take 1 tablet (145 mg total) by mouth daily. 10/04/15  Yes Dorena Dew, FNP  ferrous sulfate 325 (65 FE) MG tablet TAKE 1 TABLET BY MOUTH DAILY WITH BREAKFAST 11/25/15  Yes Micheline Chapman, NP  gabapentin (NEURONTIN) 400 MG capsule Take 1 capsule (400 mg total) by mouth 3 (three) times daily. 10/04/15  Yes Dorena Dew, FNP  insulin glargine (LANTUS) 100 UNIT/ML injection Inject 0.2 mLs (20 Units total) into the skin daily. 06/12/15  Yes Tresa Garter, MD  lisinopril (PRINIVIL,ZESTRIL) 20 MG tablet Take 1 tablet (20 mg total) by mouth daily. 08/07/15  Yes Dorena Dew, FNP  Blood Glucose Monitoring Suppl (TRUE METRIX AIR GLUCOSE METER) DEVI 1 each by Does not apply route 2 (two) times daily at 10 AM and 5 PM. 01/15/15   Dorena Dew, FNP  glucose blood (TRUE METRIX BLOOD GLUCOSE TEST) test strip Use as instructed 01/15/15   Dorena Dew, FNP  Insulin Syringe-Needle U-100 (INSULIN SYRINGE .5CC/30GX1/2") 30G X 1/2" 0.5 ML MISC 100 11/25/15   Micheline Chapman, NP  Lancets (FREESTYLE) lancets Use as instructed 01/03/15   Reyne Dumas, MD  ondansetron (ZOFRAN ODT) 4 MG disintegrating tablet Take 1 tablet (4 mg total) by mouth every 8 (eight) hours as needed for nausea or vomiting. 12/16/15   Merryl Hacker, MD  Family History  Problem Relation Age of Onset  . Diabetes Mother   . Hypertension Mother   . Diabetes Maternal Grandmother   . Hypertension Maternal Grandmother   . Depression Maternal Grandmother     Social History   Social History  . Marital status: Single    Spouse name: N/A  . Number of children: N/A  . Years of education: N/A   Social History Main Topics  .  Smoking status: Current Some Day Smoker    Packs/day: 0.50    Years: 8.00    Types: Cigarettes  . Smokeless tobacco: Never Used  . Alcohol use No  . Drug use: No     Comment: 3x wk  . Sexual activity: Not Asked   Other Topics Concern  . None   Social History Narrative  . None      Review of Systems see above; currently denies chest pain, worsening dyspnea, cough, back pain, nausea, vomiting or abnormal bleeding. Has had difficulty moving bowels due to pain. Also has intermittent paresthesias of left upper and lower extremity  Vital Signs: BP 137/82 (BP Location: Right Arm)   Pulse (!) 107   Temp 98.4 F (36.9 C) (Oral)   Resp 18   Ht 5\' 10"  (1.778 m)   Wt 207 lb (93.9 kg)   SpO2 94%   BMI 29.70 kg/m   Physical Exam awake, alert. Chest with slightly diminished breath sounds bases. Heart with a tachycardic but regular rhythm. Abdomen slightly distended, few bowel sounds, mildly tender right lower quadrant and mid pelvic regions; lower extremities with no edema  Mallampati Score:     Imaging: Dg Chest 2 View  Result Date: 12/18/2015 CLINICAL DATA:  Abdominal pain.  Fever.  Postoperative state. EXAM: CHEST  2 VIEW COMPARISON:  05/09/2015. FINDINGS: Mediastinum hilar structures normal. Low lung volumes with mild bibasilar atelectasis and or infiltrates. No pleural effusion or pneumothorax. Heart size normal. Free intraperitoneal air is noted consistent recent surgery. Bowel distention noted suggesting adynamic ileus . IMPRESSION: 1. Low lung volumes with bibasilar atelectasis and/or infiltrates. 2. Free intraperitoneal air consistent with recent postoperative state. Distended loops of bowel, most likely adynamic ileus. Electronically Signed   By: Marcello Moores  Register   On: 12/18/2015 09:37   Dg Abdomen 1 View  Result Date: 12/15/2015 CLINICAL DATA:  Right-sided abdominal pain.  Nausea and vomiting. EXAM: ABDOMEN - 1 VIEW COMPARISON:  CT pelvis 07/28/2011 FINDINGS: The bowel gas  pattern is normal. Small volume of colonic stool in the right colon. No radio-opaque calculi. Atherosclerosis is noted. Degenerative change of both hips. IMPRESSION: Normal bowel gas pattern. Electronically Signed   By: Jeb Levering M.D.   On: 12/15/2015 23:59   Ct Abdomen Pelvis W Contrast  Result Date: 12/20/2015 CLINICAL DATA:  Inpatient. Status post appendectomy 4 days prior with persistent postoperative fever and leukocytosis. EXAM: CT ABDOMEN AND PELVIS WITH CONTRAST TECHNIQUE: Multidetector CT imaging of the abdomen and pelvis was performed using the standard protocol following bolus administration of intravenous contrast. CONTRAST:  143mL ISOVUE-300 IOPAMIDOL (ISOVUE-300) INJECTION 61% COMPARISON:  12/16/2015 CT abdomen/ pelvis. FINDINGS: Lower chest: New patchy consolidation and ground-glass opacity in the basilar right upper lobe, right middle lobe and right lower lobe. Separate segmental atelectasis in the medial lower lobe bases and lingula. Hepatobiliary: Normal liver with no liver mass. Normal gallbladder with no radiopaque cholelithiasis. No biliary ductal dilatation. Pancreas: Normal, with no mass or duct dilation. Spleen: Normal size. No mass. Adrenals/Urinary Tract: Normal adrenals. Normal  kidneys with no hydronephrosis and no renal mass. Relatively collapsed bladder with the suggestion of new diffuse bladder wall thickening. Stomach/Bowel: Mild gastric dilatation with fluid filling most of the stomach and no appreciable gastric wall thickening. There is diffuse mild dilatation of the small bowel with fluid levels throughout the small bowel. Status post appendectomy. There is a 6.6 x 2.9 cm fluid collection in the right lower quadrant inferior to the appendectomy bed (series 2/ image 75), which demonstrates a mildly thickened hyper enhancing wall. There is a smaller similar-appearing 3.3 x 1.9 cm anterior right lower quadrant fluid collection (series 2/ image 75). There is a 4.7 x 3.8 cm  fluid collection in the midline deep pelvis (series 2/image 80) with mildly thickened enhancing wall. Diffuse mild dilatation of the right and transverse colon with colonic fluid levels and no large bowel wall thickening. Vascular/Lymphatic: Atherosclerotic nonaneurysmal abdominal aorta. Patent portal, splenic, hepatic and renal veins. No pathologically enlarged lymph nodes in the abdomen or pelvis. Reproductive: Normal size prostate. Other: There is a small amount of free air in the anterior upper peritoneal cavity, within normal recent postoperative limits. There is scattered free air throughout the extraperitoneal space anteriorly in the bilateral pelvis and lower abdomen. There is subcutaneous emphysema in the left groin and paraumbilical ventral abdominal wall. There is fat stranding throughout the mesenteric, lower omental and pelvic peritoneal and extraperitoneal fat. Musculoskeletal: No aggressive appearing focal osseous lesions. Moderate thoracolumbar spondylosis. Symmetric gynecomastia. IMPRESSION: 1. Three intraperitoneal fluid collections demonstrating mildly thickened enhancing walls, two located in the right lower quadrant inferior to the appendectomy bed and one located in the midline deep pelvis, suspicious for infected collections. 2. CT findings demonstrate mild diffuse adynamic ileus of the small and large bowel. 3. New mild patchy consolidation and ground-glass opacity at the right lung base, which could indicate aspiration or pneumonia. Superimposed mild-to-moderate bibasilar atelectasis. 4. Suggestion of mild diffuse bladder wall thickening, probably reactive. 5. Aortic atherosclerosis. Electronically Signed   By: Ilona Sorrel M.D.   On: 12/20/2015 11:28   Ct Abdomen Pelvis W Contrast  Result Date: 12/16/2015 CLINICAL DATA:  Right lower quadrant pain for 2 days. EXAM: CT ABDOMEN AND PELVIS WITH CONTRAST TECHNIQUE: Multidetector CT imaging of the abdomen and pelvis was performed using the  standard protocol following bolus administration of intravenous contrast. CONTRAST:  157mL ISOVUE-300 IOPAMIDOL (ISOVUE-300) INJECTION 61% COMPARISON:  Radiograph 12/15/2015 FINDINGS: Lower chest:  No acute findings. Hepatobiliary: No masses or other significant abnormality. Pancreas: No mass, inflammatory changes, or other significant abnormality. Spleen: Within normal limits in size and appearance. Adrenals/Urinary Tract: No masses identified. No evidence of hydronephrosis. Stomach/Bowel: No evidence of obstruction. The appendix is fluid-filled and dilated with indistinct borders. There is a 15 mm appendicolith at the appendiceal base. There is significant periappendiceal fat stranding with minimal amount of free fluid posterior to the inflamed appendix and within the right pericolic gutter. No definite evidence of abscess formation. Vascular/Lymphatic: No pathologically enlarged lymph nodes. No evidence of abdominal aortic aneurysm. Minimal atherosclerotic disease of the distal aorta and common iliac arteries. Reproductive: No mass or other significant abnormality. Other: None. Musculoskeletal:  No suspicious bone lesions identified. IMPRESSION: Acute appendicitis with indistinct appearance of the appendiceal wall and small amount of retro appendiceal fluid. Micro rupture cannot be excluded. Frank abscess formation is not seen. Minimal atherosclerotic disease of the distal aorta and common iliac arteries. Electronically Signed   By: Fidela Salisbury M.D.   On: 12/16/2015 13:08  Labs:  CBC:  Recent Labs  12/17/15 0554 12/18/15 0603 12/19/15 0603 12/20/15 0550  WBC 17.0* 16.9* 15.8* 13.7*  HGB 11.7* 11.1* 10.3* 10.5*  HCT 36.5* 33.0* 30.8* 30.9*  PLT 175 160 161 225    COAGS:  Recent Labs  12/31/14 2237 05/09/15 1704  INR 0.99 1.06  APTT 27 25    BMP:  Recent Labs  12/17/15 0554 12/18/15 0603 12/19/15 0603 12/20/15 0550  NA 135 131* 132* 132*  K 4.3 4.4 5.0 4.4  CL 101  98* 99* 97*  CO2 28 24 24 25   GLUCOSE 206* 221* 279* 301*  BUN 8 11 9 9   CALCIUM 8.1* 8.0* 8.0* 8.3*  CREATININE 1.22 1.21 1.21 1.13  GFRNONAA >60 >60 >60 >60  GFRAA >60 >60 >60 >60    LIVER FUNCTION TESTS:  Recent Labs  10/04/15 1454 12/15/15 2202 12/16/15 1018 12/17/15 0554  BILITOT 0.4 0.7 0.7 1.2  AST 12 17 17 15   ALT 13 15* 17 13*  ALKPHOS 58 47 63 43  PROT 6.8 7.1 8.2* 6.5  ALBUMIN 4.2 4.3 5.0 3.8    TUMOR MARKERS: No results for input(s): AFPTM, CEA, CA199, CHROMGRNA in the last 8760 hours.  Assessment and Plan: Patient status post laparoscopic appendectomy on 12/16/15 secondary to appendicitis; still with persistent abdominal discomfort/bloating/intermittent fevers, leukocytosis and finding of 3 intraperitoneal fluid collections concerning for abscess versus hematoma on CT today. Patient currently on Plavix secondary to history of strokes, most recently in January 2017. Request now received for CT guided aspiration/possible drainage of the largest right lower quadrant collection. Imaging studies reviewed by Dr. Kathlene Cote. Details/risks of procedure, including but not limited to, internal bleeding, infection, injury to adjacent structures, inability to drain collection, need for additional surgery discussed with patient with his understanding and consent. Procedure tent planned for 8/26 AM.   Thank you for this interesting consult.  I greatly enjoyed meeting Frank Schneider and look forward to participating in their care.  A copy of this report was sent to the requesting provider on this date.  Electronically Signed: D. Rowe Robert 12/20/2015, 5:13 PM   I spent a total of 30 minutes in face to face in clinical consultation, greater than 50% of which was counseling/coordinating care for CT-guided aspiration/possible drainage of right abdominal fluid collection

## 2015-12-20 NOTE — Progress Notes (Signed)
Inpatient Diabetes Program Recommendations  AACE/ADA: New Consensus Statement on Inpatient Glycemic Control (2015)  Target Ranges:  Prepandial:   less than 140 mg/dL      Peak postprandial:   less than 180 mg/dL (1-2 hours)      Critically ill patients:  140 - 180 mg/dL   Results for Frank Schneider, Frank Schneider (MRN [id number]) as of 12/20/2015 14:14  Ref. Range 12/19/2015 07:23 12/19/2015 11:34 12/19/2015 17:25 12/19/2015 20:50  Glucose-Capillary Latest Ref Range: 65 - 99 mg/dL 241 (H) 227 (H) 265 (H) 228 (H)   Results for Frank Schneider, Frank Schneider (MRN [id number]) as of 12/20/2015 14:14  Ref. Range 12/20/2015 07:30 12/20/2015 11:50  Glucose-Capillary Latest Ref Range: 65 - 99 mg/dL 285 (H) 255 (H)    Admit with: Acute Appendicitis with Necrosis  History: DM  Home DM Meds: Lantus 20 units daily  Current Insulin Orders: Lantus 20 units QHS (give 1/2 dose of CBG <150 mg/dl, Hold dose if CBG <110 mg/dl)      Novolog Resistant Correction Scale/ SSI (0-20 units) TID AC + HS      Novolog 4 units tidwc      -Per MD notes, patient now with Intra-abdominal abscesses post-op.  -Glucose levels >250 mg/dl likely due to abscess.     MD- Please consider the following in-hospital insulin adjustments:  1. Increase Lantus to 25 units QHS (20% increase)  2. Increase Novolog Meal Coverage to: Novolog 6 units tid with meals (hold if pt eats <50% of meal)      --Will follow patient during hospitalization--  Wyn Quaker RN, MSN, CDE Diabetes Coordinator Inpatient Glycemic Control Team Team Pager: [phone] (8a-5p)

## 2015-12-20 NOTE — Progress Notes (Signed)
Patient ID: Frank Schneider, male   DOB: 09/05/1975, 40 y.o.   MRN: [id number]  [facility] Surgery Progress Note  4 Days Post-Op  Subjective: Feels like he is doing worse. Continues to have abdominal pain, fevers, and elevated WBC's. Denies n/v. Has not had a BM or passed any flatus. Ambulating hall when I arrived.  Objective: Vital signs in last 24 hours: Temp:  [97.8 F (36.6 C)-102.6 F (39.2 C)] 97.8 F (36.6 C) (08/25 0757) Pulse Rate:  [108-120] 120 (08/25 0757) Resp:  [17-20] 17 (08/25 0757) BP: (159-170)/(78-96) 170/96 (08/25 0757) SpO2:  [92 %-100 %] 100 % (08/25 [id number]) Last BM Date: 12/17/15  Intake/Output from previous day: 08/24 0701 - 08/25 0700 In: 600 [P.O.:300; IV Piggyback:300] Out: 1225 [Urine:1225] Intake/Output this shift: Total I/O In: 120 [P.O.:120] Out: 250 [Urine:250]  PE: General appearance: alert, cooperative, NAD Cardio: tachycardic, regular rhythm Resp: clear to auscultation bilaterally GI: large, distended, BS present but hypoactive  Lab Results:   Recent Labs  12/19/15 0603 12/20/15 0550  WBC 15.8* 13.7*  HGB 10.3* 10.5*  HCT 30.8* 30.9*  PLT 161 225   BMET  Recent Labs  12/19/15 0603 12/20/15 0550  NA 132* 132*  K 5.0 4.4  CL 99* 97*  CO2 24 25  GLUCOSE 279* 301*  BUN 9 9  CREATININE 1.21 1.13  CALCIUM 8.0* 8.3*   PT/INR No results for input(s): LABPROT, INR in the last 72 hours. CMP     Component Value Date/Time   NA 132 (L) 12/20/2015 0550   K 4.4 12/20/2015 0550   CL 97 (L) 12/20/2015 0550   CO2 25 12/20/2015 0550   GLUCOSE 301 (H) 12/20/2015 0550   BUN 9 12/20/2015 0550   CREATININE 1.13 12/20/2015 0550   CREATININE 1.00 10/04/2015 1454   CALCIUM 8.3 (L) 12/20/2015 0550   PROT 6.5 12/17/2015 0554   ALBUMIN 3.8 12/17/2015 0554   AST 15 12/17/2015 0554   ALT 13 (L) 12/17/2015 0554   ALKPHOS 43 12/17/2015 0554   BILITOT 1.2 12/17/2015 0554   GFRNONAA >60 12/20/2015 0550   GFRNONAA >89 10/04/2015 1454    GFRAA >60 12/20/2015 0550   GFRAA >89 10/04/2015 1454   Lipase     Component Value Date/Time   LIPASE 111 (H) 12/16/2015 1018       Studies/Results: No results found.  Anti-infectives: Anti-infectives    Start     Dose/Rate Route Frequency Ordered Stop   12/16/15 2000  piperacillin-tazobactam (ZOSYN) IVPB 3.375 g     3.375 g 12.5 mL/hr over 240 Minutes Intravenous Every 8 hours 12/16/15 1458     12/16/15 1400  piperacillin-tazobactam (ZOSYN) IVPB 3.375 g     3.375 g 100 mL/hr over 30 Minutes Intravenous  Once 12/16/15 1354 12/16/15 1437   12/16/15 1330  cefTRIAXone (ROCEPHIN) 2 g in dextrose 5 % 50 mL IVPB  Status:  Discontinued     2 g 100 mL/hr over 30 Minutes Intravenous  Once 12/16/15 1321 12/16/15 1354   12/16/15 1330  metroNIDAZOLE (FLAGYL) IVPB 500 mg  Status:  Discontinued     500 mg 100 mL/hr over 60 Minutes Intravenous  Once 12/16/15 1321 12/16/15 1354       Assessment/Plan Acute appendicitis with necrosis S/P laparoscopic appendectomy, 12/16/15, Dr. Coralie Keens POD 4 Leukocytosis and fever  - slightly decreased today 13.7 from 15.8.  - fever this a.m. 102.4, later improved to 97.8 - transport waiting to take patient to CT scan - pending urinalysis  and blood cultures Hyponatremia - continues to be slightly low at 132, continue IVF Ileus - last BM 12/17/15. No n/v. Continue ambulating and drinking fluids. Add miralax. DM - followed by diabetes coordinator. Added lantus 20u yesterday FEN: Carb modified diet, IVF ID: Day 5 Zosyn DVT: heparin, SCD's  Plan:  pending CT scan, urinalysis, and blood cultures. Continue antibiotics.     LOS: 4 days    Jerrye Beavers , [facility] Surgery 12/20/2015, 10:40 AM Pager: [phone] Consults: [phone] Mon-Fri 7:00 am-4:30 pm Sat-Sun 7:00 am-11:30 am

## 2015-12-20 NOTE — Progress Notes (Signed)
Patient unhappy with care at this time, states he is going to leave, says he feels like he is only getting worse, paged PA for [facility], awaiting call back.

## 2015-12-21 ENCOUNTER — Inpatient Hospital Stay (HOSPITAL_COMMUNITY): Payer: Medicaid Other

## 2015-12-21 ENCOUNTER — Encounter (HOSPITAL_COMMUNITY): Payer: Self-pay | Admitting: Radiology

## 2015-12-21 LAB — CBC
HEMATOCRIT: 28.7 % — AB (ref 39.0–52.0)
HEMOGLOBIN: 9.7 g/dL — AB (ref 13.0–17.0)
MCH: 19.3 pg — AB (ref 26.0–34.0)
MCHC: 33.8 g/dL (ref 30.0–36.0)
MCV: 57.2 fL — AB (ref 78.0–100.0)
Platelets: 268 10*3/uL (ref 150–400)
RBC: 5.02 MIL/uL (ref 4.22–5.81)
RDW: 15.1 % (ref 11.5–15.5)
WBC: 15.1 10*3/uL — ABNORMAL HIGH (ref 4.0–10.5)

## 2015-12-21 LAB — PROTIME-INR
INR: 1.08
Prothrombin Time: 14 seconds (ref 11.4–15.2)

## 2015-12-21 LAB — GLUCOSE, CAPILLARY
GLUCOSE-CAPILLARY: 227 mg/dL — AB (ref 65–99)
GLUCOSE-CAPILLARY: 238 mg/dL — AB (ref 65–99)
GLUCOSE-CAPILLARY: 135 mg/dL — AB (ref 65–99)
Glucose-Capillary: 185 mg/dL — ABNORMAL HIGH (ref 65–99)

## 2015-12-21 LAB — HEMOGLOBIN A1C
Hgb A1c MFr Bld: 8.4 % — ABNORMAL HIGH (ref 4.8–5.6)
MEAN PLASMA GLUCOSE: 194 mg/dL

## 2015-12-21 LAB — BASIC METABOLIC PANEL
Anion gap: 9 (ref 5–15)
BUN: 17 mg/dL (ref 6–20)
CALCIUM: 8.3 mg/dL — AB (ref 8.9–10.3)
CHLORIDE: 97 mmol/L — AB (ref 101–111)
CO2: 26 mmol/L (ref 22–32)
CREATININE: 1.47 mg/dL — AB (ref 0.61–1.24)
GFR calc non Af Amer: 58 mL/min — ABNORMAL LOW (ref >60)
GLUCOSE: 250 mg/dL — AB (ref 65–99)
Potassium: 3.8 mmol/L (ref 3.5–5.1)
Sodium: 132 mmol/L — ABNORMAL LOW (ref 135–145)

## 2015-12-21 MED ORDER — FENTANYL CITRATE (PF) 100 MCG/2ML IJ SOLN
INTRAMUSCULAR | Status: AC | PRN
  Administered 2015-12-21 (×2): 50 ug via INTRAVENOUS

## 2015-12-21 MED ORDER — MIDAZOLAM HCL 2 MG/2ML IJ SOLN
INTRAMUSCULAR | Status: AC | PRN
  Administered 2015-12-21: 1 mg via INTRAVENOUS

## 2015-12-21 MED ORDER — MIDAZOLAM HCL 2 MG/2ML IJ SOLN
INTRAMUSCULAR | Status: AC
  Filled 2015-12-21: qty 6

## 2015-12-21 MED ORDER — FENTANYL CITRATE (PF) 100 MCG/2ML IJ SOLN
INTRAMUSCULAR | Status: AC
  Filled 2015-12-21: qty 4

## 2015-12-21 NOTE — Procedures (Signed)
S/p RLQ 12 FR ABSCESS DRAIN WITH CT GUIDANCE  No comp Stable Full report in PACS BLOODY EXUDATIVE FLD ASPIRATED AND SENT FOR CX

## 2015-12-22 LAB — GLUCOSE, CAPILLARY
GLUCOSE-CAPILLARY: 189 mg/dL — AB (ref 65–99)
GLUCOSE-CAPILLARY: 129 mg/dL — AB (ref 65–99)
Glucose-Capillary: 185 mg/dL — ABNORMAL HIGH (ref 65–99)
Glucose-Capillary: 153 mg/dL — ABNORMAL HIGH (ref 65–99)

## 2015-12-22 NOTE — Progress Notes (Signed)
Supervising Physician: Arne Cleveland  Patient Status:  Inpatient   Subjective: S/p lap appy 8/21 Post op abscess S/p perc drain 8/26 Feeling a bit better Tolerating reg diet  Allergies: Review of patient's allergies indicates no known allergies.  Medications:  Current Facility-Administered Medications:  .  0.9 % NaCl with KCl 20 mEq/ L  infusion, , Intravenous, Continuous, Earnstine Regal, PA-C, Last Rate: 50 mL/hr at 12/21/15 1139 .  acetaminophen (TYLENOL) tablet 650 mg, 650 mg, Oral, Q6H PRN, Earnstine Regal, PA-C, 650 mg at 12/20/15 1240 .  amLODipine (NORVASC) tablet 10 mg, 10 mg, Oral, Daily, Coralie Keens, MD, 10 mg at 12/21/15 1312 .  clopidogrel (PLAVIX) tablet 75 mg, 75 mg, Oral, Daily, Earnstine Regal, PA-C, 75 mg at 12/21/15 1314 .  diphenhydrAMINE (BENADRYL) capsule 25 mg, 25 mg, Oral, Q6H PRN **OR** diphenhydrAMINE (BENADRYL) injection 25 mg, 25 mg, Intravenous, Q6H PRN, Earnstine Regal, PA-C .  gabapentin (NEURONTIN) capsule 400 mg, 400 mg, Oral, TID, Coralie Keens, MD, 400 mg at 12/21/15 2126 .  heparin injection 5,000 Units, 5,000 Units, Subcutaneous, Q8H, Earnstine Regal, PA-C, 5,000 Units at 12/22/15 [phone] .  insulin aspart (novoLOG) injection 0-20 Units, 0-20 Units, Subcutaneous, TID WC, Michael Boston, MD, 4 Units at 12/22/15 [phone] .  insulin aspart (novoLOG) injection 0-5 Units, 0-5 Units, Subcutaneous, QHS, Coralie Keens, MD, 2 Units at 12/19/15 2148 .  insulin aspart (novoLOG) injection 4 Units, 4 Units, Subcutaneous, TID WC, Coralie Keens, MD, 4 Units at 12/22/15 0857 .  insulin glargine (LANTUS) injection 20 Units, 20 Units, Subcutaneous, QHS, Michael Boston, MD, 20 Units at 12/21/15 2231 .  lisinopril (PRINIVIL,ZESTRIL) tablet 20 mg, 20 mg, Oral, Daily, Coralie Keens, MD, 20 mg at 12/21/15 1313 .  morphine 2 MG/ML injection 1-4 mg, 1-4 mg, Intravenous, Q1H PRN, Coralie Keens, MD, 2 mg at 12/20/15 2229 .  ondansetron (ZOFRAN-ODT)  disintegrating tablet 4 mg, 4 mg, Oral, Q6H PRN **OR** ondansetron (ZOFRAN) injection 4 mg, 4 mg, Intravenous, Q6H PRN, Earnstine Regal, PA-C .  oxyCODONE-acetaminophen (PERCOCET/ROXICET) 5-325 MG per tablet 1-2 tablet, 1-2 tablet, Oral, Q4H PRN, Earnstine Regal, PA-C, 1 tablet at 12/22/15 [phone] .  piperacillin-tazobactam (ZOSYN) IVPB 3.375 g, 3.375 g, Intravenous, Q8H, Earnstine Regal, PA-C, 3.375 g at 12/22/15 [id number] .  polyethylene glycol (MIRALAX / GLYCOLAX) packet 17 g, 17 g, Oral, Daily, Brooke A Miller, PA-C, 17 g at 12/21/15 1312    Vital Signs: BP (!) 159/83 (BP Location: Left Arm)   Pulse 93   Temp 98.6 F (37 C) (Oral)   Resp 18   Ht 5\' 10"  (1.778 m)   Wt 207 lb (93.9 kg)   SpO2 97%   BMI 29.70 kg/m   Physical Exam Abd: soft, mildly tender RLQ Drain intact, site clean, output serosanguinous  Imaging:  Ct Image Guided Drainage By Percutaneous Catheter  Result Date: 12/21/2015 INDICATION: Right lower quadrant postop abscess EXAM: CT GUIDED DRAINAGE OF RIGHT LOWER QUADRANT ABSCESS MEDICATIONS: The patient is currently admitted to the hospital and receiving intravenous antibiotics. The antibiotics were administered within an appropriate time frame prior to the initiation of the procedure. ANESTHESIA/SEDATION: 1.0 mg IV Versed 50 mcg IV Fentanyl Moderate Sedation Time:  13 MINUTES The patient was continuously monitored during the procedure by the interventional radiology nurse under my direct supervision. COMPLICATIONS: None immediate. TECHNIQUE: Informed written consent was obtained from the patient after a thorough discussion of the procedural risks, benefits and alternatives. All questions were addressed. Maximal Sterile Barrier Technique was  utilized including caps, mask, sterile gowns, sterile gloves, sterile drape, hand hygiene and skin antiseptic. A timeout was performed prior to the initiation of the procedure. PROCEDURE: Previous imaging reviewed. Patient positioned supine.  Noncontrast localization CT performed. The right lower quadrant dominant abscess was localized. Overlying skin was marked. The right lower quadrant was prepped with ChloraPrep in a sterile fashion, and a sterile drape was applied covering the operative field. A sterile gown and sterile gloves were used for the procedure. Local anesthesia was provided with 1% Lidocaine. Under sterile conditions and local anesthesia, CT guidance was utilized to advance an 18 gauge 10 cm access needle from a lateral oblique approach into the dominant abscess. Needle position confirmed with CT. There was return of bloody exudative fluid. Sample sent for Gram stain and culture. Guidewire inserted followed by tract dilatation to insert a 12 [place] drain. Retention loop formed in the abscess cavity. Syringe aspiration yielded 30 cc bloody exudative fluid. Catheter position confirmed with CT. Catheter was secured with Prolene suture and connected to external suction bulb. Sterile dressing applied. No immediate complication. Patient tolerated the procedure well. FINDINGS: Imaging confirms needle insertion into the dominant right lower quadrant abdominal abscess for drain insertion IMPRESSION: Successful CT-guided right lower quadrant abdominal abscess drain insertion. Electronically Signed   By: Jerilynn Mages.  Shick M.D.   On: 12/21/2015 11:37    Labs:  CBC:  Recent Labs  12/18/15 0603 12/19/15 0603 12/20/15 0550 12/21/15 0523  WBC 16.9* 15.8* 13.7* 15.1*  HGB 11.1* 10.3* 10.5* 9.7*  HCT 33.0* 30.8* 30.9* 28.7*  PLT 160 161 225 268    COAGS:  Recent Labs  12/31/14 2237 05/09/15 1704 12/21/15 0523  INR 0.99 1.06 1.08  APTT 27 25  --     BMP:  Recent Labs  12/18/15 0603 12/19/15 0603 12/20/15 0550 12/21/15 0523  NA 131* 132* 132* 132*  K 4.4 5.0 4.4 3.8  CL 98* 99* 97* 97*  CO2 24 24 25 26   GLUCOSE 221* 279* 301* 250*  BUN 11 9 9 17   CALCIUM 8.0* 8.0* 8.3* 8.3*  CREATININE 1.21 1.21 1.13 1.47*  GFRNONAA >60  >60 >60 58*  GFRAA >60 >60 >60 >60    LIVER FUNCTION TESTS:  Recent Labs  10/04/15 1454 12/15/15 2202 12/16/15 1018 12/17/15 0554  BILITOT 0.4 0.7 0.7 1.2  AST 12 17 17 15   ALT 13 15* 17 13*  ALKPHOS 58 47 63 43  PROT 6.8 7.1 8.2* 6.5  ALBUMIN 4.2 4.3 5.0 3.8    Assessment and Plan: S/p RLQ abscess drain---post lap appy Cont drain care Plans per [facility]  Electronically Signed: Ascencion Dike 12/22/2015, 9:22 AM   I spent a total of 15 Minutes at the the patient's bedside AND on the patient's hospital floor or unit, greater than 50% of which was counseling/coordinating care for RLQ drain

## 2015-12-22 NOTE — Progress Notes (Signed)
Pharmacy Antibiotic Note  Frank Schneider is a 40 y.o. male admitted on 12/16/2015 with acute appendicitis s/p lap appy on 8/21. Pharmacy has been consulted for Zosyn dosing.    Today, 12/22/2015: - Day #7 antibiotics - Afeb last 48 hours now - WBC elevated - SCr up some  Plan: Continue Zosyn 3.375g IV Q8H infused over 4hrs.  Follow up renal fxn, clinical course, and de-escalating antibiotics.   Height: 5\' 10"  (177.8 cm) Weight: 207 lb (93.9 kg) IBW/kg (Calculated) : 73  Temp (24hrs), Avg:98.9 F (37.2 C), Min:98.2 F (36.8 C), Max:99.9 F (37.7 C)   Recent Labs Lab 12/17/15 0554 12/18/15 0603 12/19/15 0603 12/20/15 0550 12/21/15 0523  WBC 17.0* 16.9* 15.8* 13.7* 15.1*  CREATININE 1.22 1.21 1.21 1.13 1.47*    Estimated Creatinine Clearance: 76.9 mL/min (by C-G formula based on SCr of 1.47 mg/dL).    No Known Allergies  Antimicrobials this admission: 8/21 Zosyn >>   Dose adjustments this admission: None  Microbiology results: None   Thank you for allowing pharmacy to be a part of this patient's care.   Adrian Saran, PharmD, BCPS Pager [phone] 12/22/2015 8:55 AM

## 2015-12-22 NOTE — Progress Notes (Signed)
Patient ID: Frank Schneider, male   DOB: [birth year], 40 y.o.   MRN: [id number] 6 Days Post-Op  Subjective: Feels a little better than prior to drain placement. Still "sore". Tolerating regular diet  And bowels moving.  Objective: Vital signs in last 24 hours: Temp:  [98.2 F (36.8 C)-99.9 F (37.7 C)] 98.6 F (37 C) (08/27 0516) Pulse Rate:  [93-103] 93 (08/27 0516) Resp:  [18-20] 18 (08/27 0516) BP: (134-163)/(71-85) 159/83 (08/27 0516) SpO2:  [96 %-99 %] 97 % (08/27 0516) Last BM Date: 12/21/15  Intake/Output from previous day: 08/26 0701 - 08/27 0700 In: 1608.3 [P.O.:590; I.V.:808.3; IV Piggyback:200] Out: 2312 [Urine:2275; Drains:37] Intake/Output this shift: Total I/O In: -  Out: 300 [Urine:300]  General appearance: alert, cooperative and no distress GI: mild right lower quadrant tenderness. Mild distention. Incision/Wound: clean and dry  Lab Results:   Recent Labs  12/20/15 0550 12/21/15 0523  WBC 13.7* 15.1*  HGB 10.5* 9.7*  HCT 30.9* 28.7*  PLT 225 268   BMET  Recent Labs  12/20/15 0550 12/21/15 0523  NA 132* 132*  K 4.4 3.8  CL 97* 97*  CO2 25 26  GLUCOSE 301* 250*  BUN 9 17  CREATININE 1.13 1.47*  CALCIUM 8.3* 8.3*     Studies/Results: Ct Abdomen Pelvis W Contrast  Result Date: 12/20/2015 CLINICAL DATA:  Inpatient. Status post appendectomy 4 days prior with persistent postoperative fever and leukocytosis. EXAM: CT ABDOMEN AND PELVIS WITH CONTRAST TECHNIQUE: Multidetector CT imaging of the abdomen and pelvis was performed using the standard protocol following bolus administration of intravenous contrast. CONTRAST:  164mL ISOVUE-300 IOPAMIDOL (ISOVUE-300) INJECTION 61% COMPARISON:  12/16/2015 CT abdomen/ pelvis. FINDINGS: Lower chest: New patchy consolidation and ground-glass opacity in the basilar right upper lobe, right middle lobe and right lower lobe. Separate segmental atelectasis in the medial lower lobe bases and lingula. Hepatobiliary:  Normal liver with no liver mass. Normal gallbladder with no radiopaque cholelithiasis. No biliary ductal dilatation. Pancreas: Normal, with no mass or duct dilation. Spleen: Normal size. No mass. Adrenals/Urinary Tract: Normal adrenals. Normal kidneys with no hydronephrosis and no renal mass. Relatively collapsed bladder with the suggestion of new diffuse bladder wall thickening. Stomach/Bowel: Mild gastric dilatation with fluid filling most of the stomach and no appreciable gastric wall thickening. There is diffuse mild dilatation of the small bowel with fluid levels throughout the small bowel. Status post appendectomy. There is a 6.6 x 2.9 cm fluid collection in the right lower quadrant inferior to the appendectomy bed (series 2/ image 75), which demonstrates a mildly thickened hyper enhancing wall. There is a smaller similar-appearing 3.3 x 1.9 cm anterior right lower quadrant fluid collection (series 2/ image 75). There is a 4.7 x 3.8 cm fluid collection in the midline deep pelvis (series 2/image 80) with mildly thickened enhancing wall. Diffuse mild dilatation of the right and transverse colon with colonic fluid levels and no large bowel wall thickening. Vascular/Lymphatic: Atherosclerotic nonaneurysmal abdominal aorta. Patent portal, splenic, hepatic and renal veins. No pathologically enlarged lymph nodes in the abdomen or pelvis. Reproductive: Normal size prostate. Other: There is a small amount of free air in the anterior upper peritoneal cavity, within normal recent postoperative limits. There is scattered free air throughout the extraperitoneal space anteriorly in the bilateral pelvis and lower abdomen. There is subcutaneous emphysema in the left groin and paraumbilical ventral abdominal wall. There is fat stranding throughout the mesenteric, lower omental and pelvic peritoneal and extraperitoneal fat. Musculoskeletal: No aggressive appearing focal osseous lesions.  Moderate thoracolumbar spondylosis.  Symmetric gynecomastia. IMPRESSION: 1. Three intraperitoneal fluid collections demonstrating mildly thickened enhancing walls, two located in the right lower quadrant inferior to the appendectomy bed and one located in the midline deep pelvis, suspicious for infected collections. 2. CT findings demonstrate mild diffuse adynamic ileus of the small and large bowel. 3. New mild patchy consolidation and ground-glass opacity at the right lung base, which could indicate aspiration or pneumonia. Superimposed mild-to-moderate bibasilar atelectasis. 4. Suggestion of mild diffuse bladder wall thickening, probably reactive. 5. Aortic atherosclerosis. Electronically Signed   By: Ilona Sorrel M.D.   On: 12/20/2015 11:28   Ct Image Guided Drainage By Percutaneous Catheter  Result Date: 12/21/2015 INDICATION: Right lower quadrant postop abscess EXAM: CT GUIDED DRAINAGE OF RIGHT LOWER QUADRANT ABSCESS MEDICATIONS: The patient is currently admitted to the hospital and receiving intravenous antibiotics. The antibiotics were administered within an appropriate time frame prior to the initiation of the procedure. ANESTHESIA/SEDATION: 1.0 mg IV Versed 50 mcg IV Fentanyl Moderate Sedation Time:  13 MINUTES The patient was continuously monitored during the procedure by the interventional radiology nurse under my direct supervision. COMPLICATIONS: None immediate. TECHNIQUE: Informed written consent was obtained from the patient after a thorough discussion of the procedural risks, benefits and alternatives. All questions were addressed. Maximal Sterile Barrier Technique was utilized including caps, mask, sterile gowns, sterile gloves, sterile drape, hand hygiene and skin antiseptic. A timeout was performed prior to the initiation of the procedure. PROCEDURE: Previous imaging reviewed. Patient positioned supine. Noncontrast localization CT performed. The right lower quadrant dominant abscess was localized. Overlying skin was marked. The  right lower quadrant was prepped with ChloraPrep in a sterile fashion, and a sterile drape was applied covering the operative field. A sterile gown and sterile gloves were used for the procedure. Local anesthesia was provided with 1% Lidocaine. Under sterile conditions and local anesthesia, CT guidance was utilized to advance an 18 gauge 10 cm access needle from a lateral oblique approach into the dominant abscess. Needle position confirmed with CT. There was return of bloody exudative fluid. Sample sent for Gram stain and culture. Guidewire inserted followed by tract dilatation to insert a 12 [place] drain. Retention loop formed in the abscess cavity. Syringe aspiration yielded 30 cc bloody exudative fluid. Catheter position confirmed with CT. Catheter was secured with Prolene suture and connected to external suction bulb. Sterile dressing applied. No immediate complication. Patient tolerated the procedure well. FINDINGS: Imaging confirms needle insertion into the dominant right lower quadrant abdominal abscess for drain insertion IMPRESSION: Successful CT-guided right lower quadrant abdominal abscess drain insertion. Electronically Signed   By: Jerilynn Mages.  Shick M.D.   On: 12/21/2015 11:37    Anti-infectives: Anti-infectives    Start     Dose/Rate Route Frequency Ordered Stop   12/16/15 2000  piperacillin-tazobactam (ZOSYN) IVPB 3.375 g     3.375 g 12.5 mL/hr over 240 Minutes Intravenous Every 8 hours 12/16/15 1458     12/16/15 1400  piperacillin-tazobactam (ZOSYN) IVPB 3.375 g     3.375 g 100 mL/hr over 30 Minutes Intravenous  Once 12/16/15 1354 12/16/15 1437   12/16/15 1330  cefTRIAXone (ROCEPHIN) 2 g in dextrose 5 % 50 mL IVPB  Status:  Discontinued     2 g 100 mL/hr over 30 Minutes Intravenous  Once 12/16/15 1321 12/16/15 1354   12/16/15 1330  metroNIDAZOLE (FLAGYL) IVPB 500 mg  Status:  Discontinued     500 mg 100 mL/hr over 60 Minutes Intravenous  Once 12/16/15  1321 12/16/15 1354       Assessment/Plan: s/p Procedure(s): APPENDECTOMY LAPAROSCOPIC Postop abscesses. Status post Percutaneous drainage of abdominal abscess. Stable. Continue IV antibiotics.   LOS: 6 days    Shirlean Berman T 12/22/2015

## 2015-12-23 ENCOUNTER — Other Ambulatory Visit: Payer: Self-pay | Admitting: Surgery

## 2015-12-23 DIAGNOSIS — R188 Other ascites: Secondary | ICD-10-CM

## 2015-12-23 LAB — CBC
HCT: 30.3 % — ABNORMAL LOW (ref 39.0–52.0)
HEMOGLOBIN: 10 g/dL — AB (ref 13.0–17.0)
MCH: 19 pg — AB (ref 26.0–34.0)
MCHC: 33 g/dL (ref 30.0–36.0)
MCV: 57.5 fL — AB (ref 78.0–100.0)
Platelets: 363 10*3/uL (ref 150–400)
RBC: 5.27 MIL/uL (ref 4.22–5.81)
RDW: 15 % (ref 11.5–15.5)
WBC: 13.1 10*3/uL — ABNORMAL HIGH (ref 4.0–10.5)

## 2015-12-23 LAB — GLUCOSE, CAPILLARY
Glucose-Capillary: 210 mg/dL — ABNORMAL HIGH (ref 65–99)
Glucose-Capillary: 146 mg/dL — ABNORMAL HIGH (ref 65–99)

## 2015-12-23 MED ORDER — SACCHAROMYCES BOULARDII 250 MG PO CAPS: ORAL_CAPSULE | ORAL | 0 refills | Status: DC

## 2015-12-23 MED ORDER — METRONIDAZOLE 500 MG PO TABS: 500.0000 mg | ORAL_TABLET | Freq: Three times a day (TID) | ORAL | 0 refills | Status: AC

## 2015-12-23 MED ORDER — OXYCODONE-ACETAMINOPHEN 5-325 MG PO TABS: 1.0000 | ORAL_TABLET | Freq: Four times a day (QID) | ORAL | 0 refills | Status: DC | PRN

## 2015-12-23 MED ORDER — AMOXICILLIN-POT CLAVULANATE 875-125 MG PO TABS: 1.0000 | ORAL_TABLET | Freq: Two times a day (BID) | ORAL | 0 refills | Status: AC

## 2015-12-23 MED FILL — AMOX-CLAV 875-125 MG TABLET: 875-125 | 14 days supply | Qty: 28 | Fill #0

## 2015-12-23 MED FILL — metroNIDAZOLE 500 MG TABS: 500 | 14 days supply | Qty: 42 | Fill #0

## 2015-12-23 NOTE — Progress Notes (Signed)
Discharge instructions given to pt and his mother, verbalized understanding. Left the unit in stable condition.

## 2015-12-23 NOTE — Progress Notes (Signed)
Patient ID: Frank Schneider, male   DOB: 01/13/1976, 40 y.o.   MRN: [id number]    Referring Physician(s): Coralie Keens  Supervising Physician: Sandi Mariscal  Patient Status: inpt  Chief Complaint: Abdominal abscess secondary to perforated appendicitis  Subjective: Patient feels well.  Eager to go home.  Says mom knows how to flush and manage drain.   Allergies: Review of patient's allergies indicates no known allergies.  Medications: Prior to Admission medications   Medication Sig Start Date End Date Taking? Authorizing Provider  amLODipine (NORVASC) 10 MG tablet Take 1 tablet (10 mg total) by mouth daily. 10/04/15  Yes Dorena Dew, FNP  clopidogrel (PLAVIX) 75 MG tablet Take 1 tablet (75 mg total) by mouth daily. 10/04/15  Yes Dorena Dew, FNP  fenofibrate (TRICOR) 145 MG tablet Take 1 tablet (145 mg total) by mouth daily. 10/04/15  Yes Dorena Dew, FNP  ferrous sulfate 325 (65 FE) MG tablet TAKE 1 TABLET BY MOUTH DAILY WITH BREAKFAST 11/25/15  Yes Micheline Chapman, NP  gabapentin (NEURONTIN) 400 MG capsule Take 1 capsule (400 mg total) by mouth 3 (three) times daily. 10/04/15  Yes Dorena Dew, FNP  insulin glargine (LANTUS) 100 UNIT/ML injection Inject 0.2 mLs (20 Units total) into the skin daily. 06/12/15  Yes Tresa Garter, MD  lisinopril (PRINIVIL,ZESTRIL) 20 MG tablet Take 1 tablet (20 mg total) by mouth daily. 08/07/15  Yes Dorena Dew, FNP  Blood Glucose Monitoring Suppl (TRUE METRIX AIR GLUCOSE METER) DEVI 1 each by Does not apply route 2 (two) times daily at 10 AM and 5 PM. 01/15/15   Dorena Dew, FNP  glucose blood (TRUE METRIX BLOOD GLUCOSE TEST) test strip Use as instructed 01/15/15   Dorena Dew, FNP  Insulin Syringe-Needle U-100 (INSULIN SYRINGE .5CC/30GX1/2") 30G X 1/2" 0.5 ML MISC 100 11/25/15   Micheline Chapman, NP  Lancets (FREESTYLE) lancets Use as instructed 01/03/15   Reyne Dumas, MD  ondansetron (ZOFRAN ODT) 4 MG disintegrating  tablet Take 1 tablet (4 mg total) by mouth every 8 (eight) hours as needed for nausea or vomiting. 12/16/15   Merryl Hacker, MD    Vital Signs: BP (!) 144/75 (BP Location: Left Arm)   Pulse 87   Temp 98.7 F (37.1 C) (Oral)   Resp 16   Ht 5\' 10"  (1.778 m)   Wt 211 lb 3.2 oz (95.8 kg)   SpO2 100%   BMI 30.30 kg/m   Physical Exam: Abd: soft, minimally tender, drain site is c/d/i, drain with serosang. Output.  30cc/24hrs  Imaging: Ct Abdomen Pelvis W Contrast  Result Date: 12/20/2015 CLINICAL DATA:  Inpatient. Status post appendectomy 4 days prior with persistent postoperative fever and leukocytosis. EXAM: CT ABDOMEN AND PELVIS WITH CONTRAST TECHNIQUE: Multidetector CT imaging of the abdomen and pelvis was performed using the standard protocol following bolus administration of intravenous contrast. CONTRAST:  170mL ISOVUE-300 IOPAMIDOL (ISOVUE-300) INJECTION 61% COMPARISON:  12/16/2015 CT abdomen/ pelvis. FINDINGS: Lower chest: New patchy consolidation and ground-glass opacity in the basilar right upper lobe, right middle lobe and right lower lobe. Separate segmental atelectasis in the medial lower lobe bases and lingula. Hepatobiliary: Normal liver with no liver mass. Normal gallbladder with no radiopaque cholelithiasis. No biliary ductal dilatation. Pancreas: Normal, with no mass or duct dilation. Spleen: Normal size. No mass. Adrenals/Urinary Tract: Normal adrenals. Normal kidneys with no hydronephrosis and no renal mass. Relatively collapsed bladder with the suggestion of new diffuse bladder wall thickening. Stomach/Bowel:  Mild gastric dilatation with fluid filling most of the stomach and no appreciable gastric wall thickening. There is diffuse mild dilatation of the small bowel with fluid levels throughout the small bowel. Status post appendectomy. There is a 6.6 x 2.9 cm fluid collection in the right lower quadrant inferior to the appendectomy bed (series 2/ image 75), which demonstrates a  mildly thickened hyper enhancing wall. There is a smaller similar-appearing 3.3 x 1.9 cm anterior right lower quadrant fluid collection (series 2/ image 75). There is a 4.7 x 3.8 cm fluid collection in the midline deep pelvis (series 2/image 80) with mildly thickened enhancing wall. Diffuse mild dilatation of the right and transverse colon with colonic fluid levels and no large bowel wall thickening. Vascular/Lymphatic: Atherosclerotic nonaneurysmal abdominal aorta. Patent portal, splenic, hepatic and renal veins. No pathologically enlarged lymph nodes in the abdomen or pelvis. Reproductive: Normal size prostate. Other: There is a small amount of free air in the anterior upper peritoneal cavity, within normal recent postoperative limits. There is scattered free air throughout the extraperitoneal space anteriorly in the bilateral pelvis and lower abdomen. There is subcutaneous emphysema in the left groin and paraumbilical ventral abdominal wall. There is fat stranding throughout the mesenteric, lower omental and pelvic peritoneal and extraperitoneal fat. Musculoskeletal: No aggressive appearing focal osseous lesions. Moderate thoracolumbar spondylosis. Symmetric gynecomastia. IMPRESSION: 1. Three intraperitoneal fluid collections demonstrating mildly thickened enhancing walls, two located in the right lower quadrant inferior to the appendectomy bed and one located in the midline deep pelvis, suspicious for infected collections. 2. CT findings demonstrate mild diffuse adynamic ileus of the small and large bowel. 3. New mild patchy consolidation and ground-glass opacity at the right lung base, which could indicate aspiration or pneumonia. Superimposed mild-to-moderate bibasilar atelectasis. 4. Suggestion of mild diffuse bladder wall thickening, probably reactive. 5. Aortic atherosclerosis. Electronically Signed   By: Ilona Sorrel M.D.   On: 12/20/2015 11:28   Ct Image Guided Drainage By Percutaneous  Catheter  Result Date: 12/21/2015 INDICATION: Right lower quadrant postop abscess EXAM: CT GUIDED DRAINAGE OF RIGHT LOWER QUADRANT ABSCESS MEDICATIONS: The patient is currently admitted to the hospital and receiving intravenous antibiotics. The antibiotics were administered within an appropriate time frame prior to the initiation of the procedure. ANESTHESIA/SEDATION: 1.0 mg IV Versed 50 mcg IV Fentanyl Moderate Sedation Time:  13 MINUTES The patient was continuously monitored during the procedure by the interventional radiology nurse under my direct supervision. COMPLICATIONS: None immediate. TECHNIQUE: Informed written consent was obtained from the patient after a thorough discussion of the procedural risks, benefits and alternatives. All questions were addressed. Maximal Sterile Barrier Technique was utilized including caps, mask, sterile gowns, sterile gloves, sterile drape, hand hygiene and skin antiseptic. A timeout was performed prior to the initiation of the procedure. PROCEDURE: Previous imaging reviewed. Patient positioned supine. Noncontrast localization CT performed. The right lower quadrant dominant abscess was localized. Overlying skin was marked. The right lower quadrant was prepped with ChloraPrep in a sterile fashion, and a sterile drape was applied covering the operative field. A sterile gown and sterile gloves were used for the procedure. Local anesthesia was provided with 1% Lidocaine. Under sterile conditions and local anesthesia, CT guidance was utilized to advance an 18 gauge 10 cm access needle from a lateral oblique approach into the dominant abscess. Needle position confirmed with CT. There was return of bloody exudative fluid. Sample sent for Gram stain and culture. Guidewire inserted followed by tract dilatation to insert a 12 [place] drain. Retention  loop formed in the abscess cavity. Syringe aspiration yielded 30 cc bloody exudative fluid. Catheter position confirmed with CT. Catheter  was secured with Prolene suture and connected to external suction bulb. Sterile dressing applied. No immediate complication. Patient tolerated the procedure well. FINDINGS: Imaging confirms needle insertion into the dominant right lower quadrant abdominal abscess for drain insertion IMPRESSION: Successful CT-guided right lower quadrant abdominal abscess drain insertion. Electronically Signed   By: Jerilynn Mages.  Shick M.D.   On: 12/21/2015 11:37    Labs:  CBC:  Recent Labs  12/19/15 0603 12/20/15 0550 12/21/15 0523 12/23/15 0509  WBC 15.8* 13.7* 15.1* 13.1*  HGB 10.3* 10.5* 9.7* 10.0*  HCT 30.8* 30.9* 28.7* 30.3*  PLT 161 225 268 363    COAGS:  Recent Labs  12/31/14 2237 05/09/15 1704 12/21/15 0523  INR 0.99 1.06 1.08  APTT 27 25  --     BMP:  Recent Labs  12/18/15 0603 12/19/15 0603 12/20/15 0550 12/21/15 0523  NA 131* 132* 132* 132*  K 4.4 5.0 4.4 3.8  CL 98* 99* 97* 97*  CO2 24 24 25 26   GLUCOSE 221* 279* 301* 250*  BUN 11 9 9 17   CALCIUM 8.0* 8.0* 8.3* 8.3*  CREATININE 1.21 1.21 1.13 1.47*  GFRNONAA >60 >60 >60 58*  GFRAA >60 >60 >60 >60    LIVER FUNCTION TESTS:  Recent Labs  10/04/15 1454 12/15/15 2202 12/16/15 1018 12/17/15 0554  BILITOT 0.4 0.7 0.7 1.2  AST 12 17 17 15   ALT 13 15* 17 13*  ALKPHOS 58 47 63 43  PROT 6.8 7.1 8.2* 6.5  ALBUMIN 4.2 4.3 5.0 3.8    Assessment and Plan: 1. S/p percutaneous drain for IAA on 8/26, Shick -patient doing well with his drain.  He is stable for DC home from our standpoint.  I have arranged drain clinic follow up for him next week with a repeat CT scan and possible drain injection. -he states his mother knows how to flush his drain.  He will flush his drain with 5-10cc of NS daily.  Electronically Signed: Henreitta Cea 12/23/2015, 10:02 AM   I spent a total of 15 Minutes at the the patient's bedside AND on the patient's hospital floor or unit, greater than 50% of which was counseling/coordinating care for  intra-abdominal abscess

## 2015-12-23 NOTE — Discharge Instructions (Addendum)
Flush drain with 5-10cc of Normal Saline daily   LAPAROSCOPIC SURGERY: POST OP INSTRUCTIONS  1. DIET: Follow a light bland diet the first 24 hours after arrival home, such as soup, liquids, crackers, etc. Be sure to include lots of fluids daily. Avoid fast food or heavy meals as your are more likely to get nauseated. Eat a low fat the next few days after surgery.  2. Take your usually prescribed home medications unless otherwise directed. 3. PAIN CONTROL:  1. Pain is best controlled by a usual combination of three different methods TOGETHER:  1. Ice/Heat 2. Over the counter pain medication 3. Prescription pain medication 2. Most patients will experience some swelling and bruising around the incisions. Ice packs or heating pads (30-60 minutes up to 6 times a day) will help. Use ice for the first few days to help decrease swelling and bruising, then switch to heat to help relax tight/sore spots and speed recovery. Some people prefer to use ice alone, heat alone, alternating between ice & heat. Experiment to what works for you. Swelling and bruising can take several weeks to resolve.  3. It is helpful to take an over-the-counter pain medication regularly for the first few weeks. Choose one of the following that works best for you:  1. Naproxen (Aleve, etc) Two 220mg  tabs twice a day 2. Ibuprofen (Advil, etc) Three 200mg  tabs four times a day (every meal & bedtime) 3. Acetaminophen (Tylenol, etc) 500-650mg  four times a day (every meal & bedtime) 4. A prescription for pain medication (such as oxycodone, hydrocodone, etc) should be given to you upon discharge. Take your pain medication as prescribed.  1. If you are having problems/concerns with the prescription medicine (does not control pain, nausea, vomiting, rash, itching, etc), please call us [phone] to see if we need to switch you to a different pain medicine that will work better for you and/or control your side effect better. 2. If you need  a refill on your pain medication, please contact your pharmacy. They will contact our office to request authorization. Prescriptions will not be filled after 5 pm or on week-ends. 4. Avoid getting constipated. Between the surgery and the pain medications, it is common to experience some constipation. Increasing fluid intake and taking a fiber supplement (such as Metamucil, Citrucel, FiberCon, MiraLax, etc) 1-2 times a day regularly will usually help prevent this problem from occurring. A mild laxative (prune juice, Milk of Magnesia, MiraLax, etc) should be taken according to package directions if there are no bowel movements after 48 hours.  5. Watch out for diarrhea. If you have many loose bowel movements, simplify your diet to bland foods & liquids for a few days. Stop any stool softeners and decrease your fiber supplement. Switching to mild anti-diarrheal medications (Kayopectate, Pepto Bismol) can help. If this worsens or does not improve, please call us. 6. Wash / shower every day. You may shower over the dressings as they are waterproof. Continue to shower over incision(s) after the dressing is off. 7. Remove your waterproof bandages 5 days after surgery. You may leave the incision open to air. You may replace a dressing/Band-Aid to cover the incision for comfort if you wish.  8. ACTIVITIES as tolerated:  1. You may resume regular (light) daily activities beginning the next day--such as daily self-care, walking, climbing stairs--gradually increasing activities as tolerated. If you can walk 30 minutes without difficulty, it is safe to try more intense activity such as jogging, treadmill, bicycling, low-impact aerobics, swimming,  etc. 2. Save the most intensive and strenuous activity for last such as sit-ups, heavy lifting, contact sports, etc Refrain from any heavy lifting or straining until you are off narcotics for pain control.  3. DO NOT PUSH THROUGH PAIN. Let pain be your guide: If it hurts to do  something, don't do it. Pain is your body warning you to avoid that activity for another week until the pain goes down. 4. You may drive when you are no longer taking prescription pain medication, you can comfortably wear a seatbelt, and you can safely maneuver your car and apply brakes. 5. You may have sexual intercourse when it is comfortable.  9. FOLLOW UP in our office  1. Please call [facility] at [phone] to set up an appointment to see your surgeon in the office for a follow-up appointment approximately 2-3 weeks after your surgery. 2. Make sure that you call for this appointment the day you arrive home to insure a convenient appointment time.      10. IF YOU HAVE DISABILITY OR FAMILY LEAVE FORMS, BRING THEM TO THE OFFICE FOR PROCESSING.       11.  Take a Probiotic daily as long as you are taking antibiotics.  Do not drink any alcohol.   WHEN TO CALL us [phone]:  1. Poor pain control 2. Reactions / problems with new medications (rash/itching, nausea, etc)  3. Fever over 101.5 F (38.5 C) 4. Inability to urinate 5. Nausea and/or vomiting 6. Worsening swelling or bruising 7. Continued bleeding from incision. 8. Increased pain, redness, or drainage from the incision  The clinic staff is available to answer your questions during regular business hours (8:30am-5pm). Please dont hesitate to call and ask to speak to one of our nurses for clinical concerns.  If you have a medical emergency, go to the nearest emergency room or call 911.  A surgeon from [facility] is always on call at the [facility], [place], [address], [place], [place] [address] ?  MAIN: [phone] ? TOLL FREE: [phone] ?  FAX [phone]) [phone]  [email or web address]

## 2015-12-23 NOTE — Progress Notes (Signed)
Patient ID: Frank Schneider, male   DOB: Aug 08, 1975, 40 y.o.   MRN: [id number]  [facility] Progress Note  7 Days Post-Op  Subjective: States that he is ready to go home today. Denies any current pain. Tolerating diet and having daily BM's.  Objective: Vital signs in last 24 hours: Temp:  [98.7 F (37.1 C)-99.8 F (37.7 C)] 98.7 F (37.1 C) (08/28 0542) Pulse Rate:  [87-93] 87 (08/28 0542) Resp:  [16-18] 16 (08/28 0542) BP: (144-161)/(73-87) 144/75 (08/28 0542) SpO2:  [98 %-100 %] 100 % (08/28 0542) Weight:  [211 lb 3.2 oz (95.8 kg)] 211 lb 3.2 oz (95.8 kg) (08/28 0542) Last BM Date: 12/21/15  Intake/Output from previous day: 08/27 0701 - 08/28 0700 In: 605 [I.V.:600] Out: 630 [Urine:600; Drains:30] Intake/Output this shift: No intake/output data recorded.  PE: Gen:  Alert, NAD, cooperative Card:  RRR, no M/G/R heard Pulm:  CTA, no W/R/R Abd: Soft, NT/ND, +BS, incision cdi, drain with minimal serosanguinous output   Lab Results:   Recent Labs  12/21/15 0523 12/23/15 0509  WBC 15.1* 13.1*  HGB 9.7* 10.0*  HCT 28.7* 30.3*  PLT 268 363   BMET  Recent Labs  12/21/15 0523  NA 132*  K 3.8  CL 97*  CO2 26  GLUCOSE 250*  BUN 17  CREATININE 1.47*  CALCIUM 8.3*   PT/INR  Recent Labs  12/21/15 0523  LABPROT 14.0  INR 1.08   CMP     Component Value Date/Time   NA 132 (L) 12/21/2015 0523   K 3.8 12/21/2015 0523   CL 97 (L) 12/21/2015 0523   CO2 26 12/21/2015 0523   GLUCOSE 250 (H) 12/21/2015 0523   BUN 17 12/21/2015 0523   CREATININE 1.47 (H) 12/21/2015 0523   CREATININE 1.00 10/04/2015 1454   CALCIUM 8.3 (L) 12/21/2015 0523   PROT 6.5 12/17/2015 0554   ALBUMIN 3.8 12/17/2015 0554   AST 15 12/17/2015 0554   ALT 13 (L) 12/17/2015 0554   ALKPHOS 43 12/17/2015 0554   BILITOT 1.2 12/17/2015 0554   GFRNONAA 58 (L) 12/21/2015 0523   GFRNONAA >89 10/04/2015 1454   GFRAA >60 12/21/2015 0523   GFRAA >89 10/04/2015 1454   Lipase      Component Value Date/Time   LIPASE 111 (H) 12/16/2015 1018       Studies/Results: Ct Image Guided Drainage By Percutaneous Catheter  Result Date: 12/21/2015 INDICATION: Right lower quadrant postop abscess EXAM: CT GUIDED DRAINAGE OF RIGHT LOWER QUADRANT ABSCESS MEDICATIONS: The patient is currently admitted to the hospital and receiving intravenous antibiotics. The antibiotics were administered within an appropriate time frame prior to the initiation of the procedure. ANESTHESIA/SEDATION: 1.0 mg IV Versed 50 mcg IV Fentanyl Moderate Sedation Time:  13 MINUTES The patient was continuously monitored during the procedure by the interventional radiology nurse under my direct supervision. COMPLICATIONS: None immediate. TECHNIQUE: Informed written consent was obtained from the patient after a thorough discussion of the procedural risks, benefits and alternatives. All questions were addressed. Maximal Sterile Barrier Technique was utilized including caps, mask, sterile gowns, sterile gloves, sterile drape, hand hygiene and skin antiseptic. A timeout was performed prior to the initiation of the procedure. PROCEDURE: Previous imaging reviewed. Patient positioned supine. Noncontrast localization CT performed. The right lower quadrant dominant abscess was localized. Overlying skin was marked. The right lower quadrant was prepped with ChloraPrep in a sterile fashion, and a sterile drape was applied covering the operative field. A sterile gown and sterile gloves were used  for the procedure. Local anesthesia was provided with 1% Lidocaine. Under sterile conditions and local anesthesia, CT guidance was utilized to advance an 18 gauge 10 cm access needle from a lateral oblique approach into the dominant abscess. Needle position confirmed with CT. There was return of bloody exudative fluid. Sample sent for Gram stain and culture. Guidewire inserted followed by tract dilatation to insert a 12 [place] drain. Retention loop  formed in the abscess cavity. Syringe aspiration yielded 30 cc bloody exudative fluid. Catheter position confirmed with CT. Catheter was secured with Prolene suture and connected to external suction bulb. Sterile dressing applied. No immediate complication. Patient tolerated the procedure well. FINDINGS: Imaging confirms needle insertion into the dominant right lower quadrant abdominal abscess for drain insertion IMPRESSION: Successful CT-guided right lower quadrant abdominal abscess drain insertion. Electronically Signed   By: Jerilynn Mages.  Shick M.D.   On: 12/21/2015 11:37    Anti-infectives: Anti-infectives    Start     Dose/Rate Route Frequency Ordered Stop   12/16/15 2000  piperacillin-tazobactam (ZOSYN) IVPB 3.375 g     3.375 g 12.5 mL/hr over 240 Minutes Intravenous Every 8 hours 12/16/15 1458     12/16/15 1400  piperacillin-tazobactam (ZOSYN) IVPB 3.375 g     3.375 g 100 mL/hr over 30 Minutes Intravenous  Once 12/16/15 1354 12/16/15 1437   12/16/15 1330  cefTRIAXone (ROCEPHIN) 2 g in dextrose 5 % 50 mL IVPB  Status:  Discontinued     2 g 100 mL/hr over 30 Minutes Intravenous  Once 12/16/15 1321 12/16/15 1354   12/16/15 1330  metroNIDAZOLE (FLAGYL) IVPB 500 mg  Status:  Discontinued     500 mg 100 mL/hr over 60 Minutes Intravenous  Once 12/16/15 1321 12/16/15 1354       Assessment/Plan Acute appendicitis with necrosis S/P laparoscopic appendectomy 12/16/15 Dr. Coralie Keens POD 7 S/p perc drain 8/26 Dr. Annamaria Boots - culture pending. States that his mother knows how to flush drain. Will follow-up in drain clinic next week. Leukocytosis and fever  - WBC trending down, 13.1 today - no recent fevers Hyponatremia - continues to be slightly low at 132, continue IVF Ileus - resolved. Having regular BM's. No n/v.  DM - followed by diabetes coordinator. Glucose 210 today. FEN: Carb modified diet, IVF ID: Day 8 Zosyn DVT: heparin, SCD's  Plan: patient eager to go home. Will discuss home  antibiotics with MD. Will plan follow-up in [facility] in about 1 week   LOS: 7 days    Jerrye Beavers , [facility] 12/23/2015, 9:59 AM Pager: [phone] Consults: [phone] Mon-Fri 7:00 am-4:30 pm Sat-Sun 7:00 am-11:30 am

## 2015-12-23 NOTE — Discharge Summary (Signed)
[facility] Discharge Summary   Patient ID: Frank Schneider MRN: [id number] DOB/AGE: June 09, 1975 40 y.o.  Admit date: 12/16/2015 Discharge date: 12/23/2015  Admitting Diagnosis: Acute appendicitis with generalized peritonitis  Discharge Diagnosis Patient Active Problem List   Diagnosis Date Noted  . Acute appendicitis 12/16/2015  . Right cataract 10/04/2015  . Tobacco dependence 10/04/2015  . Absolute anemia 08/07/2015  . Neuropathy ([place]) 08/07/2015  . Cerebrovascular accident (CVA) ([place])   . Leukocytosis   . Stroke (cerebrum) ([place]) 05/09/2015  . 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 Hyperlipidemia 02/14/2015  . History of CVA (cerebrovascular accident) 02/14/2015  . CVA (cerebral vascular accident) ([place]) 01/01/2015  . Diabetes mellitus type 2 with complications, uncontrolled ([place]) 01/01/2015  . Stroke ([place])   . Essential hypertension   . Smoker   . Tobacco use     Consultants Dr. Arne Cleveland, GI  Imaging: CT abdomen pelvis w contrast 12/16/15: -Acute appendicitis with indistinct appearance of the appendiceal wall and small amount of retro appendiceal fluid. Micro rupture cannot be excluded. Frank abscess formation is not seen. -Minimal atherosclerotic disease of the distal aorta and common iliac Arteries.  CT abdomen pelvis w contrast 12/20/15: IMPRESSION: 1. Three intraperitoneal fluid collections demonstrating mildly thickened enhancing walls, two located in the right lower quadrant inferior to the appendectomy bed and one located in the midline deep pelvis, suspicious for infected collections. 2. CT findings demonstrate mild diffuse adynamic ileus of the small and large bowel. 3. New mild patchy consolidation and ground-glass opacity at the right lung base, which could indicate aspiration or pneumonia. Superimposed mild-to-moderate bibasilar atelectasis. 4. Suggestion of mild diffuse bladder wall thickening,  probably reactive. 5. Aortic atherosclerosis.   Procedures Dr. Ninfa Linden (12/16/15) - Laparoscopic appendectomy Dr. Annamaria Boots (12/21/15) - CT guided drainage by percutaneous catheter  Hospital Course:  Frank Schneider is a 40yo male who presented to [facility] 12/15/15 with acute onset abdominal pain with associated nausea and vomiting.  Workup showed leukocytosis and acute appendicitis with no perforation or abscess formation.  Patient was admitted and underwent procedure listed above.  Tolerated procedure well and was transferred to the floor.  POD1-4 he continued to have leukocytosis, worsening pain, and fevers so a CT scan was ordered and showed 3 intraperitoneal fluid collections concerning for abscess. IR was consulted who placed a perc drain 12/21/15. On 12/23/15 the patient was doing much better, leukocytosis was resolving, voiding well, tolerating diet, ambulating well, pain well controlled, vital signs stable, incisions c/d/i and felt stable for discharge home.  Patient will follow up in wound clinic next week, and in our office in 2 weeks and knows to call with questions or concerns.  He will be on 2 weeks of Augmentin, Flagyl and daily probiotic.  Physical Exam: Gen:  Alert, NAD, cooperative Card:  RRR, no M/G/R heard Pulm:  CTA, no W/R/R Abd: Soft, NT/ND, +BS, incision cdi, drain with minimal serosanguinous output    Medication List    TAKE these medications   amLODipine 10 MG tablet Commonly known as:  NORVASC Take 1 tablet (10 mg total) by mouth daily.   amoxicillin-clavulanate 875-125 MG tablet Commonly known as:  AUGMENTIN Take 1 tablet by mouth 2 (two) times daily.   clopidogrel 75 MG tablet Commonly known as:  PLAVIX Take 1 tablet (75 mg total) by mouth daily.   fenofibrate 145 MG tablet Commonly known as:  TRICOR Take 1 tablet (145 mg total) by mouth daily.   ferrous sulfate 325 (  65 FE) MG tablet TAKE 1 TABLET BY MOUTH DAILY WITH BREAKFAST   freestyle lancets Use as  instructed   gabapentin 400 MG capsule Commonly known as:  NEURONTIN Take 1 capsule (400 mg total) by mouth 3 (three) times daily.   glucose blood test strip Commonly known as:  TRUE METRIX BLOOD GLUCOSE TEST Use as instructed   insulin glargine 100 UNIT/ML injection Commonly known as:  LANTUS Inject 0.2 mLs (20 Units total) into the skin daily.   INSULIN SYRINGE .5CC/30GX1/2" 30G X 1/2" 0.5 ML Misc 100   lisinopril 20 MG tablet Commonly known as:  PRINIVIL,ZESTRIL Take 1 tablet (20 mg total) by mouth daily.   metroNIDAZOLE 500 MG tablet Commonly known as:  FLAGYL Take 1 tablet (500 mg total) by mouth 3 (three) times daily.   ondansetron 4 MG disintegrating tablet Commonly known as:  ZOFRAN ODT Take 1 tablet (4 mg total) by mouth every 8 (eight) hours as needed for nausea or vomiting.   oxyCODONE-acetaminophen 5-325 MG tablet Commonly known as:  PERCOCET/ROXICET Take 1 tablet by mouth every 6 (six) hours as needed for moderate pain or severe pain.   saccharomyces boulardii 250 MG capsule Commonly known as:  FLORASTOR You can pick this up over the counter at any pharmacy. I would recommend starting now and continuing for at least 1-2 weeks after you finish all of your antibiotics.   TRUE METRIX AIR GLUCOSE METER Devi 1 each by Does not apply route 2 (two) times daily at 10 AM and 5 PM.        Follow-up Information    SHICK, MICHAEL, MD Follow up in 1 week(s).   Specialty:  Interventional Radiology Why:  our office will call you with appointment time and date Contact information: [address] [place] [address] [phone]        Harl Bowie, MD .   Specialty:  General Surgery Why:  01/07/16 at 11:20am, please arrive at 10:50am to fill out necessary paperwork. If you do not arrive 30 minutes prior to appointment we may have to reschedule you. Contact information: [address] [place] [address] [phone]            Signed: Jerrye Beavers, [facility] 12/23/2015, 12:40 PM Pager: [phone] Consults: [phone] Mon-Fri 7:00 am-4:30 pm Sat-Sun 7:00 am-11:30 am

## 2015-12-24 LAB — CULTURE, BLOOD (ROUTINE X 2)
Culture: NO GROWTH
Culture: NO GROWTH

## 2015-12-26 LAB — AEROBIC/ANAEROBIC CULTURE W GRAM STAIN (SURGICAL/DEEP WOUND): Special Requests: NORMAL

## 2015-12-26 LAB — AEROBIC/ANAEROBIC CULTURE (SURGICAL/DEEP WOUND): CULTURE: NO GROWTH

## 2016-01-06 ENCOUNTER — Ambulatory Visit (INDEPENDENT_AMBULATORY_CARE_PROVIDER_SITE_OTHER): Payer: Medicaid Other | Admitting: Family Medicine

## 2016-01-06 ENCOUNTER — Encounter: Payer: Self-pay | Admitting: Family Medicine

## 2016-01-06 DIAGNOSIS — E785 Hyperlipidemia, unspecified: Secondary | ICD-10-CM

## 2016-01-06 DIAGNOSIS — Z794 Long term (current) use of insulin: Secondary | ICD-10-CM

## 2016-01-06 DIAGNOSIS — E118 Type 2 diabetes mellitus with unspecified complications: Secondary | ICD-10-CM | POA: Diagnosis not present

## 2016-01-06 DIAGNOSIS — I10 Essential (primary) hypertension: Secondary | ICD-10-CM

## 2016-01-06 DIAGNOSIS — E1165 Type 2 diabetes mellitus with hyperglycemia: Secondary | ICD-10-CM

## 2016-01-06 DIAGNOSIS — IMO0002 Reserved for concepts with insufficient information to code with codable children: Secondary | ICD-10-CM

## 2016-01-06 DIAGNOSIS — Z8673 Personal history of transient ischemic attack (TIA), and cerebral infarction without residual deficits: Secondary | ICD-10-CM | POA: Diagnosis not present

## 2016-01-06 MED ORDER — AMLODIPINE BESYLATE 10 MG PO TABS: 10.0000 mg | ORAL_TABLET | Freq: Every day | ORAL | 2 refills | Status: DC

## 2016-01-06 MED ORDER — LISINOPRIL 20 MG PO TABS: 20.0000 mg | ORAL_TABLET | Freq: Every day | ORAL | 1 refills | Status: DC

## 2016-01-06 MED ORDER — CLOPIDOGREL BISULFATE 75 MG PO TABS: 75.0000 mg | ORAL_TABLET | Freq: Every day | ORAL | 2 refills | Status: DC

## 2016-01-06 MED ORDER — FENOFIBRATE 145 MG PO TABS: 145.0000 mg | ORAL_TABLET | Freq: Every day | ORAL | 1 refills | Status: DC

## 2016-01-06 MED ORDER — INSULIN GLARGINE 100 UNIT/ML ~~LOC~~ SOLN: 20.0000 [IU] | Freq: Every day | SUBCUTANEOUS | 3 refills | Status: DC

## 2016-01-06 MED FILL — FERROUS SULFATE 325 MG TAB: 325 (65 FE) | 30 days supply | Qty: 30 | Fill #1

## 2016-01-06 MED FILL — ?LISINOPRIL 20 MG TABLET: 20 | 30 days supply | Qty: 30 | Fill #4

## 2016-01-06 MED FILL — AMLODIPINE BESYLATE 10 MG T: 10 | 30 days supply | Qty: 30 | Fill #2

## 2016-01-06 MED FILL — CLOPIDOGREL 75 MG TABLET: 75 | 30 days supply | Qty: 30 | Fill #2

## 2016-01-06 NOTE — Patient Instructions (Signed)
Will refer to eye doctor and neurologist Continue current medications.

## 2016-01-07 MED FILL — FENOFIBRATE 145 MG TABLET: 145 | 30 days supply | Qty: 30 | Fill #0

## 2016-01-08 ENCOUNTER — Ambulatory Visit
Admission: RE | Admit: 2016-01-08 | Discharge: 2016-01-08 | Disposition: A | Discharge status: Home / Self Care | Payer: Medicaid Other | Source: Ambulatory Visit | Attending: General Surgery | Admitting: General Surgery

## 2016-01-08 ENCOUNTER — Ambulatory Visit
Admission: RE | Admit: 2016-01-08 | Discharge: 2016-01-08 | Disposition: A | Discharge status: Home / Self Care | Payer: Medicaid Other | Source: Ambulatory Visit | Attending: Surgery | Admitting: Surgery

## 2016-01-08 DIAGNOSIS — R188 Other ascites: Secondary | ICD-10-CM

## 2016-01-08 HISTORY — PX: IR GENERIC HISTORICAL: IMG1180011

## 2016-01-08 MED ORDER — IOPAMIDOL (ISOVUE-300) INJECTION 61%
100.0000 mL | Freq: Once | INTRAVENOUS | Status: AC | PRN
Start: 2016-01-08 — End: 2016-01-08
  Administered 2016-01-08: 100 mL via INTRAVENOUS

## 2016-01-08 NOTE — Progress Notes (Signed)
Patient ID: Frank Schneider, male   DOB: February 21, 1976, 40 y.o.   MRN: [id number]       Chief Complaint:  Postop appendectomy abscess. Outpatient follow-up. Percutaneous drain.   Referring Physician(s): Blackman,D  History of Present Illness: Frank Schneider is a 40 y.o. male with acute appendicitis and peritonitis status post appendectomy. Postoperative course complicated by right lower quadrant and pelvic abscess. Right lower quadrant drain inserted. Patient now returns for outpatient follow-up. He is about to complete his oral antibiotics. No significant drain output over the last 3-5 days. No fecal contaminated material in the drainage bag. No fevers. Stable weight. Good appetite and diet. Overall he feels much better. Repeat CT today demonstrates resolution of the fluid collections in the right lower quadrant and pelvis. Drain catheter injection confirms no fistula to the ileum or cecum. Overall he is doing very well.  Past Medical History:  Diagnosis Date  . Blind right eye   . Diabetes mellitus    Takes Metformin  . Hypertension   . Priapism   . Stroke ([place])   . Tobacco use   . Vision abnormalities     Past Surgical History:  Procedure Laterality Date  . APPENDECTOMY    . INCISION AND DRAINAGE PERIRECTAL ABSCESS  07/28/2011   Procedure: IRRIGATION AND DEBRIDEMENT PERIRECTAL ABSCESS;  Surgeon: Adin Hector, MD;  Location: [facility];  Service: General;  Laterality: N/A;   of skin muscle and subcutaneous tissue of perimeum  8cmx12cm area   . LAPAROSCOPIC APPENDECTOMY N/A 12/16/2015   Procedure: APPENDECTOMY LAPAROSCOPIC;  Surgeon: Coralie Keens, MD;  Location: [facility];  Service: General;  Laterality: N/A;  . PENECTOMY      Allergies: Review of patient's allergies indicates no known allergies.  Medications: Prior to Admission medications   Medication Sig Start Date End Date Taking? Authorizing Provider  amLODipine (NORVASC) 10 MG tablet Take 1 tablet (10 mg total) by mouth  daily. 01/06/16   Micheline Chapman, NP  Blood Glucose Monitoring Suppl (TRUE METRIX AIR GLUCOSE METER) DEVI 1 each by Does not apply route 2 (two) times daily at 10 AM and 5 PM. 01/15/15   Dorena Dew, FNP  clopidogrel (PLAVIX) 75 MG tablet Take 1 tablet (75 mg total) by mouth daily. 01/06/16   Micheline Chapman, NP  fenofibrate (TRICOR) 145 MG tablet Take 1 tablet (145 mg total) by mouth daily. 01/06/16   Micheline Chapman, NP  ferrous sulfate 325 (65 FE) MG tablet TAKE 1 TABLET BY MOUTH DAILY WITH BREAKFAST 11/25/15   Micheline Chapman, NP  gabapentin (NEURONTIN) 400 MG capsule Take 1 capsule (400 mg total) by mouth 3 (three) times daily. 10/04/15   Dorena Dew, FNP  glucose blood (TRUE METRIX BLOOD GLUCOSE TEST) test strip Use as instructed 01/15/15   Dorena Dew, FNP  insulin glargine (LANTUS) 100 UNIT/ML injection Inject 0.2 mLs (20 Units total) into the skin daily. 01/06/16   Micheline Chapman, NP  Insulin Syringe-Needle U-100 (INSULIN SYRINGE .5CC/30GX1/2") 30G X 1/2" 0.5 ML MISC 100 11/25/15   Micheline Chapman, NP  Lancets (FREESTYLE) lancets Use as instructed 01/03/15   Reyne Dumas, MD  lisinopril (PRINIVIL,ZESTRIL) 20 MG tablet Take 1 tablet (20 mg total) by mouth daily. 01/06/16   Micheline Chapman, NP  ondansetron (ZOFRAN ODT) 4 MG disintegrating tablet Take 1 tablet (4 mg total) by mouth every 8 (eight) hours as needed for nausea or vomiting. 12/16/15   Merryl Hacker, MD  oxyCODONE-acetaminophen (  PERCOCET/ROXICET) 5-325 MG tablet Take 1 tablet by mouth every 6 (six) hours as needed for moderate pain or severe pain. 12/23/15   Jerrye Beavers, PA-C  saccharomyces boulardii (FLORASTOR) 250 MG capsule You can pick this up over the counter at any pharmacy. I would recommend starting now and continuing for at least 1-2 weeks after you finish all of your antibiotics. 12/23/15   Jerrye Beavers, PA-C     Family History  Problem Relation Age of Onset  . Diabetes Mother   . Hypertension  Mother   . Diabetes Maternal Grandmother   . Hypertension Maternal Grandmother   . Depression Maternal Grandmother     Social History   Social History  . Marital status: Single    Spouse name: N/A  . Number of children: N/A  . Years of education: N/A   Social History Main Topics  . Smoking status: Current Some Day Smoker    Packs/day: 0.50    Years: 8.00    Types: Cigarettes  . Smokeless tobacco: Never Used  . Alcohol use No  . Drug use: No     Comment: 3x wk  . Sexual activity: Not on file   Other Topics Concern  . Not on file   Social History Narrative  . No narrative on file      Review of Systems: A 12 point ROS discussed and pertinent positives are indicated in the HPI above.  All other systems are negative.  Review of Systems  Vital Signs: There were no vitals taken for this visit.  Physical Exam  Constitutional: He appears well-developed and well-nourished. No distress.  Abdominal: Soft. Bowel sounds are normal. He exhibits no distension and no mass. There is no tenderness. There is no guarding.  Right lower quadrant drain catheter site is clean, dry and intact.  Skin: He is not diaphoretic.     Imaging: Dg Chest 2 View  Result Date: 12/18/2015 CLINICAL DATA:  Abdominal pain.  Fever.  Postoperative state. EXAM: CHEST  2 VIEW COMPARISON:  05/09/2015. FINDINGS: Mediastinum hilar structures normal. Low lung volumes with mild bibasilar atelectasis and or infiltrates. No pleural effusion or pneumothorax. Heart size normal. Free intraperitoneal air is noted consistent recent surgery. Bowel distention noted suggesting adynamic ileus . IMPRESSION: 1. Low lung volumes with bibasilar atelectasis and/or infiltrates. 2. Free intraperitoneal air consistent with recent postoperative state. Distended loops of bowel, most likely adynamic ileus. Electronically Signed   By: Marcello Moores  Register   On: 12/18/2015 09:37   Dg Abdomen 1 View  Result Date: 12/15/2015 CLINICAL DATA:   Right-sided abdominal pain.  Nausea and vomiting. EXAM: ABDOMEN - 1 VIEW COMPARISON:  CT pelvis 07/28/2011 FINDINGS: The bowel gas pattern is normal. Small volume of colonic stool in the right colon. No radio-opaque calculi. Atherosclerosis is noted. Degenerative change of both hips. IMPRESSION: Normal bowel gas pattern. Electronically Signed   By: Jeb Levering M.D.   On: 12/15/2015 23:59   Ct Abdomen Pelvis W Contrast  Result Date: 01/08/2016 CLINICAL DATA:  Postop appendectomy with right lower quadrant and pelvic fluid collections. Status post percutaneous drain 12/21/2015. EXAM: CT ABDOMEN AND PELVIS WITH CONTRAST TECHNIQUE: Multidetector CT imaging of the abdomen and pelvis was performed using the standard protocol following bolus administration of intravenous contrast. CONTRAST:  163mL ISOVUE-300 IOPAMIDOL (ISOVUE-300) INJECTION 61% COMPARISON:  12/21/2015, 12/20/2015 FINDINGS: Lower chest: No acute abnormality. Hepatobiliary: No focal liver abnormality is seen. No gallstones, gallbladder wall thickening, or biliary dilatation. Pancreas: Unremarkable. No pancreatic  ductal dilatation or surrounding inflammatory changes. Spleen: Within normal limits in size. No focal abnormality. Accessory splenule noted. Adrenals/Urinary Tract: Adrenal glands are unremarkable. Kidneys are normal, without renal calculi, focal lesion, or hydronephrosis. Bladder is underdistended with a prominent wall. Stomach/Bowel: Negative for bowel obstruction, significant dilatation, or ileus. Previous free air has resolved. Right lower quadrant and pelvic fluid collections also resolved. Stable drain catheter position in the right lower quadrant at the appendectomy site. Drain catheter site fluid collection has also resolved. Inflammation and edema throughout the lower abdomen and pelvis has improved. Vascular/Lymphatic: Aortoiliac atherosclerosis noted. Negative for aneurysm. No adenopathy. Reproductive: Prostate is unremarkable.  Other: No inguinal abnormality or hernia.  Intact abdominal wall. Musculoskeletal: No acute or significant osseous findings. IMPRESSION: Resolved right lower quadrant and pelvic fluid collections. Stable drain catheter position at the appendectomy site. No new fluid collection or abscess. Resolved free air and ileus pattern.  Negative for obstruction. Aortoiliac atherosclerosis Electronically Signed   By: Jerilynn Mages.  Treazure Nery M.D.   On: 01/08/2016 10:44   Ct Abdomen Pelvis W Contrast  Result Date: 12/20/2015 CLINICAL DATA:  Inpatient. Status post appendectomy 4 days prior with persistent postoperative fever and leukocytosis. EXAM: CT ABDOMEN AND PELVIS WITH CONTRAST TECHNIQUE: Multidetector CT imaging of the abdomen and pelvis was performed using the standard protocol following bolus administration of intravenous contrast. CONTRAST:  197mL ISOVUE-300 IOPAMIDOL (ISOVUE-300) INJECTION 61% COMPARISON:  12/16/2015 CT abdomen/ pelvis. FINDINGS: Lower chest: New patchy consolidation and ground-glass opacity in the basilar right upper lobe, right middle lobe and right lower lobe. Separate segmental atelectasis in the medial lower lobe bases and lingula. Hepatobiliary: Normal liver with no liver mass. Normal gallbladder with no radiopaque cholelithiasis. No biliary ductal dilatation. Pancreas: Normal, with no mass or duct dilation. Spleen: Normal size. No mass. Adrenals/Urinary Tract: Normal adrenals. Normal kidneys with no hydronephrosis and no renal mass. Relatively collapsed bladder with the suggestion of new diffuse bladder wall thickening. Stomach/Bowel: Mild gastric dilatation with fluid filling most of the stomach and no appreciable gastric wall thickening. There is diffuse mild dilatation of the small bowel with fluid levels throughout the small bowel. Status post appendectomy. There is a 6.6 x 2.9 cm fluid collection in the right lower quadrant inferior to the appendectomy bed (series 2/ image 75), which demonstrates a  mildly thickened hyper enhancing wall. There is a smaller similar-appearing 3.3 x 1.9 cm anterior right lower quadrant fluid collection (series 2/ image 75). There is a 4.7 x 3.8 cm fluid collection in the midline deep pelvis (series 2/image 80) with mildly thickened enhancing wall. Diffuse mild dilatation of the right and transverse colon with colonic fluid levels and no large bowel wall thickening. Vascular/Lymphatic: Atherosclerotic nonaneurysmal abdominal aorta. Patent portal, splenic, hepatic and renal veins. No pathologically enlarged lymph nodes in the abdomen or pelvis. Reproductive: Normal size prostate. Other: There is a small amount of free air in the anterior upper peritoneal cavity, within normal recent postoperative limits. There is scattered free air throughout the extraperitoneal space anteriorly in the bilateral pelvis and lower abdomen. There is subcutaneous emphysema in the left groin and paraumbilical ventral abdominal wall. There is fat stranding throughout the mesenteric, lower omental and pelvic peritoneal and extraperitoneal fat. Musculoskeletal: No aggressive appearing focal osseous lesions. Moderate thoracolumbar spondylosis. Symmetric gynecomastia. IMPRESSION: 1. Three intraperitoneal fluid collections demonstrating mildly thickened enhancing walls, two located in the right lower quadrant inferior to the appendectomy bed and one located in the midline deep pelvis, suspicious for infected collections. 2.  CT findings demonstrate mild diffuse adynamic ileus of the small and large bowel. 3. New mild patchy consolidation and ground-glass opacity at the right lung base, which could indicate aspiration or pneumonia. Superimposed mild-to-moderate bibasilar atelectasis. 4. Suggestion of mild diffuse bladder wall thickening, probably reactive. 5. Aortic atherosclerosis. Electronically Signed   By: Ilona Sorrel M.D.   On: 12/20/2015 11:28   Ct Abdomen Pelvis W Contrast  Result Date:  12/16/2015 CLINICAL DATA:  Right lower quadrant pain for 2 days. EXAM: CT ABDOMEN AND PELVIS WITH CONTRAST TECHNIQUE: Multidetector CT imaging of the abdomen and pelvis was performed using the standard protocol following bolus administration of intravenous contrast. CONTRAST:  13mL ISOVUE-300 IOPAMIDOL (ISOVUE-300) INJECTION 61% COMPARISON:  Radiograph 12/15/2015 FINDINGS: Lower chest:  No acute findings. Hepatobiliary: No masses or other significant abnormality. Pancreas: No mass, inflammatory changes, or other significant abnormality. Spleen: Within normal limits in size and appearance. Adrenals/Urinary Tract: No masses identified. No evidence of hydronephrosis. Stomach/Bowel: No evidence of obstruction. The appendix is fluid-filled and dilated with indistinct borders. There is a 15 mm appendicolith at the appendiceal base. There is significant periappendiceal fat stranding with minimal amount of free fluid posterior to the inflamed appendix and within the right pericolic gutter. No definite evidence of abscess formation. Vascular/Lymphatic: No pathologically enlarged lymph nodes. No evidence of abdominal aortic aneurysm. Minimal atherosclerotic disease of the distal aorta and common iliac arteries. Reproductive: No mass or other significant abnormality. Other: None. Musculoskeletal:  No suspicious bone lesions identified. IMPRESSION: Acute appendicitis with indistinct appearance of the appendiceal wall and small amount of retro appendiceal fluid. Micro rupture cannot be excluded. Frank abscess formation is not seen. Minimal atherosclerotic disease of the distal aorta and common iliac arteries. Electronically Signed   By: Fidela Salisbury M.D.   On: 12/16/2015 13:08   Dg Sinus/fist Tube Chk-non Gi  Result Date: 01/08/2016 CLINICAL DATA:  Postop appendectomy right lower quadrant and pelvic fluid collections. EXAM: ABSCESS INJECTION CONTRAST:  5 cc Omnipaque 300 FLUOROSCOPY TIME:  Fluoroscopy Time:  18  seconds Radiation Exposure Index (if provided by the fluoroscopic device): 23 dGycm2 Number of Acquired Spot Images: 4 COMPARISON:  None. FINDINGS: Existing right lower quadrant abscess drain was injected with contrast under fluoroscopy. This confirms resolution of the abscess. No evidence of communication or fistula to the adjacent small bowel or cecum. IMPRESSION: Resolved abscess.  Negative for fistula. Electronically Signed   By: Jerilynn Mages.  Brailee Riede M.D.   On: 01/08/2016 11:11   Ct Image Guided Drainage By Percutaneous Catheter  Result Date: 12/21/2015 INDICATION: Right lower quadrant postop abscess EXAM: CT GUIDED DRAINAGE OF RIGHT LOWER QUADRANT ABSCESS MEDICATIONS: The patient is currently admitted to the hospital and receiving intravenous antibiotics. The antibiotics were administered within an appropriate time frame prior to the initiation of the procedure. ANESTHESIA/SEDATION: 1.0 mg IV Versed 50 mcg IV Fentanyl Moderate Sedation Time:  13 MINUTES The patient was continuously monitored during the procedure by the interventional radiology nurse under my direct supervision. COMPLICATIONS: None immediate. TECHNIQUE: Informed written consent was obtained from the patient after a thorough discussion of the procedural risks, benefits and alternatives. All questions were addressed. Maximal Sterile Barrier Technique was utilized including caps, mask, sterile gowns, sterile gloves, sterile drape, hand hygiene and skin antiseptic. A timeout was performed prior to the initiation of the procedure. PROCEDURE: Previous imaging reviewed. Patient positioned supine. Noncontrast localization CT performed. The right lower quadrant dominant abscess was localized. Overlying skin was marked. The right lower quadrant was prepped  with ChloraPrep in a sterile fashion, and a sterile drape was applied covering the operative field. A sterile gown and sterile gloves were used for the procedure. Local anesthesia was provided with 1%  Lidocaine. Under sterile conditions and local anesthesia, CT guidance was utilized to advance an 18 gauge 10 cm access needle from a lateral oblique approach into the dominant abscess. Needle position confirmed with CT. There was return of bloody exudative fluid. Sample sent for Gram stain and culture. Guidewire inserted followed by tract dilatation to insert a 12 [place] drain. Retention loop formed in the abscess cavity. Syringe aspiration yielded 30 cc bloody exudative fluid. Catheter position confirmed with CT. Catheter was secured with Prolene suture and connected to external suction bulb. Sterile dressing applied. No immediate complication. Patient tolerated the procedure well. FINDINGS: Imaging confirms needle insertion into the dominant right lower quadrant abdominal abscess for drain insertion IMPRESSION: Successful CT-guided right lower quadrant abdominal abscess drain insertion. Electronically Signed   By: Jerilynn Mages.  Nekia Maxham M.D.   On: 12/21/2015 11:37    Labs:  CBC:  Recent Labs  12/19/15 0603 12/20/15 0550 12/21/15 0523 12/23/15 0509  WBC 15.8* 13.7* 15.1* 13.1*  HGB 10.3* 10.5* 9.7* 10.0*  HCT 30.8* 30.9* 28.7* 30.3*  PLT 161 225 268 363    COAGS:  Recent Labs  05/09/15 1704 12/21/15 0523  INR 1.06 1.08  APTT 25  --     BMP:  Recent Labs  12/18/15 0603 12/19/15 0603 12/20/15 0550 12/21/15 0523  NA 131* 132* 132* 132*  K 4.4 5.0 4.4 3.8  CL 98* 99* 97* 97*  CO2 24 24 25 26   GLUCOSE 221* 279* 301* 250*  BUN 11 9 9 17   CALCIUM 8.0* 8.0* 8.3* 8.3*  CREATININE 1.21 1.21 1.13 1.47*  GFRNONAA >60 >60 >60 58*  GFRAA >60 >60 >60 >60    LIVER FUNCTION TESTS:  Recent Labs  10/04/15 1454 12/15/15 2202 12/16/15 1018 12/17/15 0554  BILITOT 0.4 0.7 0.7 1.2  AST 12 17 17 15   ALT 13 15* 17 13*  ALKPHOS 58 47 63 43  PROT 6.8 7.1 8.2* 6.5  ALBUMIN 4.2 4.3 5.0 3.8     Assessment and Plan:  Resolved right lower quadrant and pelvic postoperative abscesses/fluid  collections. Negative for fistula to the adjacent ileum or cecum. Patient is doing very well clinically.  Plan: Drain catheter will be removed today.   Electronically Signed: Greggory Keen 01/08/2016, 12:58 PM   I spent a total of      25 Minutes in face to face in clinical consultation, greater than 50% of which was counseling/coordinating care for postoperative right lower quadrant abscess.

## 2016-01-08 NOTE — Progress Notes (Signed)
Frank Schneider, is a 40 y.o. male  [id number]  [id number]  DOB - 01/15/1976  CC:  Chief Complaint  Patient presents with  . Follow-up    recent appendectomy and subsequent dinfection with drainage tube still in place, has CT scheduled for 9/13 to evaluate, feels a little lightheaded at times but says diabetes and HTN are WNL at home       HPI: Frank Schneider is a 40 y.o. male here to establish care. He recently was in hospital for appendicitis, had an infection and still has a drainage tube. He was instructed to come here to establish care. He has a history of a  CVA in November and again in February. He has a hsitory of HTN, DM, neuropathy, hyperlipidemia, anemia. He is on lisinopril, amlodipine, plavix, tricor and Lantus. He is needing refills. He reports feeling pretty well except for occassional lightedness. He reports avoiding sweets and eating more vegetables. He get no regular exercise. He smokes 3 cigarettes a day and occ marijuana.   No Known Allergies Past Medical History:  Diagnosis Date  . Blind right eye   . Diabetes mellitus    Takes Metformin  . Hypertension   . Priapism   . Stroke ([place])   . Tobacco use   . Vision abnormalities    Current Outpatient Prescriptions on File Prior to Visit  Medication Sig Dispense Refill  . Blood Glucose Monitoring Suppl (TRUE METRIX AIR GLUCOSE METER) DEVI 1 each by Does not apply route 2 (two) times daily at 10 AM and 5 PM. 1 Device 0  . ferrous sulfate 325 (65 FE) MG tablet TAKE 1 TABLET BY MOUTH DAILY WITH BREAKFAST 30 tablet 3  . gabapentin (NEURONTIN) 400 MG capsule Take 1 capsule (400 mg total) by mouth 3 (three) times daily. 90 capsule 1  . glucose blood (TRUE METRIX BLOOD GLUCOSE TEST) test strip Use as instructed 100 each 12  . Insulin Syringe-Needle U-100 (INSULIN SYRINGE .5CC/30GX1/2") 30G X 1/2" 0.5 ML MISC 100 100 each 0  . Lancets (FREESTYLE) lancets Use as instructed 100 each 12  . ondansetron (ZOFRAN ODT) 4  MG disintegrating tablet Take 1 tablet (4 mg total) by mouth every 8 (eight) hours as needed for nausea or vomiting. 20 tablet 0  . oxyCODONE-acetaminophen (PERCOCET/ROXICET) 5-325 MG tablet Take 1 tablet by mouth every 6 (six) hours as needed for moderate pain or severe pain. 30 tablet 0  . saccharomyces boulardii (FLORASTOR) 250 MG capsule You can pick this up over the counter at any pharmacy. I would recommend starting now and continuing for at least 1-2 weeks after you finish all of your antibiotics. 30 capsule 0   No current facility-administered medications on file prior to visit.    Family History  Problem Relation Age of Onset  . Diabetes Mother   . Hypertension Mother   . Diabetes Maternal Grandmother   . Hypertension Maternal Grandmother   . Depression Maternal Grandmother    Social History   Social History  . Marital status: Single    Spouse name: N/A  . Number of children: N/A  . Years of education: N/A   Occupational History  . Not on file.   Social History Main Topics  . Smoking status: Current Some Day Smoker    Packs/day: 0.50    Years: 8.00    Types: Cigarettes  . Smokeless tobacco: Never Used  . Alcohol use No  . Drug use: No     Comment: 3x wk  .  Sexual activity: Not on file   Other Topics Concern  . Not on file   Social History Narrative  . No narrative on file    Review of Systems: Constitutional: Negative, except for hot flashes,  Skin: Negative HENT: Positive for decreased visual acuity. Is blind in right eye Eyes: Negative  Neck: Negative Respiratory: Negative Cardiovascular: Negative Gastrointestinal: Negative Genitourinary: Negative  Musculoskeletal: Positive for low back pain Neurological: Positive for lighheadedness Hematological: Negative  Psychiatric/Behavioral: Positive for depression   Objective:   Vitals:   01/06/16 1506  BP: (!) 145/90  Pulse: 89  Temp: 98.2 F (36.8 C)    Physical Exam: Constitutional: Patient  appears well-developed and well-nourished. No distress. HENT: Normocephalic, atraumatic, External right and left ear normal. Oropharynx is clear and moist.  Eyes: Conjunctivae and EOM are normal. PERRLA, no scleral icterus. Neck: Normal ROM. Neck supple. No lymphadenopathy, No thyromegaly. CVS: RRR, S1/S2 +, no murmurs, no gallops, no rubs Pulmonary: Effort and breath sounds normal, no stridor, rhonchi, wheezes, rales.  Abdominal: Soft. Normoactive BS,, no distension, tenderness, rebound or guarding. Drain in place with serosang drainage. Musculoskeletal: Normal range of motion. No edema and no tenderness.  Neuro: Alert.Normal muscle tone coordination. Non-focal Skin: Skin is warm and dry. No rash noted. Not diaphoretic. No erythema. No pallor. Psychiatric: Normal mood and affect. Behavior, judgment, thought content normal.  Lab Results  Component Value Date   WBC 13.1 (H) 12/23/2015   HGB 10.0 (L) 12/23/2015   HCT 30.3 (L) 12/23/2015   MCV 57.5 (L) 12/23/2015   PLT 363 12/23/2015   Lab Results  Component Value Date   CREATININE 1.47 (H) 12/21/2015   BUN 17 12/21/2015   NA 132 (L) 12/21/2015   K 3.8 12/21/2015   CL 97 (L) 12/21/2015   CO2 26 12/21/2015    Lab Results  Component Value Date   HGBA1C 8.4 (H) 12/20/2015   Lipid Panel     Component Value Date/Time   CHOL 115 05/10/2015 0353   TRIG 211 (H) 05/10/2015 0353   HDL 18 (L) 05/10/2015 0353   CHOLHDL 6.4 05/10/2015 0353   VLDL 42 (H) 05/10/2015 0353   LDLCALC 55 05/10/2015 0353       Assessment and plan:   1. History of CVA (cerebrovascular accident)  - clopidogrel (PLAVIX) 75 MG tablet; Take 1 tablet (75 mg total) by mouth daily.  Dispense: 30 tablet; Refill: 2  2. Uncontrolled type 2 diabetes mellitus with complication, with long-term current use of insulin (HCC)  - insulin glargine (LANTUS) 100 UNIT/ML injection; Inject 0.2 mLs (20 Units total) into the skin daily.  Dispense: 30 mL; Refill: 3 -  fenofibrate (TRICOR) 145 MG tablet; Take 1 tablet (145 mg total) by mouth daily.  Dispense: 90 tablet; Refill: 1 - Ambulatory referral to Ophthalmology  3. Essential hypertension  - lisinopril (PRINIVIL,ZESTRIL) 20 MG tablet; Take 1 tablet (20 mg total) by mouth daily.  Dispense: 90 tablet; Refill: 1 - amLODipine (NORVASC) 10 MG tablet; Take 1 tablet (10 mg total) by mouth daily.  Dispense: 30 tablet; Refill: 2  4. Hyperlipidemia - fenofibrate (TRICOR) 145 MG tablet; Take 1 tablet (145 mg total) by mouth daily.  Dispense: 90 tablet; Refill: 1    Return in about 3 months (around 04/06/2016).  The patient was given clear instructions to go to ER or return to medical center if symptoms don't improve, worsen or new problems develop. The patient verbalized understanding.    Micheline Chapman FNP  01/08/2016, 1:06 PM

## 2016-02-03 ENCOUNTER — Other Ambulatory Visit: Payer: Self-pay | Admitting: Family Medicine

## 2016-02-03 ENCOUNTER — Encounter: Payer: Self-pay | Admitting: Family Medicine

## 2016-02-03 DIAGNOSIS — E118 Type 2 diabetes mellitus with unspecified complications: Principal | ICD-10-CM

## 2016-02-03 DIAGNOSIS — E1165 Type 2 diabetes mellitus with hyperglycemia: Secondary | ICD-10-CM

## 2016-02-03 DIAGNOSIS — Z794 Long term (current) use of insulin: Principal | ICD-10-CM

## 2016-02-03 DIAGNOSIS — IMO0002 Reserved for concepts with insufficient information to code with codable children: Secondary | ICD-10-CM

## 2016-02-03 LAB — HM DIABETES EYE EXAM

## 2016-02-03 MED FILL — LISINOPRIL 20 MG TABLET: 20 | 30 days supply | Qty: 30 | Fill #0

## 2016-02-03 MED FILL — CLOPIDOGREL 75 MG TABLET: 75 | 30 days supply | Qty: 30 | Fill #0

## 2016-02-03 MED FILL — TRAVATAN Z 0.004% EYE DROP: 0.004 | 45 days supply | Qty: 3 | Fill #0

## 2016-02-03 MED FILL — AMLODIPINE BESYLATE 10 MG T: 10 | 30 days supply | Qty: 30 | Fill #0

## 2016-02-03 MED FILL — FENOFIBRATE 145 MG TABLET: 145 | 30 days supply | Qty: 30 | Fill #1

## 2016-02-03 MED FILL — $LANTUS 100 UNITS/ML VIAL: 100 | 50 days supply | Qty: 10 | Fill #0

## 2016-02-03 MED FILL — SIMBRINZA 1%-0.2% EYE DROPS: 1-0.2 | 60 days supply | Qty: 8 | Fill #0

## 2016-02-03 MED FILL — FERROUS SULFATE 325 MG TAB: 325 (65 FE) | 30 days supply | Qty: 30 | Fill #2

## 2016-02-05 MED FILL — GABAPENTIN 400 MG CAPSULE: 400 | 30 days supply | Qty: 90 | Fill #1

## 2016-02-06 ENCOUNTER — Telehealth: Payer: Self-pay

## 2016-02-06 NOTE — Telephone Encounter (Signed)
Spoke with patient, patient needs authorization to be seen for retina specialists. I called [facility] and am faxing a letter giving them permission to treat patient. Thanks!

## 2016-03-06 MED FILL — FENOFIBRATE 145 MG TABLET: 145 | 29 days supply | Qty: 29 | Fill #2

## 2016-03-06 MED FILL — LISINOPRIL 20 MG TABLET: 20 | 30 days supply | Qty: 30 | Fill #1

## 2016-03-06 MED FILL — CLOPIDOGREL 75 MG TABLET: 75 | 30 days supply | Qty: 30 | Fill #1

## 2016-03-06 MED FILL — AMLODIPINE BESYLATE 10 MG T: 10 | 30 days supply | Qty: 30 | Fill #1

## 2016-03-06 MED FILL — FERROUS SULFATE 325 MG TAB: 325 (65 FE) | 30 days supply | Qty: 30 | Fill #3

## 2016-03-31 MED FILL — $LANTUS 100 UNITS/ML VIAL: 100 | 50 days supply | Qty: 10 | Fill #0

## 2016-04-06 ENCOUNTER — Ambulatory Visit (INDEPENDENT_AMBULATORY_CARE_PROVIDER_SITE_OTHER): Payer: Medicaid Other | Admitting: Family Medicine

## 2016-04-06 ENCOUNTER — Encounter: Payer: Self-pay | Admitting: Family Medicine

## 2016-04-06 VITALS — BP 126/69 | HR 84 | Temp 98.6°F | Resp 16 | Ht 70.5 in | Wt 199.0 lb

## 2016-04-06 DIAGNOSIS — Z8673 Personal history of transient ischemic attack (TIA), and cerebral infarction without residual deficits: Secondary | ICD-10-CM | POA: Diagnosis not present

## 2016-04-06 DIAGNOSIS — E118 Type 2 diabetes mellitus with unspecified complications: Secondary | ICD-10-CM

## 2016-04-06 DIAGNOSIS — Z794 Long term (current) use of insulin: Secondary | ICD-10-CM | POA: Diagnosis not present

## 2016-04-06 DIAGNOSIS — L84 Corns and callosities: Secondary | ICD-10-CM | POA: Diagnosis not present

## 2016-04-06 DIAGNOSIS — G629 Polyneuropathy, unspecified: Secondary | ICD-10-CM

## 2016-04-06 DIAGNOSIS — Z720 Tobacco use: Secondary | ICD-10-CM

## 2016-04-06 DIAGNOSIS — R531 Weakness: Secondary | ICD-10-CM

## 2016-04-06 DIAGNOSIS — E1165 Type 2 diabetes mellitus with hyperglycemia: Secondary | ICD-10-CM

## 2016-04-06 DIAGNOSIS — IMO0002 Reserved for concepts with insufficient information to code with codable children: Secondary | ICD-10-CM

## 2016-04-06 LAB — POCT URINALYSIS DIP (DEVICE)
Bilirubin Urine: NEGATIVE
GLUCOSE, UA: NEGATIVE mg/dL
Hgb urine dipstick: NEGATIVE
Ketones, ur: NEGATIVE mg/dL
Nitrite: NEGATIVE
PH: 5.5 (ref 5.0–8.0)
PROTEIN: 30 mg/dL — AB
UROBILINOGEN UA: 0.2 mg/dL (ref 0.0–1.0)

## 2016-04-06 LAB — COMPLETE METABOLIC PANEL WITH GFR
ALT: 12 U/L (ref 9–46)
AST: 13 U/L (ref 10–40)
Albumin: 4.6 g/dL (ref 3.6–5.1)
Alkaline Phosphatase: 47 U/L (ref 40–115)
BILIRUBIN TOTAL: 0.5 mg/dL (ref 0.2–1.2)
BUN: 12 mg/dL (ref 7–25)
CO2: 28 mmol/L (ref 20–31)
CREATININE: 1.1 mg/dL (ref 0.60–1.35)
Calcium: 9.5 mg/dL (ref 8.6–10.3)
Chloride: 103 mmol/L (ref 98–110)
GFR, Est African American: >89 mL/min (ref >=60)
GFR, Est Non African American: 84 mL/min (ref >=60)
GLUCOSE: 161 mg/dL — AB (ref 65–99)
Potassium: 4.7 mmol/L (ref 3.5–5.3)
SODIUM: 138 mmol/L (ref 135–146)
TOTAL PROTEIN: 6.9 g/dL (ref 6.1–8.1)

## 2016-04-06 LAB — CBC WITH DIFFERENTIAL/PLATELET
BASOS ABS: 0 {cells}/uL (ref 0–200)
Basophils Relative: 0 %
EOS PCT: 2 %
Eosinophils Absolute: 212 cells/uL (ref 15–500)
HCT: 41.9 % (ref 38.5–50.0)
HEMOGLOBIN: 13.2 g/dL (ref 13.2–17.1)
Lymphocytes Relative: 23 %
Lymphs Abs: 2438 cells/uL (ref 850–3900)
MCH: 20 pg — AB (ref 27.0–33.0)
MCHC: 31.5 g/dL — AB (ref 32.0–36.0)
MCV: 63.4 fL — AB (ref 80.0–100.0)
MONOS PCT: 5 %
Monocytes Absolute: 530 cells/uL (ref 200–950)
NEUTROS PCT: 70 %
Neutro Abs: 7420 cells/uL (ref 1500–7800)
Platelets: 191 10*3/uL (ref 140–400)
RBC: 6.61 MIL/uL — ABNORMAL HIGH (ref 4.20–5.80)
RDW: 17.5 % — ABNORMAL HIGH (ref 11.0–15.0)
WBC: 10.6 10*3/uL (ref 3.8–10.8)

## 2016-04-06 LAB — POCT GLYCOSYLATED HEMOGLOBIN (HGB A1C): HEMOGLOBIN A1C: 6.9

## 2016-04-06 MED ORDER — PREGABALIN 100 MG PO CAPS: 100.0000 mg | ORAL_CAPSULE | Freq: Two times a day (BID) | ORAL | 0 refills | Status: DC

## 2016-04-06 NOTE — Patient Instructions (Addendum)
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  Follow up in 1 month for neuropathy.  Diabetes and Foot Care Diabetes may cause you to have problems because of poor blood supply (circulation) to your feet and legs. This may cause the skin on your feet to become thinner, break easier, and heal more slowly. Your skin may become dry, and the skin may peel and crack. You may also have nerve damage in your legs and feet causing decreased feeling in them. You may not notice minor injuries to your feet that could lead to infections or more serious problems. Taking care of your feet is one of the most important things you can do for yourself. Follow these instructions at home:  Wear shoes at all times, even in the house. Do not go barefoot. Bare feet are easily injured.  Check your feet daily for blisters, cuts, and redness. If you cannot see the bottom of your feet, use a mirror or ask someone for help.  Wash your feet with warm water (do not use hot water) and mild soap. Then pat your feet and the areas between your toes until they are completely dry. Do not soak your feet as this can dry your skin.  Apply a moisturizing lotion or petroleum jelly (that does not contain alcohol and is unscented) to the skin on your feet and to dry, brittle toenails. Do not apply lotion between your toes.  Trim your toenails straight across. Do not dig under them or around the cuticle. File the edges of your nails with an emery board or nail file.  Do not cut corns or calluses or try to remove them with medicine.  Wear clean socks or stockings every day. Make sure they are not too tight. Do not wear knee-high stockings since they may decrease blood flow to your legs.  Wear shoes that fit properly and have enough cushioning. To break in new  shoes, wear them for just a few hours a day. This prevents you from injuring your feet. Always look in your shoes before you put them on to be sure there are no objects inside.  Do not cross your legs. This may decrease the blood flow to your feet.  If you find a minor scrape, cut, or break in the skin on your feet, keep it and the skin around it clean and dry. These areas may be cleansed with mild soap and water. Do not cleanse the area with peroxide, alcohol, or iodine.  When you remove an adhesive bandage, be sure not to damage the skin around it.  If you have a wound, look at it several times a day to make sure it is healing.  Do not use heating pads or hot water bottles. They may burn your skin. If you have lost feeling in your feet or legs, you may not know it is happening until it is too late.  Make sure your health care provider performs a complete foot exam at least annually or more often if you have foot problems. Report any cuts, sores, or bruises to your health care provider immediately. Contact a health care provider if:  You have an injury that is not healing.  You have cuts or breaks in the skin.  You have an ingrown nail.  You notice redness on your legs or feet.  You feel burning or tingling in your legs or feet.  You have pain or cramps in your legs and feet.  Your legs or feet are numb.  Your feet always feel cold. Get help right away if:  There is increasing redness, swelling, or pain in or around a wound.  There is a red line that goes up your leg.  Pus is coming from a wound.  You develop a fever or as directed by your health care provider.  You notice a bad smell coming from an ulcer or wound. This information is not intended to replace advice given to you by your health care provider. Make sure you discuss any questions you have with your health care provider. Document Released: 04/10/2000 Document Revised: 09/19/2015 Document Reviewed:  09/20/2012 Elsevier Interactive Patient Education  2017 [place].  How to Avoid Diabetes Mellitus Problems You can take action to prevent or slow down problems that are caused by diabetes (diabetes mellitus). Following your diabetes plan and taking care of yourself can reduce your risk of serious or life-threatening complications. Manage your diabetes  Follow instructions from your health care providers about managing your diabetes. Your diabetes may be managed by a team of health care providers who can teach you how to care for yourself and can answer questions that you have.  Educate yourself about your condition so you can make healthy choices about eating and physical activity.  Check your blood sugar (glucose) levels as often as directed. Your health care provider will help you decide how often to check your blood glucose level depending on your treatment goals and how well you are meeting them.  Ask your health care provider if you should take low-dose aspirin daily and what dose is recommended for you. Taking low-dose aspirin daily is recommended to help prevent cardiovascular disease. Do not use nicotine or tobacco Do not use any products that contain nicotine or tobacco, such as cigarettes and e-cigarettes. If you need help quitting, ask your health care provider. Nicotine raises your risk for diabetes problems. If you quit using nicotine:  You will lower your risk for heart attack, stroke, nerve disease, and kidney disease.  Your cholesterol and blood pressure may improve.  Your blood circulation will improve. Keep your blood pressure under control To control your blood pressure:  Follow instructions from your health care provider about meal planning, exercise, and medicines.  Make sure your health care provider checks your blood pressure at every medical visit. A blood pressure reading consists of two numbers. Generally, the goal is to keep your top number (systolic  pressure) at or below 140, and your bottom number (diastolic pressure) at or below 90. Your health care provider may recommend a lower target blood pressure. Your individualized target blood pressure is determined based on:  Your age.  Your medicines.  How long you have had diabetes.  Any other medical conditions you have. Keep your cholesterol under control To control your cholesterol:  Follow instructions from your health care provider about meal planning, exercise, and medicines.  Have your cholesterol checked at least once a year.  You may be prescribed medicine to lower cholesterol (statin). If you are not taking a statin, ask your health care provider if you should be. Controlling your cholesterol may:  Help prevent heart disease and stroke. These are the most common health problems for people with diabetes.  Improve your blood flow. Schedule and keep yearly physical exams and eye exams Your health care provider will  tell you how often you need medical visits depending on your diabetes management plan. Keep all follow-up visits as directed. This is important so possible problems can be identified early and complications can be avoided or treated.  Every visit with your health care provider should include measuring your:  Weight.  Blood pressure.  Blood glucose control.  Your A1c (hemoglobin A1c) level should be checked:  At least 2 times a year, if you are meeting your treatment goals.  4 times a year, if you are not meeting treatment goals or if your treatment goals have changed.  Your blood lipids (lipid profile) should be checked yearly. You should also be checked yearly for protein in your urine (urine microalbumin).  If you have type 1 diabetes, get an eye exam 3-5 years after you are diagnosed, and then once a year after your first exam.  If you have type 2 diabetes, get an eye exam as soon as you are diagnosed, and then once a year after your first exam. Keep  your vaccines current It is recommended that you receive:  A flu (influenza) vaccine every year.  A pneumonia (pneumococcal) vaccine and a hepatitis B vaccine. If you are age 71 or older, you may get the pneumonia vaccine as a series of two separate shots. Ask your health care provider which other vaccines may be recommended. Take care of your feet Diabetes may cause you to have poor blood circulation to your legs and feet. Because of this, taking care of your feet is very important. Diabetes can cause:  The skin on the feet to get thinner, break more easily, and heal more slowly.  Nerve damage in your legs and feet, which results in decreased feeling. You may not notice minor injuries that could lead to serious problems. To avoid foot problems:  Check your skin and feet every day for cuts, bruises, redness, blisters, or sores.  Schedule a foot exam with your health care provider once every year. This exam includes:  Inspecting of the structure and skin of your feet.  Checking the pulses and sensation in your feet.  Make sure that your health care provider performs a visual foot exam at every medical visit. Take care of your teeth People with poorly controlled diabetes are more likely to have gum (periodontal) disease. Diabetes can make periodontal diseases harder to control. If not treated, periodontal diseases can lead to tooth loss. To prevent this:  Brush your teeth twice a day.  Floss at least once a day.  Visit your dentist 2 times a year. Drink responsibly Limit alcohol intake to no more than 1 drink a day for nonpregnant women and 2 drinks a day for men. One drink equals 12 oz of beer, 5 oz of wine, or 1 oz of hard liquor. It is important to eat food when you drink alcohol to avoid low blood glucose (hypoglycemia). Avoid alcohol if you:  Have a history of alcohol abuse or dependence.  Are pregnant.  Have liver disease, pancreatitis, advanced neuropathy, or severe  hypertriglyceridemia. Lessen stress Living with diabetes can be stressful. When you are experiencing stress, your blood glucose may be affected in two ways:  Stress hormones may cause your blood glucose to rise.  You may be distracted from taking good care of yourself. Be aware of your stress level and make changes to help you manage challenging situations. To lower your stress levels:  Consider joining a support group.  Do planned relaxation or meditation.  Do  a hobby that you enjoy.  Maintain healthy relationships.  Exercise regularly.  Work with your health care provider or a mental health professional. Summary  You can take action to prevent or slow down problems that are caused by diabetes (diabetes mellitus). Following your diabetes plan and taking care of yourself can reduce your risk of serious or life-threatening complications.  Follow instructions from your health care providers about managing your diabetes. Your diabetes may be managed by a team of health care providers who can teach you how to care for yourself and can answer questions that you have.  Your health care provider will tell you how often you need medical visits depending on your diabetes management plan. Keep all follow-up visits as directed. This is important so possible problems can be identified early and complications can be avoided or treated. This information is not intended to replace advice given to you by your health care provider. Make sure you discuss any questions you have with your health care provider. Document Released: 12/30/2010 Document Revised: 01/11/2016 Document Reviewed: 01/11/2016 Elsevier Interactive Patient Education  2017 [place].  Diabetic Neuropathy Diabetic neuropathy is a nerve disease or nerve damage that is caused by diabetes mellitus. About half of all people with diabetes mellitus have some form of nerve damage. Nerve damage is more common in those who have had diabetes  mellitus for many years and who generally have not had good control of their blood sugar (glucose) level. Diabetic neuropathy is a common complication of diabetes mellitus. There are three common types of diabetic neuropathy and a fourth type that is less common and less understood:  Peripheral neuropathy-This is the most common type of diabetic neuropathy. It causes damage to the nerves of the feet and legs first and then eventually the hands and arms. The damage affects the ability to sense touch.  Autonomic neuropathy-This type causes damage to the autonomic nervous system, which controls the following functions:  Heartbeat.  Body temperature.  Blood pressure.  Urination.  Digestion.  Sweating.  Sexual function.  Focal neuropathy-Focal neuropathy can be painful and unpredictable and occurs most often in older adults with diabetes mellitus. It involves a specific nerve or one area and often comes on suddenly. It usually does not cause long-term problems.  Radiculoplexus neuropathy- Sometimes called lumbosacral radiculoplexus neuropathy, radiculoplexus neuropathy affects the nerves of the thighs, hips, buttocks, or legs. It is more common in people with type 2 diabetes mellitus and in older men. It is characterized by debilitating pain, weakness, and atrophy, usually in the thigh muscles. What are the causes? The cause of peripheral, autonomic, and focal neuropathies is diabetes mellitus that is uncontrolled and high glucose levels. The cause of radiculoplexus neuropathy is unknown. However, it is thought to be caused by inflammation related to uncontrolled glucose levels. What are the signs or symptoms? Peripheral Neuropathy  Peripheral neuropathy develops slowly over time. When the nerves of the feet and legs no longer work there may be:  Burning, stabbing, or aching pain in the legs or feet.  Inability to feel pressure or pain in your feet. This can lead to:  Thick calluses over  pressure areas.  Pressure sores.  Ulcers.  Foot deformities.  Reduced ability to feel temperature changes.  Muscle weakness. Autonomic Neuropathy  The symptoms of autonomic neuropathy vary depending on which nerves are affected. Symptoms may include:  Problems with digestion, such as:  Feeling sick to your stomach (nausea).  Vomiting.  Bloating.  Constipation.  Diarrhea.  Abdominal pain.  Difficulty with urination. This occurs if you lose your ability to sense when your bladder is full. Problems include:  Urine leakage (incontinence).  Inability to empty your bladder completely (retention).  Rapid or irregular heartbeat (palpitations).  Blood pressure drops when you stand up (orthostatic hypotension). When you stand up you may feel:  Dizzy.  Weak.  Faint.  In men, inability to attain and maintain an erection.  In women, vaginal dryness and problems with decreased sexual desire and arousal.  Problems with body temperature regulation.  Increased or decreased sweating. Focal Neuropathy  Abnormal eye movements or abnormal alignment of both eyes.  Weakness in the wrist.  Foot drop. This results in an inability to lift the foot properly and abnormal walking or foot movement.  Paralysis on one side of your face (Bell palsy).  Chest or abdominal pain. Radiculoplexus Neuropathy  Sudden, severe pain in your hip, thigh, or buttocks.  Weakness and wasting of thigh muscles.  Difficulty rising from a seated position.  Abdominal swelling.  Unexplained weight loss (usually more than 10 lb [4.5 kg]). How is this diagnosed? Peripheral Neuropathy  Your senses may be tested. Sensory function testing can be done with:  A light touch using a monofilament.  A vibration with tuning fork.  A sharp sensation with a pin prick. Other tests that can help diagnose neuropathy are:  Nerve conduction velocity. This test checks the transmission of an electrical  current through a nerve.  Electromyography. This shows how muscles respond to electrical signals transmitted by nearby nerves.  Quantitative sensory testing. This is used to assess how your nerves respond to vibrations and changes in temperature. Autonomic Neuropathy  Diagnosis is often based on reported symptoms. Tell your health care provider if you experience:  Dizziness.  Constipation.  Diarrhea.  Inappropriate urination or inability to urinate.  Inability to get or maintain an erection. Tests that may be done include:  Electrocardiography or Holter monitor. These are tests that can help show problems with the heart rate or heart rhythm.  An X-ray exam may be done. Focal Neuropathy  Diagnosis is made based on your symptoms and what your health care provider finds during your exam. Other tests may be done. They may include:  Nerve conduction velocities. This checks the transmission of electrical current through a nerve.  Electromyography. This shows how muscles respond to electrical signals transmitted by nearby nerves.  Quantitative sensory testing. This test is used to assess how your nerves respond to vibration and changes in temperature. Radiculoplexus Neuropathy  Often the first thing is to eliminate any other issue or problems that might be the cause, as there is no standard test for diagnosis.  X-ray exam of your spine and lumbar region.  Spinal tap to rule out cancer.  MRI to rule out other lesions. How is this treated? Once nerve damage occurs, it cannot be reversed. The goal of treatment is to keep the disease or nerve damage from getting worse and affecting more nerve fibers. Controlling your blood glucose level is the key. Most people with radiculoplexus neuropathy see at least a partial improvement over time. You will need to keep your blood glucose and HbA1c levels in the target range determined by your health care provider. Things that help control blood  glucose levels include:  Blood glucose monitoring.  Meal planning.  Physical activity.  Diabetes medicine. Over time, maintaining lower blood glucose levels helps lessen symptoms. Sometimes, prescription pain medicine is needed. Follow these instructions  at home:  Do not smoke.  Keep your blood glucose level in the range that you and your health care provider have determined acceptable for you.  Keep your blood pressure level in the range that you and your health care provider have determined acceptable for you.  Eat a well-balanced diet.  Be physically active every day. Include strength training and balance exercises.  Protect your feet.  Check your feet every day for sores, cuts, blisters, or signs of infection.  Wear padded socks and supportive shoes. Use orthotic inserts, if necessary.  Regularly check the insides of your shoes for worn spots. Make sure there are no rocks or other items inside your shoes before you put them on. Contact a health care provider if:  You have burning, stabbing, or aching pain in the legs or feet.  You are unable to feel pressure or pain in your feet.  You develop problems with digestion such as:  Nausea.  Vomiting.  Bloating.  Constipation.  Diarrhea.  Abdominal pain.  You have difficulty with urination, such as:  Incontinence.  Retention.  You have palpitations.  You develop orthostatic hypotension. When you stand up you may feel:  Dizzy.  Weak.  Faint.  You cannot attain and maintain an erection (in men).  You have vaginal dryness and problems with decreased sexual desire and arousal (in women).  You have severe pain in your thighs, legs, or buttocks.  You have unexplained weight loss. This information is not intended to replace advice given to you by your health care provider. Make sure you discuss any questions you have with your health care provider. Document Released: 06/22/2001 Document Revised:  09/19/2015 Document Reviewed: 09/22/2012 Elsevier Interactive Patient Education  2017 [employer].

## 2016-04-06 NOTE — Progress Notes (Signed)
Subjective:    Patient ID: Frank Schneider, male    DOB: May 11, 1975, 40 y.o.   MRN: [id number]  Hypertension  This is a chronic problem. The current episode started more than 1 month ago. The problem is unchanged. The problem is controlled. Pertinent negatives include no anxiety, blurred vision, chest pain, malaise/fatigue, neck pain, orthopnea, palpitations, peripheral edema, PND, shortness of breath or sweats. Risk factors for coronary artery disease include diabetes mellitus, male gender and smoking/tobacco exposure. Past treatments include ACE inhibitors and calcium channel blockers. The current treatment provides significant improvement. There are no compliance problems.  There is no history of heart failure or left ventricular hypertrophy.  Diabetes  He presents for his follow-up diabetic visit. He has type 2 diabetes mellitus. His disease course has been stable. Pertinent negatives for hypoglycemia include no confusion, dizziness, nervousness/anxiousness, pallor, seizures, sleepiness, speech difficulty, sweats or tremors. Associated symptoms include foot paresthesias and weakness. Pertinent negatives for diabetes include no blurred vision, no chest pain, no fatigue, no foot ulcerations, no polydipsia, no polyphagia, no polyuria and no visual change. Pertinent negatives for hypoglycemia complications include no blackouts, no nocturnal hypoglycemia and no required glucagon injection. Symptoms are stable. Risk factors for coronary artery disease include hypertension and male sex. Current diabetic treatment includes insulin injections. He is compliant with treatment all of the time. He is currently taking insulin at bedtime. Insulin injections are given by patient. Rotation sites for injection include the abdominal wall. His weight is stable. He is following a diabetic diet. He has not had a previous visit with a dietitian. He never participates in exercise. His breakfast blood glucose is taken between  9-10 am. His breakfast blood glucose range is generally 130-140 mg/dl. An ACE inhibitor/angiotensin II receptor blocker is being taken. He does not see a podiatrist.Eye exam is current.  Foot Pain  The current episode started more than 1 month ago. The problem occurs constantly. The problem has been gradually worsening. Associated symptoms include numbness (left side) and weakness. Pertinent negatives include no anorexia, change in bowel habit, chest pain, fatigue, fever, nausea, neck pain, rash or visual change. The symptoms are aggravated by walking. He has tried NSAIDs and immobilization for the symptoms. The treatment provided no relief.    Past Medical History:  Diagnosis Date  . Blind right eye   . Diabetes mellitus    Takes Metformin  . Hypertension   . Priapism   . Stroke ([place])   . Tobacco use   . Vision abnormalities      Medication List       Accurate as of 04/06/16  1:44 PM. Always use your most recent med list.          amLODipine 10 MG tablet Commonly known as:  NORVASC Take 1 tablet (10 mg total) by mouth daily.   clopidogrel 75 MG tablet Commonly known as:  PLAVIX Take 1 tablet (75 mg total) by mouth daily.   fenofibrate 145 MG tablet Commonly known as:  TRICOR Take 1 tablet (145 mg total) by mouth daily.   ferrous sulfate 325 (65 FE) MG tablet TAKE 1 TABLET BY MOUTH DAILY WITH BREAKFAST   freestyle lancets Use as instructed   gabapentin 400 MG capsule Commonly known as:  NEURONTIN Take 1 capsule (400 mg total) by mouth 3 (three) times daily.   glucose blood test strip Commonly known as:  TRUE METRIX BLOOD GLUCOSE TEST Use as instructed   INSULIN SYRINGE .5CC/30GX1/2" 30G X 1/2" 0.5 ML  Misc 100   LANTUS 100 UNIT/ML injection Generic drug:  insulin glargine INJECT 20 UNITS INTO THE SKIN AT BEDTIME.   lisinopril 20 MG tablet Commonly known as:  PRINIVIL,ZESTRIL Take 1 tablet (20 mg total) by mouth daily.   ondansetron 4 MG disintegrating  tablet Commonly known as:  ZOFRAN ODT Take 1 tablet (4 mg total) by mouth every 8 (eight) hours as needed for nausea or vomiting.   oxyCODONE-acetaminophen 5-325 MG tablet Commonly known as:  PERCOCET/ROXICET Take 1 tablet by mouth every 6 (six) hours as needed for moderate pain or severe pain.   saccharomyces boulardii 250 MG capsule Commonly known as:  FLORASTOR You can pick this up over the counter at any pharmacy. I would recommend starting now and continuing for at least 1-2 weeks after you finish all of your antibiotics.   TRUE METRIX AIR GLUCOSE METER Devi 1 each by Does not apply route 2 (two) times daily at 10 AM and 5 PM.      Immunization History  Administered Date(s) Administered  . Influenza,inj,Quad PF,36+ Mos 01/15/2015  . Pneumococcal Polysaccharide-23 07/29/2011  . Tdap 01/15/2015  No Known Allergies Review of Systems  Constitutional: Negative for fatigue, fever, malaise/fatigue and unexpected weight change.  Eyes: Negative for blurred vision and photophobia.  Respiratory: Negative for chest tightness and shortness of breath.   Cardiovascular: Negative.  Negative for chest pain, palpitations, orthopnea and PND.  Gastrointestinal: Negative.  Negative for anorexia, change in bowel habit, constipation, diarrhea and nausea.  Endocrine: Negative for cold intolerance, heat intolerance, polydipsia, polyphagia and polyuria.  Genitourinary: Negative for decreased urine volume, frequency, testicular pain and urgency.  Musculoskeletal: Negative.  Negative for neck pain.  Skin: Negative.  Negative for pallor, rash and wound.  Allergic/Immunologic: Negative for immunocompromised state.  Neurological: Positive for weakness and numbness (left side). Negative for dizziness, tremors, seizures, facial asymmetry and speech difficulty.  Hematological: Negative.   Psychiatric/Behavioral: Negative.  Negative for confusion, sleep disturbance and suicidal ideas. The patient is not  nervous/anxious.        Objective:   Physical Exam  Constitutional: He is oriented to person, place, and time. He appears well-developed and well-nourished.  HENT:  Head: Normocephalic.  Right Ear: Hearing, tympanic membrane, external ear and ear canal normal.  Left Ear: Hearing, tympanic membrane, external ear and ear canal normal.  Nose: Nose normal.  Mouth/Throat: Uvula is midline, oropharynx is clear and moist and mucous membranes are normal.  Eyes: Conjunctivae, EOM and lids are normal. Right pupil is reactive.  Fundoscopic exam:      The right eye shows no red reflex.       The left eye shows red reflex.    Neck: Trachea normal and normal range of motion. Neck supple. No neck rigidity. Normal range of motion present.  Cardiovascular: Normal rate, regular rhythm, S1 normal, S2 normal, normal heart sounds, intact distal pulses and normal pulses.   Pulmonary/Chest: Effort normal and breath sounds normal.  Abdominal: Soft. Normal appearance and bowel sounds are normal.  Musculoskeletal: Normal range of motion.  Neurological: He is alert and oriented to person, place, and time. He has normal reflexes. He is not disoriented. No cranial nerve deficit. He exhibits normal muscle tone.  Monofilament examination within normal limits  Skin: Skin is warm, dry and intact.  Toenails thickened with mild hyperpigmentation Left foot callous, 0.5 cm, round, raised, and rough to palpation.   Psychiatric: He has a normal mood and affect. His speech is normal  and behavior is normal. Judgment and thought content normal. Cognition and memory are normal.      BP 126/69 (BP Location: Left Arm, Patient Position: Sitting, Cuff Size: Normal)   Pulse 84   Temp 98.6 F (37 C) (Oral)   Resp 16   Ht 5' 10.5" (1.791 m)   Wt 199 lb (90.3 kg)   SpO2 100%   BMI 28.15 kg/m  Assessment & Plan:  1. Type 2 diabetes mellitus with complication, with long-term current use of insulin (HCC) Controlled on current  medication regimen. Hgba1C is decreased to 6.9.  - HgB A1c - Ambulatory referral to [facility] - CBC with Differential  2. Pre-ulcerative corn or callous  - Ambulatory referral to Podiatry  3. History of CVA (cerebrovascular accident)  - Ambulatory referral to Neurology  4. Left-sided weakness  - Ambulatory referral to Neurology  5. Uncontrolled type 2 diabetes mellitus with complication, without long-term current use of insulin ([place])  - Ambulatory referral to Podiatry  6. Neuropathy (HCC) Gabapentin has not added sustained relief. Will start a trial of Lyrica. Will follow up in 1 month.  - pregabalin (LYRICA) 100 MG capsule; Take 1 capsule (100 mg total) by mouth 2 (two) times daily.  Dispense: 60 capsule; Refill: 0  7. Tobacco use Smoking cessation instruction/counseling given:  counseled patient on the dangers of tobacco use, advised patient to stop smoking, and reviewed strategies to maximize success    RTC: 3 months for diabetes and hypertension     Hollis,Lachina M, FNP     The patient was given clear instructions to go to ER or return to medical center if symptoms do not improve, worsen or new problems develop. The patient verbalized understanding. Will notify patient with laboratory results.

## 2016-04-10 MED FILL — LISINOPRIL 20 MG TABLET: 20 | 30 days supply | Qty: 30 | Fill #2

## 2016-04-10 MED FILL — FENOFIBRATE 145 MG TABLET: 145 | 29 days supply | Qty: 29 | Fill #3

## 2016-04-10 MED FILL — CLOPIDOGREL 75 MG TABLET: 75 | 30 days supply | Qty: 30 | Fill #2

## 2016-04-10 MED FILL — AMLODIPINE BESYLATE 10 MG T: 10 | 30 days supply | Qty: 30 | Fill #2

## 2016-04-16 ENCOUNTER — Other Ambulatory Visit: Payer: Self-pay | Admitting: Family Medicine

## 2016-04-16 MED FILL — TRUEPLUS SYR 1ML 31GX5/16: 31G X 5/16" | 25 days supply | Qty: 100 | Fill #0

## 2016-04-16 MED FILL — TRUEPLUS SYR 1ML 31GX5/16": 31G X 5/16" | 25 days supply | Qty: 100 | Fill #0

## 2016-04-24 ENCOUNTER — Encounter: Payer: Self-pay | Admitting: Podiatry

## 2016-04-24 ENCOUNTER — Ambulatory Visit (INDEPENDENT_AMBULATORY_CARE_PROVIDER_SITE_OTHER): Payer: Medicaid Other | Admitting: Podiatry

## 2016-04-24 VITALS — BP 126/73 | HR 79 | Resp 16 | Ht 70.5 in | Wt 198.0 lb

## 2016-04-24 DIAGNOSIS — D492 Neoplasm of unspecified behavior of bone, soft tissue, and skin: Secondary | ICD-10-CM | POA: Diagnosis not present

## 2016-04-24 DIAGNOSIS — M2041 Other hammer toe(s) (acquired), right foot: Secondary | ICD-10-CM | POA: Diagnosis not present

## 2016-04-24 DIAGNOSIS — M2042 Other hammer toe(s) (acquired), left foot: Secondary | ICD-10-CM | POA: Diagnosis not present

## 2016-04-24 DIAGNOSIS — M7750 Other enthesopathy of unspecified foot: Secondary | ICD-10-CM

## 2016-04-24 DIAGNOSIS — B079 Viral wart, unspecified: Secondary | ICD-10-CM

## 2016-04-24 DIAGNOSIS — M779 Enthesopathy, unspecified: Secondary | ICD-10-CM

## 2016-04-24 NOTE — Progress Notes (Signed)
   Subjective:    Patient ID: Frank Schneider, male    DOB: 1976/01/08, 40 y.o.   MRN: [id number]  HPI Chief Complaint  Patient presents with  . Painful lesion    Bilateral; plantar forefoot; x20 yrs  . Skin condition    Bilateral; pt stated, "Has had dry skin for 20 yrs"; pt Diabetic Type 2; Sugar=Did not check today; A1C=6.9      Review of Systems  Constitutional: Positive for appetite change.  Eyes: Positive for visual disturbance.  Musculoskeletal: Positive for gait problem.  Neurological: Positive for dizziness, weakness, light-headedness and numbness.  All other systems reviewed and are negative.      Objective:   Physical Exam        Assessment & Plan:

## 2016-04-24 NOTE — Progress Notes (Signed)
Subjective:     Patient ID: Frank Schneider, male   DOB: 25-Dec-1975, 40 y.o.   MRN: [id number]  HPI patient presents with severe lesions on the bottom of both feet which become painful and states his mother had the same condition and also has significant hammertoe deformity bilateral   Review of Systems  All other systems reviewed and are negative.      Objective:   Physical Exam  Constitutional: He is oriented to person, place, and time.  Cardiovascular: Intact distal pulses.   Musculoskeletal: Normal range of motion.  Neurological: He is oriented to person, place, and time.  Skin: Skin is warm.  Nursing note and vitals reviewed.  neurovascular status found to be intact muscle strength was adequate range of motion was within normal limits. Patient's severe keratotic lesion sub-fourth metatarsal left subsecond metatarsal right with digital contracture bilateral with relatively rigid hammertoe deformity. The lesions do have nucleated cores and are painful when palpated     Assessment:     Possibility for verruca plantaris versus possibility for plantarflexed metatarsal digital deformity with chronic keratotic-type lesion formation    Plan:     H&P and conditions reviewed. At this point I did deep debridable of lesions and I applied chemical agent to try to take pressure off of them and try to soften and reduce the pressure. Ultimately this may require surgical intervention but we'll hold off at the current time

## 2016-04-28 MED FILL — ATROPINE 1% EYE DROPS: 1 | 30 days supply | Qty: 5 | Fill #0

## 2016-04-28 MED FILL — PREDNISOLONE AC 1% EYE DROP: 1 | 30 days supply | Qty: 5 | Fill #0

## 2016-04-28 MED FILL — KETOROLAC 0.5% OPHTH SOLN: 0.5 | 30 days supply | Qty: 10 | Fill #0

## 2016-04-28 MED FILL — OFLOXACIN 0.3% EYE DROPS: 0.3 | 30 days supply | Qty: 10 | Fill #0

## 2016-04-30 ENCOUNTER — Telehealth: Payer: Self-pay

## 2016-04-30 ENCOUNTER — Encounter: Payer: Self-pay | Admitting: General Surgery

## 2016-04-30 NOTE — Telephone Encounter (Signed)
Spoke with patient and told him you would look into this tomorrow. He is unsure he wants to try Lyrica due to side effects. He just can't decide. Please call him tomorrow to let him know if it is even an option with his insurance. Thanks

## 2016-05-01 NOTE — Telephone Encounter (Signed)
Entered in Prior authorization in [employer]. This is in suspended status. I will follow up on Monday 05/04/2016. Thanks!

## 2016-05-04 ENCOUNTER — Other Ambulatory Visit: Payer: Self-pay | Admitting: Family Medicine

## 2016-05-04 DIAGNOSIS — E1149 Type 2 diabetes mellitus with other diabetic neurological complication: Secondary | ICD-10-CM

## 2016-05-04 MED ORDER — VENLAFAXINE HCL 50 MG PO TABS: 50.0000 mg | ORAL_TABLET | Freq: Two times a day (BID) | ORAL | 2 refills | Status: DC

## 2016-05-04 MED ORDER — GABAPENTIN 300 MG PO CAPS: 300.0000 mg | ORAL_CAPSULE | Freq: Four times a day (QID) | ORAL | 3 refills | Status: DC

## 2016-05-04 MED FILL — GABAPENTIN 300 MG CAPSULE: 300 | 30 days supply | Qty: 120 | Fill #0

## 2016-05-04 MED FILL — VENLAFAXINE HCL 50 MG TAB: 50 | 30 days supply | Qty: 60 | Fill #0

## 2016-05-04 NOTE — Progress Notes (Signed)
Patient unable to start Lyrica due to cost constraints. Will continue Gabapentin. Will add Effexor 50 mg BID for diabetic neuropathy.    Meds ordered this encounter  Medications  . venlafaxine (EFFEXOR) 50 MG tablet    Sig: Take 1 tablet (50 mg total) by mouth 2 (two) times daily with a meal.    Dispense:  60 tablet    Refill:  2  . gabapentin (NEURONTIN) 300 MG capsule    Sig: Take 1 capsule (300 mg total) by mouth 4 (four) times daily.    Dispense:  120 capsule    Refill:  3   Ina Poupard M, FNP

## 2016-05-04 NOTE — Progress Notes (Signed)
Called and spoke with patient, advised of medicaid denying Lyrica. Advised we will not try gapapentin 300mg  1 capsule 4 times daily along with Effexor 50 mg twice daily. Advised patient that this has already been sent into pharmacy and to pick this up and take as directed. Advised patient to keep next appointment and to call if any problems. Thanks!

## 2016-05-04 NOTE — Telephone Encounter (Signed)
This was denied for approval per patients insurance. [place] is there anything else you can prescribe for him? Please advise. Thanks!

## 2016-05-07 ENCOUNTER — Ambulatory Visit (INDEPENDENT_AMBULATORY_CARE_PROVIDER_SITE_OTHER): Payer: Medicaid Other | Admitting: Family Medicine

## 2016-05-07 ENCOUNTER — Encounter: Payer: Self-pay | Admitting: Family Medicine

## 2016-05-07 VITALS — BP 151/82 | HR 102 | Temp 98.6°F | Resp 16 | Ht 70.5 in | Wt 204.0 lb

## 2016-05-07 DIAGNOSIS — E1149 Type 2 diabetes mellitus with other diabetic neurological complication: Secondary | ICD-10-CM

## 2016-05-07 DIAGNOSIS — IMO0002 Reserved for concepts with insufficient information to code with codable children: Secondary | ICD-10-CM

## 2016-05-07 DIAGNOSIS — E1165 Type 2 diabetes mellitus with hyperglycemia: Secondary | ICD-10-CM | POA: Diagnosis not present

## 2016-05-07 DIAGNOSIS — E118 Type 2 diabetes mellitus with unspecified complications: Secondary | ICD-10-CM

## 2016-05-07 DIAGNOSIS — Z720 Tobacco use: Secondary | ICD-10-CM

## 2016-05-07 NOTE — Patient Instructions (Addendum)
Diabetes Mellitus and Food It is important for you to manage your blood sugar (glucose) level. Your blood glucose level can be greatly affected by what you eat. Eating healthier foods in the appropriate amounts throughout the day at about the same time each day will help you control your blood glucose level. It can also help slow or prevent worsening of your diabetes mellitus. Healthy eating may even help you improve the level of your blood pressure and reach or maintain a healthy weight. General recommendations for healthful eating and cooking habits include:  Eating meals and snacks regularly. Avoid going long periods of time without eating to lose weight.  Eating a diet that consists mainly of plant-based foods, such as fruits, vegetables, nuts, legumes, and whole grains.  Using low-heat cooking methods, such as baking, instead of high-heat cooking methods, such as deep frying.  Work with your dietitian to make sure you understand how to use the Nutrition Facts information on food labels. How can food affect me? Carbohydrates Carbohydrates affect your blood glucose level more than any other type of food. Your dietitian will help you determine how many carbohydrates to eat at each meal and teach you how to count carbohydrates. Counting carbohydrates is important to keep your blood glucose at a healthy level, especially if you are using insulin or taking certain medicines for diabetes mellitus. Alcohol Alcohol can cause sudden decreases in blood glucose (hypoglycemia), especially if you use insulin or take certain medicines for diabetes mellitus. Hypoglycemia can be a life-threatening condition. Symptoms of hypoglycemia (sleepiness, dizziness, and disorientation) are similar to symptoms of having too much alcohol. If your health care provider has given you approval to drink alcohol, do so in moderation and use the following guidelines:  Women should not have more than one drink per day, and men  should not have more than two drinks per day. One drink is equal to: ? 12 oz of beer. ? 5 oz of wine. ? 1 oz of hard liquor.  Do not drink on an empty stomach.  Keep yourself hydrated. Have water, diet soda, or unsweetened iced tea.  Regular soda, juice, and other mixers might contain a lot of carbohydrates and should be counted.  What foods are not recommended? As you make food choices, it is important to remember that all foods are not the same. Some foods have fewer nutrients per serving than other foods, even though they might have the same number of calories or carbohydrates. It is difficult to get your body what it needs when you eat foods with fewer nutrients. Examples of foods that you should avoid that are high in calories and carbohydrates but low in nutrients include:  Trans fats (most processed foods list trans fats on the Nutrition Facts label).  Regular soda.  Juice.  Candy.  Sweets, such as cake, pie, doughnuts, and cookies.  Fried foods.  What foods can I eat? Eat nutrient-rich foods, which will nourish your body and keep you healthy. The food you should eat also will depend on several factors, including:  The calories you need.  The medicines you take.  Your weight.  Your blood glucose level.  Your blood pressure level.  Your cholesterol level.  You should eat a variety of foods, including:  Protein. ? Lean cuts of meat. ? Proteins low in saturated fats, such as fish, egg whites, and beans. Avoid processed meats.  Fruits and vegetables. ? Fruits and vegetables that may help control blood glucose levels, such as apples,   mangoes, and yams.  Dairy products. ? Choose fat-free or low-fat dairy products, such as milk, yogurt, and cheese.  Grains, bread, pasta, and rice. ? Choose whole grain products, such as multigrain bread, whole oats, and brown rice. These foods may help control blood pressure.  Fats. ? Foods containing healthful fats, such as  nuts, avocado, olive oil, canola oil, and fish.  Does everyone with diabetes mellitus have the same meal plan? Because every person with diabetes mellitus is different, there is not one meal plan that works for everyone. It is very important that you meet with a dietitian who will help you create a meal plan that is just right for you. This information is not intended to replace advice given to you by your health care provider. Make sure you discuss any questions you have with your health care provider. Document Released: 01/08/2005 Document Revised: 09/19/2015 Document Reviewed: 03/10/2013 Elsevier Interactive Patient Education  2017 [employer] Diabetes and Foot Care Diabetes may cause you to have problems because of poor blood supply (circulation) to your feet and legs. This may cause the skin on your feet to become thinner, break easier, and heal more slowly. Your skin may become dry, and the skin may peel and crack. You may also have nerve damage in your legs and feet causing decreased feeling in them. You may not notice minor injuries to your feet that could lead to infections or more serious problems. Taking care of your feet is one of the most important things you can do for yourself. Follow these instructions at home:  Wear shoes at all times, even in the house. Do not go barefoot. Bare feet are easily injured.  Check your feet daily for blisters, cuts, and redness. If you cannot see the bottom of your feet, use a mirror or ask someone for help.  Wash your feet with warm water (do not use hot water) and mild soap. Then pat your feet and the areas between your toes until they are completely dry. Do not soak your feet as this can dry your skin.  Apply a moisturizing lotion or petroleum jelly (that does not contain alcohol and is unscented) to the skin on your feet and to dry, brittle toenails. Do not apply lotion between your toes.  Trim your toenails straight across. Do not dig under  them or around the cuticle. File the edges of your nails with an emery board or nail file.  Do not cut corns or calluses or try to remove them with medicine.  Wear clean socks or stockings every day. Make sure they are not too tight. Do not wear knee-high stockings since they may decrease blood flow to your legs.  Wear shoes that fit properly and have enough cushioning. To break in new shoes, wear them for just a few hours a day. This prevents you from injuring your feet. Always look in your shoes before you put them on to be sure there are no objects inside.  Do not cross your legs. This may decrease the blood flow to your feet.  If you find a minor scrape, cut, or break in the skin on your feet, keep it and the skin around it clean and dry. These areas may be cleansed with mild soap and water. Do not cleanse the area with peroxide, alcohol, or iodine.  When you remove an adhesive bandage, be sure not to damage the skin around it.  If you have a wound, look at it several times   a day to make sure it is healing.  Do not use heating pads or hot water bottles. They may burn your skin. If you have lost feeling in your feet or legs, you may not know it is happening until it is too late.  Make sure your health care provider performs a complete foot exam at least annually or more often if you have foot problems. Report any cuts, sores, or bruises to your health care provider immediately. Contact a health care provider if:  You have an injury that is not healing.  You have cuts or breaks in the skin.  You have an ingrown nail.  You notice redness on your legs or feet.  You feel burning or tingling in your legs or feet.  You have pain or cramps in your legs and feet.  Your legs or feet are numb.  Your feet always feel cold. Get help right away if:  There is increasing redness, swelling, or pain in or around a wound.  There is a red line that goes up your leg.  Pus is coming from a  wound.  You develop a fever or as directed by your health care provider.  You notice a bad smell coming from an ulcer or wound. This information is not intended to replace advice given to you by your health care provider. Make sure you discuss any questions you have with your health care provider. Document Released: 04/10/2000 Document Revised: 09/19/2015 Document Reviewed: 09/20/2012 Elsevier Interactive Patient Education  2017 [employer]  

## 2016-05-07 NOTE — Progress Notes (Signed)
Subjective:    Patient ID: Frank Schneider, male    DOB: 31-Jan-1976, 41 y.o.   MRN: [id number] Frank Schneider, a 41 year old male that presents for a 1 month follow up of diabetic neuropathy. Patient complains of increased paresthesias to upper and lower extremities. Patient's gabapentin was increased and Effexor 50 mg was added to medication regimen. Frank Schneider has not been taking medications consistently and symptoms have not improved. Patient denies headache, blurred vision, recent falls, or weakness.  Diabetes  He presents for his follow-up diabetic visit. He has type 2 diabetes mellitus. His disease course has been stable. Pertinent negatives for hypoglycemia include no confusion, dizziness, nervousness/anxiousness, pallor, seizures, sleepiness, speech difficulty or tremors. Associated symptoms include foot paresthesias and weakness. Pertinent negatives for diabetes include no fatigue, no foot ulcerations, no polydipsia, no polyphagia, no polyuria and no visual change. Pertinent negatives for hypoglycemia complications include no blackouts, no nocturnal hypoglycemia and no required glucagon injection. Symptoms are stable. Risk factors for coronary artery disease include hypertension and male sex. Current diabetic treatment includes insulin injections. He is compliant with treatment all of the time. He is currently taking insulin at bedtime. Insulin injections are given by patient. Rotation sites for injection include the abdominal wall. His weight is stable. He is following a diabetic diet. He has not had a previous visit with a dietitian. He never participates in exercise. His breakfast blood glucose is taken between 9-10 am. His breakfast blood glucose range is generally 130-140 mg/dl. An ACE inhibitor/angiotensin II receptor blocker is being taken. He does not see a podiatrist.Eye exam is current.    Past Medical History:  Diagnosis Date  . Blind right eye   . Diabetes mellitus    Takes  Metformin  . Hypertension   . Priapism   . Stroke ([place])   . Tobacco use   . Vision abnormalities    Allergies as of 05/07/2016   No Known Allergies     Medication List       Accurate as of 05/07/16  2:11 PM. Always use your most recent med list.          amLODipine 10 MG tablet Commonly known as:  NORVASC Take 1 tablet (10 mg total) by mouth daily.   clopidogrel 75 MG tablet Commonly known as:  PLAVIX Take 1 tablet (75 mg total) by mouth daily.   fenofibrate 145 MG tablet Commonly known as:  TRICOR Take 1 tablet (145 mg total) by mouth daily.   ferrous sulfate 325 (65 FE) MG tablet TAKE 1 TABLET BY MOUTH DAILY WITH BREAKFAST   freestyle lancets Use as instructed   gabapentin 300 MG capsule Commonly known as:  NEURONTIN Take 1 capsule (300 mg total) by mouth 4 (four) times daily.   glucose blood test strip Commonly known as:  TRUE METRIX BLOOD GLUCOSE TEST Use as instructed   LANTUS 100 UNIT/ML injection Generic drug:  insulin glargine INJECT 20 UNITS INTO THE SKIN AT BEDTIME.   lisinopril 20 MG tablet Commonly known as:  PRINIVIL,ZESTRIL Take 1 tablet (20 mg total) by mouth daily.   ondansetron 4 MG disintegrating tablet Commonly known as:  ZOFRAN ODT Take 1 tablet (4 mg total) by mouth every 8 (eight) hours as needed for nausea or vomiting.   pregabalin 100 MG capsule Commonly known as:  LYRICA Take 1 capsule (100 mg total) by mouth 2 (two) times daily.   saccharomyces boulardii 250 MG capsule Commonly known as:  FLORASTOR You can  pick this up over the counter at any pharmacy. I would recommend starting now and continuing for at least 1-2 weeks after you finish all of your antibiotics.   TRUE METRIX AIR GLUCOSE METER Devi 1 each by Does not apply route 2 (two) times daily at 10 AM and 5 PM.   TRUEPLUS INSULIN SYRINGE 31G X 5/16" 1 ML Misc Generic drug:  Insulin Syringe-Needle U-100 USE AS DIRECTED.   venlafaxine 50 MG tablet Commonly known as:   EFFEXOR Take 1 tablet (50 mg total) by mouth 2 (two) times daily with a meal.      Immunization History  Administered Date(s) Administered  . Influenza,inj,Quad PF,36+ Mos 01/15/2015  . Pneumococcal Polysaccharide-23 07/29/2011  . Tdap 01/15/2015  No Known Allergies Review of Systems  Constitutional: Negative for fatigue, fever and unexpected weight change.  Eyes: Negative for photophobia.  Respiratory: Negative for chest tightness.   Cardiovascular: Negative.   Gastrointestinal: Negative.  Negative for anorexia, change in bowel habit, constipation, diarrhea and nausea.  Endocrine: Negative for cold intolerance, heat intolerance, polydipsia, polyphagia and polyuria.  Genitourinary: Negative for decreased urine volume, frequency, testicular pain and urgency.  Musculoskeletal: Negative.   Skin: Negative.  Negative for pallor, rash and wound.  Allergic/Immunologic: Negative for immunocompromised state.  Neurological: Positive for weakness and numbness (left side). Negative for dizziness, tremors, seizures, facial asymmetry and speech difficulty.  Hematological: Negative.   Psychiatric/Behavioral: Negative.  Negative for confusion, sleep disturbance and suicidal ideas. The patient is not nervous/anxious.        Objective:   Physical Exam  Constitutional: He is oriented to person, place, and time. He appears well-developed and well-nourished.  HENT:  Head: Normocephalic.  Right Ear: Hearing, tympanic membrane, external ear and ear canal normal.  Left Ear: Hearing, tympanic membrane, external ear and ear canal normal.  Nose: Nose normal.  Mouth/Throat: Uvula is midline, oropharynx is clear and moist and mucous membranes are normal.  Eyes: Conjunctivae, EOM and lids are normal. Right pupil is reactive.  Fundoscopic exam:      The right eye shows no red reflex.       The left eye shows red reflex.    Neck: Trachea normal and normal range of motion. Neck supple. No neck rigidity.  Normal range of motion present.  Cardiovascular: Normal rate, regular rhythm, S1 normal, S2 normal, normal heart sounds, intact distal pulses and normal pulses.   Pulmonary/Chest: Effort normal and breath sounds normal.  Abdominal: Soft. Normal appearance and bowel sounds are normal.  Musculoskeletal: Normal range of motion.  Neurological: He is alert and oriented to person, place, and time. He has normal reflexes. He is not disoriented. No cranial nerve deficit. He exhibits normal muscle tone.  Monofilament examination within normal limits  Skin: Skin is warm, dry and intact.  Psychiatric: He has a normal mood and affect. His speech is normal and behavior is normal. Judgment and thought content normal. Cognition and memory are normal.      BP (!) 151/82 (BP Location: Left Arm, Patient Position: Sitting, Cuff Size: Large)   Pulse (!) 102   Temp 98.6 F (37 C) (Oral)   Resp 16   Ht 5' 10.5" (1.791 m)   Wt 204 lb (92.5 kg)   SpO2 100%   BMI 28.86 kg/m  Assessment & Plan:  1. Type 2 diabetes mellitus with complication, with long-term current use of insulin (HCC) Diabetes is currently at goal. Controlled on current medication regimen. Hgba1C is decreased to 6.9.  Recommend a lowfat, low carbohydrate diet divided over 5-6 small meals, increase water intake to 6-8 glasses, and 150 minutes per week of cardiovascular exercise.    2. Other diabetic neurological complication associated with type 2 diabetes mellitus ([place]) There have been some compliance issues here. I have discussed with him the great importance of following the treatment plan exactly as directed in order to achieve a good medical outcome.  3. Tobacco dependence Smoking cessation instruction/counseling given:  counseled patient on the dangers of tobacco use, advised patient to stop smoking, and reviewed strategies to maximize success     RTC: 3 months for diabetes and hypertension     Babygirl Trager M, FNP     The  patient was given clear instructions to go to ER or return to medical center if symptoms do not improve, worsen or new problems develop. The patient verbalized understanding. Will notify patient with laboratory results.

## 2016-05-18 ENCOUNTER — Other Ambulatory Visit: Payer: Self-pay | Admitting: Family Medicine

## 2016-05-18 DIAGNOSIS — Z8673 Personal history of transient ischemic attack (TIA), and cerebral infarction without residual deficits: Secondary | ICD-10-CM

## 2016-05-18 DIAGNOSIS — E785 Hyperlipidemia, unspecified: Secondary | ICD-10-CM

## 2016-05-18 DIAGNOSIS — I10 Essential (primary) hypertension: Secondary | ICD-10-CM

## 2016-05-18 MED FILL — LISINOPRIL 20 MG TABLET: 20 | 30 days supply | Qty: 30 | Fill #3

## 2016-05-18 MED FILL — $LANTUS 100 UNITS/ML VIAL: 100 | 50 days supply | Qty: 10 | Fill #1

## 2016-05-18 MED FILL — AMLODIPINE BESYLATE 10 MG T: 10 | 30 days supply | Qty: 30 | Fill #0

## 2016-05-18 MED FILL — CLOPIDOGREL 75 MG TABLET: 75 | 30 days supply | Qty: 30 | Fill #0

## 2016-05-18 MED FILL — FENOFIBRATE 145 MG TABLET: 145 | 29 days supply | Qty: 29 | Fill #4

## 2016-05-27 DIAGNOSIS — H25811 Combined forms of age-related cataract, right eye: Secondary | ICD-10-CM | POA: Diagnosis not present

## 2016-05-27 DIAGNOSIS — H219 Unspecified disorder of iris and ciliary body: Secondary | ICD-10-CM | POA: Diagnosis not present

## 2016-05-27 DIAGNOSIS — H2521 Age-related cataract, morgagnian type, right eye: Secondary | ICD-10-CM | POA: Diagnosis not present

## 2016-05-27 DIAGNOSIS — H5703 Miosis: Secondary | ICD-10-CM | POA: Diagnosis not present

## 2016-05-27 HISTORY — PX: CATARACT EXTRACTION W/ INTRAOCULAR LENS IMPLANT: SHX1309

## 2016-06-03 ENCOUNTER — Telehealth: Payer: Self-pay

## 2016-06-08 ENCOUNTER — Encounter: Payer: Self-pay | Admitting: Family Medicine

## 2016-06-08 ENCOUNTER — Ambulatory Visit (INDEPENDENT_AMBULATORY_CARE_PROVIDER_SITE_OTHER): Payer: Medicaid Other | Admitting: Family Medicine

## 2016-06-08 VITALS — BP 131/79 | HR 78 | Temp 98.6°F | Resp 16 | Ht 70.5 in | Wt 209.0 lb

## 2016-06-08 DIAGNOSIS — E1149 Type 2 diabetes mellitus with other diabetic neurological complication: Secondary | ICD-10-CM | POA: Diagnosis not present

## 2016-06-08 DIAGNOSIS — N522 Drug-induced erectile dysfunction: Secondary | ICD-10-CM | POA: Diagnosis not present

## 2016-06-08 LAB — POCT URINALYSIS DIP (DEVICE)
Bilirubin Urine: NEGATIVE
Glucose, UA: NEGATIVE mg/dL
HGB URINE DIPSTICK: NEGATIVE
Ketones, ur: NEGATIVE mg/dL
NITRITE: NEGATIVE
Protein, ur: NEGATIVE mg/dL
Specific Gravity, Urine: >=1.03 (ref 1.005–1.030)
UROBILINOGEN UA: 0.2 mg/dL (ref 0.0–1.0)
pH: 5.5 (ref 5.0–8.0)

## 2016-06-08 MED ORDER — VENLAFAXINE HCL 25 MG PO TABS: 25.0000 mg | ORAL_TABLET | Freq: Two times a day (BID) | ORAL | 2 refills | Status: DC

## 2016-06-08 MED ORDER — SILDENAFIL CITRATE 25 MG PO TABS: 25.0000 mg | ORAL_TABLET | Freq: Every day | ORAL | 0 refills | Status: DC | PRN

## 2016-06-08 MED FILL — VENLAFAXINE HCL 25 MG TAB: 25 | 30 days supply | Qty: 60 | Fill #0

## 2016-06-08 MED FILL — !VIAGRA 25 MG TABLET: 25 | 30 days supply | Qty: 5 | Fill #0

## 2016-06-08 NOTE — Progress Notes (Signed)
Subjective:    Patient ID: Frank Schneider, male    DOB: May 19, 1975, 41 y.o.   MRN: [id number]  Erectile Dysfunction  This is a recurrent (Mr. Caryn Section is a 41 year old male with a history of type 2 diabetes mellitus with neuropathy. He was started on Effexor 1 month ago for neuropathy and started experiencing erectile dysfunction. Symptoms of neuropathy improved on medication.) problem. The nature of his difficulty is maintaining erection, achieving erection and penetration. He reports no anxiety or performance anxiety. He reports his erection duration to be less than 1 minute. Irritative symptoms do not include frequency, nocturia or urgency. Obstructive symptoms do not include dribbling, incomplete emptying, an intermittent stream, a slower stream, straining or a weak stream. Pertinent negatives include no chills, dysuria, genital pain, hematuria, hesitancy or inability to urinate. The symptoms are aggravated by medications. He has had no adverse reactions caused by medications.     Past Medical History:  Diagnosis Date  . Blind right eye   . Diabetes mellitus    Takes Metformin  . Hypertension   . Priapism   . Stroke ([place])   . Tobacco use   . Vision abnormalities    Social History   Social History  . Marital status: Single    Spouse name: N/A  . Number of children: N/A  . Years of education: N/A   Occupational History  . Not on file.   Social History Main Topics  . Smoking status: Current Some Day Smoker    Packs/day: 0.50    Years: 8.00    Types: Cigarettes  . Smokeless tobacco: Never Used  . Alcohol use No  . Drug use: No     Comment: 3x wk  . Sexual activity: Not on file   Other Topics Concern  . Not on file   Social History Narrative  . No narrative on file   Review of Systems  Constitutional: Negative for chills.  HENT: Negative.   Eyes: Negative.   Respiratory: Negative.   Cardiovascular: Negative.   Gastrointestinal: Negative.   Endocrine: Negative.    Genitourinary: Negative.  Negative for dysuria, frequency, hematuria, hesitancy, incomplete emptying, nocturia and urgency.  Musculoskeletal: Negative.   Skin: Negative.   Allergic/Immunologic: Negative.   Neurological: Positive for weakness and numbness.  Hematological: Negative.   Psychiatric/Behavioral: Negative.  The patient is not nervous/anxious.        Objective:   Physical Exam  Constitutional: He is oriented to person, place, and time. He appears well-developed and well-nourished.  HENT:  Head: Normocephalic and atraumatic.  Right Ear: External ear normal.  Left Ear: External ear normal.  Mouth/Throat: Oropharynx is clear and moist.  Eyes: Conjunctivae and EOM are normal. Pupils are equal, round, and reactive to light.  Neck: Normal range of motion. Neck supple.  Cardiovascular: Normal rate, regular rhythm, normal heart sounds and intact distal pulses.   Abdominal: Soft. Bowel sounds are normal.  Neurological: He is alert and oriented to person, place, and time. He has normal reflexes.  Skin: Skin is warm and dry.  Psychiatric: He has a normal mood and affect. His behavior is normal. Judgment and thought content normal.     BP 131/79 (BP Location: Right Arm, Patient Position: Sitting, Cuff Size: Large)   Pulse 78   Temp 98.6 F (37 C) (Oral)   Resp 16   Ht 5' 10.5" (1.791 m)   Wt 209 lb (94.8 kg)   SpO2 100%   BMI 29.56 kg/m  Assessment & Plan:  1. Drug-induced erectile dysfunction - sildenafil (VIAGRA) 25 MG tablet; Take 1 tablet (25 mg total) by mouth daily as needed for erectile dysfunction.  Dispense: 10 tablet; Refill: 0  2. Other diabetic neurological complication associated with type 2 diabetes mellitus (HCC) Will decrease Effexor dosage. Patient to follow up by phone in 4 weeks.  - venlafaxine (EFFEXOR) 25 MG tablet; Take 1 tablet (25 mg total) by mouth 2 (two) times daily with a meal.  Dispense: 60 tablet; Refill: 2   RTC: Follow up as scheduled for  chronic conditions   Dorena Dew, FNP

## 2016-06-08 NOTE — Patient Instructions (Addendum)
OTC Selenium daily Healthy diet Decrease smoking Erectile Dysfunction Erectile dysfunction is the inability to get or sustain a good enough erection to have sexual intercourse. Erectile dysfunction may involve:  Inability to get an erection.  Lack of enough hardness to allow penetration.  Loss of the erection before sex is finished.  Premature ejaculation. CAUSES  Certain drugs, such as:  Pain relievers.  Antihistamines.  Antidepressants.  Blood pressure medicines.  Water pills (diuretics).  Ulcer medicines.  Muscle relaxants.  Illegal drugs.  Excessive drinking.  Psychological causes, such as:  Anxiety.  Depression.  Sadness.  Exhaustion.  Performance fear.  Stress.  Physical causes, such as:  Artery problems. This may include diabetes, smoking, liver disease, or atherosclerosis.  High blood pressure.  Hormonal problems, such as low testosterone.  Obesity.  Nerve problems. This may include back or pelvic injuries, diabetes mellitus, multiple sclerosis, or Parkinson disease. SYMPTOMS  Inability to get an erection.  Lack of enough hardness to allow penetration.  Loss of the erection before sex is finished.  Premature ejaculation.  Normal erections at some times, but with frequent unsatisfactory episodes.  Orgasms that are not satisfactory in sensation or frequency.  Low sexual satisfaction in either partner because of erection problems.  A curved penis occurring with erection. The curve may cause pain or may be too curved to allow for intercourse.  Never having nighttime erections. DIAGNOSIS Your caregiver can often diagnose this condition by:  Performing a physical exam to find other diseases or specific problems with the penis.  Asking you detailed questions about the problem.  Performing blood tests to check for diabetes mellitus or to measure hormone levels.  Performing urine tests to find other underlying health  conditions.  Performing an ultrasound exam to check for scarring.  Performing a test to check blood flow to the penis.  Doing a sleep study at home to measure nighttime erections. TREATMENT   You may be prescribed medicines by mouth.  You may be given medicine injections into the penis.  You may be prescribed a vacuum pump with a ring.  Penile implant surgery may be performed. You may receive:  An inflatable implant.  A semirigid implant.  Blood vessel surgery may be performed. HOME CARE INSTRUCTIONS  If you are prescribed oral medicine, you should take the medicine as prescribed. Do not increase the dosage without first discussing it with your physician.  If you are using self-injections, be careful to avoid any veins that are on the surface of the penis. Apply pressure to the injection site for 5 minutes.  If you are using a vacuum pump, make sure you have read the instructions before using it. Discuss any questions with your physician before taking the pump home. SEEK MEDICAL CARE IF:  You experience pain that is not responsive to the pain medicine you have been prescribed.  You experience nausea or vomiting. SEEK IMMEDIATE MEDICAL CARE IF:   When taking oral or injectable medications, you experience an erection that lasts longer than 4 hours. If your physician is unavailable, go to the nearest emergency room for evaluation. An erection that lasts much longer than 4 hours can result in permanent damage to your penis.  You have pain that is severe.  You develop redness, severe pain, or severe swelling of your penis.  You have redness spreading up into your groin or lower abdomen.  You are unable to pass your urine. This information is not intended to replace advice given to you by  your health care provider. Make sure you discuss any questions you have with your health care provider. Document Released: 04/10/2000 Document Revised: 12/14/2012 Document Reviewed:  09/15/2012 Elsevier Interactive Patient Education  2017 [employer].

## 2016-06-11 ENCOUNTER — Ambulatory Visit: Payer: Medicaid Other | Admitting: Neurology

## 2016-06-19 MED FILL — FENOFIBRATE 145 MG TABLET: 145 | 30 days supply | Qty: 30 | Fill #5

## 2016-06-19 MED FILL — AMLODIPINE BESYLATE 10 MG T: 10 | 30 days supply | Qty: 30 | Fill #1

## 2016-06-19 MED FILL — GABAPENTIN 300 MG CAPSULE: 300 | 30 days supply | Qty: 120 | Fill #1

## 2016-06-19 MED FILL — CLOPIDOGREL 75 MG TABLET: 75 | 30 days supply | Qty: 30 | Fill #1

## 2016-06-19 MED FILL — LISINOPRIL 20 MG TABLET: 20 | 30 days supply | Qty: 30 | Fill #4

## 2016-07-06 ENCOUNTER — Ambulatory Visit: Payer: Medicaid Other | Admitting: Family Medicine

## 2016-07-07 MED FILL — LANTUS 100 UNITS/ML VIAL: 100 | 28 days supply | Qty: 10 | Fill #2

## 2016-07-07 MED FILL — !VIAGRA 25 MG TABLET: 25 | 30 days supply | Qty: 1 | Fill #1

## 2016-08-05 MED FILL — LISINOPRIL 20 MG TABLET: 20 | 30 days supply | Qty: 30 | Fill #5

## 2016-08-05 MED FILL — CLOPIDOGREL 75 MG TABLET: 75 | 30 days supply | Qty: 30 | Fill #2

## 2016-08-05 MED FILL — AMLODIPINE BESYLATE 10 MG T: 10 | 30 days supply | Qty: 30 | Fill #2

## 2016-08-05 MED FILL — FENOFIBRATE 145 MG TABLET: 145 | 3 days supply | Qty: 3 | Fill #6

## 2016-08-10 ENCOUNTER — Ambulatory Visit: Payer: Medicaid Other | Admitting: Neurology

## 2016-09-06 ENCOUNTER — Encounter (HOSPITAL_COMMUNITY): Payer: Self-pay | Admitting: Emergency Medicine

## 2016-09-06 ENCOUNTER — Emergency Department (HOSPITAL_COMMUNITY)
Admission: EM | Admit: 2016-09-06 | Discharge: 2016-09-06 | Disposition: A | Discharge status: Home / Self Care | Payer: Medicaid Other | Attending: Emergency Medicine | Admitting: Emergency Medicine

## 2016-09-06 DIAGNOSIS — H5711 Ocular pain, right eye: Secondary | ICD-10-CM | POA: Diagnosis present

## 2016-09-06 DIAGNOSIS — Z8673 Personal history of transient ischemic attack (TIA), and cerebral infarction without residual deficits: Secondary | ICD-10-CM | POA: Diagnosis not present

## 2016-09-06 DIAGNOSIS — Z79899 Other long term (current) drug therapy: Secondary | ICD-10-CM | POA: Diagnosis not present

## 2016-09-06 DIAGNOSIS — I10 Essential (primary) hypertension: Secondary | ICD-10-CM | POA: Insufficient documentation

## 2016-09-06 DIAGNOSIS — H409 Unspecified glaucoma: Secondary | ICD-10-CM | POA: Diagnosis not present

## 2016-09-06 DIAGNOSIS — F17210 Nicotine dependence, cigarettes, uncomplicated: Secondary | ICD-10-CM | POA: Insufficient documentation

## 2016-09-06 DIAGNOSIS — E119 Type 2 diabetes mellitus without complications: Secondary | ICD-10-CM | POA: Diagnosis not present

## 2016-09-06 MED ORDER — FLUORESCEIN SODIUM 0.6 MG OP STRP
1.0000 | ORAL_STRIP | Freq: Once | OPHTHALMIC | Status: AC
  Administered 2016-09-06: 1 via OPHTHALMIC
  Filled 2016-09-06: qty 1

## 2016-09-06 MED ORDER — ATROPINE SULFATE 1 % OP OINT: 1.0000 "application " | TOPICAL_OINTMENT | Freq: Every day | OPHTHALMIC | 12 refills | Status: DC

## 2016-09-06 MED ORDER — TOBRAMYCIN 0.3 % OP OINT: 1.0000 "application " | TOPICAL_OINTMENT | Freq: Three times a day (TID) | OPHTHALMIC | 0 refills | Status: DC

## 2016-09-06 MED ORDER — TETRACAINE HCL 0.5 % OP SOLN
2.0000 [drp] | Freq: Once | OPHTHALMIC | Status: AC
  Administered 2016-09-06: 2 [drp] via OPHTHALMIC
  Filled 2016-09-06: qty 4

## 2016-09-06 MED ORDER — PROCHLORPERAZINE EDISYLATE 5 MG/ML IJ SOLN
10.0000 mg | Freq: Once | INTRAMUSCULAR | Status: AC
  Administered 2016-09-06: 10 mg via INTRAVENOUS
  Filled 2016-09-06: qty 2

## 2016-09-06 MED ORDER — DIPHENHYDRAMINE HCL 50 MG/ML IJ SOLN
25.0000 mg | Freq: Once | INTRAMUSCULAR | Status: AC
  Administered 2016-09-06: 25 mg via INTRAVENOUS
  Filled 2016-09-06: qty 1

## 2016-09-06 MED ORDER — SODIUM CHLORIDE 0.9 % IV BOLUS (SEPSIS)
1000.0000 mL | Freq: Once | INTRAVENOUS | Status: AC
  Administered 2016-09-06: 1000 mL via INTRAVENOUS

## 2016-09-06 MED ORDER — MORPHINE SULFATE 15 MG PO TABS: 15.0000 mg | ORAL_TABLET | ORAL | 0 refills | Status: DC | PRN

## 2016-09-06 NOTE — ED Triage Notes (Signed)
Pt reports pain behind right eye that has been occurring all day  Pt reports throbbing pain behind eye and to the back of right side of head. Pt reports being blind in right eye.

## 2016-09-06 NOTE — ED Notes (Signed)
ED Provider at bedside. 

## 2016-09-06 NOTE — ED Provider Notes (Signed)
[facility] DEPT Provider Note   CSN: [id number] Arrival date & time: 09/06/16  9458  By signing my name below, I, Oleh Genin, attest that this documentation has been prepared under the direction and in the presence of Deno Etienne, DO. Electronically Signed: Oleh Genin, Scribe. 09/06/16. 3:13 AM.   History   Chief Complaint Chief Complaint  Patient presents with  . Headache    HPI Frank Schneider is a 41 y.o. male with history of complete R eye blindness, glaucoma, NIDDM, HTN, and prior stroke who presents to the ED with headache and eye pain. The patient states that for the last 12 to 18 hours he has experienced constant pain "behind the R eye" that is throbbing as well as R eye pain with photophobia. No focal weaknesses or loss of sensation. He is blind in the Right eye at baseline. He has an appointment with his opthalmologist later this week. No records of encounter; reports prior cataract removal with replacement. He is scheduled for an upcoming retinal attachment.   The history is provided by the patient. No language interpreter was used.    Past Medical History:  Diagnosis Date  . Blind right eye   . Diabetes mellitus    Takes Metformin  . Hypertension   . Priapism   . Stroke ([place])   . Tobacco use   . Vision abnormalities     Patient Active Problem List   Diagnosis Date Noted  . Acute appendicitis 12/16/2015  . Right cataract 10/04/2015  . Tobacco dependence 10/04/2015  . Absolute anemia 08/07/2015  . Neuropathy 08/07/2015  . Cerebrovascular accident (CVA) ([place])   . Leukocytosis   . Stroke (cerebrum) ([place]) 05/09/2015  . Type 2 diabetes mellitus with circulatory disorder ([place]) 03/12/2015  . Hyperlipidemia 02/14/2015  . History of CVA (cerebrovascular accident) 02/14/2015  . CVA (cerebral vascular accident) ([place]) 01/01/2015  . Diabetes mellitus type 2 with complications, uncontrolled ([place]) 01/01/2015  . Stroke ([place])   . Essential hypertension   .  Smoker   . Tobacco use     Past Surgical History:  Procedure Laterality Date  . APPENDECTOMY    . EYE SURGERY     cataract removed 05/27/2016 right eye   . INCISION AND DRAINAGE PERIRECTAL ABSCESS  07/28/2011   Procedure: IRRIGATION AND DEBRIDEMENT PERIRECTAL ABSCESS;  Surgeon: Adin Hector, MD;  Location: [facility];  Service: General;  Laterality: N/A;   of skin muscle and subcutaneous tissue of perimeum  8cmx12cm area   . IR GENERIC HISTORICAL  01/08/2016   IR RADIOLOGIST EVAL & MGMT 01/08/2016 [facility] INTERV RAD  . LAPAROSCOPIC APPENDECTOMY N/A 12/16/2015   Procedure: APPENDECTOMY LAPAROSCOPIC;  Surgeon: Coralie Keens, MD;  Location: [facility];  Service: General;  Laterality: N/A;  . PENECTOMY         Home Medications    Prior to Admission medications   Medication Sig Start Date End Date Taking? Authorizing Provider  amLODipine (NORVASC) 10 MG tablet TAKE 1 TABLET BY MOUTH DAILY. 05/18/16   Micheline Chapman, NP  atropine 1 % ophthalmic ointment Place 1 application into the right eye daily. 09/06/16   Deno Etienne, DO  Blood Glucose Monitoring Suppl (TRUE METRIX AIR GLUCOSE METER) DEVI 1 each by Does not apply route 2 (two) times daily at 10 AM and 5 PM. 01/15/15   Dorena Dew, FNP  clopidogrel (PLAVIX) 75 MG tablet TAKE 1 TABLET BY MOUTH DAILY. 05/18/16   Micheline Chapman, NP  fenofibrate (TRICOR) 145 MG  tablet Take 1 tablet (145 mg total) by mouth daily. 01/06/16   Micheline Chapman, NP  ferrous sulfate 325 (65 FE) MG tablet TAKE 1 TABLET BY MOUTH DAILY WITH BREAKFAST Patient not taking: Reported on 05/07/2016 11/25/15   Micheline Chapman, NP  gabapentin (NEURONTIN) 300 MG capsule Take 1 capsule (300 mg total) by mouth 4 (four) times daily. 05/04/16   Dorena Dew, FNP  glucose blood (TRUE METRIX BLOOD GLUCOSE TEST) test strip Use as instructed 01/15/15   Dorena Dew, FNP  Lancets (FREESTYLE) lancets Use as instructed 01/03/15   Reyne Dumas, MD  LANTUS 100 UNIT/ML  injection INJECT 20 UNITS INTO THE SKIN AT BEDTIME. 02/03/16   Micheline Chapman, NP  lisinopril (PRINIVIL,ZESTRIL) 20 MG tablet Take 1 tablet (20 mg total) by mouth daily. 01/06/16   Micheline Chapman, NP  morphine (MSIR) 15 MG tablet Take 1 tablet (15 mg total) by mouth every 4 (four) hours as needed for severe pain. 09/06/16   Deno Etienne, DO  ondansetron (ZOFRAN ODT) 4 MG disintegrating tablet Take 1 tablet (4 mg total) by mouth every 8 (eight) hours as needed for nausea or vomiting. Patient not taking: Reported on 06/08/2016 12/16/15   Horton, Barbette Hair, MD  pregabalin (LYRICA) 100 MG capsule Take 1 capsule (100 mg total) by mouth 2 (two) times daily. Patient not taking: Reported on 06/08/2016 04/06/16   Dorena Dew, FNP  sildenafil (VIAGRA) 25 MG tablet Take 1 tablet (25 mg total) by mouth daily as needed for erectile dysfunction. 06/08/16   Dorena Dew, FNP  tobramycin (TOBREX) 0.3 % ophthalmic ointment Place 1 application into the right eye 3 (three) times daily. 09/06/16   Deno Etienne, DO  TRUEPLUS INSULIN SYRINGE 31G X 5/16" 1 ML MISC USE AS DIRECTED. 04/16/16   Micheline Chapman, NP  venlafaxine (EFFEXOR) 25 MG tablet Take 1 tablet (25 mg total) by mouth 2 (two) times daily with a meal. 06/08/16   Dorena Dew, FNP    Family History Family History  Problem Relation Age of Onset  . Diabetes Mother   . Hypertension Mother   . Diabetes Maternal Grandmother   . Hypertension Maternal Grandmother   . Depression Maternal Grandmother     Social History Social History  Substance Use Topics  . Smoking status: Current Some Day Smoker    Packs/day: 0.50    Years: 8.00    Types: Cigarettes  . Smokeless tobacco: Never Used  . Alcohol use No     Allergies   Patient has no known allergies.   Review of Systems Review of Systems  Constitutional: Negative for chills and fever.  HENT: Negative for congestion and facial swelling.   Eyes: Positive for pain. Negative for  discharge.  Respiratory: Negative for shortness of breath.   Cardiovascular: Negative for chest pain and palpitations.  Gastrointestinal: Negative for abdominal pain, diarrhea and vomiting.  Musculoskeletal: Negative for arthralgias and myalgias.  Skin: Negative for color change and rash.  Neurological: Positive for headaches. Negative for tremors and syncope.  Psychiatric/Behavioral: Negative for confusion and dysphoric mood.     Physical Exam Updated Vital Signs BP (!) 174/87 (BP Location: Right Arm)   Pulse 71   Temp 98.2 F (36.8 C) (Oral)   Resp 18   Ht 5\' 10"  (1.778 m)   Wt 203 lb (92.1 kg)   SpO2 97%   BMI 29.13 kg/m   Physical Exam  Constitutional: He is oriented to person,  place, and time. He appears well-developed and well-nourished.  HENT:  Head: Normocephalic and atraumatic.  Eyes:  The R eye is injected and hazy. Right pupil is mid-fixed and dilated. Non-responsive to light.  Neck: Normal range of motion. Neck supple. No JVD present.  Cardiovascular: Normal rate and regular rhythm.  Exam reveals no gallop and no friction rub.   No murmur heard. Pulmonary/Chest: No respiratory distress. He has no wheezes.  Abdominal: He exhibits no distension. There is no rebound and no guarding.  Musculoskeletal: Normal range of motion.  Neurological: He is alert and oriented to person, place, and time.  Skin: No rash noted. No pallor.  Psychiatric: He has a normal mood and affect. His behavior is normal.  Nursing note and vitals reviewed.    ED Treatments / Results  Labs (all labs ordered are listed, but only abnormal results are displayed) Labs Reviewed - No data to display  EKG  EKG Interpretation None       Radiology No results found.  Procedures Procedures (including critical care time)  Medications Ordered in ED Medications  prochlorperazine (COMPAZINE) injection 10 mg (10 mg Intravenous Given 09/06/16 0333)  diphenhydrAMINE (BENADRYL) injection 25 mg  (25 mg Intravenous Given 09/06/16 0332)  sodium chloride 0.9 % bolus 1,000 mL (0 mLs Intravenous Stopped 09/06/16 0520)  fluorescein ophthalmic strip 1 strip (1 strip Right Eye Given by Other 09/06/16 0535)  tetracaine (PONTOCAINE) 0.5 % ophthalmic solution 2 drop (2 drops Right Eye Given by Other 09/06/16 0400)     Initial Impression / Assessment and Plan / ED Course  I have reviewed the triage vital signs and the nursing notes.  Pertinent labs & imaging results that were available during my care of the patient were reviewed by me and considered in my medical decision making (see chart for details).     41 yo M With a chief complaint of a headache. This is right sided. He thinks is different from his prior migraines. He states more just eye pain. Has been having eye pain off and on since January when he had his cataract removed on that side. Had complete loss of vision. Since then he has been taking steroids Toradol, travoprost and brimidine. Felt that his eye symptoms get significant only worse this morning. On exam the cornea appears hazy and is injected. Will check a intraocular pressure  IOP of 48.  Checked multiple times at bedside. Patient also has what appears to be corneal ulceration. Will discuss with ophthalmology. Discussed with Dr. Alanda Slim, ophtho, as the patient is completely blind in that eye he recommended doing a regiment of eyedrops. Was made to increase his Brimidine to 3 times a day.  Atropine once a day, prednisone 3 times a day. Recommend following up with his ophthalmologist on Monday.  6:21 AM:  I have discussed the diagnosis/risks/treatment options with the patient and believe the pt to be eligible for discharge home to follow-up with Optho. We also discussed returning to the ED immediately if new or worsening sx occur. We discussed the sx which are most concerning (e.g., sudden worsening pain, fever, inability to tolerate by mouth) that necessitate immediate return. Medications  administered to the patient during their visit and any new prescriptions provided to the patient are listed below.  Medications given during this visit Medications  prochlorperazine (COMPAZINE) injection 10 mg (10 mg Intravenous Given 09/06/16 0333)  diphenhydrAMINE (BENADRYL) injection 25 mg (25 mg Intravenous Given 09/06/16 0332)  sodium chloride 0.9 % bolus 1,000  mL (0 mLs Intravenous Stopped 09/06/16 0520)  fluorescein ophthalmic strip 1 strip (1 strip Right Eye Given by Other 09/06/16 0535)  tetracaine (PONTOCAINE) 0.5 % ophthalmic solution 2 drop (2 drops Right Eye Given by Other 09/06/16 0400)     The patient appears reasonably screen and/or stabilized for discharge and I doubt any other medical condition or other [facility] requiring further screening, evaluation, or treatment in the ED at this time prior to discharge.    Final Clinical Impressions(s) / ED Diagnoses   Final diagnoses:  Pain of right eye  Acute glaucoma    New Prescriptions Discharge Medication List as of 09/06/2016  5:01 AM    START taking these medications   Details  atropine 1 % ophthalmic ointment Place 1 application into the right eye daily., Starting Sun 09/06/2016, Print    tobramycin (TOBREX) 0.3 % ophthalmic ointment Place 1 application into the right eye 3 (three) times daily., Starting Sun 09/06/2016, Print      I personally performed the services described in this documentation, which was scribed in my presence. The recorded information has been reviewed and is accurate.     Deno Etienne, DO 09/06/16 [phone]

## 2016-09-07 ENCOUNTER — Ambulatory Visit (INDEPENDENT_AMBULATORY_CARE_PROVIDER_SITE_OTHER): Payer: Medicaid Other | Admitting: Family Medicine

## 2016-09-07 ENCOUNTER — Encounter: Payer: Self-pay | Admitting: Family Medicine

## 2016-09-07 VITALS — BP 142/82 | HR 98 | Temp 98.4°F | Resp 14 | Ht 70.5 in | Wt 195.0 lb

## 2016-09-07 DIAGNOSIS — I10 Essential (primary) hypertension: Secondary | ICD-10-CM

## 2016-09-07 DIAGNOSIS — E118 Type 2 diabetes mellitus with unspecified complications: Secondary | ICD-10-CM

## 2016-09-07 DIAGNOSIS — IMO0002 Reserved for concepts with insufficient information to code with codable children: Secondary | ICD-10-CM

## 2016-09-07 DIAGNOSIS — R3915 Urgency of urination: Secondary | ICD-10-CM | POA: Diagnosis not present

## 2016-09-07 DIAGNOSIS — R8290 Unspecified abnormal findings in urine: Secondary | ICD-10-CM

## 2016-09-07 DIAGNOSIS — H538 Other visual disturbances: Secondary | ICD-10-CM | POA: Diagnosis not present

## 2016-09-07 DIAGNOSIS — N522 Drug-induced erectile dysfunction: Secondary | ICD-10-CM | POA: Diagnosis not present

## 2016-09-07 DIAGNOSIS — H5711 Ocular pain, right eye: Secondary | ICD-10-CM | POA: Diagnosis not present

## 2016-09-07 DIAGNOSIS — E785 Hyperlipidemia, unspecified: Secondary | ICD-10-CM

## 2016-09-07 DIAGNOSIS — N390 Urinary tract infection, site not specified: Secondary | ICD-10-CM

## 2016-09-07 DIAGNOSIS — E1165 Type 2 diabetes mellitus with hyperglycemia: Secondary | ICD-10-CM | POA: Diagnosis not present

## 2016-09-07 LAB — GLUCOSE, CAPILLARY: GLUCOSE-CAPILLARY: 240 mg/dL — AB (ref 65–99)

## 2016-09-07 LAB — POCT GLYCOSYLATED HEMOGLOBIN (HGB A1C): Hemoglobin A1C: 7.8

## 2016-09-07 MED ORDER — FENOFIBRATE 145 MG PO TABS: 145.0000 mg | ORAL_TABLET | Freq: Every day | ORAL | 1 refills | Status: DC

## 2016-09-07 MED ORDER — SULFAMETHOXAZOLE-TRIMETHOPRIM 800-160 MG PO TABS: 1.0000 | ORAL_TABLET | Freq: Two times a day (BID) | ORAL | 0 refills | Status: DC

## 2016-09-07 MED ORDER — SILDENAFIL CITRATE 25 MG PO TABS: 25.0000 mg | ORAL_TABLET | Freq: Every day | ORAL | 0 refills | Status: DC | PRN

## 2016-09-07 MED FILL — FENOFIBRATE 145 MG TABLET: 145 | 30 days supply | Qty: 30 | Fill #0

## 2016-09-07 MED FILL — $VIAGRA 25 MG TABLET: 25 MG | 30 days supply | Qty: 5 | Fill #0

## 2016-09-07 MED FILL — SULFAMETHOXAZOLE/TMP DS TAB: 800-160 | 10 days supply | Qty: 20 | Fill #0

## 2016-09-07 NOTE — Patient Instructions (Addendum)
Blurred vision and right eye pain:  Acute appointment with Dr. Herbert Deaner on Wednesday, May 16th at 2:10 Apply cool compresses to eyes  Refrain from driving in current condition   Diabetes mellitus:  Will send a referral to diabetes and nutrition  Continue medications as previously prescribed   Urinary Tract infection:   Bactrim 800-160 mg every 12 hours for 10 days  Increase water intake    Sulfamethoxazole; Trimethoprim, SMX-TMP tablets What is this medicine? SULFAMETHOXAZOLE; TRIMETHOPRIM or SMX-TMP (suhl fuh meth OK suh zohl; trye METH oh prim) is a combination of a sulfonamide antibiotic and a second antibiotic, trimethoprim. It is used to treat or prevent certain kinds of bacterial infections. It will not work for colds, flu, or other viral infections. This medicine may be used for other purposes; ask your health care provider or pharmacist if you have questions. COMMON BRAND NAME(S): Bacter-Aid DS, Bactrim, Bactrim DS, Septra, Septra DS What should I tell my health care provider before I take this medicine? They need to know if you have any of these conditions: -anemia -asthma -being treated with anticonvulsants -if you frequently drink alcohol containing drinks -kidney disease -liver disease -low level of folic acid or [id number] dehydrogenase -poor nutrition or malabsorption -porphyria -severe allergies -thyroid disorder -an unusual or allergic reaction to sulfamethoxazole, trimethoprim, sulfa drugs, other medicines, foods, dyes, or preservatives -pregnant or trying to get pregnant -breast-feeding How should I use this medicine? Take this medicine by mouth with a full glass of water. Follow the directions on the prescription label. Take your medicine at regular intervals. Do not take it more often than directed. Do not skip doses or stop your medicine early. Talk to your pediatrician regarding the use of this medicine in children. Special care may be needed.  This medicine has been used in children as young as 58 months of age. Overdosage: If you think you have taken too much of this medicine contact a poison control center or emergency room at once. NOTE: This medicine is only for you. Do not share this medicine with others. What if I miss a dose? If you miss a dose, take it as soon as you can. If it is almost time for your next dose, take only that dose. Do not take double or extra doses. What may interact with this medicine? Do not take this medicine with any of the following medications: -aminobenzoate potassium -dofetilide -metronidazole This medicine may also interact with the following medications: -ACE inhibitors like benazepril, enalapril, lisinopril, and ramipril -birth control pills -cyclosporine -digoxin -diuretics -indomethacin -medicines for diabetes -methenamine -methotrexate -phenytoin -potassium supplements -pyrimethamine -sulfinpyrazone -tricyclic antidepressants -warfarin This list may not describe all possible interactions. Give your health care provider a list of all the medicines, herbs, non-prescription drugs, or dietary supplements you use. Also tell them if you smoke, drink alcohol, or use illegal drugs. Some items may interact with your medicine. What should I watch for while using this medicine? Tell your doctor or health care professional if your symptoms do not improve. Drink several glasses of water a day to reduce the risk of kidney problems. Do not treat diarrhea with over the counter products. Contact your doctor if you have diarrhea that lasts more than 2 days or if it is severe and watery. This medicine can make you more sensitive to the sun. Keep out of the sun. If you cannot avoid being in the sun, wear protective clothing and use a sunscreen. Do not use sun lamps or tanning beds/booths.  What side effects may I notice from receiving this medicine? Side effects that you should report to your doctor or  health care professional as soon as possible: -allergic reactions like skin rash or hives, swelling of the face, lips, or tongue -breathing problems -fever or chills, sore throat -irregular heartbeat, chest pain -joint or muscle pain -pain or difficulty passing urine -red pinpoint spots on skin -redness, blistering, peeling or loosening of the skin, including inside the mouth -unusual bleeding or bruising -unusually weak or tired -yellowing of the eyes or skin Side effects that usually do not require medical attention (report to your doctor or health care professional if they continue or are bothersome): -diarrhea -dizziness -headache -loss of appetite -nausea, vomiting -nervousness This list may not describe all possible side effects. Call your doctor for medical advice about side effects. You may report side effects to FDA at 1-800-FDA-1088. Where should I keep my medicine? Keep out of the reach of children. Store at room temperature between 20 to 25 degrees C (68 to 77 degrees F). Protect from light. Throw away any unused medicine after the expiration date. NOTE: This sheet is a summary. It may not cover all possible information. If you have questions about this medicine, talk to your doctor, pharmacist, or health care provider.  2018 [employer]vier/Gold Standard (2012-11-18 14:38:26)

## 2016-09-07 NOTE — Progress Notes (Signed)
Subjective:    Patient ID: Frank Schneider, male    DOB: July 19, 1975, 41 y.o.   MRN: [id number]   [name], a 41 year old male with ahistoyr of type 2 diabetes mellitus presents for a follow up of DMII and right eye pain.      Diabetes  He presents for his follow-up diabetic visit. He has type 2 diabetes mellitus. His disease course has been stable. Pertinent negatives for hypoglycemia include no confusion, dizziness, nervousness/anxiousness, pallor, seizures, sleepiness, speech difficulty or tremors. Associated symptoms include blurred vision, foot paresthesias and weakness. Pertinent negatives for diabetes include no fatigue, no foot ulcerations, no polydipsia, no polyphagia, no polyuria and no visual change. Pertinent negatives for hypoglycemia complications include no blackouts, no nocturnal hypoglycemia and no required glucagon injection. Symptoms are stable. Risk factors for coronary artery disease include hypertension and male sex. Current diabetic treatment includes insulin injections. He is compliant with treatment all of the time. He is currently taking insulin at bedtime. Insulin injections are given by patient. Rotation sites for injection include the abdominal wall. His weight is stable. He is following a diabetic diet. He has not had a previous visit with a dietitian. He never participates in exercise. His breakfast blood glucose is taken between 9-10 am. His breakfast blood glucose range is generally 130-140 mg/dl. An ACE inhibitor/angiotensin II receptor blocker is being taken. He does not see a podiatrist.Eye exam is current.  Eye Problem   The right (Frank Schneider was evaluated in  the emergency department on 09/06/2016. He complains of blurred vision and photophobida over the past several days. ) eye is affected. This is a new problem. The current episode started in the past 7 days. The problem occurs constantly. The problem has been gradually worsening. The pain is at a severity  of 7/10. The pain is moderate. There is no known exposure to pink eye. He does not wear contacts. Associated symptoms include blurred vision, an eye discharge, eye redness, a foreign body sensation (primarily right eye), photophobia and weakness. Pertinent negatives include no double vision, fever, nausea or vomiting. He has tried eye drops (Patient was prescribed eye drops and has been using consistently as prescribed. ) for the symptoms. The treatment provided no relief.    Past Medical History:  Diagnosis Date  . Blind right eye   . Diabetes mellitus    Takes Metformin  . Hypertension   . Priapism   . Stroke ([place])   . Tobacco use   . Vision abnormalities    Allergies as of 09/07/2016   No Known Allergies     Medication List       Accurate as of 09/07/16  2:43 PM. Always use your most recent med list.          amLODipine 10 MG tablet Commonly known as:  NORVASC TAKE 1 TABLET BY MOUTH DAILY.   atropine 1 % ophthalmic ointment Place 1 application into the right eye daily.   clopidogrel 75 MG tablet Commonly known as:  PLAVIX TAKE 1 TABLET BY MOUTH DAILY.   fenofibrate 145 MG tablet Commonly known as:  TRICOR Take 1 tablet (145 mg total) by mouth daily.   ferrous sulfate 325 (65 FE) MG tablet TAKE 1 TABLET BY MOUTH DAILY WITH BREAKFAST   freestyle lancets Use as instructed   gabapentin 300 MG capsule Commonly known as:  NEURONTIN Take 1 capsule (300 mg total) by mouth 4 (four) times daily.   glucose blood test strip Commonly  known as:  TRUE METRIX BLOOD GLUCOSE TEST Use as instructed   LANTUS 100 UNIT/ML injection Generic drug:  insulin glargine INJECT 20 UNITS INTO THE SKIN AT BEDTIME.   lisinopril 20 MG tablet Commonly known as:  PRINIVIL,ZESTRIL Take 1 tablet (20 mg total) by mouth daily.   morphine 15 MG tablet Commonly known as:  MSIR Take 1 tablet (15 mg total) by mouth every 4 (four) hours as needed for severe pain.   sildenafil 25 MG  tablet Commonly known as:  VIAGRA Take 1 tablet (25 mg total) by mouth daily as needed for erectile dysfunction.   tobramycin 0.3 % ophthalmic ointment Commonly known as:  TOBREX Place 1 application into the right eye 3 (three) times daily.   TRUE METRIX AIR GLUCOSE METER Devi 1 each by Does not apply route 2 (two) times daily at 10 AM and 5 PM.   TRUEPLUS INSULIN SYRINGE 31G X 5/16" 1 ML Misc Generic drug:  Insulin Syringe-Needle U-100 USE AS DIRECTED.   venlafaxine 25 MG tablet Commonly known as:  EFFEXOR Take 1 tablet (25 mg total) by mouth 2 (two) times daily with a meal.      Immunization History  Administered Date(s) Administered  . Influenza,inj,Quad PF,36+ Mos 01/15/2015  . Pneumococcal Polysaccharide-23 07/29/2011  . Tdap 01/15/2015  No Known Allergies Review of Systems  Constitutional: Negative for fatigue, fever and unexpected weight change.  Eyes: Positive for blurred vision, photophobia, pain, discharge, redness and visual disturbance. Negative for double vision.  Respiratory: Negative for chest tightness.   Cardiovascular: Negative.   Gastrointestinal: Negative.  Negative for constipation, diarrhea, nausea and vomiting.  Endocrine: Negative for cold intolerance, heat intolerance, polydipsia, polyphagia and polyuria.  Genitourinary: Negative for decreased urine volume, frequency, testicular pain and urgency.  Musculoskeletal: Negative.   Skin: Negative.  Negative for pallor, rash and wound.  Allergic/Immunologic: Negative for immunocompromised state.  Neurological: Positive for weakness and numbness (left side). Negative for dizziness, tremors, seizures, facial asymmetry and speech difficulty.  Hematological: Negative.   Psychiatric/Behavioral: Negative.  Negative for confusion, sleep disturbance and suicidal ideas. The patient is not nervous/anxious.        Objective:   Physical Exam  Constitutional: He is oriented to person, place, and time. He appears  well-developed and well-nourished.  HENT:  Head: Normocephalic.  Right Ear: Hearing, tympanic membrane, external ear and ear canal normal.  Left Ear: Hearing, tympanic membrane, external ear and ear canal normal.  Nose: Nose normal.  Mouth/Throat: Uvula is midline, oropharynx is clear and moist and mucous membranes are normal.  Eyes: EOM and lids are normal. Pupils are equal, round, and reactive to light. Right eye exhibits exudate. Right conjunctiva is injected. Right pupil is reactive.  Fundoscopic exam:      The right eye shows no red reflex.       The left eye shows red reflex.  Neck: Trachea normal and normal range of motion. Neck supple. No neck rigidity. Normal range of motion present.  Cardiovascular: Normal rate, regular rhythm, S1 normal, S2 normal, normal heart sounds, intact distal pulses and normal pulses.   Pulmonary/Chest: Effort normal and breath sounds normal.  Abdominal: Soft. Normal appearance and bowel sounds are normal.  Musculoskeletal: Normal range of motion.  Neurological: He is alert and oriented to person, place, and time. He has normal reflexes. He is not disoriented. No cranial nerve deficit. He exhibits normal muscle tone.  Monofilament examination within normal limits  Skin: Skin is warm, dry and intact.  Psychiatric: He has a normal mood and affect. His speech is normal and behavior is normal. Judgment and thought content normal. Cognition and memory are normal.      BP (!) 142/82 Comment: manual  Pulse 98   Temp 98.4 F (36.9 C) (Oral)   Resp 14   Ht 5' 10.5" (1.791 m)   Wt 195 lb (88.5 kg)   SpO2 98%   BMI 27.58 kg/m  Assessment & Plan:   1. Uncontrolled type 2 diabetes mellitus with complication, without long-term current use of insulin (HCC) Hemoglobin a1C is 7.8, which is increased. Discussed diet at length. He has difficulty with meal planning. I will send referral to diabetes education - HgB A1c - COMPLETE METABOLIC PANEL WITH GFR - CBC with  Differential - fenofibrate (TRICOR) 145 MG tablet; Take 1 tablet (145 mg total) by mouth daily.  Dispense: 90 tablet; Refill: 1 - Ambulatory referral to Nutrition and Diabetic Education  2. Abnormal urinalysis Positive nitrates - Urine culture  3. Urinary tract infection without hematuria, site unspecified - sulfamethoxazole-trimethoprim (BACTRIM DS,SEPTRA DS) 800-160 MG tablet; Take 1 tablet by mouth 2 (two) times daily.  Dispense: 20 tablet; Refill: 0 - POCT urinalysis dip (device)  4. Eye pain, right Called Dr. Herbert Deaner and scheduled appointment for  09/09/16 at 2 pm  5. Blurred vision Refer to #4  6. Hyperlipidemia, unspecified hyperlipidemia type The patient is asked to make an attempt to improve diet and exercise patterns to aid in medical management of this problem.  - fenofibrate (TRICOR) 145 MG tablet; Take 1 tablet (145 mg total) by mouth daily.  Dispense: 90 tablet; Refill: 1  7. Urinary urgency - POCT urinalysis dip (device)  8. Essential hypertension - POCT urinalysis dip (device)  9. Drug-induced erectile dysfunction - sildenafil (VIAGRA) 25 MG tablet; Take 1 tablet (25 mg total) by mouth daily as needed for erectile dysfunction.  Dispense: 10 tablet; Refill: 0  RTC: 1 month for DMII  Donia Pounds  MSN, FNP-C [facility] [address] [place], [place] [address] [phone]   The patient was given clear instructions to go to ER or return to medical center if symptoms do not improve, worsen or new problems develop. The patient verbalized understanding. Will notify patient with laboratory results.

## 2016-09-08 LAB — POCT URINALYSIS DIP (DEVICE)
GLUCOSE, UA: NEGATIVE mg/dL
Leukocytes, UA: NEGATIVE
NITRITE: POSITIVE — AB
Protein, ur: 100 mg/dL — AB
Specific Gravity, Urine: >=1.03 (ref 1.005–1.030)
Urobilinogen, UA: 1 mg/dL (ref 0.0–1.0)
pH: 6 (ref 5.0–8.0)

## 2016-09-08 LAB — CBC WITH DIFFERENTIAL/PLATELET
Basophils Absolute: 0 cells/uL (ref 0–200)
Basophils Relative: 0 %
EOS PCT: 1 %
Eosinophils Absolute: 134 cells/uL (ref 15–500)
HCT: 46.2 % (ref 38.5–50.0)
Hemoglobin: 14.9 g/dL (ref 13.2–17.1)
Lymphocytes Relative: 21 %
Lymphs Abs: 2814 cells/uL (ref 850–3900)
MCH: 20.2 pg — AB (ref 27.0–33.0)
MCHC: 32.3 g/dL (ref 32.0–36.0)
MCV: 62.8 fL — ABNORMAL LOW (ref 80.0–100.0)
MONOS PCT: 5 %
Monocytes Absolute: 670 cells/uL (ref 200–950)
NEUTROS ABS: 9782 {cells}/uL — AB (ref 1500–7800)
Neutrophils Relative %: 73 %
PLATELETS: 165 10*3/uL (ref 140–400)
RBC: 7.36 MIL/uL — AB (ref 4.20–5.80)
RDW: 17.5 % — AB (ref 11.0–15.0)
WBC: 13.4 10*3/uL — ABNORMAL HIGH (ref 3.8–10.8)

## 2016-09-08 LAB — COMPLETE METABOLIC PANEL WITH GFR
ALT: 11 U/L (ref 9–46)
AST: 13 U/L (ref 10–40)
Albumin: 5 g/dL (ref 3.6–5.1)
Alkaline Phosphatase: 68 U/L (ref 40–115)
BILIRUBIN TOTAL: 0.8 mg/dL (ref 0.2–1.2)
BUN: 11 mg/dL (ref 7–25)
CHLORIDE: 100 mmol/L (ref 98–110)
CO2: 22 mmol/L (ref 20–31)
CREATININE: 1.3 mg/dL (ref 0.60–1.35)
Calcium: 9.9 mg/dL (ref 8.6–10.3)
GFR, EST AFRICAN AMERICAN: 79 mL/min (ref >=60)
GFR, Est Non African American: 68 mL/min (ref >=60)
GLUCOSE: 227 mg/dL — AB (ref 65–99)
Potassium: 4.2 mmol/L (ref 3.5–5.3)
Sodium: 137 mmol/L (ref 135–146)
TOTAL PROTEIN: 7.6 g/dL (ref 6.1–8.1)

## 2016-09-08 LAB — URINE CULTURE: ORGANISM ID, BACTERIA: NO GROWTH

## 2016-09-25 ENCOUNTER — Telehealth: Payer: Self-pay

## 2016-09-25 DIAGNOSIS — N522 Drug-induced erectile dysfunction: Secondary | ICD-10-CM

## 2016-09-25 DIAGNOSIS — I10 Essential (primary) hypertension: Secondary | ICD-10-CM

## 2016-09-25 DIAGNOSIS — Z8673 Personal history of transient ischemic attack (TIA), and cerebral infarction without residual deficits: Secondary | ICD-10-CM

## 2016-09-25 DIAGNOSIS — E1149 Type 2 diabetes mellitus with other diabetic neurological complication: Secondary | ICD-10-CM

## 2016-09-25 DIAGNOSIS — E785 Hyperlipidemia, unspecified: Secondary | ICD-10-CM

## 2016-09-25 MED ORDER — GABAPENTIN 300 MG PO CAPS: 300.0000 mg | ORAL_CAPSULE | Freq: Four times a day (QID) | ORAL | 3 refills | Status: DC

## 2016-09-25 MED ORDER — VENLAFAXINE HCL 25 MG PO TABS: 25.0000 mg | ORAL_TABLET | Freq: Two times a day (BID) | ORAL | 2 refills | Status: DC

## 2016-09-25 MED ORDER — LISINOPRIL 20 MG PO TABS: 20.0000 mg | ORAL_TABLET | Freq: Every day | ORAL | 1 refills | Status: DC

## 2016-09-25 MED ORDER — SILDENAFIL CITRATE 25 MG PO TABS: 25.0000 mg | ORAL_TABLET | Freq: Every day | ORAL | 0 refills | Status: DC | PRN

## 2016-09-25 MED ORDER — CLOPIDOGREL BISULFATE 75 MG PO TABS: 75.0000 mg | ORAL_TABLET | Freq: Every day | ORAL | 2 refills | Status: DC

## 2016-09-25 MED FILL — VENLAFAXINE HCL 25 MG TAB: 25 | 30 days supply | Qty: 60 | Fill #0

## 2016-09-25 MED FILL — LISINOPRIL 20 MG TABLET: 20 | 30 days supply | Qty: 30 | Fill #0

## 2016-09-25 MED FILL — GABAPENTIN 300 MG CAPSULE: 300 | 30 days supply | Qty: 120 | Fill #0

## 2016-09-25 MED FILL — LANTUS 100 UNITS/ML VIAL: 100 | 28 days supply | Qty: 10 | Fill #3

## 2016-09-25 MED FILL — CLOPIDOGREL 75 MG TABLET: 75 | 30 days supply | Qty: 30 | Fill #0

## 2016-09-25 NOTE — Telephone Encounter (Signed)
Refills have been sent into pharmacy. Thanks!  

## 2016-09-28 ENCOUNTER — Ambulatory Visit: Payer: Medicaid Other | Admitting: Family Medicine

## 2016-10-05 ENCOUNTER — Ambulatory Visit: Payer: Medicaid Other | Admitting: Registered"

## 2016-10-16 ENCOUNTER — Ambulatory Visit: Payer: Medicaid Other | Admitting: Family Medicine

## 2016-10-20 ENCOUNTER — Ambulatory Visit (INDEPENDENT_AMBULATORY_CARE_PROVIDER_SITE_OTHER): Payer: Medicaid Other | Admitting: Neurology

## 2016-10-20 ENCOUNTER — Encounter: Payer: Self-pay | Admitting: Neurology

## 2016-10-20 VITALS — BP 120/66 | HR 58 | Ht 70.5 in | Wt 200.0 lb

## 2016-10-20 DIAGNOSIS — Z794 Long term (current) use of insulin: Secondary | ICD-10-CM | POA: Diagnosis not present

## 2016-10-20 DIAGNOSIS — E1165 Type 2 diabetes mellitus with hyperglycemia: Secondary | ICD-10-CM | POA: Diagnosis not present

## 2016-10-20 DIAGNOSIS — I10 Essential (primary) hypertension: Secondary | ICD-10-CM | POA: Diagnosis not present

## 2016-10-20 DIAGNOSIS — I639 Cerebral infarction, unspecified: Secondary | ICD-10-CM

## 2016-10-20 DIAGNOSIS — Z720 Tobacco use: Secondary | ICD-10-CM | POA: Diagnosis not present

## 2016-10-20 DIAGNOSIS — E118 Type 2 diabetes mellitus with unspecified complications: Secondary | ICD-10-CM | POA: Diagnosis not present

## 2016-10-20 DIAGNOSIS — IMO0002 Reserved for concepts with insufficient information to code with codable children: Secondary | ICD-10-CM

## 2016-10-20 NOTE — Patient Instructions (Signed)
1.  Continue Plavix 75mg  daily 2.  Continue trying to get your diabetes under control 3.  Continue blood pressure control 4.  Quit smoking 5.  It is okay to start exercising in the gym 6.  Follow up in one year or as needed. 7.  Follow Mediterranean diet  Mediterranean Diet A Mediterranean diet refers to food and lifestyle choices that are based on the traditions of countries located on the [employer]. This way of eating has been shown to help prevent certain conditions and improve outcomes for people who have chronic diseases, like kidney disease and heart disease. What are tips for following this plan? Lifestyle  Cook and eat meals together with your family, when possible.  Drink enough fluid to keep your urine clear or pale yellow.  Be physically active every day. This includes: ? Aerobic exercise like running or swimming. ? Leisure activities like gardening, walking, or housework.  Get 7-8 hours of sleep each night.  If recommended by your health care provider, drink red wine in moderation. This means 1 glass a day for nonpregnant women and 2 glasses a day for men. A glass of wine equals 5 oz (150 mL). Reading food labels  Check the serving size of packaged foods. For foods such as rice and pasta, the serving size refers to the amount of cooked product, not dry.  Check the total fat in packaged foods. Avoid foods that have saturated fat or trans fats.  Check the ingredients list for added sugars, such as corn syrup. Shopping  At the grocery store, buy most of your food from the areas near the walls of the store. This includes: ? Fresh fruits and vegetables (produce). ? Grains, beans, nuts, and seeds. Some of these may be available in unpackaged forms or large amounts (in bulk). ? Fresh seafood. ? Poultry and eggs. ? Low-fat dairy products.  Buy whole ingredients instead of prepackaged foods.  Buy fresh fruits and vegetables in-season from local farmers  markets.  Buy frozen fruits and vegetables in resealable bags.  If you do not have access to quality fresh seafood, buy precooked frozen shrimp or canned fish, such as tuna, salmon, or sardines.  Buy small amounts of raw or cooked vegetables, salads, or olives from the deli or salad bar at your store.  Stock your pantry so you always have certain foods on hand, such as olive oil, canned tuna, canned tomatoes, rice, pasta, and beans. Cooking  Cook foods with extra-virgin olive oil instead of using butter or other vegetable oils.  Have meat as a side dish, and have vegetables or grains as your main dish. This means having meat in small portions or adding small amounts of meat to foods like pasta or stew.  Use beans or vegetables instead of meat in common dishes like chili or lasagna.  Experiment with different cooking methods. Try roasting or broiling vegetables instead of steaming or sauteing them.  Add frozen vegetables to soups, stews, pasta, or rice.  Add nuts or seeds for added healthy fat at each meal. You can add these to yogurt, salads, or vegetable dishes.  Marinate fish or vegetables using olive oil, lemon juice, garlic, and fresh herbs. Meal planning  Plan to eat 1 vegetarian meal one day each week. Try to work up to 2 vegetarian meals, if possible.  Eat seafood 2 or more times a week.  Have healthy snacks readily available, such as: ? Vegetable sticks with hummus. ? [place] yogurt. ? Fruit and  nut trail mix.  Eat balanced meals throughout the week. This includes: ? Fruit: 2-3 servings a day ? Vegetables: 4-5 servings a day ? Low-fat dairy: 2 servings a day ? Fish, poultry, or lean meat: 1 serving a day ? Beans and legumes: 2 or more servings a week ? Nuts and seeds: 1-2 servings a day ? Whole grains: 6-8 servings a day ? Extra-virgin olive oil: 3-4 servings a day  Limit red meat and sweets to only a few servings a month What are my food choices?  Mediterranean  diet ? Recommended ? Grains: Whole-grain pasta. Brown rice. Bulgar wheat. Polenta. Couscous. Whole-wheat bread. [place]. ? Vegetables: Artichokes. Beets. Broccoli. Cabbage. Carrots. Eggplant. Green beans. Chard. Kale. Spinach. Onions. Leeks. Peas. Squash. Tomatoes. Peppers. Radishes. ? Fruits: Apples. Apricots. Avocado. Berries. Bananas. Cherries. Dates. Figs. Grapes. Lemons. Melon. Oranges. Peaches. Plums. Pomegranate. ? Meats and other protein foods: Beans. Almonds. Sunflower seeds. Pine nuts. Peanuts. [place]. Salmon. Scall[place]. Shrimp. [place]. Tilapia. Clams. Oysters. Eggs. ? Dairy: Low-fat milk. Cheese. Greek yogurt. ? Beverages: Water. Red wine. Herbal tea. ? Fats and oils: Extra virgin olive oil. Avocado oil. Grape seed oil. ? Sweets and desserts: [place] yogurt with honey. Baked apples. Poached pears. Trail mix. ? Seasoning and other foods: Basil. Cilantro. Coriander. Cumin. Mint. Parsley. Sage. Rosemary. Tarragon. Garlic. Oregano. Thyme. Pepper. Balsalmic vinegar. Tahini. Hummus. Tomato sauce. Olives. Mushrooms. ? Limit these ? Grains: Prepackaged pasta or rice dishes. Prepackaged cereal with added sugar. ? Vegetables: Deep fried potatoes (french fries). ? Fruits: Fruit canned in syrup. ? Meats and other protein foods: Beef. Pork. Lamb. Poultry with skin. Hot dogs. [place] Salines. ? Dairy: Ice cream. Sour cream. Whole milk. ? Beverages: Juice. Sugar-sweetened soft drinks. Beer. Liquor and spirits. ? Fats and oils: Butter. Canola oil. Vegetable oil. Beef fat (tallow). Lard. ? Sweets and desserts: Cookies. Cakes. Pies. Candy. ? Seasoning and other foods: Mayonnaise. Premade sauces and marinades. ? The items listed may not be a complete list. Talk with your dietitian about what dietary choices are right for you. Summary  The Mediterranean diet includes both food and lifestyle choices.  Eat a variety of fresh fruits and vegetables, beans, nuts, seeds, and whole grains.  Limit the amount of red  meat and sweets that you eat.  Talk with your health care provider about whether it is safe for you to drink red wine in moderation. This means 1 glass a day for nonpregnant women and 2 glasses a day for men. A glass of wine equals 5 oz (150 mL). This information is not intended to replace advice given to you by your health care provider. Make sure you discuss any questions you have with your health care provider. Document Released: 12/05/2015 Document Revised: 01/07/2016 Document Reviewed: 12/05/2015 Elsevier Interactive Patient Education  [employer].

## 2016-10-20 NOTE — Progress Notes (Signed)
NEUROLOGY CONSULTATION NOTE  Frank Schneider MRN: [id number] DOB: 09/22/1975  Referring provider: Cammie Sickle, FNP Primary care provider: Cammie Sickle, FNP  Reason for consult:  stroke  HISTORY OF PRESENT ILLNESS: Frank Schneider is a 41 year old right-handed male with hypertension, type 2 diabetes mellitus with neuropathy, tobacco use, substance abuse, and baseline right-eye blindness who presents for history of strokes.  History supplemented by hospital records.  He was admitted to [facility] from 12/31/14 to 01/03/15 for left sided numbness and weakness.  He did not receive tPA as he was outside the window of treatment.  MRI of brain revealed an acute right thalamic infarct.  CTA of head and neck revealed no intracranial or extracranial stenosis.  2D echo demonstrated EF 60-65% with no cardiac source of embolism.  LDL was 27 and Hgb A1c was 9.2.  He was started on ASA 325mg  daily.  He had residual left sided numbness.  He was readmitted to [facility] from 05/09/15 to 05/12/15 for acute onset of left sided weakness and discoordination.  He received IV tPA.  MRI of brain revealed an acute 7x13 mm ischemic infarct in the right ventral pons.  MRA of head was negative.  Carotid doppler and 2D echo were not repeated.  LDL was 55 and Hgb A1c was 8.2.  ASA 325mg  was switched to Plavix.    Hgb A1c from 09/07/16 was 7.8  He still has residual left sided numbness and tingling as well as aching pain in the left leg.    PAST MEDICAL HISTORY: Past Medical History:  Diagnosis Date  . Blind right eye   . Diabetes mellitus    Takes Metformin  . Hypertension   . Priapism   . Stroke ([place])   . Tobacco use   . Vision abnormalities     PAST SURGICAL HISTORY: Past Surgical History:  Procedure Laterality Date  . APPENDECTOMY    . EYE SURGERY     cataract removed 05/27/2016 right eye   . INCISION AND DRAINAGE PERIRECTAL ABSCESS  07/28/2011   Procedure: IRRIGATION  AND DEBRIDEMENT PERIRECTAL ABSCESS;  Surgeon: Adin Hector, MD;  Location: [facility];  Service: General;  Laterality: N/A;   of skin muscle and subcutaneous tissue of perimeum  8cmx12cm area   . IR GENERIC HISTORICAL  01/08/2016   IR RADIOLOGIST EVAL & MGMT 01/08/2016 [facility] INTERV RAD  . LAPAROSCOPIC APPENDECTOMY N/A 12/16/2015   Procedure: APPENDECTOMY LAPAROSCOPIC;  Surgeon: Coralie Keens, MD;  Location: [facility];  Service: General;  Laterality: N/A;  . PENECTOMY      MEDICATIONS: Current Outpatient Prescriptions on File Prior to Visit  Medication Sig Dispense Refill  . amLODipine (NORVASC) 10 MG tablet TAKE 1 TABLET BY MOUTH DAILY. 30 tablet 2  . atropine 1 % ophthalmic ointment Place 1 application into the right eye daily. 3.5 g 12  . Blood Glucose Monitoring Suppl (TRUE METRIX AIR GLUCOSE METER) DEVI 1 each by Does not apply route 2 (two) times daily at 10 AM and 5 PM. 1 Device 0  . clopidogrel (PLAVIX) 75 MG tablet Take 1 tablet (75 mg total) by mouth daily. 30 tablet 2  . fenofibrate (TRICOR) 145 MG tablet Take 1 tablet (145 mg total) by mouth daily. 90 tablet 1  . ferrous sulfate 325 (65 FE) MG tablet TAKE 1 TABLET BY MOUTH DAILY WITH BREAKFAST 30 tablet 3  . gabapentin (NEURONTIN) 300 MG capsule Take 1 capsule (300 mg total) by mouth 4 (  four) times daily. 120 capsule 3  . glucose blood (TRUE METRIX BLOOD GLUCOSE TEST) test strip Use as instructed 100 each 12  . Lancets (FREESTYLE) lancets Use as instructed 100 each 12  . LANTUS 100 UNIT/ML injection INJECT 20 UNITS INTO THE SKIN AT BEDTIME. 10 mL 11  . lisinopril (PRINIVIL,ZESTRIL) 20 MG tablet Take 1 tablet (20 mg total) by mouth daily. 90 tablet 1  . morphine (MSIR) 15 MG tablet Take 1 tablet (15 mg total) by mouth every 4 (four) hours as needed for severe pain. 5 tablet 0  . sildenafil (VIAGRA) 25 MG tablet Take 1 tablet (25 mg total) by mouth daily as needed for erectile dysfunction. 10 tablet 0  . tobramycin (TOBREX) 0.3 %  ophthalmic ointment Place 1 application into the right eye 3 (three) times daily. 3.5 g 0  . TRUEPLUS INSULIN SYRINGE 31G X 5/16" 1 ML MISC USE AS DIRECTED. 100 each 0  . venlafaxine (EFFEXOR) 25 MG tablet Take 1 tablet (25 mg total) by mouth 2 (two) times daily with a meal. 60 tablet 2   No current facility-administered medications on file prior to visit.     ALLERGIES: No Known Allergies  FAMILY HISTORY: Family History  Problem Relation Age of Onset  . Diabetes Mother   . Hypertension Mother   . Diabetes Maternal Grandmother   . Hypertension Maternal Grandmother   . Depression Maternal Grandmother     SOCIAL HISTORY: Social History   Social History  . Marital status: Single    Spouse name: N/A  . Number of children: N/A  . Years of education: N/A   Occupational History  . Not on file.   Social History Main Topics  . Smoking status: Current Some Day Smoker    Packs/day: 0.25    Years: 8.00    Types: Cigarettes  . Smokeless tobacco: Never Used  . Alcohol use No  . Drug use: No     Comment: 3x wk  . Sexual activity: Not on file   Other Topics Concern  . Not on file   Social History Narrative  . No narrative on file    REVIEW OF SYSTEMS: Constitutional: No fevers, chills, or sweats, no generalized fatigue, change in appetite Eyes: baseline vision loss in right eye status post cataract surgery and glaucoma Ear, nose and throat: No hearing loss, ear pain, nasal congestion, sore throat Cardiovascular: No chest pain, palpitations Respiratory:  No shortness of breath at rest or with exertion, wheezes GastrointestinaI: No nausea, vomiting, diarrhea, abdominal pain, fecal incontinence Genitourinary:  No dysuria, urinary retention or frequency Musculoskeletal:  No neck pain, back pain Integumentary: No rash, pruritus, skin lesions Neurological: as above Psychiatric: depression Endocrine: No palpitations, fatigue, diaphoresis, mood swings, change in appetite, change  in weight, increased thirst Hematologic/Lymphatic:  No purpura, petechiae. Allergic/Immunologic: no itchy/runny eyes, nasal congestion, recent allergic reactions, rashes  PHYSICAL EXAM: Vitals:   10/20/16 1519  BP: 120/66  Pulse: (!) 58   General: No acute distress.  Patient appears well-groomed.  Head:  Normocephalic/atraumatic Eyes:  fundi examined but not visualized Neck: supple, no paraspinal tenderness, full range of motion Back: No paraspinal tenderness Heart: regular rate and rhythm Lungs: Clear to auscultation bilaterally. Vascular: No carotid bruits. Neurological Exam: Mental status: alert and oriented to person, place, and time, recent and remote memory intact, fund of knowledge intact, attention and concentration intact, speech fluent and not dysarthric, language intact. Cranial nerves: CN I: not tested CN II: right surgical pupil,  left pupil is round and reactive to light, vision loss in right eye CN III, IV, VI:  full range of motion, no nystagmus, no ptosis CN V: decreased left V2-V3 CN VII: upper and lower face symmetric CN VIII: hearing intact CN IX, X: gag intact, uvula midline CN XI: sternocleidomastoid and trapezius muscles intact CN XII: tongue midline Bulk & Tone: normal, no fasciculations. Motor:  5-/5 left hand grip, otherwise 5/5 throughout.  Finger thumb tapping on left is slowed Sensation:  Pinprick sensation reduced in left lower extremity and vibration sensation intact. Deep Tendon Reflexes:  2+ throughout, toes downgoing. Finger to nose testing:  Slowed on left but wiithout dysmetria.  Heel to shin:  Without dysmetria.  Gait:  Mild left limp.  Able to turn and tandem walk. Romberg negative.  IMPRESSION: Cerebrobvascular disease with history of 2 strokes, likely small vessel disease Hypertension Type 2 diabetes mellitus Hyperlipidemia Tobacco use  PLAN: Continue Plavix 75mg  daily for secondary stroke prevention as per PCP On Tricor for  hyperlipidemia as per PCP (LDL already at goal of less than 70) Continue optimizing glycemic control as per PCP (Hgb A1c now at goal of less than 7) Continue blood pressure control as per PCP Smoking cessation He would like to start exercising.  I have no objections for him to join a gym Follow up in one year  Thank you for allowing me to take part in the care of this patient.  Metta Clines, DO  CC:  Cammie Sickle, FNP

## 2016-10-26 MED FILL — GABAPENTIN 300 MG CAPSULE: 300 | 30 days supply | Qty: 120 | Fill #1

## 2016-10-26 MED FILL — LISINOPRIL 20 MG TABLET: 20 | 30 days supply | Qty: 30 | Fill #1

## 2016-10-26 MED FILL — LANTUS 100 UNITS/ML VIAL: 100 | 28 days supply | Qty: 10 | Fill #4

## 2016-10-26 MED FILL — CLOPIDOGREL 75 MG TABLET: 75 | 30 days supply | Qty: 30 | Fill #1

## 2016-11-09 ENCOUNTER — Ambulatory Visit (INDEPENDENT_AMBULATORY_CARE_PROVIDER_SITE_OTHER): Payer: Medicaid Other | Admitting: Family Medicine

## 2016-11-09 ENCOUNTER — Encounter: Payer: Self-pay | Admitting: Family Medicine

## 2016-11-09 VITALS — BP 142/78 | HR 76 | Temp 98.8°F | Wt 196.0 lb

## 2016-11-09 DIAGNOSIS — N522 Drug-induced erectile dysfunction: Secondary | ICD-10-CM

## 2016-11-09 DIAGNOSIS — E1165 Type 2 diabetes mellitus with hyperglycemia: Secondary | ICD-10-CM | POA: Diagnosis not present

## 2016-11-09 DIAGNOSIS — G4700 Insomnia, unspecified: Secondary | ICD-10-CM | POA: Diagnosis not present

## 2016-11-09 DIAGNOSIS — R8290 Unspecified abnormal findings in urine: Secondary | ICD-10-CM

## 2016-11-09 DIAGNOSIS — I10 Essential (primary) hypertension: Secondary | ICD-10-CM

## 2016-11-09 DIAGNOSIS — E785 Hyperlipidemia, unspecified: Secondary | ICD-10-CM | POA: Diagnosis not present

## 2016-11-09 DIAGNOSIS — E118 Type 2 diabetes mellitus with unspecified complications: Secondary | ICD-10-CM | POA: Diagnosis not present

## 2016-11-09 DIAGNOSIS — Z794 Long term (current) use of insulin: Secondary | ICD-10-CM

## 2016-11-09 DIAGNOSIS — IMO0002 Reserved for concepts with insufficient information to code with codable children: Secondary | ICD-10-CM

## 2016-11-09 DIAGNOSIS — H5711 Ocular pain, right eye: Secondary | ICD-10-CM

## 2016-11-09 DIAGNOSIS — F17200 Nicotine dependence, unspecified, uncomplicated: Secondary | ICD-10-CM

## 2016-11-09 LAB — POCT URINALYSIS DIP (DEVICE)
BILIRUBIN URINE: NEGATIVE
GLUCOSE, UA: NEGATIVE mg/dL
NITRITE: NEGATIVE
PH: 7 (ref 5.0–8.0)
Protein, ur: NEGATIVE mg/dL
Specific Gravity, Urine: 1.02 (ref 1.005–1.030)
Urobilinogen, UA: 0.2 mg/dL (ref 0.0–1.0)

## 2016-11-09 LAB — POCT GLYCOSYLATED HEMOGLOBIN (HGB A1C): HEMOGLOBIN A1C: 7.3

## 2016-11-09 LAB — GLUCOSE, CAPILLARY: GLUCOSE-CAPILLARY: 150 mg/dL — AB (ref 65–99)

## 2016-11-09 MED ORDER — SILDENAFIL CITRATE 25 MG PO TABS: 25.0000 mg | ORAL_TABLET | Freq: Every day | ORAL | 1 refills | Status: DC | PRN

## 2016-11-09 MED ORDER — FENOFIBRATE 145 MG PO TABS: 145.0000 mg | ORAL_TABLET | Freq: Every day | ORAL | 1 refills | Status: DC

## 2016-11-09 MED ORDER — GLUCOSE BLOOD VI STRP: ORAL_STRIP | 12 refills | Status: DC

## 2016-11-09 MED FILL — FENOFIBRATE 145 MG TAB: 145 | 30 days supply | Qty: 30 | Fill #0

## 2016-11-09 NOTE — Patient Instructions (Addendum)
Diabetes mellitus type 2:  Your blood sugar is at goal on current medications. I will not change any medications at this time Continue to check blood sugars fasting in am and 2 hours following meals.   Tobacco dependence:  Continue to decrease cigarette smoking as discussed.   Poor sleep hygiene:   Practice good sleep hygiene.  Stick to a sleep schedule, even on weekends. Exercise is great, but not too late in the day Avoid alcoholic drinks before bed Avoid large meals and beverages late before bed Don't take naps after 3 pm. Keep power naps less than 1 hour.  Relax before bed.  Take a hot bath before bed.  Have a good sleeping environment. Get rid of anything in your bedroom that might distract you from sleep.  Adopt good sleeping posture.   Erectile dysfunction:  Will continue Sildenifil 25 mg as needed  Right eye pain:  Scheduled appointment with Dr. Kathlen Mody at [facility]  On 01/11/2017 at 2:00. You have missed 2 appointments, so please make sure that you go to ensure future scheduling.

## 2016-11-09 NOTE — Progress Notes (Signed)
Subjective:    Patient ID: Frank Schneider, male    DOB: 20-Mar-1976, 41 y.o.   MRN: [id number]  Mr. Aloys Hupfer, a 41 year old male with a history of type 2 diabetes, CVA, and hypertension presents for follow of of chronic conditions.   He says that he has been taking medications consistently. He does not follow a lowfat, carbohydrate modified diet consistently. He says that it has been difficult to fall asleep so he is generally up at night and eats throughout the night. He often naps during the day. He has not been checking blood sugars consistently. He says that he has been out of test strips. He maintains that foot paresthesias have improved and denies polyuria, polydipsia, or polyphagia.   Mr. Enriques is also complaining of right eye pain. He has a history of a right cataract. He has been under the care of opthalmology, but has not scheduled a follow up. Right eye is mostly painful on awakening. He is requesting Morphine to treat eye pain. He says that he was prescribed Morphine in the past by the emergency department, which was effective. He reports decreased vision. He denies dizziness, headache, weakness,  double vision, eye itching, or discharge.     Past Medical History:  Diagnosis Date  . Blind right eye   . Diabetes mellitus    Takes Metformin  . Hypertension   . Priapism   . Stroke ([place])   . Tobacco use   . Vision abnormalities    Allergies as of 11/09/2016   No Known Allergies     Medication List       Accurate as of 11/09/16 11:25 AM. Always use your most recent med list.          amLODipine 10 MG tablet Commonly known as:  NORVASC TAKE 1 TABLET BY MOUTH DAILY.   atropine 1 % ophthalmic ointment Place 1 application into the right eye daily.   clopidogrel 75 MG tablet Commonly known as:  PLAVIX Take 1 tablet (75 mg total) by mouth daily.   fenofibrate 145 MG tablet Commonly known as:  TRICOR Take 1 tablet (145 mg total) by mouth daily.   ferrous  sulfate 325 (65 FE) MG tablet TAKE 1 TABLET BY MOUTH DAILY WITH BREAKFAST   freestyle lancets Use as instructed   gabapentin 300 MG capsule Commonly known as:  NEURONTIN Take 1 capsule (300 mg total) by mouth 4 (four) times daily.   glucose blood test strip Commonly known as:  TRUE METRIX BLOOD GLUCOSE TEST Use as instructed   LANTUS 100 UNIT/ML injection Generic drug:  insulin glargine INJECT 20 UNITS INTO THE SKIN AT BEDTIME.   lisinopril 20 MG tablet Commonly known as:  PRINIVIL,ZESTRIL Take 1 tablet (20 mg total) by mouth daily.   morphine 15 MG tablet Commonly known as:  MSIR Take 1 tablet (15 mg total) by mouth every 4 (four) hours as needed for severe pain.   sildenafil 25 MG tablet Commonly known as:  VIAGRA Take 1 tablet (25 mg total) by mouth daily as needed for erectile dysfunction.   tobramycin 0.3 % ophthalmic ointment Commonly known as:  TOBREX Place 1 application into the right eye 3 (three) times daily.   TRUE METRIX AIR GLUCOSE METER Devi 1 each by Does not apply route 2 (two) times daily at 10 AM and 5 PM.   TRUEPLUS INSULIN SYRINGE 31G X 5/16" 1 ML Misc Generic drug:  Insulin Syringe-Needle U-100 USE AS DIRECTED.  venlafaxine 25 MG tablet Commonly known as:  EFFEXOR Take 1 tablet (25 mg total) by mouth 2 (two) times daily with a meal.      Immunization History  Administered Date(s) Administered  . Influenza,inj,Quad PF,36+ Mos 01/15/2015  . Pneumococcal Polysaccharide-23 07/29/2011  . Tdap 01/15/2015  No Known Allergies Review of Systems  Constitutional: Negative for fatigue, fever and unexpected weight change.  Eyes: Negative for photophobia.  Respiratory: Negative for chest tightness.   Cardiovascular: Negative.   Gastrointestinal: Negative.  Negative for constipation, diarrhea and nausea.  Endocrine: Negative for cold intolerance, heat intolerance, polydipsia, polyphagia and polyuria.  Genitourinary: Negative for decreased urine  volume, frequency, testicular pain and urgency.  Musculoskeletal: Negative.   Skin: Negative.  Negative for pallor, rash and wound.  Allergic/Immunologic: Negative for immunocompromised state.  Neurological: Positive for weakness and numbness (left side). Negative for dizziness, tremors, seizures, facial asymmetry and speech difficulty.  Hematological: Negative.   Psychiatric/Behavioral: Negative for confusion, sleep disturbance and suicidal ideas. The patient has insomnia. The patient is not nervous/anxious.        Objective:   Physical Exam  Constitutional: He is oriented to person, place, and time. He appears well-developed and well-nourished.  HENT:  Head: Normocephalic.  Right Ear: Hearing, tympanic membrane, external ear and ear canal normal.  Left Ear: Hearing, tympanic membrane, external ear and ear canal normal.  Nose: Nose normal.  Mouth/Throat: Uvula is midline, oropharynx is clear and moist and mucous membranes are normal.  Eyes: Conjunctivae, EOM and lids are normal. Right pupil is reactive.  Fundoscopic exam:      The right eye shows no red reflex.       The left eye shows red reflex.    Neck: Trachea normal and normal range of motion. Neck supple. No neck rigidity. Normal range of motion present.  Cardiovascular: Normal rate, regular rhythm, S1 normal, S2 normal, normal heart sounds, intact distal pulses and normal pulses.   Pulmonary/Chest: Effort normal and breath sounds normal.  Abdominal: Soft. Normal appearance and bowel sounds are normal.  Musculoskeletal: Normal range of motion.  Neurological: He is alert and oriented to person, place, and time. He has normal reflexes. He is not disoriented. No cranial nerve deficit. He exhibits normal muscle tone.  Monofilament examination within normal limits  Skin: Skin is warm, dry and intact.  Psychiatric: He has a normal mood and affect. His speech is normal and behavior is normal. Judgment and thought content normal.  Cognition and memory are normal.      BP (!) 142/78 (BP Location: Right Arm, Patient Position: Sitting, Cuff Size: Large)   Pulse 76   Temp 98.8 F (37.1 C) (Oral)   Wt 196 lb (88.9 kg)   SpO2 100%   BMI 27.73 kg/m  Assessment & Plan:   1. Uncontrolled type 2 diabetes mellitus with complication, with long-term current use of insulin (HCC) Hemoglobin a1C is 7. 3. Will continue Lantus 20 units at bedtime. Recommend that he checks blood sugars and bring glucometer to follow up appointment.  - Glucose (CBG) - HgB A1c - Glucose, capillary ll: 1 - glucose blood (TRUE METRIX BLOOD GLUCOSE TEST) test strip; Use as instructed  Dispense: 100 each; Refill: 12 - COMPLETE METABOLIC PANEL WITH GFR - POCT urinalysis dip (device)  2. Essential hypertension Blood pressure is above goal on current regimen. He did not take anti-hypertensive medication prior to arrival. Recommend that he takes medication consistently as prescribed. Reviewed urinalysis, no proteinuria. Will also review  renal functioning as results become available.  - COMPLETE METABOLIC PANEL WITH GFR - POCT urinalysis dip (device)  3. Hyperlipidemia, unspecified hyperlipidemia type The patient is asked to make an attempt to improve diet and exercise patterns to aid in medical management of this problem.  - fenofibrate (TRICOR) 145 MG tablet; Take 1 tablet (145 mg total) by mouth daily.  Dispense: 90 tablet; Refill: 1  4. Drug-induced erectile dysfunction - sildenafil (VIAGRA) 25 MG tablet; Take 1 tablet (25 mg total) by mouth daily as needed for erectile dysfunction.  Dispense: 10 tablet; Refi  5. Abnormal urinalysis Reviewed urinalysis, trace hemoglobin and small leukocytes present. Patient is asymptomatic. Will send a urine culture to rule out bacturia.  - Urine Culture  6. Eye pain, right Patient continues to complain of right eye pain. He has not followed up with opthalmology as scheduled. Called [facility]. Dr.  Kathlen Mody will evaluated right eye pain on 11/10/2016 at 2:00 pm. Discussed with Mr. Hyland that I will not prescribe Morphine for right eye pain. I will defer to opthalmology for further evaluation and treatment. I recommend that he continue Atropine 1% opthalmic ointment as prescribed  7. Insomnia Patient is complaining of insomnia and requesting medication. He reports that he often naps during the day. I recommend that he refrain from daytime napping and practice good sleep hygiene. He was given the following tips:  Practice good sleep hygiene.  Stick to a sleep schedule, even on weekends. Exercise is great, but not too late in the day Avoid alcoholic drinks before bed Avoid large meals and beverages late before bed Don't take naps after 3 pm. Keep power naps less than 1 hour.  Relax before bed.  Take a hot bath before bed.  Have a good sleeping environment. Get rid of anything in your bedroom that might distract you from sleep.  Adopt good sleeping posture.  8. Tobacco dependence Smoking cessation instruction/counseling given:  counseled patient on the dangers of tobacco use, advised patient to stop smoking, and reviewed strategies to maximize success  RTC: 3 months for diabetes and hypertension   Donia Pounds  MSN, FNP-C [address] [place], [place] [address] [phone]  The patient was given clear instructions to go to ER or return to medical center if symptoms do not improve, worsen or new problems develop. The patient verbalized understanding. Will notify patient with laboratory results.

## 2016-11-10 LAB — COMPLETE METABOLIC PANEL WITH GFR

## 2016-11-10 LAB — URINE CULTURE: Organism ID, Bacteria: NO GROWTH

## 2016-11-10 MED FILL — DUREZOL 0.05% EYE DROPS: 0.05 | 15 days supply | Qty: 5 | Fill #0

## 2016-11-10 MED FILL — SIMBRINZA 1%-0.2% EYE DROPS: 1-0.2 | 20 days supply | Qty: 8 | Fill #0

## 2016-11-18 ENCOUNTER — Other Ambulatory Visit: Payer: Self-pay

## 2016-11-18 DIAGNOSIS — E785 Hyperlipidemia, unspecified: Secondary | ICD-10-CM

## 2016-11-18 DIAGNOSIS — D509 Iron deficiency anemia, unspecified: Secondary | ICD-10-CM

## 2016-11-18 DIAGNOSIS — I10 Essential (primary) hypertension: Secondary | ICD-10-CM

## 2016-11-18 MED ORDER — AMLODIPINE BESYLATE 10 MG PO TABS: 10.0000 mg | ORAL_TABLET | Freq: Every day | ORAL | 2 refills | Status: DC

## 2016-11-18 MED ORDER — FERROUS SULFATE 325 (65 FE) MG PO TABS: 325.0000 mg | ORAL_TABLET | Freq: Every day | ORAL | 3 refills | Status: DC

## 2016-12-23 MED FILL — LISINOPRIL 20 MG TAB: 20 | 30 days supply | Qty: 30 | Fill #2

## 2016-12-23 MED FILL — FENOFIBRATE 145 MG TAB: 145 | 30 days supply | Qty: 30 | Fill #1

## 2016-12-23 MED FILL — AMLODIPINE BESYLATE 10 MG T: 10 | 30 days supply | Qty: 30 | Fill #0

## 2016-12-23 MED FILL — FERROUS SULFATE 325 MG TAB: 325 (65 FE) | 30 days supply | Qty: 30 | Fill #0

## 2016-12-23 MED FILL — CLOPIDOGREL 75 MG TABLET: 75 | 30 days supply | Qty: 30 | Fill #2

## 2017-02-10 ENCOUNTER — Ambulatory Visit: Payer: Medicaid Other | Admitting: Family Medicine

## 2017-03-05 ENCOUNTER — Ambulatory Visit: Payer: Medicaid Other | Admitting: Family Medicine

## 2017-03-08 ENCOUNTER — Ambulatory Visit: Payer: Medicaid Other | Admitting: Family Medicine

## 2017-03-12 ENCOUNTER — Encounter: Payer: Self-pay | Admitting: Family Medicine

## 2017-03-12 ENCOUNTER — Ambulatory Visit (INDEPENDENT_AMBULATORY_CARE_PROVIDER_SITE_OTHER): Payer: Medicaid Other | Admitting: Family Medicine

## 2017-03-12 VITALS — BP 142/78 | HR 72 | Temp 98.3°F | Resp 16 | Ht 70.5 in | Wt 205.0 lb

## 2017-03-12 DIAGNOSIS — F17200 Nicotine dependence, unspecified, uncomplicated: Secondary | ICD-10-CM

## 2017-03-12 DIAGNOSIS — I10 Essential (primary) hypertension: Secondary | ICD-10-CM

## 2017-03-12 DIAGNOSIS — Z794 Long term (current) use of insulin: Secondary | ICD-10-CM | POA: Diagnosis not present

## 2017-03-12 DIAGNOSIS — M545 Low back pain, unspecified: Secondary | ICD-10-CM

## 2017-03-12 DIAGNOSIS — E1159 Type 2 diabetes mellitus with other circulatory complications: Secondary | ICD-10-CM | POA: Diagnosis not present

## 2017-03-12 DIAGNOSIS — Z8673 Personal history of transient ischemic attack (TIA), and cerebral infarction without residual deficits: Secondary | ICD-10-CM

## 2017-03-12 DIAGNOSIS — D509 Iron deficiency anemia, unspecified: Secondary | ICD-10-CM

## 2017-03-12 DIAGNOSIS — E1149 Type 2 diabetes mellitus with other diabetic neurological complication: Secondary | ICD-10-CM | POA: Diagnosis not present

## 2017-03-12 DIAGNOSIS — E785 Hyperlipidemia, unspecified: Secondary | ICD-10-CM

## 2017-03-12 LAB — COMPLETE METABOLIC PANEL WITH GFR
AG RATIO: 2.1 (calc) (ref 1.0–2.5)
ALKALINE PHOSPHATASE (APISO): 73 U/L (ref 40–115)
ALT: 14 U/L (ref 9–46)
AST: 15 U/L (ref 10–40)
Albumin: 4.4 g/dL (ref 3.6–5.1)
BILIRUBIN TOTAL: 0.5 mg/dL (ref 0.2–1.2)
BUN: 13 mg/dL (ref 7–25)
CHLORIDE: 103 mmol/L (ref 98–110)
CO2: 29 mmol/L (ref 20–32)
Calcium: 9.5 mg/dL (ref 8.6–10.3)
Creat: 1.02 mg/dL (ref 0.60–1.35)
GFR, Est African American: 105 mL/min/{1.73_m2} (ref >=60)
GFR, Est Non African American: 91 mL/min/{1.73_m2} (ref >=60)
GLOBULIN: 2.1 g/dL (ref 1.9–3.7)
Glucose, Bld: 273 mg/dL — ABNORMAL HIGH (ref 65–99)
POTASSIUM: 4.8 mmol/L (ref 3.5–5.3)
SODIUM: 138 mmol/L (ref 135–146)
Total Protein: 6.5 g/dL (ref 6.1–8.1)

## 2017-03-12 LAB — POCT GLYCOSYLATED HEMOGLOBIN (HGB A1C): HEMOGLOBIN A1C: 8.5

## 2017-03-12 LAB — CBC WITH DIFFERENTIAL/PLATELET
BASOS PCT: 0.4 %
Basophils Absolute: 45 cells/uL (ref 0–200)
Eosinophils Absolute: 202 cells/uL (ref 15–500)
Eosinophils Relative: 1.8 %
HEMATOCRIT: 41.5 % (ref 38.5–50.0)
HEMOGLOBIN: 12.4 g/dL — AB (ref 13.2–17.1)
Lymphs Abs: 2218 cells/uL (ref 850–3900)
MCH: 19.3 pg — AB (ref 27.0–33.0)
MCHC: 29.9 g/dL — AB (ref 32.0–36.0)
MCV: 64.5 fL — ABNORMAL LOW (ref 80.0–100.0)
MONOS PCT: 6.9 %
NEUTROS PCT: 71.1 %
Neutro Abs: 7963 cells/uL — ABNORMAL HIGH (ref 1500–7800)
Platelets: 183 10*3/uL (ref 140–400)
RBC: 6.43 10*6/uL — ABNORMAL HIGH (ref 4.20–5.80)
RDW: 18.7 % — ABNORMAL HIGH (ref 11.0–15.0)
TOTAL LYMPHOCYTE: 19.8 %
WBC mixed population: 773 cells/uL (ref 200–950)
WBC: 11.2 10*3/uL — AB (ref 3.8–10.8)

## 2017-03-12 LAB — POCT URINALYSIS DIP (DEVICE)
Bilirubin Urine: NEGATIVE
Glucose, UA: 500 mg/dL — AB
HGB URINE DIPSTICK: NEGATIVE
KETONES UR: NEGATIVE mg/dL
Nitrite: NEGATIVE
Protein, ur: NEGATIVE mg/dL
SPECIFIC GRAVITY, URINE: 1.02 (ref 1.005–1.030)
UROBILINOGEN UA: 1 mg/dL (ref 0.0–1.0)
pH: 7 (ref 5.0–8.0)

## 2017-03-12 LAB — GLUCOSE, CAPILLARY: Glucose-Capillary: 285 mg/dL — ABNORMAL HIGH (ref 65–99)

## 2017-03-12 MED ORDER — VENLAFAXINE HCL 25 MG PO TABS: 25.0000 mg | ORAL_TABLET | Freq: Two times a day (BID) | ORAL | 5 refills | Status: DC

## 2017-03-12 MED ORDER — KETOROLAC TROMETHAMINE 60 MG/2ML IM SOLN
60.0000 mg | Freq: Once | INTRAMUSCULAR | Status: AC
  Administered 2017-03-12: 60 mg via INTRAMUSCULAR

## 2017-03-12 MED ORDER — CLOPIDOGREL BISULFATE 75 MG PO TABS: 75.0000 mg | ORAL_TABLET | Freq: Every day | ORAL | 5 refills | Status: DC

## 2017-03-12 MED ORDER — GABAPENTIN 300 MG PO CAPS: 300.0000 mg | ORAL_CAPSULE | Freq: Four times a day (QID) | ORAL | 3 refills | Status: DC

## 2017-03-12 MED ORDER — AMLODIPINE BESYLATE 10 MG PO TABS: 10.0000 mg | ORAL_TABLET | Freq: Every day | ORAL | 5 refills | Status: DC

## 2017-03-12 MED ORDER — FENOFIBRATE 145 MG PO TABS: 145.0000 mg | ORAL_TABLET | Freq: Every day | ORAL | 1 refills | Status: DC

## 2017-03-12 MED ORDER — "INSULIN SYRINGE-NEEDLE U-100 31G X 5/16"" 1 ML MISC": 99 refills | Status: DC

## 2017-03-12 MED ORDER — FERROUS SULFATE 325 (65 FE) MG PO TABS: 325.0000 mg | ORAL_TABLET | Freq: Every day | ORAL | 3 refills | Status: DC

## 2017-03-12 NOTE — Patient Instructions (Addendum)
Your hemoglobin A1c is 8.5, which is above goal.  I recommend that you take medications consistently and follow a carbohydrate modified diet divided over 5-6 small meals throughout the day.  Your A1C goal is less than 7. Your fasting blood sugar  Upon awakening goal is between 110-140.  Your LDL  (bad cholesterol goal is less than 100 Blood pressure goal is <140/90.  Recommend a lowfat, low carbohydrate diet divided over 5-6 small meals, increase water intake to 6-8 glasses, and 150 minutes per week of cardiovascular exercise.   Take your medications as prescribed Make sure that you are familiar with each one of your medications and what they are used to treat.  If you are unsure of medications, please bring to follow up Will send referral for eye exam  Please keep your scheduled follow up appointment.   Your blood pressure is also at goal on current medication regimen, no changes warranted.  You received a Toradol 60 mg IM injection for acute low back pain without complication.  I recommend that you call ophthalmology and schedule a follow-up.   All medications have been sent to pharmacy electronically.  Diabetes Mellitus and Food It is important for you to manage your blood sugar (glucose) level. Your blood glucose level can be greatly affected by what you eat. Eating healthier foods in the appropriate amounts throughout the day at about the same time each day will help you control your blood glucose level. It can also help slow or prevent worsening of your diabetes mellitus. Healthy eating may even help you improve the level of your blood pressure and reach or maintain a healthy weight. General recommendations for healthful eating and cooking habits include:  Eating meals and snacks regularly. Avoid going long periods of time without eating to lose weight.  Eating a diet that consists mainly of plant-based foods, such as fruits, vegetables, nuts, legumes, and whole grains.  Using  low-heat cooking methods, such as baking, instead of high-heat cooking methods, such as deep frying.  Work with your dietitian to make sure you understand how to use the Nutrition Facts information on food labels. How can food affect me? Carbohydrates Carbohydrates affect your blood glucose level more than any other type of food. Your dietitian will help you determine how many carbohydrates to eat at each meal and teach you how to count carbohydrates. Counting carbohydrates is important to keep your blood glucose at a healthy level, especially if you are using insulin or taking certain medicines for diabetes mellitus. Alcohol Alcohol can cause sudden decreases in blood glucose (hypoglycemia), especially if you use insulin or take certain medicines for diabetes mellitus. Hypoglycemia can be a life-threatening condition. Symptoms of hypoglycemia (sleepiness, dizziness, and disorientation) are similar to symptoms of having too much alcohol. If your health care provider has given you approval to drink alcohol, do so in moderation and use the following guidelines:  Women should not have more than one drink per day, and men should not have more than two drinks per day. One drink is equal to: ? 12 oz of beer. ? 5 oz of wine. ? 1 oz of hard liquor.  Do not drink on an empty stomach.  Keep yourself hydrated. Have water, diet soda, or unsweetened iced tea.  Regular soda, juice, and other mixers might contain a lot of carbohydrates and should be counted.  What foods are not recommended? As you make food choices, it is important to remember that all foods are not the same.  Some foods have fewer nutrients per serving than other foods, even though they might have the same number of calories or carbohydrates. It is difficult to get your body what it needs when you eat foods with fewer nutrients. Examples of foods that you should avoid that are high in calories and carbohydrates but low in nutrients  include:  Trans fats (most processed foods list trans fats on the Nutrition Facts label).  Regular soda.  Juice.  Candy.  Sweets, such as cake, pie, doughnuts, and cookies.  Fried foods.  What foods can I eat? Eat nutrient-rich foods, which will nourish your body and keep you healthy. The food you should eat also will depend on several factors, including:  The calories you need.  The medicines you take.  Your weight.  Your blood glucose level.  Your blood pressure level.  Your cholesterol level.  You should eat a variety of foods, including:  Protein. ? Lean cuts of meat. ? Proteins low in saturated fats, such as fish, egg whites, and beans. Avoid processed meats.  Fruits and vegetables. ? Fruits and vegetables that may help control blood glucose levels, such as apples, mangoes, and yams.  Dairy products. ? Choose fat-free or low-fat dairy products, such as milk, yogurt, and cheese.  Grains, bread, pasta, and rice. ? Choose whole grain products, such as multigrain bread, whole oats, and brown rice. These foods may help control blood pressure.  Fats. ? Foods containing healthful fats, such as nuts, avocado, olive oil, canola oil, and fish.  Does everyone with diabetes mellitus have the same meal plan? Because every person with diabetes mellitus is different, there is not one meal plan that works for everyone. It is very important that you meet with a dietitian who will help you create a meal plan that is just right for you. This information is not intended to replace advice given to you by your health care provider. Make sure you discuss any questions you have with your health care provider. Document Released: 01/08/2005 Document Revised: 09/19/2015 Document Reviewed: 03/10/2013 Elsevier Interactive Patient Education  2017 [place].  Diabetes and Foot Care Diabetes may cause you to have problems because of poor blood supply (circulation) to your feet and  legs. This may cause the skin on your feet to become thinner, break easier, and heal more slowly. Your skin may become dry, and the skin may peel and crack. You may also have nerve damage in your legs and feet causing decreased feeling in them. You may not notice minor injuries to your feet that could lead to infections or more serious problems. Taking care of your feet is one of the most important things you can do for yourself. Follow these instructions at home:  Wear shoes at all times, even in the house. Do not go barefoot. Bare feet are easily injured.  Check your feet daily for blisters, cuts, and redness. If you cannot see the bottom of your feet, use a mirror or ask someone for help.  Wash your feet with warm water (do not use hot water) and mild soap. Then pat your feet and the areas between your toes until they are completely dry. Do not soak your feet as this can dry your skin.  Apply a moisturizing lotion or petroleum jelly (that does not contain alcohol and is unscented) to the skin on your feet and to dry, brittle toenails. Do not apply lotion between your toes.  Trim your toenails straight across. Do not dig  under them or around the cuticle. File the edges of your nails with an emery board or nail file.  Do not cut corns or calluses or try to remove them with medicine.  Wear clean socks or stockings every day. Make sure they are not too tight. Do not wear knee-high stockings since they may decrease blood flow to your legs.  Wear shoes that fit properly and have enough cushioning. To break in new shoes, wear them for just a few hours a day. This prevents you from injuring your feet. Always look in your shoes before you put them on to be sure there are no objects inside.  Do not cross your legs. This may decrease the blood flow to your feet.  If you find a minor scrape, cut, or break in the skin on your feet, keep it and the skin around it clean and dry. These areas may be cleansed  with mild soap and water. Do not cleanse the area with peroxide, alcohol, or iodine.  When you remove an adhesive bandage, be sure not to damage the skin around it.  If you have a wound, look at it several times a day to make sure it is healing.  Do not use heating pads or hot water bottles. They may burn your skin. If you have lost feeling in your feet or legs, you may not know it is happening until it is too late.  Make sure your health care provider performs a complete foot exam at least annually or more often if you have foot problems. Report any cuts, sores, or bruises to your health care provider immediately. Contact a health care provider if:  You have an injury that is not healing.  You have cuts or breaks in the skin.  You have an ingrown nail.  You notice redness on your legs or feet.  You feel burning or tingling in your legs or feet.  You have pain or cramps in your legs and feet.  Your legs or feet are numb.  Your feet always feel cold. Get help right away if:  There is increasing redness, swelling, or pain in or around a wound.  There is a red line that goes up your leg.  Pus is coming from a wound.  You develop a fever or as directed by your health care provider.  You notice a bad smell coming from an ulcer or wound. This information is not intended to replace advice given to you by your health care provider. Make sure you discuss any questions you have with your health care provider. Document Released: 04/10/2000 Document Revised: 09/19/2015 Document Reviewed: 09/20/2012 Elsevier Interactive Patient Education  2017 [employer].

## 2017-03-12 NOTE — Progress Notes (Signed)
Subjective:    Patient ID: Frank Schneider, male    DOB: [birth year], 41 y.o.   MRN: [id number]  [name], a 41 year old male with a history of type 2 diabetes, CVA, and hypertension presents for follow of of chronic conditions.   He says that he has been taking medications consistently. He does not follow a lowfat, carbohydrate modified diet consistently. He says that it has been difficult to fall asleep so he is generally up at night and eats throughout the night. He often naps during the day. He has not been checking blood sugars consistently.  He maintains that foot paresthesias have improved and denies polyuria, polydipsia, or polyphagia.   Patient also has a history of hypertension and did not take antihypertensive medication prior to arrival.  He does not follow a low-sodium, low-fat diet and does not exercise routinely.  Frank Schneider's cardiovascular risk factors include: Chronic every day tobacco use and history of CVA.  Patient has attempted smoking cessation in the past without prolonged success.  He continues to take daily Plavix and anti-cholesterol medications as a stroke preventative.  Patient also has a history of a right cataract and is under the care of ophthalmology.  He states that he has been lost to follow-up over the past several months.  Patient was scheduled to have a surgery but decided to hold off because he was told that there would only be a small chance of restoring vision to his right eye.    Past Medical History:  Diagnosis Date  . Blind right eye   . Diabetes mellitus    Takes Metformin  . Hypertension   . Priapism   . Stroke ([place])   . Tobacco use   . Vision abnormalities    Allergies as of 03/12/2017   No Known Allergies     Medication List        Accurate as of 03/12/17 12:31 PM. Always use your most recent med list.          amLODipine 10 MG tablet Commonly known as:  NORVASC Take 1 tablet (10 mg total) daily by mouth.   atropine 1 %  ophthalmic ointment Place 1 application into the right eye daily.   clopidogrel 75 MG tablet Commonly known as:  PLAVIX Take 1 tablet (75 mg total) daily by mouth.   fenofibrate 145 MG tablet Commonly known as:  TRICOR Take 1 tablet (145 mg total) daily by mouth.   ferrous sulfate 325 (65 FE) MG tablet Take 1 tablet (325 mg total) daily with breakfast by mouth.   freestyle lancets Use as instructed   gabapentin 300 MG capsule Commonly known as:  NEURONTIN Take 1 capsule (300 mg total) 4 (four) times daily by mouth.   glucose blood test strip Commonly known as:  TRUE METRIX BLOOD GLUCOSE TEST Use as instructed   Insulin Syringe-Needle U-100 31G X 5/16" 1 ML Misc Commonly known as:  TRUEPLUS INSULIN SYRINGE USE AS DIRECTED.   LANTUS 100 UNIT/ML injection Generic drug:  insulin glargine INJECT 20 UNITS INTO THE SKIN AT BEDTIME.   lisinopril 20 MG tablet Commonly known as:  PRINIVIL,ZESTRIL Take 1 tablet (20 mg total) by mouth daily.   sildenafil 25 MG tablet Commonly known as:  VIAGRA Take 1 tablet (25 mg total) by mouth daily as needed for erectile dysfunction.   tobramycin 0.3 % ophthalmic ointment Commonly known as:  TOBREX Place 1 application into the right eye 3 (three) times daily.  TRUE METRIX AIR GLUCOSE METER Devi 1 each by Does not apply route 2 (two) times daily at 10 AM and 5 PM.   venlafaxine 25 MG tablet Commonly known as:  EFFEXOR Take 1 tablet (25 mg total) 2 (two) times daily with a meal by mouth.      Immunization History  Administered Date(s) Administered  . Influenza,inj,Quad PF,6+ Mos 01/15/2015  . Pneumococcal Polysaccharide-23 07/29/2011  . Tdap 01/15/2015  No Known Allergies Review of Systems  Constitutional: Negative for fatigue, fever and unexpected weight change.  HENT: Negative for sinus pain.   Eyes: Negative for photophobia.  Respiratory: Negative for chest tightness.   Cardiovascular: Negative.  Negative for chest pain,  palpitations and leg swelling.  Gastrointestinal: Negative.  Negative for constipation, diarrhea and nausea.  Endocrine: Negative for cold intolerance, heat intolerance, polydipsia, polyphagia and polyuria.  Genitourinary: Negative for decreased urine volume, frequency, hematuria, testicular pain and urgency.  Musculoskeletal: Positive for back pain.  Skin: Negative.  Negative for pallor, rash and wound.  Allergic/Immunologic: Negative for immunocompromised state.  Neurological: Positive for weakness and numbness (left side). Negative for dizziness, tremors, seizures, facial asymmetry and speech difficulty.  Hematological: Negative.   Psychiatric/Behavioral: Negative for confusion, sleep disturbance and suicidal ideas. The patient is not nervous/anxious.        Objective:   Physical Exam  Constitutional: He is oriented to person, place, and time. He appears well-developed and well-nourished.  HENT:  Head: Normocephalic.  Right Ear: Hearing, tympanic membrane, external ear and ear canal normal.  Left Ear: Hearing, tympanic membrane, external ear and ear canal normal.  Nose: Nose normal.  Mouth/Throat: Uvula is midline, oropharynx is clear and moist and mucous membranes are normal.  Eyes: Conjunctivae, EOM and lids are normal. Right pupil is reactive.  Fundoscopic exam:      The right eye shows no red reflex.       The left eye shows red reflex.    Neck: Trachea normal and normal range of motion. Neck supple. No neck rigidity. Normal range of motion present.  Cardiovascular: Normal rate, regular rhythm, S1 normal, S2 normal, normal heart sounds, intact distal pulses and normal pulses.  Pulmonary/Chest: Effort normal and breath sounds normal.  Abdominal: Soft. Normal appearance and bowel sounds are normal.  Musculoskeletal: Normal range of motion.       Lumbar back: He exhibits pain and spasm.  Neurological: He is alert and oriented to person, place, and time. He has normal reflexes.  He is not disoriented. No cranial nerve deficit. He exhibits normal muscle tone.  Monofilament examination within normal limits  Skin: Skin is warm, dry and intact.  Psychiatric: He has a normal mood and affect. His speech is normal and behavior is normal. Judgment and thought content normal. Cognition and memory are normal.      BP (!) 142/78 (BP Location: Right Arm, Patient Position: Sitting, Cuff Size: Large) Comment: manually  Pulse 72   Temp 98.3 F (36.8 C) (Oral)   Resp 16   Ht 5' 10.5" (1.791 m)   Wt 205 lb (93 kg)   SpO2 99%   BMI 29.00 kg/m  Assessment & Plan:  1. Type 2 diabetes mellitus with other circulatory complication, with long-term current use of insulin (HCC) Hemoglobin A1c has increased to 8.5, which is above goal.  Discussed the importance of medication adherence.  Patient states that he skips doses from time to time.  He is also not following a carbohydrate modified diet.  Discussed  carbohydrate modified diet options at length.  Also recommend low impact cardiovascular exercise 2-3 days/week.  Patient agreed to a brisk walk. - HgB A1c - COMPLETE METABOLIC PANEL WITH GFR - Insulin Syringe-Needle U-100 (TRUEPLUS INSULIN SYRINGE) 31G X 5/16" 1 ML MISC; USE AS DIRECTED.  Dispense: 100 each; Refill: prn  2. Tobacco dependence Patient states that he enjoys smoking and is not ready to quit at this point. Smoking cessation instruction/counseling given:  counseled patient on the dangers of tobacco use, advised patient to stop smoking, and reviewed strategies to maximize success meaning  3. History of CVA (cerebrovascular accident) - clopidogrel (PLAVIX) 75 MG tablet; Take 1 tablet (75 mg total) daily by mouth.  Dispense: 30 tablet; Refill: 5  4. Essential hypertension Patient did not take hypertensive medication prior to arrival.  Blood pressure is at goal. - amLODipine (NORVASC) 10 MG tablet; Take 1 tablet (10 mg total) daily by mouth.  Dispense: 30 tablet; Refill:  5  5. Hyperlipidemia, unspecified hyperlipidemia type  -fenofibrate (TRICOR) 145 MG tablet; Take 1 tablet (145 mg total) daily by mouth.  Dispense: 90 tablet; Refill: 1  6. Iron deficiency anemia, unspecified iron deficiency anemia type - CBC with Differential - ferrous sulfate 325 (65 FE) MG tablet; Take 1 tablet (325 mg total) daily with breakfast by mouth.  Dispense: 30 tablet; Refill: 3  7. Other diabetic neurological complication associated with type 2 diabetes mellitus (HCC) - gabapentin (NEURONTIN) 300 MG capsule; Take 1 capsule (300 mg total) 4 (four) times daily by mouth.  Dispense: 120 capsule; Refill: 3 - venlafaxine (EFFEXOR) 25 MG tablet; Take 1 tablet (25 mg total) 2 (two) times daily with a meal by mouth.  Dispense: 60 tablet; Refill: 5  8. Acute bilateral low back pain without sciatica - ketorolac (TORADOL) injection 60 mg   RTC: 3 months for chronic conditions   Donia Pounds  MSN, FNP-C Patient [facility] [address] [place], [place] [address] [phone]

## 2017-03-15 ENCOUNTER — Telehealth: Payer: Self-pay

## 2017-03-15 NOTE — Telephone Encounter (Signed)
Called and spoke with patient, advised of mild anemia and asked that he continue ferrous sulfate 325mg  daily. Patient verbalized understanding and I asked that he keep next scheduled appointment. Thanks!

## 2017-03-15 NOTE — Telephone Encounter (Signed)
-----   Message from Dorena Dew, [place] sent at 03/13/2017  7:22 AM EST ----- Regarding: lab results Please inform patient that laboratory values indicate mild anemia. Will continue Ferrous sulfate 325 mg daily. Follow up in office as previously scheduled.   Thanks

## 2017-04-09 MED FILL — GABAPENTIN 300 MG CAPSULE: 300 | 30 days supply | Qty: 120 | Fill #0

## 2017-04-09 MED FILL — AMLODIPINE BESYLATE 10 MG T: 10 | 30 days supply | Qty: 30 | Fill #0

## 2017-04-09 MED FILL — TRUEPLUS SYR 1ML 30GX5/16: 30G X 5/16" | 30 days supply | Qty: 100 | Fill #0

## 2017-04-09 MED FILL — FERROUS SULFATE 325 MG TAB: 325 (65 FE) | 30 days supply | Qty: 30 | Fill #0

## 2017-04-09 MED FILL — VENLAFAXINE HCL 25 MG TAB: 25 | 30 days supply | Qty: 60 | Fill #0

## 2017-04-09 MED FILL — CLOPIDOGREL 75 MG TABLET: 75 | 30 days supply | Qty: 30 | Fill #0

## 2017-04-09 MED FILL — FENOFIBRATE 145 MG TAB: 145 | 30 days supply | Qty: 30 | Fill #0

## 2017-04-09 MED FILL — TRUEPLUS SYR 1ML 30GX5/16": 30G X 5/16" | 30 days supply | Qty: 100 | Fill #0

## 2017-04-12 ENCOUNTER — Telehealth: Payer: Self-pay | Admitting: Family Medicine

## 2017-04-12 DIAGNOSIS — E1165 Type 2 diabetes mellitus with hyperglycemia: Secondary | ICD-10-CM

## 2017-04-12 DIAGNOSIS — IMO0002 Reserved for concepts with insufficient information to code with codable children: Secondary | ICD-10-CM

## 2017-04-12 DIAGNOSIS — Z794 Long term (current) use of insulin: Principal | ICD-10-CM

## 2017-04-12 DIAGNOSIS — E118 Type 2 diabetes mellitus with unspecified complications: Secondary | ICD-10-CM

## 2017-04-12 MED ORDER — INSULIN GLARGINE 100 UNIT/ML ~~LOC~~ SOLN: SUBCUTANEOUS | 11 refills | Status: DC

## 2017-04-12 MED FILL — LISINOPRIL 20 MG TAB: 20 | 30 days supply | Qty: 30 | Fill #3

## 2017-04-12 MED FILL — LANTUS 100 UNITS/ML VIAL: 100 | 28 days supply | Qty: 10 | Fill #0

## 2017-04-12 NOTE — Telephone Encounter (Signed)
This has been sent into pharmacy. Thanks!  

## 2017-04-12 NOTE — Telephone Encounter (Signed)
Patient still waiting for refill on Lantus. Per patient all other meds were refilled but that one was not. Per pharmacy, the rx was expired. Patient uses the [facility]. Please advise.

## 2017-05-24 ENCOUNTER — Other Ambulatory Visit: Payer: Self-pay | Admitting: Family Medicine

## 2017-05-24 ENCOUNTER — Telehealth: Payer: Self-pay

## 2017-05-24 DIAGNOSIS — E1159 Type 2 diabetes mellitus with other circulatory complications: Secondary | ICD-10-CM

## 2017-05-24 DIAGNOSIS — Z794 Long term (current) use of insulin: Principal | ICD-10-CM

## 2017-05-24 NOTE — Telephone Encounter (Signed)
Patient is asking for a new referral to podiatry for diabetic foot care. We have sent him  Before but he now needs a new referral. Can this be done. Please advise. Thanks!

## 2017-05-26 ENCOUNTER — Telehealth: Payer: Self-pay

## 2017-06-09 ENCOUNTER — Encounter: Payer: Self-pay | Admitting: Podiatry

## 2017-06-09 ENCOUNTER — Other Ambulatory Visit: Payer: Self-pay | Admitting: Podiatry

## 2017-06-09 ENCOUNTER — Ambulatory Visit (INDEPENDENT_AMBULATORY_CARE_PROVIDER_SITE_OTHER): Payer: Medicaid Other

## 2017-06-09 ENCOUNTER — Ambulatory Visit (INDEPENDENT_AMBULATORY_CARE_PROVIDER_SITE_OTHER): Payer: Medicaid Other | Admitting: Podiatry

## 2017-06-09 DIAGNOSIS — M2042 Other hammer toe(s) (acquired), left foot: Secondary | ICD-10-CM | POA: Diagnosis not present

## 2017-06-09 DIAGNOSIS — M2041 Other hammer toe(s) (acquired), right foot: Secondary | ICD-10-CM

## 2017-06-09 DIAGNOSIS — M79671 Pain in right foot: Secondary | ICD-10-CM

## 2017-06-09 DIAGNOSIS — Q6689 Other  specified congenital deformities of feet: Secondary | ICD-10-CM | POA: Diagnosis not present

## 2017-06-09 DIAGNOSIS — M7750 Other enthesopathy of unspecified foot: Secondary | ICD-10-CM | POA: Diagnosis not present

## 2017-06-09 DIAGNOSIS — E1140 Type 2 diabetes mellitus with diabetic neuropathy, unspecified: Secondary | ICD-10-CM

## 2017-06-09 DIAGNOSIS — M79672 Pain in left foot: Principal | ICD-10-CM

## 2017-06-09 DIAGNOSIS — E1149 Type 2 diabetes mellitus with other diabetic neurological complication: Secondary | ICD-10-CM | POA: Diagnosis not present

## 2017-06-09 DIAGNOSIS — Q828 Other specified congenital malformations of skin: Secondary | ICD-10-CM

## 2017-06-09 NOTE — Progress Notes (Signed)
Subjective:   Patient ID: Frank Schneider, male   DOB: 42 y.o.   MRN: [id number]   HPI Very unhealthy patient who presents with significant lesion formation plantar aspect of feet left over right with inflammation of the mid arch area right with pain   Review of Systems  All other systems reviewed and are negative.       Objective:  Physical Exam  Constitutional: He appears well-developed and well-nourished.  Cardiovascular: Intact distal pulses.  Pulmonary/Chest: Effort normal.  Musculoskeletal: Normal range of motion.  Neurological: He is alert.  Skin: Skin is warm.  Nursing note and vitals reviewed.   Neurovascular status found to be intact with muscle strength adequate and significant flatfoot deformity.  Exquisite discomfort mid arch area right with inflammation fluid and mild in thickness of the plantar fashion noted patient found to have keratotic lesion plantar left long-term history of diabetes not in good control with neuropathic symptoms.  Patient smokes quarter pack per day and likes to be active     Assessment:  Plantar fasciitis right acute along with keratotic lesion plantar aspect left over right     Plan:  H&P conditions reviewed and explained the importance of keeping sugar under good control.  Sterile sharp debridement of lesions accomplished with no iatrogenic bleeding and injected the right mid arch area 3 mg Kenalog 5 mg Xylocaine and advised on watching the sugar more carefully over the next few days

## 2017-06-14 ENCOUNTER — Encounter: Payer: Self-pay | Admitting: Family Medicine

## 2017-06-14 ENCOUNTER — Ambulatory Visit (INDEPENDENT_AMBULATORY_CARE_PROVIDER_SITE_OTHER): Payer: Medicaid Other | Admitting: Family Medicine

## 2017-06-14 VITALS — BP 126/75 | HR 68 | Temp 98.5°F | Resp 16 | Ht 70.5 in | Wt 202.0 lb

## 2017-06-14 DIAGNOSIS — E785 Hyperlipidemia, unspecified: Secondary | ICD-10-CM

## 2017-06-14 DIAGNOSIS — Z794 Long term (current) use of insulin: Secondary | ICD-10-CM | POA: Diagnosis not present

## 2017-06-14 DIAGNOSIS — E1159 Type 2 diabetes mellitus with other circulatory complications: Secondary | ICD-10-CM

## 2017-06-14 DIAGNOSIS — IMO0002 Reserved for concepts with insufficient information to code with codable children: Secondary | ICD-10-CM

## 2017-06-14 DIAGNOSIS — E118 Type 2 diabetes mellitus with unspecified complications: Secondary | ICD-10-CM | POA: Diagnosis not present

## 2017-06-14 DIAGNOSIS — E1165 Type 2 diabetes mellitus with hyperglycemia: Secondary | ICD-10-CM | POA: Diagnosis not present

## 2017-06-14 DIAGNOSIS — I10 Essential (primary) hypertension: Secondary | ICD-10-CM

## 2017-06-14 LAB — POCT URINALYSIS DIP (DEVICE)
BILIRUBIN URINE: NEGATIVE
GLUCOSE, UA: 250 mg/dL — AB
Hgb urine dipstick: NEGATIVE
KETONES UR: NEGATIVE mg/dL
NITRITE: NEGATIVE
PH: 5.5 (ref 5.0–8.0)
Protein, ur: NEGATIVE mg/dL
Specific Gravity, Urine: >=1.03 (ref 1.005–1.030)
Urobilinogen, UA: 0.2 mg/dL (ref 0.0–1.0)

## 2017-06-14 LAB — POCT GLYCOSYLATED HEMOGLOBIN (HGB A1C): HEMOGLOBIN A1C: 9.8

## 2017-06-14 LAB — GLUCOSE, CAPILLARY: GLUCOSE-CAPILLARY: 283 mg/dL — AB (ref 65–99)

## 2017-06-14 MED ORDER — FENOFIBRATE 145 MG PO TABS: 145.0000 mg | ORAL_TABLET | Freq: Every day | ORAL | 1 refills | Status: DC

## 2017-06-14 MED ORDER — METFORMIN HCL 500 MG PO TABS
500.0000 mg | ORAL_TABLET | Freq: Two times a day (BID) | ORAL | 5 refills | Status: DC
Start: 2017-06-14 — End: 2018-07-12

## 2017-06-14 MED ORDER — AMLODIPINE BESYLATE 10 MG PO TABS: 10.0000 mg | ORAL_TABLET | Freq: Every day | ORAL | 5 refills | Status: DC

## 2017-06-14 MED ORDER — INSULIN GLARGINE 100 UNIT/ML ~~LOC~~ SOLN: SUBCUTANEOUS | 11 refills | Status: DC

## 2017-06-14 MED ORDER — LISINOPRIL 20 MG PO TABS: 20.0000 mg | ORAL_TABLET | Freq: Every day | ORAL | 5 refills | Status: DC

## 2017-06-14 NOTE — Progress Notes (Signed)
Subjective:    Patient ID: Frank Schneider, male    DOB: 1975-10-11, 42 y.o.   MRN: [id number]  Frank Schneider, a 42 year old male with a history of type 2 diabetes, CVA, and hypertension presents for follow of of chronic conditions.   He says that he generally takes prescribed medications consistently. He does not follow a lowfat, carbohydrate modified diet consistently. He says that it has been difficult to fall asleep so he is generally up at night and eats throughout the night. He often naps during the day. He has not been checking blood sugars consistently.  He maintains that foot paresthesias have improved and denies polyuria, polydipsia, or polyphagia.   Patient also has a history of hypertension and did not take antihypertensive medication prior to arrival.  He does not follow a low-sodium, low-fat diet and does not exercise routinely.  [name]'s cardiovascular risk factors include: Chronic every day tobacco use and history of CVA.  Patient has attempted smoking cessation in the past without prolonged success.  He continues to take daily Plavix and anti-cholesterol medications as a stroke preventative.  Patient also has a history of a right cataract and is under the care of ophthalmology.  He is requesting a referral to ophthalmology.  Patient was scheduled to have a surgery previously but decided to hold off because he was told that there would only be a small chance of restoring vision to his right eye. Patient advised to schedule appointment he is established with ophthalmology.     Past Medical History:  Diagnosis Date  . Blind right eye   . Diabetes mellitus    Takes Metformin  . Hypertension   . Priapism   . Stroke ([place])   . Tobacco use   . Vision abnormalities    Allergies as of 06/14/2017   No Known Allergies     Medication List        Accurate as of 06/14/17 11:53 AM. Always use your most recent med list.          amLODipine 10 MG tablet Commonly known as:   NORVASC Take 1 tablet (10 mg total) daily by mouth.   atropine 1 % ophthalmic ointment Place 1 application into the right eye daily.   clopidogrel 75 MG tablet Commonly known as:  PLAVIX Take 1 tablet (75 mg total) daily by mouth.   fenofibrate 145 MG tablet Commonly known as:  TRICOR Take 1 tablet (145 mg total) daily by mouth.   ferrous sulfate 325 (65 FE) MG tablet Take 1 tablet (325 mg total) daily with breakfast by mouth.   freestyle lancets Use as instructed   gabapentin 300 MG capsule Commonly known as:  NEURONTIN Take 1 capsule (300 mg total) 4 (four) times daily by mouth.   glucose blood test strip Commonly known as:  TRUE METRIX BLOOD GLUCOSE TEST Use as instructed   insulin glargine 100 UNIT/ML injection Commonly known as:  LANTUS INJECT 30 UNITS INTO THE SKIN AT BEDTIME.   Insulin Syringe-Needle U-100 31G X 5/16" 1 ML Misc Commonly known as:  TRUEPLUS INSULIN SYRINGE USE AS DIRECTED.   lisinopril 20 MG tablet Commonly known as:  PRINIVIL,ZESTRIL Take 1 tablet (20 mg total) by mouth daily.   metFORMIN 500 MG tablet Commonly known as:  GLUCOPHAGE Take 1 tablet (500 mg total) by mouth 2 (two) times daily with a meal.   sildenafil 25 MG tablet Commonly known as:  VIAGRA Take 1 tablet (25 mg total) by  mouth daily as needed for erectile dysfunction.   tobramycin 0.3 % ophthalmic ointment Commonly known as:  TOBREX Place 1 application into the right eye 3 (three) times daily.   TRUE METRIX AIR GLUCOSE METER Devi 1 each by Does not apply route 2 (two) times daily at 10 AM and 5 PM.   venlafaxine 25 MG tablet Commonly known as:  EFFEXOR Take 1 tablet (25 mg total) 2 (two) times daily with a meal by mouth.      Immunization History  Administered Date(s) Administered  . Influenza,inj,Quad PF,6+ Mos 01/15/2015  . Pneumococcal Polysaccharide-23 07/29/2011  . Tdap 01/15/2015  No Known Allergies Review of Systems  Constitutional: Negative for fatigue,  fever and unexpected weight change.  HENT: Negative for sinus pain.   Eyes: Negative for photophobia.  Respiratory: Negative for chest tightness.   Cardiovascular: Negative.  Negative for chest pain, palpitations and leg swelling.  Gastrointestinal: Negative.  Negative for constipation, diarrhea and nausea.  Endocrine: Negative for cold intolerance, heat intolerance, polydipsia, polyphagia and polyuria.  Genitourinary: Negative for decreased urine volume, frequency, hematuria, testicular pain and urgency.  Musculoskeletal: Negative.   Skin: Negative.  Negative for pallor, rash and wound.  Allergic/Immunologic: Negative for immunocompromised state.  Neurological: Positive for numbness (left side). Negative for dizziness, tremors, seizures, facial asymmetry and speech difficulty.  Hematological: Negative.   Psychiatric/Behavioral: Negative for confusion, sleep disturbance and suicidal ideas. The patient is not nervous/anxious.        Objective:   Physical Exam  Constitutional: He is oriented to person, place, and time. He appears well-developed and well-nourished.  HENT:  Head: Normocephalic.  Right Ear: Hearing, tympanic membrane, external ear and ear canal normal.  Left Ear: Hearing, tympanic membrane, external ear and ear canal normal.  Nose: Nose normal.  Mouth/Throat: Uvula is midline, oropharynx is clear and moist and mucous membranes are normal.  Eyes: Conjunctivae, EOM and lids are normal. Right pupil is reactive.  Fundoscopic exam:      The right eye shows no red reflex.       The left eye shows red reflex.    Neck: Trachea normal and normal range of motion. Neck supple. No neck rigidity. Normal range of motion present.  Cardiovascular: Normal rate, regular rhythm, S1 normal, S2 normal, normal heart sounds, intact distal pulses and normal pulses.  Pulmonary/Chest: Effort normal and breath sounds normal.  Abdominal: Soft. Normal appearance and bowel sounds are normal.    Musculoskeletal: Normal range of motion.       Lumbar back: He exhibits pain and spasm.  Neurological: He is alert and oriented to person, place, and time. He has normal reflexes. He is not disoriented. No cranial nerve deficit. He exhibits normal muscle tone.  Monofilament examination within normal limits  Skin: Skin is warm, dry and intact.  Psychiatric: He has a normal mood and affect. His speech is normal and behavior is normal. Judgment and thought content normal. Cognition and memory are normal.      BP 126/75 (BP Location: Right Arm, Patient Position: Sitting, Cuff Size: Normal)   Pulse 68   Temp 98.5 F (36.9 C) (Oral)   Resp 16   Ht 5' 10.5" (1.791 m)   Wt 202 lb (91.6 kg)   SpO2 100%   BMI 28.57 kg/m  Assessment & Plan:  Uncontrolled type 2 diabetes mellitus with complication, with long-term current use of insulin (HCC) Hemoglobin a1C is 9.8, which is above goal. Will increase lantus to 30 units at  bedtime.  Will add Metformin 500 mg BID.  Goal is < 7. Recommend a lowfat, low carbohydrate diet divided over 5-6 small meals, increase water intake to 6-8 glasses, and 150 minutes per week of cardiovascular exercise.    - HgB A1c - POCT urinalysis dip (device) - Glucose, capillary - insulin glargine (LANTUS) 100 UNIT/ML injection; INJECT 30 UNITS INTO THE SKIN AT BEDTIME.  Dispense: 10 mL; Refill: 11 - metFORMIN (GLUCOPHAGE) 500 MG tablet; Take 1 tablet (500 mg total) by mouth 2 (two) times daily with a meal.  Dispense: 60 tablet; Refill: 5 - Basic Metabolic Panel   Essential hypertension Blood pressure is at goal on current medication regimen.  Continue medication, monitor blood pressure at home. Continue DASH diet. Reminder to go to the ER if any CP, SOB, nausea, dizziness, severe HA, changes vision/speech, left arm numbness and tingling and jaw pain.  - lisinopril (PRINIVIL,ZESTRIL) 20 MG tablet; Take 1 tablet (20 mg total) by mouth daily.  Dispense: 30 tablet; Refill:  5 - amLODipine (NORVASC) 10 MG tablet; Take 1 tablet (10 mg total) by mouth daily.  Dispense: 30 tablet; Refill: 5  Hyperlipidemia, unspecified hyperlipidemia type - fenofibrate (TRICOR) 145 MG tablet; Take 1 tablet (145 mg total) by mouth daily.  Dispense: 90 tablet; Refill: 1    RTC: 3 months for DMII   Donia Pounds  MSN, FNP-C Patient [facility] [address] [place], [place] [address] [phone]

## 2017-06-14 NOTE — Patient Instructions (Signed)
Your hemoglobin A1c has increased to 9.8.  Will increase Lantus to 30 units at bedtime and add metformin 500 mg twice daily with meals.  Recommend a low-fat, low carbohydrate diet divided over several small meals throughout the day.  Also, increase your water intake to 3-4 bottles per day. Your blood pressure is at goal on current medication regimen, no changes warranted on today.  Will continue lisinopril 20 mg daily.- Continue medication, monitor blood pressure at home. Continue DASH diet. Reminder to go to the ER if any CP, SOB, nausea, dizziness, severe HA, changes vision/speech, left arm numbness and tingling and jaw pain.   Continue to work on decreasing smoking.   All other medications have been sent electronically to your pharmacy.

## 2017-06-15 ENCOUNTER — Telehealth: Payer: Self-pay

## 2017-06-15 ENCOUNTER — Other Ambulatory Visit: Payer: Self-pay

## 2017-06-15 DIAGNOSIS — E118 Type 2 diabetes mellitus with unspecified complications: Principal | ICD-10-CM

## 2017-06-15 DIAGNOSIS — IMO0002 Reserved for concepts with insufficient information to code with codable children: Secondary | ICD-10-CM

## 2017-06-15 DIAGNOSIS — E1165 Type 2 diabetes mellitus with hyperglycemia: Secondary | ICD-10-CM

## 2017-06-15 DIAGNOSIS — Z794 Long term (current) use of insulin: Secondary | ICD-10-CM

## 2017-06-15 LAB — BASIC METABOLIC PANEL
BUN/Creatinine Ratio: 14 (ref 9–20)
BUN: 17 mg/dL (ref 6–24)
CO2: 21 mmol/L (ref 20–29)
CREATININE: 1.2 mg/dL (ref 0.76–1.27)
Calcium: 9.8 mg/dL (ref 8.7–10.2)
Chloride: 101 mmol/L (ref 96–106)
GFR, EST AFRICAN AMERICAN: 86 mL/min/{1.73_m2} (ref >59)
GFR, EST NON AFRICAN AMERICAN: 75 mL/min/{1.73_m2} (ref >59)
Glucose: 248 mg/dL — ABNORMAL HIGH (ref 65–99)
Potassium: 5 mmol/L (ref 3.5–5.2)
SODIUM: 138 mmol/L (ref 134–144)

## 2017-06-15 MED ORDER — TRUE METRIX AIR GLUCOSE METER DEVI: 1.0000 | Freq: Two times a day (BID) | 0 refills | Status: DC

## 2017-06-15 MED ORDER — GLUCOSE BLOOD VI STRP: ORAL_STRIP | 12 refills | Status: DC

## 2017-06-15 NOTE — Telephone Encounter (Signed)
Called, no answer. Left a message for patient to call back and left call back number. Thanks!  

## 2017-06-15 NOTE — Telephone Encounter (Signed)
Patient returned call. I advised that all labs were consistent with baseline and to make sure he starts metformin 500mg  twice daily and increase lantus to 30 units at Bedtime. Asked to keep next scheduled office visit. Patient verbalized understanding. Thanks!

## 2017-06-15 NOTE — Telephone Encounter (Signed)
-----   Message from Dorena Dew, [place] sent at 06/15/2017  6:20 AM EST ----- Regarding: lab results Please inform patient that labs are consistent with baseline. Please adhere to medication changes that were discussed during appointment on yesterday. Metformin 500 mg BID was added and Lantus at bedtime increased to 30 units HS. Will follow up in office as scheduled.   Thanks

## 2017-06-24 MED FILL — metFORMIN HCL 500 MG TABS: 500 | 30 days supply | Qty: 60 | Fill #0

## 2017-06-24 MED FILL — LISINOPRIL 20 MG TAB: 20 | 30 days supply | Qty: 30 | Fill #0

## 2017-06-24 MED FILL — AMLODIPINE BESYLATE 10 MG T: 10 | 30 days supply | Qty: 30 | Fill #0

## 2017-06-24 MED FILL — LANTUS 100 UNITS/ML VIAL: 100 | 28 days supply | Qty: 10 | Fill #0

## 2017-06-24 MED FILL — ACCU-CHEK AVIVA PLUS TEST S: 30 days supply | Qty: 100 | Fill #0

## 2017-06-24 MED FILL — ACCU-CHEK AVIVA PLUS W/DEVI: W/DEVICE | 1 days supply | Qty: 1 | Fill #0

## 2017-06-25 MED FILL — TRUEPLUS SYR 1ML 30GX5/16: 30G X 5/16" | 30 days supply | Qty: 100 | Fill #1

## 2017-06-25 MED FILL — FENOFIBRATE 145 MG TABLET: 145 | 30 days supply | Qty: 30 | Fill #0

## 2017-06-25 MED FILL — TRUEPLUS SYR 1ML 30GX5/16": 30G X 5/16" | 30 days supply | Qty: 100 | Fill #1

## 2017-07-23 ENCOUNTER — Encounter: Payer: Self-pay | Admitting: Family Medicine

## 2017-07-23 ENCOUNTER — Ambulatory Visit (INDEPENDENT_AMBULATORY_CARE_PROVIDER_SITE_OTHER): Payer: Medicaid Other | Admitting: Family Medicine

## 2017-07-23 VITALS — BP 142/76 | HR 67 | Temp 98.5°F | Resp 16 | Ht 70.5 in | Wt 203.0 lb

## 2017-07-23 DIAGNOSIS — G8929 Other chronic pain: Secondary | ICD-10-CM

## 2017-07-23 DIAGNOSIS — M545 Low back pain: Secondary | ICD-10-CM

## 2017-07-23 MED ORDER — KETOROLAC TROMETHAMINE 60 MG/2ML IM SOLN
60.0000 mg | Freq: Once | INTRAMUSCULAR | Status: AC
  Administered 2017-07-23: 60 mg via INTRAMUSCULAR

## 2017-07-23 MED ORDER — MELOXICAM 7.5 MG PO TABS: 7.5000 mg | ORAL_TABLET | Freq: Every day | ORAL | 0 refills | Status: DC

## 2017-07-23 NOTE — Patient Instructions (Signed)
For your low back pain, recommend changing positions frequently throughout the night.  Also apply warm moist compresses to low back as needed.  Use warm compresses interchangeably with cool compresses up to 4 times a day for 20 minutes at a time.  Also will start a trial of meloxicam 7.5 mg daily with food for inflammation associated with chronic back pain.  I will follow-up by phone after reviewing your laboratory results.   Back Exercises If you have pain in your back, do these exercises 2-3 times each day or as told by your doctor. When the pain goes away, do the exercises once each day, but repeat the steps more times for each exercise (do more repetitions). If you do not have pain in your back, do these exercises once each day or as told by your doctor. Exercises Single Knee to Chest  Do these steps 3-5 times in a row for each leg: 1. Lie on your back on a firm bed or the floor with your legs stretched out. 2. Bring one knee to your chest. 3. Hold your knee to your chest by grabbing your knee or thigh. 4. Pull on your knee until you feel a gentle stretch in your lower back. 5. Keep doing the stretch for 10-30 seconds. 6. Slowly let go of your leg and straighten it.  Pelvic Tilt  Do these steps 5-10 times in a row: 1. Lie on your back on a firm bed or the floor with your legs stretched out. 2. Bend your knees so they point up to the ceiling. Your feet should be flat on the floor. 3. Tighten your lower belly (abdomen) muscles to press your lower back against the floor. This will make your tailbone point up to the ceiling instead of pointing down to your feet or the floor. 4. Stay in this position for 5-10 seconds while you gently tighten your muscles and breathe evenly.  Cat-Cow  Do these steps until your lower back bends more easily: 1. Get on your hands and knees on a firm surface. Keep your hands under your shoulders, and keep your knees under your hips. You may put padding under  your knees. 2. Let your head hang down, and make your tailbone point down to the floor so your lower back is round like the back of a cat. 3. Stay in this position for 5 seconds. 4. Slowly lift your head and make your tailbone point up to the ceiling so your back hangs low (sags) like the back of a cow. 5. Stay in this position for 5 seconds.  Press-Ups  Do these steps 5-10 times in a row: 1. Lie on your belly (face-down) on the floor. 2. Place your hands near your head, about shoulder-width apart. 3. While you keep your back relaxed and keep your hips on the floor, slowly straighten your arms to raise the top half of your body and lift your shoulders. Do not use your back muscles. To make yourself more comfortable, you may change where you place your hands. 4. Stay in this position for 5 seconds. 5. Slowly return to lying flat on the floor.  Bridges  Do these steps 10 times in a row: 1. Lie on your back on a firm surface. 2. Bend your knees so they point up to the ceiling. Your feet should be flat on the floor. 3. Tighten your butt muscles and lift your butt off of the floor until your waist is almost as high as your  knees. If you do not feel the muscles working in your butt and the back of your thighs, slide your feet 1-2 inches farther away from your butt. 4. Stay in this position for 3-5 seconds. 5. Slowly lower your butt to the floor, and let your butt muscles relax.  If this exercise is too easy, try doing it with your arms crossed over your chest. Belly Crunches  Do these steps 5-10 times in a row: 1. Lie on your back on a firm bed or the floor with your legs stretched out. 2. Bend your knees so they point up to the ceiling. Your feet should be flat on the floor. 3. Cross your arms over your chest. 4. Tip your chin a little bit toward your chest but do not bend your neck. 5. Tighten your belly muscles and slowly raise your chest just enough to lift your shoulder blades a tiny  bit off of the floor. 6. Slowly lower your chest and your head to the floor.  Back Lifts Do these steps 5-10 times in a row: 1. Lie on your belly (face-down) with your arms at your sides, and rest your forehead on the floor. 2. Tighten the muscles in your legs and your butt. 3. Slowly lift your chest off of the floor while you keep your hips on the floor. Keep the back of your head in line with the curve in your back. Look at the floor while you do this. 4. Stay in this position for 3-5 seconds. 5. Slowly lower your chest and your face to the floor.  Contact a doctor if:  Your back pain gets a lot worse when you do an exercise.  Your back pain does not lessen 2 hours after you exercise. If you have any of these problems, stop doing the exercises. Do not do them again unless your doctor says it is okay. Get help right away if:  You have sudden, very bad back pain. If this happens, stop doing the exercises. Do not do them again unless your doctor says it is okay. This information is not intended to replace advice given to you by your health care provider. Make sure you discuss any questions you have with your health care provider. Document Released: 05/16/2010 Document Revised: 09/19/2015 Document Reviewed: 06/07/2014 Elsevier Interactive Patient Education  [employer].

## 2017-07-23 NOTE — Progress Notes (Signed)
Subjective:    Frank Schneider is a 42 y.o. male with a history of type 2 diabetes mellitus, hypertension, and tobacco dependence  presents for evaluation of low back pain. Patient has had previous back problems. Symptoms have been present over the past 3 months and have been worsening since that time. Tony denies previous back injury. The pain is located in the lumbar spine without radiation.  Pain is characterized as constant and throbbing. Symptoms are exacerbated by lifting, lying down, sitting and standing.  Patient has attempted over-the-counter analgesics without relief.  He denies fever, fatigue, chest pains, dysuria, incontinence, or hematuria. Past Medical History:  Diagnosis Date  . Blind right eye   . Diabetes mellitus    Takes Metformin  . Hypertension   . Priapism   . Stroke ([place])   . Tobacco use   . Vision abnormalities    Social History   Socioeconomic History  . Marital status: Single    Spouse name: Not on file  . Number of children: Not on file  . Years of education: Not on file  . Highest education level: Not on file  Occupational History  . Not on file  Social Needs  . Financial resource strain: Not on file  . Food insecurity:    Worry: Not on file    Inability: Not on file  . Transportation needs:    Medical: Not on file    Non-medical: Not on file  Tobacco Use  . Smoking status: Current Some Day Smoker    Packs/day: 0.25    Years: 8.00    Pack years: 2.00    Types: Cigarettes  . Smokeless tobacco: Never Used  Substance and Sexual Activity  . Alcohol use: No  . Drug use: No    Comment: 3x wk  . Sexual activity: Not on file  Lifestyle  . Physical activity:    Days per week: Not on file    Minutes per session: Not on file  . Stress: Not on file  Relationships  . Social connections:    Talks on phone: Not on file    Gets together: Not on file    Attends religious service: Not on file    Active member of club or organization: Not on file   Attends meetings of clubs or organizations: Not on file    Relationship status: Not on file  . Intimate partner violence:    Fear of current or ex partner: Not on file    Emotionally abused: Not on file    Physically abused: Not on file    Forced sexual activity: Not on file  Other Topics Concern  . Not on file  Social History Narrative  . Not on file   Immunization History  Administered Date(s) Administered  . Influenza,inj,Quad PF,6+ Mos 01/15/2015  . Pneumococcal Polysaccharide-23 07/29/2011  . Tdap 01/15/2015   Review of Systems  Constitutional: Negative.   HENT: Negative.   Respiratory: Negative.   Cardiovascular: Negative.  Negative for chest pain and palpitations.  Gastrointestinal: Negative.   Genitourinary: Negative.  Negative for dysuria, frequency, hematuria and urgency.  Musculoskeletal: Positive for back pain.  Skin: Negative.   Neurological: Negative.  Negative for dizziness, weakness and headaches.  Psychiatric/Behavioral: Negative.      Objective:    Physical Exam  Constitutional: He is oriented to person, place, and time.  Cardiovascular: Normal rate, regular rhythm, normal heart sounds and intact distal pulses.  Pulmonary/Chest: Effort normal and breath sounds normal.  Abdominal:  Soft. Bowel sounds are normal.  Musculoskeletal:       Lumbar back: He exhibits decreased range of motion and pain. He exhibits no tenderness and no spasm.  Neurological: He is alert and oriented to person, place, and time.    Assessment:    Nonspecific acute low back pain    Plan:    Chronic bilateral low back pain without sciatica - DG Lumbar Spine Complete; Future - ketorolac (TORADOL) injection 60 mg - meloxicam (MOBIC) 7.5 MG tablet; Take 1 tablet (7.5 mg total) by mouth daily.  Dispense: 30 tablet; Refill: 0 Proper lifting, bending technique discussed. Stretching exercises discussed. Ice to affected area as needed for local pain relief. Heat to affected area as  needed for local pain relief. NSAIDs per medication orders.     The patient was given clear instructions to go to ER or return to medical center if symptoms do not improve, worsen or new problems develop. The patient verbalized understanding.   Donia Pounds  MSN, FNP-C [facility] [address] [place], [place] [id number] [phone]

## 2017-07-26 LAB — POCT URINALYSIS DIP (DEVICE)
Bilirubin Urine: NEGATIVE
Glucose, UA: NEGATIVE mg/dL
KETONES UR: NEGATIVE mg/dL
Nitrite: NEGATIVE
PH: 5.5 (ref 5.0–8.0)
PROTEIN: NEGATIVE mg/dL
Specific Gravity, Urine: >=1.03 (ref 1.005–1.030)
Urobilinogen, UA: 0.2 mg/dL (ref 0.0–1.0)

## 2017-09-01 ENCOUNTER — Ambulatory Visit (INDEPENDENT_AMBULATORY_CARE_PROVIDER_SITE_OTHER): Payer: Medicaid Other | Admitting: Podiatry

## 2017-09-01 ENCOUNTER — Encounter: Payer: Self-pay | Admitting: Podiatry

## 2017-09-01 DIAGNOSIS — B351 Tinea unguium: Secondary | ICD-10-CM | POA: Diagnosis not present

## 2017-09-01 DIAGNOSIS — M2041 Other hammer toe(s) (acquired), right foot: Secondary | ICD-10-CM | POA: Diagnosis not present

## 2017-09-01 DIAGNOSIS — Q828 Other specified congenital malformations of skin: Secondary | ICD-10-CM

## 2017-09-01 DIAGNOSIS — E1140 Type 2 diabetes mellitus with diabetic neuropathy, unspecified: Secondary | ICD-10-CM

## 2017-09-01 DIAGNOSIS — M2042 Other hammer toe(s) (acquired), left foot: Secondary | ICD-10-CM | POA: Diagnosis not present

## 2017-09-01 DIAGNOSIS — M79675 Pain in left toe(s): Secondary | ICD-10-CM | POA: Diagnosis not present

## 2017-09-01 DIAGNOSIS — E1149 Type 2 diabetes mellitus with other diabetic neurological complication: Secondary | ICD-10-CM

## 2017-09-01 DIAGNOSIS — M79674 Pain in right toe(s): Secondary | ICD-10-CM | POA: Diagnosis not present

## 2017-09-01 NOTE — Progress Notes (Signed)
Subjective:   Patient ID: Frank Schneider, male   DOB: 42 y.o.   MRN: [id number]   HPI Patient presents with severe hammertoe deformities bilateral lesions plantar aspect both feet that are very painful and nail disease 1-5 both feet that are thick with patient stating that his sugar is not been very good control and he cannot take care of his feet properly.  Stated it did help we did last time   ROS      Objective:  Physical Exam  Significant hammertoe deformity with rigid contracture lesser digits bilateral with distal keratotic lesion formation and severe nail disease 1-5 both feet that are thick yellow brittle and impossible for him to 5 and become painful     Assessment:  Hammertoe deformity bilateral with at risk patient with mycotic nail infection and lesion formation     Plan:  Debrided nailbeds 1-5 both feet and debrided lesions with no iatrogenic bleeding noted and discussed hammertoe with possibility for correction at one point in future

## 2017-09-08 MED FILL — MELOXICAM 7.5 MG TABLET: 7.5 | 30 days supply | Qty: 30 | Fill #0

## 2017-09-09 MED FILL — LISINOPRIL 20 MG TAB: 20 | 30 days supply | Qty: 30 | Fill #1

## 2017-09-09 MED FILL — LANTUS 100 UNITS/ML VIAL: 100 | 28 days supply | Qty: 10 | Fill #1

## 2017-09-13 ENCOUNTER — Ambulatory Visit: Payer: Medicaid Other | Admitting: Family Medicine

## 2017-09-15 ENCOUNTER — Ambulatory Visit: Payer: Medicaid Other | Admitting: Family Medicine

## 2017-09-17 ENCOUNTER — Encounter (HOSPITAL_COMMUNITY): Payer: Self-pay | Admitting: Emergency Medicine

## 2017-09-17 ENCOUNTER — Emergency Department (HOSPITAL_COMMUNITY)
Admission: EM | Admit: 2017-09-17 | Discharge: 2017-09-17 | Disposition: A | Discharge status: Home / Self Care | Payer: Medicaid Other | Attending: Emergency Medicine | Admitting: Emergency Medicine

## 2017-09-17 ENCOUNTER — Other Ambulatory Visit: Payer: Self-pay

## 2017-09-17 DIAGNOSIS — F17210 Nicotine dependence, cigarettes, uncomplicated: Secondary | ICD-10-CM | POA: Insufficient documentation

## 2017-09-17 DIAGNOSIS — E1159 Type 2 diabetes mellitus with other circulatory complications: Secondary | ICD-10-CM | POA: Diagnosis not present

## 2017-09-17 DIAGNOSIS — Z794 Long term (current) use of insulin: Secondary | ICD-10-CM | POA: Insufficient documentation

## 2017-09-17 DIAGNOSIS — G43819 Other migraine, intractable, without status migrainosus: Secondary | ICD-10-CM | POA: Diagnosis not present

## 2017-09-17 DIAGNOSIS — I10 Essential (primary) hypertension: Secondary | ICD-10-CM | POA: Insufficient documentation

## 2017-09-17 DIAGNOSIS — Z7902 Long term (current) use of antithrombotics/antiplatelets: Secondary | ICD-10-CM | POA: Diagnosis not present

## 2017-09-17 DIAGNOSIS — H5711 Ocular pain, right eye: Secondary | ICD-10-CM | POA: Diagnosis present

## 2017-09-17 MED ORDER — PROCHLORPERAZINE EDISYLATE 10 MG/2ML IJ SOLN
10.0000 mg | Freq: Once | INTRAMUSCULAR | Status: AC
  Administered 2017-09-17: 10 mg via INTRAVENOUS
  Filled 2017-09-17: qty 2

## 2017-09-17 MED ORDER — FLUORESCEIN SODIUM 1 MG OP STRP
1.0000 | ORAL_STRIP | Freq: Once | OPHTHALMIC | Status: AC
  Administered 2017-09-17: 1 via OPHTHALMIC
  Filled 2017-09-17: qty 1

## 2017-09-17 MED ORDER — TETRACAINE HCL 0.5 % OP SOLN
1.0000 [drp] | Freq: Once | OPHTHALMIC | Status: AC
  Administered 2017-09-17: 1 [drp] via OPHTHALMIC
  Filled 2017-09-17: qty 4

## 2017-09-17 MED ORDER — DIPHENHYDRAMINE HCL 50 MG/ML IJ SOLN
25.0000 mg | Freq: Once | INTRAMUSCULAR | Status: AC
  Administered 2017-09-17: 25 mg via INTRAVENOUS
  Filled 2017-09-17: qty 1

## 2017-09-17 MED ORDER — SODIUM CHLORIDE 0.9 % IV BOLUS
1000.0000 mL | Freq: Once | INTRAVENOUS | Status: AC
  Administered 2017-09-17: 1000 mL via INTRAVENOUS

## 2017-09-17 NOTE — ED Provider Notes (Signed)
[facility] DEPT Provider Note   CSN: [id number] Arrival date & time: 09/17/17  0730     History   Chief Complaint Chief Complaint  Patient presents with  . Eye Pain    HPI Frank Schneider is a 41 y.o. 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  Patient had cataract surgery completed on the right eye approximately 1 year ago.  Since then he has had this intermittent pain exacerbating his headaches.  He uses ketorolac and brimonidine ophthalmic drops to help with his symptoms.  He states this usually helps with the pain.  However he is also had a headache which is preventing the eye pain from improving.  Also reports mild foreign body sensation for the past several months in his right eye.  He use the ketorolac drops yesterday.  He has not had any other medications to help with his headaches.  States that the headache is similar to his prior headaches, located on the front of his head and behind his eyes.  He denies any head injuries or falls, trauma to the area, numbness in arms or legs, fevers, neck pain, nausea, vomiting.   HPI  Past Medical History:  Diagnosis Date  . Blind right eye   . Diabetes mellitus    Takes Metformin  . Hypertension   . Priapism   . Stroke ([place])   . Tobacco use   . Vision abnormalities     Patient Active Problem List   Diagnosis Date Noted  . Acute appendicitis 12/16/2015  . Right cataract 10/04/2015  . Tobacco dependence 10/04/2015  . Absolute anemia 08/07/2015  . Neuropathy 08/07/2015  . Cerebrovascular accident (CVA) ([place])   . Leukocytosis   . Stroke (cerebrum) ([place]) 05/09/2015  . Type 2 diabetes mellitus with circulatory disorder ([place]) 03/12/2015  . Hyperlipidemia 02/14/2015  . History of CVA (cerebrovascular accident) 02/14/2015  . CVA (cerebral vascular  accident) ([place]) 01/01/2015  . Diabetes mellitus type 2 with complications, uncontrolled ([place]) 01/01/2015  . Stroke ([place])   . Essential hypertension   . Smoker   . Tobacco use     Past Surgical History:  Procedure Laterality Date  . APPENDECTOMY    . EYE SURGERY     cataract removed 05/27/2016 right eye   . INCISION AND DRAINAGE PERIRECTAL ABSCESS  07/28/2011   Procedure: IRRIGATION AND DEBRIDEMENT PERIRECTAL ABSCESS;  Surgeon: Adin Hector, MD;  Location: [facility] ORS;  Service: General;  Laterality: N/A;   of skin muscle and subcutaneous tissue of perimeum  8cmx12cm area   . IR GENERIC HISTORICAL  01/08/2016   IR RADIOLOGIST EVAL & MGMT 01/08/2016 [facility] INTERV RAD  . LAPAROSCOPIC APPENDECTOMY N/A 12/16/2015   Procedure: APPENDECTOMY LAPAROSCOPIC;  Surgeon: Coralie Keens, MD;  Location: [facility] ORS;  Service: General;  Laterality: N/A;  . PENECTOMY          Home Medications    Prior to Admission medications   Medication Sig Start Date End Date Taking? Authorizing Provider  amLODipine (NORVASC) 10 MG tablet Take 1 tablet (10 mg total) by mouth daily. 06/14/17   Dorena Dew, FNP  atropine 1 % ophthalmic ointment Place 1 application into the right eye daily. 09/06/16   Deno Etienne, DO  Blood Glucose Monitoring Suppl (TRUE METRIX AIR GLUCOSE METER) DEVI 1 each by Does not apply route  2 (two) times daily at 10 AM and 5 PM. 06/15/17   Dorena Dew, FNP  clopidogrel (PLAVIX) 75 MG tablet Take 1 tablet (75 mg total) daily by mouth. 03/12/17   Dorena Dew, FNP  fenofibrate (TRICOR) 145 MG tablet Take 1 tablet (145 mg total) by mouth daily. 06/14/17   Dorena Dew, FNP  ferrous sulfate 325 (65 FE) MG tablet Take 1 tablet (325 mg total) daily with breakfast by mouth. 03/12/17   Dorena Dew, FNP  gabapentin (NEURONTIN) 300 MG capsule Take 1 capsule (300 mg total) 4 (four) times daily by mouth. 03/12/17   Dorena Dew, FNP  glucose blood (TRUE METRIX BLOOD GLUCOSE TEST)  test strip Use as instructed 06/15/17   Dorena Dew, FNP  insulin glargine (LANTUS) 100 UNIT/ML injection INJECT 30 UNITS INTO THE SKIN AT BEDTIME. 06/14/17   Dorena Dew, FNP  Insulin Syringe-Needle U-100 (TRUEPLUS INSULIN SYRINGE) 31G X 5/16" 1 ML MISC USE AS DIRECTED. 03/12/17   Dorena Dew, FNP  Lancets (FREESTYLE) lancets Use as instructed 01/03/15   Reyne Dumas, MD  lisinopril (PRINIVIL,ZESTRIL) 20 MG tablet Take 1 tablet (20 mg total) by mouth daily. 06/14/17   Dorena Dew, FNP  meloxicam (MOBIC) 7.5 MG tablet Take 1 tablet (7.5 mg total) by mouth daily. 07/23/17   Dorena Dew, FNP  metFORMIN (GLUCOPHAGE) 500 MG tablet Take 1 tablet (500 mg total) by mouth 2 (two) times daily with a meal. 06/14/17   Dorena Dew, FNP  sildenafil (VIAGRA) 25 MG tablet Take 1 tablet (25 mg total) by mouth daily as needed for erectile dysfunction. 11/09/16   Dorena Dew, FNP  tobramycin (TOBREX) 0.3 % ophthalmic ointment Place 1 application into the right eye 3 (three) times daily. 09/06/16   Deno Etienne, DO  venlafaxine (EFFEXOR) 25 MG tablet Take 1 tablet (25 mg total) 2 (two) times daily with a meal by mouth. 03/12/17   Dorena Dew, FNP    Family History Family History  Problem Relation Age of Onset  . Diabetes Mother   . Hypertension Mother   . Diabetes Maternal Grandmother   . Hypertension Maternal Grandmother   . Depression Maternal Grandmother     Social History Social History   Tobacco Use  . Smoking status: Current Some Day Smoker    Packs/day: 0.25    Years: 8.00    Pack years: 2.00    Types: Cigarettes  . Smokeless tobacco: Never Used  Substance Use Topics  . Alcohol use: No  . Drug use: No    Comment: 3x wk     Allergies   Patient has no known allergies.   Review of Systems Review of Systems  Constitutional: Negative for appetite change, chills and fever.  HENT: Negative for ear pain, rhinorrhea, sneezing and sore throat.   Eyes:  Positive for pain. Negative for photophobia and visual disturbance.  Respiratory: Negative for cough, chest tightness, shortness of breath and wheezing.   Cardiovascular: Negative for chest pain and palpitations.  Gastrointestinal: Negative for abdominal pain, blood in stool, constipation, diarrhea, nausea and vomiting.  Genitourinary: Negative for dysuria, hematuria and urgency.  Musculoskeletal: Negative for myalgias.  Skin: Negative for rash.  Neurological: Positive for headaches. Negative for dizziness, weakness and light-headedness.     Physical Exam Updated Vital Signs BP (!) 160/78   Pulse (!) 53   Temp 99.2 F (37.3 C) (Oral)   Resp 19   SpO2 97%  Physical Exam  Constitutional: He is oriented to person, place, and time. He appears well-developed and well-nourished. No distress.  HENT:  Head: Normocephalic and atraumatic.  Nose: Nose normal.  Eyes: Conjunctivae and EOM are normal. Right eye exhibits discharge. Left eye exhibits no discharge. No scleral icterus. Right eye exhibits normal extraocular motion. Left eye exhibits normal extraocular motion. Right pupil is not reactive.  Right eye hazy appearing.  Pupil is dilated and fixed.  It is nonresponsive to light.  Mild clear tearful drainage noted.  Left pupil is reactive and responsive to light.  No pain with EOMs.  EOMs are intact.  No periorbital edema, proptosis, consensual photophobia noted. Fluorescein stain with no corneal abrasions, foreign bodies, dendritic lesions, ulcerations, negative Sidel sign.  Neck: Normal range of motion. Neck supple.  Cardiovascular: Normal rate, regular rhythm, normal heart sounds and intact distal pulses. Exam reveals no gallop and no friction rub.  No murmur heard. Pulmonary/Chest: Effort normal and breath sounds normal. No respiratory distress.  Abdominal: Soft. Bowel sounds are normal. He exhibits no distension. There is no tenderness. There is no guarding.  Musculoskeletal: Normal  range of motion. He exhibits no edema.  Neurological: He is alert and oriented to person, place, and time. No cranial nerve deficit or sensory deficit. He exhibits normal muscle tone. Coordination normal.  No facial asymmetry noted. Sensation intact to light touch on face, BUE and BLE. Strength 5/5 in BUE and BLE. Normal patellar reflexes bilaterally.  Skin: Skin is warm and dry. No rash noted.  Psychiatric: He has a normal mood and affect.  Nursing note and vitals reviewed.    ED Treatments / Results  Labs (all labs ordered are listed, but only abnormal results are displayed) Labs Reviewed - No data to display  EKG None  Radiology No results found.  Procedures Procedures (including critical care time)  Medications Ordered in ED Medications  tetracaine (PONTOCAINE) 0.5 % ophthalmic solution 1 drop (1 drop Right Eye Given 09/17/17 0827)  fluorescein ophthalmic strip 1 strip (1 strip Right Eye Given 09/17/17 0828)  prochlorperazine (COMPAZINE) injection 10 mg (10 mg Intravenous Given 09/17/17 0837)  diphenhydrAMINE (BENADRYL) injection 25 mg (25 mg Intravenous Given 09/17/17 0836)  sodium chloride 0.9 % bolus 1,000 mL (1,000 mLs Intravenous New Bag/Given 09/17/17 0834)     Initial Impression / Assessment and Plan / ED Course  I have reviewed the triage vital signs and the nursing notes.  Pertinent labs & imaging results that were available during my care of the patient were reviewed by me and considered in my medical decision making (see chart for details).     Patient presents to ED for evaluation of recurrent right eye pain and headache.  Patient has been blind in his right eye for the past 2 years.  He had cataract surgery completed 1 year ago.  Since then he has had intermittent pain exacerbating his headaches.  No improvement with ketorolac drops that he used yesterday.  Reports mild foreign body sensation for the past several months.  Denies any changes in headaches, injuries  or falls, trauma to the area, numbness in arms or legs, fevers or neck pain.  On physical exam he is overall well-appearing.  Right pupil is fixed and dilated.  Is not reactive to light.  This appears consistent with his prior exams.  Left eye and pupil are reactive.  Normal EOMs noted bilaterally.  No pain with EOMs noted.  There is no proptosis, periorbital edema, photophobia noted.  No facial asymmetry or weakness noted.  Fluorescein stain unremarkable.  Patient given migraine cocktail to help with headaches, which states has helped him in the past.  On reassessment, patient reports significant improvement in his headache with medications given.  He also reports improvement in his eye discomfort.  Patient states that he is unable to follow-up with his current ophthalmologist due to financial issues.  Will refer him to ophthalmologist on-call for follow-up. There are no headache characteristics that are lateralizing or concerning for increased ICP, infectious or vascular cause of his symptoms.    Advised him to continue his current eyedrop regimen and to return to ED for any severe worsening symptoms.  Portions of this note were generated with [employer]. Dictation errors may occur despite best attempts at proofreading.   Final Clinical Impressions(s) / ED Diagnoses   Final diagnoses:  Other migraine without status migrainosus, intractable    ED Discharge Orders    None       Delia Heady, PA-C 09/17/17 [id number]    Nat Christen, MD 09/22/17 1226

## 2017-09-17 NOTE — Discharge Instructions (Addendum)
Follow-up with your eye doctor or the eye doctor listed below for further evaluation.

## 2017-09-17 NOTE — ED Triage Notes (Signed)
Pt reports that been having recurrent eye pain in right eye after having surgery on it year ago. Pt put in eye drops that he had after surgery that normally helps with pain.

## 2017-09-23 DIAGNOSIS — H3321 Serous retinal detachment, right eye: Secondary | ICD-10-CM | POA: Diagnosis not present

## 2017-09-23 DIAGNOSIS — Z961 Presence of intraocular lens: Secondary | ICD-10-CM | POA: Diagnosis not present

## 2017-09-23 MED FILL — TIMOLOL 0.5% EYE DROPS: 0.5 | 30 days supply | Qty: 5 | Fill #0

## 2017-09-23 MED FILL — BRIMONIDINE 0.2% EYE DROP: 0.2 | 25 days supply | Qty: 5 | Fill #0

## 2017-09-30 ENCOUNTER — Ambulatory Visit (INDEPENDENT_AMBULATORY_CARE_PROVIDER_SITE_OTHER): Payer: Medicaid Other | Admitting: Family Medicine

## 2017-09-30 ENCOUNTER — Other Ambulatory Visit: Payer: Self-pay

## 2017-09-30 ENCOUNTER — Encounter: Payer: Self-pay | Admitting: Family Medicine

## 2017-09-30 VITALS — BP 148/72 | HR 61 | Temp 98.3°F | Ht 70.5 in | Wt 203.6 lb

## 2017-09-30 DIAGNOSIS — Z23 Encounter for immunization: Secondary | ICD-10-CM

## 2017-09-30 DIAGNOSIS — E1165 Type 2 diabetes mellitus with hyperglycemia: Secondary | ICD-10-CM

## 2017-09-30 DIAGNOSIS — I10 Essential (primary) hypertension: Secondary | ICD-10-CM

## 2017-09-30 DIAGNOSIS — IMO0002 Reserved for concepts with insufficient information to code with codable children: Secondary | ICD-10-CM

## 2017-09-30 DIAGNOSIS — F17200 Nicotine dependence, unspecified, uncomplicated: Secondary | ICD-10-CM

## 2017-09-30 DIAGNOSIS — E118 Type 2 diabetes mellitus with unspecified complications: Secondary | ICD-10-CM

## 2017-09-30 DIAGNOSIS — Z794 Long term (current) use of insulin: Secondary | ICD-10-CM | POA: Diagnosis not present

## 2017-09-30 LAB — POCT GLYCOSYLATED HEMOGLOBIN (HGB A1C)

## 2017-09-30 NOTE — Progress Notes (Signed)
Subjective:  Frank Schneider, a very pleasant 42 year old male with a history of uncontrolled type 2 diabetes mellitus, essential hypertension, and history of CVA presents for follow-up of chronic conditions.  Patient states that he has been taking all prescribed medications consistently.  He does not exercise routinely or follow a low-fat, low carbohydrate diet.  He checks his CBGs at home frequently and states that after meals, most CBGs are less than 200.  He often forgets to check fasting blood sugars in a.m.  He currently denies blurred vision, polyuria, polydipsia, or polyphagia.  Diabetes  Pertinent negatives for hypoglycemia include no dizziness or headaches. Pertinent negatives for diabetes include no blurred vision, no chest pain, no fatigue, no foot paresthesias, no foot ulcerations, no polydipsia, no polyphagia, no polyuria, no visual change and no weakness. He is compliant with treatment all of the time. His weight is stable. He is following a high fat/cholesterol diet. He rarely participates in exercise. An ACE inhibitor/angiotensin II receptor blocker is being taken. He does not see a podiatrist.Eye exam is not current.  Hypertension  This is a chronic problem. The problem is unchanged. Pertinent negatives include no blurred vision, chest pain, headaches or palpitations. There is no history of chronic renal disease, coarctation of the aorta, hyperaldosteronism, hypercortisolism, hyperparathyroidism, a hypertension causing med, pheochromocytoma, renovascular disease, sleep apnea or a thyroid problem.    Past Medical History:  Diagnosis Date  . Blind right eye   . Diabetes mellitus    Takes Metformin  . Hypertension   . Priapism   . Stroke ([place])   . Tobacco use   . Vision abnormalities    Social History   Socioeconomic History  . Marital status: Single    Spouse name: Not on file  . Number of children: Not on file  . Years of education: Not on file  . Highest education level:  Not on file  Occupational History  . Not on file  Social Needs  . Financial resource strain: Not on file  . Food insecurity:    Worry: Not on file    Inability: Not on file  . Transportation needs:    Medical: Not on file    Non-medical: Not on file  Tobacco Use  . Smoking status: Current Some Day Smoker    Packs/day: 0.25    Years: 8.00    Pack years: 2.00    Types: Cigarettes  . Smokeless tobacco: Never Used  Substance and Sexual Activity  . Alcohol use: No  . Drug use: No    Comment: 3x wk  . Sexual activity: Not on file  Lifestyle  . Physical activity:    Days per week: Not on file    Minutes per session: Not on file  . Stress: Not on file  Relationships  . Social connections:    Talks on phone: Not on file    Gets together: Not on file    Attends religious service: Not on file    Active member of club or organization: Not on file    Attends meetings of clubs or organizations: Not on file    Relationship status: Not on file  . Intimate partner violence:    Fear of current or ex partner: Not on file    Emotionally abused: Not on file    Physically abused: Not on file    Forced sexual activity: Not on file  Other Topics Concern  . Not on file  Social History Narrative  . Not on  file   Immunization History  Administered Date(s) Administered  . Influenza,inj,Quad PF,6+ Mos 01/15/2015  . Pneumococcal Polysaccharide-23 07/29/2011  . Tdap 01/15/2015   Review of Systems  Constitutional: Negative.  Negative for fatigue.  HENT: Negative.   Eyes: Negative for blurred vision.  Respiratory: Negative.   Cardiovascular: Negative.  Negative for chest pain and palpitations.  Gastrointestinal: Negative.   Genitourinary: Negative.  Negative for dysuria, frequency, hematuria and urgency.  Skin: Negative.   Neurological: Negative.  Negative for dizziness, weakness and headaches.  Endo/Heme/Allergies: Negative for polydipsia and polyphagia.  Psychiatric/Behavioral:  Negative.      Objective:    Physical Exam  Constitutional: He is oriented to person, place, and time. He appears well-developed and well-nourished.  Eyes: Pupils are equal, round, and reactive to light.  Cardiovascular: Normal rate, regular rhythm, normal heart sounds and intact distal pulses.  Pulmonary/Chest: Effort normal and breath sounds normal.  Abdominal: Soft. Bowel sounds are normal.  Neurological: He is alert and oriented to person, place, and time.  Skin: Skin is warm and dry.  Psychiatric: He has a normal mood and affect. His behavior is normal. Judgment and thought content normal.    Assessment:   BP (!) 148/72 (BP Location: Right Arm)   Pulse 61   Temp 98.3 F (36.8 C) (Oral)   Ht 5' 10.5" (1.791 m)   Wt 203 lb 9.6 oz (92.4 kg)   SpO2 100%   BMI 28.80 kg/m  Plan:    1. Uncontrolled type 2 diabetes mellitus with complication, with long-term current use of insulin (HCC) Hemoglobin A1c is decreased to 7.4, which continues to be above goal.  No medication changes are warranted on today.  Advised patient to continue carbohydrate modify diet divided over small meals throughout the day. We will review renal functioning as results become available - HgB J2I - Basic Metabolic Panel  2. Essential hypertension Blood pressure is above goal, goal is less than 140/90.  Patient states that he did not take antihypertensive medications prior to arrival.  Stressed the importance of taking medications consistently in order to have positive outcomes. - Basic Metabolic Panel  3. Tobacco dependence Patient is a chronic everyday smoker, he is currently in the pre-contemplative state.  He is tried nicotine patches in the past without success.  Patient counseled at length.  4. Immunization due - Pneumococcal polysaccharide vaccine 23-valent greater than or equal to 2yo subcutaneous/IM    RTC: 3 months for chronic conditions   Donia Pounds  MSN, FNP-C [facility] [address] [place], [place] [address] [phone]

## 2017-09-30 NOTE — Patient Instructions (Signed)
Your A1c is improved to 7.4 from 9.8, no medication changes warranted on today.  Recommend that she continue a carbohydrate modify diet divided over small meals throughout the day. Your blood pressure is at goal on current medication regimen.  - Continue medication, monitor blood pressure at home. Continue DASH diet. Reminder to go to the ER if any CP, SOB, nausea, dizziness, severe HA, changes vision/speech, left arm numbness and tingling and jaw pain.

## 2017-10-01 LAB — BASIC METABOLIC PANEL
BUN / CREAT RATIO: 15 (ref 9–20)
BUN: 13 mg/dL (ref 6–24)
CO2: 25 mmol/L (ref 20–29)
Calcium: 9.7 mg/dL (ref 8.7–10.2)
Chloride: 102 mmol/L (ref 96–106)
Creatinine, Ser: 0.89 mg/dL (ref 0.76–1.27)
GFR calc Af Amer: 123 mL/min/{1.73_m2} (ref >59)
GFR, EST NON AFRICAN AMERICAN: 106 mL/min/{1.73_m2} (ref >59)
GLUCOSE: 148 mg/dL — AB (ref 65–99)
POTASSIUM: 4.8 mmol/L (ref 3.5–5.2)
SODIUM: 141 mmol/L (ref 134–144)

## 2017-11-11 ENCOUNTER — Other Ambulatory Visit: Payer: Self-pay | Admitting: Family Medicine

## 2017-11-11 DIAGNOSIS — E1149 Type 2 diabetes mellitus with other diabetic neurological complication: Secondary | ICD-10-CM

## 2017-11-11 MED FILL — GABAPENTIN 300 MG CAPSULE: 300 | 30 days supply | Qty: 120 | Fill #0

## 2017-11-16 MED FILL — LANTUS 100 UNITS/ML VIAL: 100 | 28 days supply | Qty: 10 | Fill #2

## 2017-11-19 ENCOUNTER — Ambulatory Visit (INDEPENDENT_AMBULATORY_CARE_PROVIDER_SITE_OTHER): Payer: Medicaid Other | Admitting: Family Medicine

## 2017-11-19 ENCOUNTER — Encounter: Payer: Self-pay | Admitting: Family Medicine

## 2017-11-19 VITALS — BP 146/78 | HR 74 | Temp 98.2°F | Resp 16 | Ht 70.5 in | Wt 198.0 lb

## 2017-11-19 DIAGNOSIS — Z794 Long term (current) use of insulin: Secondary | ICD-10-CM

## 2017-11-19 DIAGNOSIS — E1159 Type 2 diabetes mellitus with other circulatory complications: Secondary | ICD-10-CM

## 2017-11-19 DIAGNOSIS — R5383 Other fatigue: Secondary | ICD-10-CM

## 2017-11-19 DIAGNOSIS — E569 Vitamin deficiency, unspecified: Secondary | ICD-10-CM | POA: Diagnosis not present

## 2017-11-19 LAB — POCT GLYCOSYLATED HEMOGLOBIN (HGB A1C): Hemoglobin A1C: 7.5 % — AB (ref 4.0–5.6)

## 2017-11-19 NOTE — Progress Notes (Signed)
Patient presents for sick visit. Hx of DM2. States that he is not taking metformin consistently due to side effect of diarrhea. Patient reports that his FBS are in the 200-300s most days.  He states that he has been feeling dehydrated (thirsty, dizzy, fatigued) for the past week.  Did not check his glucose this AM at home. Last A1c on file is 9.8  He is not compliant with medications.  Home blood sugar records: trend: increasing rapidly  Diabetic Labs Latest Ref Rng & Units 09/30/2017 06/14/2017 03/12/2017  HbA1c - - 9.8 8.5  Chol 0 - 200 mg/dL - - -  HDL >40 mg/dL - - -  Calc LDL 0 - 99 mg/dL - - -  Triglycerides <150 mg/dL - - -  Creatinine 0.76 - 1.27 mg/dL 0.89 1.20 1.02   Foot/eye exam completion dates 09/30/2017 10/04/2015  Foot Form Completion Done Done    No results found for: POCGLU  Diabetes Health Maintenance Due  Topic Date Due  . HEMOGLOBIN A1C  04/01/2018  . OPHTHALMOLOGY EXAM  09/23/2018  . FOOT EXAM  10/01/2018   Pneumococcal Immunization Status  Topic Date Due  . PNEUMOCOCCAL POLYSACCHARIDE VACCINE  Completed   Past Medical History:  Diagnosis Date  . Blind right eye   . Diabetes mellitus    Takes Metformin  . Hypertension   . Priapism   . Stroke ([place])   . Tobacco use   . Vision abnormalities     Social History   Socioeconomic History  . Marital status: Single    Spouse name: Not on file  . Number of children: Not on file  . Years of education: Not on file  . Highest education level: Not on file  Occupational History  . Not on file  Social Needs  . Financial resource strain: Not on file  . Food insecurity:    Worry: Not on file    Inability: Not on file  . Transportation needs:    Medical: Not on file    Non-medical: Not on file  Tobacco Use  . Smoking status: Current Some Day Smoker    Packs/day: 0.25    Years: 8.00    Pack years: 2.00    Types: Cigarettes  . Smokeless tobacco: Never Used  Substance and Sexual Activity  . Alcohol use:  No  . Drug use: No    Comment: 3x wk  . Sexual activity: Not on file  Lifestyle  . Physical activity:    Days per week: Not on file    Minutes per session: Not on file  . Stress: Not on file  Relationships  . Social connections:    Talks on phone: Not on file    Gets together: Not on file    Attends religious service: Not on file    Active member of club or organization: Not on file    Attends meetings of clubs or organizations: Not on file    Relationship status: Not on file  . Intimate partner violence:    Fear of current or ex partner: Not on file    Emotionally abused: Not on file    Physically abused: Not on file    Forced sexual activity: Not on file  Other Topics Concern  . Not on file  Social History Narrative  . Not on file    No Known Allergies   Review of Systems  Constitutional: Positive for malaise/fatigue.  Respiratory: Negative.   Cardiovascular: Negative.   Neurological: Positive for  dizziness and weakness.  Psychiatric/Behavioral: Negative.      Objective    BP (!) 146/78 (BP Location: Right Arm, Patient Position: Sitting, Cuff Size: Normal)   Pulse 74   Temp 98.2 F (36.8 C) (Oral)   Resp 16   Ht 5' 10.5" (1.791 m)   Wt 198 lb (89.8 kg)   SpO2 100%   BMI 28.01 kg/m    Physical Exam  Constitutional: He is oriented to person, place, and time and well-developed, well-nourished, and in no distress. No distress.  HENT:  Head: Normocephalic and atraumatic.  Eyes: Pupils are equal, round, and reactive to light. Conjunctivae and EOM are normal.  Blindness in the right eye. Patient is squinting throughout the encounter, but denies vision changes or headache.   Neck: Normal range of motion. Neck supple.  Cardiovascular: Normal rate and regular rhythm.  Murmur heard. Pulmonary/Chest: Effort normal and breath sounds normal. No respiratory distress. He has no wheezes.  Abdominal: Soft. Bowel sounds are normal. He exhibits no distension.   Musculoskeletal: Normal range of motion.  Neurological: He is alert and oriented to person, place, and time.  Psychiatric: Mood, memory, affect and judgment normal.     1. Type 2 diabetes mellitus with other circulatory complication, with long-term current use of insulin (HCC)  - Glucose (CBG), Fasting - HgB A1c - CBC With Differential - Comprehensive metabolic panel - TSH - Lipid Panel - Iron  2. Fatigue, unspecified type  - Vitamin B12 - Vitamin D, 25-hydroxy - TSH - Iron  3. Vitamin deficiency  - Vitamin B12 - Vitamin D, 25-hydroxy

## 2017-11-19 NOTE — Patient Instructions (Signed)
Diabetes Mellitus and Sick Day Management Blood sugar (glucose) can be difficult to control when you are sick. Common illnesses that can cause problems for people with diabetes (diabetes mellitus) include colds, fever, flu (influenza), nausea, vomiting, and diarrhea. These illnesses can cause stress and loss of body fluids (dehydration), and those issues can cause blood glucose levels to increase. Because of this, it is very important to take your insulin and diabetes medicines and eat some form of carbohydrate when you are sick. You should make a plan for days when you are sick (sick day plan) as part of your diabetes management plan. You and your health care provider should make this plan in advance. The following guidelines are intended to help you manage an illness that lasts for about 24 hours or less. Your health care provider may also give you more specific instructions. What do I need to do to manage my blood glucose?  Check your blood glucose every 2-4 hours, or as often as told by your health care provider.  Know your sick day treatment goals. Your target blood glucose levels may be different when you are sick.  If you use insulin, take your usual dose. ? If your blood glucose continues to be too high, you may need to take an additional insulin dose as told by your health care provider.  If you use oral diabetes medicine, you may need to stop taking it if you are not able to eat or drink normally. Ask your health care provider about whether you need to stop taking these medicines while you are sick.  If you use injectable hormone medicines other than insulin to control your diabetes, ask your health care provider about whether you need to stop taking these medicines while you are sick. What else can I do to manage my diabetes when I am sick? Check your ketones  If you have type 1 diabetes, check your urine ketones every 4 hours.  If you have type 2 diabetes, check your urine ketones as  often as told by your health care provider. Drink fluids  Drink enough fluid to keep your urine clear or pale yellow. This is especially important if you have a fever, vomiting, or diarrhea. Those symptoms can lead to dehydration.  Follow any instructions from your health care provider about beverages to avoid. ? Do not drink alcohol, caffeine, or drinks that contain a lot of sugar. Take medicines as directed  Take-over-the-counter and prescription medicines only as told by your health care provider.  Check medicine labels for added sugars. Some medicines may contain sugar or types of sugars that can raise your blood glucose level. What foods can I eat when I am sick? You need to eat some form of carbohydrates when you are sick. You should eat 45-50 grams (45-50 g) of carbohydrates every 3-4 hours until you feel better. All of the food choices below contain about 15 g of carbohydrates. Plan ahead and keep some of these foods around so you have them if you get sick.  4-6 oz (120-177 mL) carbonated beverage that contains sugar, such as regular (not diet) soda. You may be able to drink carbonated beverages more easily if you open the beverage and let it sit at room temperature for a few minutes before drinking.   of a twin frozen ice pop.  4 oz (120 g) regular gelatin.  4 oz (120 mL) fruit juice.  4 oz (120 g) ice cream or frozen yogurt.  2 oz (60  g) sherbet.  8 oz (240 mL) clear broth or soup.  4 oz (120 g) regular custard.  4 oz (120 g) regular pudding.  8 oz (240 g) plain yogurt.  1 slice bread or toast.  6 saltine crackers.  5 vanilla wafers.  Questions to ask your health care provider Consider asking the following questions so you know what to do on days when you are sick:  Should I adjust my diabetes medicines?  How often do I need to check my blood glucose?  What supplies do I need to manage my diabetes at home when I am sick?  What number can I call if I have  questions?  What foods and drinks should I avoid?  Contact a health care provider if:  You develop symptoms of diabetic ketoacidosis, such as: ? Fatigue. ? Weight loss. ? Excessive thirst. ? Light-headedness. ? Fruity or sweet-smelling breath. ? Excessive urination. ? Vision changes. ? Confusion or irritability. ? Nausea. ? Vomiting. ? Rapid breathing. ? Pain in the abdomen. ? Feeling flushed.  You are unable to drink fluids without vomiting.  You have any of the following for more than 6 hours: ? Nausea. ? Vomiting. ? Diarrhea.  Your blood glucose is at or above 240 mg/dL (13.3 mmol/L), even after you take an additional insulin dose.  You have a change in how you think, feel, or act (mental status).  You develop another serious illness.  You have been sick or have had a fever for 2 days or longer and you are not getting better. Get help right away if:  Your blood glucose is lower than 54 mg/dL (3.0 mmol/L).  You have difficulty breathing.  You have moderate or high ketone levels in your urine.  You used emergency glucagon to treat low blood glucose. Summary  Blood sugar (glucose) can be difficult to control when you are sick. Common illnesses that can cause problems for people with diabetes (diabetes mellitus) include colds, fever, flu (influenza), nausea, vomiting, and diarrhea.  Illnesses can cause stress and loss of body fluids (dehydration), and those issues can cause blood glucose levels to increase.  Make a plan for days when you are sick (sick day plan) as part of your diabetes management plan. You and your health care provider should make this plan in advance.  It is very important to take your insulin and diabetes medicines and to eat some form of carbohydrate when you are sick.  Contact your health care provider if have problems managing your blood glucose levels when you are sick, or if you have been sick or had a fever for 2 days or longer and are  not getting better. This information is not intended to replace advice given to you by your health care provider. Make sure you discuss any questions you have with your health care provider. Document Released: 04/16/2003 Document Revised: 01/10/2016 Document Reviewed: 01/10/2016 Elsevier Interactive Patient Education  2018 [employer]. Fatigue Fatigue is feeling tired all of the time, a lack of energy, or a lack of motivation. Occasional or mild fatigue is often a normal response to activity or life in general. However, long-lasting (chronic) or extreme fatigue may indicate an underlying medical condition. Follow these instructions at home: Watch your fatigue for any changes. The following actions may help to lessen any discomfort you are feeling:  Talk to your health care provider about how much sleep you need each night. Try to get the required amount every night.  Take medicines  only as directed by your health care provider.  Eat a healthy and nutritious diet. Ask your health care provider if you need help changing your diet.  Drink enough fluid to keep your urine clear or pale yellow.  Practice ways of relaxing, such as yoga, meditation, massage therapy, or acupuncture.  Exercise regularly.  Change situations that cause you stress. Try to keep your work and personal routine reasonable.  Do not abuse illegal drugs.  Limit alcohol intake to no more than 1 drink per day for nonpregnant women and 2 drinks per day for men. One drink equals 12 ounces of beer, 5 ounces of wine, or 1 ounces of hard liquor.  Take a multivitamin, if directed by your health care provider.  Contact a health care provider if:  Your fatigue does not get better.  You have a fever.  You have unintentional weight loss or gain.  You have headaches.  You have difficulty: ? Falling asleep. ? Sleeping throughout the night.  You feel angry, guilty, anxious, or sad.  You are unable to have a bowel  movement (constipation).  You skin is dry.  Your legs or another part of your body is swollen. Get help right away if:  You feel confused.  Your vision is blurry.  You feel faint or pass out.  You have a severe headache.  You have severe abdominal, pelvic, or back pain.  You have chest pain, shortness of breath, or an irregular or fast heartbeat.  You are unable to urinate or you urinate less than normal.  You develop abnormal bleeding, such as bleeding from the rectum, vagina, nose, lungs, or nipples.  You vomit blood.  You have thoughts about harming yourself or committing suicide.  You are worried that you might harm someone else. This information is not intended to replace advice given to you by your health care provider. Make sure you discuss any questions you have with your health care provider. Document Released: 02/08/2007 Document Revised: 09/19/2015 Document Reviewed: 08/15/2013 Elsevier Interactive Patient Education  [employer].

## 2017-11-20 LAB — CBC WITH DIFFERENTIAL
Basophils Absolute: 0 10*3/uL (ref 0.0–0.2)
Basos: 0 %
EOS (ABSOLUTE): 0.2 10*3/uL (ref 0.0–0.4)
Eos: 3 %
Hematocrit: 41 % (ref 37.5–51.0)
Hemoglobin: 13.1 g/dL (ref 13.0–17.7)
Immature Grans (Abs): 0 10*3/uL (ref 0.0–0.1)
Immature Granulocytes: 0 %
Lymphocytes Absolute: 3 10*3/uL (ref 0.7–3.1)
Lymphs: 34 %
MCH: 20.6 pg — ABNORMAL LOW (ref 26.6–33.0)
MCHC: 32 g/dL (ref 31.5–35.7)
MCV: 65 fL — ABNORMAL LOW (ref 79–97)
Monocytes Absolute: 0.6 10*3/uL (ref 0.1–0.9)
Monocytes: 7 %
Neutrophils Absolute: 5 10*3/uL (ref 1.4–7.0)
Neutrophils: 56 %
RBC: 6.35 x10E6/uL — ABNORMAL HIGH (ref 4.14–5.80)
RDW: 17.6 % — ABNORMAL HIGH (ref 12.3–15.4)
WBC: 8.8 10*3/uL (ref 3.4–10.8)

## 2017-11-20 LAB — TSH: TSH: 1.4 u[IU]/mL (ref 0.450–4.500)

## 2017-11-20 LAB — COMPREHENSIVE METABOLIC PANEL
ALT: 6 IU/L (ref 0–44)
AST: 8 IU/L (ref 0–40)
Albumin/Globulin Ratio: 2.3 — ABNORMAL HIGH (ref 1.2–2.2)
Albumin: 4.5 g/dL (ref 3.5–5.5)
Alkaline Phosphatase: 62 IU/L (ref 39–117)
BUN/Creatinine Ratio: 9 (ref 9–20)
BUN: 13 mg/dL (ref 6–24)
Bilirubin Total: 0.3 mg/dL (ref 0.0–1.2)
CO2: 23 mmol/L (ref 20–29)
Calcium: 9.5 mg/dL (ref 8.7–10.2)
Chloride: 101 mmol/L (ref 96–106)
Creatinine, Ser: 1.37 mg/dL — ABNORMAL HIGH (ref 0.76–1.27)
GFR calc Af Amer: 74 mL/min/{1.73_m2} (ref >59)
GFR calc non Af Amer: 64 mL/min/{1.73_m2} (ref >59)
Globulin, Total: 2 g/dL (ref 1.5–4.5)
Glucose: 186 mg/dL — ABNORMAL HIGH (ref 65–99)
Potassium: 4.9 mmol/L (ref 3.5–5.2)
Sodium: 141 mmol/L (ref 134–144)
Total Protein: 6.5 g/dL (ref 6.0–8.5)

## 2017-11-20 LAB — VITAMIN D 25 HYDROXY (VIT D DEFICIENCY, FRACTURES): Vit D, 25-Hydroxy: 9.6 ng/mL — ABNORMAL LOW (ref 30.0–100.0)

## 2017-11-20 LAB — LIPID PANEL
Chol/HDL Ratio: 4.3 ratio (ref 0.0–5.0)
Cholesterol, Total: 120 mg/dL (ref 100–199)
HDL: 28 mg/dL — ABNORMAL LOW (ref >39)
LDL Calculated: 59 mg/dL (ref 0–99)
Triglycerides: 163 mg/dL — ABNORMAL HIGH (ref 0–149)
VLDL Cholesterol Cal: 33 mg/dL (ref 5–40)

## 2017-11-20 LAB — VITAMIN B12: Vitamin B-12: 515 pg/mL (ref 232–1245)

## 2017-11-20 LAB — IRON: Iron: 58 ug/dL (ref 38–169)

## 2017-11-23 ENCOUNTER — Telehealth: Payer: Self-pay | Admitting: Family Medicine

## 2017-11-23 NOTE — Telephone Encounter (Signed)
Frank Schneider, What do we need to advise patient on lab results. Please let me know. Thanks!

## 2017-11-23 NOTE — Telephone Encounter (Signed)
Pt called for lab results pt states he usually has those back the next day. Pt also wants update on referral to program to quit smoking.

## 2017-11-28 MED ORDER — VITAMIN D (ERGOCALCIFEROL) 1.25 MG (50000 UNIT) PO CAPS: 50000.0000 [IU] | ORAL_CAPSULE | ORAL | 0 refills | Status: AC

## 2017-11-28 NOTE — Addendum Note (Signed)
Addended by: Genelle Bal on: 11/28/2017 11:30 PM   Modules accepted: Orders

## 2017-11-28 NOTE — Telephone Encounter (Signed)
Okay to put referral to smoking cessation in for patient and send to me for cosign.

## 2017-11-28 NOTE — Progress Notes (Signed)
Labs were stable except vitamin D is low. Will need supplement.   I sent this to the pharmacy on file.

## 2017-11-29 MED FILL — VIT D2 1.25 MG (50,000 UNIT: 1.25 MG | 28 days supply | Qty: 4 | Fill #0

## 2017-11-29 NOTE — Telephone Encounter (Signed)
-----   Message from Lanae Boast, [place] sent at 11/28/2017 11:30 PM EDT ----- Labs were stable except vitamin D is low. Will need supplement.   I sent this to the pharmacy on file.

## 2017-11-29 NOTE — Telephone Encounter (Signed)
Called patient, no answer. Left a voicemail that all labs were stable except for vitamin D. Advised that we have prescribed vitamin D supplement and that he should take this once weekly as prescribed. Advised that he has been referred to [place] quit now and they should be contacting him by phone within approximately a week. Asked if any questions to call back to our office. Thanks!

## 2017-12-08 ENCOUNTER — Ambulatory Visit: Payer: Medicaid Other | Admitting: Podiatry

## 2017-12-30 DIAGNOSIS — H27111 Subluxation of lens, right eye: Secondary | ICD-10-CM | POA: Diagnosis not present

## 2017-12-30 DIAGNOSIS — H209 Unspecified iridocyclitis: Secondary | ICD-10-CM | POA: Diagnosis not present

## 2017-12-31 ENCOUNTER — Ambulatory Visit: Payer: Medicaid Other | Admitting: Family Medicine

## 2018-01-10 ENCOUNTER — Ambulatory Visit: Payer: Medicaid Other | Admitting: Family Medicine

## 2018-01-13 DIAGNOSIS — H16223 Keratoconjunctivitis sicca, not specified as Sjogren's, bilateral: Secondary | ICD-10-CM | POA: Diagnosis not present

## 2018-01-13 DIAGNOSIS — H27111 Subluxation of lens, right eye: Secondary | ICD-10-CM | POA: Diagnosis not present

## 2018-01-13 DIAGNOSIS — H04121 Dry eye syndrome of right lacrimal gland: Secondary | ICD-10-CM | POA: Diagnosis not present

## 2018-01-13 DIAGNOSIS — H209 Unspecified iridocyclitis: Secondary | ICD-10-CM | POA: Diagnosis not present

## 2018-01-13 MED FILL — RESTASIS 0.05% EYE EMULSION: 0.05 | 29 days supply | Qty: 1 | Fill #0

## 2018-01-24 MED FILL — LISINOPRIL 20 MG TAB: 20 | 30 days supply | Qty: 30 | Fill #2

## 2018-01-24 MED FILL — ACCU-CHEK AVIVA PLUS TEST S: 30 days supply | Qty: 100 | Fill #1

## 2018-01-24 MED FILL — LANTUS 100 UNITS/ML VIAL: 100 | 28 days supply | Qty: 10 | Fill #3

## 2018-02-28 DIAGNOSIS — H04121 Dry eye syndrome of right lacrimal gland: Secondary | ICD-10-CM | POA: Diagnosis not present

## 2018-02-28 DIAGNOSIS — H16223 Keratoconjunctivitis sicca, not specified as Sjogren's, bilateral: Secondary | ICD-10-CM | POA: Diagnosis not present

## 2018-02-28 DIAGNOSIS — H209 Unspecified iridocyclitis: Secondary | ICD-10-CM | POA: Diagnosis not present

## 2018-02-28 DIAGNOSIS — H27111 Subluxation of lens, right eye: Secondary | ICD-10-CM | POA: Diagnosis not present

## 2018-02-28 MED FILL — BRIMONIDINE 0.2% EYE DROP: 0.2 | 90 days supply | Qty: 10 | Fill #0

## 2018-02-28 MED FILL — CYCLOPENTOLATE HCL 2% DROPS: 2 | 30 days supply | Qty: 5 | Fill #0

## 2018-02-28 MED FILL — NEO/POLY/DEXAMET EYE OINT: 3.5-10000-0 | 30 days supply | Qty: 4 | Fill #0

## 2018-04-25 DIAGNOSIS — H16223 Keratoconjunctivitis sicca, not specified as Sjogren's, bilateral: Secondary | ICD-10-CM | POA: Diagnosis not present

## 2018-04-25 DIAGNOSIS — H40033 Anatomical narrow angle, bilateral: Secondary | ICD-10-CM | POA: Diagnosis not present

## 2018-04-29 ENCOUNTER — Inpatient Hospital Stay (HOSPITAL_COMMUNITY)
Admission: EM | Admit: 2018-04-29 | Discharge: 2018-05-01 | DRG: 066 | Disposition: A | Discharge status: Home / Self Care | Payer: Medicaid Other | Attending: Internal Medicine | Admitting: Internal Medicine

## 2018-04-29 ENCOUNTER — Other Ambulatory Visit: Payer: Self-pay

## 2018-04-29 ENCOUNTER — Emergency Department (HOSPITAL_COMMUNITY): Payer: Medicaid Other

## 2018-04-29 ENCOUNTER — Encounter (HOSPITAL_COMMUNITY): Payer: Self-pay

## 2018-04-29 ENCOUNTER — Observation Stay (HOSPITAL_COMMUNITY): Payer: Medicaid Other

## 2018-04-29 DIAGNOSIS — I10 Essential (primary) hypertension: Secondary | ICD-10-CM | POA: Diagnosis present

## 2018-04-29 DIAGNOSIS — Z8673 Personal history of transient ischemic attack (TIA), and cerebral infarction without residual deficits: Secondary | ICD-10-CM

## 2018-04-29 DIAGNOSIS — E118 Type 2 diabetes mellitus with unspecified complications: Secondary | ICD-10-CM | POA: Diagnosis not present

## 2018-04-29 DIAGNOSIS — Z794 Long term (current) use of insulin: Secondary | ICD-10-CM

## 2018-04-29 DIAGNOSIS — Z833 Family history of diabetes mellitus: Secondary | ICD-10-CM

## 2018-04-29 DIAGNOSIS — I6930 Unspecified sequelae of cerebral infarction: Secondary | ICD-10-CM

## 2018-04-29 DIAGNOSIS — Z8249 Family history of ischemic heart disease and other diseases of the circulatory system: Secondary | ICD-10-CM

## 2018-04-29 DIAGNOSIS — S0990XA Unspecified injury of head, initial encounter: Secondary | ICD-10-CM | POA: Diagnosis not present

## 2018-04-29 DIAGNOSIS — R4781 Slurred speech: Secondary | ICD-10-CM

## 2018-04-29 DIAGNOSIS — R4701 Aphasia: Secondary | ICD-10-CM | POA: Diagnosis present

## 2018-04-29 DIAGNOSIS — I639 Cerebral infarction, unspecified: Secondary | ICD-10-CM | POA: Diagnosis not present

## 2018-04-29 DIAGNOSIS — Z9841 Cataract extraction status, right eye: Secondary | ICD-10-CM

## 2018-04-29 DIAGNOSIS — E785 Hyperlipidemia, unspecified: Secondary | ICD-10-CM | POA: Diagnosis present

## 2018-04-29 DIAGNOSIS — H5213 Myopia, bilateral: Secondary | ICD-10-CM | POA: Diagnosis not present

## 2018-04-29 DIAGNOSIS — E1165 Type 2 diabetes mellitus with hyperglycemia: Secondary | ICD-10-CM | POA: Diagnosis not present

## 2018-04-29 DIAGNOSIS — IMO0002 Reserved for concepts with insufficient information to code with codable children: Secondary | ICD-10-CM | POA: Diagnosis present

## 2018-04-29 DIAGNOSIS — H5461 Unqualified visual loss, right eye, normal vision left eye: Secondary | ICD-10-CM | POA: Diagnosis present

## 2018-04-29 DIAGNOSIS — D509 Iron deficiency anemia, unspecified: Secondary | ICD-10-CM | POA: Diagnosis present

## 2018-04-29 DIAGNOSIS — Z79899 Other long term (current) drug therapy: Secondary | ICD-10-CM

## 2018-04-29 DIAGNOSIS — H5440 Blindness, one eye, unspecified eye: Secondary | ICD-10-CM | POA: Diagnosis present

## 2018-04-29 DIAGNOSIS — R1310 Dysphagia, unspecified: Secondary | ICD-10-CM | POA: Diagnosis present

## 2018-04-29 DIAGNOSIS — E876 Hypokalemia: Secondary | ICD-10-CM | POA: Diagnosis present

## 2018-04-29 DIAGNOSIS — Z7902 Long term (current) use of antithrombotics/antiplatelets: Secondary | ICD-10-CM

## 2018-04-29 DIAGNOSIS — F17210 Nicotine dependence, cigarettes, uncomplicated: Secondary | ICD-10-CM | POA: Diagnosis present

## 2018-04-29 HISTORY — DX: Unspecified sequelae of cerebral infarction: I69.30

## 2018-04-29 LAB — COMPREHENSIVE METABOLIC PANEL
ALBUMIN: 4.5 g/dL (ref 3.5–5.0)
ALK PHOS: 59 U/L (ref 38–126)
ALT: 18 U/L (ref 0–44)
AST: 25 U/L (ref 15–41)
Anion gap: 11 (ref 5–15)
BILIRUBIN TOTAL: 1 mg/dL (ref 0.3–1.2)
BUN: 10 mg/dL (ref 6–20)
CALCIUM: 8.8 mg/dL — AB (ref 8.9–10.3)
CO2: 26 mmol/L (ref 22–32)
Chloride: 100 mmol/L (ref 98–111)
Creatinine, Ser: 0.94 mg/dL (ref 0.61–1.24)
GFR calc Af Amer: >60 mL/min (ref >60)
GLUCOSE: 299 mg/dL — AB (ref 70–99)
POTASSIUM: 4.2 mmol/L (ref 3.5–5.1)
Sodium: 137 mmol/L (ref 135–145)
TOTAL PROTEIN: 7.4 g/dL (ref 6.5–8.1)

## 2018-04-29 LAB — I-STAT CHEM 8, ED
BUN: 9 mg/dL (ref 6–20)
CHLORIDE: 101 mmol/L (ref 98–111)
CREATININE: 0.9 mg/dL (ref 0.61–1.24)
Calcium, Ion: 1.15 mmol/L (ref 1.15–1.40)
Glucose, Bld: 294 mg/dL — ABNORMAL HIGH (ref 70–99)
HEMATOCRIT: 44 % (ref 39.0–52.0)
Hemoglobin: 15 g/dL (ref 13.0–17.0)
POTASSIUM: 4.2 mmol/L (ref 3.5–5.1)
SODIUM: 137 mmol/L (ref 135–145)
TCO2: 28 mmol/L (ref 22–32)

## 2018-04-29 LAB — DIFFERENTIAL
BASOS PCT: 0 %
Basophils Absolute: 0 10*3/uL (ref 0.0–0.1)
EOS ABS: 0.2 10*3/uL (ref 0.0–0.5)
Eosinophils Relative: 2 %
Lymphocytes Relative: 20 %
Lymphs Abs: 1.6 10*3/uL (ref 0.7–4.0)
Monocytes Absolute: 0.5 10*3/uL (ref 0.1–1.0)
Monocytes Relative: 6 %
Neutro Abs: 6 10*3/uL (ref 1.7–7.7)
Neutrophils Relative %: 72 %

## 2018-04-29 LAB — CBG MONITORING, ED
GLUCOSE-CAPILLARY: 300 mg/dL — AB (ref 70–99)
GLUCOSE-CAPILLARY: 171 mg/dL — AB (ref 70–99)

## 2018-04-29 LAB — CBC
HCT: 42.4 % (ref 39.0–52.0)
HEMOGLOBIN: 12.7 g/dL — AB (ref 13.0–17.0)
MCH: 19.6 pg — AB (ref 26.0–34.0)
MCHC: 30 g/dL (ref 30.0–36.0)
MCV: 65.3 fL — ABNORMAL LOW (ref 80.0–100.0)
PLATELETS: 177 10*3/uL (ref 150–400)
RBC: 6.49 MIL/uL — ABNORMAL HIGH (ref 4.22–5.81)
RDW: 17.5 % — AB (ref 11.5–15.5)
WBC: 8.4 10*3/uL (ref 4.0–10.5)
nRBC: 0 % (ref 0.0–0.2)

## 2018-04-29 LAB — PROTIME-INR
INR: 0.97
Prothrombin Time: 12.8 seconds (ref 11.4–15.2)

## 2018-04-29 LAB — APTT: aPTT: 25 seconds (ref 24–36)

## 2018-04-29 LAB — I-STAT TROPONIN, ED: Troponin i, poc: 0.01 ng/mL (ref 0.00–0.08)

## 2018-04-29 MED ORDER — ACETAMINOPHEN 325 MG PO TABS: 650.0000 mg | ORAL_TABLET | ORAL | Status: DC | PRN

## 2018-04-29 MED ORDER — CLOPIDOGREL BISULFATE 75 MG PO TABS
75.0000 mg | ORAL_TABLET | Freq: Every day | ORAL | Status: DC
  Administered 2018-04-30 – 2018-05-01 (×2): 75 mg via ORAL
  Filled 2018-04-29 (×2): qty 1

## 2018-04-29 MED ORDER — ACETAMINOPHEN 650 MG RE SUPP: 650.0000 mg | RECTAL | Status: DC | PRN

## 2018-04-29 MED ORDER — NEOMYCIN-POLYMYXIN-DEXAMETH 3.5-10000-0.1 OP SUSP
1.0000 [drp] | Freq: Every day | OPHTHALMIC | Status: DC
  Administered 2018-04-30 (×2): 1 [drp] via OPHTHALMIC
  Filled 2018-04-29: qty 5

## 2018-04-29 MED ORDER — ASPIRIN 325 MG PO TABS
325.0000 mg | ORAL_TABLET | Freq: Every day | ORAL | Status: DC
  Administered 2018-04-29: 325 mg via ORAL
  Filled 2018-04-29: qty 1

## 2018-04-29 MED ORDER — INSULIN ASPART 100 UNIT/ML ~~LOC~~ SOLN
0.0000 [IU] | Freq: Three times a day (TID) | SUBCUTANEOUS | Status: DC
  Administered 2018-04-30: 1 [IU] via SUBCUTANEOUS

## 2018-04-29 MED ORDER — BRIMONIDINE TARTRATE 0.2 % OP SOLN
1.0000 [drp] | Freq: Two times a day (BID) | OPHTHALMIC | Status: DC
  Administered 2018-04-30 – 2018-05-01 (×4): 1 [drp] via OPHTHALMIC
  Filled 2018-04-29: qty 5

## 2018-04-29 MED ORDER — INSULIN ASPART 100 UNIT/ML ~~LOC~~ SOLN: 0.0000 [IU] | Freq: Every day | SUBCUTANEOUS | Status: DC

## 2018-04-29 MED ORDER — LABETALOL HCL 5 MG/ML IV SOLN: 5.0000 mg | INTRAVENOUS | Status: DC | PRN

## 2018-04-29 MED ORDER — ASPIRIN 325 MG PO TABS
325.0000 mg | ORAL_TABLET | Freq: Every day | ORAL | Status: DC
  Administered 2018-04-30 – 2018-05-01 (×2): 325 mg via ORAL
  Filled 2018-04-29 (×2): qty 1

## 2018-04-29 MED ORDER — CYCLOPENTOLATE HCL 2 % OP SOLN
1.0000 [drp] | Freq: Every day | OPHTHALMIC | Status: DC
  Filled 2018-04-29: qty 2

## 2018-04-29 MED ORDER — SODIUM CHLORIDE 0.9 % IV SOLN
INTRAVENOUS | Status: AC
  Administered 2018-04-29 – 2018-04-30 (×2): via INTRAVENOUS

## 2018-04-29 MED ORDER — STROKE: EARLY STAGES OF RECOVERY BOOK
Freq: Once | Status: AC
  Administered 2018-04-29: 22:00:00
  Filled 2018-04-29: qty 1

## 2018-04-29 MED ORDER — ASPIRIN 300 MG RE SUPP: 300.0000 mg | Freq: Every day | RECTAL | Status: DC

## 2018-04-29 MED ORDER — SENNOSIDES-DOCUSATE SODIUM 8.6-50 MG PO TABS: 1.0000 | ORAL_TABLET | Freq: Every evening | ORAL | Status: DC | PRN

## 2018-04-29 MED ORDER — ATROPINE SULFATE 1 % OP OINT
1.0000 "application " | TOPICAL_OINTMENT | Freq: Every day | OPHTHALMIC | Status: DC
  Filled 2018-04-29 (×2): qty 3.5

## 2018-04-29 MED ORDER — ACETAMINOPHEN 160 MG/5ML PO SOLN: 650.0000 mg | ORAL | Status: DC | PRN

## 2018-04-29 MED ORDER — INSULIN GLARGINE 100 UNIT/ML ~~LOC~~ SOLN
15.0000 [IU] | Freq: Every day | SUBCUTANEOUS | Status: DC
  Administered 2018-04-29 – 2018-04-30 (×2): 15 [IU] via SUBCUTANEOUS
  Filled 2018-04-29 (×3): qty 0.15

## 2018-04-29 NOTE — ED Notes (Signed)
Patient transported back from MRI. 
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
## 2018-04-29 NOTE — ED Notes (Signed)
Pt transported to MRI 
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
## 2018-04-29 NOTE — ED Notes (Signed)
Patient transported to MRI 
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
## 2018-04-30 ENCOUNTER — Observation Stay (HOSPITAL_COMMUNITY): Payer: Medicaid Other

## 2018-04-30 DIAGNOSIS — I639 Cerebral infarction, unspecified: Secondary | ICD-10-CM

## 2018-04-30 DIAGNOSIS — D509 Iron deficiency anemia, unspecified: Secondary | ICD-10-CM | POA: Diagnosis present

## 2018-04-30 DIAGNOSIS — R1310 Dysphagia, unspecified: Secondary | ICD-10-CM

## 2018-04-30 DIAGNOSIS — I10 Essential (primary) hypertension: Secondary | ICD-10-CM | POA: Diagnosis not present

## 2018-04-30 DIAGNOSIS — Z794 Long term (current) use of insulin: Secondary | ICD-10-CM | POA: Diagnosis not present

## 2018-04-30 DIAGNOSIS — Z8249 Family history of ischemic heart disease and other diseases of the circulatory system: Secondary | ICD-10-CM | POA: Diagnosis not present

## 2018-04-30 DIAGNOSIS — Z79899 Other long term (current) drug therapy: Secondary | ICD-10-CM | POA: Diagnosis not present

## 2018-04-30 DIAGNOSIS — S0990XA Unspecified injury of head, initial encounter: Secondary | ICD-10-CM | POA: Diagnosis not present

## 2018-04-30 DIAGNOSIS — E785 Hyperlipidemia, unspecified: Secondary | ICD-10-CM | POA: Diagnosis present

## 2018-04-30 DIAGNOSIS — R4781 Slurred speech: Secondary | ICD-10-CM | POA: Diagnosis not present

## 2018-04-30 DIAGNOSIS — E1165 Type 2 diabetes mellitus with hyperglycemia: Secondary | ICD-10-CM | POA: Diagnosis not present

## 2018-04-30 DIAGNOSIS — Z9841 Cataract extraction status, right eye: Secondary | ICD-10-CM | POA: Diagnosis not present

## 2018-04-30 DIAGNOSIS — R4701 Aphasia: Secondary | ICD-10-CM | POA: Diagnosis present

## 2018-04-30 DIAGNOSIS — E118 Type 2 diabetes mellitus with unspecified complications: Secondary | ICD-10-CM

## 2018-04-30 DIAGNOSIS — E876 Hypokalemia: Secondary | ICD-10-CM | POA: Diagnosis present

## 2018-04-30 DIAGNOSIS — H5461 Unqualified visual loss, right eye, normal vision left eye: Secondary | ICD-10-CM | POA: Diagnosis present

## 2018-04-30 DIAGNOSIS — Z833 Family history of diabetes mellitus: Secondary | ICD-10-CM | POA: Diagnosis not present

## 2018-04-30 DIAGNOSIS — F17210 Nicotine dependence, cigarettes, uncomplicated: Secondary | ICD-10-CM | POA: Diagnosis present

## 2018-04-30 DIAGNOSIS — Z7902 Long term (current) use of antithrombotics/antiplatelets: Secondary | ICD-10-CM | POA: Diagnosis not present

## 2018-04-30 DIAGNOSIS — Z8673 Personal history of transient ischemic attack (TIA), and cerebral infarction without residual deficits: Secondary | ICD-10-CM | POA: Diagnosis not present

## 2018-04-30 DIAGNOSIS — H5440 Blindness, one eye, unspecified eye: Secondary | ICD-10-CM | POA: Diagnosis not present

## 2018-04-30 LAB — HEMOGLOBIN A1C
Hgb A1c MFr Bld: 7.3 % — ABNORMAL HIGH (ref 4.8–5.6)
Mean Plasma Glucose: 162.81 mg/dL

## 2018-04-30 LAB — LIPID PANEL
Cholesterol: 147 mg/dL (ref 0–200)
HDL: 24 mg/dL — ABNORMAL LOW (ref >40)
LDL Cholesterol: 88 mg/dL (ref 0–99)
Total CHOL/HDL Ratio: 6.1 RATIO
Triglycerides: 173 mg/dL — ABNORMAL HIGH (ref <150)
VLDL: 35 mg/dL (ref 0–40)

## 2018-04-30 LAB — ECHOCARDIOGRAM COMPLETE
Height: 70.5 in
WEIGHTICAEL: 3037.06 [oz_av]

## 2018-04-30 LAB — GLUCOSE, CAPILLARY
GLUCOSE-CAPILLARY: 138 mg/dL — AB (ref 70–99)
Glucose-Capillary: 77 mg/dL (ref 70–99)
Glucose-Capillary: 141 mg/dL — ABNORMAL HIGH (ref 70–99)
Glucose-Capillary: 72 mg/dL (ref 70–99)

## 2018-04-30 MED ORDER — CYCLOPENTOLATE HCL 1 % OP SOLN
2.0000 [drp] | Freq: Every day | OPHTHALMIC | Status: DC
  Administered 2018-04-30 – 2018-05-01 (×2): 2 [drp] via OPHTHALMIC
  Filled 2018-04-30: qty 2

## 2018-04-30 MED ORDER — SODIUM CHLORIDE 0.9 % IV SOLN: INTRAVENOUS | Status: DC | PRN

## 2018-04-30 MED ORDER — INSULIN ASPART 100 UNIT/ML ~~LOC~~ SOLN
0.0000 [IU] | SUBCUTANEOUS | Status: DC
  Administered 2018-04-30 – 2018-05-01 (×2): 2 [IU] via SUBCUTANEOUS
  Administered 2018-05-01: 3 [IU] via SUBCUTANEOUS

## 2018-04-30 MED ORDER — DEXTROSE-NACL 5-0.9 % IV SOLN
INTRAVENOUS | Status: DC
  Administered 2018-04-30: 23:00:00 via INTRAVENOUS

## 2018-04-30 MED ORDER — SODIUM CHLORIDE 0.9 % IV SOLN
INTRAVENOUS | Status: AC
  Administered 2018-04-30 (×2): via INTRAVENOUS

## 2018-04-30 NOTE — Evaluation (Signed)
Occupational Therapy Evaluation Patient Details Name: Frank Schneider MRN: [id number] DOB: 08/14/1975 Today's Date: 04/30/2018    History of Present Illness Pt is a 43 y/o male admitted secondary to speech difficulties and gait instability. MRI revealed a 1cm acute infarct at the junction of the anterolateral thalamus on the L and the posterior limb of the internal capsule. PMH including but not limited to DM, HTN, CVA and blind in R eye.   Clinical Impression   Patient evaluated by Occupational Therapy with no further acute OT needs identified. All education has been completed and the patient has no further questions. See below for any follow-up Occupational Therapy or equipment needs. OT to sign off. Thank you for referral.      Follow Up Recommendations  No OT follow up    Equipment Recommendations  None recommended by OT    Recommendations for Other Services Speech consult     Precautions / Restrictions Precautions Precautions: Fall Restrictions Weight Bearing Restrictions: No      Mobility Bed Mobility Overal bed mobility: Needs Assistance Bed Mobility: Supine to Sit;Sit to Supine     Supine to sit: Supervision Sit to supine: Supervision   General bed mobility comments: for safety  Transfers Overall transfer level: Needs assistance Equipment used: None Transfers: Sit to/from Stand Sit to Stand: Supervision         General transfer comment: for safety    Balance Overall balance assessment: Needs assistance Sitting-balance support: Feet supported Sitting balance-Leahy Scale: Good     Standing balance support: No upper extremity supported Standing balance-Leahy Scale: Good               High level balance activites: Backward walking;Direction changes;Turns;Sudden stops;Head turns High Level Balance Comments: WFL for all attempts Standardized Balance Assessment Standardized Balance Assessment : Dynamic Gait Index   Dynamic Gait Index Level  Surface: Normal Change in Gait Speed: Normal Gait with Horizontal Head Turns: Normal Gait with Vertical Head Turns: Normal Gait and Pivot Turn: Normal Step Over Obstacle: Normal Step Around Obstacles: Normal Steps: Mild Impairment Total Score: 23     ADL either performed or assessed with clinical judgement   ADL Overall ADL's : Needs assistance/impaired   Eating/Feeding Details (indicate cue type and reason): pt self reports coughing with meal. pt reports it is harder to swallow than normal. RN notified. pt noted to have decr swallowing with extra salvia in mouth. pt needed to clear throat due to secretions Grooming: Set up   Upper Body Bathing: Set up   Lower Body Bathing: Set up   Upper Body Dressing : Set up   Lower Body Dressing: Set up   Toilet Transfer: Min guard       Tub/ Shower Transfer: Min guard   Functional mobility during ADLs: Min guard General ADL Comments: pt with decr gait velocity but appears this could be close to baseline speed. pt reports deficits are speech and swallowing     Vision Baseline Vision/History: Legally blind Additional Comments: WFL for oculomotor for L eye. pt baseline deficits in R eye     Perception     Praxis      Pertinent Vitals/Pain Pain Assessment: No/denies pain     Hand Dominance Right   Extremity/Trunk Assessment Upper Extremity Assessment Upper Extremity Assessment: Overall WFL for tasks assessed   Lower Extremity Assessment Lower Extremity Assessment: Defer to PT evaluation   Cervical / Trunk Assessment Cervical / Trunk Assessment: Normal   Communication Communication Communication: Expressive difficulties  Cognition Arousal/Alertness: Awake/alert Behavior During Therapy: WFL for tasks assessed/performed Overall Cognitive Status: Impaired/Different from baseline Area of Impairment: Problem solving                             Problem Solving: Slow processing;Requires verbal cues      General Comments       Exercises     Shoulder Instructions      Home Living Family/patient expects to be discharged to:: Private residence Living Arrangements: Non-relatives/Friends Available Help at Discharge: Family;Friend(s);Available 24 hours/day Type of Home: Apartment Home Access: Stairs to enter Entrance Stairs-Number of Steps: 2 Entrance Stairs-Rails: None Home Layout: One level     Bathroom Shower/Tub: Teacher, early years/pre: Standard     Home Equipment: None   Additional Comments: has roommate mother brother and girlfriend that can (A) at d/c      Prior Functioning/Environment Level of Independence: Independent        Comments: 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         OT Problem List:        OT Treatment/Interventions:      OT Goals(Current goals can be found in the care plan section) Acute Rehab OT Goals Patient Stated Goal: return home  OT Frequency:     Barriers to D/C:            Co-evaluation              AM-PAC OT "6 Clicks" Daily Activity     Outcome Measure Help from another person eating meals?: None Help from another person taking care of personal grooming?: None Help from another person toileting, which includes using toliet, bedpan, or urinal?: None Help from another person bathing (including washing, rinsing, drying)?: None Help from another person to put on and taking off regular upper body clothing?: None Help from another person to put on and taking off regular lower body clothing?: None 6 Click Score: 24   End of Session Equipment Utilized During Treatment: Gait belt Nurse Communication: Mobility status;Precautions  Activity Tolerance: Patient tolerated treatment well Patient left: in bed;with call bell/phone within reach;with bed alarm set;with family/visitor present  OT Visit Diagnosis: Unsteadiness on feet (R26.81)                 Time: 2751-7001 OT Time Calculation (min): 19 min Charges:  OT General Charges $OT Visit: 1 Visit OT Evaluation $OT Eval Moderate Complexity: 1 Mod   Jeri Modena, OTR/L  Acute Rehabilitation Services Pager: [phone] Office: [phone] .   Jeri Modena 04/30/2018, 2:39 PM

## 2018-04-30 NOTE — Evaluation (Signed)
Physical Therapy Evaluation & Discharge Patient Details Name: Frank Schneider MRN: [id number] DOB: 1976/03/06 Today's Date: 04/30/2018   History of Present Illness  Pt is a 43 y/o male admitted secondary to speech difficulties and gait instability. MRI revealed a 1cm acute infarct at the junction of the anterolateral thalamus on the L and the posterior limb of the internal capsule. PMH including but not limited to DM, HTN, CVA and blind in R eye.    Clinical Impression  Pt presented supine in bed with HOB elevated, awake and willing to participate in therapy session. Prior to admission, pt reported that he was independent with all functional mobility and ADLs. Pt lives with a roommate in a first level apartment with a couple of steps to enter. Pt currently at supervision level for all functional mobility. He participated in a higher level balance assessment and scored a 23/24 on the DGI, indicating that he is a safe community ambulator. Pt did report difficulty swallowing and choking, RN was notified. No further acute PT needs identified at this time. PT signing off.     Follow Up Recommendations No PT follow up    Equipment Recommendations  None recommended by PT    Recommendations for Other Services       Precautions / Restrictions Precautions Precautions: Fall Restrictions Weight Bearing Restrictions: No      Mobility  Bed Mobility Overal bed mobility: Needs Assistance Bed Mobility: Supine to Sit;Sit to Supine     Supine to sit: Supervision Sit to supine: Supervision   General bed mobility comments: for safety  Transfers Overall transfer level: Needs assistance Equipment used: None Transfers: Sit to/from Stand Sit to Stand: Supervision         General transfer comment: for safety  Ambulation/Gait Ambulation/Gait assistance: Min guard;Supervision Gait Distance (Feet): 300 Feet Assistive device: None Gait Pattern/deviations: Step-through pattern;Decreased stride  length Gait velocity: able to fluctuate   General Gait Details: pt generally steady overall with ambulation over level, even surfaces with no challenges to balance  Stairs Stairs: Yes Stairs assistance: Supervision Stair Management: No rails;One rail Left;Step to pattern;Alternating pattern;Forwards Number of Stairs: 2 General stair comments: pt able to use reciprocal gait pattern without UE support to ascend, required one hand rail and used a step-to pattern to descend  Wheelchair Mobility    Modified Rankin (Stroke Patients Only) Modified Rankin (Stroke Patients Only) Pre-Morbid Rankin Score: No symptoms Modified Rankin: Slight disability     Balance Overall balance assessment: Needs assistance Sitting-balance support: Feet supported Sitting balance-Leahy Scale: Good     Standing balance support: No upper extremity supported Standing balance-Leahy Scale: Good                   Standardized Balance Assessment Standardized Balance Assessment : Dynamic Gait Index   Dynamic Gait Index Level Surface: Normal Change in Gait Speed: Normal Gait with Horizontal Head Turns: Normal Gait with Vertical Head Turns: Normal Gait and Pivot Turn: Normal Step Over Obstacle: Normal Step Around Obstacles: Normal Steps: Mild Impairment Total Score: 23       Pertinent Vitals/Pain Pain Assessment: No/denies pain    Home Living Family/patient expects to be discharged to:: Private residence Living Arrangements: Non-relatives/Friends Available Help at Discharge: Family;Friend(s);Available 24 hours/day Type of Home: Apartment Home Access: Stairs to enter Entrance Stairs-Rails: None Entrance Stairs-Number of Steps: 2 Home Layout: One level Home Equipment: None      Prior Function Level of Independence: Independent  Hand Dominance        Extremity/Trunk Assessment   Upper Extremity Assessment Upper Extremity Assessment: Defer to OT  evaluation;Overall [facility] for tasks assessed    Lower Extremity Assessment Lower Extremity Assessment: Overall WFL for tasks assessed       Communication   Communication: Expressive difficulties  Cognition Arousal/Alertness: Awake/alert Behavior During Therapy: WFL for tasks assessed/performed Overall Cognitive Status: Impaired/Different from baseline Area of Impairment: Problem solving                             Problem Solving: Slow processing;Requires verbal cues        General Comments      Exercises     Assessment/Plan    PT Assessment Patent does not need any further PT services  PT Problem List         PT Treatment Interventions      PT Goals (Current goals can be found in the Care Plan section)  Acute Rehab PT Goals Patient Stated Goal: return home PT Goal Formulation: All assessment and education complete, DC therapy    Frequency     Barriers to discharge        Co-evaluation               AM-PAC PT "6 Clicks" Mobility  Outcome Measure Help needed turning from your back to your side while in a flat bed without using bedrails?: None Help needed moving from lying on your back to sitting on the side of a flat bed without using bedrails?: None Help needed moving to and from a bed to a chair (including a wheelchair)?: None Help needed standing up from a chair using your arms (e.g., wheelchair or bedside chair)?: None Help needed to walk in hospital room?: None Help needed climbing 3-5 steps with a railing? : None 6 Click Score: 24    End of Session Equipment Utilized During Treatment: Gait belt Activity Tolerance: Patient tolerated treatment well Patient left: in bed;with call bell/phone within reach;with family/visitor present;with nursing/sitter in room Nurse Communication: Mobility status PT Visit Diagnosis: Other symptoms and signs involving the nervous system (R29.898)    Time: 7939-0300 PT Time Calculation (min) (ACUTE  ONLY): 18 min   Charges:   PT Evaluation $PT Eval Moderate Complexity: Cham[place], PT, DPT  Acute Rehabilitation Services Pager [phone] [place] 04/30/2018, 2:00 PM

## 2018-04-30 NOTE — Progress Notes (Signed)
VASCULAR LAB PRELIMINARY  PRELIMINARY  PRELIMINARY  PRELIMINARY  Carotid duplex completed.    Preliminary report:  1-39% ICA plaquing.  Vertebral artery flow is antegrade.   Ivery Nanney, RVT 04/30/2018, 5:13 PM

## 2018-04-30 NOTE — Evaluation (Signed)
Clinical/Bedside Swallow Evaluation Patient Details  Name: Frank Schneider MRN: [id number] Date of Birth: 03-25-1976  Today's Date: 04/30/2018 Time: SLP Start Time (ACUTE ONLY): 1555 SLP Stop Time (ACUTE ONLY): 1608 SLP Time Calculation (min) (ACUTE ONLY): 13 min  Past Medical History:  Past Medical History:  Diagnosis Date  . Blind right eye   . Diabetes mellitus    Takes Metformin  . Hypertension   . Priapism   . Stroke ([place])   . Tobacco use   . Vision abnormalities    Past Surgical History:  Past Surgical History:  Procedure Laterality Date  . APPENDECTOMY    . EYE SURGERY     cataract removed 05/27/2016 right eye   . INCISION AND DRAINAGE PERIRECTAL ABSCESS  07/28/2011   Procedure: IRRIGATION AND DEBRIDEMENT PERIRECTAL ABSCESS;  Surgeon: Adin Hector, MD;  Location: [facility];  Service: General;  Laterality: N/A;   of skin muscle and subcutaneous tissue of perimeum  8cmx12cm area   . IR GENERIC HISTORICAL  01/08/2016   IR RADIOLOGIST EVAL & MGMT 01/08/2016 [facility] INTERV RAD  . LAPAROSCOPIC APPENDECTOMY N/A 12/16/2015   Procedure: APPENDECTOMY LAPAROSCOPIC;  Surgeon: Coralie Keens, MD;  Location: [facility];  Service: General;  Laterality: N/A;  . PENECTOMY     HPI:  Pt is a 43 y/o male admitted secondary to speech difficulties and gait instability. MRI revealed a 1cm acute infarct at the junction of the anterolateral thalamus on the L and the posterior limb of the internal capsule. PMH including but not limited to DM, HTN, CVA (right ventral pons) and blind in R eye. Passed [employer] Screen however difficulty swallowing/secretion management reported per OT.    Assessment / Plan / Recommendation Clinical Impression   Patient presents with inconsistent coughing with liquids; question discoordination/timing. Pt appears to piecemeal thin liquids, with coughing/choking after second swallow. He feels he has had an increase in the amount of his saliva; he is able to swallow and clear  it, though his vocal quality is intermittently wet. Explosive coughing after liquid wash of solid. He tolerated purees without overt signs of aspiration. Recommend he remain NPO pending MBS to be performed next date; may have ice chips after oral care and necessary medications crushed in puree.      SLP Visit Diagnosis: Dysphagia, unspecified (R13.10)    Aspiration Risk  Moderate aspiration risk    Diet Recommendation Ice chips PRN after oral care;NPO except meds   Medication Administration: Crushed with puree    Other  Recommendations Oral Care Recommendations: Oral care QID;Oral care prior to ice chip/H20 Other Recommendations: Prohibited food (jello, ice cream, thin soups);Remove water pitcher   Follow up Recommendations Other (comment)(tbd)      Frequency and Duration            Prognosis Prognosis for Safe Diet Advancement: Good      Swallow Study   General Date of Onset: 04/29/18 HPI: Pt is a 43 y/o male admitted secondary to speech difficulties and gait instability. MRI revealed a 1cm acute infarct at the junction of the anterolateral thalamus on the L and the posterior limb of the internal capsule. PMH including but not limited to DM, HTN, CVA (right ventral pons) and blind in R eye. Passed [employer] Screen however difficulty swallowing/secretion management reported per OT.  Type of Study: Bedside Swallow Evaluation Previous Swallow Assessment: none in chart; passed [place] Diet Prior to this Study: NPO Temperature Spikes Noted: No Respiratory Status: Room air History of  Recent Intubation: No Behavior/Cognition: Alert;Cooperative Oral Cavity Assessment: Within Functional Limits Oral Care Completed by SLP: No Oral Cavity - Dentition: Adequate natural dentition Vision: Functional for self-feeding Self-Feeding Abilities: Able to feed self Patient Positioning: Upright in bed Baseline Vocal Quality: Normal;Other (comment)(intermittently wet) Volitional Cough:  Strong Volitional Swallow: Able to elicit    Oral/Motor/Sensory Function Overall Oral Motor/Sensory Function: Within functional limits   Ice Chips Ice chips: Within functional limits   Thin Liquid Thin Liquid: Impaired Oral Phase Functional Implications: Other (comment);Prolonged oral transit(appears to piecemeal liquids) Pharyngeal  Phase Impairments: Suspected delayed Swallow;Cough - Immediate;Cough - Delayed    Nectar Thick Nectar Thick Liquid: Not tested   Honey Thick Honey Thick Liquid: Not tested   Puree Puree: Within functional limits Presentation: Self Fed;Spoon   Solid     Solid: Impaired Presentation: Self Fed Oral Phase Functional Implications: Oral residue Pharyngeal Phase Impairments: Cough - Immediate(suspect discoordination with liquid wash)     Deneise Lever, MS, [place] Pager: [phone] Office: [phone]   Aliene Altes 04/30/2018,4:57 PM

## 2018-04-30 NOTE — Progress Notes (Addendum)
[facility] PROGRESS NOTE  Frank Schneider MCN:[id number] DOB: 1975-10-20 DOA: 04/29/2018 PCP: Lanae Boast, FNP  Assessment/Plan: 1. acute Ischemic CVA  Presented 04/29/18 with ~2 days of speech difficulty, off-balance gait, and reported transient difficulty swallowing.  MRI reveals acute ischemic CVA involving left anterolateral thalamus/posterior limb internal capsule - LDL 88 -HgA1c 7 - echo results pending -MRA with Stable negative intracranial MRA. No large vessel occlusion. No proximal high-grade or correctable stenosis - carotid doppler results pending.  -No events on tele -PT/OT eval with no needs. Speech re-ordered as patient developed difficulty with managing own secretions, chewing and swallowing.  - continue asa and plavix -await neuro recommendations -npo for now  2. Hypertension  - BP elevated  - Holding Norvasc and lisinopril in setting of acute ischemic CVA and use labetalol IVP's as needed for SBP >220 or DBP >120    3. Insulin-dependent DM  - A1c was 7.5% in July 2019  - Managed at home with Lantus 30 units qHS and metformin  - Check CBG's, continue Lantus and use a SSI with Novolog while in hospital     Code Status: full Family Communication: significant other at bedside Disposition Plan: home hopefully tomorrow   Consultants:  sethi neuro  Procedures:  Echo results pending  Carotid doppler results pending   HPI/Subjective: Patient with 2 day hx intermittent dysphagia had mri confirming acute stroke. Continues with varying degrees of dysphagia at worst cannot manage own secretions at best able to chew and swallow.     Objective: Vitals:   04/30/18 1205 04/30/18 1252  BP: (!) 171/83 (!) 169/69  Pulse: 64   Resp: 20   Temp:  98.7 F (37.1 C)  SpO2: 99%     Intake/Output Summary (Last 24 hours) at 04/30/2018 1441 Last data filed at 04/30/2018 1303 Gross per 24 hour  Intake 240 ml  Output -  Net 240 ml   Filed Weights   04/29/18  1529 04/30/18 0244  Weight: 81.6 kg 86.1 kg    Exam:   General:  Awake alert oriented x3  Cardiovascular: rrr no mgr no LE edema  Respiratory: normal effort BS clear bilaterally no wheeze  Abdomen: soft non-distended non-tender +BS no guarding or rebounding  Musculoskeletal: joints without swelling/erythema full rom  Neuro: alert oriented x3 bilateral grip 5/5 MAE mild/subtle slurred speech at times tongue midline   Data Reviewed: Basic Metabolic Panel: Recent Labs  Lab 04/29/18 1602 04/29/18 1612  NA 137 137  K 4.2 4.2  CL 100 101  CO2 26  --   GLUCOSE 299* 294*  BUN 10 9  CREATININE 0.94 0.90  CALCIUM 8.8*  --    Liver Function Tests: Recent Labs  Lab 04/29/18 1602  AST 25  ALT 18  ALKPHOS 59  BILITOT 1.0  PROT 7.4  ALBUMIN 4.5   No results for input(s): LIPASE, AMYLASE in the last 168 hours. No results for input(s): AMMONIA in the last 168 hours. CBC: Recent Labs  Lab 04/29/18 1602 04/29/18 1612  WBC 8.4  --   NEUTROABS 6.0  --   HGB 12.7* 15.0  HCT 42.4 44.0  MCV 65.3*  --   PLT 177  --    Cardiac Enzymes: No results for input(s): CKTOTAL, CKMB, CKMBINDEX, TROPONINI in the last 168 hours. BNP (last 3 results) No results for input(s): BNP in the last 8760 hours.  ProBNP (last 3 results) No results for input(s): PROBNP in the last 8760 hours.  CBG: Recent Labs  Lab 04/29/18 1558 04/29/18 2238 04/30/18 0717 04/30/18 1131  GLUCAP 300* 171* 77 138*    No results found for this or any previous visit (from the past 240 hour(s)).   Studies: Ct Head Wo Contrast  Result Date: 04/29/2018 CLINICAL DATA:  Recent assault EXAM: CT HEAD WITHOUT CONTRAST TECHNIQUE: Contiguous axial images were obtained from the base of the skull through the vertex without intravenous contrast. COMPARISON:  05/10/2015 FINDINGS: Brain: A somewhat ovoid area of vague decreased attenuation is noted adjacent to the left thalamus in the region of internal capsule. This is  consistent with focal ischemia likely of a subacute nature. No findings to suggest acute hemorrhage or space-occupying mass lesion are noted. Vascular: No hyperdense vessel or unexpected calcification. Skull: Normal. Negative for fracture or focal lesion. Sinuses/Orbits: No acute finding. Other: None. IMPRESSION: Area of decreased attenuation identified on the left consistent with subacute ischemia. No other focal abnormality is noted. Electronically Signed   By: Inez Catalina M.D.   On: 04/29/2018 16:52   Mr Brain Wo Contrast  Result Date: 04/29/2018 CLINICAL DATA:  Acute presentation with ataxia and suspected stroke. EXAM: MRI HEAD WITHOUT CONTRAST TECHNIQUE: Multiplanar, multiecho pulse sequences of the brain and surrounding structures were obtained without intravenous contrast. COMPARISON:  Head CT same day.  MRI 05/10/2015. FINDINGS: Brain: Diffusion imaging shows an acute 1 cm infarction at the junction of the anterolateral thalamus on the left and the posterior limb internal capsule. No evidence of swelling or hemorrhage. No other acute infarction. Old infarction in the right side of the pons. No focal cerebellar insult. Old small vessel infarctions of the thalami and of the hemispheric white matter, most prominent in the frontal white matter. No large vessel territory infarction. No mass lesion, hemorrhage, hydrocephalus or extra-axial collection. Vascular: Major vessels at the base of the brain show flow. Skull and upper cervical spine: Negative Sinuses/Orbits: Clear sinuses. Abnormal right globe, possibly with retinal detachment/hemorrhage or a retinal mass. Other: None IMPRESSION: 1 cm acute infarction at the junction of the anterolateral thalamus on the left and the posterior limb internal capsule. Old ischemic changes elsewhere affecting the brain as above. Abnormal right globe with probable retinal detachment/hemorrhage. Electronically Signed   By: Nelson Chimes M.D.   On: 04/29/2018 18:54   Mr Jodene Nam  Head Wo Contrast  Result Date: 04/29/2018 CLINICAL DATA:  Initial evaluation for acute stroke. EXAM: MRA HEAD WITHOUT CONTRAST TECHNIQUE: Angiographic images of the Circle of Willis were obtained using MRA technique without intravenous contrast. COMPARISON:  Prior MRI from earlier same day as well as previous MRI from 05/10/2015. FINDINGS: ANTERIOR CIRCULATION: Distal cervical segments of the internal carotid arteries are widely patent with antegrade flow. Petrous, cavernous, and supraclinoid segments widely patent bilaterally. ICA termini well perfused. A1 segments patent bilaterally. Normal anterior communicating artery. Anterior cerebral arteries widely patent to their distal aspects without stenosis. M1 segments widely patent bilaterally. Mild focal irregularity at the takeoff of the left M2 superior division favored to be related to focal tortuosity. This is similar to previous MRA. MCA bifurcations otherwise unremarkable. Distal MCA branches well perfused and symmetric. POSTERIOR CIRCULATION: Vertebral arteries widely patent to the vertebrobasilar junction. Posterior inferior cerebral arteries not well seen. Basilar widely patent to its distal aspect without stenosis. Superior cerebral arteries patent bilaterally. Both of the posterior cerebral artery supplied via the basilar and are well perfused to their distal aspects. IMPRESSION: Stable negative intracranial MRA. No large vessel occlusion. No proximal high-grade or correctable stenosis.  Electronically Signed   By: Jeannine Boga M.D.   On: 04/29/2018 22:44    Scheduled Meds: . aspirin  300 mg Rectal Daily   Or  . aspirin  325 mg Oral Daily  . brimonidine  1 drop Right Eye BID  . clopidogrel  75 mg Oral Daily  . cyclopentolate  2 drop Right Eye Daily  . insulin aspart  0-5 Units Subcutaneous QHS  . insulin aspart  0-9 Units Subcutaneous TID WC  . insulin glargine  15 Units Subcutaneous QHS  . neomycin-polymyxin b-dexamethasone  1 drop  Right Eye QHS   Continuous Infusions: . sodium chloride    . sodium chloride 75 mL/hr at 04/30/18 1255    Principal Problem:   Ischemic stroke [facility]) Active Problems:   Diabetes mellitus type 2 with complications, uncontrolled ([place])   Essential hypertension   Dysphagia   Blind right eye   History of CVA (cerebrovascular accident)    Time spent: 18 minutes    [facility] M NP [facility] If 7PM-7AM, please contact night-coverage at [email or web address], password [facility] 04/30/2018, 2:41 PM  LOS: 0 days     Patient was seen, examined,treatment plan was discussed with the Advance Practice Provider.  I have personally reviewed the clinical findings, labs, EKG, imaging studies and management of this patient in detail. I have also reviewed the orders written for this patient which were under my direction. I agree with the documentation, as recorded by the Advance Practice Provider.   Atif Zurn is a 43 y.o. male here with a new CVA.  Work up in progress.  Neurology consult appreciated.     Geradine Girt, DO

## 2018-04-30 NOTE — Progress Notes (Signed)
  Echocardiogram 2D Echocardiogram has been performed.  Frank Schneider 04/30/2018, 9:46 AM
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
## 2018-04-30 NOTE — Evaluation (Signed)
Speech Language Pathology Evaluation Patient Details Name: Frank Schneider MRN: [id number] DOB: [birth year] Today's Date: 04/30/2018 Time: 4270-6237 SLP Time Calculation (min) (ACUTE ONLY): 26 min  Problem List:  Patient Active Problem List   Diagnosis Date Noted  . Dysphagia 04/30/2018  . Ischemic stroke ([place]) 04/29/2018  . 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 Acute appendicitis 12/16/2015  . Right cataract 10/04/2015  . Tobacco dependence 10/04/2015  . Absolute anemia 08/07/2015  . Neuropathy 08/07/2015  . Cerebrovascular accident (CVA) ([place])   . Leukocytosis   . Stroke (cerebrum) ([place]) 05/09/2015  . Type 2 diabetes mellitus with circulatory disorder ([place]) 03/12/2015  . Hyperlipidemia 02/14/2015  . History of CVA (cerebrovascular accident) 02/14/2015  . CVA (cerebral vascular accident) ([place]) 01/01/2015  . Diabetes mellitus type 2 with complications, uncontrolled ([place]) 01/01/2015  . Stroke ([place])   . Essential hypertension   . Smoker   . Tobacco use    Past Medical History:  Past Medical History:  Diagnosis Date  . Blind right eye   . Diabetes mellitus    Takes Metformin  . Hypertension   . Priapism   . Stroke ([place])   . Tobacco use   . Vision abnormalities    Past Surgical History:  Past Surgical History:  Procedure Laterality Date  . APPENDECTOMY    . EYE SURGERY     cataract removed 05/27/2016 right eye   . INCISION AND DRAINAGE PERIRECTAL ABSCESS  07/28/2011   Procedure: IRRIGATION AND DEBRIDEMENT PERIRECTAL ABSCESS;  Surgeon: Adin Hector, MD;  Location: [facility]S;  Service: General;  Laterality: N/A;   of skin muscle and subcutaneous tissue of perimeum  8cmx12cm area   . IR GENERIC HISTORICAL  01/08/2016   IR RADIOLOGIST EVAL & MGMT 01/08/2016 [facility] INTERV RAD  . LAPAROSCOPIC APPENDECTOMY N/A 12/16/2015   Procedure: APPENDECTOMY LAPAROSCOPIC;  Surgeon: Coralie Keens, MD;  Location: [facility] ORS;  Service: General;  Laterality: N/A;  . PENECTOMY     HPI:  Frank Schneider is a 43 y/o  male admitted secondary to speech difficulties and gait instability. MRI revealed a 1cm acute infarct at the junction of the anterolateral thalamus on the L and the posterior limb of the internal capsule. PMH including but not limited to DM, HTN, CVA (right ventral pons) and blind in R eye. Passed [employer] Screen however difficulty swallowing/secretion management reported per OT.    Assessment / Plan / Recommendation Clinical Impression   Frank Schneider presents with mild cognitive-linguistic deficits in verbal organization/fluency and executive function. Frank Schneider with emergent awareness of difficulties. He scored 25/30 on MOCA- Basic (26 and above is considered within normal limits). Conversation noted for slow processing, hesitations, and wordfinding difficulties (functional with extended time allowed); Frank Schneider reports difficulties organizing his thoughts. Clock drawing is impaired, with poor spacing, incorrect hand placement ("I don't think that's right."). Frank Schneider aware of perseveration during divergent naming tasks: "my brain is frozen." Skilled ST recommended to maximize cognitive and communicative function. He expresses a desire to communicate better; would prefer [facility] SLP services. Will follow acutely.     SLP Assessment  SLP Recommendation/Assessment: Patient needs continued Speech Lanaguage Pathology Services SLP Visit Diagnosis: Cognitive communication deficit (R41.841);Aphasia (R47.01)    Follow Up Recommendations  Home health SLP    Frequency and Duration min 1 x/week  1 week      SLP Evaluation Cognition  Overall Cognitive Status: Impaired/Different from baseline Arousal/Alertness: Awake/alert Orientation Level: Oriented X4 Attention: Focused;Sustained;Selective;Alternating Focused Attention: Appears intact Sustained Attention: Appears intact Selective Attention:  Appears intact Alternating Attention: Appears intact Memory: Appears intact(Delayed recall 3/5, 5/5 with category cue) Awareness: Appears  intact Problem Solving: Appears intact Executive Function: Organizing;Reasoning Reasoning: Appears intact Organizing: Impaired Organizing Impairment: Verbal complex(clockdrawing impaired, Frank Schneider has difficulty organizing thoughts) Safety/Judgment: Appears intact       Comprehension  Auditory Comprehension Overall Auditory Comprehension: Appears within functional limits for tasks assessed Visual Recognition/Discrimination Discrimination: Within Function Limits Reading Comprehension Reading Status: Not tested    Expression Expression Primary Mode of Expression: Verbal Verbal Expression Overall Verbal Expression: Impaired Initiation: No impairment Automatic Speech: Name;Social Response Level of Generative/Spontaneous Verbalization: Conversation Repetition: No impairment Naming: Impairment Responsive: 76-100% accurate Confrontation: Within functional limits Divergent: 50-74% accurate Other Naming Comments: hesitation, slow word retrieval in conversation Verbal Errors: Aware of errors Effective Techniques: Open ended questions Written Expression Dominant Hand: Right Written Expression: Not tested   Oral / Motor  Oral Motor/Sensory Function Overall Oral Motor/Sensory Function: Within functional limits Motor Speech Overall Motor Speech: Appears within functional limits for tasks assessed   [place], [place], CCC-SLP Speech-Language Pathologist Acute Rehabilitation Services Pager: [phone] Office: [phone]  Aliene Altes 04/30/2018, 5:12 PM

## 2018-05-01 DIAGNOSIS — H5440 Blindness, one eye, unspecified eye: Secondary | ICD-10-CM

## 2018-05-01 DIAGNOSIS — R1310 Dysphagia, unspecified: Secondary | ICD-10-CM

## 2018-05-01 DIAGNOSIS — Z8673 Personal history of transient ischemic attack (TIA), and cerebral infarction without residual deficits: Secondary | ICD-10-CM

## 2018-05-01 LAB — CBC WITH DIFFERENTIAL/PLATELET
Abs Immature Granulocytes: 0.02 10*3/uL (ref 0.00–0.07)
BASOS PCT: 0 %
Basophils Absolute: 0 10*3/uL (ref 0.0–0.1)
Eosinophils Absolute: 0.3 10*3/uL (ref 0.0–0.5)
Eosinophils Relative: 3 %
HCT: 37.3 % — ABNORMAL LOW (ref 39.0–52.0)
Hemoglobin: 11.2 g/dL — ABNORMAL LOW (ref 13.0–17.0)
Immature Granulocytes: 0 %
Lymphocytes Relative: 29 %
Lymphs Abs: 2.8 10*3/uL (ref 0.7–4.0)
MCH: 19.4 pg — AB (ref 26.0–34.0)
MCHC: 30 g/dL (ref 30.0–36.0)
MCV: 64.5 fL — ABNORMAL LOW (ref 80.0–100.0)
MONO ABS: 0.8 10*3/uL (ref 0.1–1.0)
Monocytes Relative: 8 %
Neutro Abs: 6 10*3/uL (ref 1.7–7.7)
Neutrophils Relative %: 60 %
Platelets: 172 10*3/uL (ref 150–400)
RBC: 5.78 MIL/uL (ref 4.22–5.81)
RDW: 15.5 % (ref 11.5–15.5)
WBC: 9.9 10*3/uL (ref 4.0–10.5)
nRBC: 0 % (ref 0.0–0.2)

## 2018-05-01 LAB — COMPREHENSIVE METABOLIC PANEL
ALT: 13 U/L (ref 0–44)
ANION GAP: 7 (ref 5–15)
AST: 15 U/L (ref 15–41)
Albumin: 3.3 g/dL — ABNORMAL LOW (ref 3.5–5.0)
Alkaline Phosphatase: 43 U/L (ref 38–126)
BUN: 6 mg/dL (ref 6–20)
CO2: 26 mmol/L (ref 22–32)
Calcium: 8.1 mg/dL — ABNORMAL LOW (ref 8.9–10.3)
Chloride: 108 mmol/L (ref 98–111)
Creatinine, Ser: 0.92 mg/dL (ref 0.61–1.24)
Glucose, Bld: 235 mg/dL — ABNORMAL HIGH (ref 70–99)
Potassium: 3.3 mmol/L — ABNORMAL LOW (ref 3.5–5.1)
Sodium: 141 mmol/L (ref 135–145)
TOTAL PROTEIN: 5.6 g/dL — AB (ref 6.5–8.1)
Total Bilirubin: 0.8 mg/dL (ref 0.3–1.2)

## 2018-05-01 LAB — HIV ANTIBODY (ROUTINE TESTING W REFLEX): HIV Screen 4th Generation wRfx: NONREACTIVE

## 2018-05-01 LAB — GLUCOSE, CAPILLARY
Glucose-Capillary: 103 mg/dL — ABNORMAL HIGH (ref 70–99)
Glucose-Capillary: 115 mg/dL — ABNORMAL HIGH (ref 70–99)
Glucose-Capillary: 136 mg/dL — ABNORMAL HIGH (ref 70–99)
Glucose-Capillary: 178 mg/dL — ABNORMAL HIGH (ref 70–99)
Glucose-Capillary: 228 mg/dL — ABNORMAL HIGH (ref 70–99)

## 2018-05-01 LAB — PHOSPHORUS: Phosphorus: 3.8 mg/dL (ref 2.5–4.6)

## 2018-05-01 LAB — MAGNESIUM: MAGNESIUM: 1.8 mg/dL (ref 1.7–2.4)

## 2018-05-01 MED ORDER — POTASSIUM CHLORIDE CRYS ER 20 MEQ PO TBCR
40.0000 meq | EXTENDED_RELEASE_TABLET | Freq: Two times a day (BID) | ORAL | Status: DC
  Administered 2018-05-01: 40 meq via ORAL
  Filled 2018-05-01: qty 2

## 2018-05-01 MED ORDER — ATORVASTATIN CALCIUM 20 MG PO TABS: 20.0000 mg | ORAL_TABLET | Freq: Every day | ORAL | 11 refills | Status: DC

## 2018-05-01 MED ORDER — ASPIRIN 325 MG PO TABS: 325.0000 mg | ORAL_TABLET | Freq: Every day | ORAL | 0 refills | Status: DC

## 2018-05-01 MED ORDER — CLOPIDOGREL BISULFATE 75 MG PO TABS: 75.0000 mg | ORAL_TABLET | Freq: Every day | ORAL | 0 refills | Status: DC

## 2018-05-01 MED ORDER — ATORVASTATIN CALCIUM 40 MG PO TABS: 40.0000 mg | ORAL_TABLET | Freq: Every day | ORAL | 11 refills | Status: DC
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
## 2018-05-01 NOTE — Progress Notes (Signed)
SLP Cancellation Note  Patient Details Name: Frank Schneider MRN: [id number] DOB: 05-16-1975   Cancelled treatment:       Reason Eval/Treat Not Completed: Other (comment). Attempting to schedule MBS for pt; fluoro reporting they are unable to schedule at this time. Informed of NPO status and priority needs for MBS. Will continue efforts.  Deneise Lever, [place], CCC-SLP Speech-Language Pathologist Acute Rehabilitation Services Pager: [phone] Office: [phone]    Aliene Altes 05/01/2018, 8:07 AM

## 2018-05-01 NOTE — Progress Notes (Signed)
  Speech Language Pathology Treatment: Dysphagia  Patient Details Name: Frank Schneider MRN: [id number] DOB: 05-17-1975 Today's Date: 05/01/2018 Time: 5945-8592 SLP Time Calculation (min) (ACUTE ONLY): 24 min  Assessment / Plan / Recommendation Clinical Impression  SLP followed up with pt at bedside as unable to perform MBS this date. Pt sleeping but easily awoken, declined oral care, inspection of oral cavity reveals no pooling of saliva or secretions. Trials of nectar-thick liquids were tolerated well; swallow appeared swift and timely. Progressed to trials of thin liquids, purees and solids; there are no overt signs of aspiration this date, and coordination appeared adequate. SLP questions behavioral component. When this clinician pointed out pt appeared to be tolerating liquids better today, he agreed, but then did appear to begin piecemealing boluses or demonstrate multiple subswallows, and cleared his throat x2. Dysphagia does not appear consistent with neurologic pattern. Recommend deferring MBS at this time, initiate regular diet, thin liquids with intermittent supervision, meds whole in puree. Hold POs if coughing, choking, or respiratory distress with intake. SLP will f/u for tolerance with meal.     HPI HPI: Pt is a 43 y/o male admitted secondary to speech difficulties and gait instability. MRI revealed a 1cm acute infarct at the junction of the anterolateral thalamus on the L and the posterior limb of the internal capsule. PMH including but not limited to DM, HTN, CVA (right ventral pons) and blind in R eye. Passed [employer] Screen however difficulty swallowing/secretion management reported per OT.       SLP Plan  Continue with current plan of care       Recommendations  Diet recommendations: Regular;Thin liquid Liquids provided via: Cup;Straw Medication Administration: Whole meds with puree Supervision: Patient able to self feed;Full supervision/cueing for compensatory  strategies Compensations: Slow rate;Small sips/bites                Oral Care Recommendations: Oral care BID Follow up Recommendations: Home health SLP(for cognition/communication) SLP Visit Diagnosis: Dysphagia, unspecified (R13.10) Plan: Continue with current plan of care       [place], [place], [place] Pathologist Acute Rehabilitation Services Pager: [phone] Office: [phone]  Aliene Altes 05/01/2018, 11:50 AM
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
## 2018-05-01 NOTE — Progress Notes (Addendum)
Pt and family given discharge teaching and follow up instructions. Verbalized understanding and able to teach back. No new questions or concerns. IV and telemetry removed.   Family to transport home, discharged from unit by staff in wheelchair.  Patient would like SLP therapy outpt. Will give patient info to CSW tomorrow.

## 2018-05-02 MED FILL — ATORVASTATIN CALCIUM 40 MG: 40 | 30 days supply | Qty: 30 | Fill #0

## 2018-05-02 MED FILL — metFORMIN HCL 500 MG TABS: 500 | 30 days supply | Qty: 60 | Fill #1

## 2018-05-02 MED FILL — LISINOPRIL 20 MG TAB: 20 | 30 days supply | Qty: 30 | Fill #3

## 2018-05-02 MED FILL — CLOPIDOGREL 75 MG TABLET: 75 | 21 days supply | Qty: 21 | Fill #0

## 2018-05-02 MED FILL — LANTUS 100 UNITS/ML VIAL: 100 | 28 days supply | Qty: 10 | Fill #4

## 2018-05-09 ENCOUNTER — Encounter: Payer: Self-pay | Admitting: Family Medicine

## 2018-05-09 ENCOUNTER — Ambulatory Visit (INDEPENDENT_AMBULATORY_CARE_PROVIDER_SITE_OTHER): Payer: Medicaid Other | Admitting: Family Medicine

## 2018-05-09 VITALS — BP 140/82 | HR 60 | Temp 98.3°F | Resp 16 | Ht 70.5 in | Wt 196.0 lb

## 2018-05-09 DIAGNOSIS — F1290 Cannabis use, unspecified, uncomplicated: Secondary | ICD-10-CM

## 2018-05-09 DIAGNOSIS — Z794 Long term (current) use of insulin: Secondary | ICD-10-CM | POA: Diagnosis not present

## 2018-05-09 DIAGNOSIS — E1159 Type 2 diabetes mellitus with other circulatory complications: Secondary | ICD-10-CM | POA: Diagnosis not present

## 2018-05-09 DIAGNOSIS — F17200 Nicotine dependence, unspecified, uncomplicated: Secondary | ICD-10-CM | POA: Diagnosis not present

## 2018-05-09 DIAGNOSIS — Z8673 Personal history of transient ischemic attack (TIA), and cerebral infarction without residual deficits: Secondary | ICD-10-CM | POA: Diagnosis not present

## 2018-05-09 DIAGNOSIS — I10 Essential (primary) hypertension: Secondary | ICD-10-CM

## 2018-05-09 LAB — POCT URINALYSIS DIPSTICK
Bilirubin, UA: NEGATIVE
Glucose, UA: NEGATIVE
Ketones, UA: NEGATIVE
Nitrite, UA: NEGATIVE
Protein, UA: POSITIVE — AB
Spec Grav, UA: 1.025 (ref 1.010–1.025)
Urobilinogen, UA: 0.2 E.U./dL
pH, UA: 5.5 (ref 5.0–8.0)
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
## 2018-05-09 NOTE — Patient Instructions (Signed)
Tobacco Use Disorder Tobacco use disorder (TUD) occurs when a person craves, seeks, and uses tobacco, regardless of the consequences. This disorder can cause problems with mental and physical health. It can affect your ability to have healthy relationships, and it can keep you from meeting your responsibilities at work, home, or school. Tobacco may be:  Smoked as a cigarette or cigar.  Inhaled using e-cigarettes.  Smoked in a pipe or hookah.  Chewed as smokeless tobacco.  Inhaled into the nostrils as snuff. Tobacco products contain a dangerous chemical called nicotine, which is very addictive. Nicotine triggers hormones that make the body feel stimulated and works on areas of the brain that make you feel good. These effects can make it hard for people to quit nicotine. Tobacco contains many other unsafe chemicals that can damage almost every organ in the body. Smoking tobacco also puts others in danger due to fire risk and possible health problems caused by breathing in secondhand smoke. What are the signs or symptoms? Symptoms of TUD may include:  Being unable to slow down or stop your tobacco use.  Spending an abnormal amount of time getting or using tobacco.  Craving tobacco. Cravings may last for up to 6 months after quitting.  Tobacco use that: ? Interferes with your work, school, or home life. ? Interferes with your personal and social relationships. ? Makes you give up activities that you once enjoyed or found important.  Using tobacco even though you know that it is: ? Dangerous or bad for your health or someone else's health. ? Causing problems in your life.  Needing more and more of the substance to get the same effect (developing tolerance).  Experiencing unpleasant symptoms if you do not use the substance (withdrawal). Withdrawal symptoms may include: ? Depressed, anxious, or irritable mood. ? Difficulty concentrating. ? Increased appetite. ? Restlessness or trouble  sleeping.  Using the substance to avoid withdrawal. How is this diagnosed? This condition may be diagnosed based on:  Your current and past tobacco use. Your health care provider may ask questions about how your tobacco use affects your life.  A physical exam. You may be diagnosed with TUD if you have at least two symptoms within a 12-month period. How is this treated? This condition is treated by stopping tobacco use. Many people are unable to quit on their own and need help. Treatment may include:  Nicotine replacement therapy (NRT). NRT provides nicotine without the other harmful chemicals in tobacco. NRT gradually lowers the dosage of nicotine in the body and reduces withdrawal symptoms. NRT is available as: ? Over-the-counter gums, lozenges, and skin patches. ? Prescription mouth inhalers and nasal sprays.  Medicine that acts on the brain to reduce cravings and withdrawal symptoms.  A type of talk therapy that examines your triggers for tobacco use, how to avoid them, and how to cope with cravings (behavioral therapy).  Hypnosis. This may help with withdrawal symptoms.  Joining a support group for others coping with TUD. The best treatment for TUD is usually a combination of medicine, talk therapy, and support groups. Recovery can be a long process. Many people start using tobacco again after stopping (relapse). If you relapse, it does not mean that treatment will not work. Follow these instructions at home:  Lifestyle  Do not use any products that contain nicotine or tobacco, such as cigarettes and e-cigarettes.  Avoid things that trigger tobacco use as much as you can. Triggers include people and situations that usually cause you   to use tobacco.  Avoid drinks that contain caffeine, including coffee. These may worsen some withdrawal symptoms.  Find ways to manage stress. Wanting to smoke may cause stress, and stress can make you want to smoke. Relaxation techniques such as  deep breathing, meditation, and yoga may help.  Attend support groups as needed. These groups are an important part of long-term recovery for many people. General instructions  Take over-the-counter and prescription medicines only as told by your health care provider.  Check with your health care provider before taking any new prescription or over-the-counter medicines.  Decide on a friend, family member, or smoking quit-line (such as 1-800-QUIT-NOW in the [place] that you can call or text when you feel the urge to smoke or when you need help coping with cravings.  Keep all follow-up visits as told by your health care provider and therapist. This is important. Contact a health care provider if:  You are not able to take your medicines as prescribed.  Your symptoms get worse, even with treatment. Summary  Tobacco use disorder (TUD) occurs when a person craves, seeks, and uses tobacco regardless of the consequences.  This condition may be diagnosed based on your current and past tobacco use and a physical exam.  Many people are unable to quit on their own and need help. Recovery can be a long process.  The most effective treatment for TUD is usually a combination of medicine, talk therapy, and support groups. This information is not intended to replace advice given to you by your health care provider. Make sure you discuss any questions you have with your health care provider. Document Released: 12/18/2003 Document Revised: 03/31/2017 Document Reviewed: 03/31/2017 Elsevier Interactive Patient Education  2019 [employer]. What You Need to Know About Marijuana Use Marijuana is a mixture of the dried leaves and flowers of the hemp plant Cannabis sativa. The plant's active ingredients (cannabinoids) change the chemistry of the brain. If you smoke or eat marijuana, you will experience changes in the way you think, feel, and behave. Many people use marijuana because it helps them relax and  puts them in a pleasurable mood (marijuana high). Some people use marijuana for medical effects, such as:  Reduced nausea.  Increased appetite.  Reduced muscle spasm.  Pain relief. Researchers are studying other possible medical uses for marijuana. How can marijuana use affect me? Many people find a marijuana high to be pleasurable and relaxing. Other people find a marijuana high to be uncomfortable or anxiety-causing. This drug can cause short-term and long-term physical and mental effects. Taking high doses of marijuana or trying to quit marijuana can also affect you. Short-term effects of marijuana use include:  Temporary relief of symptoms from a medical condition.  Changes in mood and perception (feeling high).  Increased hunger.  Increased heart rate.  Slowed movement and reaction time.  Poor memory, judgment, and problem solving ability.  Altered sense of time.  Changes to vision.  Bloodshot eyes.  Coughing. Long-term effects of marijuana use include:  Higher risk of lung and breathing problems.  Higher risk of heart attack.  Higher risk of testicular cancer.  Mental and physical dependence (addiction).  Slowed brain development in young people. Babies whose mothers used marijuana during pregnancy have an increased risk of problems with brain development and behavior.  Temporary periods of false perceptions or beliefs (hallucinations or paranoia).  Worsening of mental illness.  Onset of new mental illness such as anxiety, depression, or suicidal thoughts.  Withdrawal symptoms  when stopping marijuana, such as sleeplessness, anxiety, cravings, and anger.  Difficulty maintaining healthy relationships.  Poor memory, and difficulty concentrating and learning. This can result in decreased intelligence and poor performance at school or work, and an increased risk of dropping out of school.  Higher risk of using other substances like alcohol and nicotine. High  doses of marijuana can cause:  Panic.  Anxiety.  Mental confusion.  Hallucinations. Quitting marijuana after using it for a long time can cause withdrawal symptoms, such as:  Headache.  Shakiness.  Sweating.  Stomach pain.  Nausea.  Restlessness.  Irritability.  Trouble sleeping.  Decreased appetite. What are the benefits of not using marijuana? Not using marijuana can keep you from becoming dependent on it. You can avoid the negative effects of the drug that can reduce your quality of life. You can avoid accidents caused by the slowed reaction time that is common with marijuana use. If I already use marijuana, what steps can I take to stop using it? If you are not physically or mentally dependent on marijuana, you should be able to stop using it on your own. If you cannot stop on your own, ask your health care provider for help. Treatment for marijuana addiction is similar to treatment for other addictions. It may include:  Cognitive-behavioral therapy (psychotherapy). This may include individual or group therapy.  Joining a support group.  Treating medical, behavioral, or mental health conditions that exist along with marijuana dependency.  Where can I get more information? Learn more about:  Marijuana from the [place] on Drug Abuse: MissingBag.si  Medical marijuana from the [place]: LocatorExpress.is  Treatment options from the Substance Abuse and [place]: https://findtreatment.[employer].com.au  Recovery from marijuana dependency from Recovery.org: http://www.recovery.org/topics/marijuana-recovery When should I seek medical care? Talk with your health care provider if:  You want to stop using marijuana but you cannot.  You have withdrawal symptoms when you try to stop using marijuana.  You are using marijuana every day.  You are  using marijuana along with other drugs like cocaine or alcohol.  You have anxiety or depression.  You have hallucinations or paranoia.  Marijuana use is interfering with your relationships or your ability to function normally at school or at work. Summary  You may become physically or mentally dependent on marijuana.  Long-term use may interfere with your ability to function normally at home, school, or work.  Marijuana addiction is treatable. This information is not intended to replace advice given to you by your health care provider. Make sure you discuss any questions you have with your health care provider. Document Released: 04/05/2015 Document Revised: 01/01/2016 Document Reviewed: 01/01/2016 Elsevier Interactive Patient Education  2019 [employer]. Stroke Prevention Some medical conditions and lifestyle choices can lead to a higher risk for a stroke. You can help to prevent a stroke by making nutrition, lifestyle, and other changes. What nutrition changes can be made?   Eat healthy foods. ? Choose foods that are high in fiber. These include:  Fresh fruits.  Fresh vegetables.  Whole grains. ? Eat at least 5 or more servings of fruits and vegetables each day. Try to fill half of your plate at each meal with fruits and vegetables. ? Choose lean protein foods. These include:  Lowfat (lean) cuts of meat.  Chicken without skin.  Fish.  Tofu.  Beans.  Nuts. ? Eat low-fat dairy products. ? Avoid foods that:  Are high in salt (sodium).  Have  saturated fat.  Have trans fat.  Have cholesterol.  Are processed.  Are premade.  Follow eating guidelines as told by your doctor. These may include: ? Reducing how many calories you eat and drink each day. ? Limiting how much salt you eat or drink each day to 1,500 milligrams (mg). ? Using only healthy fats for cooking. These include:  Olive oil.  Canola oil.  Sunflower oil. ? Counting how many carbohydrates  you eat and drink each day. What lifestyle changes can be made?  Try to stay at a healthy weight. Talk to your doctor about what a good weight is for you.  Get at least 30 minutes of moderate physical activity at least 5 days a week. This can include: ? Fast walking. ? Biking. ? Swimming.  Do not use any products that have nicotine or tobacco. This includes cigarettes and e-cigarettes. If you need help quitting, ask your doctor. Avoid being around tobacco smoke in general.  Limit how much alcohol you drink to no more than 1 drink a day for nonpregnant women and 2 drinks a day for men. One drink equals 12 oz of beer, 5 oz of wine, or 1 oz of hard liquor.  Do not use drugs.  Avoid taking birth control pills. Talk to your doctor about the risks of taking birth control pills if: ? You are over 17 years old. ? You smoke. ? You get migraines. ? You have had a blood clot. What other changes can be made?  Manage your cholesterol. ? It is important to eat a healthy diet. ? If your cholesterol cannot be managed through your diet, you may also need to take medicines. Take medicines as told by your doctor.  Manage your diabetes. ? It is important to eat a healthy diet and to exercise regularly. ? If your blood sugar cannot be managed through diet and exercise, you may need to take medicines. Take medicines as told by your doctor.  Control your high blood pressure (hypertension). ? Try to keep your blood pressure below 130/80. This can help lower your risk of stroke. ? It is important to eat a healthy diet and to exercise regularly. ? If your blood pressure cannot be managed through diet and exercise, you may need to take medicines. Take medicines as told by your doctor. ? Ask your doctor if you should check your blood pressure at home. ? Have your blood pressure checked every year. Do this even if your blood pressure is normal.  Talk to your doctor about getting checked for a sleep  disorder. Signs of this can include: ? Snoring a lot. ? Feeling very tired.  Take over-the-counter and prescription medicines only as told by your doctor. These may include aspirin or blood thinners (antiplatelets or anticoagulants).  Make sure that any other medical conditions you have are managed. Where to find more information  American Stroke Association: www.strokeassociation.org  National Stroke Association: www.stroke.org Get help right away if:  You have any symptoms of stroke. "BE FAST" is an easy way to remember the main warning signs: ? B - Balance. Signs are dizziness, sudden trouble walking, or loss of balance. ? E - Eyes. Signs are trouble seeing or a sudden change in how you see. ? F - Face. Signs are sudden weakness or loss of feeling of the face, or the face or eyelid drooping on one side. ? A - Arms. Signs are weakness or loss of feeling in an arm. This happens suddenly  and usually on one side of the body. ? S - Speech. Signs are sudden trouble speaking, slurred speech, or trouble understanding what people say. ? T - Time. Time to call emergency services. Write down what time symptoms started.  You have other signs of stroke, such as: ? A sudden, very bad headache with no known cause. ? Feeling sick to your stomach (nausea). ? Throwing up (vomiting). ? Jerky movements you cannot control (seizure). These symptoms may represent a serious problem that is an emergency. Do not wait to see if the symptoms will go away. Get medical help right away. Call your local emergency services (911 in the [place] Do not drive yourself to the hospital. Summary  You can prevent a stroke by eating healthy, exercising, not smoking, drinking less alcohol, and treating other health problems, such as diabetes, high blood pressure, or high cholesterol.  Do not use any products that contain nicotine or tobacco, such as cigarettes and e-cigarettes.  Get help right away if you have any signs or  symptoms of a stroke. This information is not intended to replace advice given to you by your health care provider. Make sure you discuss any questions you have with your health care provider. Document Released: 10/13/2011 Document Revised: 07/15/2016 Document Reviewed: 07/15/2016 Elsevier Interactive Patient Education  2019 [employer].

## 2018-05-10 LAB — CBC WITH DIFFERENTIAL/PLATELET
Basophils Absolute: 0 10*3/uL (ref 0.0–0.2)
Basos: 0 %
EOS (ABSOLUTE): 0.2 10*3/uL (ref 0.0–0.4)
Eos: 2 %
Hematocrit: 41.2 % (ref 37.5–51.0)
Hemoglobin: 12.5 g/dL — ABNORMAL LOW (ref 13.0–17.7)
Immature Grans (Abs): 0 10*3/uL (ref 0.0–0.1)
Immature Granulocytes: 0 %
Lymphocytes Absolute: 2.3 10*3/uL (ref 0.7–3.1)
Lymphs: 21 %
MCH: 19.8 pg — ABNORMAL LOW (ref 26.6–33.0)
MCHC: 30.3 g/dL — ABNORMAL LOW (ref 31.5–35.7)
MCV: 65 fL — ABNORMAL LOW (ref 79–97)
Monocytes Absolute: 0.7 10*3/uL (ref 0.1–0.9)
Monocytes: 6 %
Neutrophils Absolute: 7.9 10*3/uL — ABNORMAL HIGH (ref 1.4–7.0)
Neutrophils: 71 %
Platelets: 186 10*3/uL (ref 150–450)
RBC: 6.32 x10E6/uL — ABNORMAL HIGH (ref 4.14–5.80)
RDW: 18.2 % — ABNORMAL HIGH (ref 11.6–15.4)
WBC: 11.1 10*3/uL — ABNORMAL HIGH (ref 3.4–10.8)

## 2018-05-10 LAB — COMPREHENSIVE METABOLIC PANEL
ALT: 13 IU/L (ref 0–44)
AST: 15 IU/L (ref 0–40)
Albumin/Globulin Ratio: 1.9 (ref 1.2–2.2)
Albumin: 4.4 g/dL (ref 3.5–5.5)
Alkaline Phosphatase: 66 IU/L (ref 39–117)
BUN/Creatinine Ratio: 11 (ref 9–20)
BUN: 10 mg/dL (ref 6–24)
Bilirubin Total: 0.4 mg/dL (ref 0.0–1.2)
CO2: 24 mmol/L (ref 20–29)
Calcium: 9.5 mg/dL (ref 8.7–10.2)
Chloride: 101 mmol/L (ref 96–106)
Creatinine, Ser: 0.92 mg/dL (ref 0.76–1.27)
GFR calc Af Amer: 118 mL/min/{1.73_m2} (ref >59)
GFR calc non Af Amer: 102 mL/min/{1.73_m2} (ref >59)
Globulin, Total: 2.3 g/dL (ref 1.5–4.5)
Glucose: 144 mg/dL — ABNORMAL HIGH (ref 65–99)
Potassium: 4.5 mmol/L (ref 3.5–5.2)
Sodium: 139 mmol/L (ref 134–144)
Total Protein: 6.7 g/dL (ref 6.0–8.5)

## 2018-05-10 LAB — MAGNESIUM: Magnesium: 1.9 mg/dL (ref 1.6–2.3)

## 2018-05-10 LAB — PHOSPHORUS: Phosphorus: 3 mg/dL (ref 2.8–4.1)

## 2018-05-11 ENCOUNTER — Telehealth: Payer: Self-pay

## 2018-05-11 NOTE — Telephone Encounter (Signed)
Patient is asking about referral for home health. Advised that we are in the process of this and will fax in when complete. Thanks!

## 2018-05-12 ENCOUNTER — Telehealth: Payer: Self-pay

## 2018-05-12 NOTE — Telephone Encounter (Signed)
Called and spoke with patient, advised that pcs form has been sent into state. Thanks!

## 2018-06-01 DIAGNOSIS — R112 Nausea with vomiting, unspecified: Secondary | ICD-10-CM | POA: Diagnosis not present

## 2018-06-01 DIAGNOSIS — K529 Noninfective gastroenteritis and colitis, unspecified: Secondary | ICD-10-CM | POA: Diagnosis not present

## 2018-06-02 ENCOUNTER — Emergency Department (HOSPITAL_COMMUNITY): Payer: Medicaid Other

## 2018-06-02 ENCOUNTER — Ambulatory Visit: Payer: Medicaid Other | Admitting: Family Medicine

## 2018-06-02 ENCOUNTER — Encounter (HOSPITAL_COMMUNITY): Payer: Self-pay

## 2018-06-02 ENCOUNTER — Emergency Department (HOSPITAL_COMMUNITY)
Admission: EM | Admit: 2018-06-02 | Discharge: 2018-06-02 | Disposition: A | Discharge status: Home / Self Care | Payer: Medicaid Other | Attending: Emergency Medicine | Admitting: Emergency Medicine

## 2018-06-02 DIAGNOSIS — Z794 Long term (current) use of insulin: Secondary | ICD-10-CM | POA: Insufficient documentation

## 2018-06-02 DIAGNOSIS — K529 Noninfective gastroenteritis and colitis, unspecified: Secondary | ICD-10-CM | POA: Diagnosis not present

## 2018-06-02 DIAGNOSIS — R1111 Vomiting without nausea: Secondary | ICD-10-CM | POA: Diagnosis not present

## 2018-06-02 DIAGNOSIS — R197 Diarrhea, unspecified: Secondary | ICD-10-CM | POA: Insufficient documentation

## 2018-06-02 DIAGNOSIS — H538 Other visual disturbances: Secondary | ICD-10-CM | POA: Diagnosis not present

## 2018-06-02 DIAGNOSIS — R0902 Hypoxemia: Secondary | ICD-10-CM | POA: Diagnosis not present

## 2018-06-02 DIAGNOSIS — R1110 Vomiting, unspecified: Secondary | ICD-10-CM | POA: Insufficient documentation

## 2018-06-02 DIAGNOSIS — Z79899 Other long term (current) drug therapy: Secondary | ICD-10-CM | POA: Insufficient documentation

## 2018-06-02 DIAGNOSIS — R1013 Epigastric pain: Secondary | ICD-10-CM | POA: Diagnosis not present

## 2018-06-02 DIAGNOSIS — F17210 Nicotine dependence, cigarettes, uncomplicated: Secondary | ICD-10-CM | POA: Diagnosis not present

## 2018-06-02 DIAGNOSIS — R112 Nausea with vomiting, unspecified: Secondary | ICD-10-CM

## 2018-06-02 DIAGNOSIS — E119 Type 2 diabetes mellitus without complications: Secondary | ICD-10-CM | POA: Insufficient documentation

## 2018-06-02 DIAGNOSIS — I10 Essential (primary) hypertension: Secondary | ICD-10-CM | POA: Insufficient documentation

## 2018-06-02 DIAGNOSIS — Z7901 Long term (current) use of anticoagulants: Secondary | ICD-10-CM | POA: Insufficient documentation

## 2018-06-02 LAB — URINALYSIS, ROUTINE W REFLEX MICROSCOPIC
Bacteria, UA: NONE SEEN
Bilirubin Urine: NEGATIVE
Glucose, UA: NEGATIVE mg/dL
Hgb urine dipstick: NEGATIVE
Ketones, ur: NEGATIVE mg/dL
Nitrite: NEGATIVE
Protein, ur: NEGATIVE mg/dL
Specific Gravity, Urine: >1.046 — ABNORMAL HIGH (ref 1.005–1.030)
pH: 5 (ref 5.0–8.0)

## 2018-06-02 LAB — COMPREHENSIVE METABOLIC PANEL
ALT: 16 U/L (ref 0–44)
AST: 18 U/L (ref 15–41)
Albumin: 4.4 g/dL (ref 3.5–5.0)
Alkaline Phosphatase: 62 U/L (ref 38–126)
Anion gap: 14 (ref 5–15)
BILIRUBIN TOTAL: 0.8 mg/dL (ref 0.3–1.2)
BUN: 8 mg/dL (ref 6–20)
CALCIUM: 9.4 mg/dL (ref 8.9–10.3)
CO2: 23 mmol/L (ref 22–32)
CREATININE: 1.07 mg/dL (ref 0.61–1.24)
Chloride: 104 mmol/L (ref 98–111)
GFR calc Af Amer: >60 mL/min (ref >60)
GFR calc non Af Amer: >60 mL/min (ref >60)
Glucose, Bld: 180 mg/dL — ABNORMAL HIGH (ref 70–99)
Potassium: 3.6 mmol/L (ref 3.5–5.1)
Sodium: 141 mmol/L (ref 135–145)
Total Protein: 7.3 g/dL (ref 6.5–8.1)

## 2018-06-02 LAB — CBC
HCT: 46.1 % (ref 39.0–52.0)
Hemoglobin: 13.9 g/dL (ref 13.0–17.0)
MCH: 19.4 pg — ABNORMAL LOW (ref 26.0–34.0)
MCHC: 30.2 g/dL (ref 30.0–36.0)
MCV: 64.4 fL — ABNORMAL LOW (ref 80.0–100.0)
Platelets: 184 10*3/uL (ref 150–400)
RBC: 7.16 MIL/uL — ABNORMAL HIGH (ref 4.22–5.81)
RDW: 17.5 % — ABNORMAL HIGH (ref 11.5–15.5)
WBC: 21.8 10*3/uL — ABNORMAL HIGH (ref 4.0–10.5)
nRBC: 0 % (ref 0.0–0.2)

## 2018-06-02 LAB — LACTIC ACID, PLASMA: Lactic Acid, Venous: 1.2 mmol/L (ref 0.5–1.9)

## 2018-06-02 LAB — C DIFFICILE QUICK SCREEN W PCR REFLEX
C Diff antigen: NEGATIVE
C Diff interpretation: NOT DETECTED
C Diff toxin: NEGATIVE

## 2018-06-02 MED ORDER — ONDANSETRON HCL 4 MG PO TABS: 4.0000 mg | ORAL_TABLET | Freq: Three times a day (TID) | ORAL | 0 refills | Status: DC | PRN

## 2018-06-02 MED ORDER — IOHEXOL 300 MG/ML  SOLN
100.0000 mL | Freq: Once | INTRAMUSCULAR | Status: AC | PRN
  Administered 2018-06-02: 100 mL via INTRAVENOUS

## 2018-06-02 MED ORDER — SODIUM CHLORIDE 0.9 % IV BOLUS
2000.0000 mL | Freq: Once | INTRAVENOUS | Status: AC
  Administered 2018-06-02: 2000 mL via INTRAVENOUS

## 2018-06-02 MED ORDER — SODIUM CHLORIDE 0.9 % IV SOLN
Freq: Once | INTRAVENOUS | Status: AC
  Administered 2018-06-02: 05:00:00 via INTRAVENOUS

## 2018-06-02 MED ORDER — ONDANSETRON HCL 4 MG/2ML IJ SOLN
4.0000 mg | Freq: Once | INTRAMUSCULAR | Status: AC
  Administered 2018-06-02: 4 mg via INTRAVENOUS
  Filled 2018-06-02: qty 2

## 2018-06-02 MED ORDER — SODIUM CHLORIDE 0.9 % IV BOLUS
1000.0000 mL | Freq: Once | INTRAVENOUS | Status: AC
  Administered 2018-06-02: 1000 mL via INTRAVENOUS

## 2018-06-02 NOTE — ED Provider Notes (Signed)
[facility] EMERGENCY DEPARTMENT Provider Note   CSN: [id number] Arrival date & time: 06/02/18  0207     History   Chief Complaint Chief Complaint  Patient presents with  . Emesis  . Diarrhea    HPI Frank Schneider is a 43 y.o. male.  Frank Schneider is a 43 y.o. male with medical history significant for insulin-dependent diabetes mellitus, hypertension, history of CVA, and history of traumatic right eye injury with blindness presents to the emergency department for evaluation of vomiting and diarrhea.  Symptoms began at 1 AM.  He has had approximately 7 episodes of vomiting with 4 episodes of diarrhea.  States that his nausea has improved since receiving 4 mg Zofran by EMS.  Triage note reports complaints of abdominal pain, though patient denies any abdominal discomfort during my assessment.  Further denies chest pain, cough, shortness of breath, fever, urinary symptoms.  Denies recent antibiotic use.  Was hospitalized 1 month ago for acute CVA.  No travel outside of the country.  Abdominal surgical history significant for appendectomy.  The history is provided by the patient. No language interpreter was used.  Emesis  Associated symptoms: diarrhea   Diarrhea  Associated symptoms: vomiting     Past Medical History:  Diagnosis Date  . Blind right eye   . Diabetes mellitus    Takes Metformin  . Hypertension   . Priapism   . Stroke ([place])   . Tobacco use   . Vision abnormalities     Patient Active Problem List   Diagnosis Date Noted  . Dysphagia 04/30/2018  . Ischemic stroke ([place]) 04/29/2018  . 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 Acute appendicitis 12/16/2015  . Right cataract 10/04/2015  . Tobacco dependence 10/04/2015  . Absolute anemia 08/07/2015  . Neuropathy 08/07/2015  . Cerebrovascular accident (CVA) ([place])   . Leukocytosis   . Stroke (cerebrum) ([place]) 05/09/2015  . Type 2 diabetes mellitus with circulatory disorder ([place]) 03/12/2015  .  Hyperlipidemia 02/14/2015  . History of CVA (cerebrovascular accident) 02/14/2015  . CVA (cerebral vascular accident) ([place]) 01/01/2015  . Diabetes mellitus type 2 with complications, uncontrolled ([place]) 01/01/2015  . Stroke ([place])   . Essential hypertension   . Smoker   . Tobacco use     Past Surgical History:  Procedure Laterality Date  . APPENDECTOMY    . EYE SURGERY     cataract removed 05/27/2016 right eye   . INCISION AND DRAINAGE PERIRECTAL ABSCESS  07/28/2011   Procedure: IRRIGATION AND DEBRIDEMENT PERIRECTAL ABSCESS;  Surgeon: Adin Hector, MD;  Location: [facility] ORS;  Service: General;  Laterality: N/A;   of skin muscle and subcutaneous tissue of perimeum  8cmx12cm area   . IR GENERIC HISTORICAL  01/08/2016   IR RADIOLOGIST EVAL & MGMT 01/08/2016 [facility] INTERV RAD  . LAPAROSCOPIC APPENDECTOMY N/A 12/16/2015   Procedure: APPENDECTOMY LAPAROSCOPIC;  Surgeon: Coralie Keens, MD;  Location: [facility] ORS;  Service: General;  Laterality: N/A;  . PENECTOMY          Home Medications    Prior to Admission medications   Medication Sig Start Date End Date Taking? Authorizing Provider  amLODipine (NORVASC) 10 MG tablet Take 1 tablet (10 mg total) by mouth daily. 06/14/17  Yes Dorena Dew, FNP  aspirin 325 MG tablet Take 1 tablet (325 mg total) by mouth daily. 05/02/18  Yes Sheikh, Omair Latif, DO  atorvastatin (LIPITOR) 40 MG tablet Take 1 tablet (40 mg total) by mouth daily. 05/01/18 05/01/19 Yes  Sheikh, Omair Latif, DO  clopidogrel (PLAVIX) 75 MG tablet Take 1 tablet (75 mg total) by mouth daily. 05/01/18  Yes Sheikh, Omair Latif, DO  ferrous sulfate 325 (65 FE) MG tablet Take 1 tablet (325 mg total) daily with breakfast by mouth. 03/12/17  Yes Dorena Dew, FNP  insulin glargine (LANTUS) 100 UNIT/ML injection INJECT 30 UNITS INTO THE SKIN AT BEDTIME. Patient taking differently: Inject 30 Units into the skin at bedtime.  06/14/17  Yes Dorena Dew, FNP  lisinopril (PRINIVIL,ZESTRIL) 20  MG tablet Take 1 tablet (20 mg total) by mouth daily. 06/14/17  Yes Dorena Dew, FNP  metFORMIN (GLUCOPHAGE) 500 MG tablet Take 1 tablet (500 mg total) by mouth 2 (two) times daily with a meal. 06/14/17  Yes Dorena Dew, FNP  atropine 1 % ophthalmic ointment Place 1 application into the right eye daily. Patient not taking: Reported on 06/02/2018 09/06/16   Deno Etienne, DO  Blood Glucose Monitoring Suppl (TRUE METRIX AIR GLUCOSE METER) DEVI 1 each by Does not apply route 2 (two) times daily at 10 AM and 5 PM. 06/15/17   Dorena Dew, FNP  glucose blood (TRUE METRIX BLOOD GLUCOSE TEST) test strip Use as instructed 06/15/17   Dorena Dew, FNP  Insulin Syringe-Needle U-100 (TRUEPLUS INSULIN SYRINGE) 31G X 5/16" 1 ML MISC USE AS DIRECTED. 03/12/17   Dorena Dew, FNP  Lancets (FREESTYLE) lancets Use as instructed 01/03/15   Reyne Dumas, MD  tobramycin (TOBREX) 0.3 % ophthalmic ointment Place 1 application into the right eye 3 (three) times daily. Patient not taking: Reported on 06/02/2018 09/06/16   Deno Etienne, DO    Family History Family History  Problem Relation Age of Onset  . Diabetes Mother   . Hypertension Mother   . Diabetes Maternal Grandmother   . Hypertension Maternal Grandmother   . Depression Maternal Grandmother     Social History Social History   Tobacco Use  . Smoking status: Current Some Day Smoker    Packs/day: 0.25    Years: 8.00    Pack years: 2.00    Types: Cigarettes  . Smokeless tobacco: Never Used  Substance Use Topics  . Alcohol use: No  . Drug use: Yes    Types: Marijuana    Comment: 3x wk     Allergies   Patient has no known allergies.   Review of Systems Review of Systems  Gastrointestinal: Positive for diarrhea and vomiting.  Ten systems reviewed and are negative for acute change, except as noted in the HPI.    Physical Exam Updated Vital Signs BP 127/69   Pulse 72   Temp 98.4 F (36.9 C) (Oral)   Resp 18   Ht 5\' 10"   (1.778 m)   Wt 89.8 kg   SpO2 96%   BMI 28.41 kg/m   Physical Exam Vitals signs and nursing note reviewed.  Constitutional:      General: He is not in acute distress.    Appearance: He is well-developed. He is not diaphoretic.     Comments: Nontoxic appearing and in NAD  HENT:     Head: Normocephalic and atraumatic.  Eyes:     General: No scleral icterus.    Conjunctiva/sclera: Conjunctivae normal.  Neck:     Musculoskeletal: Normal range of motion.  Cardiovascular:     Rate and Rhythm: Normal rate and regular rhythm.     Pulses: Normal pulses.  Pulmonary:     Effort: Pulmonary effort is  normal. No respiratory distress.     Breath sounds: No stridor. No wheezing or rales.     Comments: Lungs CTAB. Respirations even and unlabored. Abdominal:     General: There is no distension.     Palpations: Abdomen is soft. There is no mass.     Tenderness: There is no abdominal tenderness.     Comments: Soft, nondistended, nontender abdomen.  Musculoskeletal: Normal range of motion.  Skin:    General: Skin is warm and dry.     Coloration: Skin is not pale.     Findings: No erythema or rash.  Neurological:     General: No focal deficit present.     Mental Status: He is alert and oriented to person, place, and time.     Coordination: Coordination normal.  Psychiatric:        Behavior: Behavior normal.      ED Treatments / Results  Labs (all labs ordered are listed, but only abnormal results are displayed) Labs Reviewed  CBC - Abnormal; Notable for the following components:      Result Value   WBC 21.8 (*)    RBC 7.16 (*)    MCV 64.4 (*)    MCH 19.4 (*)    RDW 17.5 (*)    All other components within normal limits  COMPREHENSIVE METABOLIC PANEL - Abnormal; Notable for the following components:   Glucose, Bld 180 (*)    All other components within normal limits  C DIFFICILE QUICK SCREEN W PCR REFLEX    EKG None  Radiology No results found.  Procedures Procedures  (including critical care time)  Medications Ordered in ED Medications  sodium chloride 0.9 % bolus 2,000 mL (0 mLs Intravenous Stopped 06/02/18 0345)  0.9 %  sodium chloride infusion ( Intravenous New Bag/Given 06/02/18 0500)  ondansetron (ZOFRAN) injection 4 mg (4 mg Intravenous Given 06/02/18 0551)    4:44 AM Patient reassessed.  He continues to have loose stool, but denies abdominal pain.  His repeat abdominal exam is stable.  Abdomen soft, nontender.  No palpable masses or involuntary guarding.  His work-up is notable for leukocytosis of 21.8.  This is new for the patient; however, may be attributable to stress of frequent stooling and emesis.  Based on clinical exam, I do not see indication for CT.  His C. difficile is pending.  If positive, this could certainly explain his new leukocytosis as well.   Initial Impression / Assessment and Plan / ED Course  I have reviewed the triage vital signs and the nursing notes.  Pertinent labs & imaging results that were available during my care of the patient were reviewed by me and considered in my medical decision making (see chart for details).     43 year old male presents to the emergency department for vomiting and diarrhea which began acutely at 1 AM that he has continued to have frequent stooling while in the emergency department, but vomiting has been well controlled with Zofran.  He has a leukocytosis of 21.8 which is nonspecific.  No complaints of abdominal pain and stable, benign abdominal exam with no reproducible tenderness.  Given his recent hospitalization he is pending a C. difficile screen.  If positive, he will require treatment with oral vancomycin tablets.  Patient signed out to Margarita Mail, PA-C at change of shift pending labs.   Final Clinical Impressions(s) / ED Diagnoses   Final diagnoses:  Vomiting and diarrhea    ED Discharge Orders    None  Antonietta Breach, PA-C 06/02/18 1281    Maudie Flakes,  MD 06/02/18 [phone]

## 2018-06-02 NOTE — Discharge Instructions (Signed)
Take imodium for diarrhea. Continue to increase your fluids by mouth Do not take your blood pressure medication until Sunday or if your blood pressure is running High. Return to the emergency department if you are continue to have low blood pressure or follow-up with your primary care physician. Get help right away if: You have chest pain. You feel extremely weak or you faint. You see blood in your vomit. Your vomit looks like coffee grounds. You have bloody or black stools or stools that look like tar. You have a severe headache, a stiff neck, or both. You have a rash. You have severe pain, cramping, or bloating in your abdomen. You have trouble breathing or you are breathing very quickly. Your heart is beating very quickly. Your skin feels cold and clammy. You feel confused. You have pain when you urinate. You have signs of dehydration, such as: Dark urine, very little urine, or no urine. Cracked lips. Dry mouth. Sunken eyes. Sleepiness. Weakness.

## 2018-06-02 NOTE — ED Notes (Signed)
Patient tolerating PO fluids well at this time 

## 2018-06-02 NOTE — ED Provider Notes (Signed)
Here for N/V/D  Waiting for C-diff pcr Benign abdominal exam If C-diff negative, home withy sxs trx  Patient continues to have hypotension despite the fact that he is normally hypertensive.  Discussed the case with Dr. Audelia Hives will obtain a CT scan.   Patient CT scan shows no acute abnormalities.  Patient's blood pressures have improved.  He still below the threshold to need to take his antihypertensives he can should continue to fluid rehydrate.  He has had no active vomiting or diarrhea here.  Labs are otherwise benign and will discharge patient at this point.    Margarita Mail, PA-C 06/06/18 2337    Drenda Freeze, MD 06/08/18 1501

## 2018-06-02 NOTE — ED Notes (Signed)
Patient given PO fluids and encouraged to drink.  

## 2018-06-02 NOTE — ED Triage Notes (Signed)
Patient BIB [facility] for vomiting and diarrhea since 0100 this morning. Patient reports 4 episodes of diarrhea. Given 4mg  Zofran by EMS. Patient also c/o abdominal pain. Denies chest pain, cough, SOB, fever, or dysuria.    BP 130/90 HR 64 RR 16 100%  CBG 175

## 2018-06-02 NOTE — ED Notes (Signed)
Patient transported to CT 

## 2018-06-06 DIAGNOSIS — E1159 Type 2 diabetes mellitus with other circulatory complications: Secondary | ICD-10-CM | POA: Diagnosis not present

## 2018-06-06 DIAGNOSIS — I639 Cerebral infarction, unspecified: Secondary | ICD-10-CM | POA: Diagnosis not present

## 2018-06-06 DIAGNOSIS — H5440 Blindness, one eye, unspecified eye: Secondary | ICD-10-CM | POA: Diagnosis not present

## 2018-06-07 DIAGNOSIS — I639 Cerebral infarction, unspecified: Secondary | ICD-10-CM | POA: Diagnosis not present

## 2018-06-07 DIAGNOSIS — E1159 Type 2 diabetes mellitus with other circulatory complications: Secondary | ICD-10-CM | POA: Diagnosis not present

## 2018-06-07 DIAGNOSIS — H5440 Blindness, one eye, unspecified eye: Secondary | ICD-10-CM | POA: Diagnosis not present

## 2018-06-08 DIAGNOSIS — H5440 Blindness, one eye, unspecified eye: Secondary | ICD-10-CM | POA: Diagnosis not present

## 2018-06-08 DIAGNOSIS — E1159 Type 2 diabetes mellitus with other circulatory complications: Secondary | ICD-10-CM | POA: Diagnosis not present

## 2018-06-08 DIAGNOSIS — I639 Cerebral infarction, unspecified: Secondary | ICD-10-CM | POA: Diagnosis not present

## 2018-06-09 DIAGNOSIS — H5440 Blindness, one eye, unspecified eye: Secondary | ICD-10-CM | POA: Diagnosis not present

## 2018-06-09 DIAGNOSIS — I639 Cerebral infarction, unspecified: Secondary | ICD-10-CM | POA: Diagnosis not present

## 2018-06-09 DIAGNOSIS — E1159 Type 2 diabetes mellitus with other circulatory complications: Secondary | ICD-10-CM | POA: Diagnosis not present

## 2018-06-10 DIAGNOSIS — E1159 Type 2 diabetes mellitus with other circulatory complications: Secondary | ICD-10-CM | POA: Diagnosis not present

## 2018-06-10 DIAGNOSIS — H5440 Blindness, one eye, unspecified eye: Secondary | ICD-10-CM | POA: Diagnosis not present

## 2018-06-10 DIAGNOSIS — I639 Cerebral infarction, unspecified: Secondary | ICD-10-CM | POA: Diagnosis not present

## 2018-06-11 DIAGNOSIS — E1159 Type 2 diabetes mellitus with other circulatory complications: Secondary | ICD-10-CM | POA: Diagnosis not present

## 2018-06-11 DIAGNOSIS — H5440 Blindness, one eye, unspecified eye: Secondary | ICD-10-CM | POA: Diagnosis not present

## 2018-06-11 DIAGNOSIS — I639 Cerebral infarction, unspecified: Secondary | ICD-10-CM | POA: Diagnosis not present

## 2018-06-12 DIAGNOSIS — H5440 Blindness, one eye, unspecified eye: Secondary | ICD-10-CM | POA: Diagnosis not present

## 2018-06-12 DIAGNOSIS — E1159 Type 2 diabetes mellitus with other circulatory complications: Secondary | ICD-10-CM | POA: Diagnosis not present

## 2018-06-12 DIAGNOSIS — I639 Cerebral infarction, unspecified: Secondary | ICD-10-CM | POA: Diagnosis not present

## 2018-06-13 DIAGNOSIS — I639 Cerebral infarction, unspecified: Secondary | ICD-10-CM | POA: Diagnosis not present

## 2018-06-13 DIAGNOSIS — E1159 Type 2 diabetes mellitus with other circulatory complications: Secondary | ICD-10-CM | POA: Diagnosis not present

## 2018-06-13 DIAGNOSIS — H5440 Blindness, one eye, unspecified eye: Secondary | ICD-10-CM | POA: Diagnosis not present

## 2018-06-14 DIAGNOSIS — I639 Cerebral infarction, unspecified: Secondary | ICD-10-CM | POA: Diagnosis not present

## 2018-06-14 DIAGNOSIS — H5440 Blindness, one eye, unspecified eye: Secondary | ICD-10-CM | POA: Diagnosis not present

## 2018-06-14 DIAGNOSIS — E1159 Type 2 diabetes mellitus with other circulatory complications: Secondary | ICD-10-CM | POA: Diagnosis not present

## 2018-06-15 DIAGNOSIS — H5440 Blindness, one eye, unspecified eye: Secondary | ICD-10-CM | POA: Diagnosis not present

## 2018-06-15 DIAGNOSIS — I639 Cerebral infarction, unspecified: Secondary | ICD-10-CM | POA: Diagnosis not present

## 2018-06-15 DIAGNOSIS — E1159 Type 2 diabetes mellitus with other circulatory complications: Secondary | ICD-10-CM | POA: Diagnosis not present

## 2018-06-16 DIAGNOSIS — I639 Cerebral infarction, unspecified: Secondary | ICD-10-CM | POA: Diagnosis not present

## 2018-06-16 DIAGNOSIS — H5440 Blindness, one eye, unspecified eye: Secondary | ICD-10-CM | POA: Diagnosis not present

## 2018-06-16 DIAGNOSIS — E1159 Type 2 diabetes mellitus with other circulatory complications: Secondary | ICD-10-CM | POA: Diagnosis not present

## 2018-06-17 DIAGNOSIS — H5440 Blindness, one eye, unspecified eye: Secondary | ICD-10-CM | POA: Diagnosis not present

## 2018-06-17 DIAGNOSIS — I639 Cerebral infarction, unspecified: Secondary | ICD-10-CM | POA: Diagnosis not present

## 2018-06-17 DIAGNOSIS — E1159 Type 2 diabetes mellitus with other circulatory complications: Secondary | ICD-10-CM | POA: Diagnosis not present

## 2018-06-18 DIAGNOSIS — H5440 Blindness, one eye, unspecified eye: Secondary | ICD-10-CM | POA: Diagnosis not present

## 2018-06-18 DIAGNOSIS — E1159 Type 2 diabetes mellitus with other circulatory complications: Secondary | ICD-10-CM | POA: Diagnosis not present

## 2018-06-18 DIAGNOSIS — I639 Cerebral infarction, unspecified: Secondary | ICD-10-CM | POA: Diagnosis not present

## 2018-06-19 DIAGNOSIS — E1159 Type 2 diabetes mellitus with other circulatory complications: Secondary | ICD-10-CM | POA: Diagnosis not present

## 2018-06-19 DIAGNOSIS — H5440 Blindness, one eye, unspecified eye: Secondary | ICD-10-CM | POA: Diagnosis not present

## 2018-06-19 DIAGNOSIS — I639 Cerebral infarction, unspecified: Secondary | ICD-10-CM | POA: Diagnosis not present

## 2018-06-20 DIAGNOSIS — I639 Cerebral infarction, unspecified: Secondary | ICD-10-CM | POA: Diagnosis not present

## 2018-06-20 DIAGNOSIS — E1159 Type 2 diabetes mellitus with other circulatory complications: Secondary | ICD-10-CM | POA: Diagnosis not present

## 2018-06-20 DIAGNOSIS — H5440 Blindness, one eye, unspecified eye: Secondary | ICD-10-CM | POA: Diagnosis not present

## 2018-06-21 DIAGNOSIS — H5440 Blindness, one eye, unspecified eye: Secondary | ICD-10-CM | POA: Diagnosis not present

## 2018-06-21 DIAGNOSIS — I639 Cerebral infarction, unspecified: Secondary | ICD-10-CM | POA: Diagnosis not present

## 2018-06-21 DIAGNOSIS — E1159 Type 2 diabetes mellitus with other circulatory complications: Secondary | ICD-10-CM | POA: Diagnosis not present

## 2018-06-22 DIAGNOSIS — I639 Cerebral infarction, unspecified: Secondary | ICD-10-CM | POA: Diagnosis not present

## 2018-06-22 DIAGNOSIS — H5440 Blindness, one eye, unspecified eye: Secondary | ICD-10-CM | POA: Diagnosis not present

## 2018-06-22 DIAGNOSIS — E1159 Type 2 diabetes mellitus with other circulatory complications: Secondary | ICD-10-CM | POA: Diagnosis not present

## 2018-06-23 DIAGNOSIS — I639 Cerebral infarction, unspecified: Secondary | ICD-10-CM | POA: Diagnosis not present

## 2018-06-23 DIAGNOSIS — H5440 Blindness, one eye, unspecified eye: Secondary | ICD-10-CM | POA: Diagnosis not present

## 2018-06-23 DIAGNOSIS — E1159 Type 2 diabetes mellitus with other circulatory complications: Secondary | ICD-10-CM | POA: Diagnosis not present

## 2018-06-24 DIAGNOSIS — E1159 Type 2 diabetes mellitus with other circulatory complications: Secondary | ICD-10-CM | POA: Diagnosis not present

## 2018-06-24 DIAGNOSIS — H5440 Blindness, one eye, unspecified eye: Secondary | ICD-10-CM | POA: Diagnosis not present

## 2018-06-24 DIAGNOSIS — I639 Cerebral infarction, unspecified: Secondary | ICD-10-CM | POA: Diagnosis not present

## 2018-06-25 DIAGNOSIS — H5440 Blindness, one eye, unspecified eye: Secondary | ICD-10-CM | POA: Diagnosis not present

## 2018-06-25 DIAGNOSIS — I639 Cerebral infarction, unspecified: Secondary | ICD-10-CM | POA: Diagnosis not present

## 2018-06-25 DIAGNOSIS — E1159 Type 2 diabetes mellitus with other circulatory complications: Secondary | ICD-10-CM | POA: Diagnosis not present

## 2018-06-26 DIAGNOSIS — I639 Cerebral infarction, unspecified: Secondary | ICD-10-CM | POA: Diagnosis not present

## 2018-06-26 DIAGNOSIS — E1159 Type 2 diabetes mellitus with other circulatory complications: Secondary | ICD-10-CM | POA: Diagnosis not present

## 2018-06-26 DIAGNOSIS — H5440 Blindness, one eye, unspecified eye: Secondary | ICD-10-CM | POA: Diagnosis not present

## 2018-06-27 DIAGNOSIS — E1159 Type 2 diabetes mellitus with other circulatory complications: Secondary | ICD-10-CM | POA: Diagnosis not present

## 2018-06-27 DIAGNOSIS — H5440 Blindness, one eye, unspecified eye: Secondary | ICD-10-CM | POA: Diagnosis not present

## 2018-06-27 DIAGNOSIS — I639 Cerebral infarction, unspecified: Secondary | ICD-10-CM | POA: Diagnosis not present

## 2018-06-28 DIAGNOSIS — I639 Cerebral infarction, unspecified: Secondary | ICD-10-CM | POA: Diagnosis not present

## 2018-06-28 DIAGNOSIS — E1159 Type 2 diabetes mellitus with other circulatory complications: Secondary | ICD-10-CM | POA: Diagnosis not present

## 2018-06-28 DIAGNOSIS — H5440 Blindness, one eye, unspecified eye: Secondary | ICD-10-CM | POA: Diagnosis not present

## 2018-06-29 DIAGNOSIS — E1159 Type 2 diabetes mellitus with other circulatory complications: Secondary | ICD-10-CM | POA: Diagnosis not present

## 2018-06-29 DIAGNOSIS — I639 Cerebral infarction, unspecified: Secondary | ICD-10-CM | POA: Diagnosis not present

## 2018-06-29 DIAGNOSIS — H5440 Blindness, one eye, unspecified eye: Secondary | ICD-10-CM | POA: Diagnosis not present

## 2018-06-30 DIAGNOSIS — H5440 Blindness, one eye, unspecified eye: Secondary | ICD-10-CM | POA: Diagnosis not present

## 2018-06-30 DIAGNOSIS — E1159 Type 2 diabetes mellitus with other circulatory complications: Secondary | ICD-10-CM | POA: Diagnosis not present

## 2018-06-30 DIAGNOSIS — I639 Cerebral infarction, unspecified: Secondary | ICD-10-CM | POA: Diagnosis not present

## 2018-07-01 DIAGNOSIS — I639 Cerebral infarction, unspecified: Secondary | ICD-10-CM | POA: Diagnosis not present

## 2018-07-01 DIAGNOSIS — H5440 Blindness, one eye, unspecified eye: Secondary | ICD-10-CM | POA: Diagnosis not present

## 2018-07-01 DIAGNOSIS — E1159 Type 2 diabetes mellitus with other circulatory complications: Secondary | ICD-10-CM | POA: Diagnosis not present

## 2018-07-02 DIAGNOSIS — E1159 Type 2 diabetes mellitus with other circulatory complications: Secondary | ICD-10-CM | POA: Diagnosis not present

## 2018-07-02 DIAGNOSIS — I639 Cerebral infarction, unspecified: Secondary | ICD-10-CM | POA: Diagnosis not present

## 2018-07-02 DIAGNOSIS — H5440 Blindness, one eye, unspecified eye: Secondary | ICD-10-CM | POA: Diagnosis not present

## 2018-07-03 DIAGNOSIS — E1159 Type 2 diabetes mellitus with other circulatory complications: Secondary | ICD-10-CM | POA: Diagnosis not present

## 2018-07-03 DIAGNOSIS — H5440 Blindness, one eye, unspecified eye: Secondary | ICD-10-CM | POA: Diagnosis not present

## 2018-07-03 DIAGNOSIS — I639 Cerebral infarction, unspecified: Secondary | ICD-10-CM | POA: Diagnosis not present

## 2018-07-04 DIAGNOSIS — E1159 Type 2 diabetes mellitus with other circulatory complications: Secondary | ICD-10-CM | POA: Diagnosis not present

## 2018-07-04 DIAGNOSIS — I639 Cerebral infarction, unspecified: Secondary | ICD-10-CM | POA: Diagnosis not present

## 2018-07-04 DIAGNOSIS — H5440 Blindness, one eye, unspecified eye: Secondary | ICD-10-CM | POA: Diagnosis not present

## 2018-07-05 DIAGNOSIS — E1159 Type 2 diabetes mellitus with other circulatory complications: Secondary | ICD-10-CM | POA: Diagnosis not present

## 2018-07-05 DIAGNOSIS — I639 Cerebral infarction, unspecified: Secondary | ICD-10-CM | POA: Diagnosis not present

## 2018-07-05 DIAGNOSIS — H5440 Blindness, one eye, unspecified eye: Secondary | ICD-10-CM | POA: Diagnosis not present

## 2018-07-06 DIAGNOSIS — E1159 Type 2 diabetes mellitus with other circulatory complications: Secondary | ICD-10-CM | POA: Diagnosis not present

## 2018-07-06 DIAGNOSIS — H5440 Blindness, one eye, unspecified eye: Secondary | ICD-10-CM | POA: Diagnosis not present

## 2018-07-06 DIAGNOSIS — I639 Cerebral infarction, unspecified: Secondary | ICD-10-CM | POA: Diagnosis not present

## 2018-07-07 DIAGNOSIS — E1159 Type 2 diabetes mellitus with other circulatory complications: Secondary | ICD-10-CM | POA: Diagnosis not present

## 2018-07-07 DIAGNOSIS — H5440 Blindness, one eye, unspecified eye: Secondary | ICD-10-CM | POA: Diagnosis not present

## 2018-07-07 DIAGNOSIS — I639 Cerebral infarction, unspecified: Secondary | ICD-10-CM | POA: Diagnosis not present

## 2018-07-08 DIAGNOSIS — H5440 Blindness, one eye, unspecified eye: Secondary | ICD-10-CM | POA: Diagnosis not present

## 2018-07-08 DIAGNOSIS — I639 Cerebral infarction, unspecified: Secondary | ICD-10-CM | POA: Diagnosis not present

## 2018-07-08 DIAGNOSIS — E1159 Type 2 diabetes mellitus with other circulatory complications: Secondary | ICD-10-CM | POA: Diagnosis not present

## 2018-07-09 DIAGNOSIS — I639 Cerebral infarction, unspecified: Secondary | ICD-10-CM | POA: Diagnosis not present

## 2018-07-09 DIAGNOSIS — H5440 Blindness, one eye, unspecified eye: Secondary | ICD-10-CM | POA: Diagnosis not present

## 2018-07-09 DIAGNOSIS — E1159 Type 2 diabetes mellitus with other circulatory complications: Secondary | ICD-10-CM | POA: Diagnosis not present

## 2018-07-10 DIAGNOSIS — I639 Cerebral infarction, unspecified: Secondary | ICD-10-CM | POA: Diagnosis not present

## 2018-07-10 DIAGNOSIS — H5440 Blindness, one eye, unspecified eye: Secondary | ICD-10-CM | POA: Diagnosis not present

## 2018-07-10 DIAGNOSIS — E1159 Type 2 diabetes mellitus with other circulatory complications: Secondary | ICD-10-CM | POA: Diagnosis not present

## 2018-07-11 DIAGNOSIS — H5440 Blindness, one eye, unspecified eye: Secondary | ICD-10-CM | POA: Diagnosis not present

## 2018-07-11 DIAGNOSIS — E1159 Type 2 diabetes mellitus with other circulatory complications: Secondary | ICD-10-CM | POA: Diagnosis not present

## 2018-07-11 DIAGNOSIS — I639 Cerebral infarction, unspecified: Secondary | ICD-10-CM | POA: Diagnosis not present

## 2018-07-12 ENCOUNTER — Telehealth: Payer: Self-pay

## 2018-07-12 DIAGNOSIS — I10 Essential (primary) hypertension: Secondary | ICD-10-CM

## 2018-07-12 DIAGNOSIS — E118 Type 2 diabetes mellitus with unspecified complications: Secondary | ICD-10-CM

## 2018-07-12 DIAGNOSIS — E1165 Type 2 diabetes mellitus with hyperglycemia: Secondary | ICD-10-CM

## 2018-07-12 DIAGNOSIS — I639 Cerebral infarction, unspecified: Secondary | ICD-10-CM | POA: Diagnosis not present

## 2018-07-12 DIAGNOSIS — Z794 Long term (current) use of insulin: Secondary | ICD-10-CM

## 2018-07-12 DIAGNOSIS — E1159 Type 2 diabetes mellitus with other circulatory complications: Secondary | ICD-10-CM | POA: Diagnosis not present

## 2018-07-12 DIAGNOSIS — IMO0002 Reserved for concepts with insufficient information to code with codable children: Secondary | ICD-10-CM

## 2018-07-12 DIAGNOSIS — H5440 Blindness, one eye, unspecified eye: Secondary | ICD-10-CM | POA: Diagnosis not present

## 2018-07-12 MED ORDER — METFORMIN HCL 500 MG PO TABS: 500.0000 mg | ORAL_TABLET | Freq: Two times a day (BID) | ORAL | 2 refills | Status: DC

## 2018-07-12 MED ORDER — LISINOPRIL 20 MG PO TABS: 20.0000 mg | ORAL_TABLET | Freq: Every day | ORAL | 2 refills | Status: DC

## 2018-07-12 NOTE — Telephone Encounter (Signed)
Medication sent to pharmacy  

## 2018-07-13 DIAGNOSIS — H5440 Blindness, one eye, unspecified eye: Secondary | ICD-10-CM | POA: Diagnosis not present

## 2018-07-13 DIAGNOSIS — I639 Cerebral infarction, unspecified: Secondary | ICD-10-CM | POA: Diagnosis not present

## 2018-07-13 DIAGNOSIS — E1159 Type 2 diabetes mellitus with other circulatory complications: Secondary | ICD-10-CM | POA: Diagnosis not present

## 2018-07-13 MED FILL — metFORMIN HCL 500 MG TABS: 500 | 30 days supply | Qty: 60 | Fill #0

## 2018-07-13 MED FILL — LISINOPRIL 20 MG TAB: 20 | 30 days supply | Qty: 30 | Fill #0

## 2018-07-14 DIAGNOSIS — I639 Cerebral infarction, unspecified: Secondary | ICD-10-CM | POA: Diagnosis not present

## 2018-07-14 DIAGNOSIS — E1159 Type 2 diabetes mellitus with other circulatory complications: Secondary | ICD-10-CM | POA: Diagnosis not present

## 2018-07-14 DIAGNOSIS — H5440 Blindness, one eye, unspecified eye: Secondary | ICD-10-CM | POA: Diagnosis not present

## 2018-07-15 ENCOUNTER — Telehealth: Payer: Self-pay

## 2018-07-15 DIAGNOSIS — E1159 Type 2 diabetes mellitus with other circulatory complications: Secondary | ICD-10-CM | POA: Diagnosis not present

## 2018-07-15 DIAGNOSIS — E1165 Type 2 diabetes mellitus with hyperglycemia: Secondary | ICD-10-CM

## 2018-07-15 DIAGNOSIS — IMO0002 Reserved for concepts with insufficient information to code with codable children: Secondary | ICD-10-CM

## 2018-07-15 DIAGNOSIS — H5440 Blindness, one eye, unspecified eye: Secondary | ICD-10-CM | POA: Diagnosis not present

## 2018-07-15 DIAGNOSIS — E118 Type 2 diabetes mellitus with unspecified complications: Principal | ICD-10-CM

## 2018-07-15 DIAGNOSIS — I639 Cerebral infarction, unspecified: Secondary | ICD-10-CM | POA: Diagnosis not present

## 2018-07-15 DIAGNOSIS — Z794 Long term (current) use of insulin: Principal | ICD-10-CM

## 2018-07-15 MED ORDER — INSULIN GLARGINE 100 UNIT/ML ~~LOC~~ SOLN: SUBCUTANEOUS | 11 refills | Status: DC

## 2018-07-15 MED FILL — LANTUS 100 UNITS/ML VIAL: 100 | 30 days supply | Qty: 10 | Fill #0

## 2018-07-15 NOTE — Telephone Encounter (Signed)
Medication sent to pharmacy  

## 2018-07-16 DIAGNOSIS — I639 Cerebral infarction, unspecified: Secondary | ICD-10-CM | POA: Diagnosis not present

## 2018-07-16 DIAGNOSIS — H5440 Blindness, one eye, unspecified eye: Secondary | ICD-10-CM | POA: Diagnosis not present

## 2018-07-16 DIAGNOSIS — E1159 Type 2 diabetes mellitus with other circulatory complications: Secondary | ICD-10-CM | POA: Diagnosis not present

## 2018-07-17 DIAGNOSIS — H5440 Blindness, one eye, unspecified eye: Secondary | ICD-10-CM | POA: Diagnosis not present

## 2018-07-17 DIAGNOSIS — I639 Cerebral infarction, unspecified: Secondary | ICD-10-CM | POA: Diagnosis not present

## 2018-07-17 DIAGNOSIS — E1159 Type 2 diabetes mellitus with other circulatory complications: Secondary | ICD-10-CM | POA: Diagnosis not present

## 2018-07-18 DIAGNOSIS — E1159 Type 2 diabetes mellitus with other circulatory complications: Secondary | ICD-10-CM | POA: Diagnosis not present

## 2018-07-18 DIAGNOSIS — H5440 Blindness, one eye, unspecified eye: Secondary | ICD-10-CM | POA: Diagnosis not present

## 2018-07-18 DIAGNOSIS — I639 Cerebral infarction, unspecified: Secondary | ICD-10-CM | POA: Diagnosis not present

## 2018-07-19 DIAGNOSIS — E1159 Type 2 diabetes mellitus with other circulatory complications: Secondary | ICD-10-CM | POA: Diagnosis not present

## 2018-07-19 DIAGNOSIS — H5440 Blindness, one eye, unspecified eye: Secondary | ICD-10-CM | POA: Diagnosis not present

## 2018-07-19 DIAGNOSIS — I639 Cerebral infarction, unspecified: Secondary | ICD-10-CM | POA: Diagnosis not present

## 2018-07-20 ENCOUNTER — Telehealth: Payer: Self-pay

## 2018-07-20 DIAGNOSIS — I639 Cerebral infarction, unspecified: Secondary | ICD-10-CM | POA: Diagnosis not present

## 2018-07-20 DIAGNOSIS — Z794 Long term (current) use of insulin: Principal | ICD-10-CM

## 2018-07-20 DIAGNOSIS — IMO0002 Reserved for concepts with insufficient information to code with codable children: Secondary | ICD-10-CM

## 2018-07-20 DIAGNOSIS — E118 Type 2 diabetes mellitus with unspecified complications: Secondary | ICD-10-CM

## 2018-07-20 DIAGNOSIS — E1159 Type 2 diabetes mellitus with other circulatory complications: Secondary | ICD-10-CM | POA: Diagnosis not present

## 2018-07-20 DIAGNOSIS — H5440 Blindness, one eye, unspecified eye: Secondary | ICD-10-CM | POA: Diagnosis not present

## 2018-07-20 DIAGNOSIS — E1165 Type 2 diabetes mellitus with hyperglycemia: Secondary | ICD-10-CM

## 2018-07-20 MED ORDER — GLUCOSE BLOOD VI STRP: ORAL_STRIP | 12 refills | Status: DC

## 2018-07-20 MED ORDER — "INSULIN SYRINGE-NEEDLE U-100 31G X 5/16"" 1 ML MISC": 3 refills | Status: DC

## 2018-07-20 NOTE — Telephone Encounter (Signed)
Refill sent in to pharmacy 

## 2018-07-20 NOTE — Telephone Encounter (Signed)
Ted Mcalpine D, NT  You 5 minutes ago (10:23 AM)   Pt is requesting a medication refill on his needles and test strips. Thanks!   Routing comment

## 2018-07-21 DIAGNOSIS — I639 Cerebral infarction, unspecified: Secondary | ICD-10-CM | POA: Diagnosis not present

## 2018-07-21 DIAGNOSIS — E1159 Type 2 diabetes mellitus with other circulatory complications: Secondary | ICD-10-CM | POA: Diagnosis not present

## 2018-07-21 DIAGNOSIS — H5440 Blindness, one eye, unspecified eye: Secondary | ICD-10-CM | POA: Diagnosis not present

## 2018-07-21 MED FILL — ACCU-CHEK GUIDE W/DEVICE KI: W/DEVICE | 1 days supply | Qty: 1 | Fill #0

## 2018-07-21 MED FILL — TRUEPLUS SYR 1ML 31GX5/16: 31G X 5/16" | 25 days supply | Qty: 100 | Fill #0

## 2018-07-21 MED FILL — TRUEPLUS SYR 1ML 31GX5/16": 31G X 5/16" | 25 days supply | Qty: 100 | Fill #0

## 2018-07-21 MED FILL — ACCU-CHEK GUIDE TEST STRIP: 25 days supply | Qty: 100 | Fill #0

## 2018-07-22 DIAGNOSIS — I639 Cerebral infarction, unspecified: Secondary | ICD-10-CM | POA: Diagnosis not present

## 2018-07-22 DIAGNOSIS — E1159 Type 2 diabetes mellitus with other circulatory complications: Secondary | ICD-10-CM | POA: Diagnosis not present

## 2018-07-22 DIAGNOSIS — H5440 Blindness, one eye, unspecified eye: Secondary | ICD-10-CM | POA: Diagnosis not present

## 2018-07-23 DIAGNOSIS — E1159 Type 2 diabetes mellitus with other circulatory complications: Secondary | ICD-10-CM | POA: Diagnosis not present

## 2018-07-23 DIAGNOSIS — H5440 Blindness, one eye, unspecified eye: Secondary | ICD-10-CM | POA: Diagnosis not present

## 2018-07-23 DIAGNOSIS — I639 Cerebral infarction, unspecified: Secondary | ICD-10-CM | POA: Diagnosis not present

## 2018-07-24 DIAGNOSIS — E1159 Type 2 diabetes mellitus with other circulatory complications: Secondary | ICD-10-CM | POA: Diagnosis not present

## 2018-07-24 DIAGNOSIS — I639 Cerebral infarction, unspecified: Secondary | ICD-10-CM | POA: Diagnosis not present

## 2018-07-24 DIAGNOSIS — H5440 Blindness, one eye, unspecified eye: Secondary | ICD-10-CM | POA: Diagnosis not present

## 2018-07-25 DIAGNOSIS — H5440 Blindness, one eye, unspecified eye: Secondary | ICD-10-CM | POA: Diagnosis not present

## 2018-07-25 DIAGNOSIS — I639 Cerebral infarction, unspecified: Secondary | ICD-10-CM | POA: Diagnosis not present

## 2018-07-25 DIAGNOSIS — E1159 Type 2 diabetes mellitus with other circulatory complications: Secondary | ICD-10-CM | POA: Diagnosis not present

## 2018-07-26 DIAGNOSIS — I639 Cerebral infarction, unspecified: Secondary | ICD-10-CM | POA: Diagnosis not present

## 2018-07-26 DIAGNOSIS — H5440 Blindness, one eye, unspecified eye: Secondary | ICD-10-CM | POA: Diagnosis not present

## 2018-07-26 DIAGNOSIS — E1159 Type 2 diabetes mellitus with other circulatory complications: Secondary | ICD-10-CM | POA: Diagnosis not present

## 2018-07-27 DIAGNOSIS — H5440 Blindness, one eye, unspecified eye: Secondary | ICD-10-CM | POA: Diagnosis not present

## 2018-07-27 DIAGNOSIS — I639 Cerebral infarction, unspecified: Secondary | ICD-10-CM | POA: Diagnosis not present

## 2018-07-27 DIAGNOSIS — E1159 Type 2 diabetes mellitus with other circulatory complications: Secondary | ICD-10-CM | POA: Diagnosis not present

## 2018-07-28 DIAGNOSIS — H5440 Blindness, one eye, unspecified eye: Secondary | ICD-10-CM | POA: Diagnosis not present

## 2018-07-28 DIAGNOSIS — I639 Cerebral infarction, unspecified: Secondary | ICD-10-CM | POA: Diagnosis not present

## 2018-07-28 DIAGNOSIS — E1159 Type 2 diabetes mellitus with other circulatory complications: Secondary | ICD-10-CM | POA: Diagnosis not present

## 2018-07-29 DIAGNOSIS — H5440 Blindness, one eye, unspecified eye: Secondary | ICD-10-CM | POA: Diagnosis not present

## 2018-07-29 DIAGNOSIS — E1159 Type 2 diabetes mellitus with other circulatory complications: Secondary | ICD-10-CM | POA: Diagnosis not present

## 2018-07-29 DIAGNOSIS — I639 Cerebral infarction, unspecified: Secondary | ICD-10-CM | POA: Diagnosis not present

## 2018-07-30 DIAGNOSIS — I639 Cerebral infarction, unspecified: Secondary | ICD-10-CM | POA: Diagnosis not present

## 2018-07-30 DIAGNOSIS — H5440 Blindness, one eye, unspecified eye: Secondary | ICD-10-CM | POA: Diagnosis not present

## 2018-07-30 DIAGNOSIS — E1159 Type 2 diabetes mellitus with other circulatory complications: Secondary | ICD-10-CM | POA: Diagnosis not present

## 2018-07-31 DIAGNOSIS — E1159 Type 2 diabetes mellitus with other circulatory complications: Secondary | ICD-10-CM | POA: Diagnosis not present

## 2018-07-31 DIAGNOSIS — I639 Cerebral infarction, unspecified: Secondary | ICD-10-CM | POA: Diagnosis not present

## 2018-07-31 DIAGNOSIS — H5440 Blindness, one eye, unspecified eye: Secondary | ICD-10-CM | POA: Diagnosis not present

## 2018-08-01 DIAGNOSIS — I639 Cerebral infarction, unspecified: Secondary | ICD-10-CM | POA: Diagnosis not present

## 2018-08-01 DIAGNOSIS — H5440 Blindness, one eye, unspecified eye: Secondary | ICD-10-CM | POA: Diagnosis not present

## 2018-08-01 DIAGNOSIS — E1159 Type 2 diabetes mellitus with other circulatory complications: Secondary | ICD-10-CM | POA: Diagnosis not present

## 2018-08-02 DIAGNOSIS — E1159 Type 2 diabetes mellitus with other circulatory complications: Secondary | ICD-10-CM | POA: Diagnosis not present

## 2018-08-02 DIAGNOSIS — H5440 Blindness, one eye, unspecified eye: Secondary | ICD-10-CM | POA: Diagnosis not present

## 2018-08-02 DIAGNOSIS — I639 Cerebral infarction, unspecified: Secondary | ICD-10-CM | POA: Diagnosis not present

## 2018-08-03 ENCOUNTER — Telehealth: Payer: Self-pay

## 2018-08-03 DIAGNOSIS — I639 Cerebral infarction, unspecified: Secondary | ICD-10-CM | POA: Diagnosis not present

## 2018-08-03 DIAGNOSIS — E1159 Type 2 diabetes mellitus with other circulatory complications: Secondary | ICD-10-CM

## 2018-08-03 DIAGNOSIS — H5440 Blindness, one eye, unspecified eye: Secondary | ICD-10-CM | POA: Diagnosis not present

## 2018-08-03 DIAGNOSIS — Z794 Long term (current) use of insulin: Secondary | ICD-10-CM

## 2018-08-04 DIAGNOSIS — I639 Cerebral infarction, unspecified: Secondary | ICD-10-CM | POA: Diagnosis not present

## 2018-08-04 DIAGNOSIS — H5440 Blindness, one eye, unspecified eye: Secondary | ICD-10-CM | POA: Diagnosis not present

## 2018-08-04 DIAGNOSIS — E1159 Type 2 diabetes mellitus with other circulatory complications: Secondary | ICD-10-CM | POA: Diagnosis not present

## 2018-08-04 NOTE — Telephone Encounter (Signed)
Patient notified that referral was sent.

## 2018-08-05 DIAGNOSIS — I639 Cerebral infarction, unspecified: Secondary | ICD-10-CM | POA: Diagnosis not present

## 2018-08-05 DIAGNOSIS — E1159 Type 2 diabetes mellitus with other circulatory complications: Secondary | ICD-10-CM | POA: Diagnosis not present

## 2018-08-05 DIAGNOSIS — H5440 Blindness, one eye, unspecified eye: Secondary | ICD-10-CM | POA: Diagnosis not present

## 2018-08-06 DIAGNOSIS — H5440 Blindness, one eye, unspecified eye: Secondary | ICD-10-CM | POA: Diagnosis not present

## 2018-08-06 DIAGNOSIS — E1159 Type 2 diabetes mellitus with other circulatory complications: Secondary | ICD-10-CM | POA: Diagnosis not present

## 2018-08-06 DIAGNOSIS — I639 Cerebral infarction, unspecified: Secondary | ICD-10-CM | POA: Diagnosis not present

## 2018-08-07 DIAGNOSIS — I639 Cerebral infarction, unspecified: Secondary | ICD-10-CM | POA: Diagnosis not present

## 2018-08-07 DIAGNOSIS — E1159 Type 2 diabetes mellitus with other circulatory complications: Secondary | ICD-10-CM | POA: Diagnosis not present

## 2018-08-07 DIAGNOSIS — H5440 Blindness, one eye, unspecified eye: Secondary | ICD-10-CM | POA: Diagnosis not present

## 2018-08-08 ENCOUNTER — Ambulatory Visit: Payer: Medicaid Other | Admitting: Family Medicine

## 2018-08-08 DIAGNOSIS — E1159 Type 2 diabetes mellitus with other circulatory complications: Secondary | ICD-10-CM | POA: Diagnosis not present

## 2018-08-08 DIAGNOSIS — H5440 Blindness, one eye, unspecified eye: Secondary | ICD-10-CM | POA: Diagnosis not present

## 2018-08-08 DIAGNOSIS — I639 Cerebral infarction, unspecified: Secondary | ICD-10-CM | POA: Diagnosis not present

## 2018-08-09 DIAGNOSIS — I639 Cerebral infarction, unspecified: Secondary | ICD-10-CM | POA: Diagnosis not present

## 2018-08-09 DIAGNOSIS — H5440 Blindness, one eye, unspecified eye: Secondary | ICD-10-CM | POA: Diagnosis not present

## 2018-08-09 DIAGNOSIS — E1159 Type 2 diabetes mellitus with other circulatory complications: Secondary | ICD-10-CM | POA: Diagnosis not present

## 2018-08-10 DIAGNOSIS — H5440 Blindness, one eye, unspecified eye: Secondary | ICD-10-CM | POA: Diagnosis not present

## 2018-08-10 DIAGNOSIS — E1159 Type 2 diabetes mellitus with other circulatory complications: Secondary | ICD-10-CM | POA: Diagnosis not present

## 2018-08-10 DIAGNOSIS — I639 Cerebral infarction, unspecified: Secondary | ICD-10-CM | POA: Diagnosis not present

## 2018-08-11 DIAGNOSIS — I639 Cerebral infarction, unspecified: Secondary | ICD-10-CM | POA: Diagnosis not present

## 2018-08-11 DIAGNOSIS — E1159 Type 2 diabetes mellitus with other circulatory complications: Secondary | ICD-10-CM | POA: Diagnosis not present

## 2018-08-11 DIAGNOSIS — H5440 Blindness, one eye, unspecified eye: Secondary | ICD-10-CM | POA: Diagnosis not present

## 2018-08-12 DIAGNOSIS — I639 Cerebral infarction, unspecified: Secondary | ICD-10-CM | POA: Diagnosis not present

## 2018-08-12 DIAGNOSIS — E1159 Type 2 diabetes mellitus with other circulatory complications: Secondary | ICD-10-CM | POA: Diagnosis not present

## 2018-08-12 DIAGNOSIS — H5440 Blindness, one eye, unspecified eye: Secondary | ICD-10-CM | POA: Diagnosis not present

## 2018-08-13 DIAGNOSIS — H5440 Blindness, one eye, unspecified eye: Secondary | ICD-10-CM | POA: Diagnosis not present

## 2018-08-13 DIAGNOSIS — E1159 Type 2 diabetes mellitus with other circulatory complications: Secondary | ICD-10-CM | POA: Diagnosis not present

## 2018-08-13 DIAGNOSIS — I639 Cerebral infarction, unspecified: Secondary | ICD-10-CM | POA: Diagnosis not present

## 2018-08-14 DIAGNOSIS — E1159 Type 2 diabetes mellitus with other circulatory complications: Secondary | ICD-10-CM | POA: Diagnosis not present

## 2018-08-14 DIAGNOSIS — H5440 Blindness, one eye, unspecified eye: Secondary | ICD-10-CM | POA: Diagnosis not present

## 2018-08-14 DIAGNOSIS — I639 Cerebral infarction, unspecified: Secondary | ICD-10-CM | POA: Diagnosis not present

## 2018-08-15 DIAGNOSIS — H5440 Blindness, one eye, unspecified eye: Secondary | ICD-10-CM | POA: Diagnosis not present

## 2018-08-15 DIAGNOSIS — I639 Cerebral infarction, unspecified: Secondary | ICD-10-CM | POA: Diagnosis not present

## 2018-08-15 DIAGNOSIS — E1159 Type 2 diabetes mellitus with other circulatory complications: Secondary | ICD-10-CM | POA: Diagnosis not present

## 2018-08-16 DIAGNOSIS — E1159 Type 2 diabetes mellitus with other circulatory complications: Secondary | ICD-10-CM | POA: Diagnosis not present

## 2018-08-16 DIAGNOSIS — I639 Cerebral infarction, unspecified: Secondary | ICD-10-CM | POA: Diagnosis not present

## 2018-08-16 DIAGNOSIS — H5440 Blindness, one eye, unspecified eye: Secondary | ICD-10-CM | POA: Diagnosis not present

## 2018-08-17 DIAGNOSIS — E1159 Type 2 diabetes mellitus with other circulatory complications: Secondary | ICD-10-CM | POA: Diagnosis not present

## 2018-08-17 DIAGNOSIS — I639 Cerebral infarction, unspecified: Secondary | ICD-10-CM | POA: Diagnosis not present

## 2018-08-17 DIAGNOSIS — H5440 Blindness, one eye, unspecified eye: Secondary | ICD-10-CM | POA: Diagnosis not present

## 2018-08-18 DIAGNOSIS — I639 Cerebral infarction, unspecified: Secondary | ICD-10-CM | POA: Diagnosis not present

## 2018-08-18 DIAGNOSIS — E1159 Type 2 diabetes mellitus with other circulatory complications: Secondary | ICD-10-CM | POA: Diagnosis not present

## 2018-08-18 DIAGNOSIS — H5440 Blindness, one eye, unspecified eye: Secondary | ICD-10-CM | POA: Diagnosis not present

## 2018-08-19 DIAGNOSIS — H5440 Blindness, one eye, unspecified eye: Secondary | ICD-10-CM | POA: Diagnosis not present

## 2018-08-19 DIAGNOSIS — E1159 Type 2 diabetes mellitus with other circulatory complications: Secondary | ICD-10-CM | POA: Diagnosis not present

## 2018-08-19 DIAGNOSIS — I639 Cerebral infarction, unspecified: Secondary | ICD-10-CM | POA: Diagnosis not present

## 2018-08-20 DIAGNOSIS — H5440 Blindness, one eye, unspecified eye: Secondary | ICD-10-CM | POA: Diagnosis not present

## 2018-08-20 DIAGNOSIS — I639 Cerebral infarction, unspecified: Secondary | ICD-10-CM | POA: Diagnosis not present

## 2018-08-20 DIAGNOSIS — E1159 Type 2 diabetes mellitus with other circulatory complications: Secondary | ICD-10-CM | POA: Diagnosis not present

## 2018-08-21 DIAGNOSIS — I639 Cerebral infarction, unspecified: Secondary | ICD-10-CM | POA: Diagnosis not present

## 2018-08-21 DIAGNOSIS — H5440 Blindness, one eye, unspecified eye: Secondary | ICD-10-CM | POA: Diagnosis not present

## 2018-08-21 DIAGNOSIS — E1159 Type 2 diabetes mellitus with other circulatory complications: Secondary | ICD-10-CM | POA: Diagnosis not present

## 2018-08-22 DIAGNOSIS — H5440 Blindness, one eye, unspecified eye: Secondary | ICD-10-CM | POA: Diagnosis not present

## 2018-08-22 DIAGNOSIS — E1159 Type 2 diabetes mellitus with other circulatory complications: Secondary | ICD-10-CM | POA: Diagnosis not present

## 2018-08-22 DIAGNOSIS — I639 Cerebral infarction, unspecified: Secondary | ICD-10-CM | POA: Diagnosis not present

## 2018-08-23 DIAGNOSIS — I639 Cerebral infarction, unspecified: Secondary | ICD-10-CM | POA: Diagnosis not present

## 2018-08-23 DIAGNOSIS — E1159 Type 2 diabetes mellitus with other circulatory complications: Secondary | ICD-10-CM | POA: Diagnosis not present

## 2018-08-23 DIAGNOSIS — H5440 Blindness, one eye, unspecified eye: Secondary | ICD-10-CM | POA: Diagnosis not present

## 2018-08-24 DIAGNOSIS — I639 Cerebral infarction, unspecified: Secondary | ICD-10-CM | POA: Diagnosis not present

## 2018-08-24 DIAGNOSIS — H5440 Blindness, one eye, unspecified eye: Secondary | ICD-10-CM | POA: Diagnosis not present

## 2018-08-24 DIAGNOSIS — E1159 Type 2 diabetes mellitus with other circulatory complications: Secondary | ICD-10-CM | POA: Diagnosis not present

## 2018-08-25 DIAGNOSIS — I639 Cerebral infarction, unspecified: Secondary | ICD-10-CM | POA: Diagnosis not present

## 2018-08-25 DIAGNOSIS — H5440 Blindness, one eye, unspecified eye: Secondary | ICD-10-CM | POA: Diagnosis not present

## 2018-08-25 DIAGNOSIS — E1159 Type 2 diabetes mellitus with other circulatory complications: Secondary | ICD-10-CM | POA: Diagnosis not present

## 2018-08-26 DIAGNOSIS — H5440 Blindness, one eye, unspecified eye: Secondary | ICD-10-CM | POA: Diagnosis not present

## 2018-08-26 DIAGNOSIS — I639 Cerebral infarction, unspecified: Secondary | ICD-10-CM | POA: Diagnosis not present

## 2018-08-26 DIAGNOSIS — E1159 Type 2 diabetes mellitus with other circulatory complications: Secondary | ICD-10-CM | POA: Diagnosis not present

## 2018-08-26 NOTE — Telephone Encounter (Signed)
Message sent to provider 

## 2018-08-27 DIAGNOSIS — E1159 Type 2 diabetes mellitus with other circulatory complications: Secondary | ICD-10-CM | POA: Diagnosis not present

## 2018-08-27 DIAGNOSIS — H5440 Blindness, one eye, unspecified eye: Secondary | ICD-10-CM | POA: Diagnosis not present

## 2018-08-27 DIAGNOSIS — I639 Cerebral infarction, unspecified: Secondary | ICD-10-CM | POA: Diagnosis not present

## 2018-08-28 DIAGNOSIS — H5440 Blindness, one eye, unspecified eye: Secondary | ICD-10-CM | POA: Diagnosis not present

## 2018-08-28 DIAGNOSIS — E1159 Type 2 diabetes mellitus with other circulatory complications: Secondary | ICD-10-CM | POA: Diagnosis not present

## 2018-08-28 DIAGNOSIS — I639 Cerebral infarction, unspecified: Secondary | ICD-10-CM | POA: Diagnosis not present

## 2018-08-29 ENCOUNTER — Ambulatory Visit (INDEPENDENT_AMBULATORY_CARE_PROVIDER_SITE_OTHER): Payer: Medicaid Other | Admitting: Podiatry

## 2018-08-29 ENCOUNTER — Other Ambulatory Visit: Payer: Self-pay

## 2018-08-29 ENCOUNTER — Encounter: Payer: Self-pay | Admitting: Podiatry

## 2018-08-29 VITALS — Temp 97.9°F

## 2018-08-29 DIAGNOSIS — M79675 Pain in left toe(s): Secondary | ICD-10-CM

## 2018-08-29 DIAGNOSIS — Q828 Other specified congenital malformations of skin: Secondary | ICD-10-CM

## 2018-08-29 DIAGNOSIS — E1140 Type 2 diabetes mellitus with diabetic neuropathy, unspecified: Secondary | ICD-10-CM

## 2018-08-29 DIAGNOSIS — E1159 Type 2 diabetes mellitus with other circulatory complications: Secondary | ICD-10-CM | POA: Diagnosis not present

## 2018-08-29 DIAGNOSIS — M2041 Other hammer toe(s) (acquired), right foot: Secondary | ICD-10-CM

## 2018-08-29 DIAGNOSIS — M2042 Other hammer toe(s) (acquired), left foot: Secondary | ICD-10-CM

## 2018-08-29 DIAGNOSIS — E1149 Type 2 diabetes mellitus with other diabetic neurological complication: Secondary | ICD-10-CM | POA: Diagnosis not present

## 2018-08-29 DIAGNOSIS — I639 Cerebral infarction, unspecified: Secondary | ICD-10-CM | POA: Diagnosis not present

## 2018-08-29 DIAGNOSIS — M79674 Pain in right toe(s): Secondary | ICD-10-CM | POA: Diagnosis not present

## 2018-08-29 DIAGNOSIS — H5440 Blindness, one eye, unspecified eye: Secondary | ICD-10-CM | POA: Diagnosis not present

## 2018-08-29 DIAGNOSIS — B351 Tinea unguium: Secondary | ICD-10-CM | POA: Diagnosis not present

## 2018-08-29 NOTE — Progress Notes (Signed)
Subjective:   Patient ID: Frank Schneider, male   DOB: 43 y.o.   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  Today I went ahead and I debrided lesions bilateral with no iatrogenic bleeding and I debrided nailbeds 1-5 both feet with no iatrogenic bleeding will be seen back for routine care or if digital correction is decided on signed visit

## 2018-08-30 DIAGNOSIS — H5440 Blindness, one eye, unspecified eye: Secondary | ICD-10-CM | POA: Diagnosis not present

## 2018-08-30 DIAGNOSIS — E1159 Type 2 diabetes mellitus with other circulatory complications: Secondary | ICD-10-CM | POA: Diagnosis not present

## 2018-08-30 DIAGNOSIS — I639 Cerebral infarction, unspecified: Secondary | ICD-10-CM | POA: Diagnosis not present

## 2018-08-31 DIAGNOSIS — E1159 Type 2 diabetes mellitus with other circulatory complications: Secondary | ICD-10-CM | POA: Diagnosis not present

## 2018-08-31 DIAGNOSIS — H5440 Blindness, one eye, unspecified eye: Secondary | ICD-10-CM | POA: Diagnosis not present

## 2018-08-31 DIAGNOSIS — I639 Cerebral infarction, unspecified: Secondary | ICD-10-CM | POA: Diagnosis not present

## 2018-09-01 DIAGNOSIS — H5440 Blindness, one eye, unspecified eye: Secondary | ICD-10-CM | POA: Diagnosis not present

## 2018-09-01 DIAGNOSIS — I639 Cerebral infarction, unspecified: Secondary | ICD-10-CM | POA: Diagnosis not present

## 2018-09-01 DIAGNOSIS — E1159 Type 2 diabetes mellitus with other circulatory complications: Secondary | ICD-10-CM | POA: Diagnosis not present

## 2018-09-01 NOTE — Telephone Encounter (Signed)
Message sent to provider 

## 2018-09-02 DIAGNOSIS — H5440 Blindness, one eye, unspecified eye: Secondary | ICD-10-CM | POA: Diagnosis not present

## 2018-09-02 DIAGNOSIS — I639 Cerebral infarction, unspecified: Secondary | ICD-10-CM | POA: Diagnosis not present

## 2018-09-02 DIAGNOSIS — E1159 Type 2 diabetes mellitus with other circulatory complications: Secondary | ICD-10-CM | POA: Diagnosis not present

## 2018-09-03 DIAGNOSIS — H5440 Blindness, one eye, unspecified eye: Secondary | ICD-10-CM | POA: Diagnosis not present

## 2018-09-03 DIAGNOSIS — E1159 Type 2 diabetes mellitus with other circulatory complications: Secondary | ICD-10-CM | POA: Diagnosis not present

## 2018-09-03 DIAGNOSIS — I639 Cerebral infarction, unspecified: Secondary | ICD-10-CM | POA: Diagnosis not present

## 2018-09-04 DIAGNOSIS — H5440 Blindness, one eye, unspecified eye: Secondary | ICD-10-CM | POA: Diagnosis not present

## 2018-09-04 DIAGNOSIS — E1159 Type 2 diabetes mellitus with other circulatory complications: Secondary | ICD-10-CM | POA: Diagnosis not present

## 2018-09-04 DIAGNOSIS — I639 Cerebral infarction, unspecified: Secondary | ICD-10-CM | POA: Diagnosis not present

## 2018-09-05 DIAGNOSIS — E1159 Type 2 diabetes mellitus with other circulatory complications: Secondary | ICD-10-CM | POA: Diagnosis not present

## 2018-09-05 DIAGNOSIS — H5440 Blindness, one eye, unspecified eye: Secondary | ICD-10-CM | POA: Diagnosis not present

## 2018-09-05 DIAGNOSIS — I639 Cerebral infarction, unspecified: Secondary | ICD-10-CM | POA: Diagnosis not present

## 2018-09-06 DIAGNOSIS — H5440 Blindness, one eye, unspecified eye: Secondary | ICD-10-CM | POA: Diagnosis not present

## 2018-09-06 DIAGNOSIS — I639 Cerebral infarction, unspecified: Secondary | ICD-10-CM | POA: Diagnosis not present

## 2018-09-06 DIAGNOSIS — E1159 Type 2 diabetes mellitus with other circulatory complications: Secondary | ICD-10-CM | POA: Diagnosis not present

## 2018-09-07 DIAGNOSIS — I639 Cerebral infarction, unspecified: Secondary | ICD-10-CM | POA: Diagnosis not present

## 2018-09-07 DIAGNOSIS — E1159 Type 2 diabetes mellitus with other circulatory complications: Secondary | ICD-10-CM | POA: Diagnosis not present

## 2018-09-07 DIAGNOSIS — H5440 Blindness, one eye, unspecified eye: Secondary | ICD-10-CM | POA: Diagnosis not present

## 2018-09-08 DIAGNOSIS — H5440 Blindness, one eye, unspecified eye: Secondary | ICD-10-CM | POA: Diagnosis not present

## 2018-09-08 DIAGNOSIS — I639 Cerebral infarction, unspecified: Secondary | ICD-10-CM | POA: Diagnosis not present

## 2018-09-08 DIAGNOSIS — E1159 Type 2 diabetes mellitus with other circulatory complications: Secondary | ICD-10-CM | POA: Diagnosis not present

## 2018-09-09 ENCOUNTER — Other Ambulatory Visit: Payer: Self-pay

## 2018-09-09 ENCOUNTER — Ambulatory Visit (INDEPENDENT_AMBULATORY_CARE_PROVIDER_SITE_OTHER): Payer: Medicaid Other | Admitting: Family Medicine

## 2018-09-09 DIAGNOSIS — E1165 Type 2 diabetes mellitus with hyperglycemia: Secondary | ICD-10-CM | POA: Diagnosis not present

## 2018-09-09 DIAGNOSIS — I10 Essential (primary) hypertension: Secondary | ICD-10-CM

## 2018-09-09 DIAGNOSIS — H5440 Blindness, one eye, unspecified eye: Secondary | ICD-10-CM | POA: Diagnosis not present

## 2018-09-09 DIAGNOSIS — E1159 Type 2 diabetes mellitus with other circulatory complications: Secondary | ICD-10-CM

## 2018-09-09 DIAGNOSIS — I639 Cerebral infarction, unspecified: Secondary | ICD-10-CM | POA: Diagnosis not present

## 2018-09-09 DIAGNOSIS — E118 Type 2 diabetes mellitus with unspecified complications: Secondary | ICD-10-CM

## 2018-09-09 DIAGNOSIS — Z794 Long term (current) use of insulin: Secondary | ICD-10-CM

## 2018-09-09 DIAGNOSIS — IMO0002 Reserved for concepts with insufficient information to code with codable children: Secondary | ICD-10-CM

## 2018-09-09 MED ORDER — LISINOPRIL 20 MG PO TABS: 20.0000 mg | ORAL_TABLET | Freq: Every day | ORAL | 2 refills | Status: DC

## 2018-09-09 MED ORDER — INSULIN GLARGINE 100 UNIT/ML ~~LOC~~ SOLN: SUBCUTANEOUS | 11 refills | Status: DC

## 2018-09-09 MED ORDER — "INSULIN SYRINGE-NEEDLE U-100 31G X 5/16"" 1 ML MISC": 3 refills | Status: DC

## 2018-09-09 MED ORDER — AMLODIPINE BESYLATE 10 MG PO TABS: 10.0000 mg | ORAL_TABLET | Freq: Every day | ORAL | 5 refills | Status: DC

## 2018-09-09 MED FILL — TRUEPLUS SYR 1ML 31GX5/16: 31G X 5/16" | 30 days supply | Qty: 100 | Fill #0

## 2018-09-09 MED FILL — LISINOPRIL 20 MG TAB: 20 | 30 days supply | Qty: 30 | Fill #0

## 2018-09-09 MED FILL — AMLODIPINE BESYLATE 10 MG T: 10 | 30 days supply | Qty: 30 | Fill #0

## 2018-09-09 MED FILL — TRUEPLUS SYR 1ML 31GX5/16": 31G X 5/16" | 30 days supply | Qty: 100 | Fill #0

## 2018-09-09 MED FILL — LANTUS 100 UNITS/ML VIAL: 100 | 28 days supply | Qty: 10 | Fill #0

## 2018-09-09 NOTE — Progress Notes (Signed)
  Patient [facility]  Virtual Visit via Telephone Note  I connected with Frank Schneider on 09/09/18 at 10:20 AM EDT by telephone and verified that I am speaking with the correct person using two identifiers.   I discussed the limitations, risks, security and privacy concerns of performing an evaluation and management service by telephone and the availability of in person appointments. I also discussed with the patient that there may be a patient responsible charge related to this service. The patient expressed understanding and agreed to proceed.   History of Present Illness: Frank Schneider  has a past medical history of Blind right eye, Diabetes mellitus, Hypertension, Priapism, Stroke ([place]), Tobacco use, and Vision abnormalities. Patient reports compliance with medications and denies side effects at the present time. He denies regular glucose monitoring or exercise. Not following a dash diet. Denies CP, SOB, dizziness and leg swelling.    Observations/Objective: Patient with regular voice tone, rate and rhythm. Speaking calmly and is in no apparent distress.    Assessment and Plan: 1. Essential hypertension - amLODipine (NORVASC) 10 MG tablet; Take 1 tablet (10 mg total) by mouth daily.  Dispense: 30 tablet; Refill: 5 - lisinopril (ZESTRIL) 20 MG tablet; Take 1 tablet (20 mg total) by mouth daily.  Dispense: 30 tablet; Refill: 2  2. Uncontrolled type 2 diabetes mellitus with complication, with long-term current use of insulin ([facility]) - insulin glargine (LANTUS) 100 UNIT/ML injection; INJECT 30 UNITS INTO THE SKIN AT BEDTIME.  Dispense: 10 mL; Refill: 11  3. Type 2 diabetes mellitus with other circulatory complication, with long-term current use of insulin ([facility]) - Insulin Syringe-Needle U-100 (TRUEPLUS INSULIN SYRINGE) 31G X 5/16" 1 ML MISC; USE AS DIRECTED.  Dispense: 100 each; Refill: 3   No medication changes warranted at the present time.      Follow Up Instructions:  We discussed hand washing, using hand sanitizer when soap and water are not available, only going out when absolutely necessary, and social distancing. Explained to patient that he is immunocompromised and will need to take precautions during this time.   I discussed the assessment and treatment plan with the patient. The patient was provided an opportunity to ask questions and all were answered. The patient agreed with the plan and demonstrated an understanding of the instructions.   The patient was advised to call back or seek an in-person evaluation if the symptoms worsen or if the condition fails to improve as anticipated.  I provided 10 minutes of non-face-to-face time during this encounter.  Ms. Andr L. Nathaneil Canary, FNP-BC [facility] [address] [place], [place] [address] [phone]

## 2018-09-10 DIAGNOSIS — I639 Cerebral infarction, unspecified: Secondary | ICD-10-CM | POA: Diagnosis not present

## 2018-09-10 DIAGNOSIS — H5440 Blindness, one eye, unspecified eye: Secondary | ICD-10-CM | POA: Diagnosis not present

## 2018-09-10 DIAGNOSIS — E1159 Type 2 diabetes mellitus with other circulatory complications: Secondary | ICD-10-CM | POA: Diagnosis not present

## 2018-09-11 DIAGNOSIS — E1159 Type 2 diabetes mellitus with other circulatory complications: Secondary | ICD-10-CM | POA: Diagnosis not present

## 2018-09-11 DIAGNOSIS — I639 Cerebral infarction, unspecified: Secondary | ICD-10-CM | POA: Diagnosis not present

## 2018-09-11 DIAGNOSIS — H5440 Blindness, one eye, unspecified eye: Secondary | ICD-10-CM | POA: Diagnosis not present

## 2018-09-12 DIAGNOSIS — H5440 Blindness, one eye, unspecified eye: Secondary | ICD-10-CM | POA: Diagnosis not present

## 2018-09-12 DIAGNOSIS — I639 Cerebral infarction, unspecified: Secondary | ICD-10-CM | POA: Diagnosis not present

## 2018-09-12 DIAGNOSIS — E1159 Type 2 diabetes mellitus with other circulatory complications: Secondary | ICD-10-CM | POA: Diagnosis not present

## 2018-09-13 DIAGNOSIS — I639 Cerebral infarction, unspecified: Secondary | ICD-10-CM | POA: Diagnosis not present

## 2018-09-13 DIAGNOSIS — E1159 Type 2 diabetes mellitus with other circulatory complications: Secondary | ICD-10-CM | POA: Diagnosis not present

## 2018-09-13 DIAGNOSIS — H5440 Blindness, one eye, unspecified eye: Secondary | ICD-10-CM | POA: Diagnosis not present

## 2018-09-14 DIAGNOSIS — I639 Cerebral infarction, unspecified: Secondary | ICD-10-CM | POA: Diagnosis not present

## 2018-09-14 DIAGNOSIS — E1159 Type 2 diabetes mellitus with other circulatory complications: Secondary | ICD-10-CM | POA: Diagnosis not present

## 2018-09-14 DIAGNOSIS — H5440 Blindness, one eye, unspecified eye: Secondary | ICD-10-CM | POA: Diagnosis not present

## 2018-09-15 DIAGNOSIS — E1159 Type 2 diabetes mellitus with other circulatory complications: Secondary | ICD-10-CM | POA: Diagnosis not present

## 2018-09-15 DIAGNOSIS — I639 Cerebral infarction, unspecified: Secondary | ICD-10-CM | POA: Diagnosis not present

## 2018-09-15 DIAGNOSIS — H5440 Blindness, one eye, unspecified eye: Secondary | ICD-10-CM | POA: Diagnosis not present

## 2018-09-16 ENCOUNTER — Ambulatory Visit: Payer: Medicaid Other | Admitting: Family Medicine

## 2018-09-16 DIAGNOSIS — H5440 Blindness, one eye, unspecified eye: Secondary | ICD-10-CM | POA: Diagnosis not present

## 2018-09-16 DIAGNOSIS — I639 Cerebral infarction, unspecified: Secondary | ICD-10-CM | POA: Diagnosis not present

## 2018-09-16 DIAGNOSIS — E1159 Type 2 diabetes mellitus with other circulatory complications: Secondary | ICD-10-CM | POA: Diagnosis not present

## 2018-09-17 DIAGNOSIS — E1159 Type 2 diabetes mellitus with other circulatory complications: Secondary | ICD-10-CM | POA: Diagnosis not present

## 2018-09-17 DIAGNOSIS — I639 Cerebral infarction, unspecified: Secondary | ICD-10-CM | POA: Diagnosis not present

## 2018-09-17 DIAGNOSIS — H5440 Blindness, one eye, unspecified eye: Secondary | ICD-10-CM | POA: Diagnosis not present

## 2018-09-18 ENCOUNTER — Emergency Department (HOSPITAL_COMMUNITY): Payer: Medicaid Other

## 2018-09-18 ENCOUNTER — Encounter (HOSPITAL_COMMUNITY): Payer: Self-pay

## 2018-09-18 ENCOUNTER — Emergency Department (HOSPITAL_COMMUNITY)
Admission: EM | Admit: 2018-09-18 | Discharge: 2018-09-18 | Disposition: A | Discharge status: Home / Self Care | Payer: Medicaid Other | Attending: Emergency Medicine | Admitting: Emergency Medicine

## 2018-09-18 ENCOUNTER — Other Ambulatory Visit: Payer: Self-pay

## 2018-09-18 DIAGNOSIS — Z7982 Long term (current) use of aspirin: Secondary | ICD-10-CM | POA: Diagnosis not present

## 2018-09-18 DIAGNOSIS — E119 Type 2 diabetes mellitus without complications: Secondary | ICD-10-CM | POA: Insufficient documentation

## 2018-09-18 DIAGNOSIS — Z79899 Other long term (current) drug therapy: Secondary | ICD-10-CM | POA: Diagnosis not present

## 2018-09-18 DIAGNOSIS — F17210 Nicotine dependence, cigarettes, uncomplicated: Secondary | ICD-10-CM | POA: Insufficient documentation

## 2018-09-18 DIAGNOSIS — E1159 Type 2 diabetes mellitus with other circulatory complications: Secondary | ICD-10-CM | POA: Diagnosis not present

## 2018-09-18 DIAGNOSIS — I10 Essential (primary) hypertension: Secondary | ICD-10-CM | POA: Diagnosis not present

## 2018-09-18 DIAGNOSIS — Z03818 Encounter for observation for suspected exposure to other biological agents ruled out: Secondary | ICD-10-CM | POA: Diagnosis not present

## 2018-09-18 DIAGNOSIS — Z20828 Contact with and (suspected) exposure to other viral communicable diseases: Secondary | ICD-10-CM | POA: Insufficient documentation

## 2018-09-18 DIAGNOSIS — L988 Other specified disorders of the skin and subcutaneous tissue: Secondary | ICD-10-CM | POA: Diagnosis present

## 2018-09-18 DIAGNOSIS — I639 Cerebral infarction, unspecified: Secondary | ICD-10-CM | POA: Diagnosis not present

## 2018-09-18 DIAGNOSIS — K610 Anal abscess: Secondary | ICD-10-CM | POA: Diagnosis not present

## 2018-09-18 DIAGNOSIS — H5440 Blindness, one eye, unspecified eye: Secondary | ICD-10-CM | POA: Diagnosis not present

## 2018-09-18 DIAGNOSIS — Z794 Long term (current) use of insulin: Secondary | ICD-10-CM | POA: Diagnosis not present

## 2018-09-18 LAB — BASIC METABOLIC PANEL
Anion gap: 10 (ref 5–15)
BUN: 13 mg/dL (ref 6–20)
CO2: 26 mmol/L (ref 22–32)
Calcium: 9.2 mg/dL (ref 8.9–10.3)
Chloride: 102 mmol/L (ref 98–111)
Creatinine, Ser: 0.99 mg/dL (ref 0.61–1.24)
GFR calc Af Amer: >60 mL/min (ref >60)
GFR calc non Af Amer: >60 mL/min (ref >60)
Glucose, Bld: 153 mg/dL — ABNORMAL HIGH (ref 70–99)
Potassium: 4.7 mmol/L (ref 3.5–5.1)
Sodium: 138 mmol/L (ref 135–145)

## 2018-09-18 LAB — CBC WITH DIFFERENTIAL/PLATELET
Abs Immature Granulocytes: 0 10*3/uL (ref 0.00–0.07)
Basophils Absolute: 0 10*3/uL (ref 0.0–0.1)
Basophils Relative: 0 %
Eosinophils Absolute: 0.4 10*3/uL (ref 0.0–0.5)
Eosinophils Relative: 3 %
HCT: 37.7 % — ABNORMAL LOW (ref 39.0–52.0)
Hemoglobin: 11.6 g/dL — ABNORMAL LOW (ref 13.0–17.0)
Lymphocytes Relative: 21 %
Lymphs Abs: 2.8 10*3/uL (ref 0.7–4.0)
MCH: 19.4 pg — ABNORMAL LOW (ref 26.0–34.0)
MCHC: 30.8 g/dL (ref 30.0–36.0)
MCV: 63.1 fL — ABNORMAL LOW (ref 80.0–100.0)
Monocytes Absolute: 0.7 10*3/uL (ref 0.1–1.0)
Monocytes Relative: 5 %
Neutro Abs: 9.5 10*3/uL — ABNORMAL HIGH (ref 1.7–7.7)
Neutrophils Relative %: 71 %
Platelets: 192 10*3/uL (ref 150–400)
RBC: 5.97 MIL/uL — ABNORMAL HIGH (ref 4.22–5.81)
RDW: 15.4 % (ref 11.5–15.5)
WBC: 13.4 10*3/uL — ABNORMAL HIGH (ref 4.0–10.5)
nRBC: 0 % (ref 0.0–0.2)
nRBC: 0 /100 WBC

## 2018-09-18 LAB — SARS CORONAVIRUS 2 BY RT PCR (HOSPITAL ORDER, PERFORMED IN ~~LOC~~ HOSPITAL LAB): SARS Coronavirus 2: NEGATIVE

## 2018-09-18 LAB — LACTIC ACID, PLASMA: Lactic Acid, Venous: 1 mmol/L (ref 0.5–1.9)

## 2018-09-18 MED ORDER — LIDOCAINE HCL (PF) 1 % IJ SOLN
10.0000 mL | Freq: Once | INTRAMUSCULAR | Status: AC
  Administered 2018-09-18: 10 mL
  Filled 2018-09-18: qty 10

## 2018-09-18 MED ORDER — NAPROXEN 500 MG PO TABS: 500.0000 mg | ORAL_TABLET | Freq: Two times a day (BID) | ORAL | 0 refills | Status: DC | PRN

## 2018-09-18 MED ORDER — IOHEXOL 300 MG/ML  SOLN
100.0000 mL | Freq: Once | INTRAMUSCULAR | Status: AC | PRN
  Administered 2018-09-18: 18:00:00 100 mL via INTRAVENOUS

## 2018-09-18 MED ORDER — CLINDAMYCIN HCL 300 MG PO CAPS: 600.0000 mg | ORAL_CAPSULE | Freq: Three times a day (TID) | ORAL | 0 refills | Status: AC

## 2018-09-18 MED ORDER — HYDROCODONE-ACETAMINOPHEN 5-325 MG PO TABS: 1.0000 | ORAL_TABLET | Freq: Four times a day (QID) | ORAL | 0 refills | Status: DC | PRN

## 2018-09-18 MED ORDER — MORPHINE SULFATE (PF) 4 MG/ML IV SOLN
4.0000 mg | Freq: Once | INTRAVENOUS | Status: AC
  Administered 2018-09-18: 15:00:00 4 mg via INTRAVENOUS
  Filled 2018-09-18: qty 1

## 2018-09-18 NOTE — ED Provider Notes (Signed)
Frank Schneider Provider Note   CSN: [id number] Arrival date & time: 09/18/18  1337    History   Chief Complaint Chief Complaint  Patient presents with  . Abscess    HPI    Frank Schneider is a 43 y.o. male with a PMHx of DM2, HTN, CVA, R eye blindness, prior perirectal abscess/early Fournier's gangrene s/p I&D by  Dr. Renelda Loma in 07/2011, and other conditions listed below, who presents to the ED with complaints of left perianal abscess that began 5 days ago.  Patient states this is in the same area as his prior abscess that required I&D in 2013.  He states that it began 5 days ago and has gotten worse.  He describes the pain as 8/10 constant aching nonradiating perianal pain that worsens with palpation of the area and has been minimally improved with warm compresses, Epsom salt soaks, and ibuprofen.  He reports associated swelling.  He denies any drainage or warmth, he is unable to see the area so he does not know whether there is any erythema.  He has not had any mucus in his stools, hematochezia, or melena.  He denies any fevers, chills, chest pain, shortness of breath, abdominal pain, nausea, vomiting, diarrhea, constipation, dysuria, hematuria, numbness, tingling, focal weakness, cough, URI symptoms, or any other complaints at this time.  His PCP is Lanae Boast FNP [facility].  He hasn't eaten anything since last night, had some sips of water today and that's it.  He takes lantus 20U at bed time which he's compliant with, but he has been noncompliant with his metformin, lisinopril, and amlodipine.  Hasn't taken BP meds today.    The history is provided by the patient and medical records. No language interpreter was used.  Abscess  Associated symptoms: no fever, no nausea and no vomiting     Past Medical History:  Diagnosis Date  . Blind right eye   . Diabetes mellitus    Takes Metformin  . Hypertension   . Priapism   . Stroke  ([place])   . Tobacco use   . Vision abnormalities     Patient Active Problem List   Diagnosis Date Noted  . Dysphagia 04/30/2018  . Ischemic stroke ([place]) 04/29/2018  . 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 Acute appendicitis 12/16/2015  . Right cataract 10/04/2015  . Tobacco dependence 10/04/2015  . Absolute anemia 08/07/2015  . Neuropathy 08/07/2015  . Cerebrovascular accident (CVA) ([place])   . Leukocytosis   . Stroke (cerebrum) ([place]) 05/09/2015  . Type 2 diabetes mellitus with circulatory disorder ([place]) 03/12/2015  . Hyperlipidemia 02/14/2015  . History of CVA (cerebrovascular accident) 02/14/2015  . CVA (cerebral vascular accident) ([place]) 01/01/2015  . Diabetes mellitus type 2 with complications, uncontrolled ([place]) 01/01/2015  . Stroke ([place])   . Essential hypertension   . Smoker   . Tobacco use     Past Surgical History:  Procedure Laterality Date  . APPENDECTOMY    . EYE SURGERY     cataract removed 05/27/2016 right eye   . INCISION AND DRAINAGE PERIRECTAL ABSCESS  07/28/2011   Procedure: IRRIGATION AND DEBRIDEMENT PERIRECTAL ABSCESS;  Surgeon: Adin Hector, MD;  Location: [facility];  Service: General;  Laterality: N/A;   of skin muscle and subcutaneous tissue of perimeum  8cmx12cm area   . IR GENERIC HISTORICAL  01/08/2016   IR RADIOLOGIST EVAL & MGMT 01/08/2016 [facility] INTERV RAD  . LAPAROSCOPIC APPENDECTOMY N/A 12/16/2015  Procedure: APPENDECTOMY LAPAROSCOPIC;  Surgeon: Coralie Keens, MD;  Location: [facility];  Service: General;  Laterality: N/A;  . PENECTOMY          Home Medications    Prior to Admission medications   Medication Sig Start Date End Date Taking? Authorizing Provider  amLODipine (NORVASC) 10 MG tablet Take 1 tablet (10 mg total) by mouth daily. 09/09/18   Lanae Boast, FNP  aspirin 325 MG tablet Take 1 tablet (325 mg total) by mouth daily. 05/02/18   Raiford Noble Latif, DO  atorvastatin (LIPITOR) 40 MG tablet Take 1 tablet (40 mg total) by mouth daily. 05/01/18  05/01/19  Raiford Noble Latif, DO  atropine 1 % ophthalmic ointment Place 1 application into the right eye daily. 09/06/16   Deno Etienne, DO  Blood Glucose Monitoring Suppl (TRUE METRIX AIR GLUCOSE METER) DEVI 1 each by Does not apply route 2 (two) times daily at 10 AM and 5 PM. 06/15/17   Dorena Dew, FNP  ferrous sulfate 325 (65 FE) MG tablet Take 1 tablet (325 mg total) daily with breakfast by mouth. 03/12/17   Dorena Dew, FNP  glucose blood (TRUE METRIX BLOOD GLUCOSE TEST) test strip Use as instructed 07/20/18   Lanae Boast, FNP  insulin glargine (LANTUS) 100 UNIT/ML injection INJECT 30 UNITS INTO THE SKIN AT BEDTIME. 09/09/18   Lanae Boast, FNP  Insulin Syringe-Needle U-100 (TRUEPLUS INSULIN SYRINGE) 31G X 5/16" 1 ML MISC USE AS DIRECTED. 09/09/18   Lanae Boast, FNP  Lancets (FREESTYLE) lancets Use as instructed 01/03/15   Reyne Dumas, MD  lisinopril (ZESTRIL) 20 MG tablet Take 1 tablet (20 mg total) by mouth daily. 09/09/18   Lanae Boast, FNP    Family History Family History  Problem Relation Age of Onset  . Diabetes Mother   . Hypertension Mother   . Diabetes Maternal Grandmother   . Hypertension Maternal Grandmother   . Depression Maternal Grandmother     Social History Social History   Tobacco Use  . Smoking status: Current Some Day Smoker    Packs/day: 0.25    Years: 8.00    Pack years: 2.00    Types: Cigarettes  . Smokeless tobacco: Never Used  Substance Use Topics  . Alcohol use: No  . Drug use: Yes    Types: Marijuana    Comment: 3x wk     Allergies   Patient has no known allergies.   Review of Systems Review of Systems  Constitutional: Negative for chills and fever.  HENT: Negative for rhinorrhea and sore throat.   Respiratory: Negative for cough and shortness of breath.   Cardiovascular: Negative for chest pain.  Gastrointestinal: Positive for rectal pain (abscess). Negative for abdominal pain, blood in stool, constipation, diarrhea,  nausea and vomiting.  Genitourinary: Negative for dysuria and hematuria.  Musculoskeletal: Negative for arthralgias and myalgias.  Skin: Positive for wound (abscess). Negative for color change.  Allergic/Immunologic: Positive for immunocompromised state (DM2).  Neurological: Negative for weakness and numbness.  Psychiatric/Behavioral: Negative for confusion.   All other systems reviewed and are negative for acute change except as noted in the HPI.    Physical Exam Updated Vital Signs BP (!) 179/82 (BP Location: Right Arm)   Pulse 92   Temp 98.5 F (36.9 C) (Oral)   Resp 17   Ht 5\' 10"  (1.778 m)   Wt 89.8 kg   SpO2 99%   BMI 28.41 kg/m   Physical Exam Vitals signs and nursing note reviewed. Exam  conducted with a chaperone present.  Constitutional:      General: He is not in acute distress.    Appearance: Normal appearance. He is well-developed. He is not toxic-appearing.     Comments: Afebrile, nontoxic, NAD, HTN noted similar to prior  HENT:     Head: Normocephalic and atraumatic.  Eyes:     General:        Right eye: No discharge.        Left eye: No discharge.     Conjunctiva/sclera: Conjunctivae normal.  Neck:     Musculoskeletal: Normal range of motion and neck supple.  Cardiovascular:     Rate and Rhythm: Normal rate and regular rhythm.     Pulses: Normal pulses.     Heart sounds: Normal heart sounds, S1 normal and S2 normal. No murmur. No friction rub. No gallop.   Pulmonary:     Effort: Pulmonary effort is normal. No respiratory distress.     Breath sounds: Normal breath sounds. No decreased breath sounds, wheezing, rhonchi or rales.  Abdominal:     General: Bowel sounds are normal. There is no distension.     Palpations: Abdomen is soft. Abdomen is not rigid.     Tenderness: There is no abdominal tenderness. There is no right CVA tenderness, left CVA tenderness, guarding or rebound. Negative signs include Murphy's sign and McBurney's sign.     Comments: Soft,  NTND, +BS throughout, no r/g/r, neg murphy's, neg mcburney's, no CVA TTP   Genitourinary:    Rectum: Tenderness present. No external hemorrhoid or internal hemorrhoid. Normal anal tone.       Comments: Chaperone present for exam ~5x5cm fluctuant abscess to the 2'o'clock position adjacent to the anal opening, extends to the anal margin, unclear if it connects with the rectum, some tenderness with DRE in that area; area is erythematous and warm to touch, induration to the edge of the abscess, exquisitely TTP, with some yellow slough overlying the center of the abscess with purulent drainage leaking in the center but not easily expressible. No red streaking away from the area. No hemorrhoids noted. Good anal tone.  Musculoskeletal: Normal range of motion.  Skin:    General: Skin is warm and dry.     Findings: No rash.  Neurological:     Mental Status: He is alert and oriented to person, place, and time.     Sensory: Sensation is intact. No sensory deficit.     Motor: Motor function is intact.  Psychiatric:        Mood and Affect: Mood and affect normal.        Behavior: Behavior normal.      ED Treatments / Results  Labs (all labs ordered are listed, but only abnormal results are displayed) Labs Reviewed  CBC WITH DIFFERENTIAL/PLATELET - Abnormal; Notable for the following components:      Result Value   WBC 13.4 (*)    RBC 5.97 (*)    Hemoglobin 11.6 (*)    HCT 37.7 (*)    MCV 63.1 (*)    MCH 19.4 (*)    Neutro Abs 9.5 (*)    All other components within normal limits  BASIC METABOLIC PANEL - Abnormal; Notable for the following components:   Glucose, Bld 153 (*)    All other components within normal limits  SARS CORONAVIRUS 2 (HOSPITAL ORDER, [place] [facility])  LACTIC ACID, PLASMA    EKG None  Radiology Ct Pelvis W Contrast  Result Date: 09/18/2018 CLINICAL DATA:  Abscess of anal and rectal region. History of same. Refractory to warm compresses.  EXAM: CT PELVIS WITH CONTRAST TECHNIQUE: Multidetector CT imaging of the pelvis was performed using the standard protocol following the bolus administration of intravenous contrast. CONTRAST:  113mL OMNIPAQUE IOHEXOL 300 MG/ML  SOLN COMPARISON:  None. FINDINGS: Urinary Tract:  Bladder appears normal. Bowel:  No dilated bowel loops within the pelvis. Vascular/Lymphatic: Atherosclerosis. Reproductive:  No mass or other significant abnormality Other: Perianal abscess collection measures 3.8 x 2.9 cm. Surrounding edema along the gluteal folds, LEFT greater than RIGHT. No intraperitoneal extension or extension to the perirectal fat. Musculoskeletal: No suspicious bone lesions identified. IMPRESSION: Perianal abscess collection measures 3.8 x 2.9 cm. Surrounding edema along the gluteal folds, left greater than right. Aortic Atherosclerosis (ICD10-I70.0). Electronically Signed   By: Franki Cabot M.D.   On: 09/18/2018 18:14    Procedures .Marland KitchenIncision and Drainage Date/Time: 09/18/2018 7:30 PM Performed by: Reece Agar, PA-C Authorized by: Venetia Maxon, [place], [place]   Consent:    Consent obtained:  Verbal   Consent given by:  Patient   Risks discussed:  Incomplete drainage, damage to other organs and pain   Alternatives discussed:  Referral Location:    Type:  Abscess   Size:  3x3cm   Location:  Anogenital   Anogenital location:  Perianal Pre-procedure details:    Skin preparation:  Betadine Anesthesia (see MAR for exact dosages):    Anesthesia method:  Local infiltration   Local anesthetic:  Lidocaine 1% w/o epi Procedure type:    Complexity:  Complex Procedure details:    Needle aspiration: no     Incision types:  Stab incision   Incision depth:  Subcutaneous   Scalpel blade:  11   Wound management:  Probed and deloculated and debrided   Drainage:  Purulent   Drainage amount:  Copious   Wound treatment:  Wound left open   Packing materials:  None Post-procedure details:    Patient  tolerance of procedure:  Tolerated well, no immediate complications   (including critical care time)  Medications Ordered in ED Medications  lidocaine (PF) (XYLOCAINE) 1 % injection 10 mL (has no administration in time range)  morphine 4 MG/ML injection 4 mg (4 mg Intravenous Given 09/18/18 1456)  iohexol (OMNIPAQUE) 300 MG/ML solution 100 mL (100 mLs Intravenous Contrast Given 09/18/18 1744)     Initial Impression / Assessment and Plan / ED Course  I have reviewed the triage vital signs and the nursing notes.  Pertinent labs & imaging results that were available during my care of the patient were reviewed by me and considered in my medical decision making (see chart for details).        43 y.o. male here with perirectal abscess to L of anal opening starting 5 days ago. Hx of same, had to have I&D in 2013 (had early Fournier's). On exam, no abdominal tenderness, ~5x5cm fluctuant abscess to the 2'o'clock position adjacent to the anal opening, extends to the anal margin, unclear if it connects with the rectum, area is erythematous and warm to touch, induration to the edge of the area, exquisitely TTP, with some purulent drainage leaking to the center but not easily expressible. Will get labs and CT pelvis to evaluate extent of abscess, will give pain meds and reassess. Discussed case with my attending Dr. Sedonia Small who agrees with plan.   7:06 PM CBC w/diff with leukocytosis WBC 13.4, mild chronic anemia. BMP with gluc 153  but otherwise WNL. Lactic WNL. COVID neg. CT pelv with perianal abscess collection measuring 3.8 x 2.9cm with surrounding edema along gluteal folds, L>R; no intraperitoneal extension or extension into perirectal fat. Will proceed with I&D of this area, verbal consent obtained.   7:43 PM I&D performed, copious amount of purulent drainage expressed, debrided the skin slough, probed and deloculated the area well. No packing placed due to location and proximity to anal opening,  concern for fecal contamination of packing. Left open. Will send home with pain meds and abx. Advised warm compresses and sitz baths. F/up with PCP in 3 days for wound recheck. Strict return precautions advised. I explained the diagnosis and have given explicit precautions to return to the ER including for any other new or worsening symptoms. The patient understands and accepts the medical plan as it's been dictated and I have answered their questions. Discharge instructions concerning home care and prescriptions have been given. The patient is STABLE and is discharged to home in good condition.     Final Clinical Impressions(s) / ED Diagnoses   Final diagnoses:  Perianal abscess    ED Discharge Orders         Ordered    clindamycin (CLEOCIN) 300 MG capsule  3 times daily     09/18/18 1937    naproxen (NAPROSYN) 500 MG tablet  2 times daily PRN     09/18/18 1937    HYDROcodone-acetaminophen (NORCO) 5-325 MG tablet  Every 6 hours PRN     09/18/18 [address] [place], [place] 09/18/18 1943    Maudie Flakes, MD 09/19/18 1329

## 2018-09-18 NOTE — ED Triage Notes (Signed)
Pt arrives POV for eval of abscess on anus. Pt states he has hx of same but this one has worsened and not improved. Pt reports he has tried warm compresses w/ no relief.

## 2018-09-18 NOTE — ED Notes (Signed)
Patient transported to CT 

## 2018-09-18 NOTE — ED Notes (Signed)
Patient verbalizes understanding of discharge instructions. Opportunity for questioning and answers were provided. Armband removed by staff, pt discharged from ED ambulatory to home.  

## 2018-09-18 NOTE — Discharge Instructions (Addendum)
Keep wound clean and dry. Apply warm compresses to affected area throughout the day and perform warm sitz baths. Take antibiotic until it is finished. Take naprosyn and norco as directed, as needed for pain but do not drive or operate machinery with pain medication use. Follow-up with [facility]Primary Care doctor in 2-3 days for wound recheck. Monitor area for signs of infection to include, but not limited to: increasing pain, spreading redness, drainage/pus, worsening swelling, or fevers. Return to emergency department for emergent changing or worsening symptoms.

## 2018-09-19 DIAGNOSIS — E1159 Type 2 diabetes mellitus with other circulatory complications: Secondary | ICD-10-CM | POA: Diagnosis not present

## 2018-09-19 DIAGNOSIS — H5440 Blindness, one eye, unspecified eye: Secondary | ICD-10-CM | POA: Diagnosis not present

## 2018-09-19 DIAGNOSIS — I639 Cerebral infarction, unspecified: Secondary | ICD-10-CM | POA: Diagnosis not present

## 2018-09-20 DIAGNOSIS — E1159 Type 2 diabetes mellitus with other circulatory complications: Secondary | ICD-10-CM | POA: Diagnosis not present

## 2018-09-20 DIAGNOSIS — I639 Cerebral infarction, unspecified: Secondary | ICD-10-CM | POA: Diagnosis not present

## 2018-09-20 DIAGNOSIS — H5440 Blindness, one eye, unspecified eye: Secondary | ICD-10-CM | POA: Diagnosis not present

## 2018-09-21 DIAGNOSIS — H5440 Blindness, one eye, unspecified eye: Secondary | ICD-10-CM | POA: Diagnosis not present

## 2018-09-21 DIAGNOSIS — E1159 Type 2 diabetes mellitus with other circulatory complications: Secondary | ICD-10-CM | POA: Diagnosis not present

## 2018-09-21 DIAGNOSIS — I639 Cerebral infarction, unspecified: Secondary | ICD-10-CM | POA: Diagnosis not present

## 2018-09-22 DIAGNOSIS — E1159 Type 2 diabetes mellitus with other circulatory complications: Secondary | ICD-10-CM | POA: Diagnosis not present

## 2018-09-22 DIAGNOSIS — I639 Cerebral infarction, unspecified: Secondary | ICD-10-CM | POA: Diagnosis not present

## 2018-09-22 DIAGNOSIS — H5440 Blindness, one eye, unspecified eye: Secondary | ICD-10-CM | POA: Diagnosis not present

## 2018-09-23 DIAGNOSIS — H5440 Blindness, one eye, unspecified eye: Secondary | ICD-10-CM | POA: Diagnosis not present

## 2018-09-23 DIAGNOSIS — E1159 Type 2 diabetes mellitus with other circulatory complications: Secondary | ICD-10-CM | POA: Diagnosis not present

## 2018-09-23 DIAGNOSIS — I639 Cerebral infarction, unspecified: Secondary | ICD-10-CM | POA: Diagnosis not present

## 2018-09-24 DIAGNOSIS — I639 Cerebral infarction, unspecified: Secondary | ICD-10-CM | POA: Diagnosis not present

## 2018-09-24 DIAGNOSIS — H5440 Blindness, one eye, unspecified eye: Secondary | ICD-10-CM | POA: Diagnosis not present

## 2018-09-24 DIAGNOSIS — E1159 Type 2 diabetes mellitus with other circulatory complications: Secondary | ICD-10-CM | POA: Diagnosis not present

## 2018-09-25 DIAGNOSIS — I639 Cerebral infarction, unspecified: Secondary | ICD-10-CM | POA: Diagnosis not present

## 2018-09-25 DIAGNOSIS — E1159 Type 2 diabetes mellitus with other circulatory complications: Secondary | ICD-10-CM | POA: Diagnosis not present

## 2018-09-25 DIAGNOSIS — H5440 Blindness, one eye, unspecified eye: Secondary | ICD-10-CM | POA: Diagnosis not present

## 2018-09-26 ENCOUNTER — Encounter: Payer: Self-pay | Admitting: Family Medicine

## 2018-09-26 DIAGNOSIS — I639 Cerebral infarction, unspecified: Secondary | ICD-10-CM | POA: Diagnosis not present

## 2018-09-26 DIAGNOSIS — H5440 Blindness, one eye, unspecified eye: Secondary | ICD-10-CM | POA: Diagnosis not present

## 2018-09-26 DIAGNOSIS — E1159 Type 2 diabetes mellitus with other circulatory complications: Secondary | ICD-10-CM | POA: Diagnosis not present

## 2018-09-27 DIAGNOSIS — I639 Cerebral infarction, unspecified: Secondary | ICD-10-CM | POA: Diagnosis not present

## 2018-09-27 DIAGNOSIS — H5440 Blindness, one eye, unspecified eye: Secondary | ICD-10-CM | POA: Diagnosis not present

## 2018-09-27 DIAGNOSIS — E1159 Type 2 diabetes mellitus with other circulatory complications: Secondary | ICD-10-CM | POA: Diagnosis not present

## 2018-09-28 DIAGNOSIS — E1159 Type 2 diabetes mellitus with other circulatory complications: Secondary | ICD-10-CM | POA: Diagnosis not present

## 2018-09-28 DIAGNOSIS — H5440 Blindness, one eye, unspecified eye: Secondary | ICD-10-CM | POA: Diagnosis not present

## 2018-09-28 DIAGNOSIS — I639 Cerebral infarction, unspecified: Secondary | ICD-10-CM | POA: Diagnosis not present

## 2018-09-29 DIAGNOSIS — I639 Cerebral infarction, unspecified: Secondary | ICD-10-CM | POA: Diagnosis not present

## 2018-09-29 DIAGNOSIS — H5440 Blindness, one eye, unspecified eye: Secondary | ICD-10-CM | POA: Diagnosis not present

## 2018-09-29 DIAGNOSIS — E1159 Type 2 diabetes mellitus with other circulatory complications: Secondary | ICD-10-CM | POA: Diagnosis not present

## 2018-09-30 DIAGNOSIS — H5440 Blindness, one eye, unspecified eye: Secondary | ICD-10-CM | POA: Diagnosis not present

## 2018-09-30 DIAGNOSIS — E1159 Type 2 diabetes mellitus with other circulatory complications: Secondary | ICD-10-CM | POA: Diagnosis not present

## 2018-09-30 DIAGNOSIS — I639 Cerebral infarction, unspecified: Secondary | ICD-10-CM | POA: Diagnosis not present

## 2018-10-01 DIAGNOSIS — E1159 Type 2 diabetes mellitus with other circulatory complications: Secondary | ICD-10-CM | POA: Diagnosis not present

## 2018-10-01 DIAGNOSIS — I639 Cerebral infarction, unspecified: Secondary | ICD-10-CM | POA: Diagnosis not present

## 2018-10-01 DIAGNOSIS — H5440 Blindness, one eye, unspecified eye: Secondary | ICD-10-CM | POA: Diagnosis not present

## 2018-10-02 DIAGNOSIS — H5440 Blindness, one eye, unspecified eye: Secondary | ICD-10-CM | POA: Diagnosis not present

## 2018-10-02 DIAGNOSIS — I639 Cerebral infarction, unspecified: Secondary | ICD-10-CM | POA: Diagnosis not present

## 2018-10-02 DIAGNOSIS — E1159 Type 2 diabetes mellitus with other circulatory complications: Secondary | ICD-10-CM | POA: Diagnosis not present

## 2018-10-03 DIAGNOSIS — H5440 Blindness, one eye, unspecified eye: Secondary | ICD-10-CM | POA: Diagnosis not present

## 2018-10-03 DIAGNOSIS — E1159 Type 2 diabetes mellitus with other circulatory complications: Secondary | ICD-10-CM | POA: Diagnosis not present

## 2018-10-03 DIAGNOSIS — I639 Cerebral infarction, unspecified: Secondary | ICD-10-CM | POA: Diagnosis not present

## 2018-10-04 DIAGNOSIS — I639 Cerebral infarction, unspecified: Secondary | ICD-10-CM | POA: Diagnosis not present

## 2018-10-04 DIAGNOSIS — E1159 Type 2 diabetes mellitus with other circulatory complications: Secondary | ICD-10-CM | POA: Diagnosis not present

## 2018-10-04 DIAGNOSIS — H5440 Blindness, one eye, unspecified eye: Secondary | ICD-10-CM | POA: Diagnosis not present

## 2018-10-05 DIAGNOSIS — I639 Cerebral infarction, unspecified: Secondary | ICD-10-CM | POA: Diagnosis not present

## 2018-10-05 DIAGNOSIS — E1159 Type 2 diabetes mellitus with other circulatory complications: Secondary | ICD-10-CM | POA: Diagnosis not present

## 2018-10-05 DIAGNOSIS — H5440 Blindness, one eye, unspecified eye: Secondary | ICD-10-CM | POA: Diagnosis not present

## 2018-10-06 DIAGNOSIS — E1159 Type 2 diabetes mellitus with other circulatory complications: Secondary | ICD-10-CM | POA: Diagnosis not present

## 2018-10-06 DIAGNOSIS — I639 Cerebral infarction, unspecified: Secondary | ICD-10-CM | POA: Diagnosis not present

## 2018-10-06 DIAGNOSIS — H5440 Blindness, one eye, unspecified eye: Secondary | ICD-10-CM | POA: Diagnosis not present

## 2018-10-07 DIAGNOSIS — H5440 Blindness, one eye, unspecified eye: Secondary | ICD-10-CM | POA: Diagnosis not present

## 2018-10-07 DIAGNOSIS — E1159 Type 2 diabetes mellitus with other circulatory complications: Secondary | ICD-10-CM | POA: Diagnosis not present

## 2018-10-07 DIAGNOSIS — I639 Cerebral infarction, unspecified: Secondary | ICD-10-CM | POA: Diagnosis not present

## 2018-10-08 DIAGNOSIS — I639 Cerebral infarction, unspecified: Secondary | ICD-10-CM | POA: Diagnosis not present

## 2018-10-08 DIAGNOSIS — E1159 Type 2 diabetes mellitus with other circulatory complications: Secondary | ICD-10-CM | POA: Diagnosis not present

## 2018-10-08 DIAGNOSIS — H5440 Blindness, one eye, unspecified eye: Secondary | ICD-10-CM | POA: Diagnosis not present

## 2018-10-09 DIAGNOSIS — I639 Cerebral infarction, unspecified: Secondary | ICD-10-CM | POA: Diagnosis not present

## 2018-10-09 DIAGNOSIS — E1159 Type 2 diabetes mellitus with other circulatory complications: Secondary | ICD-10-CM | POA: Diagnosis not present

## 2018-10-09 DIAGNOSIS — H5440 Blindness, one eye, unspecified eye: Secondary | ICD-10-CM | POA: Diagnosis not present

## 2018-10-10 DIAGNOSIS — E1159 Type 2 diabetes mellitus with other circulatory complications: Secondary | ICD-10-CM | POA: Diagnosis not present

## 2018-10-10 DIAGNOSIS — I639 Cerebral infarction, unspecified: Secondary | ICD-10-CM | POA: Diagnosis not present

## 2018-10-10 DIAGNOSIS — H5440 Blindness, one eye, unspecified eye: Secondary | ICD-10-CM | POA: Diagnosis not present

## 2018-10-11 DIAGNOSIS — I639 Cerebral infarction, unspecified: Secondary | ICD-10-CM | POA: Diagnosis not present

## 2018-10-11 DIAGNOSIS — H5440 Blindness, one eye, unspecified eye: Secondary | ICD-10-CM | POA: Diagnosis not present

## 2018-10-11 DIAGNOSIS — E1159 Type 2 diabetes mellitus with other circulatory complications: Secondary | ICD-10-CM | POA: Diagnosis not present

## 2018-10-12 DIAGNOSIS — H5440 Blindness, one eye, unspecified eye: Secondary | ICD-10-CM | POA: Diagnosis not present

## 2018-10-12 DIAGNOSIS — E1159 Type 2 diabetes mellitus with other circulatory complications: Secondary | ICD-10-CM | POA: Diagnosis not present

## 2018-10-12 DIAGNOSIS — I639 Cerebral infarction, unspecified: Secondary | ICD-10-CM | POA: Diagnosis not present

## 2018-10-13 DIAGNOSIS — I639 Cerebral infarction, unspecified: Secondary | ICD-10-CM | POA: Diagnosis not present

## 2018-10-13 DIAGNOSIS — E1159 Type 2 diabetes mellitus with other circulatory complications: Secondary | ICD-10-CM | POA: Diagnosis not present

## 2018-10-13 DIAGNOSIS — H5440 Blindness, one eye, unspecified eye: Secondary | ICD-10-CM | POA: Diagnosis not present

## 2018-10-14 DIAGNOSIS — E1159 Type 2 diabetes mellitus with other circulatory complications: Secondary | ICD-10-CM | POA: Diagnosis not present

## 2018-10-14 DIAGNOSIS — I639 Cerebral infarction, unspecified: Secondary | ICD-10-CM | POA: Diagnosis not present

## 2018-10-14 DIAGNOSIS — H5440 Blindness, one eye, unspecified eye: Secondary | ICD-10-CM | POA: Diagnosis not present

## 2018-10-15 DIAGNOSIS — I639 Cerebral infarction, unspecified: Secondary | ICD-10-CM | POA: Diagnosis not present

## 2018-10-15 DIAGNOSIS — H5440 Blindness, one eye, unspecified eye: Secondary | ICD-10-CM | POA: Diagnosis not present

## 2018-10-15 DIAGNOSIS — E1159 Type 2 diabetes mellitus with other circulatory complications: Secondary | ICD-10-CM | POA: Diagnosis not present

## 2018-10-16 DIAGNOSIS — H5440 Blindness, one eye, unspecified eye: Secondary | ICD-10-CM | POA: Diagnosis not present

## 2018-10-16 DIAGNOSIS — I639 Cerebral infarction, unspecified: Secondary | ICD-10-CM | POA: Diagnosis not present

## 2018-10-16 DIAGNOSIS — E1159 Type 2 diabetes mellitus with other circulatory complications: Secondary | ICD-10-CM | POA: Diagnosis not present

## 2018-10-17 DIAGNOSIS — I639 Cerebral infarction, unspecified: Secondary | ICD-10-CM | POA: Diagnosis not present

## 2018-10-17 DIAGNOSIS — H5440 Blindness, one eye, unspecified eye: Secondary | ICD-10-CM | POA: Diagnosis not present

## 2018-10-17 DIAGNOSIS — E1159 Type 2 diabetes mellitus with other circulatory complications: Secondary | ICD-10-CM | POA: Diagnosis not present

## 2018-10-18 DIAGNOSIS — I639 Cerebral infarction, unspecified: Secondary | ICD-10-CM | POA: Diagnosis not present

## 2018-10-18 DIAGNOSIS — H5440 Blindness, one eye, unspecified eye: Secondary | ICD-10-CM | POA: Diagnosis not present

## 2018-10-18 DIAGNOSIS — E1159 Type 2 diabetes mellitus with other circulatory complications: Secondary | ICD-10-CM | POA: Diagnosis not present

## 2018-10-19 DIAGNOSIS — H5440 Blindness, one eye, unspecified eye: Secondary | ICD-10-CM | POA: Diagnosis not present

## 2018-10-19 DIAGNOSIS — E1159 Type 2 diabetes mellitus with other circulatory complications: Secondary | ICD-10-CM | POA: Diagnosis not present

## 2018-10-19 DIAGNOSIS — I639 Cerebral infarction, unspecified: Secondary | ICD-10-CM | POA: Diagnosis not present

## 2018-10-20 DIAGNOSIS — H5440 Blindness, one eye, unspecified eye: Secondary | ICD-10-CM | POA: Diagnosis not present

## 2018-10-20 DIAGNOSIS — I639 Cerebral infarction, unspecified: Secondary | ICD-10-CM | POA: Diagnosis not present

## 2018-10-20 DIAGNOSIS — E1159 Type 2 diabetes mellitus with other circulatory complications: Secondary | ICD-10-CM | POA: Diagnosis not present

## 2018-10-21 DIAGNOSIS — E1159 Type 2 diabetes mellitus with other circulatory complications: Secondary | ICD-10-CM | POA: Diagnosis not present

## 2018-10-21 DIAGNOSIS — H5440 Blindness, one eye, unspecified eye: Secondary | ICD-10-CM | POA: Diagnosis not present

## 2018-10-21 DIAGNOSIS — I639 Cerebral infarction, unspecified: Secondary | ICD-10-CM | POA: Diagnosis not present

## 2018-10-22 DIAGNOSIS — E1159 Type 2 diabetes mellitus with other circulatory complications: Secondary | ICD-10-CM | POA: Diagnosis not present

## 2018-10-22 DIAGNOSIS — H5440 Blindness, one eye, unspecified eye: Secondary | ICD-10-CM | POA: Diagnosis not present

## 2018-10-22 DIAGNOSIS — I639 Cerebral infarction, unspecified: Secondary | ICD-10-CM | POA: Diagnosis not present

## 2018-10-23 DIAGNOSIS — E1159 Type 2 diabetes mellitus with other circulatory complications: Secondary | ICD-10-CM | POA: Diagnosis not present

## 2018-10-23 DIAGNOSIS — I639 Cerebral infarction, unspecified: Secondary | ICD-10-CM | POA: Diagnosis not present

## 2018-10-23 DIAGNOSIS — H5440 Blindness, one eye, unspecified eye: Secondary | ICD-10-CM | POA: Diagnosis not present

## 2018-10-24 DIAGNOSIS — H5440 Blindness, one eye, unspecified eye: Secondary | ICD-10-CM | POA: Diagnosis not present

## 2018-10-24 DIAGNOSIS — E1159 Type 2 diabetes mellitus with other circulatory complications: Secondary | ICD-10-CM | POA: Diagnosis not present

## 2018-10-24 DIAGNOSIS — I639 Cerebral infarction, unspecified: Secondary | ICD-10-CM | POA: Diagnosis not present

## 2018-10-25 DIAGNOSIS — I639 Cerebral infarction, unspecified: Secondary | ICD-10-CM | POA: Diagnosis not present

## 2018-10-25 DIAGNOSIS — E1159 Type 2 diabetes mellitus with other circulatory complications: Secondary | ICD-10-CM | POA: Diagnosis not present

## 2018-10-25 DIAGNOSIS — H5440 Blindness, one eye, unspecified eye: Secondary | ICD-10-CM | POA: Diagnosis not present

## 2018-10-26 DIAGNOSIS — I639 Cerebral infarction, unspecified: Secondary | ICD-10-CM | POA: Diagnosis not present

## 2018-10-26 DIAGNOSIS — E1159 Type 2 diabetes mellitus with other circulatory complications: Secondary | ICD-10-CM | POA: Diagnosis not present

## 2018-10-26 DIAGNOSIS — H5440 Blindness, one eye, unspecified eye: Secondary | ICD-10-CM | POA: Diagnosis not present

## 2018-10-27 DIAGNOSIS — E1159 Type 2 diabetes mellitus with other circulatory complications: Secondary | ICD-10-CM | POA: Diagnosis not present

## 2018-10-27 DIAGNOSIS — I639 Cerebral infarction, unspecified: Secondary | ICD-10-CM | POA: Diagnosis not present

## 2018-10-27 DIAGNOSIS — H5440 Blindness, one eye, unspecified eye: Secondary | ICD-10-CM | POA: Diagnosis not present

## 2018-10-28 DIAGNOSIS — E1159 Type 2 diabetes mellitus with other circulatory complications: Secondary | ICD-10-CM | POA: Diagnosis not present

## 2018-10-28 DIAGNOSIS — I639 Cerebral infarction, unspecified: Secondary | ICD-10-CM | POA: Diagnosis not present

## 2018-10-28 DIAGNOSIS — H5440 Blindness, one eye, unspecified eye: Secondary | ICD-10-CM | POA: Diagnosis not present

## 2018-10-29 DIAGNOSIS — H5440 Blindness, one eye, unspecified eye: Secondary | ICD-10-CM | POA: Diagnosis not present

## 2018-10-29 DIAGNOSIS — E1159 Type 2 diabetes mellitus with other circulatory complications: Secondary | ICD-10-CM | POA: Diagnosis not present

## 2018-10-29 DIAGNOSIS — I639 Cerebral infarction, unspecified: Secondary | ICD-10-CM | POA: Diagnosis not present

## 2018-10-30 DIAGNOSIS — I639 Cerebral infarction, unspecified: Secondary | ICD-10-CM | POA: Diagnosis not present

## 2018-10-30 DIAGNOSIS — H5440 Blindness, one eye, unspecified eye: Secondary | ICD-10-CM | POA: Diagnosis not present

## 2018-10-30 DIAGNOSIS — E1159 Type 2 diabetes mellitus with other circulatory complications: Secondary | ICD-10-CM | POA: Diagnosis not present

## 2018-10-31 DIAGNOSIS — E1159 Type 2 diabetes mellitus with other circulatory complications: Secondary | ICD-10-CM | POA: Diagnosis not present

## 2018-10-31 DIAGNOSIS — I639 Cerebral infarction, unspecified: Secondary | ICD-10-CM | POA: Diagnosis not present

## 2018-10-31 DIAGNOSIS — H5440 Blindness, one eye, unspecified eye: Secondary | ICD-10-CM | POA: Diagnosis not present

## 2018-11-01 DIAGNOSIS — I639 Cerebral infarction, unspecified: Secondary | ICD-10-CM | POA: Diagnosis not present

## 2018-11-01 DIAGNOSIS — H5440 Blindness, one eye, unspecified eye: Secondary | ICD-10-CM | POA: Diagnosis not present

## 2018-11-01 DIAGNOSIS — E1159 Type 2 diabetes mellitus with other circulatory complications: Secondary | ICD-10-CM | POA: Diagnosis not present

## 2018-11-02 DIAGNOSIS — H5440 Blindness, one eye, unspecified eye: Secondary | ICD-10-CM | POA: Diagnosis not present

## 2018-11-02 DIAGNOSIS — I639 Cerebral infarction, unspecified: Secondary | ICD-10-CM | POA: Diagnosis not present

## 2018-11-02 DIAGNOSIS — E1159 Type 2 diabetes mellitus with other circulatory complications: Secondary | ICD-10-CM | POA: Diagnosis not present

## 2018-11-03 DIAGNOSIS — E1159 Type 2 diabetes mellitus with other circulatory complications: Secondary | ICD-10-CM | POA: Diagnosis not present

## 2018-11-03 DIAGNOSIS — I639 Cerebral infarction, unspecified: Secondary | ICD-10-CM | POA: Diagnosis not present

## 2018-11-03 DIAGNOSIS — H5440 Blindness, one eye, unspecified eye: Secondary | ICD-10-CM | POA: Diagnosis not present

## 2018-11-04 DIAGNOSIS — E1159 Type 2 diabetes mellitus with other circulatory complications: Secondary | ICD-10-CM | POA: Diagnosis not present

## 2018-11-04 DIAGNOSIS — I639 Cerebral infarction, unspecified: Secondary | ICD-10-CM | POA: Diagnosis not present

## 2018-11-04 DIAGNOSIS — H5440 Blindness, one eye, unspecified eye: Secondary | ICD-10-CM | POA: Diagnosis not present

## 2018-11-05 DIAGNOSIS — I639 Cerebral infarction, unspecified: Secondary | ICD-10-CM | POA: Diagnosis not present

## 2018-11-05 DIAGNOSIS — E1159 Type 2 diabetes mellitus with other circulatory complications: Secondary | ICD-10-CM | POA: Diagnosis not present

## 2018-11-05 DIAGNOSIS — H5440 Blindness, one eye, unspecified eye: Secondary | ICD-10-CM | POA: Diagnosis not present

## 2018-11-06 DIAGNOSIS — I639 Cerebral infarction, unspecified: Secondary | ICD-10-CM | POA: Diagnosis not present

## 2018-11-06 DIAGNOSIS — E1159 Type 2 diabetes mellitus with other circulatory complications: Secondary | ICD-10-CM | POA: Diagnosis not present

## 2018-11-06 DIAGNOSIS — H5440 Blindness, one eye, unspecified eye: Secondary | ICD-10-CM | POA: Diagnosis not present

## 2018-11-07 DIAGNOSIS — E1159 Type 2 diabetes mellitus with other circulatory complications: Secondary | ICD-10-CM | POA: Diagnosis not present

## 2018-11-07 DIAGNOSIS — H5440 Blindness, one eye, unspecified eye: Secondary | ICD-10-CM | POA: Diagnosis not present

## 2018-11-07 DIAGNOSIS — I639 Cerebral infarction, unspecified: Secondary | ICD-10-CM | POA: Diagnosis not present

## 2018-11-08 DIAGNOSIS — E1159 Type 2 diabetes mellitus with other circulatory complications: Secondary | ICD-10-CM | POA: Diagnosis not present

## 2018-11-08 DIAGNOSIS — I639 Cerebral infarction, unspecified: Secondary | ICD-10-CM | POA: Diagnosis not present

## 2018-11-08 DIAGNOSIS — H5440 Blindness, one eye, unspecified eye: Secondary | ICD-10-CM | POA: Diagnosis not present

## 2018-11-09 DIAGNOSIS — I639 Cerebral infarction, unspecified: Secondary | ICD-10-CM | POA: Diagnosis not present

## 2018-11-09 DIAGNOSIS — E1159 Type 2 diabetes mellitus with other circulatory complications: Secondary | ICD-10-CM | POA: Diagnosis not present

## 2018-11-09 DIAGNOSIS — H5440 Blindness, one eye, unspecified eye: Secondary | ICD-10-CM | POA: Diagnosis not present

## 2018-11-10 DIAGNOSIS — H5440 Blindness, one eye, unspecified eye: Secondary | ICD-10-CM | POA: Diagnosis not present

## 2018-11-10 DIAGNOSIS — I639 Cerebral infarction, unspecified: Secondary | ICD-10-CM | POA: Diagnosis not present

## 2018-11-10 DIAGNOSIS — E1159 Type 2 diabetes mellitus with other circulatory complications: Secondary | ICD-10-CM | POA: Diagnosis not present

## 2018-11-11 DIAGNOSIS — I639 Cerebral infarction, unspecified: Secondary | ICD-10-CM | POA: Diagnosis not present

## 2018-11-11 DIAGNOSIS — H5440 Blindness, one eye, unspecified eye: Secondary | ICD-10-CM | POA: Diagnosis not present

## 2018-11-11 DIAGNOSIS — E1159 Type 2 diabetes mellitus with other circulatory complications: Secondary | ICD-10-CM | POA: Diagnosis not present

## 2018-11-12 DIAGNOSIS — H5440 Blindness, one eye, unspecified eye: Secondary | ICD-10-CM | POA: Diagnosis not present

## 2018-11-12 DIAGNOSIS — E1159 Type 2 diabetes mellitus with other circulatory complications: Secondary | ICD-10-CM | POA: Diagnosis not present

## 2018-11-12 DIAGNOSIS — I639 Cerebral infarction, unspecified: Secondary | ICD-10-CM | POA: Diagnosis not present

## 2018-11-13 DIAGNOSIS — H5440 Blindness, one eye, unspecified eye: Secondary | ICD-10-CM | POA: Diagnosis not present

## 2018-11-13 DIAGNOSIS — E1159 Type 2 diabetes mellitus with other circulatory complications: Secondary | ICD-10-CM | POA: Diagnosis not present

## 2018-11-13 DIAGNOSIS — I639 Cerebral infarction, unspecified: Secondary | ICD-10-CM | POA: Diagnosis not present

## 2018-11-14 DIAGNOSIS — H5440 Blindness, one eye, unspecified eye: Secondary | ICD-10-CM | POA: Diagnosis not present

## 2018-11-14 DIAGNOSIS — I639 Cerebral infarction, unspecified: Secondary | ICD-10-CM | POA: Diagnosis not present

## 2018-11-14 DIAGNOSIS — E1159 Type 2 diabetes mellitus with other circulatory complications: Secondary | ICD-10-CM | POA: Diagnosis not present

## 2018-11-15 DIAGNOSIS — I639 Cerebral infarction, unspecified: Secondary | ICD-10-CM | POA: Diagnosis not present

## 2018-11-15 DIAGNOSIS — E1159 Type 2 diabetes mellitus with other circulatory complications: Secondary | ICD-10-CM | POA: Diagnosis not present

## 2018-11-15 DIAGNOSIS — H5440 Blindness, one eye, unspecified eye: Secondary | ICD-10-CM | POA: Diagnosis not present

## 2018-11-16 DIAGNOSIS — H5440 Blindness, one eye, unspecified eye: Secondary | ICD-10-CM | POA: Diagnosis not present

## 2018-11-16 DIAGNOSIS — I639 Cerebral infarction, unspecified: Secondary | ICD-10-CM | POA: Diagnosis not present

## 2018-11-16 DIAGNOSIS — E1159 Type 2 diabetes mellitus with other circulatory complications: Secondary | ICD-10-CM | POA: Diagnosis not present

## 2018-11-17 DIAGNOSIS — E1159 Type 2 diabetes mellitus with other circulatory complications: Secondary | ICD-10-CM | POA: Diagnosis not present

## 2018-11-17 DIAGNOSIS — I639 Cerebral infarction, unspecified: Secondary | ICD-10-CM | POA: Diagnosis not present

## 2018-11-17 DIAGNOSIS — H5440 Blindness, one eye, unspecified eye: Secondary | ICD-10-CM | POA: Diagnosis not present

## 2018-11-18 DIAGNOSIS — I639 Cerebral infarction, unspecified: Secondary | ICD-10-CM | POA: Diagnosis not present

## 2018-11-18 DIAGNOSIS — H5440 Blindness, one eye, unspecified eye: Secondary | ICD-10-CM | POA: Diagnosis not present

## 2018-11-18 DIAGNOSIS — E1159 Type 2 diabetes mellitus with other circulatory complications: Secondary | ICD-10-CM | POA: Diagnosis not present

## 2018-11-19 DIAGNOSIS — H5440 Blindness, one eye, unspecified eye: Secondary | ICD-10-CM | POA: Diagnosis not present

## 2018-11-19 DIAGNOSIS — I639 Cerebral infarction, unspecified: Secondary | ICD-10-CM | POA: Diagnosis not present

## 2018-11-19 DIAGNOSIS — E1159 Type 2 diabetes mellitus with other circulatory complications: Secondary | ICD-10-CM | POA: Diagnosis not present

## 2018-11-20 DIAGNOSIS — E1159 Type 2 diabetes mellitus with other circulatory complications: Secondary | ICD-10-CM | POA: Diagnosis not present

## 2018-11-20 DIAGNOSIS — I639 Cerebral infarction, unspecified: Secondary | ICD-10-CM | POA: Diagnosis not present

## 2018-11-20 DIAGNOSIS — H5440 Blindness, one eye, unspecified eye: Secondary | ICD-10-CM | POA: Diagnosis not present

## 2018-11-21 DIAGNOSIS — H5440 Blindness, one eye, unspecified eye: Secondary | ICD-10-CM | POA: Diagnosis not present

## 2018-11-21 DIAGNOSIS — E1159 Type 2 diabetes mellitus with other circulatory complications: Secondary | ICD-10-CM | POA: Diagnosis not present

## 2018-11-21 DIAGNOSIS — I639 Cerebral infarction, unspecified: Secondary | ICD-10-CM | POA: Diagnosis not present

## 2018-11-22 DIAGNOSIS — H5440 Blindness, one eye, unspecified eye: Secondary | ICD-10-CM | POA: Diagnosis not present

## 2018-11-22 DIAGNOSIS — I639 Cerebral infarction, unspecified: Secondary | ICD-10-CM | POA: Diagnosis not present

## 2018-11-22 DIAGNOSIS — E1159 Type 2 diabetes mellitus with other circulatory complications: Secondary | ICD-10-CM | POA: Diagnosis not present

## 2018-11-23 DIAGNOSIS — E1159 Type 2 diabetes mellitus with other circulatory complications: Secondary | ICD-10-CM | POA: Diagnosis not present

## 2018-11-23 DIAGNOSIS — H5440 Blindness, one eye, unspecified eye: Secondary | ICD-10-CM | POA: Diagnosis not present

## 2018-11-23 DIAGNOSIS — I639 Cerebral infarction, unspecified: Secondary | ICD-10-CM | POA: Diagnosis not present

## 2018-11-24 DIAGNOSIS — H5440 Blindness, one eye, unspecified eye: Secondary | ICD-10-CM | POA: Diagnosis not present

## 2018-11-24 DIAGNOSIS — I639 Cerebral infarction, unspecified: Secondary | ICD-10-CM | POA: Diagnosis not present

## 2018-11-24 DIAGNOSIS — E1159 Type 2 diabetes mellitus with other circulatory complications: Secondary | ICD-10-CM | POA: Diagnosis not present

## 2018-11-25 DIAGNOSIS — H5440 Blindness, one eye, unspecified eye: Secondary | ICD-10-CM | POA: Diagnosis not present

## 2018-11-25 DIAGNOSIS — E1159 Type 2 diabetes mellitus with other circulatory complications: Secondary | ICD-10-CM | POA: Diagnosis not present

## 2018-11-25 DIAGNOSIS — I639 Cerebral infarction, unspecified: Secondary | ICD-10-CM | POA: Diagnosis not present

## 2018-11-26 DIAGNOSIS — H5440 Blindness, one eye, unspecified eye: Secondary | ICD-10-CM | POA: Diagnosis not present

## 2018-11-26 DIAGNOSIS — I639 Cerebral infarction, unspecified: Secondary | ICD-10-CM | POA: Diagnosis not present

## 2018-11-26 DIAGNOSIS — E1159 Type 2 diabetes mellitus with other circulatory complications: Secondary | ICD-10-CM | POA: Diagnosis not present

## 2018-11-27 DIAGNOSIS — H5440 Blindness, one eye, unspecified eye: Secondary | ICD-10-CM | POA: Diagnosis not present

## 2018-11-27 DIAGNOSIS — I639 Cerebral infarction, unspecified: Secondary | ICD-10-CM | POA: Diagnosis not present

## 2018-11-27 DIAGNOSIS — E1159 Type 2 diabetes mellitus with other circulatory complications: Secondary | ICD-10-CM | POA: Diagnosis not present

## 2018-11-28 DIAGNOSIS — I639 Cerebral infarction, unspecified: Secondary | ICD-10-CM | POA: Diagnosis not present

## 2018-11-28 DIAGNOSIS — E1159 Type 2 diabetes mellitus with other circulatory complications: Secondary | ICD-10-CM | POA: Diagnosis not present

## 2018-11-28 DIAGNOSIS — H5440 Blindness, one eye, unspecified eye: Secondary | ICD-10-CM | POA: Diagnosis not present

## 2018-11-29 DIAGNOSIS — H5440 Blindness, one eye, unspecified eye: Secondary | ICD-10-CM | POA: Diagnosis not present

## 2018-11-29 DIAGNOSIS — I639 Cerebral infarction, unspecified: Secondary | ICD-10-CM | POA: Diagnosis not present

## 2018-11-29 DIAGNOSIS — E1159 Type 2 diabetes mellitus with other circulatory complications: Secondary | ICD-10-CM | POA: Diagnosis not present

## 2018-11-29 MED FILL — LANTUS 100 UNITS/ML VIAL: 100 | 28 days supply | Qty: 10 | Fill #1

## 2018-11-29 MED FILL — LISINOPRIL 20 MG TAB: 20 | 30 days supply | Qty: 30 | Fill #1

## 2018-11-29 MED FILL — LISINOPRIL 20 MG TABLET: 20 | 30 days supply | Qty: 30 | Fill #1

## 2018-11-30 DIAGNOSIS — H5440 Blindness, one eye, unspecified eye: Secondary | ICD-10-CM | POA: Diagnosis not present

## 2018-11-30 DIAGNOSIS — E1159 Type 2 diabetes mellitus with other circulatory complications: Secondary | ICD-10-CM | POA: Diagnosis not present

## 2018-11-30 DIAGNOSIS — I639 Cerebral infarction, unspecified: Secondary | ICD-10-CM | POA: Diagnosis not present

## 2018-12-01 DIAGNOSIS — H5440 Blindness, one eye, unspecified eye: Secondary | ICD-10-CM | POA: Diagnosis not present

## 2018-12-01 DIAGNOSIS — E1159 Type 2 diabetes mellitus with other circulatory complications: Secondary | ICD-10-CM | POA: Diagnosis not present

## 2018-12-01 DIAGNOSIS — I639 Cerebral infarction, unspecified: Secondary | ICD-10-CM | POA: Diagnosis not present

## 2018-12-02 DIAGNOSIS — I639 Cerebral infarction, unspecified: Secondary | ICD-10-CM | POA: Diagnosis not present

## 2018-12-02 DIAGNOSIS — E1159 Type 2 diabetes mellitus with other circulatory complications: Secondary | ICD-10-CM | POA: Diagnosis not present

## 2018-12-02 DIAGNOSIS — H5440 Blindness, one eye, unspecified eye: Secondary | ICD-10-CM | POA: Diagnosis not present

## 2018-12-03 DIAGNOSIS — I639 Cerebral infarction, unspecified: Secondary | ICD-10-CM | POA: Diagnosis not present

## 2018-12-03 DIAGNOSIS — H5440 Blindness, one eye, unspecified eye: Secondary | ICD-10-CM | POA: Diagnosis not present

## 2018-12-03 DIAGNOSIS — E1159 Type 2 diabetes mellitus with other circulatory complications: Secondary | ICD-10-CM | POA: Diagnosis not present

## 2018-12-04 DIAGNOSIS — E1159 Type 2 diabetes mellitus with other circulatory complications: Secondary | ICD-10-CM | POA: Diagnosis not present

## 2018-12-04 DIAGNOSIS — H5440 Blindness, one eye, unspecified eye: Secondary | ICD-10-CM | POA: Diagnosis not present

## 2018-12-04 DIAGNOSIS — I639 Cerebral infarction, unspecified: Secondary | ICD-10-CM | POA: Diagnosis not present

## 2018-12-05 DIAGNOSIS — H5440 Blindness, one eye, unspecified eye: Secondary | ICD-10-CM | POA: Diagnosis not present

## 2018-12-05 DIAGNOSIS — E1159 Type 2 diabetes mellitus with other circulatory complications: Secondary | ICD-10-CM | POA: Diagnosis not present

## 2018-12-05 DIAGNOSIS — I639 Cerebral infarction, unspecified: Secondary | ICD-10-CM | POA: Diagnosis not present

## 2018-12-06 DIAGNOSIS — I639 Cerebral infarction, unspecified: Secondary | ICD-10-CM | POA: Diagnosis not present

## 2018-12-06 DIAGNOSIS — E1159 Type 2 diabetes mellitus with other circulatory complications: Secondary | ICD-10-CM | POA: Diagnosis not present

## 2018-12-06 DIAGNOSIS — H5440 Blindness, one eye, unspecified eye: Secondary | ICD-10-CM | POA: Diagnosis not present

## 2018-12-07 DIAGNOSIS — I639 Cerebral infarction, unspecified: Secondary | ICD-10-CM | POA: Diagnosis not present

## 2018-12-07 DIAGNOSIS — H5440 Blindness, one eye, unspecified eye: Secondary | ICD-10-CM | POA: Diagnosis not present

## 2018-12-07 DIAGNOSIS — E1159 Type 2 diabetes mellitus with other circulatory complications: Secondary | ICD-10-CM | POA: Diagnosis not present

## 2018-12-08 DIAGNOSIS — E1159 Type 2 diabetes mellitus with other circulatory complications: Secondary | ICD-10-CM | POA: Diagnosis not present

## 2018-12-08 DIAGNOSIS — I639 Cerebral infarction, unspecified: Secondary | ICD-10-CM | POA: Diagnosis not present

## 2018-12-08 DIAGNOSIS — H5440 Blindness, one eye, unspecified eye: Secondary | ICD-10-CM | POA: Diagnosis not present

## 2018-12-09 DIAGNOSIS — E1159 Type 2 diabetes mellitus with other circulatory complications: Secondary | ICD-10-CM | POA: Diagnosis not present

## 2018-12-09 DIAGNOSIS — I639 Cerebral infarction, unspecified: Secondary | ICD-10-CM | POA: Diagnosis not present

## 2018-12-09 DIAGNOSIS — H5440 Blindness, one eye, unspecified eye: Secondary | ICD-10-CM | POA: Diagnosis not present

## 2018-12-10 DIAGNOSIS — E1159 Type 2 diabetes mellitus with other circulatory complications: Secondary | ICD-10-CM | POA: Diagnosis not present

## 2018-12-10 DIAGNOSIS — I639 Cerebral infarction, unspecified: Secondary | ICD-10-CM | POA: Diagnosis not present

## 2018-12-10 DIAGNOSIS — H5440 Blindness, one eye, unspecified eye: Secondary | ICD-10-CM | POA: Diagnosis not present

## 2018-12-11 DIAGNOSIS — I639 Cerebral infarction, unspecified: Secondary | ICD-10-CM | POA: Diagnosis not present

## 2018-12-11 DIAGNOSIS — H5440 Blindness, one eye, unspecified eye: Secondary | ICD-10-CM | POA: Diagnosis not present

## 2018-12-11 DIAGNOSIS — E1159 Type 2 diabetes mellitus with other circulatory complications: Secondary | ICD-10-CM | POA: Diagnosis not present

## 2018-12-12 ENCOUNTER — Ambulatory Visit: Payer: Medicaid Other | Admitting: Family Medicine

## 2018-12-12 DIAGNOSIS — H5440 Blindness, one eye, unspecified eye: Secondary | ICD-10-CM | POA: Diagnosis not present

## 2018-12-12 DIAGNOSIS — I639 Cerebral infarction, unspecified: Secondary | ICD-10-CM | POA: Diagnosis not present

## 2018-12-12 DIAGNOSIS — E1159 Type 2 diabetes mellitus with other circulatory complications: Secondary | ICD-10-CM | POA: Diagnosis not present

## 2018-12-13 DIAGNOSIS — I639 Cerebral infarction, unspecified: Secondary | ICD-10-CM | POA: Diagnosis not present

## 2018-12-13 DIAGNOSIS — E1159 Type 2 diabetes mellitus with other circulatory complications: Secondary | ICD-10-CM | POA: Diagnosis not present

## 2018-12-13 DIAGNOSIS — H5440 Blindness, one eye, unspecified eye: Secondary | ICD-10-CM | POA: Diagnosis not present

## 2018-12-14 DIAGNOSIS — I639 Cerebral infarction, unspecified: Secondary | ICD-10-CM | POA: Diagnosis not present

## 2018-12-14 DIAGNOSIS — H5440 Blindness, one eye, unspecified eye: Secondary | ICD-10-CM | POA: Diagnosis not present

## 2018-12-14 DIAGNOSIS — E1159 Type 2 diabetes mellitus with other circulatory complications: Secondary | ICD-10-CM | POA: Diagnosis not present

## 2018-12-15 DIAGNOSIS — E1159 Type 2 diabetes mellitus with other circulatory complications: Secondary | ICD-10-CM | POA: Diagnosis not present

## 2018-12-15 DIAGNOSIS — I639 Cerebral infarction, unspecified: Secondary | ICD-10-CM | POA: Diagnosis not present

## 2018-12-15 DIAGNOSIS — H5440 Blindness, one eye, unspecified eye: Secondary | ICD-10-CM | POA: Diagnosis not present

## 2018-12-16 DIAGNOSIS — I639 Cerebral infarction, unspecified: Secondary | ICD-10-CM | POA: Diagnosis not present

## 2018-12-16 DIAGNOSIS — H5440 Blindness, one eye, unspecified eye: Secondary | ICD-10-CM | POA: Diagnosis not present

## 2018-12-16 DIAGNOSIS — E1159 Type 2 diabetes mellitus with other circulatory complications: Secondary | ICD-10-CM | POA: Diagnosis not present

## 2018-12-17 DIAGNOSIS — E1159 Type 2 diabetes mellitus with other circulatory complications: Secondary | ICD-10-CM | POA: Diagnosis not present

## 2018-12-17 DIAGNOSIS — I639 Cerebral infarction, unspecified: Secondary | ICD-10-CM | POA: Diagnosis not present

## 2018-12-17 DIAGNOSIS — H5440 Blindness, one eye, unspecified eye: Secondary | ICD-10-CM | POA: Diagnosis not present

## 2018-12-18 DIAGNOSIS — H5440 Blindness, one eye, unspecified eye: Secondary | ICD-10-CM | POA: Diagnosis not present

## 2018-12-18 DIAGNOSIS — E1159 Type 2 diabetes mellitus with other circulatory complications: Secondary | ICD-10-CM | POA: Diagnosis not present

## 2018-12-18 DIAGNOSIS — I639 Cerebral infarction, unspecified: Secondary | ICD-10-CM | POA: Diagnosis not present

## 2018-12-19 DIAGNOSIS — I639 Cerebral infarction, unspecified: Secondary | ICD-10-CM | POA: Diagnosis not present

## 2018-12-19 DIAGNOSIS — H5440 Blindness, one eye, unspecified eye: Secondary | ICD-10-CM | POA: Diagnosis not present

## 2018-12-19 DIAGNOSIS — E1159 Type 2 diabetes mellitus with other circulatory complications: Secondary | ICD-10-CM | POA: Diagnosis not present

## 2018-12-20 DIAGNOSIS — H5440 Blindness, one eye, unspecified eye: Secondary | ICD-10-CM | POA: Diagnosis not present

## 2018-12-20 DIAGNOSIS — I639 Cerebral infarction, unspecified: Secondary | ICD-10-CM | POA: Diagnosis not present

## 2018-12-20 DIAGNOSIS — E1159 Type 2 diabetes mellitus with other circulatory complications: Secondary | ICD-10-CM | POA: Diagnosis not present

## 2018-12-21 DIAGNOSIS — H5440 Blindness, one eye, unspecified eye: Secondary | ICD-10-CM | POA: Diagnosis not present

## 2018-12-21 DIAGNOSIS — E1159 Type 2 diabetes mellitus with other circulatory complications: Secondary | ICD-10-CM | POA: Diagnosis not present

## 2018-12-21 DIAGNOSIS — I639 Cerebral infarction, unspecified: Secondary | ICD-10-CM | POA: Diagnosis not present

## 2018-12-22 DIAGNOSIS — I639 Cerebral infarction, unspecified: Secondary | ICD-10-CM | POA: Diagnosis not present

## 2018-12-22 DIAGNOSIS — H5440 Blindness, one eye, unspecified eye: Secondary | ICD-10-CM | POA: Diagnosis not present

## 2018-12-22 DIAGNOSIS — E1159 Type 2 diabetes mellitus with other circulatory complications: Secondary | ICD-10-CM | POA: Diagnosis not present

## 2018-12-23 DIAGNOSIS — H5440 Blindness, one eye, unspecified eye: Secondary | ICD-10-CM | POA: Diagnosis not present

## 2018-12-23 DIAGNOSIS — E1159 Type 2 diabetes mellitus with other circulatory complications: Secondary | ICD-10-CM | POA: Diagnosis not present

## 2018-12-23 DIAGNOSIS — I639 Cerebral infarction, unspecified: Secondary | ICD-10-CM | POA: Diagnosis not present

## 2018-12-24 DIAGNOSIS — H5440 Blindness, one eye, unspecified eye: Secondary | ICD-10-CM | POA: Diagnosis not present

## 2018-12-24 DIAGNOSIS — E1159 Type 2 diabetes mellitus with other circulatory complications: Secondary | ICD-10-CM | POA: Diagnosis not present

## 2018-12-24 DIAGNOSIS — I639 Cerebral infarction, unspecified: Secondary | ICD-10-CM | POA: Diagnosis not present

## 2018-12-25 DIAGNOSIS — E1159 Type 2 diabetes mellitus with other circulatory complications: Secondary | ICD-10-CM | POA: Diagnosis not present

## 2018-12-25 DIAGNOSIS — I639 Cerebral infarction, unspecified: Secondary | ICD-10-CM | POA: Diagnosis not present

## 2018-12-25 DIAGNOSIS — H5440 Blindness, one eye, unspecified eye: Secondary | ICD-10-CM | POA: Diagnosis not present

## 2018-12-26 DIAGNOSIS — I639 Cerebral infarction, unspecified: Secondary | ICD-10-CM | POA: Diagnosis not present

## 2018-12-26 DIAGNOSIS — E1159 Type 2 diabetes mellitus with other circulatory complications: Secondary | ICD-10-CM | POA: Diagnosis not present

## 2018-12-26 DIAGNOSIS — H5440 Blindness, one eye, unspecified eye: Secondary | ICD-10-CM | POA: Diagnosis not present

## 2018-12-27 DIAGNOSIS — H5440 Blindness, one eye, unspecified eye: Secondary | ICD-10-CM | POA: Diagnosis not present

## 2018-12-27 DIAGNOSIS — I639 Cerebral infarction, unspecified: Secondary | ICD-10-CM | POA: Diagnosis not present

## 2018-12-27 DIAGNOSIS — E1159 Type 2 diabetes mellitus with other circulatory complications: Secondary | ICD-10-CM | POA: Diagnosis not present

## 2018-12-28 DIAGNOSIS — H5440 Blindness, one eye, unspecified eye: Secondary | ICD-10-CM | POA: Diagnosis not present

## 2018-12-28 DIAGNOSIS — E1159 Type 2 diabetes mellitus with other circulatory complications: Secondary | ICD-10-CM | POA: Diagnosis not present

## 2018-12-28 DIAGNOSIS — I639 Cerebral infarction, unspecified: Secondary | ICD-10-CM | POA: Diagnosis not present

## 2018-12-29 DIAGNOSIS — E1159 Type 2 diabetes mellitus with other circulatory complications: Secondary | ICD-10-CM | POA: Diagnosis not present

## 2018-12-29 DIAGNOSIS — H5440 Blindness, one eye, unspecified eye: Secondary | ICD-10-CM | POA: Diagnosis not present

## 2018-12-29 DIAGNOSIS — I639 Cerebral infarction, unspecified: Secondary | ICD-10-CM | POA: Diagnosis not present

## 2018-12-30 DIAGNOSIS — H5440 Blindness, one eye, unspecified eye: Secondary | ICD-10-CM | POA: Diagnosis not present

## 2018-12-30 DIAGNOSIS — I639 Cerebral infarction, unspecified: Secondary | ICD-10-CM | POA: Diagnosis not present

## 2018-12-30 DIAGNOSIS — E1159 Type 2 diabetes mellitus with other circulatory complications: Secondary | ICD-10-CM | POA: Diagnosis not present

## 2018-12-31 DIAGNOSIS — H5440 Blindness, one eye, unspecified eye: Secondary | ICD-10-CM | POA: Diagnosis not present

## 2018-12-31 DIAGNOSIS — I639 Cerebral infarction, unspecified: Secondary | ICD-10-CM | POA: Diagnosis not present

## 2018-12-31 DIAGNOSIS — E1159 Type 2 diabetes mellitus with other circulatory complications: Secondary | ICD-10-CM | POA: Diagnosis not present

## 2019-01-01 DIAGNOSIS — H5440 Blindness, one eye, unspecified eye: Secondary | ICD-10-CM | POA: Diagnosis not present

## 2019-01-01 DIAGNOSIS — I639 Cerebral infarction, unspecified: Secondary | ICD-10-CM | POA: Diagnosis not present

## 2019-01-01 DIAGNOSIS — E1159 Type 2 diabetes mellitus with other circulatory complications: Secondary | ICD-10-CM | POA: Diagnosis not present

## 2019-01-02 DIAGNOSIS — H5440 Blindness, one eye, unspecified eye: Secondary | ICD-10-CM | POA: Diagnosis not present

## 2019-01-02 DIAGNOSIS — I639 Cerebral infarction, unspecified: Secondary | ICD-10-CM | POA: Diagnosis not present

## 2019-01-02 DIAGNOSIS — E1159 Type 2 diabetes mellitus with other circulatory complications: Secondary | ICD-10-CM | POA: Diagnosis not present

## 2019-01-03 DIAGNOSIS — E1159 Type 2 diabetes mellitus with other circulatory complications: Secondary | ICD-10-CM | POA: Diagnosis not present

## 2019-01-03 DIAGNOSIS — I639 Cerebral infarction, unspecified: Secondary | ICD-10-CM | POA: Diagnosis not present

## 2019-01-03 DIAGNOSIS — H5440 Blindness, one eye, unspecified eye: Secondary | ICD-10-CM | POA: Diagnosis not present

## 2019-01-04 ENCOUNTER — Encounter (HOSPITAL_COMMUNITY): Payer: Self-pay

## 2019-01-04 ENCOUNTER — Encounter (HOSPITAL_COMMUNITY): Payer: Self-pay | Admitting: *Deleted

## 2019-01-04 DIAGNOSIS — I639 Cerebral infarction, unspecified: Secondary | ICD-10-CM | POA: Diagnosis not present

## 2019-01-04 DIAGNOSIS — E1159 Type 2 diabetes mellitus with other circulatory complications: Secondary | ICD-10-CM | POA: Diagnosis not present

## 2019-01-04 DIAGNOSIS — H5440 Blindness, one eye, unspecified eye: Secondary | ICD-10-CM | POA: Diagnosis not present

## 2019-01-05 DIAGNOSIS — H5440 Blindness, one eye, unspecified eye: Secondary | ICD-10-CM | POA: Diagnosis not present

## 2019-01-05 DIAGNOSIS — I639 Cerebral infarction, unspecified: Secondary | ICD-10-CM | POA: Diagnosis not present

## 2019-01-05 DIAGNOSIS — E1159 Type 2 diabetes mellitus with other circulatory complications: Secondary | ICD-10-CM | POA: Diagnosis not present

## 2019-01-06 DIAGNOSIS — I639 Cerebral infarction, unspecified: Secondary | ICD-10-CM | POA: Diagnosis not present

## 2019-01-06 DIAGNOSIS — E1159 Type 2 diabetes mellitus with other circulatory complications: Secondary | ICD-10-CM | POA: Diagnosis not present

## 2019-01-06 DIAGNOSIS — H5440 Blindness, one eye, unspecified eye: Secondary | ICD-10-CM | POA: Diagnosis not present

## 2019-01-07 DIAGNOSIS — E1159 Type 2 diabetes mellitus with other circulatory complications: Secondary | ICD-10-CM | POA: Diagnosis not present

## 2019-01-07 DIAGNOSIS — H5440 Blindness, one eye, unspecified eye: Secondary | ICD-10-CM | POA: Diagnosis not present

## 2019-01-07 DIAGNOSIS — I639 Cerebral infarction, unspecified: Secondary | ICD-10-CM | POA: Diagnosis not present

## 2019-01-08 DIAGNOSIS — I639 Cerebral infarction, unspecified: Secondary | ICD-10-CM | POA: Diagnosis not present

## 2019-01-08 DIAGNOSIS — H5440 Blindness, one eye, unspecified eye: Secondary | ICD-10-CM | POA: Diagnosis not present

## 2019-01-08 DIAGNOSIS — E1159 Type 2 diabetes mellitus with other circulatory complications: Secondary | ICD-10-CM | POA: Diagnosis not present

## 2019-01-09 DIAGNOSIS — H5440 Blindness, one eye, unspecified eye: Secondary | ICD-10-CM | POA: Diagnosis not present

## 2019-01-09 DIAGNOSIS — I639 Cerebral infarction, unspecified: Secondary | ICD-10-CM | POA: Diagnosis not present

## 2019-01-09 DIAGNOSIS — E1159 Type 2 diabetes mellitus with other circulatory complications: Secondary | ICD-10-CM | POA: Diagnosis not present

## 2019-01-10 DIAGNOSIS — I639 Cerebral infarction, unspecified: Secondary | ICD-10-CM | POA: Diagnosis not present

## 2019-01-10 DIAGNOSIS — E1159 Type 2 diabetes mellitus with other circulatory complications: Secondary | ICD-10-CM | POA: Diagnosis not present

## 2019-01-10 DIAGNOSIS — H5440 Blindness, one eye, unspecified eye: Secondary | ICD-10-CM | POA: Diagnosis not present

## 2019-01-11 DIAGNOSIS — E1159 Type 2 diabetes mellitus with other circulatory complications: Secondary | ICD-10-CM | POA: Diagnosis not present

## 2019-01-11 DIAGNOSIS — I639 Cerebral infarction, unspecified: Secondary | ICD-10-CM | POA: Diagnosis not present

## 2019-01-11 DIAGNOSIS — H5440 Blindness, one eye, unspecified eye: Secondary | ICD-10-CM | POA: Diagnosis not present

## 2019-01-12 DIAGNOSIS — H5440 Blindness, one eye, unspecified eye: Secondary | ICD-10-CM | POA: Diagnosis not present

## 2019-01-12 DIAGNOSIS — I639 Cerebral infarction, unspecified: Secondary | ICD-10-CM | POA: Diagnosis not present

## 2019-01-12 DIAGNOSIS — E1159 Type 2 diabetes mellitus with other circulatory complications: Secondary | ICD-10-CM | POA: Diagnosis not present

## 2019-01-13 DIAGNOSIS — E1159 Type 2 diabetes mellitus with other circulatory complications: Secondary | ICD-10-CM | POA: Diagnosis not present

## 2019-01-13 DIAGNOSIS — H5440 Blindness, one eye, unspecified eye: Secondary | ICD-10-CM | POA: Diagnosis not present

## 2019-01-13 DIAGNOSIS — I639 Cerebral infarction, unspecified: Secondary | ICD-10-CM | POA: Diagnosis not present

## 2019-01-14 DIAGNOSIS — I639 Cerebral infarction, unspecified: Secondary | ICD-10-CM | POA: Diagnosis not present

## 2019-01-14 DIAGNOSIS — H5440 Blindness, one eye, unspecified eye: Secondary | ICD-10-CM | POA: Diagnosis not present

## 2019-01-14 DIAGNOSIS — E1159 Type 2 diabetes mellitus with other circulatory complications: Secondary | ICD-10-CM | POA: Diagnosis not present

## 2019-01-15 DIAGNOSIS — E1159 Type 2 diabetes mellitus with other circulatory complications: Secondary | ICD-10-CM | POA: Diagnosis not present

## 2019-01-15 DIAGNOSIS — H5440 Blindness, one eye, unspecified eye: Secondary | ICD-10-CM | POA: Diagnosis not present

## 2019-01-15 DIAGNOSIS — I639 Cerebral infarction, unspecified: Secondary | ICD-10-CM | POA: Diagnosis not present

## 2019-01-25 ENCOUNTER — Ambulatory Visit: Payer: Medicaid Other | Admitting: Podiatry

## 2019-01-30 ENCOUNTER — Other Ambulatory Visit: Payer: Self-pay

## 2019-01-30 ENCOUNTER — Encounter: Payer: Self-pay | Admitting: Podiatry

## 2019-01-30 ENCOUNTER — Ambulatory Visit (INDEPENDENT_AMBULATORY_CARE_PROVIDER_SITE_OTHER): Payer: Medicaid Other | Admitting: Podiatry

## 2019-01-30 DIAGNOSIS — E1140 Type 2 diabetes mellitus with diabetic neuropathy, unspecified: Secondary | ICD-10-CM | POA: Diagnosis not present

## 2019-01-30 DIAGNOSIS — M79675 Pain in left toe(s): Secondary | ICD-10-CM | POA: Diagnosis not present

## 2019-01-30 DIAGNOSIS — B351 Tinea unguium: Secondary | ICD-10-CM

## 2019-01-30 DIAGNOSIS — M79674 Pain in right toe(s): Secondary | ICD-10-CM

## 2019-01-30 DIAGNOSIS — Q828 Other specified congenital malformations of skin: Secondary | ICD-10-CM

## 2019-01-30 DIAGNOSIS — E1149 Type 2 diabetes mellitus with other diabetic neurological complication: Secondary | ICD-10-CM

## 2019-01-30 NOTE — Patient Instructions (Signed)
Corns and Calluses Corns are small areas of thickened skin that occur on the top, sides, or tip of a toe. They contain a cone-shaped core with a point that can press on a nerve below. This causes pain.  Calluses are areas of thickened skin that can occur anywhere on the body, including the hands, fingers, palms, soles of the feet, and heels. Calluses are usually larger than corns. What are the causes? Corns and calluses are caused by rubbing (friction) or pressure, such as from shoes that are too tight or do not fit properly. What increases the risk? Corns are more likely to develop in people who have misshapen toes (toe deformities), such as hammer toes. Calluses can occur with friction to any area of the skin. They are more likely to develop in people who:  Work with their hands.  Wear shoes that fit poorly, are too tight, or are high-heeled.  Have toe deformities. What are the signs or symptoms? Symptoms of a corn or callus include:  A hard growth on the skin.  Pain or tenderness under the skin.  Redness and swelling.  Increased discomfort while wearing tight-fitting shoes, if your feet are affected. If a corn or callus becomes infected, symptoms may include:  Redness and swelling that gets worse.  Pain.  Fluid, blood, or pus draining from the corn or callus. How is this diagnosed? Corns and calluses may be diagnosed based on your symptoms, your medical history, and a physical exam. How is this treated? Treatment for corns and calluses may include:  Removing the cause of the friction or pressure. This may involve: ? Changing your shoes. ? Wearing shoe inserts (orthotics) or other protective layers in your shoes, such as a corn pad. ? Wearing gloves.  Applying medicine to the skin (topical medicine) to help soften skin in the hardened, thickened areas.  Removing layers of dead skin with a file to reduce the size of the corn or callus.  Removing the corn or callus with a  scalpel or laser.  Taking antibiotic medicines, if your corn or callus is infected.  Having surgery, if a toe deformity is the cause. Follow these instructions at home:   Take over-the-counter and prescription medicines only as told by your health care provider.  If you were prescribed an antibiotic, take it as told by your health care provider. Do not stop taking it even if your condition starts to improve.  Wear shoes that fit well. Avoid wearing high-heeled shoes and shoes that are too tight or too loose.  Wear any padding, protective layers, gloves, or orthotics as told by your health care provider.  Soak your hands or feet and then use a file or pumice stone to soften your corn or callus. Do this as told by your health care provider.  Check your corn or callus every day for symptoms of infection. Contact a health care provider if you:  Notice that your symptoms do not improve with treatment.  Have redness or swelling that gets worse.  Notice that your corn or callus becomes painful.  Have fluid, blood, or pus coming from your corn or callus.  Have new symptoms. Summary  Corns are small areas of thickened skin that occur on the top, sides, or tip of a toe.  Calluses are areas of thickened skin that can occur anywhere on the body, including the hands, fingers, palms, and soles of the feet. Calluses are usually larger than corns.  Corns and calluses are caused by   rubbing (friction) or pressure, such as from shoes that are too tight or do not fit properly.  Treatment may include wearing any padding, protective layers, gloves, or orthotics as told by your health care provider. This information is not intended to replace advice given to you by your health care provider. Make sure you discuss any questions you have with your health care provider. Document Released: 01/18/2004 Document Revised: 08/03/2018 Document Reviewed: 02/24/2017 Elsevier Patient Education  2020 [employer]  [employer]   Onychomycosis/Fungal Toenails  WHAT IS IT? An infection that lies within the keratin of your nail plate that is caused by a fungus.  WHY ME? Fungal infections affect all ages, sexes, races, and creeds.  There may be many factors that predispose you to a fungal infection such as age, coexisting medical conditions such as diabetes, or an autoimmune disease; stress, medications, fatigue, genetics, etc.  Bottom line: fungus thrives in a warm, moist environment and your shoes offer such a location.  IS IT CONTAGIOUS? Theoretically, yes.  You do not want to share shoes, nail clippers or files with someone who has fungal toenails.  Walking around barefoot in the same room or sleeping in the same bed is unlikely to transfer the organism.  It is important to realize, however, that fungus can spread easily from one nail to the next on the same foot.  HOW DO WE TREAT THIS?  There are several ways to treat this condition.  Treatment may depend on many factors such as age, medications, pregnancy, liver and kidney conditions, etc.  It is best to ask your doctor which options are available to you.  1. No treatment.   Unlike many other medical concerns, you can live with this condition.  However for many people this can be a painful condition and may lead to ingrown toenails or a bacterial infection.  It is recommended that you keep the nails cut short to help reduce the amount of fungal nail. 2. Topical treatment.  These range from herbal remedies to prescription strength nail lacquers.  About 40-50% effective, topicals require twice daily application for approximately 9 to 12 months or until an entirely new nail has grown out.  The most effective topicals are medical grade medications available through physicians offices. 3. Oral antifungal medications.  With an 80-90% cure rate, the most common oral medication requires 3 to 4 months of therapy and stays in your system for a year as the new nail grows out.   Oral antifungal medications do require blood work to make sure it is a safe drug for you.  A liver function panel will be performed prior to starting the medication and after the first month of treatment.  It is important to have the blood work performed to avoid any harmful side effects.  In general, this medication safe but blood work is required. 4. Laser Therapy.  This treatment is performed by applying a specialized laser to the affected nail plate.  This therapy is noninvasive, fast, and non-painful.  It is not covered by insurance and is therefore, out of pocket.  The results have been very good with a 80-95% cure rate.  The [facility] is the only practice in the area to offer this therapy. 5. Permanent Nail Avulsion.  Removing the entire nail so that a new nail will not grow back. 

## 2019-02-01 DIAGNOSIS — E1159 Type 2 diabetes mellitus with other circulatory complications: Secondary | ICD-10-CM | POA: Diagnosis not present

## 2019-02-01 DIAGNOSIS — I639 Cerebral infarction, unspecified: Secondary | ICD-10-CM | POA: Diagnosis not present

## 2019-02-01 DIAGNOSIS — H5440 Blindness, one eye, unspecified eye: Secondary | ICD-10-CM | POA: Diagnosis not present

## 2019-02-02 DIAGNOSIS — H5440 Blindness, one eye, unspecified eye: Secondary | ICD-10-CM | POA: Diagnosis not present

## 2019-02-02 DIAGNOSIS — I639 Cerebral infarction, unspecified: Secondary | ICD-10-CM | POA: Diagnosis not present

## 2019-02-02 DIAGNOSIS — E1159 Type 2 diabetes mellitus with other circulatory complications: Secondary | ICD-10-CM | POA: Diagnosis not present

## 2019-02-02 NOTE — Progress Notes (Signed)
Subjective: Frank Schneider is a 43 y.o. y.o. male who presents for preventative diabetic foot care today with cc of painful, discolored, thick toenails and painful calluses b/l which interfere with daily activities. Pain is aggravated when wearing enclosed shoe gear and relieved with periodic professional debridement.  Lanae Boast, FNP is his PCP.   He continues to smoke daily.   Current Outpatient Medications on File Prior to Visit  Medication Sig Dispense Refill  . amLODipine (NORVASC) 10 MG tablet Take 1 tablet (10 mg total) by mouth daily. 30 tablet 5  . aspirin 325 MG tablet Take 1 tablet (325 mg total) by mouth daily. 30 tablet 0  . atorvastatin (LIPITOR) 40 MG tablet Take 1 tablet (40 mg total) by mouth daily. 30 tablet 11  . atropine 1 % ophthalmic ointment Place 1 application into the right eye daily. 3.5 g 12  . Blood Glucose Monitoring Suppl (TRUE METRIX AIR GLUCOSE METER) DEVI 1 each by Does not apply route 2 (two) times daily at 10 AM and 5 PM. 1 Device 0  . ferrous sulfate 325 (65 FE) MG tablet Take 1 tablet (325 mg total) daily with breakfast by mouth. 30 tablet 3  . glucose blood (TRUE METRIX BLOOD GLUCOSE TEST) test strip Use as instructed 100 each 12  . HYDROcodone-acetaminophen (NORCO) 5-325 MG tablet Take 1 tablet by mouth every 6 (six) hours as needed for severe pain. 10 tablet 0  . insulin glargine (LANTUS) 100 UNIT/ML injection INJECT 30 UNITS INTO THE SKIN AT BEDTIME. 10 mL 11  . Insulin Syringe-Needle U-100 (TRUEPLUS INSULIN SYRINGE) 31G X 5/16" 1 ML MISC USE AS DIRECTED. 100 each 3  . Lancets (FREESTYLE) lancets Use as instructed 100 each 12  . lisinopril (ZESTRIL) 20 MG tablet Take 1 tablet (20 mg total) by mouth daily. 30 tablet 2  . naproxen (NAPROSYN) 500 MG tablet Take 1 tablet (500 mg total) by mouth 2 (two) times daily as needed for mild pain, moderate pain or headache (TAKE WITH MEALS.). 20 tablet 0   No current facility-administered medications on file  prior to visit.     No Known Allergies  Objective:  Vascular Examination: Capillary refill time <3 seconds  x 10 digits.  Dorsalis pedis pulses palpable b/l.  Posterior tibial pulses palpable b/l.  Digital hair sparse b/l.  Skin temperature gradient WNL b/l.  Dermatological Examination: Skin with normal turgor, texture and tone b/l.  Toenails 1-5 b/l discolored, thick, dystrophic with subungual debris and pain with palpation to nailbeds due to thickness of nails.  Porokeratotic lesions submet head 4 left foot, submet head 2 right foot, and b/l hallux with tenderness to palpation. No erythema, no edema, no drainage, no flocculence.   Musculoskeletal: Muscle strength 5/5 to all LE muscle groups.  Hammertoe deformity b/l 2nd digit, rigid.  Neurological: Sensation intact b/l with 10 gram monofilament.  Vibratory sensation intact b/l.  Assessment: 1. Painful onychomycosis toenails 1-5 b/l 2.   Painful porokeratotic lesions submet head 4 right, submet head 2 left and b/l hallux 4.  NIDDM  Plan: 1. Continue diabetic foot care principles. Literature dispensed on today. 2. Toenails 1-5 b/l were debrided in length and girth without iatrogenic bleeding. 3. Porokeratosis submet head 4 right and b/l hallux pared and enucleated with sterile scalpel blade without incident.  4. Porokeratosis submet head 2nd pared and enucleated with sterile scalpel blade. Superficial iatrogenic laceration sustained during paring of lesion. Treated with Lumicain Hemostatic Solution and alcohol. Light dressing applied. Patient  instructed to apply triple antibiotic ointment to lesion once daily for one week. Call office if he has any problems. 5. Patient to continue soft, supportive shoe gear daily. 6. Patient to report any pedal injuries to medical professional immediately. 7. Follow up 3 months.  8. Patient/POA to call should there be a concern in the interim.

## 2019-02-03 DIAGNOSIS — H5440 Blindness, one eye, unspecified eye: Secondary | ICD-10-CM | POA: Diagnosis not present

## 2019-02-03 DIAGNOSIS — E1159 Type 2 diabetes mellitus with other circulatory complications: Secondary | ICD-10-CM | POA: Diagnosis not present

## 2019-02-03 DIAGNOSIS — I639 Cerebral infarction, unspecified: Secondary | ICD-10-CM | POA: Diagnosis not present

## 2019-02-04 DIAGNOSIS — H5440 Blindness, one eye, unspecified eye: Secondary | ICD-10-CM | POA: Diagnosis not present

## 2019-02-04 DIAGNOSIS — E1159 Type 2 diabetes mellitus with other circulatory complications: Secondary | ICD-10-CM | POA: Diagnosis not present

## 2019-02-04 DIAGNOSIS — I639 Cerebral infarction, unspecified: Secondary | ICD-10-CM | POA: Diagnosis not present

## 2019-02-05 DIAGNOSIS — E1159 Type 2 diabetes mellitus with other circulatory complications: Secondary | ICD-10-CM | POA: Diagnosis not present

## 2019-02-05 DIAGNOSIS — H5440 Blindness, one eye, unspecified eye: Secondary | ICD-10-CM | POA: Diagnosis not present

## 2019-02-05 DIAGNOSIS — I639 Cerebral infarction, unspecified: Secondary | ICD-10-CM | POA: Diagnosis not present

## 2019-02-06 DIAGNOSIS — E1159 Type 2 diabetes mellitus with other circulatory complications: Secondary | ICD-10-CM | POA: Diagnosis not present

## 2019-02-06 DIAGNOSIS — I639 Cerebral infarction, unspecified: Secondary | ICD-10-CM | POA: Diagnosis not present

## 2019-02-06 DIAGNOSIS — H5440 Blindness, one eye, unspecified eye: Secondary | ICD-10-CM | POA: Diagnosis not present

## 2019-02-07 DIAGNOSIS — E1159 Type 2 diabetes mellitus with other circulatory complications: Secondary | ICD-10-CM | POA: Diagnosis not present

## 2019-02-07 DIAGNOSIS — H5440 Blindness, one eye, unspecified eye: Secondary | ICD-10-CM | POA: Diagnosis not present

## 2019-02-07 DIAGNOSIS — I639 Cerebral infarction, unspecified: Secondary | ICD-10-CM | POA: Diagnosis not present

## 2019-02-08 DIAGNOSIS — H5440 Blindness, one eye, unspecified eye: Secondary | ICD-10-CM | POA: Diagnosis not present

## 2019-02-08 DIAGNOSIS — I639 Cerebral infarction, unspecified: Secondary | ICD-10-CM | POA: Diagnosis not present

## 2019-02-08 DIAGNOSIS — E1159 Type 2 diabetes mellitus with other circulatory complications: Secondary | ICD-10-CM | POA: Diagnosis not present

## 2019-02-09 DIAGNOSIS — E1159 Type 2 diabetes mellitus with other circulatory complications: Secondary | ICD-10-CM | POA: Diagnosis not present

## 2019-02-09 DIAGNOSIS — I639 Cerebral infarction, unspecified: Secondary | ICD-10-CM | POA: Diagnosis not present

## 2019-02-09 DIAGNOSIS — H5440 Blindness, one eye, unspecified eye: Secondary | ICD-10-CM | POA: Diagnosis not present

## 2019-02-10 DIAGNOSIS — I639 Cerebral infarction, unspecified: Secondary | ICD-10-CM | POA: Diagnosis not present

## 2019-02-10 DIAGNOSIS — E1159 Type 2 diabetes mellitus with other circulatory complications: Secondary | ICD-10-CM | POA: Diagnosis not present

## 2019-02-10 DIAGNOSIS — H5440 Blindness, one eye, unspecified eye: Secondary | ICD-10-CM | POA: Diagnosis not present

## 2019-02-11 DIAGNOSIS — I639 Cerebral infarction, unspecified: Secondary | ICD-10-CM | POA: Diagnosis not present

## 2019-02-11 DIAGNOSIS — E1159 Type 2 diabetes mellitus with other circulatory complications: Secondary | ICD-10-CM | POA: Diagnosis not present

## 2019-02-11 DIAGNOSIS — H5440 Blindness, one eye, unspecified eye: Secondary | ICD-10-CM | POA: Diagnosis not present

## 2019-02-12 DIAGNOSIS — E1159 Type 2 diabetes mellitus with other circulatory complications: Secondary | ICD-10-CM | POA: Diagnosis not present

## 2019-02-12 DIAGNOSIS — H5440 Blindness, one eye, unspecified eye: Secondary | ICD-10-CM | POA: Diagnosis not present

## 2019-02-12 DIAGNOSIS — I639 Cerebral infarction, unspecified: Secondary | ICD-10-CM | POA: Diagnosis not present

## 2019-02-13 DIAGNOSIS — I639 Cerebral infarction, unspecified: Secondary | ICD-10-CM | POA: Diagnosis not present

## 2019-02-13 DIAGNOSIS — E1159 Type 2 diabetes mellitus with other circulatory complications: Secondary | ICD-10-CM | POA: Diagnosis not present

## 2019-02-13 DIAGNOSIS — H5440 Blindness, one eye, unspecified eye: Secondary | ICD-10-CM | POA: Diagnosis not present

## 2019-02-14 DIAGNOSIS — H5440 Blindness, one eye, unspecified eye: Secondary | ICD-10-CM | POA: Diagnosis not present

## 2019-02-14 DIAGNOSIS — I639 Cerebral infarction, unspecified: Secondary | ICD-10-CM | POA: Diagnosis not present

## 2019-02-14 DIAGNOSIS — E1159 Type 2 diabetes mellitus with other circulatory complications: Secondary | ICD-10-CM | POA: Diagnosis not present

## 2019-02-15 DIAGNOSIS — H5440 Blindness, one eye, unspecified eye: Secondary | ICD-10-CM | POA: Diagnosis not present

## 2019-02-15 DIAGNOSIS — I639 Cerebral infarction, unspecified: Secondary | ICD-10-CM | POA: Diagnosis not present

## 2019-02-15 DIAGNOSIS — E1159 Type 2 diabetes mellitus with other circulatory complications: Secondary | ICD-10-CM | POA: Diagnosis not present

## 2019-02-16 DIAGNOSIS — I639 Cerebral infarction, unspecified: Secondary | ICD-10-CM | POA: Diagnosis not present

## 2019-02-16 DIAGNOSIS — E1159 Type 2 diabetes mellitus with other circulatory complications: Secondary | ICD-10-CM | POA: Diagnosis not present

## 2019-02-16 DIAGNOSIS — H5440 Blindness, one eye, unspecified eye: Secondary | ICD-10-CM | POA: Diagnosis not present

## 2019-02-17 DIAGNOSIS — I639 Cerebral infarction, unspecified: Secondary | ICD-10-CM | POA: Diagnosis not present

## 2019-02-17 DIAGNOSIS — H5440 Blindness, one eye, unspecified eye: Secondary | ICD-10-CM | POA: Diagnosis not present

## 2019-02-17 DIAGNOSIS — E1159 Type 2 diabetes mellitus with other circulatory complications: Secondary | ICD-10-CM | POA: Diagnosis not present

## 2019-02-18 DIAGNOSIS — H5440 Blindness, one eye, unspecified eye: Secondary | ICD-10-CM | POA: Diagnosis not present

## 2019-02-18 DIAGNOSIS — E1159 Type 2 diabetes mellitus with other circulatory complications: Secondary | ICD-10-CM | POA: Diagnosis not present

## 2019-02-18 DIAGNOSIS — I639 Cerebral infarction, unspecified: Secondary | ICD-10-CM | POA: Diagnosis not present

## 2019-02-19 DIAGNOSIS — I639 Cerebral infarction, unspecified: Secondary | ICD-10-CM | POA: Diagnosis not present

## 2019-02-19 DIAGNOSIS — H5440 Blindness, one eye, unspecified eye: Secondary | ICD-10-CM | POA: Diagnosis not present

## 2019-02-19 DIAGNOSIS — E1159 Type 2 diabetes mellitus with other circulatory complications: Secondary | ICD-10-CM | POA: Diagnosis not present

## 2019-02-20 DIAGNOSIS — E1159 Type 2 diabetes mellitus with other circulatory complications: Secondary | ICD-10-CM | POA: Diagnosis not present

## 2019-02-20 DIAGNOSIS — H5440 Blindness, one eye, unspecified eye: Secondary | ICD-10-CM | POA: Diagnosis not present

## 2019-02-20 DIAGNOSIS — I639 Cerebral infarction, unspecified: Secondary | ICD-10-CM | POA: Diagnosis not present

## 2019-02-20 MED FILL — LISINOPRIL 20 MG TABLET: 20 | 30 days supply | Qty: 30 | Fill #2

## 2019-02-20 MED FILL — LANTUS 100 UNITS/ML VIAL: 100 | 28 days supply | Qty: 10 | Fill #2

## 2019-02-21 DIAGNOSIS — I639 Cerebral infarction, unspecified: Secondary | ICD-10-CM | POA: Diagnosis not present

## 2019-02-21 DIAGNOSIS — E1159 Type 2 diabetes mellitus with other circulatory complications: Secondary | ICD-10-CM | POA: Diagnosis not present

## 2019-02-21 DIAGNOSIS — H5440 Blindness, one eye, unspecified eye: Secondary | ICD-10-CM | POA: Diagnosis not present

## 2019-02-22 DIAGNOSIS — H5440 Blindness, one eye, unspecified eye: Secondary | ICD-10-CM | POA: Diagnosis not present

## 2019-02-22 DIAGNOSIS — I639 Cerebral infarction, unspecified: Secondary | ICD-10-CM | POA: Diagnosis not present

## 2019-02-22 DIAGNOSIS — E1159 Type 2 diabetes mellitus with other circulatory complications: Secondary | ICD-10-CM | POA: Diagnosis not present

## 2019-02-23 DIAGNOSIS — H5440 Blindness, one eye, unspecified eye: Secondary | ICD-10-CM | POA: Diagnosis not present

## 2019-02-23 DIAGNOSIS — E1159 Type 2 diabetes mellitus with other circulatory complications: Secondary | ICD-10-CM | POA: Diagnosis not present

## 2019-02-23 DIAGNOSIS — I639 Cerebral infarction, unspecified: Secondary | ICD-10-CM | POA: Diagnosis not present

## 2019-02-24 DIAGNOSIS — H5440 Blindness, one eye, unspecified eye: Secondary | ICD-10-CM | POA: Diagnosis not present

## 2019-02-24 DIAGNOSIS — I639 Cerebral infarction, unspecified: Secondary | ICD-10-CM | POA: Diagnosis not present

## 2019-02-24 DIAGNOSIS — E1159 Type 2 diabetes mellitus with other circulatory complications: Secondary | ICD-10-CM | POA: Diagnosis not present

## 2019-02-25 DIAGNOSIS — E1159 Type 2 diabetes mellitus with other circulatory complications: Secondary | ICD-10-CM | POA: Diagnosis not present

## 2019-02-25 DIAGNOSIS — H5440 Blindness, one eye, unspecified eye: Secondary | ICD-10-CM | POA: Diagnosis not present

## 2019-02-25 DIAGNOSIS — I639 Cerebral infarction, unspecified: Secondary | ICD-10-CM | POA: Diagnosis not present

## 2019-02-26 DIAGNOSIS — H5440 Blindness, one eye, unspecified eye: Secondary | ICD-10-CM | POA: Diagnosis not present

## 2019-02-26 DIAGNOSIS — E1159 Type 2 diabetes mellitus with other circulatory complications: Secondary | ICD-10-CM | POA: Diagnosis not present

## 2019-02-26 DIAGNOSIS — I639 Cerebral infarction, unspecified: Secondary | ICD-10-CM | POA: Diagnosis not present

## 2019-02-27 DIAGNOSIS — E1159 Type 2 diabetes mellitus with other circulatory complications: Secondary | ICD-10-CM | POA: Diagnosis not present

## 2019-02-27 DIAGNOSIS — I639 Cerebral infarction, unspecified: Secondary | ICD-10-CM | POA: Diagnosis not present

## 2019-02-27 DIAGNOSIS — H5440 Blindness, one eye, unspecified eye: Secondary | ICD-10-CM | POA: Diagnosis not present

## 2019-02-28 DIAGNOSIS — H5440 Blindness, one eye, unspecified eye: Secondary | ICD-10-CM | POA: Diagnosis not present

## 2019-02-28 DIAGNOSIS — E1159 Type 2 diabetes mellitus with other circulatory complications: Secondary | ICD-10-CM | POA: Diagnosis not present

## 2019-02-28 DIAGNOSIS — I639 Cerebral infarction, unspecified: Secondary | ICD-10-CM | POA: Diagnosis not present

## 2019-03-01 DIAGNOSIS — H5440 Blindness, one eye, unspecified eye: Secondary | ICD-10-CM | POA: Diagnosis not present

## 2019-03-01 DIAGNOSIS — I639 Cerebral infarction, unspecified: Secondary | ICD-10-CM | POA: Diagnosis not present

## 2019-03-01 DIAGNOSIS — E1159 Type 2 diabetes mellitus with other circulatory complications: Secondary | ICD-10-CM | POA: Diagnosis not present

## 2019-03-02 DIAGNOSIS — H5440 Blindness, one eye, unspecified eye: Secondary | ICD-10-CM | POA: Diagnosis not present

## 2019-03-02 DIAGNOSIS — E1159 Type 2 diabetes mellitus with other circulatory complications: Secondary | ICD-10-CM | POA: Diagnosis not present

## 2019-03-02 DIAGNOSIS — I639 Cerebral infarction, unspecified: Secondary | ICD-10-CM | POA: Diagnosis not present

## 2019-03-03 DIAGNOSIS — E1159 Type 2 diabetes mellitus with other circulatory complications: Secondary | ICD-10-CM | POA: Diagnosis not present

## 2019-03-03 DIAGNOSIS — I639 Cerebral infarction, unspecified: Secondary | ICD-10-CM | POA: Diagnosis not present

## 2019-03-03 DIAGNOSIS — H5440 Blindness, one eye, unspecified eye: Secondary | ICD-10-CM | POA: Diagnosis not present

## 2019-03-04 DIAGNOSIS — E1159 Type 2 diabetes mellitus with other circulatory complications: Secondary | ICD-10-CM | POA: Diagnosis not present

## 2019-03-04 DIAGNOSIS — I639 Cerebral infarction, unspecified: Secondary | ICD-10-CM | POA: Diagnosis not present

## 2019-03-04 DIAGNOSIS — H5440 Blindness, one eye, unspecified eye: Secondary | ICD-10-CM | POA: Diagnosis not present

## 2019-03-05 DIAGNOSIS — E1159 Type 2 diabetes mellitus with other circulatory complications: Secondary | ICD-10-CM | POA: Diagnosis not present

## 2019-03-05 DIAGNOSIS — H5440 Blindness, one eye, unspecified eye: Secondary | ICD-10-CM | POA: Diagnosis not present

## 2019-03-05 DIAGNOSIS — I639 Cerebral infarction, unspecified: Secondary | ICD-10-CM | POA: Diagnosis not present

## 2019-03-06 DIAGNOSIS — H5440 Blindness, one eye, unspecified eye: Secondary | ICD-10-CM | POA: Diagnosis not present

## 2019-03-06 DIAGNOSIS — E1159 Type 2 diabetes mellitus with other circulatory complications: Secondary | ICD-10-CM | POA: Diagnosis not present

## 2019-03-06 DIAGNOSIS — I639 Cerebral infarction, unspecified: Secondary | ICD-10-CM | POA: Diagnosis not present

## 2019-03-07 DIAGNOSIS — H5440 Blindness, one eye, unspecified eye: Secondary | ICD-10-CM | POA: Diagnosis not present

## 2019-03-07 DIAGNOSIS — I639 Cerebral infarction, unspecified: Secondary | ICD-10-CM | POA: Diagnosis not present

## 2019-03-07 DIAGNOSIS — E1159 Type 2 diabetes mellitus with other circulatory complications: Secondary | ICD-10-CM | POA: Diagnosis not present

## 2019-03-08 DIAGNOSIS — E1159 Type 2 diabetes mellitus with other circulatory complications: Secondary | ICD-10-CM | POA: Diagnosis not present

## 2019-03-08 DIAGNOSIS — I639 Cerebral infarction, unspecified: Secondary | ICD-10-CM | POA: Diagnosis not present

## 2019-03-08 DIAGNOSIS — H5440 Blindness, one eye, unspecified eye: Secondary | ICD-10-CM | POA: Diagnosis not present

## 2019-03-09 DIAGNOSIS — I639 Cerebral infarction, unspecified: Secondary | ICD-10-CM | POA: Diagnosis not present

## 2019-03-09 DIAGNOSIS — E1159 Type 2 diabetes mellitus with other circulatory complications: Secondary | ICD-10-CM | POA: Diagnosis not present

## 2019-03-09 DIAGNOSIS — H5440 Blindness, one eye, unspecified eye: Secondary | ICD-10-CM | POA: Diagnosis not present

## 2019-03-10 DIAGNOSIS — E1159 Type 2 diabetes mellitus with other circulatory complications: Secondary | ICD-10-CM | POA: Diagnosis not present

## 2019-03-10 DIAGNOSIS — H5440 Blindness, one eye, unspecified eye: Secondary | ICD-10-CM | POA: Diagnosis not present

## 2019-03-10 DIAGNOSIS — I639 Cerebral infarction, unspecified: Secondary | ICD-10-CM | POA: Diagnosis not present

## 2019-03-11 DIAGNOSIS — H5440 Blindness, one eye, unspecified eye: Secondary | ICD-10-CM | POA: Diagnosis not present

## 2019-03-11 DIAGNOSIS — I639 Cerebral infarction, unspecified: Secondary | ICD-10-CM | POA: Diagnosis not present

## 2019-03-11 DIAGNOSIS — E1159 Type 2 diabetes mellitus with other circulatory complications: Secondary | ICD-10-CM | POA: Diagnosis not present

## 2019-03-12 DIAGNOSIS — E1159 Type 2 diabetes mellitus with other circulatory complications: Secondary | ICD-10-CM | POA: Diagnosis not present

## 2019-03-12 DIAGNOSIS — H5440 Blindness, one eye, unspecified eye: Secondary | ICD-10-CM | POA: Diagnosis not present

## 2019-03-12 DIAGNOSIS — I639 Cerebral infarction, unspecified: Secondary | ICD-10-CM | POA: Diagnosis not present

## 2019-04-18 ENCOUNTER — Ambulatory Visit: Payer: Medicaid Other | Admitting: Family Medicine

## 2019-04-24 DIAGNOSIS — H40033 Anatomical narrow angle, bilateral: Secondary | ICD-10-CM | POA: Diagnosis not present

## 2019-04-24 DIAGNOSIS — G44209 Tension-type headache, unspecified, not intractable: Secondary | ICD-10-CM | POA: Diagnosis not present

## 2019-05-02 ENCOUNTER — Ambulatory Visit: Payer: Medicaid Other | Admitting: Podiatry

## 2019-05-03 MED FILL — LANTUS 100 UNITS/ML VIAL: 100 | 28 days supply | Qty: 10 | Fill #3

## 2019-05-03 MED FILL — LISINOPRIL 20 MG TABLET: 20 | 30 days supply | Qty: 30 | Fill #1

## 2019-05-08 ENCOUNTER — Ambulatory Visit: Payer: Medicaid Other | Admitting: Podiatry

## 2019-06-13 ENCOUNTER — Ambulatory Visit: Payer: Medicaid Other | Admitting: Family Medicine

## 2019-07-06 MED FILL — LANTUS 100 UNITS/ML VIAL: 100 | 28 days supply | Qty: 10 | Fill #4

## 2019-07-06 MED FILL — LISINOPRIL 20 MG TABLET: 20 | 30 days supply | Qty: 30 | Fill #2

## 2019-07-07 ENCOUNTER — Encounter: Payer: Self-pay | Admitting: Podiatry

## 2019-07-07 ENCOUNTER — Other Ambulatory Visit: Payer: Self-pay

## 2019-07-07 ENCOUNTER — Ambulatory Visit: Payer: Medicaid Other | Admitting: Podiatry

## 2019-07-07 DIAGNOSIS — E119 Type 2 diabetes mellitus without complications: Secondary | ICD-10-CM

## 2019-07-07 DIAGNOSIS — B351 Tinea unguium: Secondary | ICD-10-CM | POA: Diagnosis not present

## 2019-07-07 DIAGNOSIS — M79675 Pain in left toe(s): Secondary | ICD-10-CM | POA: Diagnosis not present

## 2019-07-07 DIAGNOSIS — E1165 Type 2 diabetes mellitus with hyperglycemia: Secondary | ICD-10-CM | POA: Diagnosis not present

## 2019-07-07 DIAGNOSIS — E118 Type 2 diabetes mellitus with unspecified complications: Secondary | ICD-10-CM

## 2019-07-07 DIAGNOSIS — IMO0002 Reserved for concepts with insufficient information to code with codable children: Secondary | ICD-10-CM

## 2019-07-07 DIAGNOSIS — Q828 Other specified congenital malformations of skin: Secondary | ICD-10-CM

## 2019-07-07 DIAGNOSIS — M79674 Pain in right toe(s): Secondary | ICD-10-CM

## 2019-07-07 NOTE — Patient Instructions (Signed)
Peripheral Neuropathy Peripheral neuropathy is a type of nerve damage. It affects nerves that carry signals between the spinal cord and the arms, legs, and the rest of the body (peripheral nerves). It does not affect nerves in the spinal cord or brain. In peripheral neuropathy, one nerve or a group of nerves may be damaged. Peripheral neuropathy is a broad category that includes many specific nerve disorders, like diabetic neuropathy, hereditary neuropathy, and carpal tunnel syndrome. What are the causes? This condition may be caused by:  Diabetes. This is the most common cause of peripheral neuropathy.  Nerve injury.  Pressure or stress on a nerve that lasts a long time.  Lack (deficiency) of B vitamins. This can result from alcoholism, poor diet, or a restricted diet.  Infections.  Autoimmune diseases, such as rheumatoid arthritis and systemic lupus erythematosus.  Nerve diseases that are passed from parent to child (inherited).  Some medicines, such as cancer medicines (chemotherapy).  Poisonous (toxic) substances, such as lead and mercury.  Too little blood flowing to the legs.  Kidney disease.  Thyroid disease. In some cases, the cause of this condition is not known. What are the signs or symptoms? Symptoms of this condition depend on which of your nerves is damaged. Common symptoms include:  Loss of feeling (numbness) in the feet, hands, or both.  Tingling in the feet, hands, or both.  Burning pain.  Very sensitive skin.  Weakness.  Not being able to move a part of the body (paralysis).  Muscle twitching.  Clumsiness or poor coordination.  Loss of balance.  Not being able to control your bladder.  Feeling dizzy.  Sexual problems. How is this diagnosed? Diagnosing and finding the cause of peripheral neuropathy can be difficult. Your health care provider will take your medical history and do a physical exam. A neurological exam will also be done. This  involves checking things that are affected by your brain, spinal cord, and nerves (nervous system). For example, your health care provider will check your reflexes, how you move, and what you can feel. You may have other tests, such as:  Blood tests.  Electromyogram (EMG) and nerve conduction tests. These tests check nerve function and how well the nerves are controlling the muscles.  Imaging tests, such as CT scans or MRI to rule out other causes of your symptoms.  Removing a small piece of nerve to be examined in a lab (nerve biopsy). This is rare.  Removing and examining a small amount of the fluid that surrounds the brain and spinal cord (lumbar puncture). This is rare. How is this treated? Treatment for this condition may involve:  Treating the underlying cause of the neuropathy, such as diabetes, kidney disease, or vitamin deficiencies.  Stopping medicines that can cause neuropathy, such as chemotherapy.  Medicine to relieve pain. Medicines may include: ? Prescription or over-the-counter pain medicine. ? Antiseizure medicine. ? Antidepressants. ? Pain-relieving patches that are applied to painful areas of skin.  Surgery to relieve pressure on a nerve or to destroy a nerve that is causing pain.  Physical therapy to help improve movement and balance.  Devices to help you move around (assistive devices). Follow these instructions at home: Medicines  Take over-the-counter and prescription medicines only as told by your health care provider. Do not take any other medicines without first asking your health care provider.  Do not drive or use heavy machinery while taking prescription pain medicine. Lifestyle   Do not use any products that contain nicotine   or tobacco, such as cigarettes and e-cigarettes. Smoking keeps blood from reaching damaged nerves. If you need help quitting, ask your health care provider.  Avoid or limit alcohol. Too much alcohol can cause a vitamin B  deficiency, and vitamin B is needed for healthy nerves.  Eat a healthy diet. This includes: ? Eating foods that are high in fiber, such as fresh fruits and vegetables, whole grains, and beans. ? Limiting foods that are high in fat and processed sugars, such as fried or sweet foods. General instructions   If you have diabetes, work closely with your health care provider to keep your blood sugar under control.  If you have numbness in your feet: ? Check every day for signs of injury or infection. Watch for redness, warmth, and swelling. ? Wear padded socks and comfortable shoes. These help protect your feet.  Develop a good support system. Living with peripheral neuropathy can be stressful. Consider talking with a mental health specialist or joining a support group.  Use assistive devices and attend physical therapy as told by your health care provider. This may include using a walker or a cane.  Keep all follow-up visits as told by your health care provider. This is important. Contact a health care provider if:  You have new signs or symptoms of peripheral neuropathy.  You are struggling emotionally from dealing with peripheral neuropathy.  Your pain is not well-controlled. Get help right away if:  You have an injury or infection that is not healing normally.  You develop new weakness in an arm or leg.  You fall frequently. Summary  Peripheral neuropathy is when the nerves in the arms, or legs are damaged, resulting in numbness, weakness, or pain.  There are many causes of peripheral neuropathy, including diabetes, pinched nerves, vitamin deficiencies, autoimmune disease, and hereditary conditions.  Diagnosing and finding the cause of peripheral neuropathy can be difficult. Your health care provider will take your medical history, do a physical exam, and do tests, including blood tests and nerve function tests.  Treatment involves treating the underlying cause of the  neuropathy and taking medicines to help control pain. Physical therapy and assistive devices may also help. This information is not intended to replace advice given to you by your health care provider. Make sure you discuss any questions you have with your health care provider. Document Revised: 03/26/2017 Document Reviewed: 06/22/2016 Elsevier Patient Education  2020 [employer]  Diabetes Mellitus and Foot Care Foot care is an important part of your health, especially when you have diabetes. Diabetes may cause you to have problems because of poor blood flow (circulation) to your feet and legs, which can cause your skin to:  Become thinner and drier.  Break more easily.  Heal more slowly.  Peel and crack. You may also have nerve damage (neuropathy) in your legs and feet, causing decreased feeling in them. This means that you may not notice minor injuries to your feet that could lead to more serious problems. Noticing and addressing any potential problems early is the best way to prevent future foot problems. How to care for your feet Foot hygiene  Wash your feet daily with warm water and mild soap. Do not use hot water. Then, pat your feet and the areas between your toes until they are completely dry. Do not soak your feet as this can dry your skin.  Trim your toenails straight across. Do not dig under them or around the cuticle. File the edges of your   nails with an emery board or nail file.  Apply a moisturizing lotion or petroleum jelly to the skin on your feet and to dry, brittle toenails. Use lotion that does not contain alcohol and is unscented. Do not apply lotion between your toes. Shoes and socks  Wear clean socks or stockings every day. Make sure they are not too tight. Do not wear knee-high stockings since they may decrease blood flow to your legs.  Wear shoes that fit properly and have enough cushioning. Always look in your shoes before you put them on to be sure there are  no objects inside.  To break in new shoes, wear them for just a few hours a day. This prevents injuries on your feet. Wounds, scrapes, corns, and calluses  Check your feet daily for blisters, cuts, bruises, sores, and redness. If you cannot see the bottom of your feet, use a mirror or ask someone for help.  Do not cut corns or calluses or try to remove them with medicine.  If you find a minor scrape, cut, or break in the skin on your feet, keep it and the skin around it clean and dry. You may clean these areas with mild soap and water. Do not clean the area with peroxide, alcohol, or iodine.  If you have a wound, scrape, corn, or callus on your foot, look at it several times a day to make sure it is healing and not infected. Check for: ? Redness, swelling, or pain. ? Fluid or blood. ? Warmth. ? Pus or a bad smell. General instructions  Do not cross your legs. This may decrease blood flow to your feet.  Do not use heating pads or hot water bottles on your feet. They may burn your skin. If you have lost feeling in your feet or legs, you may not know this is happening until it is too late.  Protect your feet from hot and cold by wearing shoes, such as at the beach or on hot pavement.  Schedule a complete foot exam at least once a year (annually) or more often if you have foot problems. If you have foot problems, report any cuts, sores, or bruises to your health care provider immediately. Contact a health care provider if:  You have a medical condition that increases your risk of infection and you have any cuts, sores, or bruises on your feet.  You have an injury that is not healing.  You have redness on your legs or feet.  You feel burning or tingling in your legs or feet.  You have pain or cramps in your legs and feet.  Your legs or feet are numb.  Your feet always feel cold.  You have pain around a toenail. Get help right away if:  You have a wound, scrape, corn, or callus  on your foot and: ? You have pain, swelling, or redness that gets worse. ? You have fluid or blood coming from the wound, scrape, corn, or callus. ? Your wound, scrape, corn, or callus feels warm to the touch. ? You have pus or a bad smell coming from the wound, scrape, corn, or callus. ? You have a fever. ? You have a red line going up your leg. Summary  Check your feet every day for cuts, sores, red spots, swelling, and blisters.  Moisturize feet and legs daily.  Wear shoes that fit properly and have enough cushioning.  If you have foot problems, report any cuts, sores, or bruises to   your health care provider immediately.  Schedule a complete foot exam at least once a year (annually) or more often if you have foot problems. This information is not intended to replace advice given to you by your health care provider. Make sure you discuss any questions you have with your health care provider. Document Revised: 01/04/2019 Document Reviewed: 05/15/2016 Elsevier Patient Education  2020 [employer]  

## 2019-07-12 ENCOUNTER — Ambulatory Visit: Payer: Medicaid Other | Admitting: Family Medicine

## 2019-07-13 NOTE — Progress Notes (Signed)
Subjective: Frank Schneider presents today for follow up of preventative diabetic foot care, for diabetic foot evaluation and painful porokeratotic lesion(s) plantar aspect of both feet and painful mycotic toenails b/l that limit ambulation. Aggravating factors include weightbearing with and without shoe gear. Pain for both is relieved with periodic professional debridement..   No Known Allergies   Objective: There were no vitals filed for this visit.  Pt 44 y.o. year old AA  male  in NAD. AAO x 3.   Vascular Examination:  Capillary refill time to digits immediate b/l. Capillary fill time to digits <3 seconds b/l. Palpable DP pulses b/l. Palpable PT pulses b/l. Pedal hair sparse b/l. Skin temperature gradient within normal limits b/l.  Dermatological Examination: Pedal skin with normal turgor, texture and tone bilaterally. No open wounds bilaterally. No interdigital macerations bilaterally. Toenails 1-5 b/l elongated, dystrophic, thickened, crumbly with subungual debris and tenderness to dorsal palpation. Porokeratotic lesion(s) submet head 4 left foot, submet head 2 right foot and b/l hallux. No erythema, no edema, no drainage, no flocculence.  Musculoskeletal: Normal muscle strength 5/5 to all lower extremity muscle groups bilaterally, no pain crepitus or joint limitation noted with ROM b/l and hammertoes noted to the  L 2nd toe and R 2nd toe  Neurological: Protective sensation intact 5/5 intact bilaterally with 10g monofilament b/l Vibratory sensation intact b/l.  Assessment: 1. Pain due to onychomycosis of toenails of both feet   2. Porokeratosis   3. Diabetes mellitus type 2 with complications, uncontrolled ([place])   4. Encounter for diabetic foot exam [facility])    Plan: -Medicaid ABN signed for 2021. Copy given to patient on today's visit and copy placed in patient chart. -Diabetic foot examination performed on today's visit. -Continue diabetic foot care principles. Literature  dispensed on today.  -Toenails 1-5 b/l were debrided in length and girth with sterile nail nippers and dremel without iatrogenic bleeding.  -Painful porokeratotic lesions submet head 4 left foot, submet head 2 right foot and b/l hallux pared and enucleated with sterile scalpel blade without incident. -Patient to continue soft, supportive shoe gear daily. -Patient to report any pedal injuries to medical professional immediately. -Patient/POA to call should there be question/concern in the interim.  Return in about 3 months (around 10/07/2019) for diabetic nail trim.

## 2019-09-08 ENCOUNTER — Other Ambulatory Visit: Payer: Self-pay | Admitting: Family Medicine

## 2019-09-08 DIAGNOSIS — I10 Essential (primary) hypertension: Secondary | ICD-10-CM

## 2019-09-20 ENCOUNTER — Encounter: Payer: Self-pay | Admitting: Family Medicine

## 2019-09-20 ENCOUNTER — Other Ambulatory Visit: Payer: Self-pay | Admitting: Family Medicine

## 2019-09-20 ENCOUNTER — Other Ambulatory Visit: Payer: Self-pay

## 2019-09-20 ENCOUNTER — Ambulatory Visit (INDEPENDENT_AMBULATORY_CARE_PROVIDER_SITE_OTHER): Payer: Medicaid Other | Admitting: Family Medicine

## 2019-09-20 VITALS — BP 150/78 | HR 66 | Temp 98.6°F | Ht 70.0 in | Wt 185.0 lb

## 2019-09-20 DIAGNOSIS — I10 Essential (primary) hypertension: Secondary | ICD-10-CM | POA: Diagnosis not present

## 2019-09-20 DIAGNOSIS — E1165 Type 2 diabetes mellitus with hyperglycemia: Secondary | ICD-10-CM | POA: Diagnosis not present

## 2019-09-20 DIAGNOSIS — R10A2 Flank pain, left side: Secondary | ICD-10-CM

## 2019-09-20 DIAGNOSIS — F419 Anxiety disorder, unspecified: Secondary | ICD-10-CM | POA: Diagnosis not present

## 2019-09-20 DIAGNOSIS — R8290 Unspecified abnormal findings in urine: Secondary | ICD-10-CM

## 2019-09-20 DIAGNOSIS — R109 Unspecified abdominal pain: Secondary | ICD-10-CM | POA: Diagnosis not present

## 2019-09-20 DIAGNOSIS — F1090 Alcohol use, unspecified, uncomplicated: Secondary | ICD-10-CM

## 2019-09-20 DIAGNOSIS — R7309 Other abnormal glucose: Secondary | ICD-10-CM | POA: Diagnosis not present

## 2019-09-20 DIAGNOSIS — E118 Type 2 diabetes mellitus with unspecified complications: Secondary | ICD-10-CM | POA: Diagnosis not present

## 2019-09-20 DIAGNOSIS — Z7289 Other problems related to lifestyle: Secondary | ICD-10-CM | POA: Diagnosis not present

## 2019-09-20 DIAGNOSIS — Z794 Long term (current) use of insulin: Secondary | ICD-10-CM | POA: Diagnosis not present

## 2019-09-20 DIAGNOSIS — Z789 Other specified health status: Secondary | ICD-10-CM

## 2019-09-20 DIAGNOSIS — F329 Major depressive disorder, single episode, unspecified: Secondary | ICD-10-CM

## 2019-09-20 DIAGNOSIS — Z720 Tobacco use: Secondary | ICD-10-CM

## 2019-09-20 DIAGNOSIS — H5440 Blindness, one eye, unspecified eye: Secondary | ICD-10-CM | POA: Diagnosis not present

## 2019-09-20 DIAGNOSIS — Z09 Encounter for follow-up examination after completed treatment for conditions other than malignant neoplasm: Secondary | ICD-10-CM

## 2019-09-20 DIAGNOSIS — G8929 Other chronic pain: Secondary | ICD-10-CM

## 2019-09-20 DIAGNOSIS — IMO0002 Reserved for concepts with insufficient information to code with codable children: Secondary | ICD-10-CM

## 2019-09-20 DIAGNOSIS — R739 Hyperglycemia, unspecified: Secondary | ICD-10-CM | POA: Diagnosis not present

## 2019-09-20 LAB — POCT URINALYSIS DIPSTICK
Bilirubin, UA: NEGATIVE
Blood, UA: NEGATIVE
Glucose, UA: POSITIVE — AB
Ketones, UA: NEGATIVE
Nitrite, UA: NEGATIVE
Protein, UA: POSITIVE — AB
Spec Grav, UA: >=1.03 — AB (ref 1.010–1.025)
Urobilinogen, UA: 0.2 E.U./dL
pH, UA: 5 (ref 5.0–8.0)

## 2019-09-20 LAB — POCT GLYCOSYLATED HEMOGLOBIN (HGB A1C): Hemoglobin A1C: 9 % — AB (ref 4.0–5.6)

## 2019-09-20 LAB — GLUCOSE, POCT (MANUAL RESULT ENTRY): POC Glucose: 288 mg/dl — AB (ref 70–99)

## 2019-09-20 MED ORDER — INSULIN GLARGINE 100 UNIT/ML ~~LOC~~ SOLN: SUBCUTANEOUS | 11 refills | Status: DC

## 2019-09-20 MED ORDER — NAPROXEN 500 MG PO TABS: 500.0000 mg | ORAL_TABLET | Freq: Two times a day (BID) | ORAL | 6 refills | Status: DC | PRN

## 2019-09-20 MED ORDER — LISINOPRIL 20 MG PO TABS
20.0000 mg | ORAL_TABLET | Freq: Every day | ORAL | 11 refills | Status: DC
  Filled 2020-08-22: qty 30, 30d supply, fill #0

## 2019-09-20 MED ORDER — ATORVASTATIN CALCIUM 40 MG PO TABS: 40.0000 mg | ORAL_TABLET | Freq: Every day | ORAL | 11 refills | Status: DC

## 2019-09-20 MED ORDER — AMLODIPINE BESYLATE 10 MG PO TABS: 10.0000 mg | ORAL_TABLET | Freq: Every day | ORAL | 11 refills | Status: DC

## 2019-09-20 MED FILL — AMLODIPINE BESYLATE 10 MG T: 10 | 30 days supply | Qty: 30 | Fill #0

## 2019-09-20 MED FILL — LISINOPRIL 20 MG TABLET: 20 | 30 days supply | Qty: 30 | Fill #0

## 2019-09-20 MED FILL — LANTUS 100 UNITS/ML VIAL: 100 | 28 days supply | Qty: 10 | Fill #0

## 2019-09-20 MED FILL — ATORVASTATIN CALCIUM 40 MG: 40 | 30 days supply | Qty: 30 | Fill #0

## 2019-09-20 MED FILL — NAPROXEN 500 MG TABLET: 500 | 15 days supply | Qty: 30 | Fill #0

## 2019-09-20 NOTE — Progress Notes (Signed)
Patient [facility]  Established Patient Office Visit  Subjective:  Patient ID: Frank Schneider, male    DOB: 1975-08-27  Age: 44 y.o. MRN: [id number]  CC:  Chief Complaint  Patient presents with  . Follow-up    DM  . Depression  . Pain    left rib cage    HPI Frank Schneider is a 44 year old male hwo presents for Follow Up today.   Past Medical History:  Diagnosis Date  . Blind right eye   . Diabetes mellitus    Takes Metformin  . History of appendicitis 11/2015  . Hypertension   . Priapism   . Stroke ([place]) 04/2015  . Tobacco use   . Vision abnormalities    Current Status: Since his last office visit, he has c/o of chronic left flank pain X 6 months now. He denies urinary frequency, discharge, dysuria, urinary itching, burning, odor, hematuria, and suprapubic pain/discomfort. He denies visual changes, chest pain, cough, shortness of breath, heart palpitations, and falls. He has occasional headaches and dizziness with position changes. Denies severe headaches, confusion, seizures, double vision, and blurred vision, nausea and vomiting. He denies fatigue, frequent urination, blurred vision, excessive hunger, excessive thirst, weight gain, weight loss, and poor wound healing. He continues to check his feet regularly. He states that he feels that he needs to 'get his life together.' He is in the process of finding employment. His anxiety is moderate today. He states that he has had an incident to where he took a lot of pills, but nothing happened. He states that it was in 2018. He did not follow up at that time with hospital of Psychiatry. Today, he denies suicidal ideations, homicidal ideations, or auditory hallucinations. He does receive a phone call and abruptly states that he has to follow up with appointment at a later time, because he had received call for an interview. He denies fevers, chills, recent infections, weight loss, and night  sweats. Denies GI problems such as nausea, vomiting, diarrhea, and constipation. He has no reports of blood in stools, dysuria and hematuria. He is taking all medications as prescribed.   Past Surgical History:  Procedure Laterality Date  . APPENDECTOMY    . EYE SURGERY     cataract removed 05/27/2016 right eye   . INCISION AND DRAINAGE PERIRECTAL ABSCESS  07/28/2011   Procedure: IRRIGATION AND DEBRIDEMENT PERIRECTAL ABSCESS;  Surgeon: Adin Hector, MD;  Location: [facility];  Service: General;  Laterality: N/A;   of skin muscle and subcutaneous tissue of perimeum  8cmx12cm area   . IR GENERIC HISTORICAL  01/08/2016   IR RADIOLOGIST EVAL & MGMT 01/08/2016 [facility] INTERV RAD  . LAPAROSCOPIC APPENDECTOMY N/A 12/16/2015   Procedure: APPENDECTOMY LAPAROSCOPIC;  Surgeon: Coralie Keens, MD;  Location: [facility];  Service: General;  Laterality: N/A;  . PENECTOMY      Family History  Problem Relation Age of Onset  . Diabetes Mother   . Hypertension Mother   . Diabetes Maternal Grandmother   . Hypertension Maternal Grandmother   . Depression Maternal Grandmother     Social History   Socioeconomic History  . Marital status: Single    Spouse name: Not on file  . Number of children: Not on file  . Years of education: Not on file  . Highest education level: Not on file  Occupational History  . Not on file  Tobacco Use  . Smoking status: Current Some Day Smoker  Packs/day: 0.25    Years: 8.00    Pack years: 2.00    Types: Cigarettes  . Smokeless tobacco: Never Used  Substance and Sexual Activity  . Alcohol use: No  . Drug use: Yes    Types: Marijuana    Comment: 3x wk  . Sexual activity: Yes  Other Topics Concern  . Not on file  Social History Narrative  . Not on file   Social Determinants of Health   Financial Resource Strain:   . Difficulty of Paying Living Expenses:   Food Insecurity:   . Worried About Charity fundraiser in the Last Year:   . Arboriculturist in the Last  Year:   Transportation Needs:   . Film/video editor (Medical):   Marland Kitchen Lack of Transportation (Non-Medical):   Physical Activity:   . Days of Exercise per Week:   . Minutes of Exercise per Session:   Stress:   . Feeling of Stress :   Social Connections:   . Frequency of Communication with Friends and Family:   . Frequency of Social Gatherings with Friends and Family:   . Attends Religious Services:   . Active Member of Clubs or Organizations:   . Attends Archivist Meetings:   Marland Kitchen Marital Status:   Intimate Partner Violence:   . Fear of Current or Ex-Partner:   . Emotionally Abused:   Marland Kitchen Physically Abused:   . Sexually Abused:     Outpatient Medications Prior to Visit  Medication Sig Dispense Refill  . aspirin 325 MG tablet Take 1 tablet (325 mg total) by mouth daily. 30 tablet 0  . atropine 1 % ophthalmic ointment Place 1 application into the right eye daily. 3.5 g 12  . Blood Glucose Monitoring Suppl (TRUE METRIX AIR GLUCOSE METER) DEVI 1 each by Does not apply route 2 (two) times daily at 10 AM and 5 PM. 1 Device 0  . ferrous sulfate 325 (65 FE) MG tablet Take 1 tablet (325 mg total) daily with breakfast by mouth. 30 tablet 3  . glucose blood (TRUE METRIX BLOOD GLUCOSE TEST) test strip Use as instructed 100 each 12  . HYDROcodone-acetaminophen (NORCO) 5-325 MG tablet Take 1 tablet by mouth every 6 (six) hours as needed for severe pain. 10 tablet 0  . Insulin Syringe-Needle U-100 (TRUEPLUS INSULIN SYRINGE) 31G X 5/16" 1 ML MISC USE AS DIRECTED. 100 each 3  . Lancets (FREESTYLE) lancets Use as instructed 100 each 12  . amLODipine (NORVASC) 10 MG tablet Take 1 tablet (10 mg total) by mouth daily. 30 tablet 5  . atorvastatin (LIPITOR) 40 MG tablet Take 1 tablet (40 mg total) by mouth daily. 30 tablet 11  . insulin glargine (LANTUS) 100 UNIT/ML injection INJECT 30 UNITS INTO THE SKIN AT BEDTIME. 10 mL 11  . lisinopril (ZESTRIL) 20 MG tablet Take 1 tablet (20 mg total) by  mouth daily. 30 tablet 2  . naproxen (NAPROSYN) 500 MG tablet Take 1 tablet (500 mg total) by mouth 2 (two) times daily as needed for mild pain, moderate pain or headache (TAKE WITH MEALS.). 20 tablet 0   No facility-administered medications prior to visit.    No Known Allergies  ROS Review of Systems  Constitutional: Negative.   HENT: Negative.   Eyes: Negative.   Respiratory: Negative.   Cardiovascular: Negative.   Gastrointestinal: Negative.   Endocrine: Negative.   Genitourinary: Negative.   Musculoskeletal: Negative.   Skin: Negative.   Allergic/Immunologic: Negative.  Neurological: Positive for dizziness (occasional ) and headaches (occassional ).  Hematological: Negative.   Psychiatric/Behavioral: Negative.       Objective:    Physical Exam  Constitutional: He is oriented to person, place, and time. He appears well-developed and well-nourished.  HENT:  Head: Normocephalic and atraumatic.  Eyes: Conjunctivae are normal.  Cardiovascular: Normal rate, regular rhythm, normal heart sounds and intact distal pulses.  Pulmonary/Chest: Effort normal and breath sounds normal.  Abdominal: Soft. Bowel sounds are normal.  Musculoskeletal:        General: Normal range of motion.     Cervical back: Normal range of motion and neck supple.  Neurological: He is alert and oriented to person, place, and time. He has normal reflexes.  Skin: Skin is warm and dry.  Psychiatric: He has a normal mood and affect. His behavior is normal. Judgment and thought content normal.  Nursing note and vitals reviewed.   BP (!) 150/78 Comment: no bp meds in 2 wks  Pulse 66   Temp 98.6 F (37 C)   Ht 5\' 10"  (1.778 m)   Wt 185 lb (83.9 kg)   SpO2 99%   BMI 26.54 kg/m  Wt Readings from Last 3 Encounters:  09/20/19 185 lb (83.9 kg)  09/18/18 198 lb (89.8 kg)  06/02/18 198 lb (89.8 kg)     Health Maintenance Due  Topic Date Due  . COVID-19 Vaccine (1) Never done  . OPHTHALMOLOGY EXAM   09/23/2018    There are no preventive care reminders to display for this patient.  Lab Results  Component Value Date   TSH 1.400 11/19/2017   Lab Results  Component Value Date   WBC 13.4 (H) 09/18/2018   HGB 11.6 (L) 09/18/2018   HCT 37.7 (L) 09/18/2018   MCV 63.1 (L) 09/18/2018   PLT 192 09/18/2018   Lab Results  Component Value Date   NA 138 09/18/2018   K 4.7 09/18/2018   CO2 26 09/18/2018   GLUCOSE 153 (H) 09/18/2018   BUN 13 09/18/2018   CREATININE 0.99 09/18/2018   BILITOT 0.8 06/02/2018   ALKPHOS 62 06/02/2018   AST 18 06/02/2018   ALT 16 06/02/2018   PROT 7.3 06/02/2018   ALBUMIN 4.4 06/02/2018   CALCIUM 9.2 09/18/2018   ANIONGAP 10 09/18/2018   Lab Results  Component Value Date   CHOL 147 04/30/2018   Lab Results  Component Value Date   HDL 24 (L) 04/30/2018   Lab Results  Component Value Date   LDLCALC 88 04/30/2018   Lab Results  Component Value Date   TRIG 173 (H) 04/30/2018   Lab Results  Component Value Date   CHOLHDL 6.1 04/30/2018   Lab Results  Component Value Date   HGBA1C 9.0 (A) 09/20/2019      Assessment & Plan:   1. Anxiety and depression  2. Uncontrolled type 2 diabetes mellitus with complication, with long-term current use of insulin ([place]) He will continue medication as prescribed, to decrease foods/beverages high in sugars and carbs and follow Heart Healthy or DASH diet. Increase physical activity to at least 30 minutes cardio exercise daily.  - POCT glycosylated hemoglobin (Hb A1C) - POCT urinalysis dipstick - POCT glucose (manual entry) - CBC with Differential; Future - Comprehensive metabolic panel; Future - Lipid Panel; Future - TSH; Future - Vitamin B12; Future - Vitamin D, 25-hydroxy; Future - atorvastatin (LIPITOR) 40 MG tablet; Take 1 tablet (40 mg total) by mouth daily.  Dispense: 30 tablet; Refill:  11 - insulin glargine (LANTUS) 100 UNIT/ML injection; INJECT 30 UNITS INTO THE SKIN AT BEDTIME.  Dispense: 10  mL; Refill: 11  3. Hyperglycemia  4. Hemoglobin A1c between 7.0% and 9.0% Hgb A1c is increased at 9.0 today, from 7.3 on 04/30/2018. Monitor.   5. Essential hypertension He will continue to take medications as prescribed, to decrease high sodium intake, excessive alcohol intake, increase potassium intake, smoking cessation, and increase physical activity of at least 30 minutes of cardio activity daily. He will continue to follow Heart Healthy or DASH diet. - amLODipine (NORVASC) 10 MG tablet; Take 1 tablet (10 mg total) by mouth daily.  Dispense: 30 tablet; Refill: 11 - lisinopril (ZESTRIL) 20 MG tablet; Take 1 tablet (20 mg total) by mouth daily.  Dispense: 30 tablet; Refill: 11  6. Chronic left flank pain - US RENAL; Future - naproxen (NAPROSYN) 500 MG tablet; Take 1 tablet (500 mg total) by mouth 2 (two) times daily as needed for mild pain, moderate pain or headache (TAKE WITH MEALS.).  Dispense: 30 tablet; Refill: 6  7. Blindness of one eye  8. Tobacco use  9. Alcohol use  10. Abnormal urinalysis Results are pending.  - Urine Culture  11. Follow up He will follow up in 1 week.   Meds ordered this encounter  Medications  . amLODipine (NORVASC) 10 MG tablet    Sig: Take 1 tablet (10 mg total) by mouth daily.    Dispense:  30 tablet    Refill:  11  . atorvastatin (LIPITOR) 40 MG tablet    Sig: Take 1 tablet (40 mg total) by mouth daily.    Dispense:  30 tablet    Refill:  11  . insulin glargine (LANTUS) 100 UNIT/ML injection    Sig: INJECT 30 UNITS INTO THE SKIN AT BEDTIME.    Dispense:  10 mL    Refill:  11  . lisinopril (ZESTRIL) 20 MG tablet    Sig: Take 1 tablet (20 mg total) by mouth daily.    Dispense:  30 tablet    Refill:  11  . naproxen (NAPROSYN) 500 MG tablet    Sig: Take 1 tablet (500 mg total) by mouth 2 (two) times daily as needed for mild pain, moderate pain or headache (TAKE WITH MEALS.).    Dispense:  30 tablet    Refill:  6    Orders Placed This  Encounter  Procedures  . Urine Culture  . US RENAL  . CBC with Differential  . Comprehensive metabolic panel  . Lipid Panel  . TSH  . Vitamin B12  . Vitamin D, 25-hydroxy  . POCT glycosylated hemoglobin (Hb A1C)  . POCT urinalysis dipstick  . POCT glucose (manual entry)    Referral Orders  No referral(s) requested today    Kathe Becton,  MSN, FNP-BC [place], [place] [address] [phone] fax   Problem List Items Addressed This Visit      Cardiovascular and Mediastinum   Essential hypertension   Relevant Medications   amLODipine (NORVASC) 10 MG tablet   atorvastatin (LIPITOR) 40 MG tablet   lisinopril (ZESTRIL) 20 MG tablet     Other   Blind right eye   Tobacco use    Other Visit Diagnoses    Anxiety and depression    -  Primary   Uncontrolled type 2 diabetes mellitus with complication, with long-term current use of  insulin ([facility])       Relevant Medications   atorvastatin (LIPITOR) 40 MG tablet   insulin glargine (LANTUS) 100 UNIT/ML injection   lisinopril (ZESTRIL) 20 MG tablet   Other Relevant Orders   POCT glycosylated hemoglobin (Hb A1C) (Completed)   POCT urinalysis dipstick (Completed)   POCT glucose (manual entry) (Completed)   CBC with Differential   Comprehensive metabolic panel   Lipid Panel   TSH   Vitamin B12   Vitamin D, 25-hydroxy   Hyperglycemia       Hemoglobin A1c between 7.0% and 9.0%       Chronic left flank pain       Relevant Medications   naproxen (NAPROSYN) 500 MG tablet   Other Relevant Orders   US RENAL   Alcohol use       Abnormal urinalysis       Relevant Orders   Urine Culture (Completed)   Follow up          Meds ordered this encounter  Medications  . amLODipine (NORVASC) 10 MG tablet    Sig: Take 1 tablet (10 mg total) by mouth daily.    Dispense:  30 tablet    Refill:  11  . atorvastatin (LIPITOR) 40  MG tablet    Sig: Take 1 tablet (40 mg total) by mouth daily.    Dispense:  30 tablet    Refill:  11  . insulin glargine (LANTUS) 100 UNIT/ML injection    Sig: INJECT 30 UNITS INTO THE SKIN AT BEDTIME.    Dispense:  10 mL    Refill:  11  . lisinopril (ZESTRIL) 20 MG tablet    Sig: Take 1 tablet (20 mg total) by mouth daily.    Dispense:  30 tablet    Refill:  11  . naproxen (NAPROSYN) 500 MG tablet    Sig: Take 1 tablet (500 mg total) by mouth 2 (two) times daily as needed for mild pain, moderate pain or headache (TAKE WITH MEALS.).    Dispense:  30 tablet    Refill:  6    Follow-up: Return in about 1 week (around 09/27/2019).    Azzie Glatter, FNP

## 2019-09-22 DIAGNOSIS — H1013 Acute atopic conjunctivitis, bilateral: Secondary | ICD-10-CM | POA: Diagnosis not present

## 2019-09-22 LAB — URINE CULTURE: Organism ID, Bacteria: NO GROWTH

## 2019-09-24 DIAGNOSIS — R10A2 Flank pain, left side: Secondary | ICD-10-CM | POA: Insufficient documentation

## 2019-09-24 DIAGNOSIS — G8929 Other chronic pain: Secondary | ICD-10-CM | POA: Insufficient documentation

## 2019-09-24 DIAGNOSIS — F419 Anxiety disorder, unspecified: Secondary | ICD-10-CM | POA: Insufficient documentation

## 2019-09-27 ENCOUNTER — Ambulatory Visit: Payer: Medicaid Other | Admitting: Family Medicine

## 2019-09-28 ENCOUNTER — Ambulatory Visit (HOSPITAL_COMMUNITY): Payer: Medicaid Other

## 2019-10-03 ENCOUNTER — Ambulatory Visit (HOSPITAL_COMMUNITY): Payer: Medicaid Other | Attending: Family Medicine

## 2019-10-04 ENCOUNTER — Ambulatory Visit (HOSPITAL_COMMUNITY): Payer: Medicaid Other

## 2019-10-13 ENCOUNTER — Ambulatory Visit: Payer: Medicaid Other | Admitting: Podiatry

## 2019-10-21 DIAGNOSIS — H1013 Acute atopic conjunctivitis, bilateral: Secondary | ICD-10-CM | POA: Diagnosis not present

## 2019-12-27 DIAGNOSIS — R109 Unspecified abdominal pain: Secondary | ICD-10-CM

## 2019-12-27 DIAGNOSIS — E559 Vitamin D deficiency, unspecified: Secondary | ICD-10-CM

## 2019-12-27 HISTORY — DX: Vitamin D deficiency, unspecified: E55.9

## 2019-12-27 HISTORY — DX: Unspecified abdominal pain: R10.9

## 2020-01-11 ENCOUNTER — Emergency Department (HOSPITAL_COMMUNITY): Payer: Medicaid Other

## 2020-01-11 ENCOUNTER — Emergency Department (HOSPITAL_COMMUNITY)
Admission: EM | Admit: 2020-01-11 | Discharge: 2020-01-11 | Disposition: A | Discharge status: Home / Self Care | Payer: Medicaid Other | Attending: Emergency Medicine | Admitting: Emergency Medicine

## 2020-01-11 DIAGNOSIS — Z794 Long term (current) use of insulin: Secondary | ICD-10-CM | POA: Diagnosis not present

## 2020-01-11 DIAGNOSIS — Z79899 Other long term (current) drug therapy: Secondary | ICD-10-CM | POA: Diagnosis not present

## 2020-01-11 DIAGNOSIS — R109 Unspecified abdominal pain: Secondary | ICD-10-CM

## 2020-01-11 DIAGNOSIS — R0781 Pleurodynia: Secondary | ICD-10-CM | POA: Insufficient documentation

## 2020-01-11 DIAGNOSIS — I10 Essential (primary) hypertension: Secondary | ICD-10-CM | POA: Diagnosis not present

## 2020-01-11 DIAGNOSIS — Z7982 Long term (current) use of aspirin: Secondary | ICD-10-CM | POA: Insufficient documentation

## 2020-01-11 DIAGNOSIS — E119 Type 2 diabetes mellitus without complications: Secondary | ICD-10-CM | POA: Diagnosis not present

## 2020-01-11 DIAGNOSIS — F17210 Nicotine dependence, cigarettes, uncomplicated: Secondary | ICD-10-CM | POA: Insufficient documentation

## 2020-01-11 MED ORDER — NAPROXEN 500 MG PO TABS: 500.0000 mg | ORAL_TABLET | Freq: Two times a day (BID) | ORAL | 6 refills | Status: DC | PRN

## 2020-01-11 MED FILL — NAPROXEN 500 MG TABLET: 500 | 30 days supply | Qty: 30 | Fill #0

## 2020-01-11 NOTE — ED Provider Notes (Signed)
[facility] EMERGENCY DEPARTMENT Provider Note   CSN: [id number] Arrival date & time: 01/11/20  0915     History No chief complaint on file.   Frank Schneider is a 43 y.o. male.  The history is provided by the patient. No language interpreter was used.     44 year old male with history of diabetes, hypertension, prior stroke, presenting complaining of wrist pain.  Patient report gradual onset of pain to his left flank and rib region ongoing for the past 3 months.  Described pain as gradual but has become progressively worse.  He is unable to describe the pain but states that sometimes walking makes the pain worse.  Pain is not associate with fever or chills no lightheadedness or dizziness no cough no nausea vomiting diarrhea dysuria or hematuria.  No prior history of kidney stone.  Denies any injury.  Currently rates his pain as 9 out of 10.  States that he would take over-the-counter medication which initially helps especially NSAIDs but now it does not provide much relief.  He also mention that he was supposed to have an MRI but have not had that done.  He denies any exertional chest pain lightheadedness or dizziness.  Past Medical History:  Diagnosis Date  . Blind right eye   . Diabetes mellitus    Takes Metformin  . History of appendicitis 11/2015  . Hypertension   . Priapism   . Stroke ([place]) 04/2015  . Tobacco use   . Vision abnormalities     Patient Active Problem List   Diagnosis Date Noted  . Anxiety and depression 09/24/2019  . Chronic left flank pain 09/24/2019  . Dysphagia 04/30/2018  . Ischemic stroke ([place]) 04/29/2018  . 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 Acute appendicitis 12/16/2015  . Right cataract 10/04/2015  . Tobacco dependence 10/04/2015  . Absolute anemia 08/07/2015  . Neuropathy 08/07/2015  . Cerebrovascular accident (CVA) ([place])   . Leukocytosis   . Stroke (cerebrum) ([place]) 05/09/2015  . Type 2 diabetes mellitus with circulatory  disorder ([place]) 03/12/2015  . Hyperlipidemia 02/14/2015  . History of CVA (cerebrovascular accident) 02/14/2015  . CVA (cerebral vascular accident) ([place]) 01/01/2015  . Diabetes mellitus type 2 with complications, uncontrolled ([place]) 01/01/2015  . Stroke ([place])   . Essential hypertension   . Smoker   . Tobacco use     Past Surgical History:  Procedure Laterality Date  . APPENDECTOMY    . EYE SURGERY     cataract removed 05/27/2016 right eye   . INCISION AND DRAINAGE PERIRECTAL ABSCESS  07/28/2011   Procedure: IRRIGATION AND DEBRIDEMENT PERIRECTAL ABSCESS;  Surgeon: Adin Hector, MD;  Location: [facility];  Service: General;  Laterality: N/A;   of skin muscle and subcutaneous tissue of perimeum  8cmx12cm area   . IR GENERIC HISTORICAL  01/08/2016   IR RADIOLOGIST EVAL & MGMT 01/08/2016 [facility] INTERV RAD  . LAPAROSCOPIC APPENDECTOMY N/A 12/16/2015   Procedure: APPENDECTOMY LAPAROSCOPIC;  Surgeon: Coralie Keens, MD;  Location: [facility];  Service: General;  Laterality: N/A;  . PENECTOMY         Family History  Problem Relation Age of Onset  . Diabetes Mother   . Hypertension Mother   . Diabetes Maternal Grandmother   . Hypertension Maternal Grandmother   . Depression Maternal Grandmother     Social History   Tobacco Use  . Smoking status: Current Some Day Smoker    Packs/day: 0.25    Years: 8.00  Pack years: 2.00    Types: Cigarettes  . Smokeless tobacco: Never Used  Vaping Use  . Vaping Use: Never used  Substance Use Topics  . Alcohol use: No  . Drug use: Yes    Types: Marijuana    Comment: 3x wk    Home Medications Prior to Admission medications   Medication Sig Start Date End Date Taking? Authorizing Provider  amLODipine (NORVASC) 10 MG tablet Take 1 tablet (10 mg total) by mouth daily. 09/20/19   Azzie Glatter, FNP  aspirin 325 MG tablet Take 1 tablet (325 mg total) by mouth daily. 05/02/18   Raiford Noble Latif, DO  atorvastatin (LIPITOR) 40 MG tablet Take 1  tablet (40 mg total) by mouth daily. 09/20/19 09/19/20  Azzie Glatter, FNP  atropine 1 % ophthalmic ointment Place 1 application into the right eye daily. 09/06/16   Deno Etienne, DO  Blood Glucose Monitoring Suppl (TRUE METRIX AIR GLUCOSE METER) DEVI 1 each by Does not apply route 2 (two) times daily at 10 AM and 5 PM. 06/15/17   Dorena Dew, FNP  ferrous sulfate 325 (65 FE) MG tablet Take 1 tablet (325 mg total) daily with breakfast by mouth. 03/12/17   Dorena Dew, FNP  glucose blood (TRUE METRIX BLOOD GLUCOSE TEST) test strip Use as instructed 07/20/18   Lanae Boast, FNP  HYDROcodone-acetaminophen (NORCO) 5-325 MG tablet Take 1 tablet by mouth every 6 (six) hours as needed for severe pain. 09/18/18   [place], [place], PA-C  insulin glargine (LANTUS) 100 UNIT/ML injection INJECT 30 UNITS INTO THE SKIN AT BEDTIME. 09/20/19   Azzie Glatter, FNP  Insulin Syringe-Needle U-100 (TRUEPLUS INSULIN SYRINGE) 31G X 5/16" 1 ML MISC USE AS DIRECTED. 09/09/18   Lanae Boast, FNP  Lancets (FREESTYLE) lancets Use as instructed 01/03/15   Reyne Dumas, MD  lisinopril (ZESTRIL) 20 MG tablet Take 1 tablet (20 mg total) by mouth daily. 09/20/19   Azzie Glatter, FNP  naproxen (NAPROSYN) 500 MG tablet Take 1 tablet (500 mg total) by mouth 2 (two) times daily as needed for mild pain, moderate pain or headache (TAKE WITH MEALS.). 09/20/19   Azzie Glatter, FNP    Allergies    Patient has no known allergies.  Review of Systems   Review of Systems  All other systems reviewed and are negative.   Physical Exam Updated Vital Signs BP (!) 149/74 (BP Location: Right Arm)   Pulse (!) 51   Temp 98.7 F (37.1 C) (Oral)   Resp 16   Ht 5' 10.5" (1.791 m)   Wt 83.9 kg   SpO2 100%   BMI 26.17 kg/m   Physical Exam Vitals and nursing note reviewed.  Constitutional:      General: He is not in acute distress.    Appearance: He is well-developed.  HENT:     Head: Atraumatic.  Eyes:      Conjunctiva/sclera: Conjunctivae normal.  Cardiovascular:     Rate and Rhythm: Normal rate and regular rhythm.     Pulses: Normal pulses.     Heart sounds: Normal heart sounds.  Pulmonary:     Effort: Pulmonary effort is normal.     Breath sounds: Normal breath sounds. No wheezing, rhonchi or rales.  Chest:     Chest wall: No tenderness.  Abdominal:     General: Abdomen is flat.     Palpations: Abdomen is soft.     Tenderness: There is no abdominal tenderness. There is  no right CVA tenderness, left CVA tenderness, guarding or rebound.  Musculoskeletal:     Cervical back: Neck supple.  Skin:    Findings: No rash.  Neurological:     Mental Status: He is alert. Mental status is at baseline.  Psychiatric:        Mood and Affect: Mood normal.     ED Results / Procedures / Treatments   Labs (all labs ordered are listed, but only abnormal results are displayed) Labs Reviewed - No data to display  EKG None  Radiology DG Ribs Unilateral W/Chest Left  Result Date: 01/11/2020 CLINICAL DATA:  Left lower rib pain EXAM: LEFT RIBS AND CHEST - 3+ VIEW COMPARISON:  12/18/2015 FINDINGS: No fracture or other bone lesions are seen involving the ribs. There is no evidence of pneumothorax or pleural effusion. Both lungs are clear. Heart size and mediastinal contours are within normal limits. IMPRESSION: Normal study. Electronically Signed   By: Rolm Baptise M.D.   On: 01/11/2020 09:47    Procedures Procedures (including critical care time)  Medications Ordered in ED Medications - No data to display  ED Course  I have reviewed the triage vital signs and the nursing notes.  Pertinent labs & imaging results that were available during my care of the patient were reviewed by me and considered in my medical decision making (see chart for details).    MDM Rules/Calculators/A&P                          BP (!) 149/74 (BP Location: Right Arm)   Pulse (!) 51   Temp 98.7 F (37.1 C) (Oral)    Resp 16   Ht 5' 10.5" (1.791 m)   Wt 83.9 kg   SpO2 100%   BMI 26.17 kg/m   Final Clinical Impression(s) / ED Diagnoses Final diagnoses:  Rib pain on left side    Rx / DC Orders ED Discharge Orders         Ordered    naproxen (NAPROSYN) 500 MG tablet  2 times daily PRN        01/11/20 1240         12:37 PM Patient here complaining of pain to his left lower lateral chest wall.  Pain has been an ongoing issue for several months.  I was unable to reproduce his pain.  There is no overlying skin changes.  He does not have any CVA tenderness.  His abdomen is soft and nontender.  Rib x-ray of the left side without any acute finding.  Vital signs stable.  At this time no acute emergent medical condition identified.  Doubt kidney stone. Doubt ACS. I encourage patient to continue to follow-up with his PCP to have further evaluation which may include MRI as previously scheduled.  Will provide NSAIDs for pain.  Return precaution discussed.   Domenic Moras, PA-C 01/11/20 [place], [place], DO 01/11/20 1506

## 2020-01-11 NOTE — ED Triage Notes (Signed)
Pt here with c/o left sided rib pain no trauma noted pt is  suppose  To had an mri but did not o alos out of his b/p meds

## 2020-01-11 NOTE — Discharge Instructions (Signed)
Please follow-up closely with your primary care doctor for further evaluation and managements of your pain.  You may take naproxen as needed for pain. Return if you have any concerns.

## 2020-01-11 NOTE — ED Notes (Addendum)
Pt is smoker; pain for 3 mos

## 2020-01-12 ENCOUNTER — Ambulatory Visit: Payer: Medicaid Other | Admitting: Family Medicine

## 2020-01-15 ENCOUNTER — Telehealth: Payer: Self-pay

## 2020-01-15 NOTE — Telephone Encounter (Addendum)
Transition Care Management Unsuccessful Follow-up Telephone Call  Date of discharge and from where:  01/11/2020 from [facility]  Attempts:  1st Attempt  Reason for unsuccessful TCM follow-up call:  Left voice message

## 2020-01-16 NOTE — Telephone Encounter (Addendum)
Transition Care Management Unsuccessful Follow-up Telephone Call  Date of discharge and from where:  01/11/2020 from [facility]  Attempts:  2nd Attempt  Reason for unsuccessful TCM follow-up call:  Left voice message

## 2020-01-17 NOTE — Telephone Encounter (Addendum)
Transition Care Management Unsuccessful Follow-up Telephone Call  Date of discharge and from where:  01/11/2020 from [facility]  Attempts:  3rd Attempt  Reason for unsuccessful TCM follow-up call:  Left voice message

## 2020-01-21 ENCOUNTER — Other Ambulatory Visit: Payer: Self-pay

## 2020-01-21 DIAGNOSIS — Z5321 Procedure and treatment not carried out due to patient leaving prior to being seen by health care provider: Secondary | ICD-10-CM | POA: Diagnosis not present

## 2020-01-21 DIAGNOSIS — R030 Elevated blood-pressure reading, without diagnosis of hypertension: Secondary | ICD-10-CM | POA: Insufficient documentation

## 2020-01-21 DIAGNOSIS — R0781 Pleurodynia: Secondary | ICD-10-CM | POA: Diagnosis present

## 2020-01-21 LAB — COMPREHENSIVE METABOLIC PANEL
ALT: 20 U/L (ref 0–44)
AST: 23 U/L (ref 15–41)
Albumin: 5 g/dL (ref 3.5–5.0)
Alkaline Phosphatase: 58 U/L (ref 38–126)
Anion gap: 15 (ref 5–15)
BUN: 12 mg/dL (ref 6–20)
CO2: 25 mmol/L (ref 22–32)
Calcium: 9.3 mg/dL (ref 8.9–10.3)
Chloride: 101 mmol/L (ref 98–111)
Creatinine, Ser: 0.85 mg/dL (ref 0.61–1.24)
GFR calc Af Amer: >60 mL/min (ref >60)
GFR calc non Af Amer: >60 mL/min (ref >60)
Glucose, Bld: 69 mg/dL — ABNORMAL LOW (ref 70–99)
Potassium: 4 mmol/L (ref 3.5–5.1)
Sodium: 141 mmol/L (ref 135–145)
Total Bilirubin: 0.6 mg/dL (ref 0.3–1.2)
Total Protein: 8.1 g/dL (ref 6.5–8.1)

## 2020-01-21 LAB — CBC
HCT: 40.9 % (ref 39.0–52.0)
Hemoglobin: 12.8 g/dL — ABNORMAL LOW (ref 13.0–17.0)
MCH: 19.9 pg — ABNORMAL LOW (ref 26.0–34.0)
MCHC: 31.3 g/dL (ref 30.0–36.0)
MCV: 63.7 fL — ABNORMAL LOW (ref 80.0–100.0)
Platelets: 176 10*3/uL (ref 150–400)
RBC: 6.42 MIL/uL — ABNORMAL HIGH (ref 4.22–5.81)
RDW: 17.6 % — ABNORMAL HIGH (ref 11.5–15.5)
WBC: 11.5 10*3/uL — ABNORMAL HIGH (ref 4.0–10.5)
nRBC: 0 % (ref 0.0–0.2)

## 2020-01-21 NOTE — ED Triage Notes (Signed)
Patient reports to the ER for left sided rib pain. Patient reports he has had no trauma to the left side but has been having pain. Patient's BP also elevated at this time. Patient reports he did not go to his MRI apt.

## 2020-01-22 ENCOUNTER — Ambulatory Visit (INDEPENDENT_AMBULATORY_CARE_PROVIDER_SITE_OTHER): Payer: Medicaid Other | Admitting: Family Medicine

## 2020-01-22 ENCOUNTER — Emergency Department (HOSPITAL_COMMUNITY)
Admission: EM | Admit: 2020-01-22 | Discharge: 2020-01-22 | Disposition: A | Discharge status: Home / Self Care | Payer: Medicaid Other | Attending: Emergency Medicine | Admitting: Emergency Medicine

## 2020-01-22 ENCOUNTER — Encounter: Payer: Self-pay | Admitting: Family Medicine

## 2020-01-22 VITALS — BP 148/83 | HR 60 | Temp 98.7°F | Resp 17 | Ht 70.0 in | Wt 177.6 lb

## 2020-01-22 DIAGNOSIS — G8929 Other chronic pain: Secondary | ICD-10-CM | POA: Diagnosis not present

## 2020-01-22 DIAGNOSIS — R10A2 Flank pain, left side: Secondary | ICD-10-CM

## 2020-01-22 DIAGNOSIS — R739 Hyperglycemia, unspecified: Secondary | ICD-10-CM

## 2020-01-22 DIAGNOSIS — I10 Essential (primary) hypertension: Secondary | ICD-10-CM

## 2020-01-22 DIAGNOSIS — F419 Anxiety disorder, unspecified: Secondary | ICD-10-CM

## 2020-01-22 DIAGNOSIS — R109 Unspecified abdominal pain: Secondary | ICD-10-CM

## 2020-01-22 DIAGNOSIS — R7309 Other abnormal glucose: Secondary | ICD-10-CM

## 2020-01-22 DIAGNOSIS — Z794 Long term (current) use of insulin: Secondary | ICD-10-CM | POA: Diagnosis not present

## 2020-01-22 DIAGNOSIS — E1165 Type 2 diabetes mellitus with hyperglycemia: Secondary | ICD-10-CM

## 2020-01-22 DIAGNOSIS — F329 Major depressive disorder, single episode, unspecified: Secondary | ICD-10-CM | POA: Diagnosis not present

## 2020-01-22 DIAGNOSIS — E118 Type 2 diabetes mellitus with unspecified complications: Secondary | ICD-10-CM | POA: Diagnosis not present

## 2020-01-22 DIAGNOSIS — Z09 Encounter for follow-up examination after completed treatment for conditions other than malignant neoplasm: Secondary | ICD-10-CM

## 2020-01-22 LAB — POCT URINALYSIS DIPSTICK
Bilirubin, UA: NEGATIVE
Glucose, UA: NEGATIVE
Ketones, UA: NEGATIVE
Leukocytes, UA: NEGATIVE
Nitrite, UA: NEGATIVE
Protein, UA: POSITIVE — AB
Spec Grav, UA: >=1.03 — AB (ref 1.010–1.025)
Urobilinogen, UA: 1 E.U./dL
pH, UA: 5.5 (ref 5.0–8.0)

## 2020-01-22 LAB — GLUCOSE, POCT (MANUAL RESULT ENTRY): POC Glucose: 161 mg/dl — AB (ref 70–99)

## 2020-01-22 LAB — POCT GLYCOSYLATED HEMOGLOBIN (HGB A1C)
HbA1c POC (<> result, manual entry): 7.6 % (ref 4.0–5.6)
HbA1c, POC (controlled diabetic range): 7.6 % — AB (ref 0.0–7.0)
HbA1c, POC (prediabetic range): 7.6 % — AB (ref 5.7–6.4)
Hemoglobin A1C: 7.6 % — AB (ref 4.0–5.6)

## 2020-01-22 MED ORDER — ACETAMINOPHEN 500 MG PO TABS: ORAL_TABLET | ORAL | 3 refills | Status: DC

## 2020-01-22 MED FILL — LISINOPRIL 20 MG TABLET: 20 | 90 days supply | Qty: 90 | Fill #1

## 2020-01-22 MED FILL — LANTUS 100 UNITS/ML VIAL: 100 | 28 days supply | Qty: 10 | Fill #1

## 2020-01-22 NOTE — Progress Notes (Signed)
Patient [facility] Follow Up  Subjective:  Patient ID: Frank Schneider, male    DOB: 05-31-1975  Age: 44 y.o. MRN: [id number]  CC:  Chief Complaint  Patient presents with  . Pain    Pt states he is here bcause of Kidney, lung or pancresos pain. X53month for really bad pain and x54months of average pain.    HPI Frank Schneider is a 44 year old male who presents for Follow UP today.    Patient Active Problem List   Diagnosis Date Noted  . Anxiety and depression 09/24/2019  . Chronic left flank pain 09/24/2019  . Dysphagia 04/30/2018  . Ischemic stroke ([place]) 04/29/2018  . 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 Acute appendicitis 12/16/2015  . Right cataract 10/04/2015  . Tobacco dependence 10/04/2015  . Absolute anemia 08/07/2015  . Neuropathy 08/07/2015  . Cerebrovascular accident (CVA) ([place])   . Leukocytosis   . Stroke (cerebrum) ([place]) 05/09/2015  . 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 Hyperlipidemia 02/14/2015  . History of CVA (cerebrovascular accident) 02/14/2015  . CVA (cerebral vascular accident) ([place]) 01/01/2015  . Diabetes mellitus type 2 with complications, uncontrolled ([place]) 01/01/2015  . Stroke ([place])   . Essential hypertension   . Smoker   . Tobacco use    Current Status: Since his last office visit, he is doing well with no complaints. He has c/o chronic left flank pain. We previously ordered Renal Ultrasound to further assess, but patient has not followed through as of yet. He denies fevers, chills, fatigue, recent infections, weight loss, and night sweats. He has not had any headaches, visual changes, dizziness, and falls. No chest pain, heart palpitations, cough and shortness of breath reported. Denies GI problems such as nausea, vomiting, diarrhea, and constipation. He has no reports of blood in stools, dysuria and hematuria. No depression or anxiety reported today. He is taking all  medications as prescribed.   Past Medical History:  Diagnosis Date  . Blind right eye   . Diabetes mellitus    Takes Metformin  . History of appendicitis 11/2015  . Hypertension   . Left flank pain 12/2019  . Priapism   . Stroke ([place]) 04/2015  . Tobacco use   . Vision abnormalities   . Vitamin D deficiency 12/2019    Past Surgical History:  Procedure Laterality Date  . APPENDECTOMY    . EYE SURGERY     cataract removed 05/27/2016 right eye   . INCISION AND DRAINAGE PERIRECTAL ABSCESS  07/28/2011   Procedure: IRRIGATION AND DEBRIDEMENT PERIRECTAL ABSCESS;  Surgeon: Adin Hector, MD;  Location: [facility];  Service: General;  Laterality: N/A;   of skin muscle and subcutaneous tissue of perimeum  8cmx12cm area   . IR GENERIC HISTORICAL  01/08/2016   IR RADIOLOGIST EVAL & MGMT 01/08/2016 [facility] INTERV RAD  . LAPAROSCOPIC APPENDECTOMY N/A 12/16/2015   Procedure: APPENDECTOMY LAPAROSCOPIC;  Surgeon: Coralie Keens, MD;  Location: [facility];  Service: General;  Laterality: N/A;  . PENECTOMY      Family History  Problem Relation Age of Onset  . Diabetes Mother   . Hypertension Mother   . Diabetes Maternal Grandmother   . Hypertension Maternal Grandmother   . Depression Maternal Grandmother     Social History   Socioeconomic History  . Marital status: Single    Spouse name: Not on file  . Number of children: Not on file  .  Years of education: Not on file  . Highest education level: Not on file  Occupational History  . Not on file  Tobacco Use  . Smoking status: Current Some Day Smoker    Packs/day: 0.25    Years: 8.00    Pack years: 2.00    Types: Cigarettes  . Smokeless tobacco: Never Used  Vaping Use  . Vaping Use: Never used  Substance and Sexual Activity  . Alcohol use: No  . Drug use: Yes    Types: Marijuana    Comment: 3x wk  . Sexual activity: Yes  Other Topics Concern  . Not on file  Social History Narrative  . Not on file   Social Determinants of Health    Financial Resource Strain:   . Difficulty of Paying Living Expenses: Not on file  Food Insecurity:   . Worried About Charity fundraiser in the Last Year: Not on file  . Ran Out of Food in the Last Year: Not on file  Transportation Needs:   . Lack of Transportation (Medical): Not on file  . Lack of Transportation (Non-Medical): Not on file  Physical Activity:   . Days of Exercise per Week: Not on file  . Minutes of Exercise per Session: Not on file  Stress:   . Feeling of Stress : Not on file  Social Connections:   . Frequency of Communication with Friends and Family: Not on file  . Frequency of Social Gatherings with Friends and Family: Not on file  . Attends Religious Services: Not on file  . Active Member of Clubs or Organizations: Not on file  . Attends Archivist Meetings: Not on file  . Marital Status: Not on file  Intimate Partner Violence:   . Fear of Current or Ex-Partner: Not on file  . Emotionally Abused: Not on file  . Physically Abused: Not on file  . Sexually Abused: Not on file    Outpatient Medications Prior to Visit  Medication Sig Dispense Refill  . aspirin 325 MG tablet Take 1 tablet (325 mg total) by mouth daily. 30 tablet 0  . atorvastatin (LIPITOR) 40 MG tablet Take 1 tablet (40 mg total) by mouth daily. 30 tablet 11  . Blood Glucose Monitoring Suppl (TRUE METRIX AIR GLUCOSE METER) DEVI 1 each by Does not apply route 2 (two) times daily at 10 AM and 5 PM. 1 Device 0  . insulin glargine (LANTUS) 100 UNIT/ML injection INJECT 30 UNITS INTO THE SKIN AT BEDTIME. 10 mL 11  . Insulin Syringe-Needle U-100 (TRUEPLUS INSULIN SYRINGE) 31G X 5/16" 1 ML MISC USE AS DIRECTED. 100 each 3  . Lancets (FREESTYLE) lancets Use as instructed 100 each 12  . lisinopril (ZESTRIL) 20 MG tablet Take 1 tablet (20 mg total) by mouth daily. 30 tablet 11  . naproxen (NAPROSYN) 500 MG tablet Take 1 tablet (500 mg total) by mouth 2 (two) times daily as needed for mild pain or  moderate pain (TAKE WITH MEALS.). 30 tablet 6  . amLODipine (NORVASC) 10 MG tablet Take 1 tablet (10 mg total) by mouth daily. (Patient not taking: Reported on 01/22/2020) 30 tablet 11  . atropine 1 % ophthalmic ointment Place 1 application into the right eye daily. (Patient not taking: Reported on 01/22/2020) 3.5 g 12  . ferrous sulfate 325 (65 FE) MG tablet Take 1 tablet (325 mg total) daily with breakfast by mouth. (Patient not taking: Reported on 01/22/2020) 30 tablet 3  . glucose blood (TRUE  METRIX BLOOD GLUCOSE TEST) test strip Use as instructed (Patient not taking: Reported on 01/22/2020) 100 each 12  . HYDROcodone-acetaminophen (NORCO) 5-325 MG tablet Take 1 tablet by mouth every 6 (six) hours as needed for severe pain. (Patient not taking: Reported on 01/22/2020) 10 tablet 0   No facility-administered medications prior to visit.    No Known Allergies  ROS Review of Systems  Constitutional: Negative.   HENT: Negative.   Eyes: Negative.   Respiratory: Negative.   Cardiovascular: Negative.   Gastrointestinal: Negative.   Endocrine: Negative.   Genitourinary: Positive for flank pain (left flank pain).  Musculoskeletal: Positive for arthralgias (generalized ).  Skin: Negative.   Allergic/Immunologic: Negative.   Neurological: Positive for dizziness (occasional ) and headaches (occasional ).  Hematological: Negative.   Psychiatric/Behavioral: Negative.       Objective:    Physical Exam Vitals and nursing note reviewed.  Constitutional:      Appearance: Normal appearance.  HENT:     Head: Normocephalic and atraumatic.     Nose: Nose normal.     Mouth/Throat:     Mouth: Mucous membranes are moist.     Pharynx: Oropharynx is clear.  Cardiovascular:     Rate and Rhythm: Normal rate and regular rhythm.     Pulses: Normal pulses.     Heart sounds: Normal heart sounds.  Pulmonary:     Effort: Pulmonary effort is normal.  Abdominal:     General: Bowel sounds are normal.      Palpations: Abdomen is soft.  Musculoskeletal:        General: Normal range of motion.     Cervical back: Normal range of motion and neck supple.  Skin:    General: Skin is warm.  Neurological:     General: No focal deficit present.     Mental Status: He is alert and oriented to person, place, and time.  Psychiatric:        Mood and Affect: Mood normal.        Behavior: Behavior normal.        Thought Content: Thought content normal.        Judgment: Judgment normal.    BP (!) 148/83 (BP Location: Right Arm, Patient Position: Sitting, Cuff Size: Normal)   Pulse 60   Temp 98.7 F (37.1 C)   Resp 17   Wt 177 lb 9.6 oz (80.6 kg)   SpO2 100%   BMI 25.12 kg/m  Wt Readings from Last 3 Encounters:  01/22/20 177 lb 9.6 oz (80.6 kg)  01/21/20 190 lb (86.2 kg)  01/11/20 185 lb (83.9 kg)     Health Maintenance Due  Topic Date Due  . Hepatitis C Screening  Never done  . COVID-19 Vaccine (1) Never done  . OPHTHALMOLOGY EXAM  09/23/2018    There are no preventive care reminders to display for this patient.  Lab Results  Component Value Date   TSH 1.450 01/22/2020   Lab Results  Component Value Date   WBC 11.5 (H) 01/21/2020   HGB 12.8 (L) 01/21/2020   HCT 40.9 01/21/2020   MCV 63.7 (L) 01/21/2020   PLT 176 01/21/2020   Lab Results  Component Value Date   NA 141 01/21/2020   K 4.0 01/21/2020   CO2 25 01/21/2020   GLUCOSE 69 (L) 01/21/2020   BUN 12 01/21/2020   CREATININE 0.85 01/21/2020   BILITOT 0.6 01/21/2020   ALKPHOS 58 01/21/2020   AST 23 01/21/2020   ALT  20 01/21/2020   PROT 8.1 01/21/2020   ALBUMIN 5.0 01/21/2020   CALCIUM 9.3 01/21/2020   ANIONGAP 15 01/21/2020   Lab Results  Component Value Date   CHOL 109 01/22/2020   Lab Results  Component Value Date   HDL 38 (L) 01/22/2020   Lab Results  Component Value Date   LDLCALC 54 01/22/2020   Lab Results  Component Value Date   TRIG 88 01/22/2020   Lab Results  Component Value Date   CHOLHDL  2.9 01/22/2020   Lab Results  Component Value Date   HGBA1C 7.6 (A) 01/22/2020   HGBA1C 7.6 01/22/2020   HGBA1C 7.6 (A) 01/22/2020   HGBA1C 7.6 (A) 01/22/2020      Assessment & Plan:   1. Type 2 diabetes mellitus with complication, with long-term current use of insulin ([place]) He will continue medication as prescribed, to decrease foods/beverages high in sugars and carbs and follow Heart Healthy or DASH diet. Increase physical activity to at least 30 minutes cardio exercise daily.  - POC Glucose (CBG) - POC HgB A1c - TSH - Vitamin D, 25-hydroxy - Vitamin B12 - Lipid Panel - Urinalysis Dipstick  2. Hemoglobin A1c between 7.0% and 9.0% Hgb stable at 7.6. Monitor.   3. Left flank pain, chronic - acetaminophen (TYLENOL) 500 MG tablet; Take 2 tablets (Total= 1,000 mg), by mouth, 2 times a day as needed.  Dispense: 120 tablet; Refill: 3  4. Essential hypertension The current medical regimen is effective; blood pressure is stable at 148/83 today; continue present plan and medications as prescribed. He will continue to take medications as prescribed, to decrease high sodium intake, excessive alcohol intake, increase potassium intake, smoking cessation, and increase physical activity of at least 30 minutes of cardio activity daily. He will continue to follow Heart Healthy or DASH diet.  5. Anxiety and depression  6. Hyperglycemia  7. Follow up He will follow up in 6 months.   Meds ordered this encounter  Medications  . acetaminophen (TYLENOL) 500 MG tablet    Sig: Take 2 tablets (Total= 1,000 mg), by mouth, 2 times a day as needed.    Dispense:  120 tablet    Refill:  3    Orders Placed This Encounter  Procedures  . TSH  . Vitamin D, 25-hydroxy  . Vitamin B12  . Lipid Panel  . POC Glucose (CBG)  . POC HgB A1c  . Urinalysis Dipstick    Referral Orders  No referral(s) requested today    Kathe Becton,  MSN, FNP-BC [place],  [phone] fax   Problem List Items Addressed This Visit      Cardiovascular and Mediastinum   Essential hypertension     Other   Anxiety and depression   Tobacco use    Other Visit Diagnoses    Type 2 diabetes mellitus with hyperglycemia, without long-term current use of insulin ([place])    -  Primary   Relevant Orders   POC Glucose (CBG) (Completed)   POC HgB A1c (Completed)   TSH (Completed)   Vitamin D, 25-hydroxy (Completed)   Vitamin B12 (Completed)   Lipid Panel (Completed)   Urinalysis Dipstick (Completed)   Hemoglobin A1c between 7.0% and 9.0%       Left flank pain, chronic       Relevant Medications   acetaminophen (TYLENOL) 500 MG tablet   Hyperglycemia  Alcohol use       Follow up          Meds ordered this encounter  Medications  . acetaminophen (TYLENOL) 500 MG tablet    Sig: Take 2 tablets (Total= 1,000 mg), by mouth, 2 times a day as needed.    Dispense:  120 tablet    Refill:  3    Follow-up: Return in about 6 months (around 07/21/2020).    Azzie Glatter, FNP

## 2020-01-23 ENCOUNTER — Encounter: Payer: Self-pay | Admitting: Family Medicine

## 2020-01-23 ENCOUNTER — Ambulatory Visit (HOSPITAL_COMMUNITY)
Admission: RE | Admit: 2020-01-23 | Discharge: 2020-01-23 | Disposition: A | Discharge status: Home / Self Care | Payer: Medicaid Other | Source: Ambulatory Visit | Attending: Family Medicine | Admitting: Family Medicine

## 2020-01-23 ENCOUNTER — Other Ambulatory Visit: Payer: Self-pay | Admitting: Family Medicine

## 2020-01-23 ENCOUNTER — Other Ambulatory Visit: Payer: Self-pay

## 2020-01-23 ENCOUNTER — Telehealth: Payer: Self-pay

## 2020-01-23 ENCOUNTER — Other Ambulatory Visit: Payer: Medicaid Other

## 2020-01-23 DIAGNOSIS — H5440 Blindness, one eye, unspecified eye: Secondary | ICD-10-CM

## 2020-01-23 DIAGNOSIS — G8929 Other chronic pain: Secondary | ICD-10-CM | POA: Insufficient documentation

## 2020-01-23 DIAGNOSIS — Z20822 Contact with and (suspected) exposure to covid-19: Secondary | ICD-10-CM | POA: Diagnosis not present

## 2020-01-23 DIAGNOSIS — N390 Urinary tract infection, site not specified: Secondary | ICD-10-CM

## 2020-01-23 DIAGNOSIS — E559 Vitamin D deficiency, unspecified: Secondary | ICD-10-CM

## 2020-01-23 DIAGNOSIS — R109 Unspecified abdominal pain: Secondary | ICD-10-CM

## 2020-01-23 DIAGNOSIS — R10A2 Flank pain, left side: Secondary | ICD-10-CM

## 2020-01-23 LAB — LIPID PANEL
Chol/HDL Ratio: 2.9 ratio (ref 0.0–5.0)
Cholesterol, Total: 109 mg/dL (ref 100–199)
HDL: 38 mg/dL — ABNORMAL LOW (ref >39)
LDL Chol Calc (NIH): 54 mg/dL (ref 0–99)
Triglycerides: 88 mg/dL (ref 0–149)
VLDL Cholesterol Cal: 17 mg/dL (ref 5–40)

## 2020-01-23 LAB — VITAMIN B12: Vitamin B-12: 525 pg/mL (ref 232–1245)

## 2020-01-23 LAB — TSH: TSH: 1.45 u[IU]/mL (ref 0.450–4.500)

## 2020-01-23 LAB — VITAMIN D 25 HYDROXY (VIT D DEFICIENCY, FRACTURES): Vit D, 25-Hydroxy: 13.8 ng/mL — ABNORMAL LOW (ref 30.0–100.0)

## 2020-01-23 MED ORDER — CYCLOBENZAPRINE HCL 10 MG PO TABS: 10.0000 mg | ORAL_TABLET | Freq: Two times a day (BID) | ORAL | 3 refills | Status: DC | PRN

## 2020-01-23 MED ORDER — SULFAMETHOXAZOLE-TRIMETHOPRIM 800-160 MG PO TABS: 1.0000 | ORAL_TABLET | Freq: Two times a day (BID) | ORAL | 0 refills | Status: DC

## 2020-01-23 MED ORDER — VITAMIN D (ERGOCALCIFEROL) 1.25 MG (50000 UNIT) PO CAPS: 50000.0000 [IU] | ORAL_CAPSULE | ORAL | 3 refills | Status: DC

## 2020-01-23 MED FILL — SULFAMETHOXAZOLE-TMP DS TAB: 800-160 | 7 days supply | Qty: 14 | Fill #0

## 2020-01-23 MED FILL — AMLODIPINE BESYLATE 10 MG T: 10 | 30 days supply | Qty: 30 | Fill #1

## 2020-01-23 MED FILL — CYCLOBENZAPRINE 10 MG TAB: 10 | 21 days supply | Qty: 42 | Fill #0

## 2020-01-23 MED FILL — VIT D2 1.25 MG (50,000 UNIT: 1.25 MG | 35 days supply | Qty: 5 | Fill #0

## 2020-01-23 MED FILL — ATORVASTATIN CALCIUM 40 MG: 40 | 30 days supply | Qty: 30 | Fill #1

## 2020-01-23 NOTE — Progress Notes (Signed)
Pt PA for a renal study was approved today @ 4:37pm. #[id number]

## 2020-01-23 NOTE — Telephone Encounter (Signed)
-----   Message from Azzie Glatter, [place] sent at 01/23/2020  9:19 AM EDT ----- Vitamin D level is moderately low. Rx for Vitamin D supplement sent to pharmacy today. He should include foods that are high in Vitamin D. These include: Salmon, Cod Liver Oil, Mushrooms, Canned Fish, Milk, and Egg Yolks.  All other labs are stable. Keep follow up appointment.

## 2020-01-23 NOTE — Progress Notes (Signed)
Pt was called to discuss his lab results he stated he didn't like the results and that he think he has a kidney infection. Pt was told per  NP Lanelle Bal that some antibiotics and bactrim will be sent to his office.

## 2020-01-25 ENCOUNTER — Encounter: Payer: Self-pay | Admitting: Family Medicine

## 2020-01-25 ENCOUNTER — Other Ambulatory Visit: Payer: Self-pay | Admitting: Critical Care Medicine

## 2020-01-25 DIAGNOSIS — I10 Essential (primary) hypertension: Secondary | ICD-10-CM

## 2020-01-25 DIAGNOSIS — I63439 Cerebral infarction due to embolism of unspecified posterior cerebral artery: Secondary | ICD-10-CM

## 2020-01-25 DIAGNOSIS — U071 COVID-19: Secondary | ICD-10-CM

## 2020-01-25 LAB — NOVEL CORONAVIRUS, NAA: SARS-CoV-2, NAA: DETECTED — AB

## 2020-01-25 LAB — SARS-COV-2, NAA 2 DAY TAT

## 2020-01-25 NOTE — Progress Notes (Signed)
I connected by phone with Frank Schneider on 01/25/2020 at 8:25 PM to discuss the potential use of a new treatment for mild to moderate COVID-19 viral infection in non-hospitalized patients.  This patient is a 45 y.o. male that meets the FDA criteria for Emergency Use Authorization of COVID monoclonal antibody casirivimab/imdevimab or bamlanivimab/eteseviamb.  Has a (+) direct SARS-CoV-2 viral test result  Has mild or moderate COVID-19   Is NOT hospitalized due to COVID-19  Is within 10 days of symptom onset  Has at least one of the high risk factor(s) for progression to severe COVID-19 and/or hospitalization as defined in EUA.  Specific high risk criteria : BMI > 25, Diabetes and Cardiovascular disease or hypertension   I have spoken and communicated the following to the patient or parent/caregiver regarding COVID monoclonal antibody treatment:  1. FDA has authorized the emergency use for the treatment of mild to moderate COVID-19 in adults and pediatric patients with positive results of direct SARS-CoV-2 viral testing who are 7 years of age and older weighing at least 40 kg, and who are at high risk for progressing to severe COVID-19 and/or hospitalization.  2. The significant known and potential risks and benefits of COVID monoclonal antibody, and the extent to which such potential risks and benefits are unknown.  3. Information on available alternative treatments and the risks and benefits of those alternatives, including clinical trials.  4. Patients treated with COVID monoclonal antibody should continue to self-isolate and use infection control measures (e.g., wear mask, isolate, social distance, avoid sharing personal items, clean and disinfect "high touch" surfaces, and frequent handwashing) according to CDC guidelines.   5. The patient or parent/caregiver has the option to accept or refuse COVID monoclonal antibody treatment.  After reviewing this information with the patient,  the patient has agreed to receive one of the available covid 19 monoclonal antibodies and will be provided an appropriate fact sheet prior to infusion. [name], MD 01/25/2020 8:25 PM

## 2020-01-26 ENCOUNTER — Ambulatory Visit (HOSPITAL_COMMUNITY)
Admission: RE | Admit: 2020-01-26 | Discharge: 2020-01-26 | Disposition: A | Discharge status: Home / Self Care | Payer: Medicaid Other | Source: Ambulatory Visit | Attending: Pulmonary Disease | Admitting: Pulmonary Disease

## 2020-01-26 DIAGNOSIS — U071 COVID-19: Secondary | ICD-10-CM | POA: Diagnosis present

## 2020-01-26 DIAGNOSIS — I10 Essential (primary) hypertension: Secondary | ICD-10-CM | POA: Insufficient documentation

## 2020-01-26 DIAGNOSIS — I63439 Cerebral infarction due to embolism of unspecified posterior cerebral artery: Secondary | ICD-10-CM | POA: Diagnosis present

## 2020-01-26 MED ORDER — EPINEPHRINE 0.3 MG/0.3ML IJ SOAJ: 0.3000 mg | Freq: Once | INTRAMUSCULAR | Status: DC | PRN

## 2020-01-26 MED ORDER — METHYLPREDNISOLONE SODIUM SUCC 125 MG IJ SOLR: 125.0000 mg | Freq: Once | INTRAMUSCULAR | Status: DC | PRN

## 2020-01-26 MED ORDER — SODIUM CHLORIDE 0.9 % IV SOLN: INTRAVENOUS | Status: DC | PRN

## 2020-01-26 MED ORDER — FAMOTIDINE IN NACL 20-0.9 MG/50ML-% IV SOLN: 20.0000 mg | Freq: Once | INTRAVENOUS | Status: DC | PRN

## 2020-01-26 MED ORDER — ALBUTEROL SULFATE HFA 108 (90 BASE) MCG/ACT IN AERS: 2.0000 | INHALATION_SPRAY | Freq: Once | RESPIRATORY_TRACT | Status: DC | PRN

## 2020-01-26 MED ORDER — SODIUM CHLORIDE 0.9 % IV SOLN
1200.0000 mg | Freq: Once | INTRAVENOUS | Status: AC
  Administered 2020-01-26: 1200 mg via INTRAVENOUS

## 2020-01-26 MED ORDER — DIPHENHYDRAMINE HCL 50 MG/ML IJ SOLN: 50.0000 mg | Freq: Once | INTRAMUSCULAR | Status: DC | PRN

## 2020-01-26 NOTE — Discharge Instructions (Signed)

## 2020-01-26 NOTE — Progress Notes (Signed)
  Diagnosis: COVID-19  Physician: Joya Gaskins, MD  Procedure: [facility] Med: casirivimab\imdevimab infusion - Provided patient with casirivimab\imdevimab fact sheet for patients, parents and caregivers prior to infusion.  Complications: No immediate complications noted.  Discharge: Discharged home   [place] 01/26/2020

## 2020-01-30 ENCOUNTER — Telehealth: Payer: Self-pay | Admitting: Family Medicine

## 2020-01-30 NOTE — Telephone Encounter (Signed)
Attempted to contact patient to discuss recent scan and pain issues. Unable to leave voice message.

## 2020-01-31 ENCOUNTER — Other Ambulatory Visit: Payer: Self-pay | Admitting: Family Medicine

## 2020-01-31 DIAGNOSIS — R10A2 Flank pain, left side: Secondary | ICD-10-CM

## 2020-01-31 DIAGNOSIS — G8929 Other chronic pain: Secondary | ICD-10-CM

## 2020-02-06 ENCOUNTER — Encounter: Payer: Self-pay | Admitting: Family Medicine

## 2020-02-06 ENCOUNTER — Telehealth: Payer: Self-pay | Admitting: Family Medicine

## 2020-02-06 NOTE — Telephone Encounter (Signed)
Done

## 2020-02-08 ENCOUNTER — Encounter: Payer: Self-pay | Admitting: Family Medicine

## 2020-02-19 ENCOUNTER — Encounter: Payer: Self-pay | Admitting: Family Medicine

## 2020-02-23 NOTE — Telephone Encounter (Signed)
Pt was called to discuss his problem but his phone stated his VM is not set up yet.

## 2020-02-23 NOTE — Telephone Encounter (Signed)
PT referral is in

## 2020-02-28 ENCOUNTER — Other Ambulatory Visit: Payer: Self-pay | Admitting: Physical Medicine and Rehabilitation

## 2020-02-28 ENCOUNTER — Other Ambulatory Visit: Payer: Self-pay

## 2020-02-28 ENCOUNTER — Encounter
Payer: Medicaid Other | Attending: Physical Medicine and Rehabilitation | Admitting: Physical Medicine and Rehabilitation

## 2020-02-28 ENCOUNTER — Encounter: Payer: Self-pay | Admitting: Physical Medicine and Rehabilitation

## 2020-02-28 VITALS — BP 175/96 | HR 84 | Temp 98.5°F | Ht 70.0 in | Wt 182.0 lb

## 2020-02-28 DIAGNOSIS — Z8673 Personal history of transient ischemic attack (TIA), and cerebral infarction without residual deficits: Secondary | ICD-10-CM | POA: Diagnosis present

## 2020-02-28 DIAGNOSIS — R109 Unspecified abdominal pain: Secondary | ICD-10-CM | POA: Insufficient documentation

## 2020-02-28 DIAGNOSIS — E559 Vitamin D deficiency, unspecified: Secondary | ICD-10-CM | POA: Diagnosis present

## 2020-02-28 MED ORDER — LIDOCAINE 5 % EX PTCH: 1.0000 | MEDICATED_PATCH | CUTANEOUS | 0 refills | Status: DC

## 2020-02-28 MED ORDER — MELOXICAM 15 MG PO TBDP: 15.0000 mg | ORAL_TABLET | Freq: Every day | ORAL | 1 refills | Status: DC | PRN

## 2020-02-28 MED FILL — MELOXICAM 15 MG TABLET: 15 | 30 days supply | Qty: 30 | Fill #0

## 2020-02-28 NOTE — Progress Notes (Signed)
Subjective:    Patient ID: Frank Schneider, male    DOB: [birth year], 44 y.o.   MRN: [id number]  HPI  Mr. Sens is a 44 year old man who presents with rib pain for some time. He had had imaging and no abnormalities were conveyed to him. Average pain is 9/10 and 8/10 right now. He noted that he was Vitamin D deficient and has been taking Vitamin D. He still has the pain. He tries to do stretches but this makes the pain worst. There was no inciting event. The pain has gradually gotten worsen. In the beginning he took Tylenol and it went away but they do not help anymore. The pain is aching. He takes Ibuprofen which eases the pain a little. Muscle relaxers don't help.   Pain Inventory Average Pain 9 Pain Right Now 8 My pain is aching  In the last 24 hours, has pain interfered with the following? General activity 9 Relation with others 9 Enjoyment of life 9 What TIME of day is your pain at its worst? morning  Sleep (in general) NA  Pain is worse with: some activites Pain improves with: other Relief from Meds: 2  walk without assistance how many minutes can you walk? 10 ability to climb steps?  yes do you drive?  no  disabled: date disabled 11/2019 I need assistance with the following:  meal prep, household duties and shopping  weakness numbness tingling trouble walking spasms dizziness  Any changes since last visit?  yes has had ultrasound of abd/flank  Primary care Kathe Becton NP    Family History  Problem Relation Age of Onset  . Diabetes Mother   . Hypertension Mother   . Diabetes Maternal Grandmother   . Hypertension Maternal Grandmother   . Depression Maternal Grandmother    Social History   Socioeconomic History  . Marital status: Single    Spouse name: Not on file  . Number of children: Not on file  . Years of education: Not on file  . Highest education level: Not on file  Occupational History  . Not on file  Tobacco Use  . Smoking status:  Current Some Day Smoker    Packs/day: 0.25    Years: 8.00    Pack years: 2.00    Types: Cigarettes  . Smokeless tobacco: Never Used  Vaping Use  . Vaping Use: Never used  Substance and Sexual Activity  . Alcohol use: No  . Drug use: Yes    Types: Marijuana    Comment: 3x wk  . Sexual activity: Yes  Other Topics Concern  . Not on file  Social History Narrative  . Not on file   Social Determinants of Health   Financial Resource Strain:   . Difficulty of Paying Living Expenses: Not on file  Food Insecurity:   . Worried About Charity fundraiser in the Last Year: Not on file  . Ran Out of Food in the Last Year: Not on file  Transportation Needs:   . Lack of Transportation (Medical): Not on file  . Lack of Transportation (Non-Medical): Not on file  Physical Activity:   . Days of Exercise per Week: Not on file  . Minutes of Exercise per Session: Not on file  Stress:   . Feeling of Stress : Not on file  Social Connections:   . Frequency of Communication with Friends and Family: Not on file  . Frequency of Social Gatherings with Friends and Family: Not on file  .  Attends Religious Services: Not on file  . Active Member of Clubs or Organizations: Not on file  . Attends Archivist Meetings: Not on file  . Marital Status: Not on file   Past Surgical History:  Procedure Laterality Date  . APPENDECTOMY    . EYE SURGERY     cataract removed 05/27/2016 right eye   . INCISION AND DRAINAGE PERIRECTAL ABSCESS  07/28/2011   Procedure: IRRIGATION AND DEBRIDEMENT PERIRECTAL ABSCESS;  Surgeon: Adin Hector, MD;  Location: [facility];  Service: General;  Laterality: N/A;   of skin muscle and subcutaneous tissue of perimeum  8cmx12cm area   . IR GENERIC HISTORICAL  01/08/2016   IR RADIOLOGIST EVAL & MGMT 01/08/2016 [facility] INTERV RAD  . LAPAROSCOPIC APPENDECTOMY N/A 12/16/2015   Procedure: APPENDECTOMY LAPAROSCOPIC;  Surgeon: Coralie Keens, MD;  Location: [facility];  Service: General;   Laterality: N/A;  . PENECTOMY     Past Medical History:  Diagnosis Date  . Blind right eye   . Diabetes mellitus    Takes Metformin  . History of appendicitis 11/2015  . Hypertension   . Left flank pain 12/2019  . Priapism   . Stroke ([place]) 04/2015  . Tobacco use   . Vision abnormalities   . Vitamin D deficiency 12/2019   BP (!) 175/96 Comment: has not had bp med today  Pulse 84   Temp 98.5 F (36.9 C)   Ht 5\' 10"  (1.778 m)   Wt 182 lb (82.6 kg)   SpO2 99%   BMI 26.11 kg/m   Opioid Risk Score:   Fall Risk Score:  `1  Depression screen PHQ 2/9  Depression screen [facility] 2/9 02/28/2020 01/22/2020 09/20/2019 05/09/2018 11/19/2017 09/30/2017 07/23/2017  Decreased Interest 0 0 3 0 0 0 0  Down, Depressed, Hopeless 0 0 3 0 0 0 0  PHQ - 2 Score 0 0 6 0 0 0 0  Altered sleeping 0 - 3 - - - -  Tired, decreased energy 0 - 3 - - - -  Change in appetite 0 - 3 - - - -  Feeling bad or failure about yourself  0 - 3 - - - -  Trouble concentrating 0 - 3 - - - -  Moving slowly or fidgety/restless 0 - 1 - - - -  Suicidal thoughts 0 - 3 - - - -  PHQ-9 Score 0 - 25 - - - -    Review of Systems  Constitutional: Negative.   HENT: Negative.   Respiratory: Negative.   Cardiovascular: Negative.   Gastrointestinal: Negative.   Endocrine: Negative.   Genitourinary: Negative.   Musculoskeletal: Positive for gait problem.       Left flank pain/spasms  Skin: Negative.   Allergic/Immunologic: Negative.   Neurological: Positive for dizziness, weakness and numbness.       Hx stroke.  tingling  Hematological: Negative.   Psychiatric/Behavioral: Negative.   All other systems reviewed and are negative.      Objective:   Physical Exam Gen: no distress, normal appearing HEENT: oral mucosa pink and moist, right eye blindness Cardio: Reg rate Chest: normal effort, normal rate of breathing Abd: soft, non-distended Ext: no edema Skin: intact Neuro: Alert and oriented x3 Musculoskeletal: Left-side  is where he points to the pain, but it is not tender to palpation Psych: pleasant, normal affect    Assessment & Plan:  Mr. Culp is a 53 year year old man who presents with left sided rib  pain.  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  -Discussed current symptoms of pain and history of pain.  -Discussed benefits of exercise in reducing pain. -Discussed following foods that may reduce pain: 1) Ginger 2) Blueberries 3) Salmon 4) Pumpkin seeds 5) dark chocolate 6) turmeric 7) tart cherries 8) virgin olive oil 9) chilli peppers 10) mint 11) red wine -XR ribs and CT abdomen reviewed with patient- shows no rib abnormalities, does show gastroenteritis.  -MRI abdomen ordered and I will call him with results -Meloxicam 15mg  ordered. Probiotic and prebiotic foods recommended with this medication. Advised regarding side effects.   2) Vitamin D deficiency -level is 13.8- he is on appropriate supplement.  -Repeat level next visit

## 2020-02-28 NOTE — Patient Instructions (Signed)
Foods that reduce pain:  1) Ginger 2) Blueberries 3) Salmon 4) Pumpkin seeds 5) dark chocolate 6) turmeric 7) tart cherries 8) virgin olive oil 9) chilli peppers 10) mint 11) red wine

## 2020-02-29 MED FILL — VIT D2 1.25 MG (50,000 UNIT: 1.25 MG | 35 days supply | Qty: 5 | Fill #1

## 2020-02-29 MED FILL — LANTUS 100 UNITS/ML VIAL: 100 | 28 days supply | Qty: 10 | Fill #2

## 2020-02-29 NOTE — Telephone Encounter (Signed)
Pt was called and he stated that his MRI is scheduled for 03/04/2020. @ [place]. And there was nothing else I could help him with.

## 2020-03-09 ENCOUNTER — Ambulatory Visit (HOSPITAL_COMMUNITY): Payer: Medicaid Other | Attending: Physical Medicine and Rehabilitation

## 2020-04-10 ENCOUNTER — Encounter
Payer: Medicaid Other | Attending: Physical Medicine and Rehabilitation | Admitting: Physical Medicine and Rehabilitation

## 2020-04-10 DIAGNOSIS — Z8673 Personal history of transient ischemic attack (TIA), and cerebral infarction without residual deficits: Secondary | ICD-10-CM | POA: Insufficient documentation

## 2020-04-10 DIAGNOSIS — E559 Vitamin D deficiency, unspecified: Secondary | ICD-10-CM | POA: Insufficient documentation

## 2020-04-10 DIAGNOSIS — R109 Unspecified abdominal pain: Secondary | ICD-10-CM | POA: Insufficient documentation

## 2020-05-01 MED FILL — VIT D2 1.25 MG (50,000 UNIT: 1.25 MG | 35 days supply | Qty: 5 | Fill #2

## 2020-07-22 ENCOUNTER — Ambulatory Visit: Payer: Medicaid Other | Admitting: Family Medicine

## 2020-08-22 ENCOUNTER — Other Ambulatory Visit: Payer: Self-pay

## 2020-08-22 MED FILL — Ergocalciferol Cap 1.25 MG (50000 Unit): ORAL | 28 days supply | Qty: 4 | Fill #0 | Status: CN

## 2020-08-22 MED FILL — Insulin Glargine Inj 100 Unit/ML: SUBCUTANEOUS | 28 days supply | Qty: 10 | Fill #0 | Status: CN

## 2020-08-29 ENCOUNTER — Other Ambulatory Visit: Payer: Self-pay

## 2020-09-05 ENCOUNTER — Other Ambulatory Visit: Payer: Self-pay

## 2020-09-05 MED ORDER — IBUPROFEN 800 MG PO TABS
ORAL_TABLET | ORAL | 0 refills | Status: DC
Start: 2020-09-05 — End: 2020-10-02
  Filled 2020-09-05: qty 30, 10d supply, fill #0

## 2020-09-05 MED ORDER — PENICILLIN V POTASSIUM 500 MG PO TABS
ORAL_TABLET | ORAL | 0 refills | Status: DC
Start: 2020-09-05 — End: 2020-10-16
  Filled 2020-09-05: qty 40, 10d supply, fill #0

## 2020-09-06 ENCOUNTER — Other Ambulatory Visit: Payer: Self-pay

## 2020-09-10 ENCOUNTER — Telehealth: Payer: Self-pay

## 2020-09-10 NOTE — Telephone Encounter (Signed)
Pt said that he needs a referral for Dentist   Dr. Iantha Fallen (Dentist [phone]

## 2020-09-10 NOTE — Telephone Encounter (Signed)
Called and spoke with patient to inform patient that he will need to seen before we can refer him a dentist, made pt an for 10/16/20. Pt stated that he having a lot pain , I told him follow up with urgent care for pain .

## 2020-10-02 ENCOUNTER — Other Ambulatory Visit: Payer: Self-pay | Admitting: Nurse Practitioner

## 2020-10-02 ENCOUNTER — Telehealth: Payer: Self-pay

## 2020-10-02 ENCOUNTER — Other Ambulatory Visit: Payer: Self-pay

## 2020-10-02 MED ORDER — IBUPROFEN 800 MG PO TABS
ORAL_TABLET | ORAL | 0 refills | Status: DC
  Filled 2020-10-02: qty 30, 10d supply, fill #0

## 2020-10-02 NOTE — Telephone Encounter (Signed)
Ibuprofen

## 2020-10-02 NOTE — Telephone Encounter (Signed)
sent 

## 2020-10-02 NOTE — Progress Notes (Signed)
   [facility] [address] , [place]  [address] Phone:  [phone]   Fax:  [phone] 

## 2020-10-04 ENCOUNTER — Other Ambulatory Visit: Payer: Self-pay

## 2020-10-16 ENCOUNTER — Encounter: Payer: Self-pay | Admitting: Nurse Practitioner

## 2020-10-16 ENCOUNTER — Other Ambulatory Visit: Payer: Self-pay

## 2020-10-16 ENCOUNTER — Ambulatory Visit (INDEPENDENT_AMBULATORY_CARE_PROVIDER_SITE_OTHER): Payer: Medicaid Other | Admitting: Nurse Practitioner

## 2020-10-16 VITALS — BP 181/83 | HR 67 | Temp 97.9°F | Ht 70.0 in | Wt 179.0 lb

## 2020-10-16 DIAGNOSIS — Z794 Long term (current) use of insulin: Secondary | ICD-10-CM | POA: Diagnosis not present

## 2020-10-16 DIAGNOSIS — E1165 Type 2 diabetes mellitus with hyperglycemia: Secondary | ICD-10-CM | POA: Diagnosis not present

## 2020-10-16 DIAGNOSIS — R8290 Unspecified abnormal findings in urine: Secondary | ICD-10-CM

## 2020-10-16 DIAGNOSIS — I10 Essential (primary) hypertension: Secondary | ICD-10-CM | POA: Diagnosis not present

## 2020-10-16 DIAGNOSIS — Z1159 Encounter for screening for other viral diseases: Secondary | ICD-10-CM

## 2020-10-16 DIAGNOSIS — E118 Type 2 diabetes mellitus with unspecified complications: Secondary | ICD-10-CM

## 2020-10-16 DIAGNOSIS — Z130 Encounter for screening for diseases of the blood and blood-forming organs and certain disorders involving the immune mechanism: Secondary | ICD-10-CM | POA: Diagnosis not present

## 2020-10-16 DIAGNOSIS — E782 Mixed hyperlipidemia: Secondary | ICD-10-CM

## 2020-10-16 DIAGNOSIS — IMO0002 Reserved for concepts with insufficient information to code with codable children: Secondary | ICD-10-CM

## 2020-10-16 LAB — POCT GLYCOSYLATED HEMOGLOBIN (HGB A1C)
HbA1c POC (<> result, manual entry): 10.4 % (ref 4.0–5.6)
HbA1c, POC (controlled diabetic range): 10.4 % — AB (ref 0.0–7.0)
HbA1c, POC (prediabetic range): 10.4 % — AB (ref 5.7–6.4)
Hemoglobin A1C: 10.4 % — AB (ref 4.0–5.6)

## 2020-10-16 LAB — POCT URINALYSIS DIPSTICK
Bilirubin, UA: NEGATIVE
Glucose, UA: POSITIVE — AB
Ketones, UA: NEGATIVE
Nitrite, UA: NEGATIVE
Protein, UA: POSITIVE — AB
Spec Grav, UA: >=1.03 — AB (ref 1.010–1.025)
Urobilinogen, UA: 0.2 E.U./dL
pH, UA: 5 (ref 5.0–8.0)

## 2020-10-16 LAB — GLUCOSE, POCT (MANUAL RESULT ENTRY): POC Glucose: 292 mg/dl — AB (ref 70–99)

## 2020-10-16 MED ORDER — LISINOPRIL 20 MG PO TABS
20.0000 mg | ORAL_TABLET | Freq: Every day | ORAL | 3 refills | Status: DC
  Filled 2020-10-16 – 2021-06-18 (×2): qty 90, 90d supply, fill #0

## 2020-10-16 MED ORDER — INSULIN PEN NEEDLE 31G X 5 MM MISC
1.0000 "pen " | Freq: Every day | 11 refills | Status: AC
  Filled 2020-10-16 – 2020-11-12 (×2): qty 100, 34d supply, fill #0
  Filled 2020-11-14: qty 100, 30d supply, fill #0

## 2020-10-16 MED ORDER — ATORVASTATIN CALCIUM 40 MG PO TABS
40.0000 mg | ORAL_TABLET | Freq: Every day | ORAL | 3 refills | Status: DC
  Filled 2020-10-16 – 2021-06-18 (×2): qty 90, 90d supply, fill #0

## 2020-10-16 MED ORDER — INSULIN GLARGINE 100 UNIT/ML SOLOSTAR PEN
40.0000 [IU] | PEN_INJECTOR | Freq: Every day | SUBCUTANEOUS | 3 refills | Status: DC
  Filled 2020-10-16: qty 36, 90d supply, fill #0

## 2020-10-16 MED ORDER — AMLODIPINE BESYLATE 10 MG PO TABS
10.0000 mg | ORAL_TABLET | Freq: Every day | ORAL | 3 refills | Status: DC
  Filled 2020-10-16 – 2021-06-18 (×2): qty 90, 90d supply, fill #0

## 2020-10-16 MED ORDER — ACCU-CHEK GUIDE W/DEVICE KIT
1.0000 | PACK | Freq: Four times a day (QID) | 0 refills | Status: DC
  Filled 2020-10-16: qty 1, 30d supply, fill #0

## 2020-10-16 NOTE — Patient Instructions (Signed)
Diabetes Mellitus and Nutrition, Adult When you have diabetes, or diabetes mellitus, it is very important to have healthy eating habits because your blood sugar (glucose) levels are greatly affected by what you eat and drink. Eating healthy foods in the right amounts, at about the same times every day, can help you:  Control your blood glucose.  Lower your risk of heart disease.  Improve your blood pressure.  Reach or maintain a healthy weight. What can affect my meal plan? Every person with diabetes is different, and each person has different needs for a meal plan. Your health care provider may recommend that you work with a dietitian to make a meal plan that is best for you. Your meal plan may vary depending on factors such as:  The calories you need.  The medicines you take.  Your weight.  Your blood glucose, blood pressure, and cholesterol levels.  Your activity level.  Other health conditions you have, such as heart or kidney disease. How do carbohydrates affect me? Carbohydrates, also called carbs, affect your blood glucose level more than any other type of food. Eating carbs naturally raises the amount of glucose in your blood. Carb counting is a method for keeping track of how many carbs you eat. Counting carbs is important to keep your blood glucose at a healthy level, especially if you use insulin or take certain oral diabetes medicines. It is important to know how many carbs you can safely have in each meal. This is different for every person. Your dietitian can help you calculate how many carbs you should have at each meal and for each snack. How does alcohol affect me? Alcohol can cause a sudden decrease in blood glucose (hypoglycemia), especially if you use insulin or take certain oral diabetes medicines. Hypoglycemia can be a life-threatening condition. Symptoms of hypoglycemia, such as sleepiness, dizziness, and confusion, are similar to symptoms of having too much  alcohol.  Do not drink alcohol if: ? Your health care provider tells you not to drink. ? You are pregnant, may be pregnant, or are planning to become pregnant.  If you drink alcohol: ? Do not drink on an empty stomach. ? Limit how much you use to:  0-1 drink a day for women.  0-2 drinks a day for men. ? Be aware of how much alcohol is in your drink. In the [place] one drink equals one 12 oz bottle of beer (355 mL), one 5 oz glass of wine (148 mL), or one 1 oz glass of hard liquor (44 mL). ? Keep yourself hydrated with water, diet soda, or unsweetened iced tea.  Keep in mind that regular soda, juice, and other mixers may contain a lot of sugar and must be counted as carbs. What are tips for following this plan? Reading food labels  Start by checking the serving size on the "Nutrition Facts" label of packaged foods and drinks. The amount of calories, carbs, fats, and other nutrients listed on the label is based on one serving of the item. Many items contain more than one serving per package.  Check the total grams (g) of carbs in one serving. You can calculate the number of servings of carbs in one serving by dividing the total carbs by 15. For example, if a food has 30 g of total carbs per serving, it would be equal to 2 servings of carbs.  Check the number of grams (g) of saturated fats and trans fats in one serving. Choose foods that have   a low amount or none of these fats.  Check the number of milligrams (mg) of salt (sodium) in one serving. Most people should limit total sodium intake to less than 2,300 mg per day.  Always check the nutrition information of foods labeled as "low-fat" or "nonfat." These foods may be higher in added sugar or refined carbs and should be avoided.  Talk to your dietitian to identify your daily goals for nutrients listed on the label. Shopping  Avoid buying canned, pre-made, or processed foods. These foods tend to be high in fat, sodium, and added  sugar.  Shop around the outside edge of the grocery store. This is where you will most often find fresh fruits and vegetables, bulk grains, fresh meats, and fresh dairy. Cooking  Use low-heat cooking methods, such as baking, instead of high-heat cooking methods like deep frying.  Cook using healthy oils, such as olive, canola, or sunflower oil.  Avoid cooking with butter, cream, or high-fat meats. Meal planning  Eat meals and snacks regularly, preferably at the same times every day. Avoid going long periods of time without eating.  Eat foods that are high in fiber, such as fresh fruits, vegetables, beans, and whole grains. Talk with your dietitian about how many servings of carbs you can eat at each meal.  Eat 4-6 oz (112-168 g) of lean protein each day, such as lean meat, chicken, fish, eggs, or tofu. One ounce (oz) of lean protein is equal to: ? 1 oz (28 g) of meat, chicken, or fish. ? 1 egg. ?  cup (62 g) of tofu.  Eat some foods each day that contain healthy fats, such as avocado, nuts, seeds, and fish.   What foods should I eat? Fruits Berries. Apples. Oranges. Peaches. Apricots. Plums. Grapes. Mango. Papaya. Pomegranate. Kiwi. Cherries. Vegetables Lettuce. Spinach. Leafy greens, including kale, chard, collard greens, and mustard greens. Beets. Cauliflower. Cabbage. Broccoli. Carrots. Green beans. Tomatoes. Peppers. Onions. Cucumbers. Brussels sprouts. Grains Whole grains, such as whole-wheat or whole-grain bread, crackers, tortillas, cereal, and pasta. Unsweetened oatmeal. Quinoa. Brown or wild rice. Meats and other proteins Seafood. Poultry without skin. Lean cuts of poultry and beef. Tofu. Nuts. Seeds. Dairy Low-fat or fat-free dairy products such as milk, yogurt, and cheese. The items listed above may not be a complete list of foods and beverages you can eat. Contact a dietitian for more information. What foods should I avoid? Fruits Fruits canned with  syrup. Vegetables Canned vegetables. Frozen vegetables with butter or cream sauce. Grains Refined white flour and flour products such as bread, pasta, snack foods, and cereals. Avoid all processed foods. Meats and other proteins Fatty cuts of meat. Poultry with skin. Breaded or fried meats. Processed meat. Avoid saturated fats. Dairy Full-fat yogurt, cheese, or milk. Beverages Sweetened drinks, such as soda or iced tea. The items listed above may not be a complete list of foods and beverages you should avoid. Contact a dietitian for more information. Questions to ask a health care provider  Do I need to meet with a diabetes educator?  Do I need to meet with a dietitian?  What number can I call if I have questions?  When are the best times to check my blood glucose? Where to find more information:  American Diabetes Association: diabetes.org  Academy of Nutrition and Dietetics: www.eatright.org  National Institute of Diabetes and Digestive and Kidney Diseases: www.niddk.nih.gov  Association of Diabetes Care and Education Specialists: www.diabeteseducator.org Summary  It is important to have healthy eating   habits because your blood sugar (glucose) levels are greatly affected by what you eat and drink.  A healthy meal plan will help you control your blood glucose and maintain a healthy lifestyle.  Your health care provider may recommend that you work with a dietitian to make a meal plan that is best for you.  Keep in mind that carbohydrates (carbs) and alcohol have immediate effects on your blood glucose levels. It is important to count carbs and to use alcohol carefully. This information is not intended to replace advice given to you by your health care provider. Make sure you discuss any questions you have with your health care provider. Document Revised: 03/21/2019 Document Reviewed: 03/21/2019 Elsevier Patient Education  2021 Elsevier Inc.  

## 2020-10-16 NOTE — Progress Notes (Signed)
[place], [place]  [address] Phone:  [phone]   Fax:  [phone]   Established Patient Office Visit  Subjective:  Patient ID: Frank Schneider, male    DOB: 07/22/1975  Age: 45 y.o. MRN: [id number]  CC:  Chief Complaint  Patient presents with   Follow-up    Tooth ache, needing tooth pulled was told by dentist he needed to follow up here since it has been over 6 months.  Needing lisinopril, lantus, amlodipine and ibuprofen refilled.     HPI Ruston Fedora presents for follow up. He  has a past medical history of Blind right eye, Diabetes mellitus, History of appendicitis (11/2015), Hypertension, Left flank pain (12/2019), Priapism, Stroke ([place]) (04/2015), Tobacco use, Vision abnormalities, and Vitamin D deficiency (12/2019).    He is in today of refills of his medication.  He has been without his medications for at least 1 week due to lack of resources.He lives alone. He gets SSI. He does get food stamps but this is not enough. He reports that eating healthy is expensive.  He reports about a program but he has forgotten the name of the program.  Denies headache, dizziness, visual changes, shortness of breath, dyspnea on exertion, chest pain, nausea, vomiting or any edema.    Past Medical History:  Diagnosis Date   Blind right eye    Diabetes mellitus    Takes Metformin   History of appendicitis 11/2015   Hypertension    Left flank pain 12/2019   Priapism    Stroke ([place]) 04/2015   Tobacco use    Vision abnormalities    Vitamin D deficiency 12/2019    Past Surgical History:  Procedure Laterality Date   APPENDECTOMY     EYE SURGERY     cataract removed 05/27/2016 right eye    INCISION AND DRAINAGE PERIRECTAL ABSCESS  07/28/2011   Procedure: IRRIGATION AND DEBRIDEMENT PERIRECTAL ABSCESS;  Surgeon: Adin Hector, MD;  Location: [facility];  Service: General;  Laterality: N/A;   of skin muscle and subcutaneous tissue of  perimeum  8cmx12cm area    IR GENERIC HISTORICAL  01/08/2016   IR RADIOLOGIST EVAL & MGMT 01/08/2016 [facility] INTERV RAD   LAPAROSCOPIC APPENDECTOMY N/A 12/16/2015   Procedure: APPENDECTOMY LAPAROSCOPIC;  Surgeon: Coralie Keens, MD;  Location: [facility];  Service: General;  Laterality: N/A;   PENECTOMY      Family History  Problem Relation Age of Onset   Diabetes Mother    Hypertension Mother    Diabetes Maternal Grandmother    Hypertension Maternal Grandmother    Depression Maternal Grandmother     Social History   Socioeconomic History   Marital status: Single    Spouse name: Not on file   Number of children: Not on file   Years of education: Not on file   Highest education level: Not on file  Occupational History   Not on file  Tobacco Use   Smoking status: Some Days    Packs/day: 0.25    Years: 8.00    Pack years: 2.00    Types: Cigarettes   Smokeless tobacco: Never  Vaping Use   Vaping Use: Never used  Substance and Sexual Activity   Alcohol use: No   Drug use: Yes    Types: Marijuana    Comment: 3x wk   Sexual activity: Yes  Other Topics Concern   Not on file  Social History Narrative   Not  on file   Social Determinants of Health   Financial Resource Strain: Not on file  Food Insecurity: Not on file  Transportation Needs: Not on file  Physical Activity: Not on file  Stress: Not on file  Social Connections: Not on file  Intimate Partner Violence: Not on file    Outpatient Medications Prior to Visit  Medication Sig Dispense Refill   aspirin 325 MG tablet Take 1 tablet (325 mg total) by mouth daily. 30 tablet 0   ferrous sulfate 325 (65 FE) MG tablet Take 1 tablet (325 mg total) daily with breakfast by mouth. 30 tablet 3   glucose blood (TRUE METRIX BLOOD GLUCOSE TEST) test strip Use as instructed 100 each 12   Insulin Syringe-Needle U-100 (TRUEPLUS INSULIN SYRINGE) 31G X 5/16" 1 ML MISC USE AS DIRECTED. 100 each 3   Lancets (FREESTYLE) lancets Use as  instructed 100 each 12   Vitamin D, Ergocalciferol, (DRISDOL) 1.25 MG (50000 UNIT) CAPS capsule TAKE 1 CAPSULE (50,000 UNITS TOTAL) BY MOUTH EVERY 7 (SEVEN) DAYS. 5 capsule 3   amLODipine (NORVASC) 10 MG tablet Take 1 tablet (10 mg total) by mouth daily. 30 tablet 11   Blood Glucose Monitoring Suppl (TRUE METRIX AIR GLUCOSE METER) DEVI 1 each by Does not apply route 2 (two) times daily at 10 AM and 5 PM. 1 Device 0   lisinopril (ZESTRIL) 20 MG tablet Take 1 tablet (20 mg total) by mouth daily. 30 tablet 11   acetaminophen (TYLENOL) 500 MG tablet Take 2 tablets (Total= 1,000 mg), by mouth, 2 times a day as needed. (Patient not taking: Reported on 10/16/2020) 120 tablet 3   ibuprofen (ADVIL) 800 MG tablet take 1 tablet (800 mg) by mouth every 8 hours as needed for pain with food 30 tablet 0   insulin glargine (LANTUS) 100 UNIT/ML injection INJECT 30 UNITS INTO THE SKIN AT BEDTIME. 10 mL 11   lidocaine (LIDODERM) 5 % Place 1 patch onto the skin daily. Remove & Discard patch within 12 hours or as directed by MD (Patient not taking: Reported on 10/16/2020) 30 patch 0   atorvastatin (LIPITOR) 40 MG tablet Take 1 tablet (40 mg total) by mouth daily. 30 tablet 11   cyclobenzaprine (FLEXERIL) 10 MG tablet Take 1 tablet (10 mg total) by mouth 2 (two) times daily as needed for muscle spasms. 60 tablet 3   penicillin v potassium (VEETID) 500 MG tablet take 1 tablet (500 mg) by oral every 6 hours until gone 40 tablet 0   sulfamethoxazole-trimethoprim (BACTRIM DS) 800-160 MG tablet Take 1 tablet by mouth 2 (two) times daily. 14 tablet 0   No facility-administered medications prior to visit.    No Known Allergies  ROS Review of Systems    Objective:    Physical Exam Constitutional:      General: He is not in acute distress. HENT:     Head: Normocephalic and atraumatic.     Nose: Nose normal.     Mouth/Throat:     Mouth: Mucous membranes are moist.  Cardiovascular:     Rate and Rhythm: Normal rate  and regular rhythm.     Pulses: Normal pulses.     Heart sounds: Normal heart sounds.  Pulmonary:     Effort: Pulmonary effort is normal.     Breath sounds: Normal breath sounds.  Abdominal:     Palpations: Abdomen is soft.  Musculoskeletal:        General: Normal range of motion.  Cervical back: Normal range of motion.  Feet:     Right foot:     Protective Sensation: 10 sites tested.  10 sites sensed.     Skin integrity: Callus present.     Toenail Condition: Right toenails are abnormally thick.     Left foot:     Protective Sensation: 10 sites tested.  10 sites sensed.     Skin integrity: Callus present.     Toenail Condition: Left toenails are abnormally thick.  Skin:    General: Skin is warm and dry.     Capillary Refill: Capillary refill takes less than 2 seconds.  Neurological:     General: No focal deficit present.     Mental Status: He is alert and oriented to person, place, and time.    BP (!) 181/83 (BP Location: Right Arm, Patient Position: Sitting)   Pulse 67   Temp 97.9 F (36.6 C)   Ht 5' 10"  (1.778 m)   Wt 179 lb 0.6 oz (81.2 kg)   SpO2 97%   BMI 25.69 kg/m  Wt Readings from Last 3 Encounters:  10/16/20 179 lb 0.6 oz (81.2 kg)  02/28/20 182 lb (82.6 kg)  01/22/20 177 lb 9.6 oz (80.6 kg)     Health Maintenance Due  Topic Date Due   COVID-19 Vaccine (2 - Pfizer series) 01/24/2020    There are no preventive care reminders to display for this patient.  Lab Results  Component Value Date   TSH 1.450 01/22/2020   Lab Results  Component Value Date   WBC 11.5 (H) 01/21/2020   HGB 12.8 (L) 01/21/2020   HCT 40.9 01/21/2020   MCV 63.7 (L) 01/21/2020   PLT 176 01/21/2020   Lab Results  Component Value Date   NA 141 01/21/2020   K 4.0 01/21/2020   CO2 25 01/21/2020   GLUCOSE 69 (L) 01/21/2020   BUN 12 01/21/2020   CREATININE 0.85 01/21/2020   BILITOT 0.6 01/21/2020   ALKPHOS 58 01/21/2020   AST 23 01/21/2020   ALT 20 01/21/2020   PROT 8.1  01/21/2020   ALBUMIN 5.0 01/21/2020   CALCIUM 9.3 01/21/2020   ANIONGAP 15 01/21/2020   Lab Results  Component Value Date   CHOL 109 01/22/2020   Lab Results  Component Value Date   HDL 38 (L) 01/22/2020   Lab Results  Component Value Date   LDLCALC 54 01/22/2020   Lab Results  Component Value Date   TRIG 88 01/22/2020   Lab Results  Component Value Date   CHOLHDL 2.9 01/22/2020   Lab Results  Component Value Date   HGBA1C 10.4 (A) 10/16/2020   HGBA1C 10.4 10/16/2020   HGBA1C 10.4 (A) 10/16/2020   HGBA1C 10.4 (A) 10/16/2020      Assessment & Plan:   Problem List Items Addressed This Visit       Cardiovascular and Mediastinum   Essential hypertension - Primary Uncontrolled due to being off of medicine related to inability to afford Refill sent with the co-pay waiver request   Relevant Medications   lisinopril (ZESTRIL) 20 MG tablet   atorvastatin (LIPITOR) 40 MG tablet   amLODipine (NORVASC) 10 MG tablet   Other Relevant Orders   Urinalysis Dipstick (Completed)     Other   Hyperlipidemia   Relevant Medications   lisinopril (ZESTRIL) 20 MG tablet   atorvastatin (LIPITOR) 40 MG tablet   amLODipine (NORVASC) 10 MG tablet   Other Relevant Orders   Lipid panel (Completed)  Other Visit Diagnoses     Uncontrolled type 2 diabetes mellitus with complication, with long-term current use of insulin (HCC)     Encourage compliance with current treatment regimen  Dose adjustment increased Lantus 40 units nightly Encourage regular CBG monitoring Encourage contacting office if excessive hyperglycemia and or hypoglycemia Lifestyle modification with healthy diet (fewer calories, more high fiber foods, whole grains and non-starchy vegetables, lower fat meat and fish, low-fat diary include healthy oils) regular exercise (physical activity) and weight loss Opthalmology exam  Nutritional consult recommended     Relevant Medications   lisinopril (ZESTRIL) 20 MG tablet    atorvastatin (LIPITOR) 40 MG tablet   insulin glargine (LANTUS) 100 UNIT/ML Solostar Pen   Screening for deficiency anemia       Relevant Orders   CBC with Differential/Platelet (Completed)   Iron, TIBC and Ferritin Panel (Completed)   Encounter for hepatitis C screening test for low risk patient       Relevant Orders   Hepatitis C antibody (Completed)   Abnormal urinalysis       Relevant Orders   Urine Culture       Meds ordered this encounter  Medications   Blood Glucose Monitoring Suppl (ACCU-CHEK GUIDE) w/Device KIT    Sig: Use in the morning, at noon, in the evening, and at bedtime.    Dispense:  1 kit    Refill:  0    Order Specific Question:   Supervising Provider    Answer:   Tresa Garter [[id number]]   lisinopril (ZESTRIL) 20 MG tablet    Sig: Take 1 tablet (20 mg total) by mouth daily.    Dispense:  90 tablet    Refill:  3    Order Specific Question:   Supervising Provider    Answer:   Tresa Garter [[id number]]   atorvastatin (LIPITOR) 40 MG tablet    Sig: Take 1 tablet (40 mg total) by mouth daily.    Dispense:  90 tablet    Refill:  3    Order Specific Question:   Supervising Provider    Answer:   Tresa Garter [[id number]]   amLODipine (NORVASC) 10 MG tablet    Sig: Take 1 tablet (10 mg total) by mouth daily.    Dispense:  90 tablet    Refill:  3    Order Specific Question:   Supervising Provider    Answer:   Tresa Garter [[id number]]   insulin glargine (LANTUS) 100 UNIT/ML Solostar Pen    Sig: Inject 40 Units into the skin at bedtime.    Dispense:  36 mL    Refill:  3    Order Specific Question:   Supervising Provider    Answer:   Tresa Garter [[id number]]   Insulin Pen Needle 31G X 5 MM MISC    Sig: Use as directed    Dispense:  100 each    Refill:  11    Order Specific Question:   Supervising Provider    Answer:   Tresa Garter [[id number]]    Follow-up: Return in about 6 weeks (around 11/27/2020).    Vevelyn Francois, NP

## 2020-10-17 ENCOUNTER — Other Ambulatory Visit: Payer: Self-pay

## 2020-10-17 LAB — LIPID PANEL
Chol/HDL Ratio: 4.7 ratio (ref 0.0–5.0)
Cholesterol, Total: 141 mg/dL (ref 100–199)
HDL: 30 mg/dL — ABNORMAL LOW (ref >39)
LDL Chol Calc (NIH): 69 mg/dL (ref 0–99)
Triglycerides: 256 mg/dL — ABNORMAL HIGH (ref 0–149)
VLDL Cholesterol Cal: 42 mg/dL — ABNORMAL HIGH (ref 5–40)

## 2020-10-17 LAB — CBC WITH DIFFERENTIAL/PLATELET
Basophils Absolute: 0 10*3/uL (ref 0.0–0.2)
Basos: 1 %
EOS (ABSOLUTE): 0.2 10*3/uL (ref 0.0–0.4)
Eos: 3 %
Hematocrit: 43.8 % (ref 37.5–51.0)
Hemoglobin: 12.8 g/dL — ABNORMAL LOW (ref 13.0–17.7)
Immature Grans (Abs): 0 10*3/uL (ref 0.0–0.1)
Immature Granulocytes: 0 %
Lymphocytes Absolute: 3 10*3/uL (ref 0.7–3.1)
Lymphs: 35 %
MCH: 19.1 pg — ABNORMAL LOW (ref 26.6–33.0)
MCHC: 29.2 g/dL — ABNORMAL LOW (ref 31.5–35.7)
MCV: 66 fL — ABNORMAL LOW (ref 79–97)
Monocytes Absolute: 0.5 10*3/uL (ref 0.1–0.9)
Monocytes: 6 %
Neutrophils Absolute: 4.6 10*3/uL (ref 1.4–7.0)
Neutrophils: 55 %
Platelets: 173 10*3/uL (ref 150–450)
RBC: 6.69 x10E6/uL — ABNORMAL HIGH (ref 4.14–5.80)
RDW: 18.1 % — ABNORMAL HIGH (ref 11.6–15.4)
WBC: 8.3 10*3/uL (ref 3.4–10.8)

## 2020-10-17 LAB — COMP. METABOLIC PANEL (12)
AST: 13 IU/L (ref 0–40)
Albumin/Globulin Ratio: 2.6 — ABNORMAL HIGH (ref 1.2–2.2)
Albumin: 4.7 g/dL (ref 4.0–5.0)
Alkaline Phosphatase: 74 IU/L (ref 44–121)
BUN/Creatinine Ratio: 15 (ref 9–20)
BUN: 14 mg/dL (ref 6–24)
Bilirubin Total: 0.3 mg/dL (ref 0.0–1.2)
Calcium: 9.2 mg/dL (ref 8.7–10.2)
Chloride: 101 mmol/L (ref 96–106)
Creatinine, Ser: 0.91 mg/dL (ref 0.76–1.27)
Globulin, Total: 1.8 g/dL (ref 1.5–4.5)
Glucose: 278 mg/dL — ABNORMAL HIGH (ref 65–99)
Potassium: 4.2 mmol/L (ref 3.5–5.2)
Sodium: 135 mmol/L (ref 134–144)
Total Protein: 6.5 g/dL (ref 6.0–8.5)
eGFR: 107 mL/min/{1.73_m2} (ref >59)

## 2020-10-17 LAB — HEPATITIS C ANTIBODY: Hep C Virus Ab: <0.1 s/co ratio (ref 0.0–0.9)

## 2020-10-17 LAB — IRON,TIBC AND FERRITIN PANEL
Ferritin: 337 ng/mL (ref 30–400)
Iron Saturation: 25 % (ref 15–55)
Iron: 67 ug/dL (ref 38–169)
Total Iron Binding Capacity: 265 ug/dL (ref 250–450)
UIBC: 198 ug/dL (ref 111–343)

## 2020-10-18 ENCOUNTER — Telehealth: Payer: Self-pay | Admitting: Clinical

## 2020-10-18 NOTE — Telephone Encounter (Signed)
[facility]ated [facility] Case Management Referral Note  10/18/2020 Name: Frank Schneider MRN: [id number] DOB: [birth year] Frank Schneider is a 45 y.o. year old male who sees Vevelyn Francois, NP for primary care. LCSW was consulted to assess patient's needs and assist the patient with Food Insecurity.  Interpreter: No.   Interpreter Name & Language: none  Assessment: Patient experiencing Limited access to food. Patient has food stamps benefits but often is not able to find or afford healthy foods.  Intervention: CSW called patient to follow up from patient's PCP visit 10/16/20. Patient received emergency food donation from Patient [facility]) food pantry while here. CSW provided additional healthy food resources to patient over the phone and email today, including [employer], [employer], and free [employer]. Both farmers markets accept and double EBT benefits. Patient does utilize the [employer] as well. Provided CSW contact information and advised patient to call with additional questions.   Review of patient status, including review of consultants reports, relevant laboratory and other test results, and collaboration with appropriate care team members and the patient's provider was performed as part of comprehensive patient evaluation and provision of services.    [name], [facility] [phone]

## 2020-10-19 LAB — URINE CULTURE

## 2020-10-24 ENCOUNTER — Other Ambulatory Visit: Payer: Self-pay

## 2020-11-12 ENCOUNTER — Other Ambulatory Visit: Payer: Self-pay

## 2020-11-13 ENCOUNTER — Other Ambulatory Visit: Payer: Self-pay

## 2020-11-13 MED ORDER — AMOXICILLIN 500 MG PO CAPS
ORAL_CAPSULE | ORAL | 0 refills | Status: DC
Start: 2020-11-13 — End: 2021-06-16
  Filled 2020-11-13: qty 21, 7d supply, fill #0

## 2020-11-13 MED ORDER — IBUPROFEN 800 MG PO TABS
800.0000 mg | ORAL_TABLET | Freq: Three times a day (TID) | ORAL | 0 refills | Status: DC | PRN
Start: 2020-11-13 — End: 2021-06-16
  Filled 2020-11-13: qty 15, 5d supply, fill #0

## 2020-11-14 ENCOUNTER — Other Ambulatory Visit: Payer: Self-pay

## 2020-11-19 ENCOUNTER — Other Ambulatory Visit: Payer: Self-pay

## 2020-11-29 ENCOUNTER — Ambulatory Visit: Payer: Medicaid Other | Admitting: Nurse Practitioner

## 2020-12-12 ENCOUNTER — Other Ambulatory Visit: Payer: Self-pay

## 2020-12-12 ENCOUNTER — Telehealth (INDEPENDENT_AMBULATORY_CARE_PROVIDER_SITE_OTHER): Payer: Medicaid Other | Admitting: Nurse Practitioner

## 2020-12-12 ENCOUNTER — Encounter: Payer: Self-pay | Admitting: Nurse Practitioner

## 2020-12-12 DIAGNOSIS — E1165 Type 2 diabetes mellitus with hyperglycemia: Secondary | ICD-10-CM | POA: Diagnosis not present

## 2020-12-12 DIAGNOSIS — E782 Mixed hyperlipidemia: Secondary | ICD-10-CM | POA: Diagnosis not present

## 2020-12-12 DIAGNOSIS — H5440 Blindness, one eye, unspecified eye: Secondary | ICD-10-CM

## 2020-12-12 DIAGNOSIS — F419 Anxiety disorder, unspecified: Secondary | ICD-10-CM | POA: Diagnosis not present

## 2020-12-12 DIAGNOSIS — F32A Depression, unspecified: Secondary | ICD-10-CM

## 2020-12-12 NOTE — Patient Instructions (Signed)
Diabetes Mellitus and Nutrition, Adult When you have diabetes, or diabetes mellitus, it is very important to have healthy eating habits because your blood sugar (glucose) levels are greatly affected by what you eat and drink. Eating healthy foods in the right amounts, at about the same times every day, can help you:  Control your blood glucose.  Lower your risk of heart disease.  Improve your blood pressure.  Reach or maintain a healthy weight. What can affect my meal plan? Every person with diabetes is different, and each person has different needs for a meal plan. Your health care provider may recommend that you work with a dietitian to make a meal plan that is best for you. Your meal plan may vary depending on factors such as:  The calories you need.  The medicines you take.  Your weight.  Your blood glucose, blood pressure, and cholesterol levels.  Your activity level.  Other health conditions you have, such as heart or kidney disease. How do carbohydrates affect me? Carbohydrates, also called carbs, affect your blood glucose level more than any other type of food. Eating carbs naturally raises the amount of glucose in your blood. Carb counting is a method for keeping track of how many carbs you eat. Counting carbs is important to keep your blood glucose at a healthy level, especially if you use insulin or take certain oral diabetes medicines. It is important to know how many carbs you can safely have in each meal. This is different for every person. Your dietitian can help you calculate how many carbs you should have at each meal and for each snack. How does alcohol affect me? Alcohol can cause a sudden decrease in blood glucose (hypoglycemia), especially if you use insulin or take certain oral diabetes medicines. Hypoglycemia can be a life-threatening condition. Symptoms of hypoglycemia, such as sleepiness, dizziness, and confusion, are similar to symptoms of having too much  alcohol.  Do not drink alcohol if: ? Your health care provider tells you not to drink. ? You are pregnant, may be pregnant, or are planning to become pregnant.  If you drink alcohol: ? Do not drink on an empty stomach. ? Limit how much you use to:  0-1 drink a day for women.  0-2 drinks a day for men. ? Be aware of how much alcohol is in your drink. In the [place] one drink equals one 12 oz bottle of beer (355 mL), one 5 oz glass of wine (148 mL), or one 1 oz glass of hard liquor (44 mL). ? Keep yourself hydrated with water, diet soda, or unsweetened iced tea.  Keep in mind that regular soda, juice, and other mixers may contain a lot of sugar and must be counted as carbs. What are tips for following this plan? Reading food labels  Start by checking the serving size on the "Nutrition Facts" label of packaged foods and drinks. The amount of calories, carbs, fats, and other nutrients listed on the label is based on one serving of the item. Many items contain more than one serving per package.  Check the total grams (g) of carbs in one serving. You can calculate the number of servings of carbs in one serving by dividing the total carbs by 15. For example, if a food has 30 g of total carbs per serving, it would be equal to 2 servings of carbs.  Check the number of grams (g) of saturated fats and trans fats in one serving. Choose foods that have   a low amount or none of these fats.  Check the number of milligrams (mg) of salt (sodium) in one serving. Most people should limit total sodium intake to less than 2,300 mg per day.  Always check the nutrition information of foods labeled as "low-fat" or "nonfat." These foods may be higher in added sugar or refined carbs and should be avoided.  Talk to your dietitian to identify your daily goals for nutrients listed on the label. Shopping  Avoid buying canned, pre-made, or processed foods. These foods tend to be high in fat, sodium, and added  sugar.  Shop around the outside edge of the grocery store. This is where you will most often find fresh fruits and vegetables, bulk grains, fresh meats, and fresh dairy. Cooking  Use low-heat cooking methods, such as baking, instead of high-heat cooking methods like deep frying.  Cook using healthy oils, such as olive, canola, or sunflower oil.  Avoid cooking with butter, cream, or high-fat meats. Meal planning  Eat meals and snacks regularly, preferably at the same times every day. Avoid going long periods of time without eating.  Eat foods that are high in fiber, such as fresh fruits, vegetables, beans, and whole grains. Talk with your dietitian about how many servings of carbs you can eat at each meal.  Eat 4-6 oz (112-168 g) of lean protein each day, such as lean meat, chicken, fish, eggs, or tofu. One ounce (oz) of lean protein is equal to: ? 1 oz (28 g) of meat, chicken, or fish. ? 1 egg. ?  cup (62 g) of tofu.  Eat some foods each day that contain healthy fats, such as avocado, nuts, seeds, and fish.   What foods should I eat? Fruits Berries. Apples. Oranges. Peaches. Apricots. Plums. Grapes. Mango. Papaya. Pomegranate. Kiwi. Cherries. Vegetables Lettuce. Spinach. Leafy greens, including kale, chard, collard greens, and mustard greens. Beets. Cauliflower. Cabbage. Broccoli. Carrots. Green beans. Tomatoes. Peppers. Onions. Cucumbers. Brussels sprouts. Grains Whole grains, such as whole-wheat or whole-grain bread, crackers, tortillas, cereal, and pasta. Unsweetened oatmeal. Quinoa. Brown or wild rice. Meats and other proteins Seafood. Poultry without skin. Lean cuts of poultry and beef. Tofu. Nuts. Seeds. Dairy Low-fat or fat-free dairy products such as milk, yogurt, and cheese. The items listed above may not be a complete list of foods and beverages you can eat. Contact a dietitian for more information. What foods should I avoid? Fruits Fruits canned with  syrup. Vegetables Canned vegetables. Frozen vegetables with butter or cream sauce. Grains Refined white flour and flour products such as bread, pasta, snack foods, and cereals. Avoid all processed foods. Meats and other proteins Fatty cuts of meat. Poultry with skin. Breaded or fried meats. Processed meat. Avoid saturated fats. Dairy Full-fat yogurt, cheese, or milk. Beverages Sweetened drinks, such as soda or iced tea. The items listed above may not be a complete list of foods and beverages you should avoid. Contact a dietitian for more information. Questions to ask a health care provider  Do I need to meet with a diabetes educator?  Do I need to meet with a dietitian?  What number can I call if I have questions?  When are the best times to check my blood glucose? Where to find more information:  American Diabetes Association: diabetes.org  Academy of Nutrition and Dietetics: www.eatright.org  National Institute of Diabetes and Digestive and Kidney Diseases: www.niddk.nih.gov  Association of Diabetes Care and Education Specialists: www.diabeteseducator.org Summary  It is important to have healthy eating   habits because your blood sugar (glucose) levels are greatly affected by what you eat and drink.  A healthy meal plan will help you control your blood glucose and maintain a healthy lifestyle.  Your health care provider may recommend that you work with a dietitian to make a meal plan that is best for you.  Keep in mind that carbohydrates (carbs) and alcohol have immediate effects on your blood glucose levels. It is important to count carbs and to use alcohol carefully. This information is not intended to replace advice given to you by your health care provider. Make sure you discuss any questions you have with your health care provider. Document Revised: 03/21/2019 Document Reviewed: 03/21/2019 Elsevier Patient Education  2021 Elsevier Inc.  

## 2020-12-12 NOTE — Progress Notes (Signed)
   [place], [place]  [address] Phone:  [phone]   Fax:  [phone] Virtual Visit via Telephone Note  I connected with Frank Schneider on 12/14/20 at  3:20 PM EDT by telephone and verified that I am speaking with the correct person using two identifiers.   I discussed the limitations, risks, security and privacy concerns of performing an evaluation and management service by telephone and the availability of in person appointments. I also discussed with the patient that there may be a patient responsible charge related to this service. The patient expressed understanding and agreed to proceed.  Patient home Provider Office  History of Present Illness:  Frank Schneider  has a past medical history of Blind right eye, Diabetes mellitus, History of appendicitis (11/2015), Hypertension, Left flank pain (12/2019), Priapism, Stroke ([place]) (04/2015), Tobacco use, Vision abnormalities, and Vitamin D deficiency (12/2019).   Diabetes Mellitus Patient presents for follow up of diabetes. Current symptoms include: hyperglycemia. Symptoms have stabilized. Patient denies foot ulcerations, increased appetite, nausea, paresthesia of the feet, polydipsia, polyuria, visual disturbances, and vomiting. Evaluation to date has included: hemoglobin A1C.  Home sugars: BGs consistently in an acceptable range. Current treatment: Continued insulin which has been unable to assess effectiveness, Continued statin which has been unable to assess effectiveness, and Continued ACE inhibitor/ARB which has been unable to assess effectiveness. He reports that he CBG is better. He is enjoying the pen    ROS   Observations/Objective: No exam; telephone visit  Assessment and Plan: 1. Type 2 diabetes mellitus with hyperglycemia, without long-term current use of insulin (HCC) Encourage compliance with current treatment regimen   Encourage regular CBG monitoring Encourage contacting  office if excessive hyperglycemia and or hypoglycemia Lifestyle modification with healthy diet (fewer calories, more high fiber foods, whole grains and non-starchy vegetables, lower fat meat and fish, low-fat diary include healthy oils) regular exercise (physical activity) and weight loss Opthalmology exam discussed  Nutritional consult recommended Regular dental visits encouraged Home BP monitoring also encouraged goal <130/80   2. Blindness, one eye  3. Mixed hyperlipidemia Healthy diet  4. Anxiety and depression Stable excited about his birthday and spending time with family  Follow Up Instructions:     I discussed the assessment and treatment plan with the patient. The patient was provided an opportunity to ask questions and all were answered. The patient agreed with the plan and demonstrated an understanding of the instructions.   The patient was advised to call back or seek an in-person evaluation if the symptoms worsen or if the condition fails to improve as anticipated.  I provided 8 minutes of telephone- visit time during this encounter.   Frank Francois, NP      .

## 2021-01-24 ENCOUNTER — Ambulatory Visit: Payer: Medicaid Other | Admitting: Nurse Practitioner

## 2021-02-03 ENCOUNTER — Ambulatory Visit: Payer: Medicaid Other | Admitting: Nurse Practitioner

## 2021-02-06 ENCOUNTER — Ambulatory Visit: Payer: Medicaid Other | Admitting: Nurse Practitioner

## 2021-04-02 IMAGING — US US RENAL
1 series · 14 of 25 positions shown · non-contrast
Comparison: CT abdomen pelvis 06/02/2018

CLINICAL DATA: Chronic left flank pain

EXAM:
RENAL / URINARY TRACT ULTRASOUND COMPLETE

[Series 1: us renal · 14 of 26 slices shown]
[im 1/26]
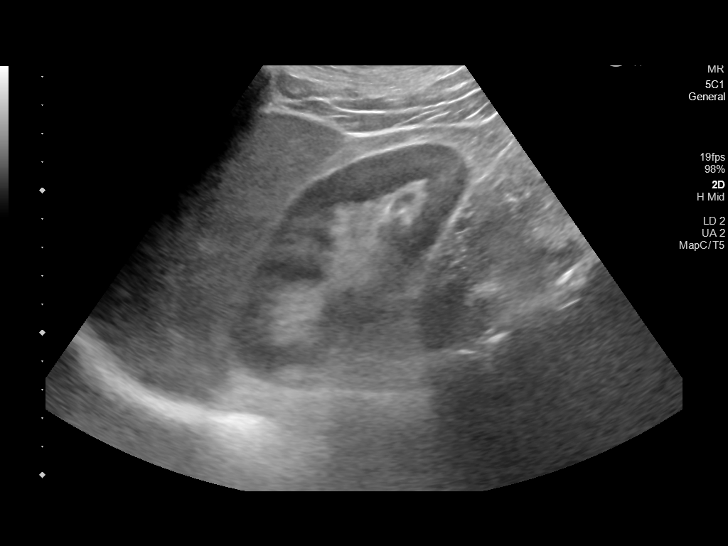
[im 3/26]
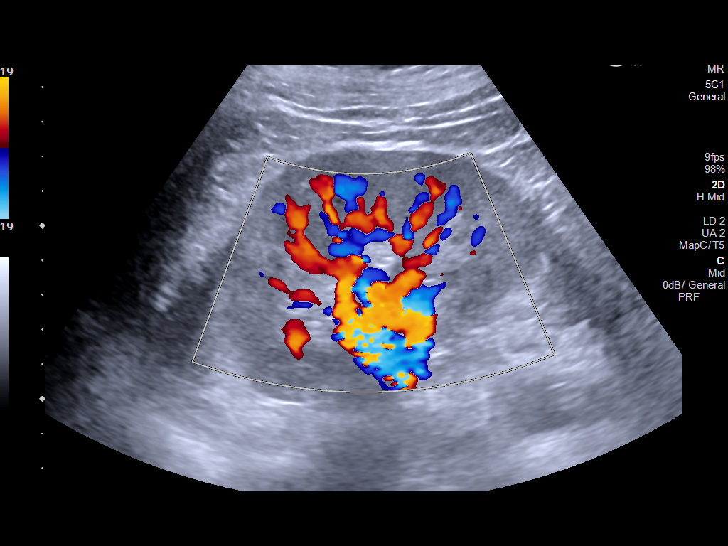
[im 5/26]
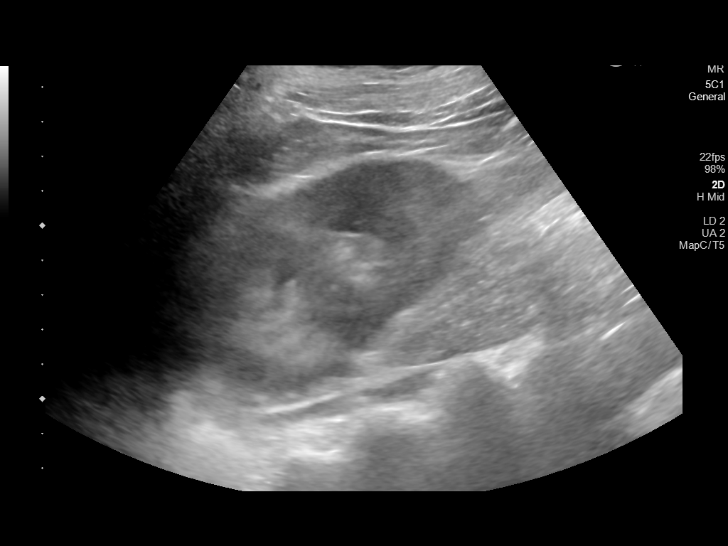
[im 7/26]
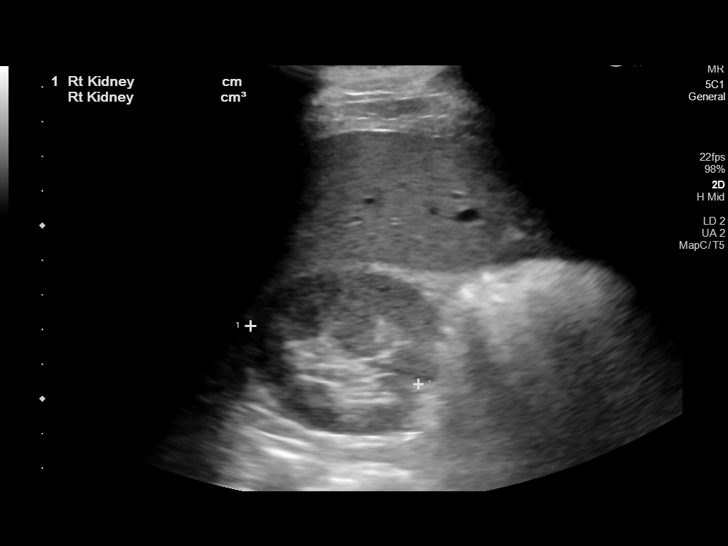
[im 9/26]
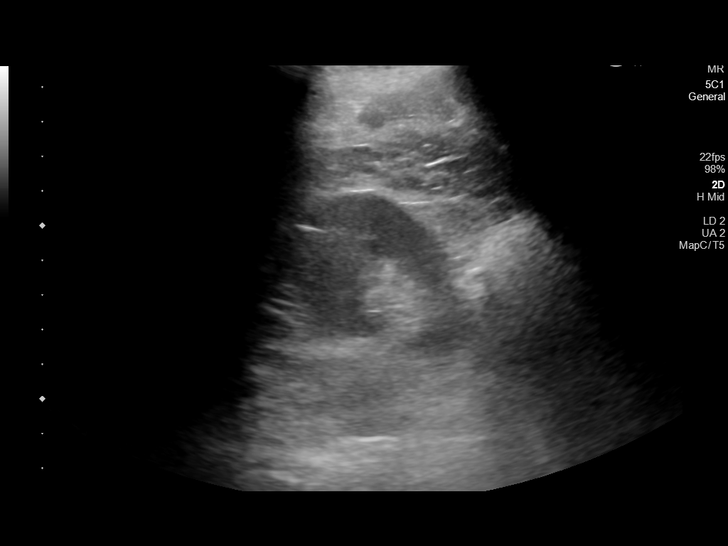
[im 10/26]
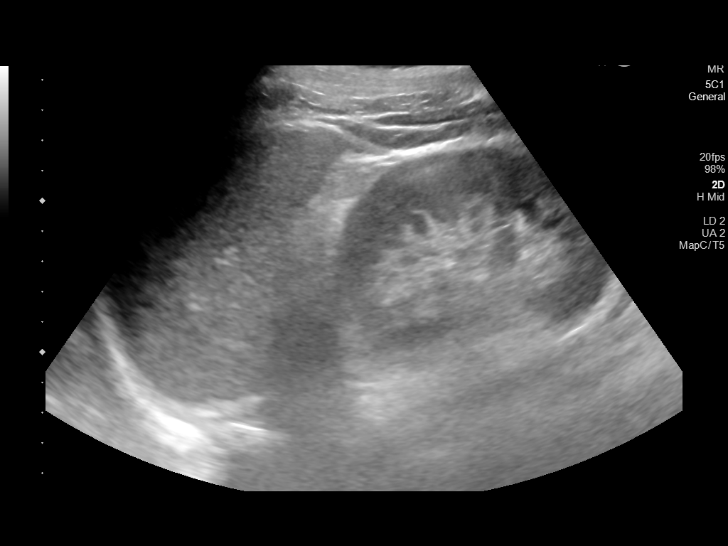
[im 12/26]
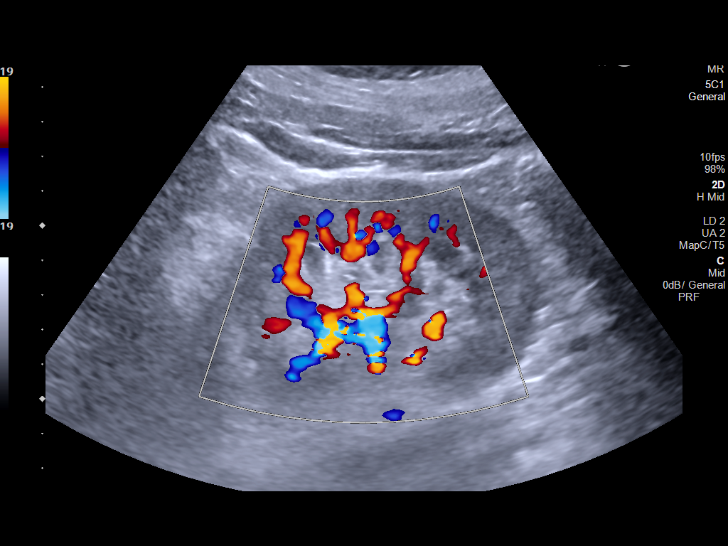
[im 14/26]
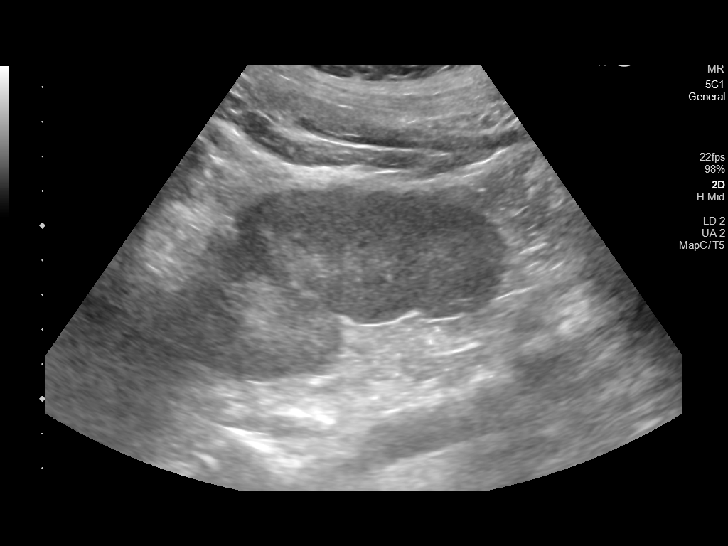
[im 16/26]
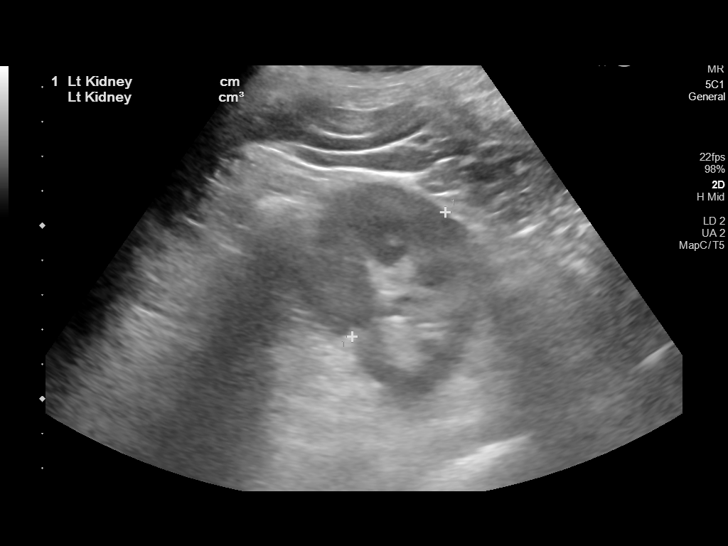
[im 17/26]
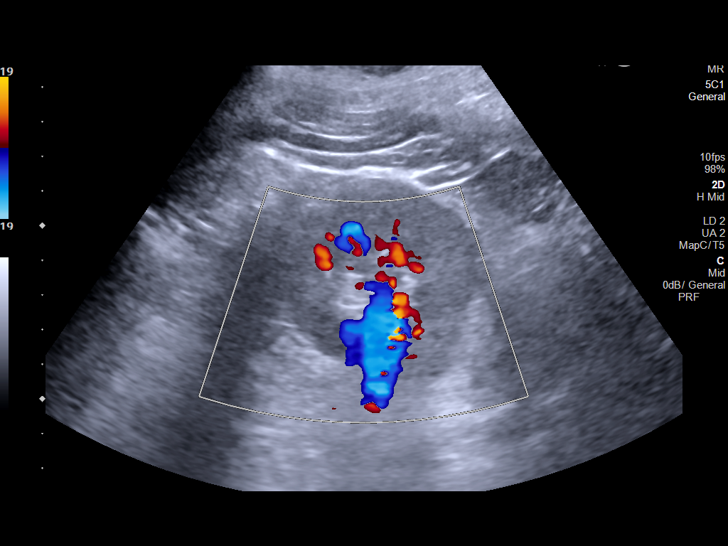
[im 19/26]
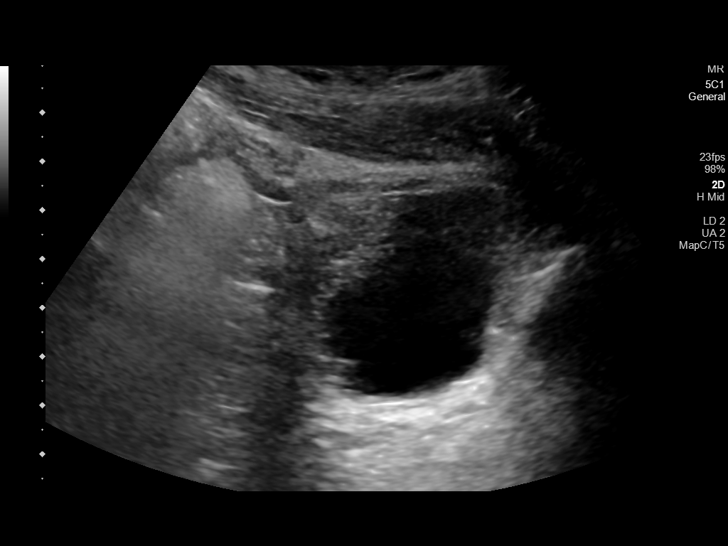
[im 21/26]
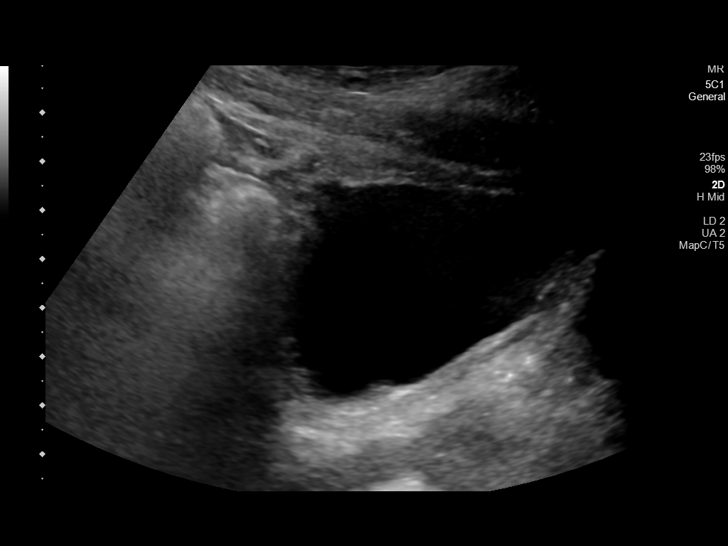
[im 23/26]
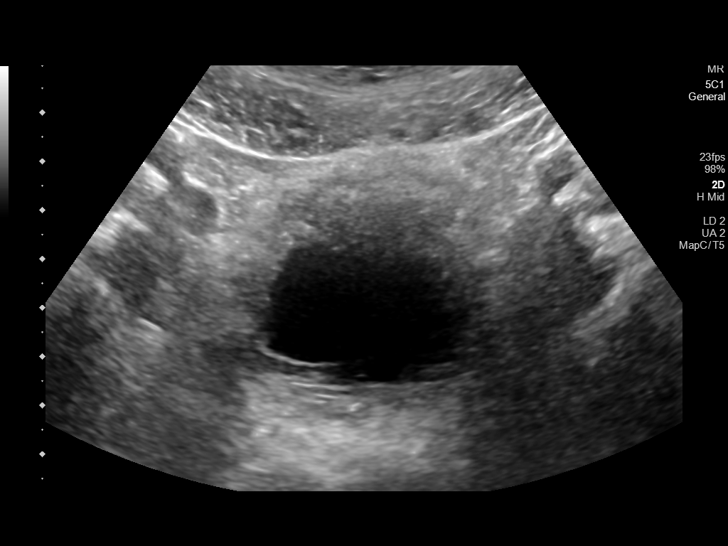
[im 26/26]
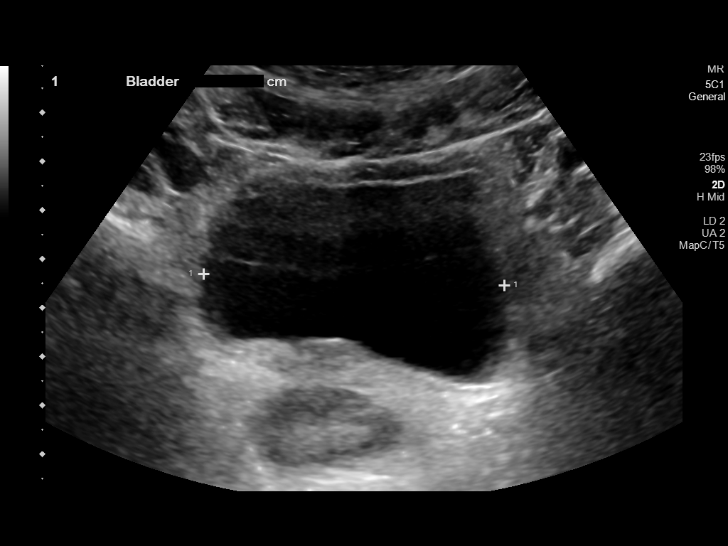

[14 of 25 positions shown; findings below may reference images not displayed]

FINDINGS: Right Kidney:

Renal measurements: 10.3 x 6.2 x 5.1 cm = volume: 173 mL.
Echogenicity is within normal limits. No concerning renal mass,
shadowing calculus or hydronephrosis.

Left Kidney:

Renal measurements: 9.4 x 6.3 x 4.5 cm = volume: 138 mL.
Echogenicity is within normal limits. No concerning renal mass,
shadowing calculus or hydronephrosis.

Bladder:

Only mildly distended at the time of examination with a prevoid
volume of 85 mL. Appearance of the bladder is unremarkable for the
degree of distention.

Other:

None.
IMPRESSION: Unremarkable urinary tract ultrasound.

## 2021-05-19 DIAGNOSIS — H5213 Myopia, bilateral: Secondary | ICD-10-CM | POA: Diagnosis not present

## 2021-06-05 ENCOUNTER — Telehealth: Payer: Self-pay | Admitting: Clinical

## 2021-06-05 NOTE — Telephone Encounter (Signed)
Integrated Behavioral Health General Follow Up Note  06/05/2021 Name: Frank Schneider MRN: [id number] DOB: [birth year] Frank Schneider is a 46 y.o. year old male who sees Vevelyn Francois, NP for primary care. LCSW was consulted to assess patient and assist with mental health concerns.  Interpreter: No.   Interpreter Name & Language: none  Assessment: Patient experiencing mental health concerns.  Ongoing Intervention: Today patient called Patient [facility]) and indicated he had thoughts of harming himself. Patient was transferred to [place] from front desk after making these statements to front desk rep.   CSW assessed; patient reported he is experiencing multiple challenges including difficulty getting his medications, not feeling like his medical concerns were addressed, and not feeling supported by his family. He said he feels like doctors wanted him to die. After sharing initial concerns, patient stated, "I don't want to kill myself" and shared that he just didn't feel like he was receiving any help.   CSW assisted patient in making an appointment with his PCP here at [facility] on 06/09/21. Patient has transportation barriers so also scheduled ride to this appointment with [employer]. Made plan to follow up with patient during this visit and schedule mental health counseling; patient is agreeable to counseling and states he does need someone to talk to.   Provided patient with mental health crisis phone numbers, including [facility]) [phone], [facility] [phone], and crisis text line [phone]. At the end of phone call, patient denied thoughts of suicide. He agrees to attend appointment with PCP this Monday.  Review of patient status, including review of consultants reports, relevant laboratory and other test results, and collaboration with appropriate care team members and  the patient's provider was performed as part of comprehensive patient evaluation and provision of services.    [name], [facility] [phone]

## 2021-06-09 ENCOUNTER — Ambulatory Visit: Payer: Medicaid Other | Admitting: Nurse Practitioner

## 2021-06-13 ENCOUNTER — Ambulatory Visit: Payer: Medicaid Other | Admitting: Nurse Practitioner

## 2021-06-16 ENCOUNTER — Other Ambulatory Visit (HOSPITAL_COMMUNITY): Payer: Self-pay

## 2021-06-16 ENCOUNTER — Encounter: Payer: Self-pay | Admitting: Nurse Practitioner

## 2021-06-16 ENCOUNTER — Ambulatory Visit (INDEPENDENT_AMBULATORY_CARE_PROVIDER_SITE_OTHER): Payer: Medicaid Other | Admitting: Clinical

## 2021-06-16 ENCOUNTER — Other Ambulatory Visit: Payer: Self-pay

## 2021-06-16 ENCOUNTER — Ambulatory Visit (INDEPENDENT_AMBULATORY_CARE_PROVIDER_SITE_OTHER): Payer: Medicaid Other | Admitting: Nurse Practitioner

## 2021-06-16 VITALS — BP 137/70 | HR 73 | Temp 97.9°F | Ht 70.0 in | Wt 185.8 lb

## 2021-06-16 DIAGNOSIS — H5440 Blindness, one eye, unspecified eye: Secondary | ICD-10-CM

## 2021-06-16 DIAGNOSIS — Z720 Tobacco use: Secondary | ICD-10-CM | POA: Diagnosis not present

## 2021-06-16 DIAGNOSIS — Z130 Encounter for screening for diseases of the blood and blood-forming organs and certain disorders involving the immune mechanism: Secondary | ICD-10-CM | POA: Diagnosis not present

## 2021-06-16 DIAGNOSIS — R82998 Other abnormal findings in urine: Secondary | ICD-10-CM | POA: Diagnosis not present

## 2021-06-16 DIAGNOSIS — E782 Mixed hyperlipidemia: Secondary | ICD-10-CM

## 2021-06-16 DIAGNOSIS — Z1329 Encounter for screening for other suspected endocrine disorder: Secondary | ICD-10-CM | POA: Diagnosis not present

## 2021-06-16 DIAGNOSIS — Z1211 Encounter for screening for malignant neoplasm of colon: Secondary | ICD-10-CM | POA: Diagnosis not present

## 2021-06-16 DIAGNOSIS — Z125 Encounter for screening for malignant neoplasm of prostate: Secondary | ICD-10-CM | POA: Diagnosis not present

## 2021-06-16 DIAGNOSIS — F419 Anxiety disorder, unspecified: Secondary | ICD-10-CM | POA: Diagnosis not present

## 2021-06-16 DIAGNOSIS — F32A Depression, unspecified: Secondary | ICD-10-CM | POA: Diagnosis not present

## 2021-06-16 DIAGNOSIS — R718 Other abnormality of red blood cells: Secondary | ICD-10-CM | POA: Diagnosis not present

## 2021-06-16 DIAGNOSIS — I639 Cerebral infarction, unspecified: Secondary | ICD-10-CM

## 2021-06-16 DIAGNOSIS — E1165 Type 2 diabetes mellitus with hyperglycemia: Secondary | ICD-10-CM

## 2021-06-16 LAB — POCT URINALYSIS DIP (CLINITEK)
Bilirubin, UA: NEGATIVE
Blood, UA: NEGATIVE
Glucose, UA: 250 mg/dL — AB
Ketones, POC UA: NEGATIVE mg/dL
Nitrite, UA: NEGATIVE
POC PROTEIN,UA: 30 — AB
Spec Grav, UA: >=1.03 — AB (ref 1.010–1.025)
Urobilinogen, UA: 0.2 E.U./dL
pH, UA: 5.5 (ref 5.0–8.0)

## 2021-06-16 LAB — POCT GLYCOSYLATED HEMOGLOBIN (HGB A1C)
HbA1c POC (<> result, manual entry): 9.6 % (ref 4.0–5.6)
HbA1c, POC (controlled diabetic range): 9.6 % — AB (ref 0.0–7.0)
HbA1c, POC (prediabetic range): 9.6 % — AB (ref 5.7–6.4)
Hemoglobin A1C: 9.6 % — AB (ref 4.0–5.6)

## 2021-06-16 MED ORDER — LANTUS SOLOSTAR 100 UNIT/ML ~~LOC~~ SOPN
30.0000 [IU] | PEN_INJECTOR | Freq: Every day | SUBCUTANEOUS | 0 refills | Status: DC
  Filled 2021-06-16: qty 9, 30d supply, fill #0
  Filled 2021-06-16 (×2): qty 15, 50d supply, fill #0

## 2021-06-16 MED ORDER — LANTUS SOLOSTAR 100 UNIT/ML ~~LOC~~ SOPN
30.0000 [IU] | PEN_INJECTOR | Freq: Every day | SUBCUTANEOUS | 99 refills | Status: DC
  Filled 2021-06-16: qty 15, 50d supply, fill #0

## 2021-06-16 NOTE — Progress Notes (Signed)
[place], [place]  [address] Phone:  [phone]   Fax:  [phone]   Established Patient Office Visit  Subjective:  Patient ID: Frank Schneider, male    DOB: [birth year]  Age: 46 y.o. MRN: [id number]  CC:  Chief Complaint  Patient presents with   Follow-up    Pt is here today for his follow up visit. Pt is needing refills on his medications and is requesting home health aide for help with house duties.    HPI Frank Schneider presents for follow up. He  has a past medical history of Blind right eye, Diabetes mellitus, History of appendicitis (11/2015), Hypertension, Left flank pain (12/2019), Priapism, Stroke ([place]) (04/2015), Tobacco use, Vision abnormalities, and Vitamin D deficiency (12/2019).   Frank Schneider is in today for diabetes follow up. The prescribed treatment is Lantus 30 units qhs along with statin therapy Atrovastain 40 mg The reported use of treatment is not consistent with prescribed. There denies reported side effects from the treatment. He has been having transportation issues. He has persistent tingling. Denies fever, chills, headache, dizziness, visual changes, polydipsia,  polyphagia shortness of breath, dyspnea on exertion, chest pain, abdominal pain, nausea, vomiting, polyuria, constipation, diarrhea,  any edema, numbness, burning of hand or feet.    He has been seen by CSW for depression and suicidual thoughts.  He has a history of stroke and has left eye blindness and left sided weakness. This makes ADL's a challenge. He is requesting assistance to improve his home life.  Past Medical History:  Diagnosis Date   Blind right eye    Diabetes mellitus    Takes Metformin   History of appendicitis 11/2015   Hypertension    Left flank pain 12/2019   Priapism    Stroke ([place]) 04/2015   Tobacco use    Vision abnormalities    Vitamin D deficiency 12/2019    Past Surgical History:  Procedure Laterality Date    APPENDECTOMY     EYE SURGERY     cataract removed 05/27/2016 right eye    INCISION AND DRAINAGE PERIRECTAL ABSCESS  07/28/2011   Procedure: IRRIGATION AND DEBRIDEMENT PERIRECTAL ABSCESS;  Surgeon: Adin Hector, MD;  Location: [facility];  Service: General;  Laterality: N/A;   of skin muscle and subcutaneous tissue of perimeum  8cmx12cm area    IR GENERIC HISTORICAL  01/08/2016   IR RADIOLOGIST EVAL & MGMT 01/08/2016 [facility] INTERV RAD   LAPAROSCOPIC APPENDECTOMY N/A 12/16/2015   Procedure: APPENDECTOMY LAPAROSCOPIC;  Surgeon: Coralie Keens, MD;  Location: [facility];  Service: General;  Laterality: N/A;   PENECTOMY      Family History  Problem Relation Age of Onset   Diabetes Mother    Hypertension Mother    Diabetes Maternal Grandmother    Hypertension Maternal Grandmother    Depression Maternal Grandmother     Social History   Socioeconomic History   Marital status: Single    Spouse name: Not on file   Number of children: Not on file   Years of education: Not on file   Highest education level: Not on file  Occupational History   Not on file  Tobacco Use   Smoking status: Some Days    Packs/day: 1.00    Years: 8.00    Pack years: 8.00    Types: Cigarettes   Smokeless tobacco: Never   Tobacco comments:    1 pack per day.  Vaping Use  Vaping Use: Never used  Substance and Sexual Activity   Alcohol use: Yes    Comment: occ   Drug use: Not Currently    Types: Marijuana    Comment: 3x wk   Sexual activity: Yes  Other Topics Concern   Not on file  Social History Narrative   Not on file   Social Determinants of Health   Financial Resource Strain: Not on file  Food Insecurity: Not on file  Transportation Needs: Not on file  Physical Activity: Not on file  Stress: Not on file  Social Connections: Not on file  Intimate Partner Violence: Not on file    Outpatient Medications Prior to Visit  Medication Sig Dispense Refill   acetaminophen (TYLENOL) 500 MG tablet Take  2 tablets (Total= 1,000 mg), by mouth, 2 times a day as needed. 120 tablet 3   amLODipine (NORVASC) 10 MG tablet Take 1 tablet (10 mg total) by mouth daily. 90 tablet 3   aspirin 325 MG tablet Take 1 tablet (325 mg total) by mouth daily. 30 tablet 0   Blood Glucose Monitoring Suppl (ACCU-CHEK GUIDE) w/Device KIT Use in the morning, at noon, in the evening, and at bedtime. 1 kit 0   ferrous sulfate 325 (65 FE) MG tablet Take 1 tablet (325 mg total) daily with breakfast by mouth. 30 tablet 3   glucose blood (TRUE METRIX BLOOD GLUCOSE TEST) test strip Use as instructed 100 each 12   Insulin Pen Needle 31G X 5 MM MISC Use as directed 100 each 11   Lancets (FREESTYLE) lancets Use as instructed 100 each 12   lisinopril (ZESTRIL) 20 MG tablet Take 1 tablet (20 mg total) by mouth daily. 90 tablet 3   insulin glargine (LANTUS) 100 UNIT/ML Solostar Pen Inject 40 Units into the skin at bedtime. 36 mL 3   Insulin Syringe-Needle U-100 (TRUEPLUS INSULIN SYRINGE) 31G X 5/16" 1 ML MISC USE AS DIRECTED. 100 each 3   atorvastatin (LIPITOR) 40 MG tablet Take 1 tablet (40 mg total) by mouth daily. (Patient not taking: Reported on 06/16/2021) 90 tablet 3   ibuprofen (ADVIL) 800 MG tablet take 1 tablet (800 mg) by mouth every 8 hours as needed for pain with food (Patient not taking: Reported on 06/16/2021) 30 tablet 0   amoxicillin (AMOXIL) 500 MG capsule TAKE 1 CAPSULE EVERY 8 HOURS UNTL FINISHED (Patient not taking: Reported on 06/16/2021) 21 capsule 0   ibuprofen (ADVIL) 800 MG tablet Take 1 tablet every 8 hours as needed for pain (Patient not taking: Reported on 06/16/2021) 15 tablet 0   insulin glargine (LANTUS) 100 UNIT/ML injection INJECT 30 UNITS INTO THE SKIN AT BEDTIME. 10 mL 11   lidocaine (LIDODERM) 5 % Place 1 patch onto the skin daily. Remove & Discard patch within 12 hours or as directed by MD (Patient not taking: Reported on 10/16/2020) 30 patch 0   No facility-administered medications prior to visit.     No Known Allergies  ROS Review of Systems    Objective:    Physical Exam Constitutional:      Appearance: He is normal weight.  HENT:     Head: Normocephalic and atraumatic.     Nose: Nose normal.     Mouth/Throat:     Mouth: Mucous membranes are moist.  Cardiovascular:     Rate and Rhythm: Normal rate and regular rhythm.     Pulses: Normal pulses.     Heart sounds: Normal heart sounds.  Pulmonary:  Effort: Pulmonary effort is normal.     Comments: Diminished  Abdominal:     Palpations: Abdomen is soft.  Musculoskeletal:     Cervical back: Normal range of motion.     Right lower leg: No edema.     Left lower leg: No edema.  Skin:    General: Skin is warm.     Capillary Refill: Capillary refill takes less than 2 seconds.  Neurological:     General: No focal deficit present.     Mental Status: He is alert.  Psychiatric:        Mood and Affect: Mood normal.        Behavior: Behavior normal.        Thought Content: Thought content normal.        Judgment: Judgment normal.    BP 137/70    Pulse 73    Temp 97.9 F (36.6 C)    Ht 5' 10"  (1.778 m)    Wt 185 lb 12.8 oz (84.3 kg)    SpO2 98%    BMI 26.66 kg/m  Wt Readings from Last 3 Encounters:  06/16/21 185 lb 12.8 oz (84.3 kg)  10/16/20 179 lb 0.6 oz (81.2 kg)  02/28/20 182 lb (82.6 kg)     There are no preventive care reminders to display for this patient.   There are no preventive care reminders to display for this patient.  Lab Results  Component Value Date   TSH 1.450 01/22/2020   Lab Results  Component Value Date   WBC 8.3 10/16/2020   HGB 12.8 (L) 10/16/2020   HCT 43.8 10/16/2020   MCV 66 (L) 10/16/2020   PLT 173 10/16/2020   Lab Results  Component Value Date   NA 135 10/16/2020   K 4.2 10/16/2020   CO2 25 01/21/2020   GLUCOSE 278 (H) 10/16/2020   BUN 14 10/16/2020   CREATININE 0.91 10/16/2020   BILITOT 0.3 10/16/2020   ALKPHOS 74 10/16/2020   AST 13 10/16/2020   ALT 20  01/21/2020   PROT 6.5 10/16/2020   ALBUMIN 4.7 10/16/2020   CALCIUM 9.2 10/16/2020   ANIONGAP 15 01/21/2020   EGFR 107 10/16/2020   Lab Results  Component Value Date   CHOL 141 10/16/2020   Lab Results  Component Value Date   HDL 30 (L) 10/16/2020   Lab Results  Component Value Date   LDLCALC 69 10/16/2020   Lab Results  Component Value Date   TRIG 256 (H) 10/16/2020   Lab Results  Component Value Date   CHOLHDL 4.7 10/16/2020   Lab Results  Component Value Date   HGBA1C 9.6 (A) 06/16/2021   HGBA1C 9.6 06/16/2021   HGBA1C 9.6 (A) 06/16/2021   HGBA1C 9.6 (A) 06/16/2021      Assessment & Plan:   Problem List Items Addressed This Visit       Cardiovascular and Mediastinum   Ischemic stroke ([place])     Other   Tobacco use Persistent    Hyperlipidemia Persistent    Relevant Orders   Lipid panel   Anxiety and depression Worsening    Other Visit Diagnoses     Type 2 diabetes mellitus with hyperglycemia, without long-term current use of insulin (HCC)    -  Primary Uncontrolled  Encourage compliance with current treatment regimen  Dose adjustment refilled  Encourage regular CBG monitoring Encourage contacting office if excessive hyperglycemia and or hypoglycemia Lifestyle modification with healthy diet (fewer calories, more high fiber foods, whole  grains and non-starchy vegetables, lower fat meat and fish, low-fat diary include healthy oils) regular exercise (physical activity) and weight loss Opthalmology exam discussed  Nutritional consult recommended Regular dental visits encouraged Home BP monitoring also encouraged goal <130/80    Relevant Medications   insulin glargine (LANTUS SOLOSTAR) 100 UNIT/ML Solostar Pen   Other Relevant Orders   HgB A1c (Completed)   POCT URINALYSIS DIP (CLINITEK) (Completed)   Comp. Metabolic Panel (12)   Lipid panel   Screening for thyroid disorder       Relevant Orders   TSH   Screening PSA (prostate specific antigen)        Relevant Orders   PSA   Screening for deficiency anemia       Relevant Orders   CBC with Differential/Platelet   Colon cancer screening       Relevant Orders   Ambulatory referral to Gastroenterology   Blindness, one eye       Leukocytes in urine       Relevant Orders   Urine Culture   Home health referral placed.     Meds ordered this encounter  Medications   insulin glargine (LANTUS SOLOSTAR) 100 UNIT/ML Solostar Pen    Sig: Inject 30 Units into the skin at bedtime.    Dispense:  15 mL    Refill:  PRN    Order Specific Question:   Supervising Provider    Answer:   Tresa Garter [[id number]]    Follow-up: Return in about 3 months (around 09/13/2021) for follow up [id number].    Vevelyn Francois, NP

## 2021-06-16 NOTE — Patient Instructions (Signed)

## 2021-06-16 NOTE — BH Specialist Note (Signed)
Integrated Behavioral Health Initial In-Person Visit  MRN: [id number] Name: Frank Schneider  Number of [facility] Clinician visits: 1- Initial Visit  Session Start time: 0175    Session End time: 1330  Total time in minutes: 45   Types of Service: Individual psychotherapy  Interpretor:No. Interpretor Name and Language: none  Subjective: Frank Schneider is a 46 y.o. male accompanied by  self Patient was referred by self for depression, stress. Patient reports the following symptoms/concerns: depression, stress Duration of problem: several months; Severity of problem: severe  Objective: Mood: Euthymic and Affect: Appropriate Risk of harm to self or others: No plan to harm self or others  Life Context: Family and Social: has family in [place], lives alone School/Work: not working or in school Self-Care:  Life Changes: strokes in the past several years, now receiving disability income  Patient and/or Family's Strengths/Protective Factors: Concrete supports in place (healthy food, safe environments, etc.)  Goals Addressed: Patient will: Reduce symptoms of: depression Increase knowledge and/or ability of: coping skills, self-management skills, and stress reduction  Demonstrate ability to: Increase healthy adjustment to current life circumstances  Progress towards Goals: Ongoing  Interventions: Interventions utilized: Supportive Counseling and Link to [employer]  Standardized Assessments completed: Not Needed  Supportive counseling today around challenges patient has encountered in recent months/past year when trying to obtain assistance or navigate health care and social services. Patient feeling unsupported and needs assistance connecting with community resources. Completed Access [place] application with patient and submitted. Completed referral to [employer] community support and nutrition program for food delivery. Patient also interested  in having a representative payee for assistance managing his money; CSW to follow and assist on this.   Patient and/or Family Response: Patient engaged in session.  Patient Centered Plan: Patient is on the following Treatment Plan(s):  depression  Assessment: Patient currently experiencing depression related to multiple stressors and health concerns. Patient has a history of strokes and reports difficulty in remembering things and completing daily tasks. He felt he had no support and this exacerbated depression.   Patient may benefit from CBT and supportive counseling as well as continued assistance in accessing community resources to increase his support network.  Plan: Follow up with behavioral health clinician on: 06/23/21 Referral(s): [facility] (In Clinic) and [employer] Resources:  Food and Transportation  Frank Schneider, [facility] [phone]

## 2021-06-17 ENCOUNTER — Other Ambulatory Visit: Payer: Self-pay

## 2021-06-17 LAB — COMP. METABOLIC PANEL (12)
AST: 14 IU/L (ref 0–40)
Albumin/Globulin Ratio: 2.1 (ref 1.2–2.2)
Albumin: 4.8 g/dL (ref 4.0–5.0)
Alkaline Phosphatase: 84 IU/L (ref 44–121)
BUN/Creatinine Ratio: 15 (ref 9–20)
BUN: 15 mg/dL (ref 6–24)
Bilirubin Total: 0.4 mg/dL (ref 0.0–1.2)
Calcium: 9.7 mg/dL (ref 8.7–10.2)
Chloride: 101 mmol/L (ref 96–106)
Creatinine, Ser: 1.03 mg/dL (ref 0.76–1.27)
Globulin, Total: 2.3 g/dL (ref 1.5–4.5)
Glucose: 280 mg/dL — ABNORMAL HIGH (ref 70–99)
Potassium: 4.8 mmol/L (ref 3.5–5.2)
Sodium: 139 mmol/L (ref 134–144)
Total Protein: 7.1 g/dL (ref 6.0–8.5)
eGFR: 91 mL/min/{1.73_m2} (ref >59)

## 2021-06-17 LAB — CBC WITH DIFFERENTIAL/PLATELET
Basophils Absolute: 0.1 10*3/uL (ref 0.0–0.2)
Basos: 0 %
EOS (ABSOLUTE): 0.3 10*3/uL (ref 0.0–0.4)
Eos: 2 %
Hematocrit: 44.6 % (ref 37.5–51.0)
Hemoglobin: 13.7 g/dL (ref 13.0–17.7)
Immature Grans (Abs): 0 10*3/uL (ref 0.0–0.1)
Immature Granulocytes: 0 %
Lymphocytes Absolute: 3.7 10*3/uL — ABNORMAL HIGH (ref 0.7–3.1)
Lymphs: 28 %
MCH: 19.5 pg — ABNORMAL LOW (ref 26.6–33.0)
MCHC: 30.7 g/dL — ABNORMAL LOW (ref 31.5–35.7)
MCV: 63 fL — ABNORMAL LOW (ref 79–97)
Monocytes Absolute: 0.6 10*3/uL (ref 0.1–0.9)
Monocytes: 5 %
Neutrophils Absolute: 8.5 10*3/uL — ABNORMAL HIGH (ref 1.4–7.0)
Neutrophils: 65 %
Platelets: 198 10*3/uL (ref 150–450)
RBC: 7.04 x10E6/uL (ref 4.14–5.80)
RDW: 18.6 % — ABNORMAL HIGH (ref 11.6–15.4)
WBC: 13.1 10*3/uL — ABNORMAL HIGH (ref 3.4–10.8)

## 2021-06-17 LAB — PSA: Prostate Specific Ag, Serum: 0.4 ng/mL (ref 0.0–4.0)

## 2021-06-17 LAB — TSH: TSH: 1.56 u[IU]/mL (ref 0.450–4.500)

## 2021-06-17 LAB — LIPID PANEL
Chol/HDL Ratio: 4.3 ratio (ref 0.0–5.0)
Cholesterol, Total: 156 mg/dL (ref 100–199)
HDL: 36 mg/dL — ABNORMAL LOW (ref >39)
LDL Chol Calc (NIH): 85 mg/dL (ref 0–99)
Triglycerides: 209 mg/dL — ABNORMAL HIGH (ref 0–149)
VLDL Cholesterol Cal: 35 mg/dL (ref 5–40)

## 2021-06-18 ENCOUNTER — Other Ambulatory Visit: Payer: Self-pay

## 2021-06-18 ENCOUNTER — Telehealth: Payer: Self-pay | Admitting: Clinical

## 2021-06-18 LAB — URINE CULTURE

## 2021-06-18 NOTE — Telephone Encounter (Signed)
Integrated Behavioral Health General Follow Up Note  06/18/2021 Name: Frank Schneider MRN: [id number] DOB: 1975/08/30 Frank Schneider is a 46 y.o. year old male who sees Vevelyn Francois, NP for primary care. LCSW was consulted to assess patient and assist with mental health concerns.  Interpreter: No.   Interpreter Name & Language: none  Assessment: Patient experiencing mental health concerns.  Ongoing Intervention: Patient called today with questions about how to obtain his medication from the pharmacy, as he has transportation barriers. He indicated that the pharmacy is delivering his most recent fills, but will not offer that service going forward. CSW to work with patient on transportation and delivery options so he can get his medication refills successfully.  Also called [name] together with patient and scheduled appointment for patient as he wants to have a representative payee appointed to manage his finances.   Review of patient status, including review of consultants reports, relevant laboratory and other test results, and collaboration with appropriate care team members and the patient's provider was performed as part of comprehensive patient evaluation and provision of services.    [name], [facility] [phone]

## 2021-06-19 ENCOUNTER — Other Ambulatory Visit: Payer: Self-pay | Admitting: Nurse Practitioner

## 2021-06-19 DIAGNOSIS — D72829 Elevated white blood cell count, unspecified: Secondary | ICD-10-CM

## 2021-06-19 DIAGNOSIS — R718 Other abnormality of red blood cells: Secondary | ICD-10-CM

## 2021-06-23 ENCOUNTER — Ambulatory Visit: Payer: Self-pay | Admitting: Clinical

## 2021-06-30 ENCOUNTER — Other Ambulatory Visit: Payer: Self-pay

## 2021-06-30 ENCOUNTER — Ambulatory Visit (INDEPENDENT_AMBULATORY_CARE_PROVIDER_SITE_OTHER): Payer: Medicaid Other | Admitting: Clinical

## 2021-06-30 DIAGNOSIS — F32A Depression, unspecified: Secondary | ICD-10-CM

## 2021-06-30 NOTE — BH Specialist Note (Signed)
Integrated Behavioral Health Follow Up In-Person Visit ? ?MRN: [id number] ?Name: Frank Schneider ? ?Number of [facility] Clinician visits: 2- Second Visit ? ?Session Start time: 1430 ?  ?Session End time: 4196 ? ?Total time in minutes: 25 ? ? ?Types of Service: Individual psychotherapy ? ?Interpretor:No. Interpretor Name and Language: none ? ?Subjective: ?Frank Schneider is a 46 y.o. male accompanied by  self ?Patient was referred by self for depression, stress. ?Patient reports the following symptoms/concerns: depression, stress ?Duration of problem: several months; Severity of problem: moderate ? ?Objective: ?Mood: Euthymic and Affect: Appropriate ?Risk of harm to self or others: No plan to harm self or others ? ?Patient and/or Family's Strengths/Protective Factors: ?Social connections and Concrete supports in place (healthy food, safe environments, etc.) ? ?Goals Addressed: ?Patient will: ? Reduce symptoms of: depression  ? Demonstrate ability to: Increase healthy adjustment to current life circumstances and Increase adequate support systems for patient/family ? ?Progress towards Goals: ?Achieved ? ?Interventions: ?Interventions utilized:  Supportive Counseling and Link to [employer] ?Standardized Assessments completed: Not Needed ? ?Reviewed referrals to community resources made recently. Coordinated with patient's PCP regarding home health referral. Assisted patient in making follow up appointment with PCP. Reminded patient of appointment at [employer] this week for representative payee. Brief supportive counseling today. Patient reported he is doing much better now that he has support in accessing resources. ? ?Patient and/or Family Response: Patient engaged in session. ? ?Patient Centered Plan: ?Patient is on the following Treatment Plan(s): depression ? ?Assessment: ?Patient currently experiencing depression related to multiple stressors and health concerns. Patient has a  history of strokes and reports difficulty in remembering things and completing daily tasks. He felt he had no support and this exacerbated depression. He reports feeling much better than a few weeks ago when he called the clinic requesting assistance.  ?  ?Patient may benefit from continued assistance in accessing community resources to increase his support network. ? ?Plan: ?Follow up with behavioral health clinician on: Available to assist with community resources as needed by phone ?Referral(s): [employer] Resources:  Haematologist, Publishing rights manager, and Transportation ? ?Frank Emms, LCSW ?[name] ?[name] ?[phone] ? ? ?

## 2021-07-07 ENCOUNTER — Encounter: Payer: Self-pay | Admitting: Nurse Practitioner

## 2021-07-07 ENCOUNTER — Ambulatory Visit (INDEPENDENT_AMBULATORY_CARE_PROVIDER_SITE_OTHER): Payer: Medicaid Other | Admitting: Nurse Practitioner

## 2021-07-07 ENCOUNTER — Other Ambulatory Visit: Payer: Self-pay

## 2021-07-07 VITALS — BP 147/84 | HR 69 | Temp 98.0°F | Ht 70.0 in | Wt 182.0 lb

## 2021-07-07 DIAGNOSIS — M79671 Pain in right foot: Secondary | ICD-10-CM | POA: Diagnosis not present

## 2021-07-07 DIAGNOSIS — E1165 Type 2 diabetes mellitus with hyperglycemia: Secondary | ICD-10-CM | POA: Diagnosis not present

## 2021-07-07 DIAGNOSIS — M2041 Other hammer toe(s) (acquired), right foot: Secondary | ICD-10-CM

## 2021-07-07 DIAGNOSIS — M2042 Other hammer toe(s) (acquired), left foot: Secondary | ICD-10-CM | POA: Diagnosis not present

## 2021-07-07 DIAGNOSIS — M79672 Pain in left foot: Secondary | ICD-10-CM

## 2021-07-07 NOTE — Progress Notes (Signed)
? ?[place] ?[place], [place]  [address] ?Phone:  [phone]   Fax:  [phone] ? ? ?Established Patient Office Visit ? ?Subjective:  ?Patient ID: Frank Schneider, male    DOB: 09-12-1975  Age: 46 y.o. MRN: [id number] ? ?CC:  ?Chief Complaint  ?Patient presents with  ? OFFICE VISIT  ?  Pt stated he has pain in both feet pt would like to have surgery done on his feet  ? ? ?HPI ?Skylar Lupercio presents for foot pain. He  has a past medical history of Blind right eye, Diabetes mellitus, History of appendicitis (11/2015), Hypertension, Left flank pain (12/2019), Priapism, Stroke ([place]) (04/2015), Tobacco use, Vision abnormalities, and Vitamin D deficiency (12/2019).  ? ?He presents with bilateral foot pain. Onset of the symptoms was several years ago. Precipitating event: none known. Current symptoms include: ability to bear weight, but with some pain. Aggravating factors: any weight bearing. Symptoms have gradually worsened. Patient has had no prior foot problems. Evaluation to date: plain films: no description.  . 4/10. He is unable to describe. He denies numbness or burning. He has tingling throughout failed gabapentin. He does not want to be on medication that he has to remainder of life.  ? ?He has restarted his Lantus 30-40 units. He reports that his is not the best. He going to get food assistance that is suppose to be healthy. He is eating more fruits and vegetables.  ?Past Medical History:  ?Diagnosis Date  ? Blind right eye   ? Diabetes mellitus   ? Takes Metformin  ? History of appendicitis 11/2015  ? Hypertension   ? Left flank pain 12/2019  ? Priapism   ? Stroke [facility]) 04/2015  ? Tobacco use   ? Vision abnormalities   ? Vitamin D deficiency 12/2019  ? ? ?Past Surgical History:  ?Procedure Laterality Date  ? APPENDECTOMY    ? EYE SURGERY    ? cataract removed 05/27/2016 right eye   ? INCISION AND DRAINAGE PERIRECTAL ABSCESS  07/28/2011  ? Procedure: IRRIGATION AND  DEBRIDEMENT PERIRECTAL ABSCESS;  Surgeon: Adin Hector, MD;  Location: [facility];  Service: General;  Laterality: N/A;   of skin muscle and subcutaneous tissue of perimeum  8cmx12cm area ?  ? IR GENERIC HISTORICAL  01/08/2016  ? IR RADIOLOGIST EVAL & MGMT 01/08/2016 [facility] INTERV RAD  ? LAPAROSCOPIC APPENDECTOMY N/A 12/16/2015  ? Procedure: APPENDECTOMY LAPAROSCOPIC;  Surgeon: Coralie Keens, MD;  Location: [facility];  Service: General;  Laterality: N/A;  ? PENECTOMY    ? ? ?Family History  ?Problem Relation Age of Onset  ? Diabetes Mother   ? Hypertension Mother   ? Diabetes Maternal Grandmother   ? Hypertension Maternal Grandmother   ? Depression Maternal Grandmother   ? ? ?Social History  ? ?Socioeconomic History  ? Marital status: Single  ?  Spouse name: Not on file  ? Number of children: Not on file  ? Years of education: Not on file  ? Highest education level: Not on file  ?Occupational History  ? Not on file  ?Tobacco Use  ? Smoking status: Some Days  ?  Packs/day: 1.00  ?  Years: 8.00  ?  Pack years: 8.00  ?  Types: Cigarettes  ? Smokeless tobacco: Never  ? Tobacco comments:  ?  1 pack per day.  ?Vaping Use  ? Vaping Use: Never used  ?Substance and Sexual Activity  ? Alcohol use: Yes  ?  Comment: occ  ? Drug use: Not Currently  ?  Types: Marijuana  ?  Comment: 3x wk  ? Sexual activity: Yes  ?Other Topics Concern  ? Not on file  ?Social History Narrative  ? Not on file  ? ?Social Determinants of Health  ? ?Financial Resource Strain: Not on file  ?Food Insecurity: Not on file  ?Transportation Needs: Not on file  ?Physical Activity: Not on file  ?Stress: Not on file  ?Social Connections: Not on file  ?Intimate Partner Violence: Not on file  ? ? ?Outpatient Medications Prior to Visit  ?Medication Sig Dispense Refill  ? acetaminophen (TYLENOL) 500 MG tablet Take 2 tablets (Total= 1,000 mg), by mouth, 2 times a day as needed. 120 tablet 3  ? amLODipine (NORVASC) 10 MG tablet Take 1 tablet (10 mg total) by mouth  daily. 90 tablet 3  ? aspirin 325 MG tablet Take 1 tablet (325 mg total) by mouth daily. 30 tablet 0  ? Blood Glucose Monitoring Suppl (ACCU-CHEK GUIDE) w/Device KIT Use in the morning, at noon, in the evening, and at bedtime. 1 kit 0  ? ferrous sulfate 325 (65 FE) MG tablet Take 1 tablet (325 mg total) daily with breakfast by mouth. 30 tablet 3  ? glucose blood (TRUE METRIX BLOOD GLUCOSE TEST) test strip Use as instructed 100 each 12  ? ibuprofen (ADVIL) 800 MG tablet take 1 tablet (800 mg) by mouth every 8 hours as needed for pain with food 30 tablet 0  ? insulin glargine (LANTUS SOLOSTAR) 100 UNIT/ML Solostar Pen Inject 30 Units into the skin at bedtime. 15 mL 0  ? Insulin Pen Needle 31G X 5 MM MISC Use as directed 100 each 11  ? Lancets (FREESTYLE) lancets Use as instructed 100 each 12  ? lisinopril (ZESTRIL) 20 MG tablet Take 1 tablet (20 mg total) by mouth daily. 90 tablet 3  ? atorvastatin (LIPITOR) 40 MG tablet Take 1 tablet (40 mg total) by mouth daily. (Patient not taking: Reported on 06/16/2021) 90 tablet 3  ? ?No facility-administered medications prior to visit.  ? ? ?No Known Allergies ? ?ROS ?Review of Systems ? ?  ?Objective:  ?  ?Physical Exam ?Cardiovascular:  ?   Pulses:     ?     Dorsalis pedis pulses are 1+ on the right side and 1+ on the left side.  ?Musculoskeletal:  ?   Right foot: Bunion present.  ?   Left foot: Bunion present.  ?Feet:  ?   Right foot:  ?   Skin integrity: Callus (right) present.  ?   Left foot:  ?   Skin integrity: Callus (left outer ball of foot) present.  ?   Comments: Bilateral hammer toe. ? ?BP (!) 147/84 (BP Location: Left Arm, Patient Position: Sitting, Cuff Size: Normal)   Pulse 69   Temp 98 ?F (36.7 ?C)   Ht _0  (1.778 m)   Wt 182 lb (82.6 kg)   SpO2 100%   BMI 26.11 kg/m?  ?Wt Readings from Last 3 Encounters:  ?07/07/21 182 lb (82.6 kg)  ?06/16/21 185 lb 12.8 oz (84.3 kg)  ?10/16/20 179 lb 0.6 oz (81.2 kg)  ? ? ? ?Health Maintenance Due  ?Topic Date Due  ?  COVID-19 Vaccine (2 - Pfizer series) 01/24/2020  ? ? ?There are no preventive care reminders to display for this patient. ? ?Lab Results  ?Component Value Date  ? TSH 1.560 06/16/2021  ? ?Lab Results  ?Component  Value Date  ? WBC 13.1 (H) 06/16/2021  ? HGB 13.7 06/16/2021  ? HCT 44.6 06/16/2021  ? MCV 63 (L) 06/16/2021  ? PLT 198 06/16/2021  ? ?Lab Results  ?Component Value Date  ? NA 139 06/16/2021  ? K 4.8 06/16/2021  ? CO2 25 01/21/2020  ? GLUCOSE 280 (H) 06/16/2021  ? BUN 15 06/16/2021  ? CREATININE 1.03 06/16/2021  ? BILITOT 0.4 06/16/2021  ? ALKPHOS 84 06/16/2021  ? AST 14 06/16/2021  ? ALT 20 01/21/2020  ? PROT 7.1 06/16/2021  ? ALBUMIN 4.8 06/16/2021  ? CALCIUM 9.7 06/16/2021  ? ANIONGAP 15 01/21/2020  ? EGFR 91 06/16/2021  ? ?Lab Results  ?Component Value Date  ? CHOL 156 06/16/2021  ? ?Lab Results  ?Component Value Date  ? HDL 36 (L) 06/16/2021  ? ?Lab Results  ?Component Value Date  ? [place] 85 06/16/2021  ? ?Lab Results  ?Component Value Date  ? TRIG 209 (H) 06/16/2021  ? ?Lab Results  ?Component Value Date  ? CHOLHDL 4.3 06/16/2021  ? ?Lab Results  ?Component Value Date  ? HGBA1C 9.6 (A) 06/16/2021  ? HGBA1C 9.6 06/16/2021  ? HGBA1C 9.6 (A) 06/16/2021  ? HGBA1C 9.6 (A) 06/16/2021  ? ? ?  ?Assessment & Plan:  ? ?Problem List Items Addressed This Visit   ?None ?Visit Diagnoses   ? ? Type 2 diabetes mellitus with hyperglycemia, without long-term current use of insulin (HCC)    -  Primary ?Follow up on home health  ?His insurance is going to have to determine which home health agency  ? Relevant Orders  ? Ambulatory referral to Podiatry  ? Foot pain, bilateral      ? Relevant Orders  ? Ambulatory referral to Podiatry  ? ?Hammer toes of both feet ?Relevant Orders  ?Ambulatory referral to Podiatry  ?  ? ? ?No orders of the defined types were placed in this encounter. ? ? ?Follow-up: Return for Appointment As Scheduled.  ? ? ?Vevelyn Francois, NP ?

## 2021-07-07 NOTE — Patient Instructions (Signed)
Hammer Toe Hammer toe is a change in the shape, or a deformity, of the toe. The deformity causes the middle joint of the toe to stay bent. Hammer toe starts gradually. At first, the toe can be straightened. Then over time, the toe deformity becomes stiff, inflexible, and permanently bent. Hammer toe usually affects the second, third, or fourth toe. A hammer toe causes pain, especially when wearing shoes. Corns and calluses can result from the toe rubbing against the inside of the shoe. Early treatments to keep the toe straight may relieve pain. As the deformity of the toe becomes stiff and permanent, surgery may be needed to straighten the toe. What are the causes? This condition is caused by abnormal bending of the toe joint that is closest to your foot. Over time, the toe bending downward pulls on the muscles and connections (tendons) of the toe joint, making them weak and stiff. Wearing shoes that are too narrow in the toe box and do not allow toes to fully straighten can cause this condition. What increases the risk? You are more likely to develop this condition if you: Are an older male. Wear shoes that are too small, or wear high-heeled shoes that pinch your toes. Have a second toe that is longer than your big toe (first toe). Injure your foot or toe. Have arthritis, or have a nerve or muscle disorder. Have diabetes or a condition known as Charcot joint, which may cause you to walk abnormally. Have a family history of hammer toe. Are a ballet dancer. What are the signs or symptoms? Pain and deformity of the toe are the main symptoms of this condition. The pain is worse when wearing shoes, walking, or running. Other symptoms may include: A thickened patch of skin, called a corn or callus, that forms over the top of the bent part of the toe or between the toes. Redness and a burning feeling on the bent toe. An open sore that forms on the top of the bent toe. Not being able to straighten  the affected toe. How is this diagnosed? This condition is diagnosed based on your symptoms and a physical exam. During the exam, your health care provider will try to straighten your toe to see how stiff the deformity is. You may also have tests, such as: A blood test to check for rheumatoid arthritis or diabetes. An X-ray to show how severe the toe deformity is. How is this treated? Treatment for this condition depends on whether the toe is flexible or deformed and no longer moveable. In less severe cases, a hammer toe can be straightened without surgery. These treatments include: Taping the toe into a straightened position. Using pads and cushions to protect the bent toe. Wearing shoes that provide enough room for the toes. Doing toe-stretching exercises at home. Taking an NSAID, such as ibuprofen, to reduce pain and swelling. Using special orthotics or insoles for pain relief and to improve walking. If these treatments do not help or the toe has a severe deformity and cannot be straightened, surgery is the next option. The most common surgeries used to straighten a hammer toe include: Arthroplasty or osteotomy. Part of the toe joint is reconstructed or removed, which allows the toe to straighten. Fusion. Cartilage between the two bones of the joint is taken out, and the bones are fused together into one longer bone. Implantation. Part of the bone is removed and replaced with an implant to allow the toe to move again. Flexor tendon transfer.   The tendons that curl the toes down (flexor tendons) are repositioned. Follow these instructions at home: Take over-the-counter and prescription medicines only as told by your health care provider. Do toe-straightening and stretching exercises as told by your health care provider. Keep all follow-up visits. This is important. How is this prevented? Wear shoes that fit properly and give your toes enough room. Shoes should not cause pain. Buy shoes at  the end of the day to make sure they fit well, since your foot may swell during the day. Make sure they are comfortable before you buy them. As you age, your shoe size might change, including the width. Measure both feet and buy shoes for the larger foot. A shoe repair store might be able to stretch shoes that feel tight in spots. Do not wear high-heeled shoes or shoes with pointed toes. Contact a health care provider if: Your pain gets worse. Your toe becomes red or swollen. You develop an open sore on your toe. Summary Hammer toe is a condition that gradually causes your toe to become bent and stiff. Hammer toe can be treated by taping the toe into a straightened position and doing toe-stretching exercises. If these treatments do not help, surgery may be needed. To prevent this condition, wear shoes that fit properly, give your toes enough room, and do not cause pain. This information is not intended to replace advice given to you by your health care provider. Make sure you discuss any questions you have with your health care provider. Document Revised: 07/20/2019 Document Reviewed: 07/20/2019 Elsevier Patient Education  2022 Elsevier Inc.  

## 2021-07-08 ENCOUNTER — Other Ambulatory Visit: Payer: Self-pay

## 2021-07-09 ENCOUNTER — Other Ambulatory Visit: Payer: Self-pay | Admitting: Nurse Practitioner

## 2021-07-09 DIAGNOSIS — E1165 Type 2 diabetes mellitus with hyperglycemia: Secondary | ICD-10-CM

## 2021-07-09 DIAGNOSIS — M79671 Pain in right foot: Secondary | ICD-10-CM

## 2021-07-09 DIAGNOSIS — I10 Essential (primary) hypertension: Secondary | ICD-10-CM

## 2021-07-09 DIAGNOSIS — I639 Cerebral infarction, unspecified: Secondary | ICD-10-CM

## 2021-07-12 LAB — HGB FRACTIONATION CASCADE
Hgb A2: 6 % — ABNORMAL HIGH (ref 1.8–3.2)
Hgb A: 93.7 % — ABNORMAL LOW (ref 96.4–98.8)
Hgb F: 0.3 % (ref 0.0–2.0)
Hgb S: 0 %

## 2021-07-12 LAB — SPECIMEN STATUS REPORT

## 2021-07-29 ENCOUNTER — Telehealth: Payer: Self-pay | Admitting: Clinical

## 2021-07-29 NOTE — Telephone Encounter (Signed)
[facility] ?General Follow Up Note ? ?07/29/2021 ?Name: Frank Schneider MRN: [id number] DOB: [birth year] ?[name] is a 46 y.o. year old male who sees Vevelyn Francois, NP for primary care. LCSW was consulted to assess patient and assist with mental health concerns. ? ?Interpreter: No.   Interpreter Name & Language: none ? ?Assessment: Patient experiencing mental health concerns and other social/environmental needs.  ? ?Ongoing Intervention: Patient called today and reported he missed his podiatry appointment on 3/31. CSW assisted patient in calling the podiatry office to reschedule. Also scheduled transportation to the new appointment through [facility].  ? ?Review of patient status, including review of consultants reports, relevant laboratory and other test results, and collaboration with appropriate care team members and the patient's provider was performed as part of comprehensive patient evaluation and provision of services.   ? ?[name], LCSW ?Patient [name] ?[name] ?[phone] ?  ? ?

## 2021-08-04 DIAGNOSIS — I739 Peripheral vascular disease, unspecified: Secondary | ICD-10-CM | POA: Diagnosis not present

## 2021-08-04 DIAGNOSIS — L84 Corns and callosities: Secondary | ICD-10-CM | POA: Diagnosis not present

## 2021-08-04 DIAGNOSIS — E1142 Type 2 diabetes mellitus with diabetic polyneuropathy: Secondary | ICD-10-CM | POA: Diagnosis not present

## 2021-08-04 DIAGNOSIS — L603 Nail dystrophy: Secondary | ICD-10-CM | POA: Diagnosis not present

## 2021-08-04 DIAGNOSIS — M792 Neuralgia and neuritis, unspecified: Secondary | ICD-10-CM | POA: Diagnosis not present

## 2021-08-04 DIAGNOSIS — M2042 Other hammer toe(s) (acquired), left foot: Secondary | ICD-10-CM | POA: Diagnosis not present

## 2021-08-04 DIAGNOSIS — M2041 Other hammer toe(s) (acquired), right foot: Secondary | ICD-10-CM | POA: Diagnosis not present

## 2021-08-04 DIAGNOSIS — B351 Tinea unguium: Secondary | ICD-10-CM | POA: Diagnosis not present

## 2021-08-29 ENCOUNTER — Ambulatory Visit: Payer: Self-pay | Admitting: Nurse Practitioner

## 2021-09-03 ENCOUNTER — Telehealth: Payer: Self-pay | Admitting: Clinical

## 2021-09-03 NOTE — Telephone Encounter (Signed)
[facility] ?General Follow Up Note ? ?09/03/2021 ?Name: Frank Schneider MRN: [id number] DOB: Jul 01, 1975 ?Frank Schneider is a 46 y.o. year old male who sees Vevelyn Francois, NP for primary care. LCSW was consulted to assess patient and assist with mental health concerns. ? ?Interpreter: No.   Interpreter Name & Language: none ? ?Assessment: Patient experiencing mental health concerns and other social/environmental needs.  ? ?Ongoing Intervention: Patient called CSW last week and requested assistance scheduling with PCP for back issues. Scheduled patient at that time but he no-showed. Called patient today and he reported family issues kept him away from that appointment. Rescheduled for 09/08/21. CSW to arrange transportation with patient's insurance provider to this appointment.  ? ?Review of patient status, including review of consultants reports, relevant laboratory and other test results, and collaboration with appropriate care team members and the patient's provider was performed as part of comprehensive patient evaluation and provision of services.   ? ?[name], LCSW ?Patient [place] ?[place] ?[phone] ?  ? ?

## 2021-09-08 ENCOUNTER — Ambulatory Visit (INDEPENDENT_AMBULATORY_CARE_PROVIDER_SITE_OTHER): Payer: Medicaid Other | Admitting: Nurse Practitioner

## 2021-09-08 ENCOUNTER — Encounter: Payer: Self-pay | Admitting: Nurse Practitioner

## 2021-09-08 VITALS — BP 119/66 | HR 62 | Temp 98.5°F | Ht 70.0 in | Wt 189.0 lb

## 2021-09-08 DIAGNOSIS — E559 Vitamin D deficiency, unspecified: Secondary | ICD-10-CM

## 2021-09-08 NOTE — Patient Instructions (Addendum)
1. Vitamin D deficiency ? ?- Vitamin D, 25-hydroxy ? ?Follow up: ? ?Follow up as scheduled ? ?

## 2021-09-08 NOTE — Progress Notes (Addendum)
_0  ID: Frank Schneider, male    DOB: 07/22/1975, 46 y.o.   MRN: [id number]  Chief Complaint  Patient presents with   Back Pain    Pt stated he has pain in his lower back pain. Pt is requesting a rx for vitamin d    Referring provider: Vevelyn Francois, NP    HPI  Patient presents today for a follow-up visit.  He states that he does have a history of vitamin D deficiency.  He states that when his vitamin D level is low he feels low back pain.  He has been having back pain recently and would like to get his vitamin D level with vitamin D level rechecked.  We discussed that we can recheck this for him today.  Patient may start 1000 IU of vitamin D3 daily.  We will let him know we will get labs back if he does need prescription strength. Denies f/c/s, n/v/d, hemoptysis, PND, chest pain or edema.     No Known Allergies  Immunization History  Administered Date(s) Administered   Influenza,inj,Quad PF,6+ Mos 01/15/2015   PFIZER(Purple Top)SARS-COV-2 Vaccination 01/03/2020   Pneumococcal Polysaccharide-23 07/29/2011   Tdap 01/15/2015    Past Medical History:  Diagnosis Date   Blind right eye    Diabetes mellitus    Takes Metformin   History of appendicitis 11/2015   Hypertension    Left flank pain 12/2019   Priapism    Stroke ([place]) 04/2015   Tobacco use    Vision abnormalities    Vitamin D deficiency 12/2019    Tobacco History: Social History   Tobacco Use  Smoking Status Some Days   Packs/day: 1.00   Years: 8.00   Pack years: 8.00   Types: Cigarettes  Smokeless Tobacco Never  Tobacco Comments   1 pack per day.   Ready to quit: Not Answered Counseling given: Not Answered Tobacco comments: 1 pack per day.   Outpatient Encounter Medications as of 09/08/2021  Medication Sig   acetaminophen (TYLENOL) 500 MG tablet Take 2 tablets (Total= 1,000 mg), by mouth, 2 times a day as needed.   amLODipine (NORVASC) 10 MG tablet Take 1 tablet (10 mg total) by mouth daily.    aspirin 325 MG tablet Take 1 tablet (325 mg total) by mouth daily.   atorvastatin (LIPITOR) 40 MG tablet Take 1 tablet (40 mg total) by mouth daily.   Blood Glucose Monitoring Suppl (ACCU-CHEK GUIDE) w/Device KIT Use in the morning, at noon, in the evening, and at bedtime.   glucose blood (TRUE METRIX BLOOD GLUCOSE TEST) test strip Use as instructed   ibuprofen (ADVIL) 800 MG tablet take 1 tablet (800 mg) by mouth every 8 hours as needed for pain with food   insulin glargine (LANTUS SOLOSTAR) 100 UNIT/ML Solostar Pen Inject 30 Units into the skin at bedtime.   Insulin Pen Needle 31G X 5 MM MISC Use as directed   Lancets (FREESTYLE) lancets Use as instructed   lisinopril (ZESTRIL) 20 MG tablet Take 1 tablet (20 mg total) by mouth daily.   ferrous sulfate 325 (65 FE) MG tablet Take 1 tablet (325 mg total) daily with breakfast by mouth. (Patient not taking: Reported on 09/08/2021)   No facility-administered encounter medications on file as of 09/08/2021.     Review of Systems  Review of Systems  Constitutional: Negative.   HENT: Negative.    Cardiovascular: Negative.   Gastrointestinal: Negative.   Allergic/Immunologic: Negative.   Neurological: Negative.   Psychiatric/Behavioral: Negative.  Physical Exam  BP 119/66 (BP Location: Right Arm, Patient Position: Sitting, Cuff Size: Normal)   Pulse 62   Temp 98.5 F (36.9 C)   Ht _0  (1.778 m)   Wt 189 lb (85.7 kg)   SpO2 100%   BMI 27.12 kg/m   Wt Readings from Last 5 Encounters:  09/08/21 189 lb (85.7 kg)  07/07/21 182 lb (82.6 kg)  06/16/21 185 lb 12.8 oz (84.3 kg)  10/16/20 179 lb 0.6 oz (81.2 kg)  02/28/20 182 lb (82.6 kg)     Physical Exam Vitals and nursing note reviewed.  Constitutional:      General: He is not in acute distress.    Appearance: He is well-developed.  Cardiovascular:     Rate and Rhythm: Normal rate and regular rhythm.  Pulmonary:     Effort: Pulmonary effort is normal.     Breath  sounds: Normal breath sounds.  Skin:    General: Skin is warm and dry.  Neurological:     Mental Status: He is alert and oriented to person, place, and time.     Lab Results:  CBC    Component Value Date/Time   WBC 13.1 (H) 06/16/2021 1435   WBC 11.5 (H) 01/21/2020 1650   RBC 7.04 (HH) 06/16/2021 1435   RBC 6.42 (H) 01/21/2020 1650   HGB 13.7 06/16/2021 1435   HCT 44.6 06/16/2021 1435   PLT 198 06/16/2021 1435   MCV 63 (L) 06/16/2021 1435   MCH 19.5 (L) 06/16/2021 1435   MCH 19.9 (L) 01/21/2020 1650   MCHC 30.7 (L) 06/16/2021 1435   MCHC 31.3 01/21/2020 1650   RDW 18.6 (H) 06/16/2021 1435   LYMPHSABS 3.7 (H) 06/16/2021 1435   MONOABS 0.7 09/18/2018 1430   EOSABS 0.3 06/16/2021 1435   BASOSABS 0.1 06/16/2021 1435    BMET    Component Value Date/Time   NA 139 06/16/2021 1435   K 4.8 06/16/2021 1435   CL 101 06/16/2021 1435   CO2 25 01/21/2020 1650   GLUCOSE 280 (H) 06/16/2021 1435   GLUCOSE 69 (L) 01/21/2020 1650   BUN 15 06/16/2021 1435   CREATININE 1.03 06/16/2021 1435   CREATININE 1.02 03/12/2017 1226   CALCIUM 9.7 06/16/2021 1435   GFRNONAA >60 01/21/2020 1650   GFRNONAA 91 03/12/2017 1226   GFRAA >60 01/21/2020 1650   GFRAA 105 03/12/2017 1226    BNP No results found for: BNP  ProBNP No results found for: PROBNP  Imaging: No results found.   Assessment & Plan:   Vitamin D deficiency - Vitamin D, 25-hydroxy  Follow up:  Follow up as scheduled     [name], NP 09/15/2021

## 2021-09-09 LAB — VITAMIN D 25 HYDROXY (VIT D DEFICIENCY, FRACTURES): Vit D, 25-Hydroxy: 84.3 ng/mL (ref 30.0–100.0)

## 2021-09-15 ENCOUNTER — Ambulatory Visit: Payer: Self-pay | Admitting: Nurse Practitioner

## 2021-09-15 ENCOUNTER — Encounter: Payer: Self-pay | Admitting: Nurse Practitioner

## 2021-09-15 DIAGNOSIS — E559 Vitamin D deficiency, unspecified: Secondary | ICD-10-CM | POA: Insufficient documentation

## 2021-09-15 NOTE — Assessment & Plan Note (Signed)
-   Vitamin D, 25-hydroxy  Follow up:  Follow up as scheduled

## 2021-09-16 ENCOUNTER — Telehealth: Payer: Self-pay | Admitting: Clinical

## 2021-09-16 NOTE — Telephone Encounter (Signed)
[facility] General Follow Up Note  09/16/2021 Name: [name] MRN: [id number] DOB: 1975/07/24 Frank Schneider is a 46 y.o. year old male who sees Vevelyn Francois, NP for primary care. LCSW was consulted to assess patient and assist with mental health concerns.  Interpreter: No.   Interpreter Name & Language: none  Assessment: Patient experiencing mental health concerns and other social/environmental needs.   Ongoing Intervention: Patient called CSW and asked about his lab results, vitamin D levels. Per chart, provider sent patient a message advising that vitamin D level was normal; CSW advised patient of this.   Patient would also like to change his pharmacy to one that delivers, as he does not have reliable transportation. He is agreeable to using [facility], as they provide delivery. CSW to coordinate with PCP on transferring his prescriptions to this location.  Patient needs assistance with transportation to his follow up appointment with PCP on 09/18/21. CSW scheduled transportation through patient's insurance, with [employer].  Review of patient status, including review of consultants reports, relevant laboratory and other test results, and collaboration with appropriate care team members and the patient's provider was performed as part of comprehensive patient evaluation and provision of services.    [name], [facility] [phone]

## 2021-09-18 ENCOUNTER — Ambulatory Visit: Payer: Self-pay | Admitting: Nurse Practitioner

## 2021-09-23 ENCOUNTER — Ambulatory Visit: Payer: Self-pay | Admitting: Nurse Practitioner

## 2021-09-24 ENCOUNTER — Telehealth: Payer: Self-pay | Admitting: Clinical

## 2021-09-24 NOTE — Telephone Encounter (Addendum)
[facility] General Follow Up Note  09/24/2021 Name: Frank Schneider MRN: [id number] DOB: [birth year] Frank Schneider is a 46 y.o. year old male who sees [name], NP for primary care. LCSW was consulted to assess patient and assist with mental health concerns.  Interpreter: No.   Interpreter Name & Language: none  Assessment: Patient experiencing mental health concerns and other social/environmental needs.   Ongoing Intervention: Patient called CSW and requested assistance with utility bill payment. He has already checked with social services and they do not have any funds left for assistance. CSW coordinating with [employer], building services to apply for assistance with [facility] Patient Assistance fund for patient.  Patient would also like to see a chiropractor. Called patient's insurance plan together with patient to confirm benefits and they indicated patient will need a referral from PCP for this specialist. CSW to coordinate with PCP and referral coordinator on this.   Review of patient status, including review of consultants reports, relevant laboratory and other test results, and collaboration with appropriate care team members and the patient's provider was performed as part of comprehensive patient evaluation and provision of services.    [name], [name] [phone]

## 2021-09-25 ENCOUNTER — Ambulatory Visit: Payer: Self-pay | Admitting: Nurse Practitioner

## 2021-09-30 ENCOUNTER — Telehealth: Payer: Self-pay | Admitting: Clinical

## 2021-09-30 NOTE — Telephone Encounter (Signed)
Integrated Behavioral Health General Follow Up Note  09/30/2021 Name: Frank Schneider MRN: [id number] DOB: 1975-11-20 Frank Schneider is a 46 y.o. year old male who sees Frank Foy, NP for primary care. LCSW was consulted to assess patient and assist with mental health concerns.  Interpreter: No.   Interpreter Name & Language: none  Assessment: Patient experiencing mental health concerns and other social/environmental needs.   Ongoing Intervention: Patient called CSW to discuss ongoing utility bill issue. He also has encountered an issue with a guest he allowed to stay with him for a few days no refusing to leave. 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 CSW also recommended patient seek advice from Legal Aid and advised patient of their clinic [place] on Tuesdays and Wednesdays. CSW to continue to follow for support.  Review of patient status, including review of consultants reports, relevant laboratory and other test results, and collaboration with appropriate care team members and the patient's provider was performed as part of comprehensive patient evaluation and provision of services.    Frank Schneider, [facility] [phone]

## 2021-10-01 ENCOUNTER — Ambulatory Visit: Payer: Self-pay | Admitting: Nurse Practitioner

## 2021-10-02 ENCOUNTER — Ambulatory Visit (INDEPENDENT_AMBULATORY_CARE_PROVIDER_SITE_OTHER): Payer: Medicaid Other | Admitting: Clinical

## 2021-10-02 DIAGNOSIS — F32A Depression, unspecified: Secondary | ICD-10-CM | POA: Diagnosis not present

## 2021-10-02 NOTE — BH Specialist Note (Signed)
Integrated Behavioral Health via Telemedicine Visit  10/02/2021 Hussien Greenblatt [id number]  Number of [place] Clinician visits: 3- Third Visit  Session Start time: 1300   Session End time: 1320  Total time in minutes: 20   Referring Provider: Dionisio David, NP Patient/Family location: [place] (friend's home) [facility] Provider location: home All persons participating in visit: CSW, patient Types of Service: Individual psychotherapy and Telephone visit  I connected with Ilean Skill via Telephone and verified that I am speaking with the correct person using two identifiers. Discussed confidentiality: Yes   I discussed the limitations of telemedicine and the availability of in person appointments.  Discussed there is a possibility of technology failure and discussed alternative modes of communication if that failure occurs.  I discussed that engaging in this telemedicine visit, they consent to the provision of behavioral healthcare and the services will be billed under their insurance.  Patient and/or legal guardian expressed understanding and consented to Telemedicine visit: Yes   Presenting Concerns: Patient and/or family reports the following symptoms/concerns: depression Duration of problem: several months; Severity of problem: moderate  Patient and/or Family's Strengths/Protective Factors: Social connections and [place] supports in place (healthy food, safe environments, etc.)  Goals Addressed: Patient will:  Reduce symptoms of: depression   Increase knowledge and/or ability of: coping skills and self-management skills   Demonstrate ability to: Increase healthy adjustment to current life circumstances and Increase adequate support systems for patient/family  Progress towards Goals: Ongoing  Interventions: Interventions utilized:  Supportive Counseling and Link to [employer] Standardized Assessments completed: Not Needed  Brief  supportive counseling today via phone. Patient experiencing problems with his housing situation. Discussed referral to Legal Aid and [employer] and [employer] Program for assistance; patient agreeable to this and CSW to make referrals. Will also reach out to housing authority for additional information. Provided emotional validation and reflective listening. Encouraged patient to stay connected with people he feels supported by.   Patient and/or Family Response: Patient engaged in session.  Assessment: Patient currently experiencing depression related to multiple stressors and health concerns. He is currently facing challenges with his housing situation.   Patient may benefit from CBT and supportive counseling as well as continued assistance in accessing community resources to increase his support network.  Plan: Follow up with behavioral health clinician on: will follow up by phone Referral(s): [place] (In Clinic)  I discussed the assessment and treatment plan with the patient and/or parent/guardian. They were provided an opportunity to ask questions and all were answered. They agreed with the plan and demonstrated an understanding of the instructions.   They were advised to call back or seek an in-person evaluation if the symptoms worsen or if the condition fails to improve as anticipated.  [name], [facility] [phone]

## 2021-10-08 ENCOUNTER — Telehealth: Payer: Self-pay | Admitting: Clinical

## 2021-10-08 NOTE — Telephone Encounter (Signed)
Integrated Behavioral Health General Follow Up Note  10/08/2021 Name: Mats Jeanlouis MRN: [id number] DOB: Oct 25, 1975 Eldra Word is a 46 y.o. year old male who sees [name], NP for primary care. LCSW was consulted to assess patient needs and assist with mental health concerns.  Interpreter: No.   Interpreter Name & Language: none  Assessment: Patient experiencing mental health concerns and other social/environmental needs.   Ongoing Intervention: CSW called patient and advised of approval for utility bill assistance through [employer]. CSW to coordinate payment of this with Engineer, building services. His leasing office has given him some time to get this utility paid and will not terminate his lease.  Patient was not able to make it to the Legal Aid clinic [place] courthouse for assistance with evicting guest from his home. CSW did send referral to Legal Aid for this on 10/03/21, but also referred patient to [facility] and [employer] for assistance as well today; provided patient with their contact information.  Review of patient status, including review of consultants reports, relevant laboratory and other test results, and collaboration with appropriate care team members and the patient's provider was performed as part of comprehensive patient evaluation and provision of services.    [name], [facility] [phone]

## 2021-10-14 ENCOUNTER — Ambulatory Visit (INDEPENDENT_AMBULATORY_CARE_PROVIDER_SITE_OTHER): Payer: Medicaid Other | Admitting: Clinical

## 2021-10-14 DIAGNOSIS — F32A Depression, unspecified: Secondary | ICD-10-CM | POA: Diagnosis not present

## 2021-10-14 NOTE — BH Specialist Note (Unsigned)
Integrated Behavioral Health Follow Up In-Person Visit  MRN: [id number] Name: Seydou Hearns  Number of [place] Clinician visits: 4- Fourth Visit  Session Start time: 8250   Session End time: 0370  Total time in minutes: 35   Types of Service: {CHL AMB TYPE OF SERVICE:[phone]}  Interpretor:{yes [id number]} Interpretor Name and Language: ***  Subjective: Sergei Delo is a 46 y.o. male accompanied by {Patient accompanied by:[phone]} Patient was referred by *** for ***. Patient reports the following symptoms/concerns: *** Duration of problem: ***; Severity of problem: {Mild/Moderate/Severe:20260}  Objective: Mood: {BHH MOOD:22306} and Affect: {BHH AFFECT:22307} Risk of harm to self or others: {CHL AMB BH Suicide Current Mental Status:21022748}  Life Context: Family and Social: *** School/Work: *** Self-Care: *** Life Changes: ***  Patient and/or Family's Strengths/Protective Factors: {CHL AMB BH PROTECTIVE FACTORS:[phone]}  Goals Addressed: Patient will:  Reduce symptoms of: {IBH Symptoms:21014056}   Increase knowledge and/or ability of: {IBH Patient Tools:21014057}   Demonstrate ability to: {IBH Goals:21014053}  Progress towards Goals: {CHL AMB BH PROGRESS TOWARDS GOALS:[phone]}  Interventions: Interventions utilized:  {IBH Interventions:21014054} Standardized Assessments completed: {IBH Screening Tools:21014051}  Patient and/or Family Response: ***  Patient Centered Plan: Patient is on the following Treatment Plan(s): *** Assessment: Patient currently experiencing ***.   Patient may benefit from ***.  Plan: Follow up with behavioral health clinician on : *** Behavioral recommendations: *** Referral(s): {IBH Referrals:21014055} "From scale of 1-10, how likely are you to follow plan?": ***  [name], LCSW

## 2021-10-16 ENCOUNTER — Other Ambulatory Visit: Payer: Self-pay

## 2021-10-16 ENCOUNTER — Ambulatory Visit (INDEPENDENT_AMBULATORY_CARE_PROVIDER_SITE_OTHER): Payer: Medicaid Other | Admitting: Nurse Practitioner

## 2021-10-16 DIAGNOSIS — I10 Essential (primary) hypertension: Secondary | ICD-10-CM

## 2021-10-17 ENCOUNTER — Telehealth: Payer: Self-pay

## 2021-10-17 NOTE — Progress Notes (Signed)
Enrollment in [facility] program

## 2021-10-20 NOTE — Telephone Encounter (Signed)
Call to patient about missing bp reading.  Patient stated that he has been busy with mother in the hospital and "other things".   Patient stated he will call support and enter BP's manually until he able to get device linked.

## 2021-10-22 ENCOUNTER — Ambulatory Visit: Payer: Self-pay | Admitting: Nurse Practitioner

## 2021-10-24 NOTE — Telephone Encounter (Signed)
No bp meds taken today, Pt stated last time is was taken was Wednesday (10/22/2021).   Patient stated that he is not out of medication and only takes it when he feels like it is high.  Pt encourage to take medications as rx'ed by provider. Pt reading today is 144/83 and I asked pt if that reading was elevated. Pt stated no and that he didn't feel like he needed his BP meds.  Informed/educated pt that reading today is hypertensive. Pt stated understanding and agreed to take medication today and will recheck BP this evening.

## 2021-10-27 ENCOUNTER — Telehealth: Payer: Self-pay | Admitting: Pharmacist

## 2021-10-27 NOTE — Telephone Encounter (Signed)
Received message that CMA spoke with patient on Friday, and he has only been taking antihypertensives if he believes his blood pressure to be high. Agree with advice given by the CMA to educate patient to take his antihypertensives daily.   Upon review, patient's lisinopril order has expired - will route to PCP to send refill.   Attempted to call patient to discuss, left voicemail for patient. He has appointment with PCP on Thursday and phone call with myself on Friday.   Catie Hedwig Morton, PharmD, [facility] [phone]

## 2021-10-27 NOTE — Telephone Encounter (Signed)
Noted, agree with documentation per CMA and advice given. Attempted to outreach patient today - see phone call dated 10/27/21.

## 2021-10-29 ENCOUNTER — Other Ambulatory Visit (HOSPITAL_COMMUNITY): Payer: Self-pay

## 2021-10-29 ENCOUNTER — Other Ambulatory Visit: Payer: Self-pay | Admitting: Nurse Practitioner

## 2021-10-29 DIAGNOSIS — I10 Essential (primary) hypertension: Secondary | ICD-10-CM

## 2021-10-29 MED ORDER — LISINOPRIL 20 MG PO TABS
20.0000 mg | ORAL_TABLET | Freq: Every day | ORAL | 3 refills | Status: DC
  Filled 2021-10-29: qty 90, 90d supply, fill #0

## 2021-10-29 NOTE — Telephone Encounter (Signed)
Prescription sent to pharmacy.

## 2021-10-30 ENCOUNTER — Encounter: Payer: Self-pay | Admitting: Nurse Practitioner

## 2021-10-30 ENCOUNTER — Ambulatory Visit (INDEPENDENT_AMBULATORY_CARE_PROVIDER_SITE_OTHER): Payer: Medicaid Other | Admitting: Nurse Practitioner

## 2021-10-30 ENCOUNTER — Telehealth: Payer: Self-pay | Admitting: Clinical

## 2021-10-30 VITALS — BP 119/79 | HR 68 | Temp 98.0°F | Ht 70.0 in | Wt 182.6 lb

## 2021-10-30 DIAGNOSIS — M545 Low back pain, unspecified: Secondary | ICD-10-CM

## 2021-10-30 DIAGNOSIS — E1165 Type 2 diabetes mellitus with hyperglycemia: Secondary | ICD-10-CM | POA: Diagnosis not present

## 2021-10-30 DIAGNOSIS — I63311 Cerebral infarction due to thrombosis of right middle cerebral artery: Secondary | ICD-10-CM | POA: Diagnosis not present

## 2021-10-30 DIAGNOSIS — G8929 Other chronic pain: Secondary | ICD-10-CM | POA: Diagnosis not present

## 2021-10-30 DIAGNOSIS — D509 Iron deficiency anemia, unspecified: Secondary | ICD-10-CM

## 2021-10-30 DIAGNOSIS — R109 Unspecified abdominal pain: Secondary | ICD-10-CM | POA: Diagnosis not present

## 2021-10-30 DIAGNOSIS — R10A2 Flank pain, left side: Secondary | ICD-10-CM

## 2021-10-30 DIAGNOSIS — I10 Essential (primary) hypertension: Secondary | ICD-10-CM

## 2021-10-30 DIAGNOSIS — E785 Hyperlipidemia, unspecified: Secondary | ICD-10-CM | POA: Diagnosis not present

## 2021-10-30 LAB — POCT GLYCOSYLATED HEMOGLOBIN (HGB A1C)
HbA1c POC (<> result, manual entry): 9.9 % (ref 4.0–5.6)
HbA1c, POC (controlled diabetic range): 9.9 % — AB (ref 0.0–7.0)
HbA1c, POC (prediabetic range): 9.9 % — AB (ref 5.7–6.4)
Hemoglobin A1C: 9.9 % — AB (ref 4.0–5.6)

## 2021-10-30 MED ORDER — ATORVASTATIN CALCIUM 40 MG PO TABS: 40.0000 mg | ORAL_TABLET | Freq: Every day | ORAL | 3 refills | Status: DC

## 2021-10-30 MED ORDER — ASPIRIN 325 MG PO TABS: 325.0000 mg | ORAL_TABLET | Freq: Every day | ORAL | 0 refills | Status: DC

## 2021-10-30 MED ORDER — LISINOPRIL 20 MG PO TABS: 20.0000 mg | ORAL_TABLET | Freq: Every day | ORAL | 3 refills | Status: DC

## 2021-10-30 MED ORDER — LANTUS SOLOSTAR 100 UNIT/ML ~~LOC~~ SOPN: 30.0000 [IU] | PEN_INJECTOR | Freq: Every day | SUBCUTANEOUS | 0 refills | Status: DC

## 2021-10-30 MED ORDER — FERROUS SULFATE 325 (65 FE) MG PO TABS: 325.0000 mg | ORAL_TABLET | Freq: Every day | ORAL | 3 refills | Status: DC

## 2021-10-30 MED ORDER — AMLODIPINE BESYLATE 10 MG PO TABS: 10.0000 mg | ORAL_TABLET | Freq: Every day | ORAL | 3 refills | Status: DC

## 2021-10-30 NOTE — Telephone Encounter (Signed)
Integrated Behavioral Health General Follow Up Note  10/30/2021 Name: Adrain Nesbit MRN: [id number] DOB: 06/16/1975 Vyom Brass is a 46 y.o. year old male who sees [name], NP for primary care. LCSW was consulted to assess patient needs and assist with mental health concerns.  Interpreter: No.   Interpreter Name & Language: none  Assessment: Patient experiencing mental health concerns and other social/environmental needs.   Ongoing Intervention: Coordinated with patient via phone today. Patient had visit with his PCP in the office. Patient has been switched to [facility] so he can utilize their delivery services. Assisted patient in setting up delivery with the pharmacy.   He has also requested home health aide and chiropractor referrals. Will coordinate with PCP and referral coordinator on this as needed. Have advised referral coordinator of chiropractors in network with patient's insurance.  Review of patient status, including review of consultants reports, relevant laboratory and other test results, and collaboration with appropriate care team members and the patient's provider was performed as part of comprehensive patient evaluation and provision of services.    [name], [facility] [phone]

## 2021-10-30 NOTE — Addendum Note (Signed)
Addended by: Deveron Furlong D on: 10/30/2021 02:10 PM   Modules accepted: Orders

## 2021-10-30 NOTE — Assessment & Plan Note (Signed)
-   amLODipine (NORVASC) 10 MG tablet; Take 1 tablet (10 mg total) by mouth daily.  Dispense: 90 tablet; Refill: 3 - lisinopril (ZESTRIL) 20 MG tablet; Take 1 tablet (20 mg total) by mouth daily.  Dispense: 90 tablet; Refill: 3  2. Chronic bilateral low back pain without sciatica  - Ambulatory referral to Chiropractic   3. Type 2 diabetes mellitus with hyperglycemia, without long-term current use of insulin (HCC)  - CBC - Comprehensive metabolic panel - HgB U9W - insulin glargine (LANTUS SOLOSTAR) 100 UNIT/ML Solostar Pen; Inject 30 Units into the skin at bedtime.  Dispense: 15 mL; Refill: 0  4. Iron deficiency anemia, unspecified iron deficiency anemia type  - ferrous sulfate 325 (65 FE) MG tablet; Take 1 tablet (325 mg total) by mouth daily with breakfast.  Dispense: 30 tablet; Refill: 3  5. Hyperlipidemia, unspecified hyperlipidemia type  - atorvastatin (LIPITOR) 40 MG tablet; Take 1 tablet (40 mg total) by mouth daily.  Dispense: 90 tablet; Refill: 3  6. Cerebrovascular accident (CVA) due to thrombosis of right middle cerebral artery ([place])  - Ambulatory referral to Home Health   Follow up:  Follow up in 3 months or sooner if needed

## 2021-10-30 NOTE — Progress Notes (Signed)
_0  ID: Frank Schneider, male    DOB: February 02, 1976, 46 y.o.   MRN: [id number]  Chief Complaint  Patient presents with   Follow-up    Referring provider: [name], NP   HPI  Frank Schneider presents for foot pain. He  has a past medical history of Blind right eye, Diabetes mellitus, History of appendicitis (11/2015), Hypertension, Left flank pain (12/2019), Priapism, Stroke ([place]) (04/2015), Tobacco use, Vision abnormalities, and Vitamin D deficiency (12/2019).   Patient presents today for follow-up visit.  Patient needs his medications switched to [facility] for delivery.  Patient is requesting a home health nurse because he has been having issues with ambulation for having multiple strokes.  We will place order for home health nurse for evaluation.  Patient also complains today of chronic low back pain.  Patient is requesting a referral to a chiropractor.  We will place referral today.   Denies f/c/s, n/v/d, hemoptysis, PND, leg swelling Denies chest pain or edema      No Known Allergies  Immunization History  Administered Date(s) Administered   Influenza,inj,Quad PF,6+ Mos 01/15/2015   PFIZER(Purple Top)SARS-COV-2 Vaccination 01/03/2020   Pneumococcal Polysaccharide-23 07/29/2011   Tdap 01/15/2015    Past Medical History:  Diagnosis Date   Blind right eye    Diabetes mellitus    Takes Metformin   History of appendicitis 11/2015   Hypertension    Left flank pain 12/2019   Priapism    Stroke ([place]) 04/2015   Tobacco use    Vision abnormalities    Vitamin D deficiency 12/2019    Tobacco History: Social History   Tobacco Use  Smoking Status Some Days   Packs/day: 1.00   Years: 8.00   Total pack years: 8.00   Types: Cigarettes  Smokeless Tobacco Never  Tobacco Comments   1 pack per day.   Ready to quit: Not Answered Counseling given: Not Answered Tobacco comments: 1 pack per day.   Outpatient Encounter Medications as of 10/30/2021   Medication Sig   acetaminophen (TYLENOL) 500 MG tablet Take 2 tablets (Total= 1,000 mg), by mouth, 2 times a day as needed.   Blood Glucose Monitoring Suppl (ACCU-CHEK GUIDE) w/Device KIT Use in the morning, at noon, in the evening, and at bedtime.   glucose blood (TRUE METRIX BLOOD GLUCOSE TEST) test strip Use as instructed   ibuprofen (ADVIL) 800 MG tablet take 1 tablet (800 mg) by mouth every 8 hours as needed for pain with food   Lancets (FREESTYLE) lancets Use as instructed   [DISCONTINUED] aspirin 325 MG tablet Take 1 tablet (325 mg total) by mouth daily.   [DISCONTINUED] ferrous sulfate 325 (65 FE) MG tablet Take 1 tablet (325 mg total) daily with breakfast by mouth.   [DISCONTINUED] insulin glargine (LANTUS SOLOSTAR) 100 UNIT/ML Solostar Pen Inject 30 Units into the skin at bedtime.   [DISCONTINUED] lisinopril (ZESTRIL) 20 MG tablet Take 1 tablet (20 mg total) by mouth daily.   amLODipine (NORVASC) 10 MG tablet Take 1 tablet (10 mg total) by mouth daily.   aspirin 325 MG tablet Take 1 tablet (325 mg total) by mouth daily.   atorvastatin (LIPITOR) 40 MG tablet Take 1 tablet (40 mg total) by mouth daily.   ferrous sulfate 325 (65 FE) MG tablet Take 1 tablet (325 mg total) by mouth daily with breakfast.   insulin glargine (LANTUS SOLOSTAR) 100 UNIT/ML Solostar Pen Inject 30 Units into the skin at bedtime.   lisinopril (ZESTRIL) 20 MG tablet Take  1 tablet (20 mg total) by mouth daily.   [DISCONTINUED] amLODipine (NORVASC) 10 MG tablet Take 1 tablet (10 mg total) by mouth daily.   [DISCONTINUED] atorvastatin (LIPITOR) 40 MG tablet Take 1 tablet (40 mg total) by mouth daily.   No facility-administered encounter medications on file as of 10/30/2021.     Review of Systems  Review of Systems  Constitutional: Negative.   HENT: Negative.    Cardiovascular: Negative.   Gastrointestinal: Negative.   Allergic/Immunologic: Negative.   Neurological: Negative.   Psychiatric/Behavioral:  Negative.         Physical Exam  BP 119/79   Pulse 68   Temp 98 F (36.7 C)   Ht _0  (1.778 m)   Wt 182 lb 9.6 oz (82.8 kg)   SpO2 98%   BMI 26.20 kg/m   Wt Readings from Last 5 Encounters:  10/30/21 182 lb 9.6 oz (82.8 kg)  09/08/21 189 lb (85.7 kg)  07/07/21 182 lb (82.6 kg)  06/16/21 185 lb 12.8 oz (84.3 kg)  10/16/20 179 lb 0.6 oz (81.2 kg)     Physical Exam Vitals and nursing note reviewed.  Constitutional:      General: He is not in acute distress.    Appearance: He is well-developed.  Cardiovascular:     Rate and Rhythm: Normal rate and regular rhythm.  Pulmonary:     Effort: Pulmonary effort is normal.     Breath sounds: Normal breath sounds.  Skin:    General: Skin is warm and dry.  Neurological:     Mental Status: He is alert and oriented to person, place, and time.        Assessment & Plan:   Essential hypertension - amLODipine (NORVASC) 10 MG tablet; Take 1 tablet (10 mg total) by mouth daily.  Dispense: 90 tablet; Refill: 3 - lisinopril (ZESTRIL) 20 MG tablet; Take 1 tablet (20 mg total) by mouth daily.  Dispense: 90 tablet; Refill: 3  2. Chronic bilateral low back pain without sciatica  - Ambulatory referral to Chiropractic   3. Type 2 diabetes mellitus with hyperglycemia, without long-term current use of insulin (HCC)  - CBC - Comprehensive metabolic panel - HgB K5G - insulin glargine (LANTUS SOLOSTAR) 100 UNIT/ML Solostar Pen; Inject 30 Units into the skin at bedtime.  Dispense: 15 mL; Refill: 0  4. Iron deficiency anemia, unspecified iron deficiency anemia type  - ferrous sulfate 325 (65 FE) MG tablet; Take 1 tablet (325 mg total) by mouth daily with breakfast.  Dispense: 30 tablet; Refill: 3  5. Hyperlipidemia, unspecified hyperlipidemia type  - atorvastatin (LIPITOR) 40 MG tablet; Take 1 tablet (40 mg total) by mouth daily.  Dispense: 90 tablet; Refill: 3  6. Cerebrovascular accident (CVA) due to thrombosis of right middle  cerebral artery ([place])  - Ambulatory referral to Home Health   Follow up:  Follow up in 3 months or sooner if needed     [name], NP 10/30/2021

## 2021-10-30 NOTE — Patient Instructions (Signed)
1. Essential hypertension  - amLODipine (NORVASC) 10 MG tablet; Take 1 tablet (10 mg total) by mouth daily.  Dispense: 90 tablet; Refill: 3 - lisinopril (ZESTRIL) 20 MG tablet; Take 1 tablet (20 mg total) by mouth daily.  Dispense: 90 tablet; Refill: 3  2. Chronic bilateral low back pain without sciatica  - Ambulatory referral to Chiropractic   3. Type 2 diabetes mellitus with hyperglycemia, without long-term current use of insulin (HCC)  - CBC - Comprehensive metabolic panel - HgB M6K - insulin glargine (LANTUS SOLOSTAR) 100 UNIT/ML Solostar Pen; Inject 30 Units into the skin at bedtime.  Dispense: 15 mL; Refill: 0  4. Iron deficiency anemia, unspecified iron deficiency anemia type  - ferrous sulfate 325 (65 FE) MG tablet; Take 1 tablet (325 mg total) by mouth daily with breakfast.  Dispense: 30 tablet; Refill: 3  5. Hyperlipidemia, unspecified hyperlipidemia type  - atorvastatin (LIPITOR) 40 MG tablet; Take 1 tablet (40 mg total) by mouth daily.  Dispense: 90 tablet; Refill: 3  6. Cerebrovascular accident (CVA) due to thrombosis of right middle cerebral artery ([place])  - Ambulatory referral to Home Health   Follow up:  Follow up in 3 months or sooner if needed

## 2021-10-31 ENCOUNTER — Other Ambulatory Visit: Payer: Self-pay | Admitting: Pharmacist

## 2021-10-31 DIAGNOSIS — Z794 Long term (current) use of insulin: Secondary | ICD-10-CM

## 2021-10-31 LAB — CBC
Hematocrit: 43.7 % (ref 37.5–51.0)
Hemoglobin: 13.3 g/dL (ref 13.0–17.7)
MCH: 19.5 pg — ABNORMAL LOW (ref 26.6–33.0)
MCHC: 30.4 g/dL — ABNORMAL LOW (ref 31.5–35.7)
MCV: 64 fL — ABNORMAL LOW (ref 79–97)
Platelets: 148 10*3/uL — ABNORMAL LOW (ref 150–450)
RBC: 6.81 x10E6/uL — ABNORMAL HIGH (ref 4.14–5.80)
RDW: 18.7 % — ABNORMAL HIGH (ref 11.6–15.4)
WBC: 12.1 10*3/uL — ABNORMAL HIGH (ref 3.4–10.8)

## 2021-10-31 LAB — COMPREHENSIVE METABOLIC PANEL
ALT: 26 IU/L (ref 0–44)
AST: 14 IU/L (ref 0–40)
Albumin/Globulin Ratio: 2.5 — ABNORMAL HIGH (ref 1.2–2.2)
Albumin: 5 g/dL (ref 4.0–5.0)
Alkaline Phosphatase: 84 IU/L (ref 44–121)
BUN/Creatinine Ratio: 17 (ref 9–20)
BUN: 19 mg/dL (ref 6–24)
Bilirubin Total: 0.6 mg/dL (ref 0.0–1.2)
CO2: 22 mmol/L (ref 20–29)
Calcium: 10.1 mg/dL (ref 8.7–10.2)
Chloride: 100 mmol/L (ref 96–106)
Creatinine, Ser: 1.1 mg/dL (ref 0.76–1.27)
Globulin, Total: 2 g/dL (ref 1.5–4.5)
Glucose: 221 mg/dL — ABNORMAL HIGH (ref 70–99)
Potassium: 5.1 mmol/L (ref 3.5–5.2)
Sodium: 137 mmol/L (ref 134–144)
Total Protein: 7 g/dL (ref 6.0–8.5)
eGFR: 84 mL/min/{1.73_m2} (ref >59)

## 2021-10-31 LAB — HEMOGLOBIN A1C
Est. average glucose Bld gHb Est-mCnc: 232 mg/dL
Hgb A1c MFr Bld: 9.7 % — ABNORMAL HIGH (ref 4.8–5.6)

## 2021-10-31 MED ORDER — METFORMIN HCL ER 500 MG PO TB24: 500.0000 mg | ORAL_TABLET | Freq: Two times a day (BID) | ORAL | 1 refills | Status: DC

## 2021-10-31 NOTE — Chronic Care Management (AMB) (Signed)
Chief Complaint  Patient presents with   Hypertension    Frank Schneider is a 46 y.o. year old male who presented for a telephone visit.   They were referred to the pharmacist by their PCP for assistance in managing hypertension.   Patient is participating in a Managed Medicaid Plan:  Yes  Subjective:  Care Team: Primary Care Provider: [name], NP ; Next Scheduled Visit: 01/30/22  Medication Access/Adherence  Current Pharmacy:  [facility] [place] [address] Phone: [phone] Fax: [place], [place] - [address] [place] [address] Phone: [phone] Fax: [phone]   Patient reports affordability concerns with their medications: No  Patient reports access/transportation concerns to their pharmacy: No  Patient reports adherence concerns with their medications:  Yes  is switching to delivery with [facility]. Discussed adherence packaging, we will discuss switching to this moving forward.    Diabetes:  Current medications: Lantus 30 units daily  Medications tried in the past: metformin IR caused diarrhea, has not tried XR.   A1c yesterday elevated at 9.9%  Hypertension:  Current medications: lisinopril 40 mg daily, amlodipine 10 mg daily - takes both in the morning  Patient has a validated, automated, upper arm home BP cuff through [employer]  Current blood pressure readings readings: 120-140s/  Patient denies hypotensive s/sx including dizziness, lightheadedness.    Previously had only been taking antihypertensives if he "felt" that his blood pressure was high   Hyperlipidemia/ASCVD Risk Reduction  Current lipid lowering medications: atorvastatin 40 mg daily  Antiplatelet regimen: aspirin 325 mg daily, hx CVA in [place] Maintenance Due  Topic Date Due   COVID-19 Vaccine (2 - Pfizer series) 02/28/2020    OPHTHALMOLOGY EXAM  08/07/2021   FOOT EXAM  10/16/2021     Objective: Lab Results  Component Value Date   HGBA1C 9.9 (A) 10/30/2021   HGBA1C 9.9 10/30/2021   HGBA1C 9.9 (A) 10/30/2021   HGBA1C 9.9 (A) 10/30/2021    Lab Results  Component Value Date   CREATININE 1.10 10/30/2021   BUN 19 10/30/2021   NA 137 10/30/2021   K 5.1 10/30/2021   CL 100 10/30/2021   CO2 22 10/30/2021    Lab Results  Component Value Date   CHOL 156 06/16/2021   HDL 36 (L) 06/16/2021   LDLCALC 85 06/16/2021   TRIG 209 (H) 06/16/2021   CHOLHDL 4.3 06/16/2021    Medications Reviewed Today     Reviewed by Osker Mason, RPH-CPP (Pharmacist) on 10/31/21 at 1324  Med List Status: <None>   Medication Order Taking? Sig Documenting Provider Last Dose Status Informant  acetaminophen (TYLENOL) 500 MG tablet [id number]  Take 2 tablets (Total= 1,000 mg), by mouth, 2 times a day as needed. Azzie Glatter, FNP  Active   amLODipine (NORVASC) 10 MG tablet [id number] Yes Take 1 tablet (10 mg total) by mouth daily. [name], NP Taking Active   aspirin 325 MG tablet [id number]  Take 1 tablet (325 mg total) by mouth daily. [name], NP  Active   atorvastatin (LIPITOR) 40 MG tablet [id number] Yes Take 1 tablet (40 mg total) by mouth daily. [name], NP Taking Active   Blood Glucose Monitoring Suppl (ACCU-CHEK GUIDE) w/Device KIT [id number]  Use in the morning, at noon, in the evening, and at bedtime. Vevelyn Francois, NP  Active   ferrous  sulfate 325 (65 FE) MG tablet [id number]  Take 1 tablet (325 mg total) by mouth daily with breakfast. [name], NP  Active   glucose blood (TRUE METRIX BLOOD GLUCOSE TEST) test strip [id number]  Use as instructed Lanae Boast, FNP  Active   ibuprofen (ADVIL) 800 MG tablet [id number] No take 1 tablet (800 mg) by mouth every 8 hours as needed for pain with food  Patient not taking: Reported on 10/31/2021   Vevelyn Francois, NP Not Taking Active   insulin  glargine (LANTUS SOLOSTAR) 100 UNIT/ML Solostar Pen [id number] Yes Inject 30 Units into the skin at bedtime. [name], NP Taking Active   Lancets (FREESTYLE) lancets [id number]  Use as instructed Reyne Dumas, MD  Active Self  lisinopril (ZESTRIL) 20 MG tablet [id number] Yes Take 1 tablet (20 mg total) by mouth daily. [name], NP Taking Active               Assessment/Plan:   Diabetes: - Currently uncontrolled - Reviewed goal A1c, goal fasting, and goal 2 hour post prandial glucose - Reviewed most recent A1c with patient.  - Discussed with PCP. Recommend restarting metformin with XR formulation and taking twice daily for improved tolerability. Continue Lantus 30 units daily.  - Recommend to check glucose twice daily  Hypertension: - Currently uncontrolled prior to taking medications in the morning - Reviewed long term cardiovascular and renal outcomes of uncontrolled blood pressure - Educated on need to take antihypertensives even if he "feels" fine.  - Reviewed appropriate blood pressure monitoring technique and reviewed goal blood pressure. Recommended to check home blood pressure and heart rate twice daily using [employer] system - Recommend to move amlodipine to evening administration to better cover throughout the day. Continue lisinopril in the morning  Hyperlipidemia/ASCVD Risk Reduction: - Currently uncontrolled given hx ASCVD and LDL goal <70 - Will focus first on adherence. Will assist patient in pursuing adherence packaging through [employer].  - Continue current regimen at this time  Follow Up Plan: phone call in 2 weeks  Catie TJodi Mourning, PharmD, [facility] [phone]

## 2021-10-31 NOTE — Patient Instructions (Signed)
Markas,   It was great taking to you today!  Here's how I would take your medications:  Morning: - Lisinopril (blood pressure) - Atorvastatin (cholesterol)  - Aspirin (stroke prevention) - Metformin XR (diabetes)  Evening: - Amlodipine (blood pressure) - Metformin XR (diabetes)  Lantus 30 units  Let me know if you have any questions or concerns!  Catie Hedwig Morton, PharmD, [facility] [phone]

## 2021-11-03 ENCOUNTER — Other Ambulatory Visit (HOSPITAL_COMMUNITY): Payer: Self-pay

## 2021-11-04 ENCOUNTER — Other Ambulatory Visit: Payer: Self-pay | Admitting: Internal Medicine

## 2021-11-04 DIAGNOSIS — R10A2 Flank pain, left side: Secondary | ICD-10-CM

## 2021-11-04 DIAGNOSIS — G8929 Other chronic pain: Secondary | ICD-10-CM

## 2021-11-04 MED ORDER — ACETAMINOPHEN 500 MG PO TABS: ORAL_TABLET | ORAL | 3 refills | Status: AC

## 2021-11-05 NOTE — Progress Notes (Signed)
Unable to reach patient results was mailed to patient

## 2021-11-06 ENCOUNTER — Telehealth: Payer: Self-pay

## 2021-11-06 NOTE — Telephone Encounter (Signed)
Tried to call patient but answering service stated that patient is not accepting calls at this time and not able to lvm.   Will send patient mychart message to gather more information about medication questions as entered in the [facility] portal.

## 2021-11-11 ENCOUNTER — Telehealth: Payer: Self-pay | Admitting: Clinical

## 2021-11-11 NOTE — Telephone Encounter (Signed)
Integrated Behavioral Health General Follow Up Note  11/11/2021 Name: Frank Schneider MRN: [id number] DOB: August 03, 1975 Frank Schneider is a 46 y.o. year old male who sees [name], NP for primary care. LCSW was consulted to assess patient needs and assist with mental health concerns.  Interpreter: No.   Interpreter Name & Language: none  Assessment: Patient experiencing mental health concerns and other social/environmental needs.   Ongoing Intervention: CSW called patient to follow up, as patient needed to reschedule his podiatry appointment. Assisted patient in calling podiatry office and rescheduled for 7/27. CSW arranged transportation to this appointment with [facility].  Review of patient status, including review of consultants reports, relevant laboratory and other test results, and collaboration with appropriate care team members and the patient's provider was performed as part of comprehensive patient evaluation and provision of services.    [name], [facility] [phone]

## 2021-11-12 ENCOUNTER — Telehealth: Payer: Self-pay

## 2021-11-12 NOTE — Telephone Encounter (Signed)
Attempted call to patient due to lack of engagement with the [facility].   I was unable to leave a vm.

## 2021-11-18 ENCOUNTER — Telehealth: Payer: Self-pay | Admitting: Pharmacist

## 2021-11-18 ENCOUNTER — Ambulatory Visit: Payer: Self-pay

## 2021-11-18 NOTE — Telephone Encounter (Signed)
Attempted to contact patient for [employer] hypertension follow up. Unable to leave voicemail on any listed numbers.   Have sent message via MyChart and [employer] app.   Catie Hedwig Morton, PharmD, [facility] [phone]

## 2021-11-19 DIAGNOSIS — I10 Essential (primary) hypertension: Secondary | ICD-10-CM | POA: Diagnosis not present

## 2021-11-20 NOTE — Telephone Encounter (Addendum)
Can see in the [employer] portal that patient viewed my message on 7/36/23 at 5:28 pm. Requested that he call me at his convenience.   Attempted to call patient. Unable to leave voicemail. Sent another message asking if patient is having phone problems.

## 2021-11-24 DIAGNOSIS — I10 Essential (primary) hypertension: Secondary | ICD-10-CM | POA: Diagnosis not present

## 2021-11-25 ENCOUNTER — Other Ambulatory Visit: Payer: Self-pay

## 2021-11-25 NOTE — Telephone Encounter (Signed)
Spoke to patient today and he is requesting a refill of ibuprofen 800 mg.   I pended the medication to this encounter but was not able to refill due to 3 different drug to drug interactions.   Please advise if refill is appropriate.   Pharmacy patient would like it sent to is [facility]. (Attached to the order below).

## 2021-11-25 NOTE — Telephone Encounter (Signed)
Spoke to patient and he stated that he has been staying with his girlfriend and has not had his BP machine with him.   Patient stated that he will get the BP monitor from his mothers house and start checking.   Patient stated that he has been taking his BP has prescribed.

## 2021-11-26 MED ORDER — IBUPROFEN 800 MG PO TABS: ORAL_TABLET | ORAL | 0 refills | Status: DC

## 2021-11-28 ENCOUNTER — Telehealth: Payer: Self-pay | Admitting: Clinical

## 2021-11-28 NOTE — Telephone Encounter (Signed)
Integrated Behavioral Health General Follow Up Note  11/28/2021 Name: Frank Schneider MRN: [id number] DOB: 1976/01/22 Frank Schneider is a 46 y.o. year old male who sees [name], NP for primary care. LCSW was consulted to assess patient needs and assist with mental health concerns.  Interpreter: No.   Interpreter Name & Language: none  Assessment: Patient experiencing mental health concerns and other social/environmental needs.   Ongoing Intervention: Patient called CSW and reported he had rescheduled his podiatry appointment for today and had done this while CSW was out of office 7/24 - 8/2. Transportation was evidently not scheduled to this new appointment. Assisted patient in rescheduling his podiatry appointment again and scheduled new ride with [facility].  Review of patient status, including review of consultants reports, relevant laboratory and other test results, and collaboration with appropriate care team members and the patient's provider was performed as part of comprehensive patient evaluation and provision of services.    [name], [facility] [phone]

## 2021-12-05 ENCOUNTER — Telehealth: Payer: Self-pay

## 2021-12-05 NOTE — Telephone Encounter (Signed)
Patient has not been engaged with the [facility] since July 15th.   LVM for pt to call back as soon as possible.

## 2021-12-08 NOTE — Telephone Encounter (Signed)
LVM for pt to call back as soon as possible.   

## 2021-12-10 DIAGNOSIS — E1142 Type 2 diabetes mellitus with diabetic polyneuropathy: Secondary | ICD-10-CM | POA: Diagnosis not present

## 2021-12-10 DIAGNOSIS — M792 Neuralgia and neuritis, unspecified: Secondary | ICD-10-CM | POA: Diagnosis not present

## 2021-12-10 DIAGNOSIS — L84 Corns and callosities: Secondary | ICD-10-CM | POA: Diagnosis not present

## 2021-12-10 DIAGNOSIS — B351 Tinea unguium: Secondary | ICD-10-CM | POA: Diagnosis not present

## 2021-12-10 DIAGNOSIS — M2042 Other hammer toe(s) (acquired), left foot: Secondary | ICD-10-CM | POA: Diagnosis not present

## 2021-12-10 DIAGNOSIS — L603 Nail dystrophy: Secondary | ICD-10-CM | POA: Diagnosis not present

## 2021-12-10 DIAGNOSIS — I739 Peripheral vascular disease, unspecified: Secondary | ICD-10-CM | POA: Diagnosis not present

## 2021-12-10 DIAGNOSIS — M2041 Other hammer toe(s) (acquired), right foot: Secondary | ICD-10-CM | POA: Diagnosis not present

## 2021-12-23 ENCOUNTER — Telehealth: Payer: Self-pay | Admitting: Clinical

## 2021-12-23 NOTE — Telephone Encounter (Signed)
Integrated Behavioral Health General Follow Up Note  12/23/2021 Name: [name] MRN: [id number] DOB: 03-Dec-1975 Frank Schneider is a 46 y.o. year old male who sees [name], NP for primary care. LCSW was consulted to assess patient needs and assist with mental health concerns.  Interpreter: No.   Interpreter Name & Language: none  Assessment: Patient experiencing mental health concerns and other social/environmental needs.   Ongoing Intervention: Patient called CSW and expressed being upset that he hadn't been connected with an agency for personal care aide. Advised patient that he was previously referred for this, but the referrals were declined due to agencies' lack of staffing. He reported that he has several friends who work for home care agencies so he doesn't understand why agencies would say they are understaffed.   Patient feels "everyone has given up on me" and was quite upset during the call. Provided supportive counseling including emotional validation and reflective listening. Advised that CSW has tried to call patient several times this month but was unable to reach him. Patient stated he does want to talk in person. Scheduled follow up [name] appointment for tomorrow.   Patient additionally in need of eye doctor referral. CSW to coordinate with PCP and referral coordinator on personal care aide and eye referral.   Review of patient status, including review of consultants reports, relevant laboratory and other test results, and collaboration with appropriate care team members and the patient's provider was performed as part of comprehensive patient evaluation and provision of services.    [name], [facility] [phone]

## 2021-12-24 ENCOUNTER — Telehealth: Payer: Self-pay | Admitting: Clinical

## 2021-12-24 ENCOUNTER — Emergency Department (HOSPITAL_COMMUNITY)
Admission: EM | Admit: 2021-12-24 | Discharge: 2021-12-24 | Disposition: A | Discharge status: Home / Self Care | Payer: Medicaid Other | Attending: Emergency Medicine | Admitting: Emergency Medicine

## 2021-12-24 ENCOUNTER — Other Ambulatory Visit: Payer: Self-pay

## 2021-12-24 ENCOUNTER — Encounter (HOSPITAL_COMMUNITY): Payer: Self-pay | Admitting: Emergency Medicine

## 2021-12-24 ENCOUNTER — Ambulatory Visit: Payer: Medicaid Other | Admitting: Clinical

## 2021-12-24 DIAGNOSIS — H5789 Other specified disorders of eye and adnexa: Secondary | ICD-10-CM | POA: Diagnosis not present

## 2021-12-24 DIAGNOSIS — E119 Type 2 diabetes mellitus without complications: Secondary | ICD-10-CM | POA: Diagnosis not present

## 2021-12-24 DIAGNOSIS — Z79899 Other long term (current) drug therapy: Secondary | ICD-10-CM | POA: Insufficient documentation

## 2021-12-24 DIAGNOSIS — I10 Essential (primary) hypertension: Secondary | ICD-10-CM | POA: Insufficient documentation

## 2021-12-24 DIAGNOSIS — Z794 Long term (current) use of insulin: Secondary | ICD-10-CM | POA: Diagnosis not present

## 2021-12-24 DIAGNOSIS — H5440 Blindness, one eye, unspecified eye: Secondary | ICD-10-CM | POA: Diagnosis not present

## 2021-12-24 DIAGNOSIS — Z7982 Long term (current) use of aspirin: Secondary | ICD-10-CM | POA: Insufficient documentation

## 2021-12-24 DIAGNOSIS — H579 Unspecified disorder of eye and adnexa: Secondary | ICD-10-CM

## 2021-12-24 DIAGNOSIS — Z7984 Long term (current) use of oral hypoglycemic drugs: Secondary | ICD-10-CM | POA: Insufficient documentation

## 2021-12-24 MED ORDER — TETRACAINE HCL 0.5 % OP SOLN
1.0000 [drp] | Freq: Once | OPHTHALMIC | Status: AC
  Administered 2021-12-24: 1 [drp] via OPHTHALMIC
  Filled 2021-12-24: qty 4

## 2021-12-24 MED ORDER — FLUORESCEIN SODIUM 1 MG OP STRP
1.0000 | ORAL_STRIP | Freq: Once | OPHTHALMIC | Status: AC
  Administered 2021-12-24: 1 via OPHTHALMIC
  Filled 2021-12-24: qty 1

## 2021-12-24 NOTE — ED Triage Notes (Signed)
Pt reports pain to his right eye X2 weeks.  He reports that he thinks the lens that was surgically placed has moved.

## 2021-12-24 NOTE — ED Provider Notes (Signed)
Received signout from previous provider, please see her note for complete H&P.  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  On exam, conjunctiva is injected and patient does have a U-shaped dendritic lesion noted on his lens but no evidence of foreign body.  There are no concerning herpetic lesion noted to his face orbital region.  Plan to consult ophthalmologist for recommendation.  8:15 AM I have consulted DR. Hollice Espy who request pt to come to his office today for further eval.  Pt made aware and agrees with plan.      BP (!) 176/80   Pulse (!) 51   Temp 98 F (36.7 C) (Oral)   Resp 16   Ht '5\' 11"'$  (1.803 m)   Wt 81.6 kg   SpO2 99%   BMI 25.10 kg/m     Domenic Moras, PA-C 12/24/21 0820    Kommor, Debe Coder, MD 12/24/21 1052

## 2021-12-24 NOTE — Discharge Instructions (Signed)
You have been evaluated for your eye irritation.  There is a dendritic lesion in your right eye concerning for potential infection.  Please go straight to the ophthalmology office on [facility] after being discharged for further evaluation

## 2021-12-24 NOTE — Telephone Encounter (Signed)
Integrated Behavioral Health General Follow Up Note  12/24/2021 Name: Frank Schneider MRN: [id number] DOB: 1976/01/03 Frank Schneider is a 46 y.o. year old male who sees [name], NP for primary care. LCSW was consulted to assess patient needs and assist with mental health concerns.  Interpreter: No.   Interpreter Name & Language: none  Assessment: Patient experiencing mental health concerns and other social/environmental needs.   Ongoing Intervention: CSW called patient to confirm [place] ([facility]) appointment for today. Patient reported he had gone to the ER for his eye problems this morning and would be seen at eye doctor's office this morning as well. He wanted to cancel [facility] appointment. CSW will continue to follow for support.   Review of patient status, including review of consultants reports, relevant laboratory and other test results, and collaboration with appropriate care team members and the patient's provider was performed as part of comprehensive patient evaluation and provision of services.    [name], [facility] [phone]

## 2021-12-24 NOTE — ED Provider Notes (Signed)
[facility] EMERGENCY DEPARTMENT Provider Note   CSN: [id number] Arrival date & time: 12/24/21  0441     History  Chief Complaint  Patient presents with   Eye Problem    Frank Schneider is a 46 y.o. male who presents with concern for foreign body sensation and watery discharge from the right eye x2 weeks.  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  Patient does not even since Light in the right eye.  Denies any discomfort in the left eye.  Denies any facial swelling, redness.  No fevers or chills.  I personally reviewed patient's medical records previous history of type 2 diabetes, CVA, hypertension, anxiety depression.   HPI     Home Medications Prior to Admission medications   Medication Sig Start Date End Date Taking? Authorizing Provider  acetaminophen (TYLENOL) 500 MG tablet Take 2 tablets (Total= 1,000 mg), by mouth, 2 times a day as needed. Patient taking differently: Take 1,000 mg by mouth 2 (two) times daily as needed for moderate pain. 11/04/21  Yes Frank Schneider  amLODipine (NORVASC) 10 MG tablet Take 1 tablet (10 mg total) by mouth daily. 10/30/21 10/30/22 Yes Frank Foy, NP  aspirin 325 MG tablet Take 1 tablet (325 mg total) by mouth daily. 10/30/21  Yes Frank Foy, NP  atorvastatin (LIPITOR) 40 MG tablet Take 1 tablet (40 mg total) by mouth daily. 10/30/21 10/30/22 Yes Frank Foy, NP  ferrous sulfate 325 (65 FE) MG tablet Take 1 tablet (325 mg total) by mouth daily with breakfast. 10/30/21  Yes Frank Foy, NP  ibuprofen (ADVIL) 800 MG tablet take 1 tablet (800 mg) by mouth every 8 hours as needed for pain with food 11/26/21  Yes Frank Foy, NP  insulin glargine (LANTUS SOLOSTAR) 100 UNIT/ML Solostar Pen Inject 30 Units into the skin at bedtime. 10/30/21  Yes Frank Foy, NP  lisinopril (ZESTRIL) 20 MG tablet Take 1 tablet (20 mg total) by mouth daily. 10/30/21 10/30/22 Yes Frank Foy, NP  metFORMIN (GLUCOPHAGE-XR) 500 MG 24 hr tablet Take 1 tablet (500 mg total) by mouth 2 (two) times daily. 10/31/21  Yes Frank Foy, NP  Blood Glucose Monitoring Suppl (ACCU-CHEK GUIDE) w/Device KIT Use in the morning, at noon, in the evening, and at bedtime. 10/16/20   Frank Francois, NP  glucose blood (TRUE METRIX BLOOD GLUCOSE TEST) test strip Use as instructed 07/20/18   Frank Boast, Frank Schneider  Lancets (FREESTYLE) lancets Use as instructed 01/03/15   Frank Dumas, Schneider      Allergies    Patient has no known allergies.    Review of Systems   Review of Systems  Eyes:  Positive for pain, discharge and redness. Negative for photophobia and visual disturbance.    Physical Exam Updated Vital Signs BP (!) 166/76   Pulse (!) 51   Temp 98.8 F (37.1 C) (Oral)   Resp 11   Ht 5' 11"  (1.803 m)   Wt 81.6 kg   SpO2 99%   BMI 25.10 kg/m  Physical Exam Vitals and nursing note reviewed.  Constitutional:      Appearance: He is not ill-appearing or toxic-appearing.  HENT:     Head: Normocephalic and atraumatic.  Eyes:     General: Lids are normal. Lids are everted, no foreign bodies appreciated. Gaze aligned appropriately. No scleral icterus.       Right eye: Discharge present.  Left eye: No discharge.     Extraocular Movements: Extraocular movements intact.     Conjunctiva/sclera:     Right eye: Right conjunctiva is injected.     Pupils:     Right eye: Fluorescein uptake present. Seidel exam negative.     Left eye: Pupil is round and reactive. No corneal abrasion.     Slit lamp exam:    Right eye: No foreign body.      Comments: Watery discharge from the right eye. Clouding of the cornea, per patient baseline from prior trauma and blindness.  No periorbital erythema, induration, skin lesions, No otic skin lesions.  Cardiovascular:     Heart sounds: Normal heart sounds. No murmur heard. Pulmonary:     Effort: Pulmonary effort is normal.     Breath sounds: Normal  breath sounds.  Skin:    General: Skin is warm and dry.  Neurological:     General: No focal deficit present.     Mental Status: He is alert.  Psychiatric:        Mood and Affect: Mood normal.        ED Results / Procedures / Treatments   Labs (all labs ordered are listed, but only abnormal results are displayed) Labs Reviewed - No data to display  EKG None  Radiology No results found.  Procedures Procedures    Medications Ordered in ED Medications  fluorescein ophthalmic strip 1 strip (1 strip Right Eye Given by Other 12/24/21 0653)  tetracaine (PONTOCAINE) 0.5 % ophthalmic solution 1 drop (1 drop Right Eye Given by Other 12/24/21 3354)    ED Course/ Medical Decision Making/ A&P                           Medical Decision Making 27-year male present with concern for foreign body sensation and watering of the right eye x1 week.  Hypertensive on intake, vitals otherwise normal.  No periorbital cellulitis, skin changes, or lesions.  EOMI.  Left pupil is round reactive to light.  Right eye findings as above with fluorescein exam concerning for dendritic lesion.  Differential diagnosis includes limited to corneal abrasion, herpes zoster ophthalmicus, epidemic keratoconjunctivitis,   Risk Prescription drug management.   Care of this patient signed out to oncoming ED provider Domenic Moras, PA-C at time of shift change.  All pertinent HPI and physical exam, laboratory findings were discussed from prior to my partner.  Disposition to be determined after pending ophthalmologic consultation.  [name] voiced understanding of his medical evaluation and treatment plan thus far. Each of their questions answered to their expressed satisfaction.  This chart was dictated using voice recognition software, Dragon. Despite the best efforts of this provider to proofread and correct errors, errors may still occur which can change documentation meaning.     Final Clinical Impression(s) / ED  Diagnoses Final diagnoses:  None    Rx / DC Orders ED Discharge Orders     None         Aura Dials 12/24/21 5625    Ripley Fraise, Schneider 12/24/21 [phone]

## 2021-12-25 NOTE — Telephone Encounter (Signed)
Could you please check on [facility] referral and give patient list of eye doctors.

## 2022-01-05 ENCOUNTER — Telehealth: Payer: Self-pay | Admitting: Nurse Practitioner

## 2022-01-05 NOTE — Telephone Encounter (Signed)
Pharmacy called stating a prescription for pen needles so he is able to take his insulin  Sent to [facility]

## 2022-01-06 ENCOUNTER — Telehealth: Payer: Self-pay | Admitting: Nurse Practitioner

## 2022-01-06 ENCOUNTER — Other Ambulatory Visit: Payer: Self-pay | Admitting: Family Medicine

## 2022-01-06 NOTE — Telephone Encounter (Signed)
Pt is out of needles for insulin. Didn't realize that the pharmacy wouldn't fill them when he filled insulin.  Please send to prescription for needles to his pharmacy.

## 2022-01-08 ENCOUNTER — Other Ambulatory Visit: Payer: Self-pay | Admitting: Nurse Practitioner

## 2022-01-08 ENCOUNTER — Telehealth: Payer: Self-pay

## 2022-01-08 MED ORDER — INSULIN PEN NEEDLE 30G X 8 MM MISC
1.0000 | 0 refills | Status: DC | PRN
Start: 2022-01-08 — End: 2022-01-09

## 2022-01-08 NOTE — Telephone Encounter (Signed)
Needles for insulin

## 2022-01-09 ENCOUNTER — Other Ambulatory Visit (HOSPITAL_COMMUNITY): Payer: Self-pay

## 2022-01-09 ENCOUNTER — Other Ambulatory Visit: Payer: Self-pay | Admitting: Nurse Practitioner

## 2022-01-09 MED ORDER — INSULIN PEN NEEDLE 30G X 8 MM MISC
1.0000 | 0 refills | Status: DC | PRN
  Filled 2022-01-09: qty 100, 90d supply, fill #0

## 2022-01-09 NOTE — Telephone Encounter (Signed)
These have already been sent to the pharmacy twice

## 2022-01-24 ENCOUNTER — Other Ambulatory Visit (HOSPITAL_COMMUNITY): Payer: Self-pay

## 2022-01-30 ENCOUNTER — Encounter: Payer: Self-pay | Admitting: Nurse Practitioner

## 2022-01-30 ENCOUNTER — Ambulatory Visit (INDEPENDENT_AMBULATORY_CARE_PROVIDER_SITE_OTHER): Payer: Medicaid Other | Admitting: Nurse Practitioner

## 2022-01-30 VITALS — BP 148/78 | HR 64 | Temp 98.1°F | Ht 70.0 in | Wt 188.0 lb

## 2022-01-30 DIAGNOSIS — E1165 Type 2 diabetes mellitus with hyperglycemia: Secondary | ICD-10-CM | POA: Diagnosis not present

## 2022-01-30 DIAGNOSIS — M545 Low back pain, unspecified: Secondary | ICD-10-CM | POA: Diagnosis not present

## 2022-01-30 DIAGNOSIS — G8929 Other chronic pain: Secondary | ICD-10-CM | POA: Diagnosis not present

## 2022-01-30 LAB — POCT GLYCOSYLATED HEMOGLOBIN (HGB A1C)
HbA1c POC (<> result, manual entry): 8.7 % (ref 4.0–5.6)
HbA1c, POC (controlled diabetic range): 8.7 % — AB (ref 0.0–7.0)
HbA1c, POC (prediabetic range): 8.7 % — AB (ref 5.7–6.4)
Hemoglobin A1C: 8.7 % — AB (ref 4.0–5.6)

## 2022-01-30 MED ORDER — IBUPROFEN 800 MG PO TABS: ORAL_TABLET | ORAL | 0 refills | Status: DC

## 2022-01-30 MED ORDER — LANTUS SOLOSTAR 100 UNIT/ML ~~LOC~~ SOPN: 30.0000 [IU] | PEN_INJECTOR | Freq: Every day | SUBCUTANEOUS | 0 refills | Status: DC

## 2022-01-30 NOTE — Assessment & Plan Note (Signed)
-   POCT glycosylated hemoglobin (Hb A1C)  2. Chronic bilateral low back pain without sciatica  - ibuprofen (ADVIL) 800 MG tablet; take 1 tablet (800 mg) by mouth every 8 hours as needed for pain with food  Dispense: 30 tablet; Refill: 0 - [facility] Complete  AVS was discussed with patient. Patient was asked if there were any other questions or concerns today. Patient stated there were no other questions or concerns. Patient was advised that they needed lab work and then could check out and schedule follow up visit. Patient voiced understanding. Appointment was completed.    Patient states that all issues and concerns were addressed during visit today.      Follow up:  Follow up in 3 months will be switching to [facility] because it is closer to home - Dr. Redmond Pulling

## 2022-01-30 NOTE — Patient Instructions (Addendum)
1. Type 2 diabetes mellitus with hyperglycemia, without long-term current use of insulin (HCC)  - POCT glycosylated hemoglobin (Hb A1C)  2. Chronic bilateral low back pain without sciatica  - ibuprofen (ADVIL) 800 MG tablet; take 1 tablet (800 mg) by mouth every 8 hours as needed for pain with food  Dispense: 30 tablet; Refill: 0 - [facility] Complete  AVS was discussed with patient. Patient was asked if there were any other questions or concerns today. Patient stated there were no other questions or concerns. Patient was advised that they needed lab work and then could check out and schedule follow up visit. Patient voiced understanding. Appointment was completed.    Patient states that all issues and concerns were addressed during visit today.      Follow up:  Follow up in 3 months will be switching to [facility] because it is closer to home - Dr. Redmond Pulling

## 2022-01-30 NOTE — Progress Notes (Signed)
@Patient  ID: Frank Schneider, male    DOB: 09-11-1975, 46 y.o.   MRN: [id number]  Chief Complaint  Patient presents with   Follow-up    Pt is here for 3 month f/u DM Pt is also complaining of lower back pain, requesting MRI or pain medication for same     Referring provider: [name], NP   HPI  Frank Schneider presents for foot pain. He  has a past medical history of Blind right eye, Diabetes mellitus, History of appendicitis (11/2015), Hypertension, Left flank pain (12/2019), Priapism, Stroke ([place]) (04/2015), Tobacco use, Vision abnormalities, and Vitamin D deficiency (12/2019).    Patient presents today for diabetic follow-up.  His A1c has improved but is still elevated at 8.6 today.  Patient is compliant with medications.  Patient still complains of low back pain.  He was referred to a chiropractor at his last visit for his request but was not contacted for appointment.  We will try to get him an appointment scheduled today.  Patient would like to switch care to [facility] office due to location. We will give him the number there to schedule an appointment. Denies f/c/s, n/v/d, hemoptysis, PND, leg swelling Denies chest pain or edema     No Known Allergies  Immunization History  Administered Date(s) Administered   Influenza,inj,Quad PF,6+ Mos 01/15/2015   PFIZER(Purple Top)SARS-COV-2 Vaccination 01/03/2020   Pneumococcal Polysaccharide-23 07/29/2011   Tdap 01/15/2015    Past Medical History:  Diagnosis Date   Blind right eye    Diabetes mellitus    Takes Metformin   History of appendicitis 11/2015   Hypertension    Left flank pain 12/2019   Priapism    Stroke ([place]) 04/2015   Tobacco use    Vision abnormalities    Vitamin D deficiency 12/2019    Tobacco History: Social History   Tobacco Use  Smoking Status Some Days   Packs/day: 1.00   Years: 8.00   Total pack years: 8.00   Types: Cigarettes  Smokeless Tobacco Never  Tobacco Comments   1 pack per day.    Ready to quit: Not Answered Counseling given: Not Answered Tobacco comments: 1 pack per day.   Outpatient Encounter Medications as of 01/30/2022  Medication Sig   acetaminophen (TYLENOL) 500 MG tablet Take 2 tablets (Total= 1,000 mg), by mouth, 2 times a day as needed. (Patient taking differently: Take 1,000 mg by mouth 2 (two) times daily as needed for moderate pain.)   amLODipine (NORVASC) 10 MG tablet Take 1 tablet (10 mg total) by mouth daily.   aspirin 325 MG tablet Take 1 tablet (325 mg total) by mouth daily.   atorvastatin (LIPITOR) 40 MG tablet Take 1 tablet (40 mg total) by mouth daily.   Blood Glucose Monitoring Suppl (ACCU-CHEK GUIDE) w/Device KIT Use in the morning, at noon, in the evening, and at bedtime.   ferrous sulfate 325 (65 FE) MG tablet Take 1 tablet (325 mg total) by mouth daily with breakfast.   glucose blood (TRUE METRIX BLOOD GLUCOSE TEST) test strip Use as instructed   Insulin Pen Needle (NOVOFINE) 30G X 8 MM MISC Use to inject into the skin as needed as directed   Lancets (FREESTYLE) lancets Use as instructed   lisinopril (ZESTRIL) 20 MG tablet Take 1 tablet (20 mg total) by mouth daily.   metFORMIN (GLUCOPHAGE-XR) 500 MG 24 hr tablet Take 1 tablet (500 mg total) by mouth 2 (two) times daily.   [DISCONTINUED] ibuprofen (ADVIL) 800 MG tablet  take 1 tablet (800 mg) by mouth every 8 hours as needed for pain with food   [DISCONTINUED] insulin glargine (LANTUS SOLOSTAR) 100 UNIT/ML Solostar Pen Inject 30 Units into the skin at bedtime.   ibuprofen (ADVIL) 800 MG tablet take 1 tablet (800 mg) by mouth every 8 hours as needed for pain with food   insulin glargine (LANTUS SOLOSTAR) 100 UNIT/ML Solostar Pen Inject 30 Units into the skin at bedtime.   No facility-administered encounter medications on file as of 01/30/2022.     Review of Systems  Review of Systems  Constitutional: Negative.   HENT: Negative.    Cardiovascular: Negative.   Gastrointestinal: Negative.    Allergic/Immunologic: Negative.   Neurological: Negative.   Psychiatric/Behavioral: Negative.         Physical Exam  BP (!) 148/78 (BP Location: Left Arm, Patient Position: Sitting, Cuff Size: Normal)   Pulse 64   Temp 98.1 F (36.7 C)   Ht 5' 10"  (1.778 m)   Wt 188 lb (85.3 kg)   SpO2 100%   BMI 26.98 kg/m   Wt Readings from Last 5 Encounters:  01/30/22 188 lb (85.3 kg)  12/24/21 180 lb (81.6 kg)  10/30/21 182 lb 9.6 oz (82.8 kg)  09/08/21 189 lb (85.7 kg)  07/07/21 182 lb (82.6 kg)     Physical Exam Vitals and nursing note reviewed.  Constitutional:      General: He is not in acute distress.    Appearance: He is well-developed.  Cardiovascular:     Rate and Rhythm: Normal rate and regular rhythm.  Pulmonary:     Effort: Pulmonary effort is normal.     Breath sounds: Normal breath sounds.  Skin:    General: Skin is warm and dry.  Neurological:     Mental Status: He is alert and oriented to person, place, and time.      Lab Results:  CBC    Component Value Date/Time   WBC 12.1 (H) 10/30/2021 1406   WBC 11.5 (H) 01/21/2020 1650   RBC 6.81 (H) 10/30/2021 1406   RBC 6.42 (H) 01/21/2020 1650   HGB 13.3 10/30/2021 1406   HCT 43.7 10/30/2021 1406   PLT 148 (L) 10/30/2021 1406   MCV 64 (L) 10/30/2021 1406   MCH 19.5 (L) 10/30/2021 1406   MCH 19.9 (L) 01/21/2020 1650   MCHC 30.4 (L) 10/30/2021 1406   MCHC 31.3 01/21/2020 1650   RDW 18.7 (H) 10/30/2021 1406   LYMPHSABS 3.7 (H) 06/16/2021 1435   MONOABS 0.7 09/18/2018 1430   EOSABS 0.3 06/16/2021 1435   BASOSABS 0.1 06/16/2021 1435    BMET    Component Value Date/Time   NA 137 10/30/2021 1406   K 5.1 10/30/2021 1406   CL 100 10/30/2021 1406   CO2 22 10/30/2021 1406   GLUCOSE 221 (H) 10/30/2021 1406   GLUCOSE 69 (L) 01/21/2020 1650   BUN 19 10/30/2021 1406   CREATININE 1.10 10/30/2021 1406   CREATININE 1.02 03/12/2017 1226   CALCIUM 10.1 10/30/2021 1406   GFRNONAA >60 01/21/2020 1650    GFRNONAA 91 03/12/2017 1226   GFRAA >60 01/21/2020 1650   GFRAA 105 03/12/2017 1226      Assessment & Plan:   Type 2 diabetes mellitus with hyperglycemia, without long-term current use of insulin (HCC) - POCT glycosylated hemoglobin (Hb A1C)  2. Chronic bilateral low back pain without sciatica  - ibuprofen (ADVIL) 800 MG tablet; take 1 tablet (800 mg) by mouth every 8 hours  as needed for pain with food  Dispense: 30 tablet; Refill: 0 - DG Lumbar Spine Complete  AVS was discussed with patient. Patient was asked if there were any other questions or concerns today. Patient stated there were no other questions or concerns. Patient was advised that they needed lab work and then could check out and schedule follow up visit. Patient voiced understanding. Appointment was completed.    Patient states that all issues and concerns were addressed during visit today.      Follow up:  Follow up in 3 months will be switching to [facility] because it is closer to home - Dr. Kennith Gain, NP 01/30/2022

## 2022-02-12 ENCOUNTER — Telehealth: Payer: Self-pay

## 2022-02-12 NOTE — Telephone Encounter (Signed)
Called and spoke to patient.   Patient stated that he has been feeling better. Patient stated that he will bring the bp cuff back to the office when he comes in for this xray.   Praised patient for his improvement in his A1c. Patient states that he is taking his bp meds every day and is not having any issues.

## 2022-02-24 ENCOUNTER — Telehealth: Payer: Self-pay | Admitting: Clinical

## 2022-02-24 NOTE — Telephone Encounter (Signed)
Integrated [facility]al Health General Follow Up Note  02/24/2022 Name: Ewel Lona MRN: [id number] DOB: [birth year] Ameet Sandy is a 46 y.o. year old male who sees [name], NP for primary care. LCSW was consulted to assess patient needs and assist with mental health concerns.  Interpreter: No.   Interpreter Name & Language: none  Assessment: Patient experiencing mental health concerns and other social/environmental needs.   Ongoing Intervention: CSW called patient to follow up on [facility] referral. Patient reported he has not yet received paperwork from [facility]. CSW re-sent referral and requested if paperwork can be mailed to Patient [facility]) office. Patient also indicated his PCP ordered an x-ray for his back but he has not yet gone to have this done. CSW to follow and assist with transportation.  Review of patient status, including review of consultants reports, relevant laboratory and other test results, and collaboration with appropriate care team members and the patient's provider was performed as part of comprehensive patient evaluation and provision of services.    [name], [facility] [phone]

## 2022-02-25 ENCOUNTER — Telehealth: Payer: Self-pay | Admitting: Clinical

## 2022-02-26 NOTE — Telephone Encounter (Addendum)
Integrated Behavioral Health General Follow Up Note  02/25/22 Name: Frank Schneider MRN: [id number] DOB: Jun 08, 1975 Frank Schneider is a 46 y.o. year old male who sees [name], NP for primary care. LCSW was consulted to assess patient needs and assist with mental health concerns.  Interpreter: No.   Interpreter Name & Language: none  Assessment: Patient experiencing mental health concerns and other social/environmental needs.   Ongoing Intervention: Patient reported his PCP ordered an x-ray for his back but he has not yet gone to have this done. Confirmed with PCP patient can still follow up on this order. Arranged transportation with [place] for patient to go to x-ray on 03/03/22.  Review of patient status, including review of consultants reports, relevant laboratory and other test results, and collaboration with appropriate care team members and the patient's provider was performed as part of comprehensive patient evaluation and provision of services.    [name], [facility] [phone]

## 2022-03-02 ENCOUNTER — Ambulatory Visit (HOSPITAL_COMMUNITY)
Admission: RE | Admit: 2022-03-02 | Discharge: 2022-03-02 | Disposition: A | Discharge status: Home / Self Care | Payer: Medicaid Other | Source: Ambulatory Visit | Attending: Nurse Practitioner | Admitting: Nurse Practitioner

## 2022-03-02 DIAGNOSIS — G8929 Other chronic pain: Secondary | ICD-10-CM | POA: Insufficient documentation

## 2022-03-02 DIAGNOSIS — M545 Low back pain, unspecified: Secondary | ICD-10-CM | POA: Diagnosis not present

## 2022-03-04 ENCOUNTER — Telehealth: Payer: Self-pay | Admitting: Clinical

## 2022-03-04 ENCOUNTER — Other Ambulatory Visit: Payer: Self-pay | Admitting: Nurse Practitioner

## 2022-03-04 DIAGNOSIS — I7090 Unspecified atherosclerosis: Secondary | ICD-10-CM

## 2022-03-04 DIAGNOSIS — M160 Bilateral primary osteoarthritis of hip: Secondary | ICD-10-CM

## 2022-03-05 NOTE — Telephone Encounter (Signed)
Integrated Behavioral Health General Follow Up Note  02/25/22 Name: Mussa Groesbeck MRN: [id number] DOB: 05/20/1975 Larance Ratledge is a 46 y.o. year old male who sees [name], NP for primary care. LCSW was consulted to assess patient needs and assist with mental health concerns.  Interpreter: No.   Interpreter Name & Language: none  Assessment: Patient experiencing mental health concerns and other social/environmental needs.   Ongoing Intervention: Patient called and reported he wants to follow up with the eye doctor he saw recently but cannot remember their name or where they are located. Coordinating with referral coordinator on this.   Review of patient status, including review of consultants reports, relevant laboratory and other test results, and collaboration with appropriate care team members and the patient's provider was performed as part of comprehensive patient evaluation and provision of services.    [name], [facility] [phone]

## 2022-03-06 ENCOUNTER — Telehealth: Payer: Self-pay | Admitting: Clinical

## 2022-03-06 NOTE — Telephone Encounter (Signed)
Integrated Behavioral Health General Follow Up Note  02/25/22 Name: Frank Schneider MRN: [id number] DOB: 01-Jul-1975 Frank Schneider is a 46 y.o. year old male who sees [name], NP for primary care. LCSW was consulted to assess patient needs and assist with mental health concerns.  Interpreter: No.   Interpreter Name & Language: none  Assessment: Patient experiencing mental health concerns and other social/environmental needs.   Ongoing Intervention: Patient was able to find the information for his eye doctor and called CSW to report this. CSW and patient called [facility] together and scheduled follow up appointment for patient. CSW arranged transportation with [place] for patient to this appointment.   Patient interested in a [employer] or other gym membership. His health insurance plan may have a benefit like this. Will call his plan together next week.   Review of patient status, including review of consultants reports, relevant laboratory and other test results, and collaboration with appropriate care team members and the patient's provider was performed as part of comprehensive patient evaluation and provision of services.    [name], [facility] [phone]

## 2022-03-09 ENCOUNTER — Telehealth: Payer: Self-pay | Admitting: Clinical

## 2022-03-09 NOTE — Telephone Encounter (Addendum)
Integrated Behavioral Health General Follow Up Note  02/25/22 Name: Frank Schneider MRN: [id number] DOB: [birth year] Frank Schneider is a 46 y.o. year old male who sees [name], NP for primary care. LCSW was consulted to assess patient needs and assist with mental health concerns.  Interpreter: No.   Interpreter Name & Language: none  Assessment: Patient experiencing mental health concerns and other social/environmental needs.   Ongoing Intervention: Patient interested in a [employer] or other gym membership. Called his health insurance plan together to see if they had a benefit for this; they only provide this for youth, not adults. Provided patient with link to financial assistance application for [employer].   Patient also needed assistance with transportation to upcoming appointments [facility]. CSW arranged transportation to these with [place].  Review of patient status, including review of consultants reports, relevant laboratory and other test results, and collaboration with appropriate care team members and the patient's provider was performed as part of comprehensive patient evaluation and provision of services.    [name], [facility] [phone]

## 2022-03-10 ENCOUNTER — Encounter: Payer: Self-pay | Admitting: Orthopaedic Surgery

## 2022-03-10 ENCOUNTER — Ambulatory Visit (INDEPENDENT_AMBULATORY_CARE_PROVIDER_SITE_OTHER): Payer: Medicaid Other | Admitting: Orthopaedic Surgery

## 2022-03-10 ENCOUNTER — Other Ambulatory Visit: Payer: Self-pay | Admitting: *Deleted

## 2022-03-10 DIAGNOSIS — M545 Low back pain, unspecified: Secondary | ICD-10-CM

## 2022-03-10 DIAGNOSIS — G8929 Other chronic pain: Secondary | ICD-10-CM | POA: Diagnosis not present

## 2022-03-10 DIAGNOSIS — I739 Peripheral vascular disease, unspecified: Secondary | ICD-10-CM

## 2022-03-10 MED ORDER — TRAMADOL HCL 50 MG PO TABS: 50.0000 mg | ORAL_TABLET | Freq: Two times a day (BID) | ORAL | 0 refills | Status: DC | PRN

## 2022-03-10 NOTE — Progress Notes (Signed)
Office Visit Note   Patient: Frank Schneider           Date of Birth: 1975/10/15           MRN: [id number] Visit Date: 03/10/2022              Requested by: [name], NP [phone] [address] [place],  [place] [address] PCP: [name], NP   Assessment & Plan: Visit Diagnoses:  1. Chronic bilateral low back pain without sciatica     Plan: Impression is chronic bilateral low back pain without radiculopathy.  At this point, believe the patient would benefit from a course of formal physical therapy.  He do not feel symptomatic enough to start him on a steroid pack.  He is on a full dose aspirin for previous CVA x4 so I do not want to add any anti-inflammatories to this.  He will follow-up with [place] as needed.  Follow-Up Instructions: Return if symptoms worsen or fail to improve.   Orders:  Orders Placed This Encounter  Procedures   Ambulatory referral to Physical Therapy   Meds ordered this encounter  Medications   traMADol (ULTRAM) 50 MG tablet    Sig: Take 1 tablet (50 mg total) by mouth every 12 (twelve) hours as needed.    Dispense:  30 tablet    Refill:  0      Procedures: No procedures performed   Clinical Data: No additional findings.   Subjective: Chief Complaint  Patient presents with   Lower Back - Pain    HPI patient is a pleasant 46 year old gentleman who comes in today with bilateral low back pain for the past year.  He denies any injury or change in activity.  He denies any pain radiating to the groin or down either leg.  The pain is constant.  The symptoms are worse with any strenuous activity.  He notes his pain did improve after being diagnosed with a vitamin D deficiency and being placed on 50,000 units of vitamin D weekly.  Once he finished this his symptoms returned.  He has been taking Tylenol and aspirin as well as ibuprofen without relief.  He does have a history of diabetic neuropathy.  Review of Systems as detailed in HPI.  All  others reviewed and are negative.   Objective: Vital Signs: There were no vitals taken for this visit.  Physical Exam well-developed well-nourished gentleman in no acute distress.  Alert and oriented x3.  Ortho Exam lumbar spine exam shows no spinous or paraspinous tenderness.  No pain with lumbar flexion, extension or rotation.  Negative straight leg raise both sides.  Negative logroll.  Negative FADIR.  No focal weakness.  Neurovascular intact distally.  Specialty Comments:  No specialty comments available.  Imaging: No new imaging   PMFS History: Patient Active Problem List   Diagnosis Date Noted   Type 2 diabetes mellitus with hyperglycemia, without long-term current use of insulin ([place]) 01/30/2022   Vitamin D deficiency 09/15/2021   Anxiety and depression 09/24/2019   Chronic left flank pain 09/24/2019   Dysphagia 04/30/2018   Ischemic stroke ([place]) 04/29/2018   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   Acute appendicitis 12/16/2015   Right cataract 10/04/2015   Tobacco dependence 10/04/2015   Absolute anemia 08/07/2015   Neuropathy 08/07/2015   Cerebrovascular accident (CVA) ([place])    Leukocytosis    Stroke (cerebrum) ([place]) 05/09/2015   Type 2 diabetes mellitus with circulatory disorder ([place]) 03/12/2015  Hyperlipidemia 02/14/2015   History of CVA (cerebrovascular accident) 02/14/2015   CVA (cerebral vascular accident) ([place]) 01/01/2015   Diabetes mellitus type 2 with complications, uncontrolled 01/01/2015   Stroke [facility])    Essential hypertension    Smoker    Tobacco use    Past Medical History:  Diagnosis Date   Blind right eye    Diabetes mellitus    Takes Metformin   History of appendicitis 11/2015   Hypertension    Left flank pain 12/2019   Priapism    Stroke ([place]) 04/2015   Tobacco use    Vision abnormalities    Vitamin D deficiency 12/2019    Family History  Problem Relation Age of Onset   Diabetes Mother    Hypertension Mother    Diabetes Maternal  Grandmother    Hypertension Maternal Grandmother    Depression Maternal Grandmother     Past Surgical History:  Procedure Laterality Date   APPENDECTOMY     EYE SURGERY     cataract removed 05/27/2016 right eye    INCISION AND DRAINAGE PERIRECTAL ABSCESS  07/28/2011   Procedure: IRRIGATION AND DEBRIDEMENT PERIRECTAL ABSCESS;  Surgeon: Adin Hector, MD;  Location: [facility];  Service: General;  Laterality: N/A;   of skin muscle and subcutaneous tissue of perimeum  8cmx12cm area    IR GENERIC HISTORICAL  01/08/2016   IR RADIOLOGIST EVAL & MGMT 01/08/2016 [facility] INTERV RAD   LAPAROSCOPIC APPENDECTOMY N/A 12/16/2015   Procedure: APPENDECTOMY LAPAROSCOPIC;  Surgeon: Coralie Keens, MD;  Location: [facility];  Service: General;  Laterality: N/A;   PENECTOMY     Social History   Occupational History   Not on file  Tobacco Use   Smoking status: Some Days    Packs/day: 1.00    Years: 8.00    Total pack years: 8.00    Types: Cigarettes   Smokeless tobacco: Never   Tobacco comments:    1 pack per day.  Vaping Use   Vaping Use: Never used  Substance and Sexual Activity   Alcohol use: Yes    Comment: occ   Drug use: Not Currently    Types: Marijuana    Comment: 3x wk   Sexual activity: Yes

## 2022-03-11 DIAGNOSIS — H5440 Blindness, one eye, unspecified eye: Secondary | ICD-10-CM | POA: Diagnosis not present

## 2022-03-12 ENCOUNTER — Ambulatory Visit (HOSPITAL_COMMUNITY)
Admission: RE | Admit: 2022-03-12 | Discharge: 2022-03-12 | Disposition: A | Discharge status: Home / Self Care | Payer: Medicaid Other | Source: Ambulatory Visit | Attending: Vascular Surgery | Admitting: Vascular Surgery

## 2022-03-12 DIAGNOSIS — I739 Peripheral vascular disease, unspecified: Secondary | ICD-10-CM | POA: Diagnosis not present

## 2022-03-17 ENCOUNTER — Telehealth: Payer: Self-pay | Admitting: Clinical

## 2022-03-17 ENCOUNTER — Encounter: Payer: Medicaid Other | Admitting: Vascular Surgery

## 2022-03-17 NOTE — Telephone Encounter (Addendum)
Integrated Behavioral Health General Follow Up Note  02/25/22 Name: Frank Schneider MRN: [id number] DOB: [birth year] Frank Schneider is a 46 y.o. year old male who sees [name], NP for primary care. LCSW was consulted to assess patient needs and assist with mental health concerns.  Interpreter: No.   Interpreter Name & Language: none  Assessment: Patient experiencing mental health concerns and other social/environmental needs.   Ongoing Intervention: Patient called CSW because he missed vascular follow up this morning. Called vascular with patient and rescheduled. Patient also has a PT appointment scheduled. Arranged transportation for both of these appointments with [place].  Review of patient status, including review of consultants reports, relevant laboratory and other test results, and collaboration with appropriate care team members and the patient's provider was performed as part of comprehensive patient evaluation and provision of services.    [name], [facility] [phone]

## 2022-03-24 ENCOUNTER — Ambulatory Visit (INDEPENDENT_AMBULATORY_CARE_PROVIDER_SITE_OTHER): Payer: Medicaid Other | Admitting: Vascular Surgery

## 2022-03-24 ENCOUNTER — Encounter: Payer: Self-pay | Admitting: Vascular Surgery

## 2022-03-24 VITALS — BP 167/102 | HR 69 | Temp 97.8°F | Resp 16 | Ht 70.5 in | Wt 183.0 lb

## 2022-03-24 DIAGNOSIS — I70213 Atherosclerosis of native arteries of extremities with intermittent claudication, bilateral legs: Secondary | ICD-10-CM | POA: Diagnosis not present

## 2022-03-24 DIAGNOSIS — I70219 Atherosclerosis of native arteries of extremities with intermittent claudication, unspecified extremity: Secondary | ICD-10-CM | POA: Insufficient documentation

## 2022-03-24 MED ORDER — CILOSTAZOL 100 MG PO TABS: 100.0000 mg | ORAL_TABLET | Freq: Two times a day (BID) | ORAL | 11 refills | Status: DC

## 2022-03-24 NOTE — Progress Notes (Signed)
Patient name: Frank Schneider MRN: [id number] DOB: [birth year] Sex: male  REASON FOR CONSULT: PAD  HPI: Frank Schneider is a 46 y.o. male, with history of hypertension, diabetes and tobacco abuse that presents for evaluation of PAD.  He states he has cramping in both legs in the calves when he walks.  This has been ongoing for years.  The right leg is worse.  Feels like he can walk about 150 yards before he has to stop.  He did have noninvasive imaging.  ABIs on 03/12/2022 were 0.56 on the right biphasic and 0.88 on the left triphasic.  No previous vascular interventions.  On disability.  Past Medical History:  Diagnosis Date   Blind right eye    Diabetes mellitus    Takes Metformin   History of appendicitis 11/2015   Hypertension    Left flank pain 12/2019   Priapism    Stroke ([place]) 04/2015   Tobacco use    Vision abnormalities    Vitamin D deficiency 12/2019    Past Surgical History:  Procedure Laterality Date   APPENDECTOMY     EYE SURGERY     cataract removed 05/27/2016 right eye    INCISION AND DRAINAGE PERIRECTAL ABSCESS  07/28/2011   Procedure: IRRIGATION AND DEBRIDEMENT PERIRECTAL ABSCESS;  Surgeon: Adin Hector, MD;  Location: [facility];  Service: General;  Laterality: N/A;   of skin muscle and subcutaneous tissue of perimeum  8cmx12cm area    IR GENERIC HISTORICAL  01/08/2016   IR RADIOLOGIST EVAL & MGMT 01/08/2016 [facility] INTERV RAD   LAPAROSCOPIC APPENDECTOMY N/A 12/16/2015   Procedure: APPENDECTOMY LAPAROSCOPIC;  Surgeon: Coralie Keens, MD;  Location: [facility];  Service: General;  Laterality: N/A;   PENECTOMY      Family History  Problem Relation Age of Onset   Diabetes Mother    Hypertension Mother    Diabetes Maternal Grandmother    Hypertension Maternal Grandmother    Depression Maternal Grandmother     SOCIAL HISTORY: Social History   Socioeconomic History   Marital status: Single    Spouse name: Not on file   Number of children: Not on file   Years  of education: Not on file   Highest education level: Not on file  Occupational History   Not on file  Tobacco Use   Smoking status: Some Days    Packs/day: 1.00    Years: 8.00    Total pack years: 8.00    Types: Cigarettes   Smokeless tobacco: Never   Tobacco comments:    1 pack per day.  Vaping Use   Vaping Use: Never used  Substance and Sexual Activity   Alcohol use: Yes    Comment: occ   Drug use: Not Currently    Types: Marijuana    Comment: 3x wk   Sexual activity: Yes  Other Topics Concern   Not on file  Social History Narrative   ** Merged History Encounter **       Social Determinants of Health   Financial Resource Strain: Not on file  Food Insecurity: Not on file  Transportation Needs: Unmet Transportation Needs (10/14/2021)   PRAPARE - Hydrologist (Medical): Yes    Lack of Transportation (Non-Medical): Yes  Physical Activity: Not on file  Stress: Not on file  Social Connections: Not on file  Intimate Partner Violence: Not on file    No Known Allergies  Current Outpatient Medications  Medication Sig Dispense Refill  acetaminophen (TYLENOL) 500 MG tablet Take 2 tablets (Total= 1,000 mg), by mouth, 2 times a day as needed. (Patient taking differently: Take 1,000 mg by mouth 2 (two) times daily as needed for moderate pain.) 120 tablet 3   amLODipine (NORVASC) 10 MG tablet Take 1 tablet (10 mg total) by mouth daily. 90 tablet 3   aspirin 325 MG tablet Take 1 tablet (325 mg total) by mouth daily. 30 tablet 0   atorvastatin (LIPITOR) 40 MG tablet Take 1 tablet (40 mg total) by mouth daily. 90 tablet 3   Blood Glucose Monitoring Suppl (ACCU-CHEK GUIDE) w/Device KIT Use in the morning, at noon, in the evening, and at bedtime. 1 kit 0   ferrous sulfate 325 (65 FE) MG tablet Take 1 tablet (325 mg total) by mouth daily with breakfast. 30 tablet 3   glucose blood (TRUE METRIX BLOOD GLUCOSE TEST) test strip Use as instructed 100 each 12    ibuprofen (ADVIL) 800 MG tablet take 1 tablet (800 mg) by mouth every 8 hours as needed for pain with food 30 tablet 0   insulin glargine (LANTUS SOLOSTAR) 100 UNIT/ML Solostar Pen Inject 30 Units into the skin at bedtime. 15 mL 0   Insulin Pen Needle (NOVOFINE) 30G X 8 MM MISC Use to inject into the skin as needed as directed 100 each 0   Lancets (FREESTYLE) lancets Use as instructed 100 each 12   lisinopril (ZESTRIL) 20 MG tablet Take 1 tablet (20 mg total) by mouth daily. 90 tablet 3   metFORMIN (GLUCOPHAGE-XR) 500 MG 24 hr tablet Take 1 tablet (500 mg total) by mouth 2 (two) times daily. 180 tablet 1   traMADol (ULTRAM) 50 MG tablet Take 1 tablet (50 mg total) by mouth every 12 (twelve) hours as needed. 30 tablet 0   No current facility-administered medications for this visit.    REVIEW OF SYSTEMS:  _0  denotes positive finding, _1  denotes negative finding Cardiac  Comments:  Chest pain or chest pressure:    Shortness of breath upon exertion:    Short of breath when lying flat:    Irregular heart rhythm:        Vascular    Pain in calf, thigh, or hip brought on by ambulation: x Bilateral legs  Pain in feet at night that wakes you up from your sleep:     Blood clot in your veins:    Leg swelling:         Pulmonary    Oxygen at home:    Productive cough:     Wheezing:         Neurologic    Sudden weakness in arms or legs:     Sudden numbness in arms or legs:     Sudden onset of difficulty speaking or slurred speech:    Temporary loss of vision in one eye:     Problems with dizziness:         Gastrointestinal    Blood in stool:     Vomited blood:         Genitourinary    Burning when urinating:     Blood in urine:        Psychiatric    Major depression:         Hematologic    Bleeding problems:    Problems with blood clotting too easily:        Skin    Rashes or ulcers:  Constitutional    Fever or chills:      PHYSICAL EXAM: Vitals:   03/24/22 1119   BP: (!) 167/102  Pulse: 69  Resp: 16  Temp: 97.8 F (36.6 C)  TempSrc: Temporal  SpO2: 97%  Weight: 183 lb (83 kg)  Height: 5' 10.5" (1.791 m)    GENERAL: The patient is a well-nourished male, in no acute distress. The vital signs are documented above. CARDIAC: There is a regular rate and rhythm.  VASCULAR:  Palpable femoral pulses bilaterally Left DP palpable No palpable right pedal pulses No lower extremity tissue loss PULMONARY: No respiratory distress. ABDOMEN: Soft and non-tender. MUSCULOSKELETAL: There are no major deformities or cyanosis. NEUROLOGIC: No focal weakness or paresthesias are detected. SKIN: There are no ulcers or rashes noted. PSYCHIATRIC: The patient has a normal affect.  DATA:    ABIs on 03/12/2022 were 0.56 on the right biphasic and 0.88 on the left triphasic.  Assessment/Plan:  46 year old male presents for evaluation of PAD.  He describes lower extremity claudication worse in the right lower extremity.  His ABIs were 0.56 on the right biphasic and 0.88 on the left triphasic.  He can walk up to about 150 yards before he has to stop.  I discussed typically managing claudication with lifestyle modification prior to intervention.  I have recommended smoking cessation, walking therapies 30 minutes a day 3 times a week and have also prescribed him Pletal for a trial of medical therapy.  I will follow-up with him in 3 months with repeat ABIs.  Discussed if his symptoms get worse we can entertain intervention in the future but certainly at a young age of 51 and still smoking I would encourage lifestyle modification first.  No signs of critical limb ischemia like rest pain or tissue loss.   Marty Heck, MD [facility] Office: [phone]

## 2022-03-25 DIAGNOSIS — E1142 Type 2 diabetes mellitus with diabetic polyneuropathy: Secondary | ICD-10-CM | POA: Diagnosis not present

## 2022-03-25 DIAGNOSIS — L603 Nail dystrophy: Secondary | ICD-10-CM | POA: Diagnosis not present

## 2022-03-25 DIAGNOSIS — M792 Neuralgia and neuritis, unspecified: Secondary | ICD-10-CM | POA: Diagnosis not present

## 2022-03-25 DIAGNOSIS — M2041 Other hammer toe(s) (acquired), right foot: Secondary | ICD-10-CM | POA: Diagnosis not present

## 2022-03-25 DIAGNOSIS — I739 Peripheral vascular disease, unspecified: Secondary | ICD-10-CM | POA: Diagnosis not present

## 2022-03-25 DIAGNOSIS — M2042 Other hammer toe(s) (acquired), left foot: Secondary | ICD-10-CM | POA: Diagnosis not present

## 2022-03-25 DIAGNOSIS — B351 Tinea unguium: Secondary | ICD-10-CM | POA: Diagnosis not present

## 2022-03-25 DIAGNOSIS — L84 Corns and callosities: Secondary | ICD-10-CM | POA: Diagnosis not present

## 2022-03-25 NOTE — Therapy (Incomplete)
OUTPATIENT PHYSICAL THERAPY THORACOLUMBAR EVALUATION   Patient Name: Frank Schneider MRN: [id number] DOB:March 15, 1976, 46 y.o., male Today's Date: 03/25/2022  END OF SESSION:   Past Medical History:  Diagnosis Date   Blind right eye    Diabetes mellitus    Takes Metformin   History of appendicitis 11/2015   Hypertension    Left flank pain 12/2019   Priapism    Stroke ([place]) 04/2015   Tobacco use    Vision abnormalities    Vitamin D deficiency 12/2019   Past Surgical History:  Procedure Laterality Date   APPENDECTOMY     EYE SURGERY     cataract removed 05/27/2016 right eye    INCISION AND DRAINAGE PERIRECTAL ABSCESS  07/28/2011   Procedure: IRRIGATION AND DEBRIDEMENT PERIRECTAL ABSCESS;  Surgeon: Adin Hector, MD;  Location: [facility];  Service: General;  Laterality: N/A;   of skin muscle and subcutaneous tissue of perimeum  8cmx12cm area    IR GENERIC HISTORICAL  01/08/2016   IR RADIOLOGIST EVAL & MGMT 01/08/2016 [facility] INTERV RAD   LAPAROSCOPIC APPENDECTOMY N/A 12/16/2015   Procedure: APPENDECTOMY LAPAROSCOPIC;  Surgeon: Coralie Keens, MD;  Location: [facility];  Service: General;  Laterality: N/A;   PENECTOMY     Patient Active Problem List   Diagnosis Date Noted   Atherosclerosis of native arteries of extremity with intermittent claudication ([place]) 03/24/2022   Type 2 diabetes mellitus with hyperglycemia, without long-term current use of insulin ([place]) 01/30/2022   Vitamin D deficiency 09/15/2021   Anxiety and depression 09/24/2019   Chronic left flank pain 09/24/2019   Dysphagia 04/30/2018   Ischemic stroke ([place]) 04/29/2018   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   Acute appendicitis 12/16/2015   Right cataract 10/04/2015   Tobacco dependence 10/04/2015   Absolute anemia 08/07/2015   Neuropathy 08/07/2015   Cerebrovascular accident (CVA) ([place])    Leukocytosis    Stroke (cerebrum) ([place]) 05/09/2015   Type 2 diabetes mellitus with circulatory disorder ([place]) 03/12/2015    Hyperlipidemia 02/14/2015   History of CVA (cerebrovascular accident) 02/14/2015   CVA (cerebral vascular accident) ([place]) 01/01/2015   Diabetes mellitus type 2 with complications, uncontrolled 01/01/2015   Stroke ([place])    Essential hypertension    Smoker    Tobacco use     PCP: [name], NP  REFERRING PROVIDER: Aundra Dubin, PA-C  REFERRING DIAG: Chronic bilateral low back pain without sciatica  Rationale for Evaluation and Treatment: Rehabilitation  THERAPY DIAG:  No diagnosis found.  ONSET DATE: ***   SUBJECTIVE SUBJECTIVE STATEMENT: ***  PERTINENT HISTORY:  CVA, DM II with neuropathy, HTN, anxiety/depression  PAIN:  Are you having pain? Yes:  NPRS scale: ***/10 Pain location: Low back Pain description: *** Aggravating factors: *** Relieving factors: ***  PRECAUTIONS: {Therapy precautions:24002}  WEIGHT BEARING RESTRICTIONS: No  FALLS:  Has patient fallen in last 6 months? {fallsyesno:27318}  LIVING ENVIRONMENT: Lives with: {[facility] lives with:25569::"lives with their family"} Lives in: {Lives in:25570} Stairs: {opstairs:27293} Has following equipment at home: {Assistive devices:23999}  OCCUPATION: ***  PLOF: {PLOF:24004}  PATIENT GOALS: ***   OBJECTIVE:  DIAGNOSTIC FINDINGS:  ***  PATIENT SURVEYS:  Modified Oswestry ***   SCREENING FOR RED FLAGS: Negative  COGNITION: Overall cognitive status: Within functional limits for tasks assessed     SENSATION: {sensation:27233}  MUSCLE LENGTH: ***  POSTURE:   ***  PALPATION: ***  LUMBAR ROM:   AROM eval  Flexion   Extension   Right lateral flexion   Left lateral  flexion   Right rotation   Left rotation    (Blank rows = not tested)  LOWER EXTREMITY ROM:     {AROM/PROM:27142}  Right eval Left eval  Hip flexion    Hip extension    Hip abduction    Hip adduction    Hip internal rotation    Hip external rotation    Knee flexion    Knee extension    Ankle  dorsiflexion    Ankle plantarflexion    Ankle inversion    Ankle eversion     (Blank rows = not tested)  LOWER EXTREMITY MMT:    MMT Right eval Left eval  Hip flexion    Hip extension    Hip abduction    Hip adduction    Hip internal rotation    Hip external rotation    Knee flexion    Knee extension    Ankle dorsiflexion    Ankle plantarflexion    Ankle inversion    Ankle eversion     (Blank rows = not tested)  LUMBAR SPECIAL TESTS:  {lumbar special test:25242}  FUNCTIONAL TESTS:  {Functional tests:24029}  GAIT: Distance walked: *** Assistive device utilized: {Assistive devices:23999} Level of assistance: {Levels of assistance:24026} Comments: ***   TODAY'S TREATMENT:         [facility] Adult PT Treatment:                                                DATE: 03/26/2022 Therapeutic Exercise: ***  PATIENT EDUCATION:  Education details: Exam findings, POC, HEP Person educated: Patient Education method: Explanation, Demonstration, Tactile cues, Verbal cues, and Handouts Education comprehension: verbalized understanding, returned demonstration, verbal cues required, tactile cues required, and needs further education  HOME EXERCISE PROGRAM: ***   ASSESSMENT: CLINICAL IMPRESSION: Patient is a 46 y.o. male who was seen today for physical therapy evaluation and treatment for chronic low back pain.   OBJECTIVE IMPAIRMENTS: {opptimpairments:25111}.   ACTIVITY LIMITATIONS: {activitylimitations:27494}  PARTICIPATION LIMITATIONS: {participationrestrictions:25113}  PERSONAL FACTORS: {Personal factors:25162} are also affecting patient's functional outcome.   REHAB POTENTIAL: {rehabpotential:25112}  CLINICAL DECISION MAKING: {clinical decision making:25114}  EVALUATION COMPLEXITY: {Evaluation complexity:25115}   GOALS: Goals reviewed with patient? {yes/no:20286}  SHORT TERM GOALS: Target date: ***  Patient will be I with initial HEP in order to progress with  therapy. Baseline: HEP provided at eval Goal status: INITIAL  2.  *** Baseline:  Goal status: INITIAL  3.  *** Baseline:  Goal status: INITIAL  LONG TERM GOALS: Target date: ***  Patient will be I with final HEP to maintain progress from PT. Baseline: HEP provided at eval Goal status: INITIAL  2.  *** Baseline:  Goal status: INITIAL  3.  *** Baseline:  Goal status: INITIAL  4.  *** Baseline:  Goal status: INITIAL   PLAN: PT FREQUENCY: {rehab frequency:25116}  PT DURATION: {rehab duration:25117}  PLANNED INTERVENTIONS: {rehab planned interventions:25118::"Therapeutic exercises","Therapeutic activity","Neuromuscular re-education","Balance training","Gait training","Patient/Family education","Self Care","Joint mobilization"}.  PLAN FOR NEXT SESSION: Review HEP and progress PRN, ***   Hilda Blades, PT, DPT, LAT, ATC 03/25/22  2:34 PM Phone: [phone] Fax: [phone]

## 2022-03-26 ENCOUNTER — Ambulatory Visit: Payer: Medicaid Other | Attending: Physician Assistant | Admitting: Physical Therapy

## 2022-04-01 ENCOUNTER — Other Ambulatory Visit: Payer: Self-pay

## 2022-04-01 DIAGNOSIS — I739 Peripheral vascular disease, unspecified: Secondary | ICD-10-CM

## 2022-04-14 ENCOUNTER — Other Ambulatory Visit: Payer: Self-pay

## 2022-04-16 ENCOUNTER — Telehealth: Payer: Self-pay | Admitting: Clinical

## 2022-04-16 NOTE — Telephone Encounter (Signed)
Integrated Behavioral Health General Follow Up Note  04/16/2022 Name: Frank Schneider MRN: [id number] DOB: [birth year] Frank Schneider is a 46 y.o. year old male who sees [name], NP for primary care. LCSW was initially consulted to assist with mental health concerns and social needs.  Interpreter: No.   Interpreter Name & Language: none  Assessment: Patient experiencing mental health concerns and other social/environmental needs.   Ongoing Intervention: Patient called CSW and asked for assistance in getting an eye patch. Called his eye doctor together with patient and they advised that patient can purchase one at a pharmacy. Patient to look into cost and follow up with CSW.  Review of patient status, including review of consultants reports, relevant laboratory and other test results, and collaboration with appropriate care team members and the patient's provider was performed as part of comprehensive patient evaluation and provision of services.    Frank Schneider, [facility] [phone]

## 2022-04-30 ENCOUNTER — Telehealth: Payer: Self-pay | Admitting: Clinical

## 2022-04-30 NOTE — Telephone Encounter (Signed)
Integrated Behavioral Health General Follow Up Note  04/30/2022 Name: Frank Schneider MRN: [id number] DOB: 13-Nov-1975 [place] Perry is a 47 y.o. year old male who sees [name], NP for primary care. LCSW was initially consulted to assist with mental health concerns and social needs.  Interpreter: No.   Interpreter Name & Language: none  Assessment: Patient experiencing mental health concerns and other social/environmental needs.   Ongoing Intervention: Called patient and reminded him of eye doctor follow up 05/12/22. Scheduled transportation for patient to this appointment with [facility].  Review of patient status, including review of consultants reports, relevant laboratory and other test results, and collaboration with appropriate care team members and the patient's provider was performed as part of comprehensive patient evaluation and provision of services.    [name], [facility] [phone]

## 2022-05-12 DIAGNOSIS — H5711 Ocular pain, right eye: Secondary | ICD-10-CM | POA: Diagnosis not present

## 2022-05-12 DIAGNOSIS — H5440 Blindness, one eye, unspecified eye: Secondary | ICD-10-CM | POA: Diagnosis not present

## 2022-05-18 ENCOUNTER — Telehealth: Payer: Self-pay | Admitting: Clinical

## 2022-05-18 NOTE — Telephone Encounter (Signed)
Integrated Behavioral Health General Follow Up Note  05/18/2022 Name: Frank Schneider MRN: [id number] DOB: 28-Sep-1975 Frank Schneider is a 47 y.o. year old male who sees [name], NP for primary care. LCSW was initially consulted to assist with mental health concerns and social needs.  Interpreter: No.   Interpreter Name & Language: none  Assessment: Patient experiencing mental health concerns and other social/environmental needs.   Ongoing Intervention: Patient called and reported he had gone to his eye doctor follow up last week. He will now be having ophthalmological surgery. He has a pre-surgery appointment 05/27/22. Scheduled transportation with [facility] to this appointment.   Patient also has requested a referral for personal care aide (PCA). Our office has done this referral in the past but patient has not successfully been connected with a PCA provider. Coordinating with referral coordinator and PCP for new referral. Patient will now need additional assistance at home due to upcoming eye surgery.  Review of patient status, including review of consultants reports, relevant laboratory and other test results, and collaboration with appropriate care team members and the patient's provider was performed as part of comprehensive patient evaluation and provision of services.    [name], [facility] [phone]

## 2022-05-21 ENCOUNTER — Telehealth: Payer: Self-pay | Admitting: Clinical

## 2022-05-21 NOTE — Telephone Encounter (Addendum)
Integrated Behavioral Health General Follow Up Note  05/21/2022 Name: Denorris Reust MRN: [id number] DOB: 01-05-1976 Dontavis Tschantz is a 47 y.o. year old male who sees [name], NP for primary care. LCSW was initially consulted to assist with mental health concerns and social needs.  Interpreter: No.   Interpreter Name & Language: none  Assessment: Patient experiencing mental health concerns and other social/environmental needs.   Ongoing Intervention: Patient called and asked for assistance with an [employer] application. CSW reviewed notes and did confirm that an application had been submitted around this time last year. Called [employer] and confirmed patient is active with them. Advised patient of the number to call for ride reservations.  Review of patient status, including review of consultants reports, relevant laboratory and other test results, and collaboration with appropriate care team members and the patient's provider was performed as part of comprehensive patient evaluation and provision of services.    [name], [facility] [phone]

## 2022-05-25 ENCOUNTER — Telehealth: Payer: Self-pay | Admitting: Nurse Practitioner

## 2022-05-25 NOTE — Telephone Encounter (Signed)
Caller & Relationship to patient:  self MRN #  [id number]   Call Back Number: [phone]  Date of Last Office Visit: 05/21/2022     Date of Next Office Visit: Visit date not found    Medication(s) to be Refilled: Tra,adol  Preferred Pharmacy: [facility]  ** Please notify patient to allow 48-72 hours to process** **Let patient know to contact pharmacy at the end of the day to make sure medication is ready. ** **If patient has not been seen in a year or longer, book an appointment **Advise to use MyChart for refill requests OR to contact their pharmacy

## 2022-05-26 ENCOUNTER — Other Ambulatory Visit: Payer: Self-pay | Admitting: Physician Assistant

## 2022-05-27 ENCOUNTER — Other Ambulatory Visit: Payer: Self-pay

## 2022-05-27 ENCOUNTER — Emergency Department (HOSPITAL_COMMUNITY)
Admission: EM | Admit: 2022-05-27 | Discharge: 2022-05-27 | Disposition: A | Discharge status: Home / Self Care | Payer: Medicaid Other | Attending: Emergency Medicine | Admitting: Emergency Medicine

## 2022-05-27 DIAGNOSIS — Z79899 Other long term (current) drug therapy: Secondary | ICD-10-CM | POA: Diagnosis not present

## 2022-05-27 DIAGNOSIS — G8929 Other chronic pain: Secondary | ICD-10-CM | POA: Insufficient documentation

## 2022-05-27 DIAGNOSIS — Z7982 Long term (current) use of aspirin: Secondary | ICD-10-CM | POA: Diagnosis not present

## 2022-05-27 DIAGNOSIS — Z8673 Personal history of transient ischemic attack (TIA), and cerebral infarction without residual deficits: Secondary | ICD-10-CM | POA: Insufficient documentation

## 2022-05-27 DIAGNOSIS — I10 Essential (primary) hypertension: Secondary | ICD-10-CM | POA: Diagnosis not present

## 2022-05-27 DIAGNOSIS — M545 Low back pain, unspecified: Secondary | ICD-10-CM | POA: Insufficient documentation

## 2022-05-27 DIAGNOSIS — M5459 Other low back pain: Secondary | ICD-10-CM | POA: Diagnosis not present

## 2022-05-27 DIAGNOSIS — Z794 Long term (current) use of insulin: Secondary | ICD-10-CM | POA: Diagnosis not present

## 2022-05-27 DIAGNOSIS — E119 Type 2 diabetes mellitus without complications: Secondary | ICD-10-CM | POA: Diagnosis not present

## 2022-05-27 DIAGNOSIS — Z7984 Long term (current) use of oral hypoglycemic drugs: Secondary | ICD-10-CM | POA: Insufficient documentation

## 2022-05-27 MED ORDER — PREDNISONE 10 MG PO TABS: 40.0000 mg | ORAL_TABLET | Freq: Every day | ORAL | 0 refills | Status: DC

## 2022-05-27 NOTE — ED Provider Notes (Signed)
[facility] Provider Note   CSN: [id number] Arrival date & time: 05/27/22  1621     History  Chief Complaint  Patient presents with   Back Pain    Frank Schneider is a 47 y.o. male.  With history of type 2 diabetes, hypertension, hyperlipidemia, CVA, anxiety, depression who presents to the ED for evaluation of bilateral low back pain.  Symptoms have been present for 1 year.  He states that they got worse last night.  He has been seen by his primary care provider for this over the past year and has had imaging which he states did not show any abnormalities.  It is predictably worsened by flexion, extension, and rotation of the spine.  He describes the pain as a sharp and shooting sensation.  Denies saddle paresthesias, urinary or fecal incontinence, fevers, numbness, weakness, tingling.   Back Pain      Home Medications Prior to Admission medications   Medication Sig Start Date End Date Taking? Authorizing Provider  predniSONE (DELTASONE) 10 MG tablet Take 4 tablets (40 mg total) by mouth daily. 05/27/22  Yes Brena Windsor, [place] Folk, PA-C  acetaminophen (TYLENOL) 500 MG tablet Take 2 tablets (Total= 1,000 mg), by mouth, 2 times a day as needed. Patient taking differently: Take 1,000 mg by mouth 2 (two) times daily as needed for moderate pain. 11/04/21   Tresa Garter, MD  amLODipine (NORVASC) 10 MG tablet Take 1 tablet (10 mg total) by mouth daily. 10/30/21 10/30/22  [name], NP  aspirin 325 MG tablet Take 1 tablet (325 mg total) by mouth daily. 10/30/21   [name], NP  atorvastatin (LIPITOR) 40 MG tablet Take 1 tablet (40 mg total) by mouth daily. 10/30/21 10/30/22  [name], NP  Blood Glucose Monitoring Suppl (ACCU-CHEK GUIDE) w/Device KIT Use in the morning, at noon, in the evening, and at bedtime. 10/16/20   Vevelyn Francois, NP  cilostazol (PLETAL) 100 MG tablet Take 1 tablet (100 mg total) by mouth 2 (two) times daily  before a meal. 03/24/22   Marty Heck, MD  ferrous sulfate 325 (65 FE) MG tablet Take 1 tablet (325 mg total) by mouth daily with breakfast. 10/30/21   [name], NP  glucose blood (TRUE METRIX BLOOD GLUCOSE TEST) test strip Use as instructed 07/20/18   Lanae Boast, FNP  ibuprofen (ADVIL) 800 MG tablet take 1 tablet (800 mg) by mouth every 8 hours as needed for pain with food 01/30/22   [name], NP  insulin glargine (LANTUS SOLOSTAR) 100 UNIT/ML Solostar Pen Inject 30 Units into the skin at bedtime. 01/30/22   [name], NP  Insulin Pen Needle (NOVOFINE) 30G X 8 MM MISC Use to inject into the skin as needed as directed 01/09/22   [name], NP  Lancets (FREESTYLE) lancets Use as instructed 01/03/15   Reyne Dumas, MD  lisinopril (ZESTRIL) 20 MG tablet Take 1 tablet (20 mg total) by mouth daily. 10/30/21 10/30/22  [name], NP  metFORMIN (GLUCOPHAGE-XR) 500 MG 24 hr tablet Take 1 tablet (500 mg total) by mouth 2 (two) times daily. 10/31/21   [name], NP  traMADol (ULTRAM) 50 MG tablet Take 1 tablet (50 mg total) by mouth every 12 (twelve) hours as needed. 03/10/22   Aundra Dubin, PA-C      Allergies    Patient has no known allergies.    Review of Systems  Review of Systems  Musculoskeletal:  Positive for back pain.  All other systems reviewed and are negative.   Physical Exam Updated Vital Signs BP (!) 152/70   Pulse 84   Temp 98.7 F (37.1 C)   Resp 19   Ht 5' 10.5" (1.791 m)   Wt 83.9 kg   SpO2 98%   BMI 26.17 kg/m  Physical Exam Vitals and nursing note reviewed.  Constitutional:      General: He is not in acute distress.    Appearance: Normal appearance. He is normal weight. He is not ill-appearing.  HENT:     Head: Normocephalic and atraumatic.  Pulmonary:     Effort: Pulmonary effort is normal. No respiratory distress.  Abdominal:     General: Abdomen is flat.  Musculoskeletal:        General: No tenderness, deformity  or signs of injury. Normal range of motion.     Cervical back: Neck supple.     Comments: No midline C, T or L-spine tenderness to palpation.  No tenderness to palpation of the low back.  Normal range of motion.  Unable to assess straight leg raise in triage chair.  Skin:    General: Skin is warm and dry.  Neurological:     Mental Status: He is alert and oriented to person, place, and time.  Psychiatric:        Mood and Affect: Mood normal.        Behavior: Behavior normal.     ED Results / Procedures / Treatments   Labs (all labs ordered are listed, but only abnormal results are displayed) Labs Reviewed - No data to display  EKG None  Radiology No results found.  Procedures Procedures    Medications Ordered in ED Medications - No data to display  ED Course/ Medical Decision Making/ A&P                             Medical Decision Making Risk Prescription drug management.  This patient presents to the ED for concern of low back pain, this involves an extensive number of treatment options, and is a complaint that carries with it a high risk of complications and morbidity.  Emergent considerations in the differential diagnosis of back pain include:occult fracture, congenital anomalies, tumors, vascular catastrophes, osteomyelitis of vertebrae, infections of disc, meninges or cord, space occupying lesions within canal leading to cord or root compression including epidural abscess.   Co morbidities that complicate the patient evaluation  type 2 diabetes, hypertension, hyperlipidemia, CVA, anxiety, depression  Additional history obtained from: Nursing notes from this visit.  Afebrile, hemodynamically stable.  47 year old male presents ED for evaluation of atraumatic low back pain that has been present for 1 year.  He denies any symptoms of cord compression.  He has an overall reassuring physical exam.  I have low suspicion for cord compression or epidural abscess as a cause  of his symptoms.  He was describing neuropathic type pain was sharp and shooting sensations.  He states pain did get better with tramadol at home.  Patient will be started on a prednisone burst and encouraged to follow-up with his primary care provider for chronic management as this does appear to be a chronic issue at this point.  He was also encouraged to alternate Tylenol and ibuprofen and educated on appropriate dosing.  He was given return precautions.  Stable at discharge.  At this time there does  not appear to be any evidence of an acute emergency medical condition and the patient appears stable for discharge with appropriate outpatient follow up. Diagnosis was discussed with patient who verbalizes understanding of care plan and is agreeable to discharge. I have discussed return precautions with patient who verbalizes understanding. Patient encouraged to follow-up with their PCP within 1 week. All questions answered.  Note: Portions of this report may have been transcribed using voice recognition software. Every effort was made to ensure accuracy; however, inadvertent computerized transcription errors may still be present.        Final Clinical Impression(s) / ED Diagnoses Final diagnoses:  Chronic bilateral low back pain without sciatica    Rx / DC Orders ED Discharge Orders          Ordered    predniSONE (DELTASONE) 10 MG tablet  Daily        05/27/22 1642              Roylene Reason, Hershal Coria 05/27/22 1658    Carmin Muskrat, MD 05/27/22 1708

## 2022-05-27 NOTE — Discharge Instructions (Signed)
You have been seen today for your complaint of low back pain. Your discharge medications include prednisone.  This is a steroid.  You should take it as prescribed and for the entire duration of the prescription. Alternate tylenol and ibuprofen for pain. You may alternate these every 4 hours. You may take up to 800 mg of ibuprofen at a time and up to 1000 mg of tylenol. Home care instructions are as follows:  Perform gentle daily range of motion stretching exercises Follow up with: Your primary care provider in 1 week for reevaluation Please seek immediate medical care if you develop any of the following symptoms: You develop new bowel or bladder control problems. You have unusual weakness or numbness in your arms or legs. You feel faint. At this time there does not appear to be the presence of an emergent medical condition, however there is always the potential for conditions to change. Please read and follow the below instructions.  Do not take your medicine if  develop an itchy rash, swelling in your mouth or lips, or difficulty breathing; call 911 and seek immediate emergency medical attention if this occurs.  You may review your lab tests and imaging results in their entirety on your MyChart account.  Please discuss all results of fully with your primary care provider and other specialist at your follow-up visit.  Note: Portions of this text may have been transcribed using voice recognition software. Every effort was made to ensure accuracy; however, inadvertent computerized transcription errors may still be present.

## 2022-05-27 NOTE — ED Triage Notes (Signed)
Pt reports lower back pain since yesterday. Denies specific injury, though does state he always has back pain. Denies urinary problems.

## 2022-05-28 ENCOUNTER — Telehealth: Payer: Self-pay

## 2022-05-28 NOTE — Patient Outreach (Signed)
Transition Care Management Follow-up Telephone Call Date of discharge and from where: 05/27/22 [facility]  How have you been since you were released from the hospital? Feeling better Any questions or concerns? Yes  Items Reviewed: Did the pt receive and understand the discharge instructions provided? Yes  Medications obtained and verified? No  Other? No  Any new allergies since your discharge? No  Dietary orders reviewed? No Do you have support at home? Yes   Home Care and Equipment/Supplies: Were home health services ordered? not applicable If so, what is the name of the agency?   Has the agency set up a time to come to the patient's home? not applicable Were any new equipment or medical supplies ordered?  No What is the name of the medical supply agency?  Were you able to get the supplies/equipment? not applicable Do you have any questions related to the use of the equipment or supplies? No  Functional Questionnaire: (I = Independent and D = Dependent) ADLs: I  Bathing/Dressing- I  Meal Prep- I  Eating- I  Maintaining continence- I  Transferring/Ambulation- I  Managing Meds- I  Follow up appointments reviewed:  PCP Hospital f/u appt confirmed? No  Scheduled to see  on  @ . [facility] f/u appt confirmed? No  Scheduled to see  on  @ . Are transportation arrangements needed? Yes  If their condition worsens, is the pt aware to call PCP or go to the Emergency Dept.? No Was the patient provided with contact information for the PCP's office or ED? Yes Was to pt encouraged to call back with questions or concerns? Yes Bsw sent patient resources for food.

## 2022-05-28 NOTE — Telephone Encounter (Signed)
Eye specialty called to get fax number to send over a clarence form. Called back but no answer.   Elyse Jarvis RMA

## 2022-06-09 ENCOUNTER — Telehealth: Payer: Self-pay | Admitting: Clinical

## 2022-06-09 NOTE — Telephone Encounter (Signed)
Patient requesting refill of ibuprofen 800 mg and also needs update on referral for personal care aide

## 2022-06-10 ENCOUNTER — Encounter (HOSPITAL_COMMUNITY): Payer: Self-pay | Admitting: Optometry

## 2022-06-10 ENCOUNTER — Other Ambulatory Visit: Payer: Self-pay

## 2022-06-10 NOTE — Progress Notes (Signed)
S.D.W- Instructions   Your procedure is scheduled on Thurs., Feb. 15, 2024 from 1:00PM-2:30PM.  Report to [facility] Main Entrance "A" at 10:30 A.M., then check in with the Admitting office.  Call this number if you have problems the morning of surgery:  [phone]             If you experience any cold or flu symptoms such as cough, fever, chills, shortness of breath, etc. between now and your scheduled surgery, please notify us at the above         number.  Masks are now required throughout our facilities due to the increasing cases of Covid, Flu, and RSV infections.   Remember:  Do not eat or drink after midnight on Feb. 14th    Take these medicines the morning of surgery with A SIP OF WATER:  acetaminophen (TYLENOL)  amLODipine (NORVASC)  atorvastatin (LIPITOR)  predniSONE (DELTASONE)  If Needed:  traMADol (ULTRAM)   Hold Aspirin and cilostazol (PLETAL) as directed.  As of today, STOP taking any Aspirin (unless otherwise instructed by your surgeon) Aleve, Naproxen, Ibuprofen, Motrin, Advil, Goody's, BC's, all herbal medications, fish oil, and all vitamins.  How to Manage Your Diabetes Before and After Surgery  How do I manage my blood sugar before surgery? Check your blood sugar the morning of your surgery when you wake up and every 2 hours until you get to the Short Stay unit. If your blood sugar is less than 70 mg/dL, you will need to treat for low blood sugar: Do not take insulin. Treat a low blood sugar (less than 70 mg/dL) with  cup of clear juice (cranberry or apple), 4 glucose tablets, OR glucose gel. Recheck blood sugar in 15 minutes after treatment (to make sure it is greater than 70 mg/dL). If your blood sugar is not greater than 70 mg/dL on recheck, call [phone]  for further instructions. Report your blood sugar to the short stay nurse when you get to Short Stay.  WHAT DO I DO ABOUT MY DIABETES MEDICATION?  Do not take MetFORMIN (GLUCOPHAGE-XR) the  morning of surgery.     THE MORNING OF SURGERY, take ______15_______ units of ____Lantus______insulin.  If your CBG is greater than 220 mg/dL, call the number above for further instructions.  Reviewed and Endorsed by [facility] Patient Education Committee, August 2015          Do not wear jewelry. Do not wear lotions, powders, cologne or deodorant. Do not shave 48 hours prior to surgery.  Men may shave face and neck. Do not bring valuables to the hospital.  [facility] is not responsible for any belongings or valuables.    Do NOT Smoke (Tobacco/Vaping)  24 hours prior to your procedure  If you use a CPAP at night, you may bring your mask for your overnight stay.   Contacts, glasses, hearing aids, dentures or partials may not be worn into surgery, please bring cases for these belongings   For patients admitted to the hospital, discharge time will be determined by your treatment team.   Patients discharged the day of surgery will not be allowed to drive home, and someone needs to stay with them for 24 hours.  Special instructions:    Oral Hygiene is also important to reduce your risk of infection.  Remember - BRUSH YOUR TEETH THE MORNING OF SURGERY WITH YOUR REGULAR TOOTHPASTE  [facility]- Preparing For Surgery  Before surgery, you can play an important role. Because skin is  not sterile, your skin needs to be as free of germs as possible. You can reduce the number of germs on your skin by washing with Antibacterial Soap before surgery.     Please follow these instructions carefully.     Shower the NIGHT BEFORE SURGERY and the MORNING OF SURGERY with Antibacterial Soap.   Pat yourself dry with a CLEAN TOWEL.  Wear CLEAN PAJAMAS to bed the night before surgery  Place CLEAN SHEETS on your bed the night before your surgery  DO NOT SLEEP WITH PETS.  Day of Surgery:  Take a shower with Antibacterial soap. Wear Clean/Comfortable clothing the morning of surgery Do not apply  any deodorants/lotions.   Remember to brush your teeth WITH YOUR REGULAR TOOTHPASTE.   If you test positive for Covid, or been in contact with anyone that has tested positive in the last 10 days, please notify your surgeon.  SURGICAL WAITING ROOM VISITATION Patients having surgery or a procedure may have no more than 2 support people in the waiting area - these visitors may rotate.   Children under the age of 29 must have an adult with them who is not the patient. If the patient needs to stay at the hospital during part of their recovery, the visitor guidelines for inpatient rooms apply. Pre-op nurse will coordinate an appropriate time for 1 support person to accompany patient in pre-op.  This support person may not rotate.   Please refer to the [facility] website for the visitor guidelines for Inpatients (after your surgery is over and you are in a regular room).

## 2022-06-10 NOTE — Progress Notes (Signed)
PCP - Dr. Hart Carwin  Cardiologist - Denies  EP- Denies  Endocrine- Denies  Pulm- Denies  Chest x-ray - Denies  EKG - 06/11/22- Day of surgery  Stress Test - Denies  ECHO - 04/30/2018 (E)  Cardiac Cath - Denies  AICD-na PM-na LOOP-na  Nerve Stimulator- Denies  Dialysis- Denies  Sleep Study - Denies CPAP - Denies  LABS- 06/11/22: CBC, BMP  ASA-LD- 2/13 PLETAL- LD- 2/13  ERAS- No  HA1C- 01/30/22(E): 8.7 Fasting Blood Sugar - 110-200 Checks Blood Sugar ___4__ times a week  Anesthesia- No  Pt denies having chest pain, sob, or fever during the pre-op phone call. All instructions explained to the pt, with a verbal understanding of the material. Pt also instructed to wear a mask and social distance if he goes out. The opportunity to ask questions was provided.

## 2022-06-11 ENCOUNTER — Encounter (HOSPITAL_COMMUNITY): Payer: Self-pay | Admitting: Optometry

## 2022-06-11 ENCOUNTER — Other Ambulatory Visit: Payer: Self-pay

## 2022-06-11 ENCOUNTER — Ambulatory Visit (HOSPITAL_BASED_OUTPATIENT_CLINIC_OR_DEPARTMENT_OTHER): Payer: Medicaid Other | Admitting: Anesthesiology

## 2022-06-11 ENCOUNTER — Ambulatory Visit (HOSPITAL_COMMUNITY): Payer: Medicaid Other | Admitting: Anesthesiology

## 2022-06-11 ENCOUNTER — Encounter (HOSPITAL_COMMUNITY)
Admission: RE | Disposition: A | Discharge status: Home / Self Care | Payer: Self-pay | Source: Home / Self Care | Attending: Optometry

## 2022-06-11 ENCOUNTER — Ambulatory Visit (HOSPITAL_COMMUNITY)
Admission: RE | Admit: 2022-06-11 | Discharge: 2022-06-11 | Disposition: A | Discharge status: Home / Self Care | Payer: Medicaid Other | Attending: Optometry | Admitting: Optometry

## 2022-06-11 DIAGNOSIS — I10 Essential (primary) hypertension: Secondary | ICD-10-CM | POA: Insufficient documentation

## 2022-06-11 DIAGNOSIS — Z794 Long term (current) use of insulin: Secondary | ICD-10-CM | POA: Insufficient documentation

## 2022-06-11 DIAGNOSIS — H5711 Ocular pain, right eye: Secondary | ICD-10-CM | POA: Diagnosis not present

## 2022-06-11 DIAGNOSIS — E1151 Type 2 diabetes mellitus with diabetic peripheral angiopathy without gangrene: Secondary | ICD-10-CM | POA: Diagnosis not present

## 2022-06-11 DIAGNOSIS — Z8673 Personal history of transient ischemic attack (TIA), and cerebral infarction without residual deficits: Secondary | ICD-10-CM | POA: Diagnosis not present

## 2022-06-11 DIAGNOSIS — F418 Other specified anxiety disorders: Secondary | ICD-10-CM

## 2022-06-11 DIAGNOSIS — H5789 Other specified disorders of eye and adnexa: Secondary | ICD-10-CM | POA: Diagnosis not present

## 2022-06-11 DIAGNOSIS — H5461 Unqualified visual loss, right eye, normal vision left eye: Secondary | ICD-10-CM | POA: Diagnosis not present

## 2022-06-11 DIAGNOSIS — Z7984 Long term (current) use of oral hypoglycemic drugs: Secondary | ICD-10-CM | POA: Diagnosis not present

## 2022-06-11 DIAGNOSIS — F17210 Nicotine dependence, cigarettes, uncomplicated: Secondary | ICD-10-CM

## 2022-06-11 DIAGNOSIS — H5440 Blindness, one eye, unspecified eye: Secondary | ICD-10-CM | POA: Diagnosis not present

## 2022-06-11 DIAGNOSIS — Z9001 Acquired absence of eye: Secondary | ICD-10-CM

## 2022-06-11 HISTORY — DX: Acquired absence of eye: Z90.01

## 2022-06-11 HISTORY — PX: EVISCERATION: SHX1539

## 2022-06-11 LAB — BASIC METABOLIC PANEL
Anion gap: 13 (ref 5–15)
BUN: 7 mg/dL (ref 6–20)
CO2: 28 mmol/L (ref 22–32)
Calcium: 9.1 mg/dL (ref 8.9–10.3)
Chloride: 98 mmol/L (ref 98–111)
Creatinine, Ser: 0.89 mg/dL (ref 0.61–1.24)
GFR, Estimated: >60 mL/min (ref >60)
Glucose, Bld: 132 mg/dL — ABNORMAL HIGH (ref 70–99)
Potassium: 3.9 mmol/L (ref 3.5–5.1)
Sodium: 139 mmol/L (ref 135–145)

## 2022-06-11 LAB — CBC
HCT: 38.1 % — ABNORMAL LOW (ref 39.0–52.0)
Hemoglobin: 12.1 g/dL — ABNORMAL LOW (ref 13.0–17.0)
MCH: 19.5 pg — ABNORMAL LOW (ref 26.0–34.0)
MCHC: 31.8 g/dL (ref 30.0–36.0)
MCV: 61.5 fL — ABNORMAL LOW (ref 80.0–100.0)
Platelets: 206 10*3/uL (ref 150–400)
RBC: 6.2 MIL/uL — ABNORMAL HIGH (ref 4.22–5.81)
RDW: 18.3 % — ABNORMAL HIGH (ref 11.5–15.5)
WBC: 11.3 10*3/uL — ABNORMAL HIGH (ref 4.0–10.5)
nRBC: 0 % (ref 0.0–0.2)

## 2022-06-11 LAB — GLUCOSE, CAPILLARY
Glucose-Capillary: 126 mg/dL — ABNORMAL HIGH (ref 70–99)
Glucose-Capillary: 147 mg/dL — ABNORMAL HIGH (ref 70–99)
Glucose-Capillary: 87 mg/dL (ref 70–99)
Glucose-Capillary: 117 mg/dL — ABNORMAL HIGH (ref 70–99)

## 2022-06-11 SURGERY — REPAIR, EVISCERATION, ABDOMEN
Anesthesia: General | Site: Eye | Laterality: Right

## 2022-06-11 MED ORDER — LIDOCAINE-EPINEPHRINE 1 %-1:100000 IJ SOLN
INTRAMUSCULAR | Status: AC
  Filled 2022-06-11: qty 1

## 2022-06-11 MED ORDER — HYDROMORPHONE HCL 1 MG/ML IJ SOLN: 0.2500 mg | INTRAMUSCULAR | Status: DC | PRN

## 2022-06-11 MED ORDER — OXYCODONE HCL 5 MG PO TABS
ORAL_TABLET | ORAL | Status: AC
  Filled 2022-06-11: qty 1

## 2022-06-11 MED ORDER — OXYCODONE HCL 5 MG/5ML PO SOLN: 5.0000 mg | Freq: Once | ORAL | Status: AC | PRN

## 2022-06-11 MED ORDER — OXYCODONE HCL 5 MG PO TABS
5.0000 mg | ORAL_TABLET | Freq: Once | ORAL | Status: AC | PRN
  Administered 2022-06-11: 5 mg via ORAL

## 2022-06-11 MED ORDER — MIDAZOLAM HCL 2 MG/2ML IJ SOLN
INTRAMUSCULAR | Status: AC
  Filled 2022-06-11: qty 2

## 2022-06-11 MED ORDER — LABETALOL HCL 5 MG/ML IV SOLN
INTRAVENOUS | Status: DC | PRN
  Administered 2022-06-11: 2.5 mg via INTRAVENOUS

## 2022-06-11 MED ORDER — NEOMYCIN-POLYMYXIN-DEXAMETH 3.5-10000-0.1 OP OINT
TOPICAL_OINTMENT | OPHTHALMIC | Status: DC | PRN
  Administered 2022-06-11: 1 via OPHTHALMIC

## 2022-06-11 MED ORDER — ALCOHOL (ABLYSINOL) 99% IA SOLN
0.1000 mL | Freq: Once | INTRA_ARTERIAL | Status: DC
  Filled 2022-06-11 (×2): qty 5

## 2022-06-11 MED ORDER — KETOROLAC TROMETHAMINE 30 MG/ML IJ SOLN
INTRAMUSCULAR | Status: DC | PRN
  Administered 2022-06-11: 30 mg via INTRAVENOUS

## 2022-06-11 MED ORDER — BSS IO SOLN
INTRAOCULAR | Status: AC
  Filled 2022-06-11: qty 15

## 2022-06-11 MED ORDER — SUGAMMADEX SODIUM 200 MG/2ML IV SOLN
INTRAVENOUS | Status: DC | PRN
  Administered 2022-06-11: 200 mg via INTRAVENOUS

## 2022-06-11 MED ORDER — ROCURONIUM BROMIDE 10 MG/ML (PF) SYRINGE
PREFILLED_SYRINGE | INTRAVENOUS | Status: AC
  Filled 2022-06-11: qty 10

## 2022-06-11 MED ORDER — LACTATED RINGERS IV SOLN: INTRAVENOUS | Status: DC

## 2022-06-11 MED ORDER — CHLORHEXIDINE GLUCONATE 0.12 % MT SOLN
15.0000 mL | Freq: Once | OROMUCOSAL | Status: AC
  Administered 2022-06-11: 15 mL via OROMUCOSAL
  Filled 2022-06-11: qty 15

## 2022-06-11 MED ORDER — ONDANSETRON HCL 4 MG/2ML IJ SOLN
INTRAMUSCULAR | Status: AC
  Filled 2022-06-11: qty 2

## 2022-06-11 MED ORDER — ERYTHROMYCIN 5 MG/GM OP OINT
TOPICAL_OINTMENT | Freq: Once | OPHTHALMIC | Status: DC
  Filled 2022-06-11: qty 3.5
  Filled 2022-06-11: qty 1

## 2022-06-11 MED ORDER — FENTANYL CITRATE (PF) 250 MCG/5ML IJ SOLN
INTRAMUSCULAR | Status: DC | PRN
  Administered 2022-06-11: 50 ug via INTRAVENOUS
  Administered 2022-06-11: 100 ug via INTRAVENOUS
  Administered 2022-06-11 (×2): 50 ug via INTRAVENOUS

## 2022-06-11 MED ORDER — BUPIVACAINE HCL (PF) 0.75 % IJ SOLN
INTRAMUSCULAR | Status: DC | PRN
  Administered 2022-06-11 (×2): 2 mL

## 2022-06-11 MED ORDER — ACETAMINOPHEN 500 MG PO TABS
1000.0000 mg | ORAL_TABLET | Freq: Once | ORAL | Status: AC
  Administered 2022-06-11: 1000 mg via ORAL
  Filled 2022-06-11: qty 2

## 2022-06-11 MED ORDER — ERYTHROMYCIN 5 MG/GM OP OINT
TOPICAL_OINTMENT | OPHTHALMIC | Status: AC
  Filled 2022-06-11: qty 3.5

## 2022-06-11 MED ORDER — PROPOFOL 10 MG/ML IV BOLUS
INTRAVENOUS | Status: DC | PRN
  Administered 2022-06-11: 50 mg via INTRAVENOUS
  Administered 2022-06-11: 150 mg via INTRAVENOUS
  Administered 2022-06-11: 50 mg via INTRAVENOUS

## 2022-06-11 MED ORDER — BUPIVACAINE HCL (PF) 0.75 % IJ SOLN
INTRAMUSCULAR | Status: AC
  Filled 2022-06-11: qty 10

## 2022-06-11 MED ORDER — ROCURONIUM BROMIDE 10 MG/ML (PF) SYRINGE
PREFILLED_SYRINGE | INTRAVENOUS | Status: DC | PRN
  Administered 2022-06-11: 60 mg via INTRAVENOUS

## 2022-06-11 MED ORDER — ONDANSETRON HCL 4 MG/2ML IJ SOLN: 4.0000 mg | Freq: Once | INTRAMUSCULAR | Status: DC | PRN

## 2022-06-11 MED ORDER — NEOMYCIN-POLYMYXIN-DEXAMETH 3.5-10000-0.1 OP OINT
TOPICAL_OINTMENT | OPHTHALMIC | Status: AC
  Filled 2022-06-11: qty 3.5

## 2022-06-11 MED ORDER — LABETALOL HCL 5 MG/ML IV SOLN
INTRAVENOUS | Status: AC
  Filled 2022-06-11: qty 4

## 2022-06-11 MED ORDER — ORAL CARE MOUTH RINSE: 15.0000 mL | Freq: Once | OROMUCOSAL | Status: AC

## 2022-06-11 MED ORDER — FENTANYL CITRATE (PF) 250 MCG/5ML IJ SOLN
INTRAMUSCULAR | Status: AC
  Filled 2022-06-11: qty 5

## 2022-06-11 MED ORDER — OXYCODONE-ACETAMINOPHEN 5-325 MG PO TABS: 1.0000 | ORAL_TABLET | ORAL | 0 refills | Status: DC | PRN

## 2022-06-11 MED ORDER — BSS IO SOLN
INTRAOCULAR | Status: DC | PRN
  Administered 2022-06-11: 15 mL via INTRAOCULAR

## 2022-06-11 MED ORDER — ALCOHOL 98 % IJ SOLN
INTRAMUSCULAR | Status: DC | PRN
  Administered 2022-06-11: 5 mL

## 2022-06-11 MED ORDER — LIDOCAINE-EPINEPHRINE 1 %-1:100000 IJ SOLN
INTRAMUSCULAR | Status: DC | PRN
  Administered 2022-06-11 (×2): 2 mL

## 2022-06-11 MED ORDER — DEXAMETHASONE SODIUM PHOSPHATE 10 MG/ML IJ SOLN
INTRAMUSCULAR | Status: DC | PRN
  Administered 2022-06-11: 5 mg via INTRAVENOUS

## 2022-06-11 MED ORDER — ONDANSETRON HCL 4 MG/2ML IJ SOLN
INTRAMUSCULAR | Status: DC | PRN
  Administered 2022-06-11: 4 mg via INTRAVENOUS

## 2022-06-11 MED ORDER — MIDAZOLAM HCL 2 MG/2ML IJ SOLN
INTRAMUSCULAR | Status: DC | PRN
  Administered 2022-06-11: 2 mg via INTRAVENOUS

## 2022-06-11 MED ORDER — SODIUM CHLORIDE 0.9 % IV SOLN: INTRAVENOUS | Status: DC

## 2022-06-11 MED ORDER — AMISULPRIDE (ANTIEMETIC) 5 MG/2ML IV SOLN: 10.0000 mg | Freq: Once | INTRAVENOUS | Status: DC | PRN

## 2022-06-11 MED ORDER — DEXAMETHASONE SODIUM PHOSPHATE 10 MG/ML IJ SOLN
INTRAMUSCULAR | Status: AC
  Filled 2022-06-11: qty 1

## 2022-06-11 MED ORDER — INSULIN ASPART 100 UNIT/ML IJ SOLN
0.0000 [IU] | INTRAMUSCULAR | Status: DC | PRN
  Administered 2022-06-11: 2 [IU] via SUBCUTANEOUS
  Filled 2022-06-11: qty 1

## 2022-06-11 MED ORDER — PROPOFOL 10 MG/ML IV BOLUS
INTRAVENOUS | Status: AC
  Filled 2022-06-11: qty 20

## 2022-06-11 SURGICAL SUPPLY — 52 items
APPLICATOR COTTON TIP 6 STRL (MISCELLANEOUS) ×1 IMPLANT
APPLICATOR COTTON TIP 6IN STRL (MISCELLANEOUS) ×1
APPLICATOR DR MATTHEWS STRL (MISCELLANEOUS) IMPLANT
BAG COUNTER SPONGE SURGICOUNT (BAG) ×1 IMPLANT
BENZOIN TINCTURE PRP APPL 2/3 (GAUZE/BANDAGES/DRESSINGS) IMPLANT
BLADE SURG 15 STRL LF DISP TIS (BLADE) ×1 IMPLANT
BLADE SURG 15 STRL SS (BLADE) ×1
BNDG EYE OVAL (GAUZE/BANDAGES/DRESSINGS) IMPLANT
CLSR STERI-STRIP ANTIMIC 1/2X4 (GAUZE/BANDAGES/DRESSINGS) IMPLANT
CONFORMER OPHTHALMIC LG W/HOLE (MISCELLANEOUS) IMPLANT
CORD BIPOLAR FORCEPS 12FT (ELECTRODE) ×1 IMPLANT
COVER SURGICAL LIGHT HANDLE (MISCELLANEOUS) ×1 IMPLANT
DRAPE ORTHO SPLIT 77X108 STRL (DRAPES) ×1
DRAPE SURG ORHT 6 SPLT 77X108 (DRAPES) ×1 IMPLANT
DRSG MEPITEL 3X4 ME34 (GAUZE/BANDAGES/DRESSINGS) IMPLANT
DRSG TELFA 3X8 NADH STRL (GAUZE/BANDAGES/DRESSINGS) IMPLANT
ELECT NDL BLADE 2-5/6 (NEEDLE) IMPLANT
ELECT NEEDLE BLADE 2-5/6 (NEEDLE) IMPLANT
ELECT REM PT RETURN 9FT ADLT (ELECTROSURGICAL) ×1
ELECTRODE REM PT RTRN 9FT ADLT (ELECTROSURGICAL) ×1 IMPLANT
FORCEPS BIPOLAR SPETZLER 8 1.0 (NEUROSURGERY SUPPLIES) ×1 IMPLANT
FRAME EYE SHIELD (PROTECTIVE WEAR) IMPLANT
GAUZE SPONGE 4X4 12PLY STRL (GAUZE/BANDAGES/DRESSINGS) IMPLANT
GLOVE SS BIOGEL STRL SZ 7.5 (GLOVE) ×1 IMPLANT
GOWN STRL REUS W/ TWL LRG LVL3 (GOWN DISPOSABLE) ×2 IMPLANT
GOWN STRL REUS W/TWL LRG LVL3 (GOWN DISPOSABLE) ×2
IMPL MEDPOR SPHERE EYE 22MM (Ophthalmic Related) IMPLANT
IMPLANT MEDPOR SPHERE EYE 22MM (Ophthalmic Related) ×1 IMPLANT
KIT BASIN OR (CUSTOM PROCEDURE TRAY) ×1 IMPLANT
MARKER SKIN DUAL TIP RULER LAB (MISCELLANEOUS) ×1 IMPLANT
NDL PRECISIONGLIDE 27X1.5 (NEEDLE) IMPLANT
NEEDLE PRECISIONGLIDE 27X1.5 (NEEDLE) IMPLANT
NS IRRIG 1000ML POUR BTL (IV SOLUTION) ×1 IMPLANT
PACK CATARACT CUSTOM (CUSTOM PROCEDURE TRAY) ×1 IMPLANT
PAD ARMBOARD 7.5X6 YLW CONV (MISCELLANEOUS) ×2 IMPLANT
PATTIES SURGICAL .5 X3 (DISPOSABLE) IMPLANT
PENCIL BUTTON HOLSTER BLD 10FT (ELECTRODE) IMPLANT
PROTECTOR CORNEAL (OPHTHALMIC RELATED) IMPLANT
SET INTBT LACRIMAL .016X.025 (DRAIN) IMPLANT
SUT CHROMIC 5 0 P 3 (SUTURE) IMPLANT
SUT ETHILON 6 0 P 1 (SUTURE) IMPLANT
SUT ETHILON 7 0 P 6 18 (SUTURE) IMPLANT
SUT GUT PLAIN 6-0 1X18 ABS (SUTURE) IMPLANT
SUT MERSILENE 5 0 RD 1 DA (SUTURE) IMPLANT
SUT PLAIN 5 0 P 3 18 (SUTURE) IMPLANT
SUT SILK 4 0 P 3 (SUTURE) IMPLANT
SUT VIC AB 4-0 P-3 18XBRD (SUTURE) IMPLANT
SUT VIC AB 4-0 P3 18 (SUTURE) ×1
SUT VICRYL 6 0 S 14 UNDY (SUTURE) IMPLANT
SUT VICRYL 6-0 S14 (SUTURE) IMPLANT
TOWEL GREEN STERILE FF (TOWEL DISPOSABLE) ×1 IMPLANT
WATER STERILE IRR 1000ML POUR (IV SOLUTION) ×1 IMPLANT

## 2022-06-11 NOTE — Anesthesia Procedure Notes (Signed)
Procedure Name: Intubation Date/Time: 06/11/2022 1:39 PM  Performed by: Heide Scales, CRNAPre-anesthesia Checklist: Patient identified, Emergency Drugs available, Suction available and Patient being monitored Patient Re-evaluated:Patient Re-evaluated prior to induction Oxygen Delivery Method: Circle system utilized Preoxygenation: Pre-oxygenation with 100% oxygen Induction Type: IV induction Ventilation: Mask ventilation without difficulty Laryngoscope Size: Mac and 4 Grade View: Grade II Tube type: Oral Tube size: 7.5 mm Number of attempts: 1 Airway Equipment and Method: Stylet and Oral airway Placement Confirmation: ETT inserted through vocal cords under direct vision, positive ETCO2 and breath sounds checked- equal and bilateral Secured at: 22 cm Tube secured with: Tape Dental Injury: Teeth and Oropharynx as per pre-operative assessment

## 2022-06-11 NOTE — Anesthesia Preprocedure Evaluation (Addendum)
Anesthesia Evaluation  Patient identified by MRN, date of birth, ID band Patient awake    Reviewed: Allergy & Precautions, NPO status , Patient's Chart, lab work & pertinent test results  Airway Mallampati: II  TM Distance: >3 FB Neck ROM: Full    Dental  (+) Teeth Intact, Dental Advisory Given   Pulmonary Current Smoker and Patient abstained from smoking. 8 pack year history   Pulmonary exam normal breath sounds clear to auscultation       Cardiovascular hypertension (132/72 preop), Pt. on medications + Peripheral Vascular Disease  Normal cardiovascular exam Rhythm:Regular Rate:Normal  Echo 2020 - Left ventricle: The cavity size was normal. Systolic function was    normal. The estimated ejection fraction was in the range of 60%    to 65%. Wall motion was normal; there were no regional wall    motion abnormalities. Left ventricular diastolic function    parameters were normal.  - Mitral valve: There was trivial regurgitation.     Neuro/Psych  PSYCHIATRIC DISORDERS Anxiety Depression    CVA, No Residual Symptoms    GI/Hepatic negative GI ROS,,,(+)     substance abuse  marijuana use  Endo/Other  diabetes, Well Controlled, Type 2, Insulin Dependent, Oral Hypoglycemic Agents    Renal/GU negative Renal ROS  negative genitourinary   Musculoskeletal negative musculoskeletal ROS (+)    Abdominal   Peds  Hematology negative hematology ROS (+) Hb 12.1   Anesthesia Other Findings   Reproductive/Obstetrics negative OB ROS                             Anesthesia Physical Anesthesia Plan  ASA: 3  Anesthesia Plan: General   Post-op Pain Management: Tylenol PO (pre-op)*   Induction: Intravenous  PONV Risk Score and Plan: 1 and Ondansetron, Dexamethasone and Treatment may vary due to age or medical condition  Airway Management Planned: Oral ETT  Additional Equipment: None  Intra-op Plan:    Post-operative Plan: Extubation in OR  Informed Consent: I have reviewed the patients History and Physical, chart, labs and discussed the procedure including the risks, benefits and alternatives for the proposed anesthesia with the patient or authorized representative who has indicated his/her understanding and acceptance.     Dental advisory given  Plan Discussed with: CRNA  Anesthesia Plan Comments:         Anesthesia Quick Evaluation

## 2022-06-11 NOTE — Op Note (Signed)
Operative Note PATIENT NAME:  [facility]: [facility] MRN:  [id number] CSN: [id number] DATE OF SERVICE:  06/11/2022 DATE OF BIRTH:  September 27, 1975  PREOPERATIVE DIAGNOSIS:   1. Blind, painful eye right  POSTOPERATIVE DIAGNOSIS:  same  PROCEDURE(S) PERFORMED:  1. Evisceration of eye right CPT 65105  SURGEON:  Delia Chimes, MD, PhD ASSISTANT:  none  ANESTHESIA:  General  FLUIDS GIVEN: Per Anesthesia   ESTIMATED BLOOD LOSS:  15 cc  TUBES/DRAINS:  None  SPECIMENS/CULTURES: Right eye contents  IMPLANTS:  Implant Name Type Inv. Item Serial No. Manufacturer Lot No. LRB No. Used Action  IMPLANT MEDPOR SPHERE EYE 22MM - [id number] Ophthalmic Related IMPLANT MEDPOR SPHERE EYE 22MM  Durenda Age [id number] Right 1 Implanted   COMPLICATIONS:  None  DESCRIPTION OF PROCEDURE: After obtaining informed consent, the patient was taken back to the operating room and laid supine on the operating room table.  Under monitored anesthesia care, the operative site(s) were anesthetized with 2% lidocaine with epinephrine and bupivicaine.  The nares were packed with Afrin-soaked pledgets.  The patient was then prepped and draped in the usual sterile fashion.   Attention was directed to the right eye.  A lid speculum was placed.  A cloudy cornea was observed.  Previously in the clinic, an ultrasound had been performed to confirm that there were no masses in the right eye.  Approximately 1 cc of local anesthetic was infiltrated in the subconjunctival space 360 degrees around the conjunctiva.  A 360 degree conjunctival peritomy was then made.  An 11 blade was used to start an incision in the sclera just behind the cornea scleral limbus.  The sclera and cornea were then removed using Wescott scissors.  An evisceration spoon was then inserted in the supraorbital space and the ocular contents removed.  The anterior of the eye was then rinsed with copious amounts of sterile water.   Next, cotton tip applicators soaked in [id number] ethanol were then used to debride the inner portion of the sclera to denature any remaining uvea.  The eye was again rinsed.  Relaxing incisions were made around the optic nerve and in the superonasal and inferotemporal quadrants of the posterior sclera.  Bipolar cautery was used for hemostasis.  The anterior sclera was then incised in the inferonasal and superotemporal quadrants.  A 22 mm orbital sizer was then placed and determined to be the appropriate size.  The orbital implant was then prepared and inserted using the inserter.  The posterior sclera was closed with 4-0 Vicryl suture.  The anterior sclera was closed using 4-0 Vicryl suture.  The tenons were then reapproximated using 6-0 Vicryl suture.  The conjunctiva was closed using 6-0 plain gut suture.  A large conformer with Maxitrol ointment was then placed above the operative site.  A temporary tarsorrhaphy was then placed approximately in the gray line of the upper and lower eyelids with 5-0 nylon suture tied down over bolsters.  Additional max drawing was placed around the incisions.  A retrobulbar block was then performed, infiltrating approximately 2 cc of local anesthetic in the intraconal space.  A pressure patch was placed over the operative eye with two eye pads and taped over with an eye shield.  The patient will keep the eye shield on until follow-up in the clinic.  The patient was then taken to the recovery room in stable condition.   Delia Chimes, MD, PhD Ophthalmic Plastic and Reconstructive Surgery [facility] [phone] (office)

## 2022-06-11 NOTE — Interval H&P Note (Signed)
History and Physical Interval Note:  06/11/2022 1:19 PM  Frank Schneider  has presented today for surgery, with the diagnosis of BLIND PAINFUL RIGHT EYE.  The various methods of treatment have been discussed with the patient and family. After consideration of risks, benefits and other options for treatment, the patient has consented to  Procedure(s): [place] (Right) as a surgical intervention.  The patient's history has been reviewed, patient examined, no change in status, stable for surgery.  I have reviewed the patient's chart and labs.  Questions were answered to the patient's satisfaction.     Delia Chimes

## 2022-06-11 NOTE — Transfer of Care (Signed)
Immediate Anesthesia Transfer of Care Note  Patient: Frank Schneider  Procedure(s) Performed: EVISCERATION REPAIR WITH POSSIBLE INCLUSION (Right: Eye)  Patient Location: PACU  Anesthesia Type:General  Level of Consciousness: drowsy  Airway & Oxygen Therapy: Patient Spontanous Breathing and Patient connected to nasal cannula oxygen  Post-op Assessment: Report given to RN and Post -op Vital signs reviewed and stable  Post vital signs: Reviewed and stable  Last Vitals:  Vitals Value Taken Time  BP 165/71 06/11/22 1547  Temp    Pulse 60 06/11/22 1550  Resp 11 06/11/22 1550  SpO2 95 % 06/11/22 1550  Vitals shown include unvalidated device data.  Last Pain:  Vitals:   06/11/22 1116  TempSrc:   PainSc: 0-No pain      Patients Stated Pain Goal: 2 ([id number])  Complications: No notable events documented.

## 2022-06-12 ENCOUNTER — Encounter (HOSPITAL_COMMUNITY): Payer: Self-pay | Admitting: Optometry

## 2022-06-12 ENCOUNTER — Other Ambulatory Visit: Payer: Self-pay | Admitting: Nurse Practitioner

## 2022-06-12 DIAGNOSIS — M545 Low back pain, unspecified: Secondary | ICD-10-CM

## 2022-06-12 MED ORDER — IBUPROFEN 800 MG PO TABS: ORAL_TABLET | ORAL | 0 refills | Status: DC

## 2022-06-12 NOTE — Anesthesia Postprocedure Evaluation (Signed)
Anesthesia Post Note  Patient: Air traffic controller  Procedure(s) Performed: EVISCERATION REPAIR WITH POSSIBLE INCLUSION (Right: Eye)     Patient location during evaluation: PACU Anesthesia Type: General Level of consciousness: awake Pain management: pain level controlled Vital Signs Assessment: post-procedure vital signs reviewed and stable Respiratory status: spontaneous breathing, nonlabored ventilation and respiratory function stable Cardiovascular status: blood pressure returned to baseline and stable Postop Assessment: no apparent nausea or vomiting Anesthetic complications: no   No notable events documented.  Last Vitals:  Vitals:   06/11/22 1630 06/11/22 1645  BP: (!) 154/68 (!) 148/69  Pulse: 62 (!) 57  Resp: 12 11  Temp:    SpO2: 96% 96%    Last Pain:  Vitals:   06/11/22 1630  TempSrc:   PainSc: 0-No pain                 Yasmina [place] P Luree Palla

## 2022-06-12 NOTE — Telephone Encounter (Signed)
Could you please check on referral to home health. Thanks.

## 2022-06-15 LAB — SURGICAL PATHOLOGY

## 2022-06-17 ENCOUNTER — Ambulatory Visit: Payer: Self-pay | Admitting: Nurse Practitioner

## 2022-06-18 ENCOUNTER — Telehealth: Payer: Self-pay | Admitting: Clinical

## 2022-06-19 ENCOUNTER — Ambulatory Visit: Payer: Self-pay | Admitting: Nurse Practitioner

## 2022-06-22 ENCOUNTER — Ambulatory Visit (INDEPENDENT_AMBULATORY_CARE_PROVIDER_SITE_OTHER): Payer: Medicaid Other | Admitting: Nurse Practitioner

## 2022-06-22 ENCOUNTER — Ambulatory Visit: Payer: Self-pay | Admitting: Nurse Practitioner

## 2022-06-22 ENCOUNTER — Encounter: Payer: Self-pay | Admitting: Nurse Practitioner

## 2022-06-22 VITALS — BP 126/81 | HR 80 | Temp 97.0°F | Wt 174.0 lb

## 2022-06-22 DIAGNOSIS — F419 Anxiety disorder, unspecified: Secondary | ICD-10-CM

## 2022-06-22 DIAGNOSIS — I10 Essential (primary) hypertension: Secondary | ICD-10-CM | POA: Diagnosis not present

## 2022-06-22 DIAGNOSIS — I63311 Cerebral infarction due to thrombosis of right middle cerebral artery: Secondary | ICD-10-CM | POA: Diagnosis not present

## 2022-06-22 DIAGNOSIS — E1165 Type 2 diabetes mellitus with hyperglycemia: Secondary | ICD-10-CM | POA: Diagnosis not present

## 2022-06-22 DIAGNOSIS — G629 Polyneuropathy, unspecified: Secondary | ICD-10-CM | POA: Diagnosis not present

## 2022-06-22 DIAGNOSIS — F32A Depression, unspecified: Secondary | ICD-10-CM

## 2022-06-22 MED ORDER — LANTUS SOLOSTAR 100 UNIT/ML ~~LOC~~ SOPN: 30.0000 [IU] | PEN_INJECTOR | Freq: Every day | SUBCUTANEOUS | 0 refills | Status: DC

## 2022-06-22 MED ORDER — TRUEPLUS LANCETS 33G MISC: 1.0000 [IU] | Freq: Two times a day (BID) | 2 refills | Status: DC

## 2022-06-22 MED ORDER — GLUCOSE BLOOD VI STRP: ORAL_STRIP | 12 refills | Status: DC

## 2022-06-22 MED ORDER — TRUE METRIX AIR GLUCOSE METER DEVI: 1.0000 [IU] | Freq: Two times a day (BID) | 0 refills | Status: DC

## 2022-06-22 NOTE — Telephone Encounter (Signed)
[facility] General Follow Up Note  06/18/22 Name: [place] MRN: [id number] DOB: 1975/06/26 Kyce Steber is a 47 y.o. year old male who sees [name], NP for primary care. LCSW was initially consulted to assist with mental health concerns and social needs.  Interpreter: No.   Interpreter Name & Language: none  Assessment: Patient experiencing mental health concerns and other social/environmental needs.   Ongoing Intervention: Patient called and asked for assistance with transportation to upcoming appointments. Did schedule transportation with [facility] to vascular appointment 06/23/22. There was not enough notice to schedule ride for PCP appointment, so arranged taxi with [facility].  Review of patient status, including review of consultants reports, relevant laboratory and other test results, and collaboration with appropriate care team members and the patient's provider was performed as part of comprehensive patient evaluation and provision of services.    [name], [facility] [phone]

## 2022-06-22 NOTE — Assessment & Plan Note (Signed)
-   Microalbumin / creatinine urine ratio - Blood Glucose Monitoring Suppl (TRUE METRIX AIR GLUCOSE METER) DEVI; 1 Units by Does not apply route 2 (two) times daily.  Dispense: 1 each; Refill: 0 - glucose blood test strip; Use as instructed  Dispense: 100 each; Refill: 12 - TRUEplus Lancets 33G MISC; 1 Units by Does not apply route in the morning and at bedtime.  Dispense: 100 each; Refill: 2 - insulin glargine (LANTUS SOLOSTAR) 100 UNIT/ML Solostar Pen; Inject 30 Units into the skin at bedtime.  Dispense: 15 mL; Refill: 0 - CBC - Comprehensive metabolic panel - Lipid Panel - Hemoglobin A1c - AMB Referral to Pharmacy Medication Management - Ambulatory referral to Home Health  2. Cerebrovascular accident (CVA) due to thrombosis of right middle cerebral artery ([place])  - Ambulatory referral to Home Health  3. Essential hypertension  - Ambulatory referral to Home Health  4. Anxiety and depression  - Ambulatory referral to Home Health  5. Neuropathy  - Ambulatory referral to Home Health  Follow up:  Follow up in 3 months or sooner if needed

## 2022-06-22 NOTE — Progress Notes (Signed)
$'@Patient'e$  ID: Frank Schneider, male    DOB: [birth year], 47 y.o.   MRN: [id number]  Chief Complaint  Patient presents with   Referral    Home health     Referring provider: [name], NP   HPI  Frank Schneider presents for foot pain. He  has a past medical history of Blind right eye, Diabetes mellitus, History of appendicitis (11/2015), Hypertension, Left flank pain (12/2019), Priapism, Stroke ([place]) (04/2015), Tobacco use, Vision abnormalities, and Vitamin D deficiency (12/2019).    Patient presents today due to need for home health aide.  Patient has recently had removal of his right eye.  He also has a history of CVA, hypertension, neuropathy, depression, diabetes.  Patient would benefit from home health aide due to the fact that he is unable to perform ADLs independently.  He does currently live alone and is struggling.  He does need help sorting his medications as well. Denies f/c/s, n/v/d, hemoptysis, PND, leg swelling Denies chest pain or edema      No Known Allergies  Immunization History  Administered Date(s) Administered   Influenza,inj,Quad PF,6+ Mos 01/15/2015   PFIZER(Purple Top)SARS-COV-2 Vaccination 01/03/2020   Pneumococcal Polysaccharide-23 07/29/2011   Tdap 01/15/2015    Past Medical History:  Diagnosis Date   Blind right eye    Diabetes mellitus    Takes Metformin   History of appendicitis 11/2015   Hypertension    Left flank pain 12/2019   Priapism    Stroke ([place]) 04/2015   Tobacco use    Vision abnormalities    Vitamin D deficiency 12/2019    Tobacco History: Social History   Tobacco Use  Smoking Status Some Days   Packs/day: 1.00   Years: 8.00   Total pack years: 8.00   Types: Cigarettes  Smokeless Tobacco Never  Tobacco Comments   1 pack per day.   Ready to quit: Not Answered Counseling given: Not Answered Tobacco comments: 1 pack per day.   Outpatient Encounter Medications as of 06/22/2022  Medication Sig   acetaminophen  (TYLENOL) 500 MG tablet Take 2 tablets (Total= 1,000 mg), by mouth, 2 times a day as needed. (Patient taking differently: Take 500 mg by mouth in the morning and at bedtime.)   amLODipine (NORVASC) 10 MG tablet Take 1 tablet (10 mg total) by mouth daily.   aspirin 325 MG tablet Take 1 tablet (325 mg total) by mouth daily.   atorvastatin (LIPITOR) 40 MG tablet Take 1 tablet (40 mg total) by mouth daily.   Blood Glucose Monitoring Suppl (TRUE METRIX AIR GLUCOSE METER) DEVI 1 Units by Does not apply route 2 (two) times daily.   cilostazol (PLETAL) 100 MG tablet Take 1 tablet (100 mg total) by mouth 2 (two) times daily before a meal.   ferrous sulfate 325 (65 FE) MG tablet Take 1 tablet (325 mg total) by mouth daily with breakfast.   glucose blood test strip Use as instructed   ibuprofen (ADVIL) 800 MG tablet take 1 tablet (800 mg) by mouth every 8 hours as needed for pain with food   Insulin Pen Needle (NOVOFINE) 30G X 8 MM MISC Use to inject into the skin as needed as directed   lisinopril (ZESTRIL) 20 MG tablet Take 1 tablet (20 mg total) by mouth daily.   metFORMIN (GLUCOPHAGE-XR) 500 MG 24 hr tablet Take 1 tablet (500 mg total) by mouth 2 (two) times daily. (Patient taking differently: Take 500 mg by mouth every other day.)  oxyCODONE-acetaminophen (PERCOCET) 5-325 MG tablet Take 1 tablet by mouth every 4 (four) hours as needed for severe pain.   predniSONE (DELTASONE) 10 MG tablet Take 4 tablets (40 mg total) by mouth daily. (Patient taking differently: Take 10 mg by mouth in the morning, at noon, in the evening, and at bedtime.)   PRESCRIPTION MEDICATION Place 1 drop into both eyes in the morning, at noon, in the evening, and at bedtime. Unknown prescription eye drop patient to bring to clarify   TRUEplus Lancets 33G MISC 1 Units by Does not apply route in the morning and at bedtime.   [DISCONTINUED] glucose blood (TRUE METRIX BLOOD GLUCOSE TEST) test strip Use as instructed   [DISCONTINUED]  insulin glargine (LANTUS SOLOSTAR) 100 UNIT/ML Solostar Pen Inject 30 Units into the skin at bedtime. (Patient taking differently: Inject 30 Units into the skin in the morning.)   atropine 1 % ophthalmic solution Place 1 drop into both eyes daily. (Patient not taking: Reported on 06/22/2022)   insulin glargine (LANTUS SOLOSTAR) 100 UNIT/ML Solostar Pen Inject 30 Units into the skin at bedtime.   [DISCONTINUED] Blood Glucose Monitoring Suppl (ACCU-CHEK GUIDE) w/Device KIT Use in the morning, at noon, in the evening, and at bedtime. (Patient not taking: Reported on 06/22/2022)   [DISCONTINUED] Lancets (FREESTYLE) lancets Use as instructed (Patient not taking: Reported on 06/22/2022)   No facility-administered encounter medications on file as of 06/22/2022.     Review of Systems  Review of Systems  Constitutional: Negative.   HENT: Negative.    Cardiovascular: Negative.   Gastrointestinal: Negative.   Allergic/Immunologic: Negative.   Neurological: Negative.   Psychiatric/Behavioral: Negative.         Physical Exam  BP 126/81   Pulse 80   Temp (!) 97 F (36.1 C)   Wt 174 lb (78.9 kg)   SpO2 100%   BMI 24.61 kg/m   Wt Readings from Last 5 Encounters:  06/22/22 174 lb (78.9 kg)  06/11/22 185 lb (83.9 kg)  05/27/22 185 lb (83.9 kg)  03/24/22 183 lb (83 kg)  01/30/22 188 lb (85.3 kg)     Physical Exam Vitals and nursing note reviewed.  Constitutional:      General: He is not in acute distress.    Appearance: He is well-developed.  Cardiovascular:     Rate and Rhythm: Normal rate and regular rhythm.  Pulmonary:     Effort: Pulmonary effort is normal.     Breath sounds: Normal breath sounds.  Skin:    General: Skin is warm and dry.  Neurological:     Mental Status: He is alert and oriented to person, place, and time.      Lab Results:  CBC    Component Value Date/Time   WBC 11.3 (H) 06/11/2022 1127   RBC 6.20 (H) 06/11/2022 1127   HGB 12.1 (L) 06/11/2022 1127    HGB 13.3 10/30/2021 1406   HCT 38.1 (L) 06/11/2022 1127   HCT 43.7 10/30/2021 1406   PLT 206 06/11/2022 1127   PLT 148 (L) 10/30/2021 1406   MCV 61.5 (L) 06/11/2022 1127   MCV 64 (L) 10/30/2021 1406   MCH 19.5 (L) 06/11/2022 1127   MCHC 31.8 06/11/2022 1127   RDW 18.3 (H) 06/11/2022 1127   RDW 18.7 (H) 10/30/2021 1406   LYMPHSABS 3.7 (H) 06/16/2021 1435   MONOABS 0.7 09/18/2018 1430   EOSABS 0.3 06/16/2021 1435   BASOSABS 0.1 06/16/2021 1435    BMET    Component Value  Date/Time   NA 139 06/11/2022 1127   NA 137 10/30/2021 1406   K 3.9 06/11/2022 1127   CL 98 06/11/2022 1127   CO2 28 06/11/2022 1127   GLUCOSE 132 (H) 06/11/2022 1127   BUN 7 06/11/2022 1127   BUN 19 10/30/2021 1406   CREATININE 0.89 06/11/2022 1127   CREATININE 1.02 03/12/2017 1226   CALCIUM 9.1 06/11/2022 1127   GFRNONAA >60 06/11/2022 1127   GFRNONAA 91 03/12/2017 1226   GFRAA >60 01/21/2020 1650   GFRAA 105 03/12/2017 1226      Assessment & Plan:   Type 2 diabetes mellitus with hyperglycemia, without long-term current use of insulin (HCC) - Microalbumin / creatinine urine ratio - Blood Glucose Monitoring Suppl (TRUE METRIX AIR GLUCOSE METER) DEVI; 1 Units by Does not apply route 2 (two) times daily.  Dispense: 1 each; Refill: 0 - glucose blood test strip; Use as instructed  Dispense: 100 each; Refill: 12 - TRUEplus Lancets 33G MISC; 1 Units by Does not apply route in the morning and at bedtime.  Dispense: 100 each; Refill: 2 - insulin glargine (LANTUS SOLOSTAR) 100 UNIT/ML Solostar Pen; Inject 30 Units into the skin at bedtime.  Dispense: 15 mL; Refill: 0 - CBC - Comprehensive metabolic panel - Lipid Panel - Hemoglobin A1c - AMB Referral to Pharmacy Medication Management - Ambulatory referral to Home Health  2. Cerebrovascular accident (CVA) due to thrombosis of right middle cerebral artery ([place])  - Ambulatory referral to Home Health  3. Essential hypertension  - Ambulatory referral to  Home Health  4. Anxiety and depression  - Ambulatory referral to Home Health  5. Neuropathy  - Ambulatory referral to Home Health  Follow up:  Follow up in 3 months or sooner if needed     [name], NP 06/22/2022

## 2022-06-22 NOTE — Patient Instructions (Signed)
1. Type 2 diabetes mellitus with hyperglycemia, without long-term current use of insulin (HCC)  - Microalbumin / creatinine urine ratio - Blood Glucose Monitoring Suppl (TRUE METRIX AIR GLUCOSE METER) DEVI; 1 Units by Does not apply route 2 (two) times daily.  Dispense: 1 each; Refill: 0 - glucose blood test strip; Use as instructed  Dispense: 100 each; Refill: 12 - TRUEplus Lancets 33G MISC; 1 Units by Does not apply route in the morning and at bedtime.  Dispense: 100 each; Refill: 2 - insulin glargine (LANTUS SOLOSTAR) 100 UNIT/ML Solostar Pen; Inject 30 Units into the skin at bedtime.  Dispense: 15 mL; Refill: 0 - CBC - Comprehensive metabolic panel - Lipid Panel - Hemoglobin A1c - AMB Referral to Pharmacy Medication Management - Ambulatory referral to Home Health  2. Cerebrovascular accident (CVA) due to thrombosis of right middle cerebral artery ([place])  - Ambulatory referral to Home Health  3. Essential hypertension  - Ambulatory referral to Home Health  4. Anxiety and depression  - Ambulatory referral to Home Health  5. Neuropathy  - Ambulatory referral to Home Health  Follow up:  Follow up in 3 months or sooner if needed

## 2022-06-23 ENCOUNTER — Encounter: Payer: Self-pay | Admitting: Vascular Surgery

## 2022-06-23 ENCOUNTER — Telehealth: Payer: Self-pay

## 2022-06-23 ENCOUNTER — Ambulatory Visit (INDEPENDENT_AMBULATORY_CARE_PROVIDER_SITE_OTHER): Payer: Medicaid Other | Admitting: Vascular Surgery

## 2022-06-23 ENCOUNTER — Ambulatory Visit (HOSPITAL_COMMUNITY)
Admission: RE | Admit: 2022-06-23 | Discharge: 2022-06-23 | Disposition: A | Discharge status: Home / Self Care | Payer: Medicaid Other | Source: Ambulatory Visit | Attending: Vascular Surgery | Admitting: Vascular Surgery

## 2022-06-23 VITALS — BP 127/80 | HR 68 | Temp 97.8°F | Resp 18 | Ht 70.5 in | Wt 174.0 lb

## 2022-06-23 DIAGNOSIS — I70213 Atherosclerosis of native arteries of extremities with intermittent claudication, bilateral legs: Secondary | ICD-10-CM | POA: Diagnosis not present

## 2022-06-23 DIAGNOSIS — I739 Peripheral vascular disease, unspecified: Secondary | ICD-10-CM | POA: Insufficient documentation

## 2022-06-23 LAB — LIPID PANEL
Chol/HDL Ratio: 2.9 ratio (ref 0.0–5.0)
Cholesterol, Total: 88 mg/dL — ABNORMAL LOW (ref 100–199)
HDL: 30 mg/dL — ABNORMAL LOW (ref >39)
LDL Chol Calc (NIH): 43 mg/dL (ref 0–99)
Triglycerides: 67 mg/dL (ref 0–149)
VLDL Cholesterol Cal: 15 mg/dL (ref 5–40)

## 2022-06-23 LAB — CBC
Hematocrit: 40.5 % (ref 37.5–51.0)
Hemoglobin: 11.9 g/dL — ABNORMAL LOW (ref 13.0–17.7)
MCH: 18.4 pg — ABNORMAL LOW (ref 26.6–33.0)
MCHC: 29.4 g/dL — ABNORMAL LOW (ref 31.5–35.7)
MCV: 63 fL — ABNORMAL LOW (ref 79–97)
Platelets: 262 10*3/uL (ref 150–450)
RBC: 6.47 x10E6/uL — ABNORMAL HIGH (ref 4.14–5.80)
RDW: 18.5 % — ABNORMAL HIGH (ref 11.6–15.4)
WBC: 11.4 10*3/uL — ABNORMAL HIGH (ref 3.4–10.8)

## 2022-06-23 LAB — MICROALBUMIN / CREATININE URINE RATIO
Creatinine, Urine: 393.6 mg/dL
Microalb/Creat Ratio: 13 mg/g{creat} (ref 0–29)
Microalbumin, Urine: 50 ug/mL

## 2022-06-23 LAB — COMPREHENSIVE METABOLIC PANEL
ALT: 15 IU/L (ref 0–44)
AST: 11 IU/L (ref 0–40)
Albumin/Globulin Ratio: 1.8 (ref 1.2–2.2)
Albumin: 4.3 g/dL (ref 4.1–5.1)
Alkaline Phosphatase: 108 IU/L (ref 44–121)
BUN/Creatinine Ratio: 8 — ABNORMAL LOW (ref 9–20)
BUN: 8 mg/dL (ref 6–24)
Bilirubin Total: 0.4 mg/dL (ref 0.0–1.2)
CO2: 25 mmol/L (ref 20–29)
Calcium: 9.4 mg/dL (ref 8.7–10.2)
Chloride: 101 mmol/L (ref 96–106)
Creatinine, Ser: 0.99 mg/dL (ref 0.76–1.27)
Globulin, Total: 2.4 g/dL (ref 1.5–4.5)
Glucose: 110 mg/dL — ABNORMAL HIGH (ref 70–99)
Potassium: 4 mmol/L (ref 3.5–5.2)
Sodium: 141 mmol/L (ref 134–144)
Total Protein: 6.7 g/dL (ref 6.0–8.5)
eGFR: 95 mL/min/{1.73_m2} (ref >59)

## 2022-06-23 LAB — VAS US ABI WITH/WO TBI
Left ABI: 0.88
Right ABI: 0.52

## 2022-06-23 LAB — HEMOGLOBIN A1C
Est. average glucose Bld gHb Est-mCnc: 240 mg/dL
Hgb A1c MFr Bld: 10 % — ABNORMAL HIGH (ref 4.8–5.6)

## 2022-06-23 NOTE — Progress Notes (Signed)
   Care Guide Note  06/23/2022 Name: Frank Schneider MRN: [id number] DOB: 1975/09/27  Referred by: [name], NP Reason for referral : Care Coordination (Outreach to schedule with pharm d )   Frank Schneider is a 47 y.o. year old male who is a primary care patient of [name], NP. Frank Schneider was referred to the pharmacist for assistance related to DM.    Successful contact was made with the patient to discuss pharmacy services including being ready for the pharmacist to call at least 5 minutes before the scheduled appointment time, to have medication bottles and any blood sugar or blood pressure readings ready for review. The patient agreed to meet with the pharmacist via with the pharmacist via telephone visit on (date/time).  07/22/2022  Noreene Larsson, [place], [place] [address] Direct Dial: [phone] [email or web address]

## 2022-06-23 NOTE — Progress Notes (Signed)
Patient name: Frank Schneider MRN: [id number] DOB: 02/29/1976 Sex: male  REASON FOR CONSULT: 3 month follow-up PAD/claudication  HPI: Frank Schneider is a 47 y.o. male, with history of hypertension, diabetes, CVA and tobacco abuse that presents for 3 month follow-up of his PAD.  He was previously seen with cramping in both legs consistent with claudication worse in the right leg.  He was walking about 150 yards before he had to stop.  He did have noninvasive imaging and ABIs on 03/12/2022 were 0.56 on the right biphasic and 0.88 on the left triphasic.  We put him on Pletal and managed him medically.  Since last evaluation he had his right eye removed for a painful blind right eye.  He states he is actually walking further distances since starting the Pletal.  Can now walk about 300 yards before he has to stop.  Past Medical History:  Diagnosis Date   Blind right eye    Diabetes mellitus    Takes Metformin   History of appendicitis 11/2015   Hypertension    Left flank pain 12/2019   Priapism    Stroke ([place]) 04/2015   Tobacco use    Vision abnormalities    Vitamin D deficiency 12/2019    Past Surgical History:  Procedure Laterality Date   APPENDECTOMY     EVISCERATION Right 06/11/2022   Procedure: EVISCERATION REPAIR WITH POSSIBLE INCLUSION;  Surgeon: Delia Chimes, MD;  Location: [facility];  Service: Ophthalmology;  Laterality: Right;   EYE SURGERY     cataract removed 05/27/2016 right eye    INCISION AND DRAINAGE PERIRECTAL ABSCESS  07/28/2011   Procedure: IRRIGATION AND DEBRIDEMENT PERIRECTAL ABSCESS;  Surgeon: Adin Hector, MD;  Location: [facility];  Service: General;  Laterality: N/A;   of skin muscle and subcutaneous tissue of perimeum  8cmx12cm area    IR GENERIC HISTORICAL  01/08/2016   IR RADIOLOGIST EVAL & MGMT 01/08/2016 [facility] INTERV RAD   LAPAROSCOPIC APPENDECTOMY N/A 12/16/2015   Procedure: APPENDECTOMY LAPAROSCOPIC;  Surgeon: Coralie Keens, MD;  Location: [facility];   Service: General;  Laterality: N/A;   PENECTOMY      Family History  Problem Relation Age of Onset   Diabetes Mother    Hypertension Mother    Diabetes Maternal Grandmother    Hypertension Maternal Grandmother    Depression Maternal Grandmother     SOCIAL HISTORY: Social History   Socioeconomic History   Marital status: Single    Spouse name: Not on file   Number of children: Not on file   Years of education: Not on file   Highest education level: Not on file  Occupational History   Not on file  Tobacco Use   Smoking status: Some Days    Packs/day: 1.00    Years: 8.00    Total pack years: 8.00    Types: Cigarettes   Smokeless tobacco: Never   Tobacco comments:    1 pack per day.  Vaping Use   Vaping Use: Never used  Substance and Sexual Activity   Alcohol use: Yes    Comment: occ   Drug use: Yes    Types: Marijuana    Comment: occasional   Sexual activity: Yes  Other Topics Concern   Not on file  Social History Narrative   ** Merged History Encounter **       Social Determinants of Health   Financial Resource Strain: Not on file  Food Insecurity: Food Insecurity Present (05/28/2022)  Hunger Vital Sign    Worried About Running Out of Food in the Last Year: Sometimes true    Ran Out of Food in the Last Year: Not on file  Transportation Needs: Unmet Transportation Needs (06/18/2022)   PRAPARE - Hydrologist (Medical): Yes    Lack of Transportation (Non-Medical): Yes  Physical Activity: Not on file  Stress: Not on file  Social Connections: Not on file  Intimate Partner Violence: Not on file    No Known Allergies  Current Outpatient Medications  Medication Sig Dispense Refill   acetaminophen (TYLENOL) 500 MG tablet Take 2 tablets (Total= 1,000 mg), by mouth, 2 times a day as needed. (Patient taking differently: Take 500 mg by mouth in the morning and at bedtime.) 120 tablet 3   amLODipine (NORVASC) 10 MG tablet Take 1 tablet  (10 mg total) by mouth daily. 90 tablet 3   aspirin 325 MG tablet Take 1 tablet (325 mg total) by mouth daily. 30 tablet 0   atorvastatin (LIPITOR) 40 MG tablet Take 1 tablet (40 mg total) by mouth daily. 90 tablet 3   Blood Glucose Monitoring Suppl (TRUE METRIX AIR GLUCOSE METER) DEVI 1 Units by Does not apply route 2 (two) times daily. 1 each 0   cilostazol (PLETAL) 100 MG tablet Take 1 tablet (100 mg total) by mouth 2 (two) times daily before a meal. 60 tablet 11   ferrous sulfate 325 (65 FE) MG tablet Take 1 tablet (325 mg total) by mouth daily with breakfast. 30 tablet 3   glucose blood test strip Use as instructed 100 each 12   ibuprofen (ADVIL) 800 MG tablet take 1 tablet (800 mg) by mouth every 8 hours as needed for pain with food 30 tablet 0   insulin glargine (LANTUS SOLOSTAR) 100 UNIT/ML Solostar Pen Inject 30 Units into the skin at bedtime. 15 mL 0   Insulin Pen Needle (NOVOFINE) 30G X 8 MM MISC Use to inject into the skin as needed as directed 100 each 0   lisinopril (ZESTRIL) 20 MG tablet Take 1 tablet (20 mg total) by mouth daily. 90 tablet 3   metFORMIN (GLUCOPHAGE-XR) 500 MG 24 hr tablet Take 1 tablet (500 mg total) by mouth 2 (two) times daily. (Patient taking differently: Take 500 mg by mouth every other day.) 180 tablet 1   predniSONE (DELTASONE) 10 MG tablet Take 4 tablets (40 mg total) by mouth daily. (Patient taking differently: Take 10 mg by mouth in the morning, at noon, in the evening, and at bedtime.) 16 tablet 0   PRESCRIPTION MEDICATION Place 1 drop into both eyes in the morning, at noon, in the evening, and at bedtime. Unknown prescription eye drop patient to bring to clarify     TRUEplus Lancets 33G MISC 1 Units by Does not apply route in the morning and at bedtime. 100 each 2   atropine 1 % ophthalmic solution Place 1 drop into both eyes daily. (Patient not taking: Reported on 06/22/2022)     oxyCODONE-acetaminophen (PERCOCET) 5-325 MG tablet Take 1 tablet by mouth  every 4 (four) hours as needed for severe pain. (Patient not taking: Reported on 06/23/2022) 20 tablet 0   No current facility-administered medications for this visit.    REVIEW OF SYSTEMS:  '[X]'$  denotes positive finding, '[ ]'$  denotes negative finding Cardiac  Comments:  Chest pain or chest pressure:    Shortness of breath upon exertion:    Short of breath when lying  flat:    Irregular heart rhythm:        Vascular    Pain in calf, thigh, or hip brought on by ambulation: x Bilateral legs, worse right  Pain in feet at night that wakes you up from your sleep:     Blood clot in your veins:    Leg swelling:         Pulmonary    Oxygen at home:    Productive cough:     Wheezing:         Neurologic    Sudden weakness in arms or legs:     Sudden numbness in arms or legs:     Sudden onset of difficulty speaking or slurred speech:    Temporary loss of vision in one eye:     Problems with dizziness:         Gastrointestinal    Blood in stool:     Vomited blood:         Genitourinary    Burning when urinating:     Blood in urine:        Psychiatric    Major depression:         Hematologic    Bleeding problems:    Problems with blood clotting too easily:        Skin    Rashes or ulcers:        Constitutional    Fever or chills:      PHYSICAL EXAM: Vitals:   06/23/22 1537  BP: 127/80  Pulse: 68  Resp: 18  Temp: 97.8 F (36.6 C)  TempSrc: Temporal  SpO2: 98%  Weight: 174 lb (78.9 kg)  Height: 5' 10.5" (1.791 m)    GENERAL: The patient is a well-nourished male, in no acute distress. The vital signs are documented above. CARDIAC: There is a regular rate and rhythm.  VASCULAR:  Palpable femoral pulses bilaterally Left DP palpable No palpable right pedal pulses No lower extremity tissue loss PULMONARY: No respiratory distress. ABDOMEN: Soft and non-tender. MUSCULOSKELETAL: There are no major deformities or cyanosis. NEUROLOGIC: No focal weakness or paresthesias  are detected. SKIN: There are no ulcers or rashes noted. PSYCHIATRIC: The patient has a normal affect.  DATA:    ABIs today 0.52 right monophasic and 0.88 left multiphasic (previously on 03/12/2022 0.56 on the right biphasic and 0.88 on the left triphasic)  Assessment/Plan:  47 year old male presents for 6 month follow-up of his PAD.  He was previously seen with claudication worse in the right lower extremity.  At that time he could walk 150 yards and I recommended medical therapy including Pletal and walking therapies.  Fortunately he has seen improvement and is now walking up to 300 yards before he has to stop.  I again discussed continuing Pletal with walking therapies and other lifestyle modifications.  He is on aspirin statin for risk reduction.  He is cutting back on smoking.  I will see him in 6 months with ABIs.  Discussed he is young at 25 and we manage claudication conservatively until it becomes disabling.  Marty Heck, MD [facility] Office: [phone]

## 2022-06-25 ENCOUNTER — Other Ambulatory Visit: Payer: Self-pay

## 2022-06-25 DIAGNOSIS — I70213 Atherosclerosis of native arteries of extremities with intermittent claudication, bilateral legs: Secondary | ICD-10-CM

## 2022-06-25 DIAGNOSIS — I739 Peripheral vascular disease, unspecified: Secondary | ICD-10-CM

## 2022-06-26 ENCOUNTER — Telehealth: Payer: Self-pay | Admitting: Clinical

## 2022-06-26 NOTE — Telephone Encounter (Signed)
Integrated Behavioral Health Progress Note  06/26/2022 Name: Frank Schneider MRN: [id number] DOB: 07/14/1975 Frank Schneider is a 47 y.o. year old male who sees [name], NP for primary care.  Interpreter: No.   Interpreter Name & Language: none  Assessment: Patient experiencing transportation barriers.  Ongoing Intervention: Today patient called CSW and reported that he had missed a eye surgery  Review of patient status, including review of consultants reports, relevant laboratory and other test results, and collaboration with appropriate care team members and the patient's provider was performed as part of comprehensive patient evaluation and provision of services.    [name], [facility] [phone]

## 2022-06-30 ENCOUNTER — Telehealth: Payer: Self-pay | Admitting: Clinical

## 2022-06-30 NOTE — Telephone Encounter (Signed)
Integrated Behavioral Health Progress Note  06/30/2022 Name: Frank Schneider MRN: [id number] DOB: [birth year] Frank Schneider is a 47 y.o. year old male who sees [name], NP for primary care.  Interpreter: No.   Interpreter Name & Language: none  Assessment: Patient experiencing transportation barriers.  Ongoing Intervention: Have been coordinating with referral coordinator on patient's referral for personal care services (PCS). She had been unable to determine the barriers we have encountered in getting patient PCS, as his managed care plan reported they would not cover that service. Medicaid [place] advised that his managed care plan certainly could cover this. CSW called patient today to follow up. He advised that he did receive a call from his managed care and they have scheduled an assessment with him for 07/07/22. CSW to follow.   Review of patient status, including review of consultants reports, relevant laboratory and other test results, and collaboration with appropriate care team members and the patient's provider was performed as part of comprehensive patient evaluation and provision of services.    [name], [facility] [phone]

## 2022-07-09 ENCOUNTER — Telehealth: Payer: Self-pay | Admitting: Clinical

## 2022-07-09 NOTE — Telephone Encounter (Signed)
Integrated Behavioral Health Progress Note  07/09/2022 Name: Frank Schneider MRN: [id number] DOB: 10-05-1975 Frank Schneider is a 47 y.o. year old male who sees [name], NP for primary care.  Interpreter: No.   Interpreter Name & Language: none  Assessment: Patient experiencing transportation barriers.  Ongoing Intervention: Patient called and reported that he had rescheduled his assessment with [facility] for personal care aide services. Assessment now on 07/16/22. He also needed assistance rescheduling a foot doctor appointment. Called [facility] together with patient and left voice mail with their office requesting to reschedule. Will plan to arrange transportation via [place] to this appointment for patient.  Review of patient status, including review of consultants reports, relevant laboratory and other test results, and collaboration with appropriate care team members and the patient's provider was performed as part of comprehensive patient evaluation and provision of services.    [name], [facility] [phone]

## 2022-07-14 ENCOUNTER — Telehealth: Payer: Self-pay | Admitting: Clinical

## 2022-07-14 NOTE — Telephone Encounter (Signed)
Integrated Behavioral Health Progress Note  07/14/2022 Name: Frank Schneider MRN: [id number] DOB: [birth year] Frank Schneider is a 47 y.o. year old male who sees [name], NP for primary care.  Interpreter: No.   Interpreter Name & Language: none  Assessment: Patient experiencing transportation barriers.  Ongoing Intervention: Patient called and asked what upcoming appointments he has. Advised patient of appointments. [facility] had not yet called back to schedule patient. Called with patient today and rescheduled him for 07/22/22. Arranged transportation to this appointment with [facility]. Patient has an appointment with [facility] community care nurse for assessment for PCS this Thursday as well.   Review of patient status, including review of consultants reports, relevant laboratory and other test results, and collaboration with appropriate care team members and the patient's provider was performed as part of comprehensive patient evaluation and provision of services.    [name], [facility] [phone]

## 2022-07-17 ENCOUNTER — Telehealth: Payer: Self-pay | Admitting: Clinical

## 2022-07-17 NOTE — Telephone Encounter (Signed)
Integrated Behavioral Health Progress Note  07/17/2022 Name: Frank Schneider MRN: [id number] DOB: [birth year] Frank Schneider is a 48 y.o. year old male who sees [name], NP for primary care.  Interpreter: No.   Interpreter Name & Language: none  Assessment: Patient experiencing transportation barriers.  Ongoing Intervention: Returned missed call from patient. He reported that he had the visit with the [facility] and they approved him for 2.5 hours per day of personal care aide services.   [name], [facility] [phone]

## 2022-07-21 ENCOUNTER — Telehealth: Payer: Self-pay

## 2022-07-21 NOTE — Telephone Encounter (Signed)
..   Medicaid Managed Care   Unsuccessful Outreach Note  07/21/2022 Name: Frank Schneider MRN: [id number] DOB: January 17, 1976  Referred by: [name], NP Reason for referral : Appointment (I called the patient today to get him scheduled with the MM Team. I left my name and number on his VM.)   An unsuccessful telephone outreach was attempted today. The patient was referred to the case management team for assistance with care management and care coordination.   Follow Up Plan: The care management team will reach out to the patient again over the next 5 days.   [place]  [phone]

## 2022-07-22 ENCOUNTER — Telehealth: Payer: Self-pay

## 2022-07-22 ENCOUNTER — Other Ambulatory Visit: Payer: Self-pay

## 2022-07-22 ENCOUNTER — Other Ambulatory Visit: Payer: Medicaid Other | Admitting: Pharmacist

## 2022-07-22 DIAGNOSIS — Z794 Long term (current) use of insulin: Secondary | ICD-10-CM

## 2022-07-22 DIAGNOSIS — E785 Hyperlipidemia, unspecified: Secondary | ICD-10-CM

## 2022-07-22 DIAGNOSIS — I10 Essential (primary) hypertension: Secondary | ICD-10-CM

## 2022-07-22 MED ORDER — LISINOPRIL 20 MG PO TABS: 20.0000 mg | ORAL_TABLET | Freq: Every day | ORAL | 1 refills | Status: DC

## 2022-07-22 MED ORDER — ATORVASTATIN CALCIUM 40 MG PO TABS: 40.0000 mg | ORAL_TABLET | Freq: Every day | ORAL | 1 refills | Status: DC

## 2022-07-22 MED ORDER — EMPAGLIFLOZIN 10 MG PO TABS: 10.0000 mg | ORAL_TABLET | Freq: Every day | ORAL | 1 refills | Status: DC

## 2022-07-22 MED ORDER — METFORMIN HCL ER 500 MG PO TB24: 500.0000 mg | ORAL_TABLET | Freq: Two times a day (BID) | ORAL | 1 refills | Status: DC

## 2022-07-22 NOTE — Progress Notes (Signed)
07/22/2022 Name: Frank Schneider MRN: [id number] DOB: 1975-07-26  Chief Complaint  Patient presents with   Medication Management   Diabetes   Hyperlipidemia   Hypertension    Frank Schneider is a 47 y.o. year old male who presented for a telephone visit.   They were referred to the pharmacist by their PCP for assistance in managing diabetes, hypertension, and hyperlipidemia.   Patient is participating in a Managed Medicaid Plan:  Yes  Subjective:  Care Team: Primary Care Provider: [name], NP ; Next Scheduled Visit: 09/23/22  Medication Access/Adherence  Current Pharmacy:  [place], [place] - [address] [place] [address] Phone: [phone] Fax: [phone]   Patient reports affordability concerns with their medications: No  Patient reports access/transportation concerns to their pharmacy: No  Patient reports adherence concerns with their medications:  No    Notes that his [facility] RN is supposed to be brining him a twice daily pill box.  Before eating: metformin, aspirin, atorvastatin, lisinopril, cilostazol Bedtime: cilostazol, metformin   Diabetes:  Current medications: metformin XR 500 mg twice daily, Lantus 30 units daily  Current glucose readings: reports some readings in 120s lately, though is waiting on a refill of strips  Notes he had run out of medication, specifically metformin, prior to his last A1c  Patient denies hypoglycemic s/sx including dizziness, shakiness, sweating. Patient denies hyperglycemic symptoms including polyuria, polydipsia, polyphagia, nocturia, neuropathy, blurred vision.  Hypertension:  Current medications: lisinopril 20 mg daily (has not filled amlodipine in several months and does not have a bottle with him today  Patient does not have a validated, automated, upper arm home BP cuff Current blood pressure readings readings:   Hyperlipidemia/ASCVD Risk  Reduction  Current lipid lowering medications: atorvastatin 40 mg daily  Antiplatelet regimen: aspirin 325 mg daily  Additionally on cilostazol for PAD.  Objective:  Lab Results  Component Value Date   HGBA1C 10.0 (H) 06/22/2022    Lab Results  Component Value Date   CREATININE 0.99 06/22/2022   BUN 8 06/22/2022   NA 141 06/22/2022   K 4.0 06/22/2022   CL 101 06/22/2022   CO2 25 06/22/2022    Lab Results  Component Value Date   CHOL 88 (L) 06/22/2022   HDL 30 (L) 06/22/2022   LDLCALC 43 06/22/2022   TRIG 67 06/22/2022   CHOLHDL 2.9 06/22/2022    Medications Reviewed Today     Reviewed by Osker Mason, RPH-CPP (Pharmacist) on 07/22/22 at 1120  Med List Status: <None>   Medication Order Taking? Sig Documenting Provider Last Dose Status Informant  acetaminophen (TYLENOL) 500 MG tablet [id number]  Take 2 tablets (Total= 1,000 mg), by mouth, 2 times a day as needed.  Patient taking differently: Take 500 mg by mouth in the morning and at bedtime.   Tresa Garter, MD  Active Self  amLODipine (NORVASC) 10 MG tablet [id number] No Take 1 tablet (10 mg total) by mouth daily.  Patient not taking: Reported on 07/22/2022   [name], NP Not Taking Active Self  aspirin 325 MG tablet [id number] Yes Take 1 tablet (325 mg total) by mouth daily. [name], NP Taking Active Self  atorvastatin (LIPITOR) 40 MG tablet [id number] Yes Take 1 tablet (40 mg total) by mouth daily. [name], NP Taking Active Self   Patient not taking:   Discontinued 06/22/22 1418 Blood Glucose Monitoring Suppl (TRUE METRIX AIR GLUCOSE METER) DEVI [id number]  1 Units by Does not apply route 2 (two) times daily. [name], NP  Active   cilostazol (PLETAL) 100 MG tablet [id number] Yes Take 1 tablet (100 mg total) by mouth 2 (two) times daily before a meal. Marty Heck, MD Taking Active Self  ferrous sulfate 325 (65 FE) MG tablet [id number] No Take 1 tablet (325 mg total)  by mouth daily with breakfast.  Patient not taking: Reported on 07/22/2022   [name], NP Not Taking Active Self    Discontinued 06/22/22 1418 glucose blood test strip [id number]  Use as instructed [name], NP  Active   ibuprofen (ADVIL) 800 MG tablet [id number] Yes take 1 tablet (800 mg) by mouth every 8 hours as needed for pain with food [name], NP Taking Active   insulin glargine (LANTUS SOLOSTAR) 100 UNIT/ML Solostar Pen [id number] Yes Inject 30 Units into the skin at bedtime. [name], NP Taking Active   Insulin Pen Needle (NOVOFINE) 30G X 8 MM MISC [id number] Yes Use to inject into the skin as needed as directed [name], NP Taking Active Self   Patient not taking:   Discontinued 06/22/22 1418 lisinopril (ZESTRIL) 20 MG tablet [id number] Yes Take 1 tablet (20 mg total) by mouth daily. [name], NP Taking Active Self  metFORMIN (GLUCOPHAGE-XR) 500 MG 24 hr tablet [id number] Yes Take 1 tablet (500 mg total) by mouth 2 (two) times daily. [name], NP Taking Active Self    Discontinued 07/22/22 1120 (Completed Course) TRUEplus Lancets 33G MISC [id number]  1 Units by Does not apply route in the morning and at bedtime. [name], NP  Active               Assessment/Plan:   Diabetes: - Currently uncontrolled but improving - Reviewed long term cardiovascular and renal outcomes of uncontrolled blood sugar - Reviewed goal A1c, goal fasting, and goal 2 hour post prandial glucose - Discussed CV benefits of SGLT2. Patient amenable. Discussed mechanism of action and side effects. Recommend to start Jardiance 10 mg daily, proactively reduce Lantus to 24 units daily to reduce risk of hypoglycemia. Continue metformin twice daily - Recommend to check blood sugar twice daily, fasting and 2 hour post prandial  Hypertension: - Currently controlled - Recommend to continue current regimen. 90 day supplies requested     Hyperlipidemia/ASCVD  Risk Reduction: - Currently controlled.  - Recommend to continue current regimen. 90 day supplies requested    Follow Up Plan: phone call in 6 weeks  Catie Hedwig Morton, PharmD, [place], [facility] [phone]

## 2022-07-22 NOTE — Patient Instructions (Addendum)
Jani,   It was great talking to you today!  Start Jardiance 10 mg daily. Take this in the morning. Continue metformin 500 mg twice daily. Reduce Lantus to 24 units daily.   Check your blood sugars twice daily:  1) Fasting, first thing in the morning before breakfast and  2) 2 hours after your largest meal.   For a goal A1c of less than 7%, goal fasting readings are less than 130 and goal 2 hour after meal readings are less than 180.    Fill your pill box as below:  Day:  - metformin - Jardiance  - aspirin - atorvastatin - lisinopril - cilostazol  Night:  - cilostazol - metformin  Take care!  Catie Hedwig Morton, PharmD, [place], [facility] [phone]

## 2022-07-22 NOTE — Telephone Encounter (Signed)
Jardiance prior authorization has been approved until 07/22/2023. [facility] pharmacy was contacted and a claim was successfully processed. A MyChart message was sent to patient.

## 2022-07-23 ENCOUNTER — Other Ambulatory Visit: Payer: Medicaid Other | Admitting: *Deleted

## 2022-07-23 ENCOUNTER — Encounter: Payer: Self-pay | Admitting: *Deleted

## 2022-07-23 ENCOUNTER — Telehealth: Payer: Self-pay | Admitting: Clinical

## 2022-07-23 ENCOUNTER — Telehealth: Payer: Self-pay | Admitting: Nurse Practitioner

## 2022-07-23 DIAGNOSIS — E119 Type 2 diabetes mellitus without complications: Secondary | ICD-10-CM | POA: Diagnosis not present

## 2022-07-23 NOTE — Patient Outreach (Addendum)
Medicaid Managed Care   Nurse Care Manager Note  07/23/2022 Name:  Frank Schneider MRN:  [id number] DOB:  1975-07-05  Frank Schneider is an 47 y.o. year old male who is a primary patient of Frank Foy, NP.  The [facility] Managed Care Coordination team was consulted for assistance with:    DMII  [name] was given information about Medicaid Managed Care Coordination team services today. Frank Schneider Patient agreed to services and verbal consent obtained.  Engaged with patient by telephone for initial visit in response to provider referral for case management and/or care coordination services.   Assessments/Interventions:  Review of past medical history, allergies, medications, health status, including review of consultants reports, laboratory and other test data, was performed as part of comprehensive evaluation and provision of chronic care management services.  SDOH (Social Determinants of Health) assessments and interventions performed: SDOH Interventions    Flowsheet Row Patient Outreach Telephone from 07/23/2022 in [place] Telephone from 06/18/2022 in [place] from 10/16/2021 in [place] Telephone from 09/03/2021 in [place] Office Visit from 06/16/2021 in [place] Interventions Other (Comment)  [BSW referral for food insecurity] -- -- -- --  Housing Interventions Intervention Not Indicated -- -- -- --  Transportation Interventions Payor Benefit Taxi Voucher Given Chief Strategy Officer Given Payor Benefit --  Utilities Interventions Intervention Not Indicated -- -- -- --  Depression Interventions/Treatment  -- -- -- -- Counseling       Care Plan  No Known Allergies  Medications Reviewed Today     Reviewed by Frank Montane, RN (Registered Nurse) on 07/23/22 at [place] List Status: <None>    Medication Order Taking? Sig Documenting Provider Last Dose Status Informant  acetaminophen (TYLENOL) 500 MG tablet [id number] Yes Take 2 tablets (Total= 1,000 mg), by mouth, 2 times a day as needed. Frank Garter, MD Taking Active Self  aspirin 325 MG tablet [id number] Yes Take 1 tablet (325 mg total) by mouth daily. Frank Foy, NP Taking Active Self  atorvastatin (LIPITOR) 40 MG tablet [id number] Yes Take 1 tablet (40 mg total) by mouth daily. Frank Foy, NP Taking Active    Patient not taking:   Discontinued 06/22/22 1418 Blood Glucose Monitoring Suppl (TRUE METRIX AIR GLUCOSE METER) DEVI [id number] Yes 1 Units by Does not apply route 2 (two) times daily. Frank Foy, NP Taking Active   cilostazol (PLETAL) 100 MG tablet [id number] Yes Take 1 tablet (100 mg total) by mouth 2 (two) times daily before a meal. Frank Heck, MD Taking Active Self           Med Note (Frank Schneider A   Thu Jul 23, 2022 10:56 AM)    empagliflozin (JARDIANCE) 10 MG TABS tablet [id number] No Take 1 tablet (10 mg total) by mouth daily before breakfast.  Patient not taking: Reported on 07/23/2022   Frank Foy, NP Not Taking Active   ferrous sulfate 325 (65 FE) MG tablet [id number] No Take 1 tablet (325 mg total) by mouth daily with breakfast.  Patient not taking: Reported on 07/22/2022   Frank Foy, NP Not Taking Active Self    Discontinued 06/22/22 1418 glucose blood test strip [id number] Yes Use as instructed Frank Foy, NP Taking Active   ibuprofen (ADVIL) 800 MG tablet [id number] Yes take 1  tablet (800 mg) by mouth every 8 hours as needed for pain with food Frank Foy, NP Taking Active   insulin glargine (LANTUS SOLOSTAR) 100 UNIT/ML Solostar Pen [id number] Yes Inject 30 Units into the skin at bedtime. Frank Foy, NP Taking Active   Insulin Pen Needle (NOVOFINE) 30G X 8 MM MISC [id number] Yes Use to inject into the skin as needed as directed Frank Foy, NP Taking  Active Self   Patient not taking:   Discontinued 06/22/22 1418 lisinopril (ZESTRIL) 20 MG tablet [id number] Yes Take 1 tablet (20 mg total) by mouth daily. Frank Foy, NP Taking Active   metFORMIN (GLUCOPHAGE-XR) 500 MG 24 hr tablet [id number] Yes Take 1 tablet (500 mg total) by mouth 2 (two) times daily. Frank Foy, NP Taking Active   TRUEplus Lancets 33G MISC [id number] Yes 1 Units by Does not apply route in the morning and at bedtime. 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   Acute appendicitis 12/16/2015   Right cataract 10/04/2015   Tobacco dependence 10/04/2015   Absolute anemia 08/07/2015   Neuropathy 08/07/2015   Cerebrovascular accident (CVA) ([place])    Leukocytosis    Stroke (cerebrum) ([place]) 05/09/2015   Type 2 diabetes mellitus with circulatory disorder ([place]) 03/12/2015   Hyperlipidemia 02/14/2015   History of CVA (cerebrovascular accident) 02/14/2015   CVA (cerebral vascular accident) ([place]) 01/01/2015   Diabetes mellitus type 2 with complications, uncontrolled 01/01/2015   Stroke ([place])    Essential hypertension    Smoker    Tobacco use     Conditions to be addressed/monitored per PCP order:  DMII  Care Plan : RN Care Manager Plan of Care  Updates made by Frank Montane, RN since 07/23/2022 12:00 AM     Problem: Health Management needs related to DM      Long-Range Goal: Development of Plan of Care to address Health Management needs related to DM   Start Date: 07/23/2022  Expected End Date: 10/21/2022   Note:   Current Barriers:  Chronic Disease Management support and education needs related to DMII  RNCM Clinical Goal(s):  Patient will verbalize understanding of plan for management of DMII as evidenced by patient reports take all medications exactly as prescribed and will call provider for medication related questions as evidenced by patient reports and documentation in EMR    attend all scheduled medical appointments: 08/18/22 with Pharmacist and 09/23/22 with PCP as evidenced by provider documentation        through collaboration with RN Care manager, provider, and care team.   Interventions: Inter-disciplinary care team collaboration (see longitudinal plan of care) Evaluation of current treatment plan related to  self management and patient's adherence to plan as established by provider   Diabetes:  (Status: New goal.) Long Term Goal   Lab Results  Component Value Date   HGBA1C 10.0 (H) 06/22/2022   @ Assessed patient's understanding of A1c goal: <7% Provided education to patient about basic DM disease process; Reviewed medications  with patient and discussed importance of medication adherence;        Reviewed prescribed diet with patient diabetic diet; Discussed plans with patient for ongoing care management follow up and provided patient with direct contact information for care management team;      Reviewed scheduled/upcoming provider appointments including: PCP on 09/23/22;         Review of patient status, including review of consultants reports, relevant laboratory and other test results, and medications completed;       Assessed social determinant of health barriers;        Advised patient to contact [facility] pharmacy for needed refills, ask for medications to be delivered and request a charge account Provided patient with [facility] medical transportation [phone]  Patient Goals/Self-Care Activities: Take medications as prescribed   Attend all scheduled provider  appointments Call pharmacy for medication refills 3-7 days in advance of running out of medications Call provider office for new concerns or questions  schedule appointment with eye doctor fill half of plate with vegetables manage portion size Exercise 30 minutes a day       Follow Up:  Patient agrees to Care Plan and Follow-up.  Plan: The Managed Medicaid care management team will reach out to the patient again over the next 30 days.  Date/time of next scheduled RN care management/care coordination outreach:  08/25/22 @ 1:15pm  Lurena Joiner RN, [facility] RN Care Coordinator [phone]

## 2022-07-23 NOTE — Patient Instructions (Signed)
Visit Information  Frank Schneider was given information about Medicaid Managed Care team care coordination services as a part of their [place] Medicaid benefit. Frank Schneider verbally consented to engagement with the [facility] Managed Care team.   If you are experiencing a medical emergency, please call 911 or report to your local emergency department or urgent care.   If you have a non-emergency medical problem during routine business hours, please contact your provider's office and ask to speak with a nurse.   For questions related to your [facility], please call: [phone] or visit the homepage here: [email or web address]  If you would like to schedule transportation through your [facility], please call the following number at least 2 days in advance of your appointment: [phone]   Rides for urgent appointments can also be made after hours by calling Member Services.  Call the [place] at [phone], at any time, 24 hours a day, 7 days a week. If you are in danger or need immediate medical attention call 911.  If you would like help to quit smoking, call 1-800-QUIT-NOW 204-864-7849) OR Espaol: 1-855-Djelo-Ya QO:409462) o para ms informacin haga clic aqu or Text READY to 200-400 to register via text  Frank Schneider,   Please see education materials related to DM provided by MyChart link.  Patient verbalizes understanding of instructions and care plan provided today and agrees to view in [place]. Active MyChart status and patient understanding of how to access instructions and care plan via MyChart confirmed with patient.     Telephone follow up appointment with Managed Medicaid care management team member scheduled for:08/25/22 @ 1:15pm  Frank Joiner RN, BSN [facility] RN Care  Coordinator [phone]   Following is a copy of your plan of care:  Care Plan : RN Care Manager Plan of Care  Updates made by Frank Montane, RN since 07/23/2022 12:00 AM     Problem: Health Management needs related to DM      Long-Range Goal: Development of Plan of Care to address Health Management needs related to DM   Start Date: 07/23/2022  Expected End Date: 10/21/2022  Note:   Current Barriers:  Chronic Disease Management support and education needs related to DMII  RNCM Clinical Goal(s):  Patient will verbalize understanding of plan for management of DMII as evidenced by patient reports take all medications exactly as prescribed and will call provider for medication related questions as evidenced by patient reports and documentation in EMR    attend all scheduled medical appointments: 08/18/22 with Pharmacist and 09/23/22 with PCP as evidenced by provider documentation        through collaboration with RN Care manager, provider, and care team.   Interventions: Inter-disciplinary care team collaboration (see longitudinal plan of care) Evaluation of current treatment plan related to  self management and patient's adherence to plan as established by provider   Diabetes:  (Status: New goal.) Long Term Goal   Lab Results  Component Value Date   HGBA1C 10.0 (H) 06/22/2022   @ Assessed patient's understanding of A1c goal: <7% Provided education to patient about basic DM disease process; Reviewed medications with patient and discussed importance of medication adherence;        Reviewed prescribed diet with patient diabetic diet; Discussed plans with patient for ongoing care management follow up and provided patient with direct contact information for care management team;      Reviewed  scheduled/upcoming provider appointments including: PCP on 09/23/22;         Review of patient status, including review of consultants reports, relevant laboratory and other test results, and  medications completed;       Assessed social determinant of health barriers;        Advised patient to contact [facility] for needed refills, ask for medications to be delivered and request a charge account Provided patient with [facility] medical transportation [phone]  Patient Goals/Self-Care Activities: Take medications as prescribed   Attend all scheduled provider appointments Call pharmacy for medication refills 3-7 days in advance of running out of medications Call provider office for new concerns or questions  schedule appointment with eye doctor fill half of plate with vegetables manage portion size Exercise 30 minutes a day

## 2022-07-23 NOTE — Telephone Encounter (Addendum)
Integrated Behavioral Health Progress Note  07/23/2022 Name: Frank Schneider MRN: [id number] DOB: [birth year] Frank Schneider is a 47 y.o. year old male who sees Frank Foy, NP for primary care. LCSW was initially consulted to assist patient with community resources.  Interpreter: No.   Interpreter Name & Language: none  Assessment: Patient is experiencing financial difficulty related to to low income. He is also in need of additional community resources.  Ongoing Intervention: Patient called and requested assistance with some issues at his apartment. His home is in need of several repairs. He lives in project based housing and has requested repairs and assistance from the management/housing authority, but these repairs continue to not be addressed. The housing authority has stated that they plan to tear down his building, and he will need to move, but this has been planned for quite some time now and patient needs the repairs if he continues to live there. Patient requests assistance in either moving or having the repairs done. Will plan to refer to the [employer] [facility]) for assistance with tenant advocacy on this matter.  Patient also needs podiatry appointment rescheduled, as the provider's office needed to move his appointment. Will assist in rescheduling and arranging transport to new appointment.  Frank Schneider, [facility] [phone]

## 2022-07-23 NOTE — Telephone Encounter (Signed)
Faxed [facility]

## 2022-07-23 NOTE — Telephone Encounter (Signed)
Form from home care delivered dated 07/22/2022 for glucose monitor and supplies has been signed and given to Candice Camp, [place]. Please fax.

## 2022-07-27 ENCOUNTER — Telehealth: Payer: Self-pay | Admitting: Clinical

## 2022-07-27 DIAGNOSIS — I63311 Cerebral infarction due to thrombosis of right middle cerebral artery: Secondary | ICD-10-CM | POA: Diagnosis not present

## 2022-07-27 NOTE — Telephone Encounter (Signed)
Integrated Behavioral Health Progress Note  07/27/2022 Name: Dorvin Krebs MRN: [id number] DOB: 1975-12-31 Arye Espejo is a 47 y.o. year old male who sees [name], NP for primary care. LCSW was initially consulted to assist patient with community resources.  Interpreter: No.   Interpreter Name & Language: none  Assessment: Patient is experiencing financial difficulty related to to low income. He is also in need of additional community resources.  Ongoing Intervention: Called patient's podiatrist together with patient and rescheduled appointment for 3/10. Arranged transportation via [place] to this appointment. [facility]) and left message regarding patient's repair needs at his apartment; awaiting return call to complete referral.  [name], [facility] [phone]

## 2022-07-28 DIAGNOSIS — I63311 Cerebral infarction due to thrombosis of right middle cerebral artery: Secondary | ICD-10-CM | POA: Diagnosis not present

## 2022-07-29 DIAGNOSIS — I63311 Cerebral infarction due to thrombosis of right middle cerebral artery: Secondary | ICD-10-CM | POA: Diagnosis not present

## 2022-07-30 DIAGNOSIS — I63311 Cerebral infarction due to thrombosis of right middle cerebral artery: Secondary | ICD-10-CM | POA: Diagnosis not present

## 2022-07-31 DIAGNOSIS — I63311 Cerebral infarction due to thrombosis of right middle cerebral artery: Secondary | ICD-10-CM | POA: Diagnosis not present

## 2022-08-01 DIAGNOSIS — I63311 Cerebral infarction due to thrombosis of right middle cerebral artery: Secondary | ICD-10-CM | POA: Diagnosis not present

## 2022-08-02 DIAGNOSIS — I63311 Cerebral infarction due to thrombosis of right middle cerebral artery: Secondary | ICD-10-CM | POA: Diagnosis not present

## 2022-08-03 DIAGNOSIS — I63311 Cerebral infarction due to thrombosis of right middle cerebral artery: Secondary | ICD-10-CM | POA: Diagnosis not present

## 2022-08-04 ENCOUNTER — Telehealth: Payer: Self-pay | Admitting: Clinical

## 2022-08-04 DIAGNOSIS — I63311 Cerebral infarction due to thrombosis of right middle cerebral artery: Secondary | ICD-10-CM | POA: Diagnosis not present

## 2022-08-04 NOTE — Telephone Encounter (Addendum)
Integrated Behavioral Health Progress Note  08/04/2022 Name: Frank Schneider MRN: [id number] DOB: 1975-11-30 [name] is a 47 y.o. year old male who sees [name], NP for primary care. LCSW was initially consulted to assist patient with community resources.  Interpreter: No.   Interpreter Name & Language: none  Assessment: Patient is experiencing financial difficulty related to to low income. He is also in need of additional community resources.  Ongoing Intervention: Patient called and asked for assistance in contacting his eye doctor for follow up. Called [facility] together with patient; confirmed his follow up appointment for 09/01/22. He also had a questions for the eye doctor's nurse, assisted him in leaving voicemail on nurse line. Provided patient with their direct phone number.  Confirmed patient's podiatry appointment for tomorrow. Transportation has been arranged with [employer].  Abigail Butts, LCSW Patient Care Center [facility] [phone]

## 2022-08-05 DIAGNOSIS — L603 Nail dystrophy: Secondary | ICD-10-CM | POA: Diagnosis not present

## 2022-08-05 DIAGNOSIS — E1142 Type 2 diabetes mellitus with diabetic polyneuropathy: Secondary | ICD-10-CM | POA: Diagnosis not present

## 2022-08-05 DIAGNOSIS — I739 Peripheral vascular disease, unspecified: Secondary | ICD-10-CM | POA: Diagnosis not present

## 2022-08-05 DIAGNOSIS — I63311 Cerebral infarction due to thrombosis of right middle cerebral artery: Secondary | ICD-10-CM | POA: Diagnosis not present

## 2022-08-05 DIAGNOSIS — L84 Corns and callosities: Secondary | ICD-10-CM | POA: Diagnosis not present

## 2022-08-05 DIAGNOSIS — M792 Neuralgia and neuritis, unspecified: Secondary | ICD-10-CM | POA: Diagnosis not present

## 2022-08-05 DIAGNOSIS — B351 Tinea unguium: Secondary | ICD-10-CM | POA: Diagnosis not present

## 2022-08-05 DIAGNOSIS — M2042 Other hammer toe(s) (acquired), left foot: Secondary | ICD-10-CM | POA: Diagnosis not present

## 2022-08-05 DIAGNOSIS — M2041 Other hammer toe(s) (acquired), right foot: Secondary | ICD-10-CM | POA: Diagnosis not present

## 2022-08-06 ENCOUNTER — Telehealth: Payer: Self-pay | Admitting: Nurse Practitioner

## 2022-08-06 DIAGNOSIS — I63311 Cerebral infarction due to thrombosis of right middle cerebral artery: Secondary | ICD-10-CM | POA: Diagnosis not present

## 2022-08-06 NOTE — Telephone Encounter (Signed)
Home care delivered form dated 08/04/22 completed for incontinence supplies and given to Ruben Im, RMA. Please fax.

## 2022-08-06 NOTE — Telephone Encounter (Signed)
Faxed and filed [facility]

## 2022-08-07 DIAGNOSIS — R3981 Functional urinary incontinence: Secondary | ICD-10-CM | POA: Diagnosis not present

## 2022-08-07 DIAGNOSIS — E119 Type 2 diabetes mellitus without complications: Secondary | ICD-10-CM | POA: Diagnosis not present

## 2022-08-07 DIAGNOSIS — I63311 Cerebral infarction due to thrombosis of right middle cerebral artery: Secondary | ICD-10-CM | POA: Diagnosis not present

## 2022-08-08 DIAGNOSIS — I63311 Cerebral infarction due to thrombosis of right middle cerebral artery: Secondary | ICD-10-CM | POA: Diagnosis not present

## 2022-08-09 DIAGNOSIS — I63311 Cerebral infarction due to thrombosis of right middle cerebral artery: Secondary | ICD-10-CM | POA: Diagnosis not present

## 2022-08-10 ENCOUNTER — Telehealth: Payer: Self-pay | Admitting: Clinical

## 2022-08-10 DIAGNOSIS — I63311 Cerebral infarction due to thrombosis of right middle cerebral artery: Secondary | ICD-10-CM | POA: Diagnosis not present

## 2022-08-10 NOTE — Telephone Encounter (Signed)
Integrated Behavioral Health Progress Note  08/10/2022 Name: Frank Schneider MRN: [id number] DOB: Aug 26, 1975 Frank Schneider is a 47 y.o. year old male who sees [name], NP for primary care. LCSW was initially consulted to assist patient with community resources.  Interpreter: No.   Interpreter Name & Language: none  Assessment: Patient is experiencing financial difficulty related to low income. He is also in need of additional community resources.  Ongoing Intervention: Patient called CSW to follow up on referral from [employer] [facility]). He reported that they came the other week and coordinated with his landlord to complete some of the needed repairs, but there were several other repairs left still. Since his apartment is managed through the [employer], the repair requests do have to go through them. CSW reached out to [facility] again and they indicated they will follow up with patient for continued assistance.  Abigail Butts, LCSW Patient Care Center [facility] [phone]

## 2022-08-11 DIAGNOSIS — I63311 Cerebral infarction due to thrombosis of right middle cerebral artery: Secondary | ICD-10-CM | POA: Diagnosis not present

## 2022-08-12 DIAGNOSIS — I63311 Cerebral infarction due to thrombosis of right middle cerebral artery: Secondary | ICD-10-CM | POA: Diagnosis not present

## 2022-08-13 DIAGNOSIS — I63311 Cerebral infarction due to thrombosis of right middle cerebral artery: Secondary | ICD-10-CM | POA: Diagnosis not present

## 2022-08-14 DIAGNOSIS — I63311 Cerebral infarction due to thrombosis of right middle cerebral artery: Secondary | ICD-10-CM | POA: Diagnosis not present

## 2022-08-15 DIAGNOSIS — I63311 Cerebral infarction due to thrombosis of right middle cerebral artery: Secondary | ICD-10-CM | POA: Diagnosis not present

## 2022-08-16 DIAGNOSIS — I63311 Cerebral infarction due to thrombosis of right middle cerebral artery: Secondary | ICD-10-CM | POA: Diagnosis not present

## 2022-08-17 ENCOUNTER — Telehealth: Payer: Self-pay | Admitting: Clinical

## 2022-08-17 DIAGNOSIS — I63311 Cerebral infarction due to thrombosis of right middle cerebral artery: Secondary | ICD-10-CM | POA: Diagnosis not present

## 2022-08-17 NOTE — Telephone Encounter (Addendum)
Integrated Behavioral Health Progress Note  08/17/2022 Name: Smayan Hackbart MRN: [id number] DOB: 09-12-1975 Heyward Douthit is a 47 y.o. year old male who sees [name], NP for primary care. LCSW was initially consulted to assist patient with community resources.  Interpreter: No.   Interpreter Name & Language: none  Assessment: Patient is experiencing financial difficulty related to low income. He is also in need of additional community resources.  Ongoing Intervention: Patient called CSW and reported that the [employer] has given him a notice that they will terminate his lease due to him owing back rental payments. Patient had been paying rent, but there seems to have been a discrepancy in what was actually owed, vs what the Housing Authority was telling him to pay previously. Sent referral to [employer] for assistance on this.   Abigail Butts, LCSW Patient Care Center [facility] [phone]

## 2022-08-18 ENCOUNTER — Other Ambulatory Visit: Payer: Medicaid Other | Admitting: Pharmacist

## 2022-08-18 DIAGNOSIS — E785 Hyperlipidemia, unspecified: Secondary | ICD-10-CM

## 2022-08-18 DIAGNOSIS — I63311 Cerebral infarction due to thrombosis of right middle cerebral artery: Secondary | ICD-10-CM | POA: Diagnosis not present

## 2022-08-18 DIAGNOSIS — I10 Essential (primary) hypertension: Secondary | ICD-10-CM

## 2022-08-18 NOTE — Patient Instructions (Signed)
Wah,   It was great talking with you today!  We have sent refills with 90 day supplies to your pharmacy. Increase Jardiance to 25 mg daily, stop metformin.   I'll see you in the office in 2 weeks to walk through using the [place].   Thanks!  Catie Eppie Gibson, PharmD, BCACP, CPP [facility] [phone]

## 2022-08-18 NOTE — Progress Notes (Signed)
08/18/2022 Name: Frank Schneider MRN: [id number] DOB: November 02, 1975  Chief Complaint  Patient presents with   Medication Management   Hypertension   Diabetes   Hyperlipidemia    Jeovany Hinojos is a 47 y.o. year old male who presented for a telephone visit.   They were referred to the pharmacist by their PCP for assistance in managing diabetes, hypertension, and hyperlipidemia.   Patient is participating in a Managed Medicaid Plan:  Yes  Subjective:  Care Team: Primary Care Provider: [name], NP ; Next Scheduled Visit: 09/23/22 VVS: Chestine Spore;   Medication Access/Adherence  Current Pharmacy:  [facility] - [place], [place] - [address] [place] [address] Phone: [phone] Fax: [phone]   Patient reports affordability concerns with their medications: No  Patient reports access/transportation concerns to their pharmacy: No  Patient reports adherence concerns with their medications:  No     Diabetes:  Current medications: metformin XR 500 mg twice daily - reports he stopped this due to loose bowels, even with 1 tablet daily, Jardiance 10 mg daily, Lantus 20 units daily;  Current glucose readings: reports yesterday had a reading over 200; reports other readings of 145;   Reports he is tolerating Jardiance well.   Patient denies hypoglycemic s/sx including dizziness, shakiness, sweating.   Hypertension:  Current medications: lisinopril 20 mg daily, reports now that he is taking amlodipine 10 mg daily every day   Hyperlipidemia/ASCVD Risk Reduction  Current lipid lowering medications: atorvastatin 40 mg daily  Additionally on cilostazol 100 mg twice daily  Objective:  Lab Results  Component Value Date   HGBA1C 10.0 (H) 06/22/2022    Lab Results  Component Value Date   CREATININE 0.99 06/22/2022   BUN 8 06/22/2022   NA 141 06/22/2022   K 4.0 06/22/2022   CL 101 06/22/2022   CO2 25 06/22/2022    Lab  Results  Component Value Date   CHOL 88 (L) 06/22/2022   HDL 30 (L) 06/22/2022   LDLCALC 43 06/22/2022   TRIG 67 06/22/2022   CHOLHDL 2.9 06/22/2022    Medications Reviewed Today     Reviewed by [name], RN (Registered Nurse) on 07/23/22 at 1056  Med List Status: <None>   Medication Order Taking? Sig Documenting Provider Last Dose Status Informant  acetaminophen (TYLENOL) 500 MG tablet [id number] Yes Take 2 tablets (Total= 1,000 mg), by mouth, 2 times a day as needed. [name], MD Taking Active Self  aspirin 325 MG tablet [id number] Yes Take 1 tablet (325 mg total) by mouth daily. [name], NP Taking Active Self  atorvastatin (LIPITOR) 40 MG tablet [id number] Yes Take 1 tablet (40 mg total) by mouth daily. [name], NP Taking Active    Patient not taking:   Discontinued 06/22/22 1418 Blood Glucose Monitoring Suppl (TRUE METRIX AIR GLUCOSE METER) DEVI [id number] Yes 1 Units by Does not apply route 2 (two) times daily. [name], NP Taking Active   cilostazol (PLETAL) 100 MG tablet [id number] Yes Take 1 tablet (100 mg total) by mouth 2 (two) times daily before a meal. [name], MD Taking Active Self           Med Note (ROBB, MELANIE A   Thu Jul 23, 2022 10:56 AM)    empagliflozin (JARDIANCE) 10 MG TABS tablet [id number] No Take 1 tablet (10 mg total) by mouth daily before breakfast.  Patient not taking: Reported on 07/23/2022  [name], NP Not Taking Active   ferrous sulfate 325 (65 FE) MG tablet [id number] No Take 1 tablet (325 mg total) by mouth daily with breakfast.  Patient not taking: Reported on 07/22/2022   [name], NP Not Taking Active Self    Discontinued 06/22/22 1418 glucose blood test strip [id number] Yes Use as instructed [name], NP Taking Active   ibuprofen (ADVIL) 800 MG tablet [id number] Yes take 1 tablet (800 mg) by mouth every 8 hours as needed for pain with food [name], NP Taking Active    insulin glargine (LANTUS SOLOSTAR) 100 UNIT/ML Solostar Pen [id number] Yes Inject 30 Units into the skin at bedtime. [name], NP Taking Active   Insulin Pen Needle (NOVOFINE) 30G X 8 MM MISC [id number] Yes Use to inject into the skin as needed as directed [name], NP Taking Active Self   Patient not taking:   Discontinued 06/22/22 1418 lisinopril (ZESTRIL) 20 MG tablet [id number] Yes Take 1 tablet (20 mg total) by mouth daily. [name], NP Taking Active   metFORMIN (GLUCOPHAGE-XR) 500 MG 24 hr tablet [id number] Yes Take 1 tablet (500 mg total) by mouth 2 (two) times daily. [name], NP Taking Active   TRUEplus Lancets 33G MISC [id number] Yes 1 Units by Does not apply route in the morning and at bedtime. [name], NP Taking Active               Assessment/Plan:   Diabetes: - Currently uncontrolled.  - Reviewed long term cardiovascular and renal outcomes of uncontrolled blood sugar - Reviewed goal A1c, goal fasting, and goal 2 hour post prandial glucose - Recommend to decrease metformin due to continued intolerance of XR formulation. Recommend to increase Jardiance to 25 mg daily, continue Lantus at this time. Will discuss with PCP.  - Discussed CGM. Patient is interested. Will place PA and place order per system wide standing order when approved.   Hypertension: - Currently controlled - Recommend to continue current regimen at this time. Will collaborate with PCP for refills as patient requests.   Hyperlipidemia/ASCVD Risk Reduction: - Currently controlled.  - Recommend to continue current regimen at this time. Will collaborate with patient for refills as requested.   Follow Up Plan: will meet patient for CGM placement education in ~ 2 weeks  Catie Eppie Gibson, PharmD, [place], CPP [facility] [phone]

## 2022-08-19 DIAGNOSIS — I63311 Cerebral infarction due to thrombosis of right middle cerebral artery: Secondary | ICD-10-CM | POA: Diagnosis not present

## 2022-08-19 MED ORDER — EMPAGLIFLOZIN 25 MG PO TABS: 25.0000 mg | ORAL_TABLET | Freq: Every day | ORAL | 2 refills | Status: DC

## 2022-08-19 MED ORDER — ATORVASTATIN CALCIUM 40 MG PO TABS: 40.0000 mg | ORAL_TABLET | Freq: Every day | ORAL | 1 refills | Status: DC

## 2022-08-19 MED ORDER — LISINOPRIL 20 MG PO TABS: 20.0000 mg | ORAL_TABLET | Freq: Every day | ORAL | 1 refills | Status: DC

## 2022-08-19 MED ORDER — AMLODIPINE BESYLATE 10 MG PO TABS: 10.0000 mg | ORAL_TABLET | Freq: Every day | ORAL | 1 refills | Status: DC

## 2022-08-20 ENCOUNTER — Telehealth: Payer: Self-pay | Admitting: Nurse Practitioner

## 2022-08-20 DIAGNOSIS — I63311 Cerebral infarction due to thrombosis of right middle cerebral artery: Secondary | ICD-10-CM | POA: Diagnosis not present

## 2022-08-20 MED ORDER — FREESTYLE LIBRE 3 SENSOR MISC: 1 refills | Status: DC

## 2022-08-20 NOTE — Progress Notes (Signed)
Care Coordination Call  PA for [place] approved. Script sent to [facility] under systemwide standing order.   Follow up with me in office in 2 weeks as scheduled.   Catie Eppie Gibson, PharmD, BCACP, CPP [facility] [phone]

## 2022-08-20 NOTE — Telephone Encounter (Signed)
A user error has taken place: encounter opened in error, closed for administrative reasons.

## 2022-08-20 NOTE — Addendum Note (Signed)
Addended by: Nilda Simmer T on: 08/20/2022 08:54 AM   Modules accepted: Orders

## 2022-08-21 DIAGNOSIS — I63311 Cerebral infarction due to thrombosis of right middle cerebral artery: Secondary | ICD-10-CM | POA: Diagnosis not present

## 2022-08-22 DIAGNOSIS — E119 Type 2 diabetes mellitus without complications: Secondary | ICD-10-CM | POA: Diagnosis not present

## 2022-08-22 DIAGNOSIS — I63311 Cerebral infarction due to thrombosis of right middle cerebral artery: Secondary | ICD-10-CM | POA: Diagnosis not present

## 2022-08-23 DIAGNOSIS — I63311 Cerebral infarction due to thrombosis of right middle cerebral artery: Secondary | ICD-10-CM | POA: Diagnosis not present

## 2022-08-24 DIAGNOSIS — I63311 Cerebral infarction due to thrombosis of right middle cerebral artery: Secondary | ICD-10-CM | POA: Diagnosis not present

## 2022-08-25 ENCOUNTER — Other Ambulatory Visit: Payer: Medicaid Other | Admitting: *Deleted

## 2022-08-25 ENCOUNTER — Encounter: Payer: Self-pay | Admitting: *Deleted

## 2022-08-25 DIAGNOSIS — I63311 Cerebral infarction due to thrombosis of right middle cerebral artery: Secondary | ICD-10-CM | POA: Diagnosis not present

## 2022-08-25 NOTE — Patient Instructions (Addendum)
Visit Information  Frank Schneider was given information about Medicaid Managed Care team care coordination services as a part of their [facility] Medicaid benefit. Frank Schneider verbally consented to engagement with the [facility] Managed Care team.   If you are experiencing a medical emergency, please call 911 or report to your local emergency department or urgent care.   If you have a non-emergency medical problem during routine business hours, please contact your provider's office and ask to speak with a nurse.   For questions related to your [facility], please call: [phone] or visit the homepage here: [email or web address]  If you would like to schedule transportation through your [facility], please call the following number at least 2 days in advance of your appointment: [phone]   Rides for urgent appointments can also be made after hours by calling Member Services.  Call the Behavioral Health Crisis Line at [phone], at any time, 24 hours a day, 7 days a week. If you are in danger or need immediate medical attention call 911.  If you would like help to quit smoking, call 1-800-QUIT-NOW (618-026-3917) OR Espaol: 1-855-Djelo-Ya (1-324-401-0272) o para ms informacin haga clic aqu or Text READY to 536-644 to register via text  [name],   Please see education materials related to DM provided by MyChart link.  Patient verbalizes understanding of instructions and care plan provided today and agrees to view in MyChart. Active MyChart status and patient understanding of how to access instructions and care plan via MyChart confirmed with patient.     Telephone follow up appointment with Managed Medicaid care management team member scheduled for:09/25/22 @ 2:30pm  Frank Emms RN, BSN [place]  Managed [facility] RN Care  Coordinator [phone]   Following is a copy of your plan of care:  Care Plan : RN Care Manager Plan of Care  Updates made by [name], RN since 08/25/2022 12:00 AM     Problem: Health Management needs related to DM      Long-Range Goal: Development of Plan of Care to address Health Management needs related to DM   Start Date: 07/23/2022  Expected End Date: 10/21/2022  Note:   Current Barriers:  Chronic Disease Management support and education needs related to DMII  RNCM Clinical Goal(s):  Patient will verbalize understanding of plan for management of DMII as evidenced by patient reports take all medications exactly as prescribed and will call provider for medication related questions as evidenced by patient reports and documentation in EMR    attend all scheduled medical appointments: 09/03/22 with Pharmacist and 09/23/22 with PCP as evidenced by provider documentation        through collaboration with RN Care manager, provider, and care team.   Interventions: Inter-disciplinary care team collaboration (see longitudinal plan of care) Evaluation of current treatment plan related to  self management and patient's adherence to plan as established by provider   Diabetes:  (Status: Goal on Track (progressing): YES.) Long Term Goal   Lab Results  Component Value Date   HGBA1C 10.0 (H) 06/22/2022   @ Assessed patient's understanding of A1c goal: <7% Provided education to patient about basic DM disease process; Reviewed medications with patient and discussed importance of medication adherence;        Reviewed prescribed diet with patient diabetic diet; Discussed plans with patient for ongoing care management follow up and provided patient with direct contact information for care management team;  Reviewed scheduled/upcoming provider appointments including: Pharmacist on 09/03/22 and PCP on 09/23/22;         Review of patient status, including review of consultants reports,  relevant laboratory and other test results, and medications completed;       Assessed social determinant of health barriers;        Provided patient with [employer] medical transportation [phone] Discussed the benefits of exercise to manage BS Discussed member benefits offered by [facility] for GYM membership, provided with www.youronepass.com to get a code for free GYM membership at participating locations Advised patient to take CGM and medications to upcoming appointments Collaborate with PCP, patient requesting refill on Ibuprofen for low back pain  Patient Goals/Self-Care Activities: Take medications as prescribed   Attend all scheduled provider appointments Call pharmacy for medication refills 3-7 days in advance of running out of medications Call provider office for new concerns or questions  schedule appointment with eye doctor fill half of plate with vegetables manage portion size Exercise 30 minutes a day

## 2022-08-25 NOTE — Patient Outreach (Addendum)
Medicaid Managed Care   Nurse Care Manager Note  08/25/2022 Name:  Frank Schneider MRN:  [id number] DOB:  [birth year]  Frank Schneider is an 47 y.o. year old male who is a primary patient of [name], NP.  [facility] Managed Care Coordination team was consulted for assistance with:    DMII  Frank Schneider was given information about Medicaid Managed Care Coordination team services today. Frank Schneider Patient agreed to services and verbal consent obtained.  Engaged with patient by telephone for follow up visit in response to provider referral for case management and/or care coordination services.   Assessments/Interventions:  Review of past medical history, allergies, medications, health status, including review of consultants reports, laboratory and other test data, was performed as part of comprehensive evaluation and provision of chronic care management services.  SDOH (Social Determinants of Health) assessments and interventions performed: SDOH Interventions    Flowsheet Row Patient Outreach Telephone from 08/25/2022 in [place] POPULATION HEALTH DEPARTMENT Patient Outreach Telephone from 07/23/2022 in [place] POPULATION HEALTH DEPARTMENT Telephone from 06/18/2022 in [place] [facility] Clinical Support from 10/16/2021 in [facility] Telephone from 09/03/2021 in [facility] Office Visit from 06/16/2021 in [facility]  SDOH Interventions        Food Insecurity Interventions -- Other (Comment)  [BSW referral for food insecurity] -- -- -- --  Housing Interventions -- Intervention Not Indicated -- -- -- --  Transportation Interventions Payor Benefit Payor Benefit Taxi Voucher Given Conservation officer, nature Given Payor Benefit --  Utilities Interventions -- Intervention Not Indicated -- -- -- --  Depression Interventions/Treatment  -- -- -- -- -- Counseling       Care Plan  No Known Allergies  Medications Reviewed  Today     Reviewed by [name], RN (Registered Nurse) on 08/25/22 at 1354  Med List Status: <None>   Medication Order Taking? Sig Documenting Provider Last Dose Status Informant  acetaminophen (TYLENOL) 500 MG tablet [id number] No Take 2 tablets (Total= 1,000 mg), by mouth, 2 times a day as needed.  Patient not taking: Reported on 08/25/2022   [name], MD Not Taking Active Self  amLODipine (NORVASC) 10 MG tablet [id number] Yes Take 1 tablet (10 mg total) by mouth daily. [name], NP Taking Active   aspirin 325 MG tablet [id number] Yes Take 1 tablet (325 mg total) by mouth daily. [name], NP Taking Active Self  atorvastatin (LIPITOR) 40 MG tablet [id number] Yes Take 1 tablet (40 mg total) by mouth daily. [name], NP Taking Active    Patient not taking:   Discontinued 06/22/22 1418 Blood Glucose Monitoring Suppl (TRUE METRIX AIR GLUCOSE METER) DEVI [id number] Yes 1 Units by Does not apply route 2 (two) times daily. [name], NP Taking Active   cilostazol (PLETAL) 100 MG tablet [id number] Yes Take 1 tablet (100 mg total) by mouth 2 (two) times daily before a meal. [name], MD Taking Active Self           Med Note (Azucena Dart A   Thu Jul 23, 2022 10:56 AM)    Continuous Glucose Sensor (FREESTYLE LIBRE 3 SENSOR) [place] [id number] No Place 1 sensor on the skin every 14 days. Use to check glucose continuously  Patient not taking: Reported on 08/25/2022   [name], NP Not Taking Active            Med Note (  Frank Schneider A   Tue Aug 25, 2022  1:27 PM) Patient received CGM, has not started using  empagliflozin (JARDIANCE) 25 MG TABS tablet [id number] Yes Take 1 tablet (25 mg total) by mouth daily before breakfast. [name], NP Taking Active   ferrous sulfate 325 (65 FE) MG tablet [id number] No Take 1 tablet (325 mg total) by mouth daily with breakfast.  Patient not taking: Reported on 07/22/2022   [name], NP Not Taking  Active Self    Discontinued 06/22/22 1418 glucose blood test strip [id number] Yes Use as instructed [name], NP Taking Active   ibuprofen (ADVIL) 800 MG tablet [id number] No take 1 tablet (800 mg) by mouth every 8 hours as needed for pain with food  Patient not taking: Reported on 08/25/2022   [name], NP Not Taking Active   insulin glargine (LANTUS SOLOSTAR) 100 UNIT/ML Solostar Pen [id number] Yes Inject 30 Units into the skin at bedtime. [name], NP Taking Active   Insulin Pen Needle (NOVOFINE) 30G X 8 MM MISC [id number] Yes Use to inject into the skin as needed as directed [name], NP Taking Active Self   Patient not taking:   Discontinued 06/22/22 1418 lisinopril (ZESTRIL) 20 MG tablet [id number] Yes Take 1 tablet (20 mg total) by mouth daily. [name], NP Taking Active   TRUEplus Lancets 33G MISC [id number] Yes 1 Units by Does not apply route in the morning and at bedtime. 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   Acute appendicitis 12/16/2015   Right cataract 10/04/2015   Tobacco dependence 10/04/2015   Absolute anemia 08/07/2015   Neuropathy 08/07/2015   Cerebrovascular accident (CVA) (HCC)    Leukocytosis    Stroke (cerebrum) (HCC) 05/09/2015   Type 2 diabetes mellitus with circulatory disorder (HCC) 03/12/2015   Hyperlipidemia 02/14/2015   History of CVA (cerebrovascular accident) 02/14/2015   CVA (cerebral vascular accident) (HCC) 01/01/2015   Diabetes mellitus type 2 with  complications, uncontrolled 01/01/2015   Stroke (HCC)    Essential hypertension    Smoker    Tobacco use     Conditions to be addressed/monitored per PCP order:  DMII  Care Plan : RN Care Manager Plan of Care  Updates made by [name], RN since 08/25/2022 12:00 AM     Problem: Health Management needs related to DM      Long-Range Goal: Development of Plan of Care to address Health Management needs related to DM   Start Date: 07/23/2022  Expected End Date: 10/21/2022  Note:   Current Barriers:  Chronic Disease Management support and education needs related to DMII  RNCM Clinical Goal(s):  Patient will verbalize understanding of plan for management of DMII as evidenced by patient reports take all medications exactly as prescribed and will call provider for medication related questions as evidenced by patient reports and documentation in EMR    attend all scheduled medical appointments: 09/03/22 with Pharmacist and 09/23/22 with PCP as  evidenced by provider documentation        through collaboration with RN Care manager, provider, and care team.   Interventions: Inter-disciplinary care team collaboration (see longitudinal plan of care) Evaluation of current treatment plan related to  self management and patient's adherence to plan as established by provider   Diabetes:  (Status: Goal on Track (progressing): YES.) Long Term Goal   Lab Results  Component Value Date   HGBA1C 10.0 (H) 06/22/2022   @ Assessed patient's understanding of A1c goal: <7% Provided education to patient about basic DM disease process; Reviewed medications with patient and discussed importance of medication adherence;        Reviewed prescribed diet with patient diabetic diet; Discussed plans with patient for ongoing care management follow up and provided patient with direct contact information for care management team;      Reviewed scheduled/upcoming provider appointments including: Pharmacist on 09/03/22  and PCP on 09/23/22;         Review of patient status, including review of consultants reports, relevant laboratory and other test results, and medications completed;       Assessed social determinant of health barriers;        Provided patient with [facility] medical transportation [phone] Discussed the benefits of exercise to manage BS Discussed member benefits offered by [facility] for GYM membership, provided with www.youronepass.com to get a code for free GYM membership at participating locations Advised patient to take CGM and medications to upcoming appointments Collaborate with PCP, patient requesting refill on Ibuprofen for low back pain  Patient Goals/Self-Care Activities: Take medications as prescribed   Attend all scheduled provider appointments Call pharmacy for medication refills 3-7 days in advance of running out of medications Call provider office for new concerns or questions  schedule appointment with eye doctor fill half of plate with vegetables manage portion size Exercise 30 minutes a day       Follow Up:  Patient agrees to Care Plan and Follow-up.  Plan: The Managed Medicaid care management team will reach out to the patient again over the next 30 days.  Date/time of next scheduled RN care management/care coordination outreach:  09/25/22 @ 2:30pm  [name] RN, BSN [place]  Managed [facility] RN Care Coordinator [phone]

## 2022-08-26 DIAGNOSIS — I63311 Cerebral infarction due to thrombosis of right middle cerebral artery: Secondary | ICD-10-CM | POA: Diagnosis not present

## 2022-08-27 ENCOUNTER — Other Ambulatory Visit: Payer: Self-pay

## 2022-08-27 DIAGNOSIS — I63311 Cerebral infarction due to thrombosis of right middle cerebral artery: Secondary | ICD-10-CM | POA: Diagnosis not present

## 2022-08-27 DIAGNOSIS — M545 Low back pain, unspecified: Secondary | ICD-10-CM

## 2022-08-27 NOTE — Telephone Encounter (Signed)
Pt called to request ibuprofen be filled. Please advise [facility]

## 2022-08-28 ENCOUNTER — Other Ambulatory Visit: Payer: Self-pay | Admitting: Nurse Practitioner

## 2022-08-28 DIAGNOSIS — I63311 Cerebral infarction due to thrombosis of right middle cerebral artery: Secondary | ICD-10-CM | POA: Diagnosis not present

## 2022-08-28 DIAGNOSIS — G8929 Other chronic pain: Secondary | ICD-10-CM

## 2022-08-28 MED ORDER — IBUPROFEN 800 MG PO TABS: ORAL_TABLET | ORAL | 0 refills | Status: DC

## 2022-08-29 DIAGNOSIS — I63311 Cerebral infarction due to thrombosis of right middle cerebral artery: Secondary | ICD-10-CM | POA: Diagnosis not present

## 2022-08-30 DIAGNOSIS — I63311 Cerebral infarction due to thrombosis of right middle cerebral artery: Secondary | ICD-10-CM | POA: Diagnosis not present

## 2022-08-31 DIAGNOSIS — I63311 Cerebral infarction due to thrombosis of right middle cerebral artery: Secondary | ICD-10-CM | POA: Diagnosis not present

## 2022-09-01 DIAGNOSIS — I63311 Cerebral infarction due to thrombosis of right middle cerebral artery: Secondary | ICD-10-CM | POA: Diagnosis not present

## 2022-09-02 ENCOUNTER — Telehealth: Payer: Self-pay | Admitting: Clinical

## 2022-09-02 DIAGNOSIS — I63311 Cerebral infarction due to thrombosis of right middle cerebral artery: Secondary | ICD-10-CM | POA: Diagnosis not present

## 2022-09-02 NOTE — Telephone Encounter (Signed)
Integrated Behavioral Health Progress Note  09/02/2022 Name: Dolly Deyo MRN: [id number] DOB: Apr 27, 1976 Yeremy Trebing is a 47 y.o. year old male who sees [name], NP for primary care. LCSW was initially consulted to assist patient with community resources.  Interpreter: No.   Interpreter Name & Language: none  Assessment: Patient is experiencing financial difficulty related to low income. He is also in need of additional community resources.  Ongoing Intervention: CSW called patient and reminded him of his appointment with pharmacist tomorrow for education on his [place]. Have already arranged transportation for this appointment. Patient reported he missed his eye doctor appointment yesterday. Advised patient to call them and reschedule and then CSW can assist with arranging transportation to that appointment.  Abigail Butts, LCSW Patient Care Center [facility] [phone]

## 2022-09-03 ENCOUNTER — Ambulatory Visit (INDEPENDENT_AMBULATORY_CARE_PROVIDER_SITE_OTHER): Payer: Medicaid Other | Admitting: Pharmacist

## 2022-09-03 ENCOUNTER — Other Ambulatory Visit (HOSPITAL_COMMUNITY): Payer: Self-pay

## 2022-09-03 ENCOUNTER — Other Ambulatory Visit: Payer: Medicaid Other | Admitting: Pharmacist

## 2022-09-03 ENCOUNTER — Other Ambulatory Visit: Payer: Self-pay

## 2022-09-03 ENCOUNTER — Other Ambulatory Visit: Payer: Self-pay | Admitting: Vascular Surgery

## 2022-09-03 DIAGNOSIS — I10 Essential (primary) hypertension: Secondary | ICD-10-CM | POA: Diagnosis not present

## 2022-09-03 DIAGNOSIS — I63311 Cerebral infarction due to thrombosis of right middle cerebral artery: Secondary | ICD-10-CM | POA: Diagnosis not present

## 2022-09-03 DIAGNOSIS — E785 Hyperlipidemia, unspecified: Secondary | ICD-10-CM | POA: Diagnosis not present

## 2022-09-03 DIAGNOSIS — E1165 Type 2 diabetes mellitus with hyperglycemia: Secondary | ICD-10-CM

## 2022-09-03 MED ORDER — LISINOPRIL 20 MG PO TABS
20.0000 mg | ORAL_TABLET | Freq: Every day | ORAL | 1 refills | Status: DC
Start: 2022-09-03 — End: 2022-11-16
  Filled 2022-09-03 (×2): qty 90, 90d supply, fill #0
  Filled 2022-10-07 (×2): qty 30, 30d supply, fill #0

## 2022-09-03 MED ORDER — LANTUS SOLOSTAR 100 UNIT/ML ~~LOC~~ SOPN
16.0000 [IU] | PEN_INJECTOR | Freq: Every day | SUBCUTANEOUS | 2 refills | Status: DC
Start: 2022-09-03 — End: 2023-01-07
  Filled 2022-09-03: qty 12, 75d supply, fill #0
  Filled 2022-11-20 – 2022-11-27 (×2): qty 12, 75d supply, fill #1

## 2022-09-03 MED ORDER — CILOSTAZOL 100 MG PO TABS
100.0000 mg | ORAL_TABLET | Freq: Two times a day (BID) | ORAL | 11 refills | Status: DC
  Filled 2022-09-03 – 2022-10-07 (×3): qty 60, 30d supply, fill #0
  Filled 2022-11-19 – 2022-11-20 (×2): qty 60, 30d supply, fill #1
  Filled 2022-12-17: qty 60, 30d supply, fill #2
  Filled 2023-01-21: qty 60, 30d supply, fill #3
  Filled 2023-02-15: qty 60, 30d supply, fill #4
  Filled 2023-03-24: qty 60, 30d supply, fill #5
  Filled 2023-04-14 – 2023-04-19 (×2): qty 60, 30d supply, fill #6
  Filled 2023-05-10 – 2023-05-18 (×3): qty 60, 30d supply, fill #7
  Filled 2023-06-04 – 2023-06-10 (×2): qty 60, 30d supply, fill #8
  Filled 2023-06-28 – 2023-07-06 (×2): qty 60, 30d supply, fill #9
  Filled 2023-07-15 – 2023-08-03 (×4): qty 60, 30d supply, fill #10
  Filled 2023-08-17 – 2023-09-01 (×3): qty 60, 30d supply, fill #11

## 2022-09-03 MED ORDER — PEN NEEDLES 32G X 4 MM MISC
2 refills | Status: DC
  Filled 2022-09-03 – 2022-10-07 (×2): qty 100, 90d supply, fill #0

## 2022-09-03 MED ORDER — ATORVASTATIN CALCIUM 40 MG PO TABS
40.0000 mg | ORAL_TABLET | Freq: Every day | ORAL | 1 refills | Status: DC
Start: 2022-09-03 — End: 2023-01-07
  Filled 2022-09-03 (×2): qty 90, 90d supply, fill #0
  Filled 2022-10-07 (×2): qty 30, 30d supply, fill #0
  Filled 2022-11-19 – 2022-11-20 (×2): qty 30, 30d supply, fill #1
  Filled 2022-12-17: qty 30, 30d supply, fill #2

## 2022-09-03 MED ORDER — AMLODIPINE BESYLATE 10 MG PO TABS
10.0000 mg | ORAL_TABLET | Freq: Every day | ORAL | 1 refills | Status: DC
  Filled 2022-09-03 – 2022-10-07 (×4): qty 30, 30d supply, fill #0
  Filled 2022-11-19 – 2022-11-20 (×2): qty 30, 30d supply, fill #1
  Filled 2022-12-17: qty 30, 30d supply, fill #2

## 2022-09-03 MED ORDER — EMPAGLIFLOZIN 25 MG PO TABS
25.0000 mg | ORAL_TABLET | Freq: Every day | ORAL | 2 refills | Status: DC
  Filled 2022-09-03 – 2022-10-07 (×3): qty 30, 30d supply, fill #0

## 2022-09-03 MED ORDER — ASPIRIN 325 MG PO TABS
325.0000 mg | ORAL_TABLET | Freq: Every day | ORAL | 1 refills | Status: DC
  Filled 2022-09-03: qty 90, 90d supply, fill #0
  Filled 2022-09-03 – 2022-10-07 (×3): qty 30, 30d supply, fill #0
  Filled 2022-11-19 – 2022-11-20 (×2): qty 30, 30d supply, fill #1
  Filled 2022-12-17: qty 30, 30d supply, fill #2

## 2022-09-03 MED ORDER — FREESTYLE LIBRE 3 SENSOR MISC
1 refills | Status: DC
  Filled 2022-09-03 – 2022-10-07 (×2): qty 2, 28d supply, fill #0
  Filled 2022-11-20: qty 2, 28d supply, fill #1
  Filled 2022-12-14: qty 2, 28d supply, fill #2
  Filled 2023-01-11: qty 2, 28d supply, fill #3
  Filled 2023-02-08: qty 2, 28d supply, fill #4
  Filled 2023-03-03: qty 2, 28d supply, fill #5

## 2022-09-03 NOTE — Progress Notes (Signed)
09/03/2022 Name: Frank Schneider MRN: [id number] DOB: 1975-09-30  Chief Complaint  Patient presents with   Medication Management   Diabetes    Frank Schneider is a 47 y.o. year old male who was referred for medication management by their primary care provider, Frank Andrew, NP. They presented for a face to face visit today.   They were referred to the pharmacist by their PCP for assistance in managing diabetes, hypertension, and hyperlipidemia   Patient is participating in a Managed Medicaid Plan:  Yes  Subjective:  Care Team: Primary Care Provider: [name], NP ; Next Scheduled Visit: 09/23/22  Medication Access/Adherence  Current Pharmacy:  [facility] - [place], [place] - [address] [place] [address] Phone: [phone] Fax: [phone]   Patient reports affordability concerns with their medications: No  Patient reports access/transportation concerns to their pharmacy: No  Patient reports adherence concerns with their medications:  Yes    Reports he would like to start using adherence packaging   Diabetes:  Current medications: Jardiance 25 mg daily, Lantus 20 units daily Medications tried in the past: metformin XR 500 mg - loose stools even with 1 pill daily  Current glucose readings: has not been checking. Brought CGM supplies today.   Patient reports hypoglycemic s/sx including dizziness, shakiness, sweating, happened this morning before eating   Hypertension:  Current medications: lisinopril 20 mg daily, amlodipine 10 mg daily - though amlodipine has not been filled in several months per fill history.    Patient has a validated, automated, upper arm home BP cuff Current blood pressure readings readings: has not checked recently.    Hyperlipidemia/ASCVD Risk Reduction  Current lipid lowering medications: atorvastatin 40 mg daily  Antiplatelet regimen: aspirin 325 mg daily  Additionally on  cilostazol 100 mg twice daily    Objective:  Lab Results  Component Value Date   HGBA1C 10.0 (H) 06/22/2022    Lab Results  Component Value Date   CREATININE 0.99 06/22/2022   BUN 8 06/22/2022   NA 141 06/22/2022   K 4.0 06/22/2022   CL 101 06/22/2022   CO2 25 06/22/2022    Lab Results  Component Value Date   CHOL 88 (L) 06/22/2022   HDL 30 (L) 06/22/2022   LDLCALC 43 06/22/2022   TRIG 67 06/22/2022   CHOLHDL 2.9 06/22/2022    Medications Reviewed Today     Reviewed by Frank Schneider, RPH-CPP (Pharmacist) on 09/03/22 at 1649  Med List Status: <None>   Medication Order Taking? Sig Documenting Provider Last Dose Status Informant  acetaminophen (TYLENOL) 500 MG tablet [id number]  Take 2 tablets (Total= 1,000 mg), by mouth, 2 times a day as needed.  Patient not taking: Reported on 08/25/2022   Frank Angst, MD  Active Self  amLODipine (NORVASC) 10 MG tablet [id number]  Take 1 tablet (10 mg total) by mouth daily. Frank Andrew, NP  Active   aspirin 325 MG tablet [id number]  Take 1 tablet (325 mg total) by mouth daily. Frank Andrew, NP  Active   atorvastatin (LIPITOR) 40 MG tablet [id number]  Take 1 tablet (40 mg total) by mouth daily. Frank Andrew, NP  Active    Patient not taking:   Discontinued 06/22/22 1418 Blood Glucose Monitoring Suppl (TRUE METRIX AIR GLUCOSE METER) [place] [id number] No 1 Units by Does not apply route 2 (two) times daily.  Patient not taking: Reported on 09/03/2022   Frank Andrew, NP  Not Taking Active   cilostazol (PLETAL) 100 MG tablet [id number]  Take 1 tablet (100 mg total) by mouth 2 (two) times daily before a meal. Frank Shelling, MD  Active   Continuous Glucose Sensor (FREESTYLE LIBRE 3 SENSOR) [place] [id number]  Place 1 sensor on the skin every 14 days. Use to check glucose continuously Frank Andrew, NP  Active   empagliflozin (JARDIANCE) 25 MG TABS tablet [id number]  Take 1 tablet (25 mg total) by mouth daily before  breakfast. Frank Andrew, NP  Active     Discontinued 06/22/22 1418 glucose blood test strip [id number]  Use as instructed Frank Andrew, NP  Active   insulin glargine (LANTUS SOLOSTAR) 100 UNIT/ML Solostar Pen [id number]  Inject 16 Units into the skin at bedtime. Frank Andrew, NP  Active   Insulin Pen Needle (PEN NEEDLES) 32G X 4 MM MISC [id number] Yes Use to inject insulin daily as directed. Frank Andrew, NP  Active    Patient not taking:   Discontinued 06/22/22 1418 lisinopril (ZESTRIL) 20 MG tablet [id number]  Take 1 tablet (20 mg total) by mouth daily. Frank Andrew, NP  Active               Assessment/Plan:   Diabetes: - Currently uncontrolled - Reviewed long term cardiovascular and renal outcomes of uncontrolled blood sugar - Reviewed goal A1c, goal fasting, and goal 2 hour post prandial glucose - Educated on CGM, including placement and interpretation today. Assisted in connecting to clinic [place] account.  - Recommend to continue current regimen.    Hypertension: - Currently uncontrolled, though suspect amlodipine nonadherence due to fill history.  - Reviewed long term cardiovascular and renal outcomes of uncontrolled blood pressure - Reviewed appropriate blood pressure monitoring technique and reviewed goal blood pressure. Recommended to check home blood pressure and heart rate daily - Recommend to continue current regimen with focus on adherence with adherence packaging. Follow up at PCP visit in ~ 4 weeks.   Hyperlipidemia/ASCVD Risk Reduction: - Currently controlled.  - Recommend to continue current regimen   Follow Up Plan: phone call in 6 weeks  Frank Schneider, PharmD, BCACP, CPP [facility] [phone]

## 2022-09-03 NOTE — Patient Instructions (Addendum)
Frank Schneider,   We'll get you set up for Pill Packing from our Pharmacy at [facility]. Please call them when you can to put a card on file for copays (when you are able to fill them). [phone]  DECREASE Lantus to 16 units daily.   Check some blood pressures at home over the next few weeks. Please bring those readings with you to your next appointment.   Please call me with any questions or concerns!  Catie Eppie Gibson, PharmD, BCACP, CPP [facility] [phone]

## 2022-09-04 ENCOUNTER — Encounter: Payer: Self-pay | Admitting: Pharmacist

## 2022-09-04 ENCOUNTER — Other Ambulatory Visit: Payer: Self-pay

## 2022-09-04 ENCOUNTER — Other Ambulatory Visit (HOSPITAL_COMMUNITY): Payer: Self-pay

## 2022-09-04 DIAGNOSIS — I63311 Cerebral infarction due to thrombosis of right middle cerebral artery: Secondary | ICD-10-CM | POA: Diagnosis not present

## 2022-09-05 DIAGNOSIS — I63311 Cerebral infarction due to thrombosis of right middle cerebral artery: Secondary | ICD-10-CM | POA: Diagnosis not present

## 2022-09-06 DIAGNOSIS — R3981 Functional urinary incontinence: Secondary | ICD-10-CM | POA: Diagnosis not present

## 2022-09-06 DIAGNOSIS — I63311 Cerebral infarction due to thrombosis of right middle cerebral artery: Secondary | ICD-10-CM | POA: Diagnosis not present

## 2022-09-06 DIAGNOSIS — E119 Type 2 diabetes mellitus without complications: Secondary | ICD-10-CM | POA: Diagnosis not present

## 2022-09-07 ENCOUNTER — Telehealth: Payer: Self-pay | Admitting: Clinical

## 2022-09-07 DIAGNOSIS — I63311 Cerebral infarction due to thrombosis of right middle cerebral artery: Secondary | ICD-10-CM | POA: Diagnosis not present

## 2022-09-07 NOTE — Telephone Encounter (Signed)
Integrated Behavioral Health Progress Note  09/07/2022 Name: Chaitanya Mullinax MRN: [id number] DOB: [birth year] Olanrewaju Ortel is a 47 y.o. year old male who sees [name], NP for primary care. LCSW was initially consulted to assist patient with community resources.  Interpreter: No.   Interpreter Name & Language: none  Assessment: Patient is experiencing financial difficulty related to low income. He is also in need of additional community resources.  Ongoing Intervention: Patient left voice mail while CSW was out of office 09/04/22, indicating that he had scheduled his eye doctor appointment for 5/14 and needs transportation assistance. As there was not enough notice to schedule [employer] transportation, CSW had eye doctor appointment rescheduled for 5/21. Scheduled ride with [employer] for that date. Have also scheduled ride with [employer] for patient's PCP visit here on 5/29.  Abigail Butts, LCSW Patient Care Center [facility] [phone]

## 2022-09-08 DIAGNOSIS — I63311 Cerebral infarction due to thrombosis of right middle cerebral artery: Secondary | ICD-10-CM | POA: Diagnosis not present

## 2022-09-09 ENCOUNTER — Other Ambulatory Visit: Payer: Self-pay

## 2022-09-09 DIAGNOSIS — I63311 Cerebral infarction due to thrombosis of right middle cerebral artery: Secondary | ICD-10-CM | POA: Diagnosis not present

## 2022-09-10 DIAGNOSIS — I63311 Cerebral infarction due to thrombosis of right middle cerebral artery: Secondary | ICD-10-CM | POA: Diagnosis not present

## 2022-09-11 DIAGNOSIS — I63311 Cerebral infarction due to thrombosis of right middle cerebral artery: Secondary | ICD-10-CM | POA: Diagnosis not present

## 2022-09-12 DIAGNOSIS — I63311 Cerebral infarction due to thrombosis of right middle cerebral artery: Secondary | ICD-10-CM | POA: Diagnosis not present

## 2022-09-13 DIAGNOSIS — I63311 Cerebral infarction due to thrombosis of right middle cerebral artery: Secondary | ICD-10-CM | POA: Diagnosis not present

## 2022-09-14 DIAGNOSIS — I63311 Cerebral infarction due to thrombosis of right middle cerebral artery: Secondary | ICD-10-CM | POA: Diagnosis not present

## 2022-09-15 DIAGNOSIS — I63311 Cerebral infarction due to thrombosis of right middle cerebral artery: Secondary | ICD-10-CM | POA: Diagnosis not present

## 2022-09-15 DIAGNOSIS — Z9001 Acquired absence of eye: Secondary | ICD-10-CM | POA: Diagnosis not present

## 2022-09-16 DIAGNOSIS — I63311 Cerebral infarction due to thrombosis of right middle cerebral artery: Secondary | ICD-10-CM | POA: Diagnosis not present

## 2022-09-17 ENCOUNTER — Telehealth: Payer: Self-pay | Admitting: Clinical

## 2022-09-17 DIAGNOSIS — I63311 Cerebral infarction due to thrombosis of right middle cerebral artery: Secondary | ICD-10-CM | POA: Diagnosis not present

## 2022-09-17 NOTE — Telephone Encounter (Signed)
Integrated Behavioral Health Progress Note  09/17/2022 Name: Martinez Waterfield MRN: [id number] DOB: 1975-06-20 Kaylen Casamento is a 47 y.o. year old male who sees [name], NP for primary care. LCSW was initially consulted to assist patient with community resources.  Interpreter: No.   Interpreter Name & Language: none  Assessment: Patient is experiencing financial difficulty related to low income. He is also in need of additional community resources.  Ongoing Intervention: Patient requested assistance with scheduling transportation to an appointment for eye prosthetic. Arranged this transportation via [employer] for patient.  Abigail Butts, LCSW Patient Care Center [facility] [phone]

## 2022-09-18 DIAGNOSIS — I63311 Cerebral infarction due to thrombosis of right middle cerebral artery: Secondary | ICD-10-CM | POA: Diagnosis not present

## 2022-09-19 DIAGNOSIS — I63311 Cerebral infarction due to thrombosis of right middle cerebral artery: Secondary | ICD-10-CM | POA: Diagnosis not present

## 2022-09-20 DIAGNOSIS — I63311 Cerebral infarction due to thrombosis of right middle cerebral artery: Secondary | ICD-10-CM | POA: Diagnosis not present

## 2022-09-21 DIAGNOSIS — I63311 Cerebral infarction due to thrombosis of right middle cerebral artery: Secondary | ICD-10-CM | POA: Diagnosis not present

## 2022-09-21 DIAGNOSIS — E119 Type 2 diabetes mellitus without complications: Secondary | ICD-10-CM | POA: Diagnosis not present

## 2022-09-22 ENCOUNTER — Other Ambulatory Visit: Payer: Self-pay

## 2022-09-22 DIAGNOSIS — I63311 Cerebral infarction due to thrombosis of right middle cerebral artery: Secondary | ICD-10-CM | POA: Diagnosis not present

## 2022-09-22 NOTE — Progress Notes (Signed)
   Javed Juhasz [birth year] [id number]  Patient outreached by Seward Meth , PharmD Candidate on 09/22/22.  Pt stated he was interested in having his medication blister packaged and mailed to him to improve adherence.   Blood Pressure Readings: Last documented ambulatory systolic blood pressure: 140 Last documented ambulatory diastolic blood pressure: 79 Does the patient have a validated home blood pressure machine?: Yes They report home readings: Does not document home readings but says she have been looking good in general -- advised him to write down future readings to bring to his appointments.   Medication review was performed. Is the patient taking their medications as prescribed?: Yes Differences from their prescribed list include:   The following barriers to adherence were noted: Does the patient have cost concerns?: No Does the patient have transportation concerns?: No Does the patient need assistance obtaining refills?: No Does the patient occassionally forget to take some of their prescribed medications?: Yes Does the patient feel like one/some of their medications make them feel poorly?: No Does the patient have questions or concerns about their medications?: Yes Does the patient have a follow up scheduled with their primary care provider/cardiologist?: Yes   Interventions: Interventions Completed: Medications were reviewed, Patient was educated on proper technique to check home blood pressure and reminded to bring home machine and readings to next provider appointment, Patient was educated on use of adherence strategies, like a pill box or alarms  The patient has follow up scheduled:  PCP: [name], NP   Seward Meth, Student-PharmD

## 2022-09-23 ENCOUNTER — Telehealth: Payer: Self-pay | Admitting: Clinical

## 2022-09-23 ENCOUNTER — Ambulatory Visit: Payer: Self-pay | Admitting: Nurse Practitioner

## 2022-09-23 DIAGNOSIS — I63311 Cerebral infarction due to thrombosis of right middle cerebral artery: Secondary | ICD-10-CM | POA: Diagnosis not present

## 2022-09-23 NOTE — Telephone Encounter (Signed)
Integrated Behavioral Health Progress Note  09/23/2022 Name: Frank Schneider MRN: [id number] DOB: [birth year] Frank Schneider is a 47 y.o. year old male who sees [name], NP for primary care. LCSW was initially consulted to assist patient with community resources.  Interpreter: No.   Interpreter Name & Language: none  Assessment: Patient is experiencing financial difficulty related to low income. He is also in need of additional community resources.  Ongoing Intervention: Patient called and wanted to reschedule his PCP appointment for today. CSW rescheduled appointment. Cancelled patient's transportation with [employer] and rescheduled it for new appointment 10/07/22.  Abigail Butts, LCSW Patient Care Center [facility] [phone]

## 2022-09-24 DIAGNOSIS — I63311 Cerebral infarction due to thrombosis of right middle cerebral artery: Secondary | ICD-10-CM | POA: Diagnosis not present

## 2022-09-25 ENCOUNTER — Encounter: Payer: Self-pay | Admitting: *Deleted

## 2022-09-25 ENCOUNTER — Other Ambulatory Visit: Payer: Medicaid Other | Admitting: *Deleted

## 2022-09-25 DIAGNOSIS — I63311 Cerebral infarction due to thrombosis of right middle cerebral artery: Secondary | ICD-10-CM | POA: Diagnosis not present

## 2022-09-25 NOTE — Patient Outreach (Signed)
Medicaid Managed Care   Nurse Care Manager Note  09/25/2022 Name:  Frank Schneider MRN:  [id number] DOB:  Sep 16, 1975  Frank Schneider is an 47 y.o. year old male who is a primary patient of Frank Andrew, NP.  The [facility] Managed Care Coordination team was consulted for assistance with:    HTN DMII  [name] was given information about Medicaid Managed Care Coordination team services today. Frank Schneider Patient agreed to services and verbal consent obtained.  Engaged with patient by telephone for follow up visit in response to provider referral for case management and/or care coordination services.   Assessments/Interventions:  Review of past medical history, allergies, medications, health status, including review of consultants reports, laboratory and other test data, was performed as part of comprehensive evaluation and provision of chronic care management services.  SDOH (Social Determinants of Health) assessments and interventions performed: SDOH Interventions    Flowsheet Row Patient Outreach Telephone from 09/25/2022 in [place] POPULATION HEALTH DEPARTMENT Patient Outreach Telephone from 08/25/2022 in [place] POPULATION HEALTH DEPARTMENT Patient Outreach Telephone from 07/23/2022 in [place] POPULATION HEALTH DEPARTMENT Telephone from 06/18/2022 in [facility] Clinical Support from 10/16/2021 [facility] Telephone from 09/03/2021 in [facility]  SDOH Interventions        Food Insecurity Interventions -- -- Other (Comment)  [BSW referral for food insecurity] -- -- --  Housing Interventions -- -- Intervention Not Indicated -- -- --  Transportation Interventions Payor Benefit Payor Benefit Payor Benefit Taxi Voucher Given Conservation officer, nature Given Payor Benefit  Utilities Interventions -- -- Intervention Not Indicated -- -- --       Care Plan  No Known Allergies  Medications Reviewed Today     Reviewed by  Frank Dach, RN (Registered Nurse) on 09/25/22 at 1431  Med List Status: <None>   Medication Order Taking? Sig Documenting Provider Last Dose Status Informant  acetaminophen (TYLENOL) 500 MG tablet [id number] Yes Take 2 tablets (Total= 1,000 mg), by mouth, 2 times a day as needed. [name], MD Taking Active Self  amLODipine (NORVASC) 10 MG tablet [id number] Yes Take 1 tablet (10 mg total) by mouth daily. Frank Andrew, NP Taking Active   aspirin 325 MG tablet [id number] Yes Take 1 tablet (325 mg total) by mouth daily. Frank Andrew, NP Taking Active   atorvastatin (LIPITOR) 40 MG tablet [id number] Yes Take 1 tablet (40 mg total) by mouth daily. Frank Andrew, NP Taking Active    Patient not taking:   Discontinued 06/22/22 1418 Blood Glucose Monitoring Suppl (TRUE METRIX AIR GLUCOSE METER) DEVI [id number] Yes 1 Units by Does not apply route 2 (two) times daily. Frank Andrew, NP Taking Active   cilostazol (PLETAL) 100 MG tablet [id number] Yes Take 1 tablet (100 mg total) by mouth 2 (two) times daily before a meal. [name], MD Taking Active   Continuous Glucose Sensor (FREESTYLE LIBRE 3 [place]) [place] [id number] Yes Place 1 sensor on the skin every 14 days. Use to check glucose continuously Frank Andrew, NP Taking Active   empagliflozin (JARDIANCE) 25 MG TABS tablet [id number] Yes Take 1 tablet (25 mg total) by mouth daily before breakfast. Frank Andrew, NP Taking Active     Discontinued 06/22/22 1418 glucose blood test strip [id number] Yes Use as instructed Frank Andrew, NP Taking Active   ibuprofen (ADVIL) 800 MG tablet [id number] Yes Take 800 mg by mouth every 8 (  eight) hours as needed. [provider] Taking Active   insulin glargine (LANTUS SOLOSTAR) 100 UNIT/ML Solostar Pen [id number] Yes Inject 16 Units into the skin at bedtime. Frank Andrew, NP Taking Active   Insulin Pen Needle (PEN NEEDLES) 32G X 4 MM MISC [id number] Yes Use to inject insulin  daily as directed. Frank Andrew, NP Taking Active    Patient not taking:   Discontinued 06/22/22 1418 lisinopril (ZESTRIL) 20 MG tablet [id number] Yes Take 1 tablet (20 mg total) by mouth daily. 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   Acute appendicitis 12/16/2015   Right cataract 10/04/2015   Tobacco dependence 10/04/2015   Absolute anemia 08/07/2015   Neuropathy 08/07/2015   Cerebrovascular accident (CVA) (HCC)    Leukocytosis    Stroke (cerebrum) (HCC) 05/09/2015   Type 2 diabetes mellitus with circulatory disorder (HCC) 03/12/2015   Hyperlipidemia 02/14/2015   History of CVA (cerebrovascular accident) 02/14/2015   CVA (cerebral vascular accident) (HCC) 01/01/2015   Diabetes mellitus type 2 with complications, uncontrolled 01/01/2015   Stroke (HCC)    Essential hypertension    Smoker    Tobacco use     Conditions to be addressed/monitored per PCP order:  HTN and DMII  Care Plan : RN Care Manager Plan of Care  Updates made by Frank Dach, RN since 09/25/2022 12:00 AM     Problem: Health Management needs related to DM      Long-Range Goal: Development of Plan of Care to address Health Management needs related to DM   Start Date: 07/23/2022  Expected End Date: 10/21/2022  Note:   Current Barriers:  Chronic Disease Management support and education needs related to DMII  RNCM Clinical Goal(s):  Patient will verbalize understanding of plan for management of DMII as evidenced by patient reports take all  medications exactly as prescribed and will call provider for medication related questions as evidenced by patient reports and documentation in EMR    attend all scheduled medical appointments: 09/03/22 with Pharmacist and 09/23/22 with PCP as evidenced by provider documentation        through collaboration with RN Care manager, provider, and care team.   Interventions: Inter-disciplinary care team collaboration (see longitudinal plan of care) Evaluation of current treatment plan related to  self management and patient's adherence to plan as established by provider   Hypertension Interventions:  (Status:  New goal.) Long Term Goal Last practice recorded BP readings:  BP Readings from Last 3 Encounters:  09/03/22 (!) 140/79  06/23/22 127/80  06/22/22 126/81  Most recent eGFR/CrCl:  Lab Results  Component Value Date   EGFR 95 06/22/2022    No components found for: "CRCL"  Evaluation of current treatment plan related to hypertension self management and patient's adherence to plan as established by provider Provided education to patient re: stroke prevention, s/s of heart attack and stroke Reviewed medications with patient and discussed importance of compliance Discussed plans with patient for ongoing care management follow up and provided  patient with direct contact information for care management team Reviewed scheduled/upcoming provider appointments including:  Discussed complications of poorly controlled blood pressure such as heart disease, stroke, circulatory complications, vision complications, kidney impairment, sexual dysfunction Discussed the importance of exercise and diet in controlling HTN  Diabetes:  (Status: Goal on Track (progressing): YES.) Long Term Goal   Lab Results  Component Value Date   HGBA1C 10.0 (H) 06/22/2022   @ Assessed patient's understanding of A1c goal: <7% Provided education to patient about basic DM disease process; Reviewed medications with patient and  discussed importance of medication adherence;        Reviewed prescribed diet with patient diabetic diet; Discussed plans with patient for ongoing care management follow up and provided patient with direct contact information for care management team;      Reviewed scheduled/upcoming provider appointments including:  PCP on 10/07/22;         Review of patient status, including review of consultants reports, relevant laboratory and other test results, and medications completed;       Assessed social determinant of health barriers;        Provided patient with [facility] medical transportation [phone] Discussed the benefits of exercise to manage BS Discussed member benefits offered by [facility] for GYM membership, provided with www.youronepass.com to get a code for free GYM membership at participating locations Advised patient to take CGM and medications to upcoming appointments   Patient Goals/Self-Care Activities: Take medications as prescribed   Attend all scheduled provider appointments Call pharmacy for medication refills 3-7 days in advance of running out of medications Call provider office for new concerns or questions  schedule appointment with eye doctor drink 6 to 8 glasses of water each day fill half of plate with vegetables manage portion size Exercise 30 minutes a day       Follow Up:  Patient agrees to Care Plan and Follow-up.  Plan: The Managed Medicaid care management team will reach out to the patient again over the next 30 days.  Date/time of next scheduled RN care management/care coordination outreach:  10/26/22 @ 2:30pm  [name] RN, BSN [place]  Managed [facility] RN Care Coordinator [phone]

## 2022-09-25 NOTE — Patient Instructions (Signed)
Visit Information  Frank Schneider was given information about Medicaid Managed Care team care coordination services as a part of their [facility] Community Plan Medicaid benefit. Frank Schneider verbally consented to engagement with the [facility] Managed Care team.   If you are experiencing a medical emergency, please call 911 or report to your local emergency department or urgent care.   If you have a non-emergency medical problem during routine business hours, please contact your provider's office and ask to speak with a nurse.   For questions related to your [facility], please call: [phone] or visit the homepage here: [email or web address]  If you would like to schedule transportation through your [facility], please call the following number at least 2 days in advance of your appointment: [phone]   Rides for urgent appointments can also be made after hours by calling Member Services.  Call the Behavioral Health Crisis Line at [phone], at any time, 24 hours a day, 7 days a week. If you are in danger or need immediate medical attention call 911.  If you would like help to quit smoking, call 1-800-QUIT-NOW ((279)009-4786) OR Espaol: 1-855-Djelo-Ya (9-518-841-6606) o para ms informacin haga clic aqu or Text READY to 301-601 to register via text  Frank Schneider,   Please see education materials related to HTN provided by MyChart link.  Patient verbalizes understanding of instructions and care plan provided today and agrees to view in MyChart. Active MyChart status and patient understanding of how to access instructions and care plan via MyChart confirmed with patient.     Telephone follow up appointment with Managed Medicaid care management team member scheduled for:10/26/22 @ 2:30 pm  Frank Emms RN, BSN [place]  Managed [facility] RN Care  Coordinator [phone]   Following is a copy of your plan of care:  Care Plan : RN Care Manager Plan of Care  Updates made by [name], RN since 09/25/2022 12:00 AM     Problem: Health Management needs related to DM      Long-Range Goal: Development of Plan of Care to address Health Management needs related to DM   Start Date: 07/23/2022  Expected End Date: 10/21/2022  Note:   Current Barriers:  Chronic Disease Management support and education needs related to DMII  RNCM Clinical Goal(s):  Patient will verbalize understanding of plan for management of DMII as evidenced by patient reports take all medications exactly as prescribed and will call provider for medication related questions as evidenced by patient reports and documentation in EMR    attend all scheduled medical appointments: 09/03/22 with Pharmacist and 09/23/22 with PCP as evidenced by provider documentation        through collaboration with RN Care manager, provider, and care team.   Interventions: Inter-disciplinary care team collaboration (see longitudinal plan of care) Evaluation of current treatment plan related to  self management and patient's adherence to plan as established by provider   Hypertension Interventions:  (Status:  New goal.) Long Term Goal Last practice recorded BP readings:  BP Readings from Last 3 Encounters:  09/03/22 (!) 140/79  06/23/22 127/80  06/22/22 126/81  Most recent eGFR/CrCl:  Lab Results  Component Value Date   EGFR 95 06/22/2022    No components found for: "CRCL"  Evaluation of current treatment plan related to hypertension self management and patient's adherence to plan as established by provider Provided education to patient re: stroke prevention, s/s of heart attack and stroke Reviewed medications  with patient and discussed importance of compliance Discussed plans with patient for ongoing care management follow up and provided patient with direct contact information for  care management team Reviewed scheduled/upcoming provider appointments including:  Discussed complications of poorly controlled blood pressure such as heart disease, stroke, circulatory complications, vision complications, kidney impairment, sexual dysfunction Discussed the importance of exercise and diet in controlling HTN  Diabetes:  (Status: Goal on Track (progressing): YES.) Long Term Goal   Lab Results  Component Value Date   HGBA1C 10.0 (H) 06/22/2022   @ Assessed patient's understanding of A1c goal: <7% Provided education to patient about basic DM disease process; Reviewed medications with patient and discussed importance of medication adherence;        Reviewed prescribed diet with patient diabetic diet; Discussed plans with patient for ongoing care management follow up and provided patient with direct contact information for care management team;      Reviewed scheduled/upcoming provider appointments including:  PCP on 10/07/22;         Review of patient status, including review of consultants reports, relevant laboratory and other test results, and medications completed;       Assessed social determinant of health barriers;        Provided patient with [facility] medical transportation [phone] Discussed the benefits of exercise to manage BS Discussed member benefits offered by [facility] for GYM membership, provided with www.youronepass.com to get a code for free GYM membership at participating locations Advised patient to take CGM and medications to upcoming appointments   Patient Goals/Self-Care Activities: Take medications as prescribed   Attend all scheduled provider appointments Call pharmacy for medication refills 3-7 days in advance of running out of medications Call provider office for new concerns or questions  schedule appointment with eye doctor drink 6 to 8 glasses of water each day fill half of plate with vegetables manage portion size Exercise 30 minutes a day

## 2022-09-26 DIAGNOSIS — I63311 Cerebral infarction due to thrombosis of right middle cerebral artery: Secondary | ICD-10-CM | POA: Diagnosis not present

## 2022-09-27 DIAGNOSIS — I63311 Cerebral infarction due to thrombosis of right middle cerebral artery: Secondary | ICD-10-CM | POA: Diagnosis not present

## 2022-09-28 DIAGNOSIS — I63311 Cerebral infarction due to thrombosis of right middle cerebral artery: Secondary | ICD-10-CM | POA: Diagnosis not present

## 2022-09-29 DIAGNOSIS — I63311 Cerebral infarction due to thrombosis of right middle cerebral artery: Secondary | ICD-10-CM | POA: Diagnosis not present

## 2022-09-30 DIAGNOSIS — I63311 Cerebral infarction due to thrombosis of right middle cerebral artery: Secondary | ICD-10-CM | POA: Diagnosis not present

## 2022-10-01 DIAGNOSIS — I63311 Cerebral infarction due to thrombosis of right middle cerebral artery: Secondary | ICD-10-CM | POA: Diagnosis not present

## 2022-10-02 DIAGNOSIS — I63311 Cerebral infarction due to thrombosis of right middle cerebral artery: Secondary | ICD-10-CM | POA: Diagnosis not present

## 2022-10-03 DIAGNOSIS — I63311 Cerebral infarction due to thrombosis of right middle cerebral artery: Secondary | ICD-10-CM | POA: Diagnosis not present

## 2022-10-04 DIAGNOSIS — I63311 Cerebral infarction due to thrombosis of right middle cerebral artery: Secondary | ICD-10-CM | POA: Diagnosis not present

## 2022-10-05 DIAGNOSIS — I63311 Cerebral infarction due to thrombosis of right middle cerebral artery: Secondary | ICD-10-CM | POA: Diagnosis not present

## 2022-10-06 DIAGNOSIS — I63311 Cerebral infarction due to thrombosis of right middle cerebral artery: Secondary | ICD-10-CM | POA: Diagnosis not present

## 2022-10-06 DIAGNOSIS — R3981 Functional urinary incontinence: Secondary | ICD-10-CM | POA: Diagnosis not present

## 2022-10-06 DIAGNOSIS — E119 Type 2 diabetes mellitus without complications: Secondary | ICD-10-CM | POA: Diagnosis not present

## 2022-10-07 ENCOUNTER — Other Ambulatory Visit: Payer: Self-pay

## 2022-10-07 ENCOUNTER — Encounter: Payer: Self-pay | Admitting: Nurse Practitioner

## 2022-10-07 ENCOUNTER — Other Ambulatory Visit: Payer: Medicaid Other | Admitting: Pharmacist

## 2022-10-07 ENCOUNTER — Other Ambulatory Visit (HOSPITAL_COMMUNITY): Payer: Self-pay

## 2022-10-07 ENCOUNTER — Ambulatory Visit (INDEPENDENT_AMBULATORY_CARE_PROVIDER_SITE_OTHER): Payer: Medicaid Other | Admitting: Nurse Practitioner

## 2022-10-07 VITALS — BP 138/64 | HR 58 | Temp 97.3°F | Wt 183.0 lb

## 2022-10-07 DIAGNOSIS — Z794 Long term (current) use of insulin: Secondary | ICD-10-CM

## 2022-10-07 DIAGNOSIS — Z1211 Encounter for screening for malignant neoplasm of colon: Secondary | ICD-10-CM | POA: Diagnosis not present

## 2022-10-07 DIAGNOSIS — E1159 Type 2 diabetes mellitus with other circulatory complications: Secondary | ICD-10-CM

## 2022-10-07 DIAGNOSIS — I63311 Cerebral infarction due to thrombosis of right middle cerebral artery: Secondary | ICD-10-CM | POA: Diagnosis not present

## 2022-10-07 LAB — POCT GLYCOSYLATED HEMOGLOBIN (HGB A1C): Hemoglobin A1C: 7.5 % — AB (ref 4.0–5.6)

## 2022-10-07 MED ORDER — PEN NEEDLES 32G X 4 MM MISC
2 refills | Status: DC
Start: 2022-10-07 — End: 2023-08-30
  Filled 2022-10-07: qty 100, 90d supply, fill #0
  Filled 2022-12-30 (×2): qty 100, 90d supply, fill #1
  Filled 2023-03-30 – 2023-03-31 (×2): qty 100, 90d supply, fill #2
  Filled 2023-03-31: qty 100, 34d supply, fill #2

## 2022-10-07 NOTE — Assessment & Plan Note (Signed)
-   POCT glycosylated hemoglobin (Hb A1C) - CBC - Comprehensive metabolic panel - Insulin Pen Needle (PEN NEEDLES) 32G X 4 MM MISC; Use to inject insulin daily as directed.  Dispense: 100 each; Refill: 2 -decrease Lantus to 16 units daily  2. Colon cancer screening  - Cologuard   Follow up:  Follow up in 3 months

## 2022-10-07 NOTE — Progress Notes (Signed)
@Patient  ID: Frank Schneider, male    DOB: May 15, 1975, 47 y.o.   MRN: [id number]  Chief Complaint  Patient presents with   Follow-up   Diabetes    Referring provider: [name], NP   HPI  Frank Schneider presents for foot pain. He  has a past medical history of Blind right eye, Diabetes mellitus, History of appendicitis (11/2015), Hypertension, Left flank pain (12/2019), Priapism, Stroke (HCC) (04/2015), Tobacco use, Vision abnormalities, and Vitamin D deficiency (12/2019).      Patient presents today for diabetic follow-up.  Patient does family have a nurse aide coming to his house daily to help him with ADLs.  He does have a continuous glucose meter. His A1c has improved but is still elevated at 7.5 today.  Needs to decrease lantus to 16 units. Currently taking 20 units. Blood sugars are dropping in the am's. Patient is compliant with medications.  He is followed by pharmacy for diabetic medication management.  They will follow-up with him in 2 weeks.  Denies f/c/s, n/v/d, hemoptysis, PND, leg swelling Denies chest pain or edema     No Known Allergies  Immunization History  Administered Date(s) Administered   Influenza,inj,Quad PF,6+ Mos 01/15/2015   PFIZER(Purple Top)SARS-COV-2 Vaccination 01/03/2020   Pneumococcal Polysaccharide-23 07/29/2011   Tdap 01/15/2015    Past Medical History:  Diagnosis Date   Blind right eye    Diabetes mellitus    Takes Metformin   History of appendicitis 11/2015   Hypertension    Left flank pain 12/2019   Priapism    Stroke (HCC) 04/2015   Tobacco use    Vision abnormalities    Vitamin D deficiency 12/2019    Tobacco History: Social History   Tobacco Use  Smoking Status Some Days   Packs/day: 1.00   Years: 8.00   Additional pack years: 0.00   Total pack years: 8.00   Types: Cigarettes  Smokeless Tobacco Never  Tobacco Comments   1 pack per day.   Ready to quit: Not Answered Counseling given: Not Answered Tobacco  comments: 1 pack per day.   Outpatient Encounter Medications as of 10/07/2022  Medication Sig   acetaminophen (TYLENOL) 500 MG tablet Take 2 tablets (Total= 1,000 mg), by mouth, 2 times a day as needed.   amLODipine (NORVASC) 10 MG tablet Take 1 tablet (10 mg total) by mouth daily.   aspirin 325 MG tablet Take 1 tablet (325 mg total) by mouth daily.   atorvastatin (LIPITOR) 40 MG tablet Take 1 tablet (40 mg total) by mouth daily.   Blood Glucose Monitoring Suppl (TRUE METRIX AIR GLUCOSE METER) DEVI 1 Units by Does not apply route 2 (two) times daily.   cilostazol (PLETAL) 100 MG tablet Take 1 tablet (100 mg total) by mouth 2 (two) times daily before a meal.   Continuous Glucose Sensor (FREESTYLE LIBRE 3 SENSOR) MISC Place 1 sensor on the skin every 14 days. Use to check glucose continuously   empagliflozin (JARDIANCE) 25 MG TABS tablet Take 1 tablet (25 mg total) by mouth daily before breakfast.   glucose blood test strip Use as instructed   ibuprofen (ADVIL) 800 MG tablet Take 800 mg by mouth every 8 (eight) hours as needed.   insulin glargine (LANTUS SOLOSTAR) 100 UNIT/ML Solostar Pen Inject 16 Units into the skin at bedtime.   lisinopril (ZESTRIL) 20 MG tablet Take 1 tablet (20 mg total) by mouth daily.   [DISCONTINUED] Insulin Pen Needle (PEN NEEDLES) 32G X 4 MM MISC  Use to inject insulin daily as directed.   Insulin Pen Needle (PEN NEEDLES) 32G X 4 MM MISC Use to inject insulin daily as directed.   [DISCONTINUED] Blood Glucose Monitoring Suppl (ACCU-CHEK GUIDE) w/Device KIT Use in the morning, at noon, in the evening, and at bedtime. (Patient not taking: Reported on 06/22/2022)   [DISCONTINUED] glucose blood (TRUE METRIX BLOOD GLUCOSE TEST) test strip Use as instructed   [DISCONTINUED] Lancets (FREESTYLE) lancets Use as instructed (Patient not taking: Reported on 06/22/2022)   No facility-administered encounter medications on file as of 10/07/2022.     Review of Systems  Review of  Systems  Constitutional: Negative.   HENT: Negative.    Cardiovascular: Negative.   Gastrointestinal: Negative.   Allergic/Immunologic: Negative.   Neurological: Negative.   Psychiatric/Behavioral: Negative.         Physical Exam  BP 138/64   Pulse (!) 58   Temp (!) 97.3 F (36.3 C)   Wt 183 lb (83 kg)   SpO2 100%   BMI 25.89 kg/m   Wt Readings from Last 5 Encounters:  10/07/22 183 lb (83 kg)  06/23/22 174 lb (78.9 kg)  06/22/22 174 lb (78.9 kg)  06/11/22 185 lb (83.9 kg)  05/27/22 185 lb (83.9 kg)     Physical Exam Vitals and nursing note reviewed.  Constitutional:      General: He is not in acute distress.    Appearance: He is well-developed.  Cardiovascular:     Rate and Rhythm: Normal rate and regular rhythm.  Pulmonary:     Effort: Pulmonary effort is normal.     Breath sounds: Normal breath sounds.  Skin:    General: Skin is warm and dry.  Neurological:     Mental Status: He is alert and oriented to person, place, and time.      Lab Results:  CBC    Component Value Date/Time   WBC 11.4 (H) 06/22/2022 1440   WBC 11.3 (H) 06/11/2022 1127   RBC 6.47 (H) 06/22/2022 1440   RBC 6.20 (H) 06/11/2022 1127   HGB 11.9 (L) 06/22/2022 1440   HCT 40.5 06/22/2022 1440   PLT 262 06/22/2022 1440   MCV 63 (L) 06/22/2022 1440   MCH 18.4 (L) 06/22/2022 1440   MCH 19.5 (L) 06/11/2022 1127   MCHC 29.4 (L) 06/22/2022 1440   MCHC 31.8 06/11/2022 1127   RDW 18.5 (H) 06/22/2022 1440   LYMPHSABS 3.7 (H) 06/16/2021 1435   MONOABS 0.7 09/18/2018 1430   EOSABS 0.3 06/16/2021 1435   BASOSABS 0.1 06/16/2021 1435    BMET    Component Value Date/Time   NA 141 06/22/2022 1440   K 4.0 06/22/2022 1440   CL 101 06/22/2022 1440   CO2 25 06/22/2022 1440   GLUCOSE 110 (H) 06/22/2022 1440   GLUCOSE 132 (H) 06/11/2022 1127   BUN 8 06/22/2022 1440   CREATININE 0.99 06/22/2022 1440   CREATININE 1.02 03/12/2017 1226   CALCIUM 9.4 06/22/2022 1440   GFRNONAA >60  06/11/2022 1127   GFRNONAA 91 03/12/2017 1226   GFRAA >60 01/21/2020 1650   GFRAA 105 03/12/2017 1226     Assessment & Plan:   Type 2 diabetes mellitus with circulatory disorder (HCC) - POCT glycosylated hemoglobin (Hb A1C) - CBC - Comprehensive metabolic panel - Insulin Pen Needle (PEN NEEDLES) 32G X 4 MM MISC; Use to inject insulin daily as directed.  Dispense: 100 each; Refill: 2 -decrease Lantus to 16 units daily  2. Colon cancer screening  -  Cologuard   Follow up:  Follow up in 3 months     [name], NP 10/07/2022

## 2022-10-07 NOTE — Patient Instructions (Addendum)
1. Type 2 diabetes mellitus with other circulatory complication, with long-term current use of insulin (HCC)  - POCT glycosylated hemoglobin (Hb A1C) - CBC - Comprehensive metabolic panel - Insulin Pen Needle (PEN NEEDLES) 32G X 4 MM MISC; Use to inject insulin daily as directed.  Dispense: 100 each; Refill: 2 -decrease Lantus to 16 units daily  2. Colon cancer screening  - Cologuard   Follow up:  Follow up in 3 months

## 2022-10-07 NOTE — Progress Notes (Signed)
Care Coordination Call  Patient saw PCP today. Improvement in A1c with some hypoglycemia.   Per CGM:   Date of Download: 5/30-6/12/24 % Time CGM is active: 63% Average Glucose: 176 mg/dL Glucose Management Indicator: 7.5  Glucose Variability: 34.4 (goal <36%) Time in Goal:  - Time in range 70-180: 57% - Time above range: 41% - Time below range: 2%   Discussed with PCP. Reduce Lantus from 20 units to 16 units daily. Continue Jardiance. Will discuss GLP1 at next follow up.   Coordinated with Pharmacy, they are processing adherence packaging and will ship tomorrow, including refill on [place] sensors.   Follow up in 4 weeks  Catie Eppie Gibson, PharmD, BCACP, CPP [facility], [facility] [phone]

## 2022-10-08 ENCOUNTER — Other Ambulatory Visit: Payer: Medicaid Other | Admitting: Pharmacist

## 2022-10-08 ENCOUNTER — Other Ambulatory Visit: Payer: Self-pay

## 2022-10-08 ENCOUNTER — Other Ambulatory Visit: Payer: Self-pay | Admitting: Nurse Practitioner

## 2022-10-08 ENCOUNTER — Telehealth: Payer: Self-pay | Admitting: Clinical

## 2022-10-08 DIAGNOSIS — I63311 Cerebral infarction due to thrombosis of right middle cerebral artery: Secondary | ICD-10-CM | POA: Diagnosis not present

## 2022-10-08 DIAGNOSIS — D72829 Elevated white blood cell count, unspecified: Secondary | ICD-10-CM

## 2022-10-08 LAB — COMPREHENSIVE METABOLIC PANEL
ALT: 19 IU/L (ref 0–44)
AST: 20 IU/L (ref 0–40)
Albumin/Globulin Ratio: 1.9
Albumin: 4.4 g/dL (ref 4.1–5.1)
Alkaline Phosphatase: 70 IU/L (ref 44–121)
BUN/Creatinine Ratio: 10 (ref 9–20)
BUN: 11 mg/dL (ref 6–24)
Bilirubin Total: 0.4 mg/dL (ref 0.0–1.2)
CO2: 26 mmol/L (ref 20–29)
Calcium: 8.8 mg/dL (ref 8.7–10.2)
Chloride: 103 mmol/L (ref 96–106)
Creatinine, Ser: 1.12 mg/dL (ref 0.76–1.27)
Globulin, Total: 2.3 g/dL (ref 1.5–4.5)
Glucose: 122 mg/dL — ABNORMAL HIGH (ref 70–99)
Potassium: 4.2 mmol/L (ref 3.5–5.2)
Sodium: 143 mmol/L (ref 134–144)
Total Protein: 6.7 g/dL (ref 6.0–8.5)
eGFR: 82 mL/min/{1.73_m2} (ref >59)

## 2022-10-08 LAB — CBC
Hematocrit: 43.5 % (ref 37.5–51.0)
Hemoglobin: 13.3 g/dL (ref 13.0–17.7)
MCH: 19.4 pg — ABNORMAL LOW (ref 26.6–33.0)
MCHC: 30.6 g/dL — ABNORMAL LOW (ref 31.5–35.7)
MCV: 64 fL — ABNORMAL LOW (ref 79–97)
Platelets: 221 10*3/uL (ref 150–450)
RBC: 6.85 x10E6/uL — ABNORMAL HIGH (ref 4.14–5.80)
RDW: 18.5 % — ABNORMAL HIGH (ref 11.6–15.4)
WBC: 13.9 10*3/uL — ABNORMAL HIGH (ref 3.4–10.8)

## 2022-10-08 NOTE — Telephone Encounter (Addendum)
Integrated Behavioral Health Progress Note  10/08/2022 Name: Jaydel Rittenhouse MRN: [id number] DOB: 19-Jan-1976 Frankin Wilkins is a 47 y.o. year old male who sees [name], NP for primary care. LCSW was initially consulted to assist patient with community resources.  Interpreter: No.   Interpreter Name & Language: none  Assessment: Patient is experiencing financial difficulty related to low income. He is also in need of additional community resources.  Ongoing Intervention: Patient called CSW and requested assistance with transportation to cancer center appointment 11/11/22. CSW scheduled this transportation with [employer]. Patient also requesting assistance with electric bill. Patient to email this to CSW and will plan to request assistance from [facility].  Abigail Butts, LCSW Patient Care Center [facility] [phone]

## 2022-10-09 DIAGNOSIS — I63311 Cerebral infarction due to thrombosis of right middle cerebral artery: Secondary | ICD-10-CM | POA: Diagnosis not present

## 2022-10-10 DIAGNOSIS — I63311 Cerebral infarction due to thrombosis of right middle cerebral artery: Secondary | ICD-10-CM | POA: Diagnosis not present

## 2022-10-11 DIAGNOSIS — I63311 Cerebral infarction due to thrombosis of right middle cerebral artery: Secondary | ICD-10-CM | POA: Diagnosis not present

## 2022-10-12 DIAGNOSIS — I63311 Cerebral infarction due to thrombosis of right middle cerebral artery: Secondary | ICD-10-CM | POA: Diagnosis not present

## 2022-10-13 ENCOUNTER — Other Ambulatory Visit: Payer: Self-pay

## 2022-10-13 DIAGNOSIS — I63311 Cerebral infarction due to thrombosis of right middle cerebral artery: Secondary | ICD-10-CM | POA: Diagnosis not present

## 2022-10-14 DIAGNOSIS — I63311 Cerebral infarction due to thrombosis of right middle cerebral artery: Secondary | ICD-10-CM | POA: Diagnosis not present

## 2022-10-15 DIAGNOSIS — I63311 Cerebral infarction due to thrombosis of right middle cerebral artery: Secondary | ICD-10-CM | POA: Diagnosis not present

## 2022-10-16 DIAGNOSIS — I63311 Cerebral infarction due to thrombosis of right middle cerebral artery: Secondary | ICD-10-CM | POA: Diagnosis not present

## 2022-10-18 DIAGNOSIS — I63311 Cerebral infarction due to thrombosis of right middle cerebral artery: Secondary | ICD-10-CM | POA: Diagnosis not present

## 2022-10-19 DIAGNOSIS — I63311 Cerebral infarction due to thrombosis of right middle cerebral artery: Secondary | ICD-10-CM | POA: Diagnosis not present

## 2022-10-20 DIAGNOSIS — I63311 Cerebral infarction due to thrombosis of right middle cerebral artery: Secondary | ICD-10-CM | POA: Diagnosis not present

## 2022-10-21 DIAGNOSIS — I63311 Cerebral infarction due to thrombosis of right middle cerebral artery: Secondary | ICD-10-CM | POA: Diagnosis not present

## 2022-10-21 DIAGNOSIS — E119 Type 2 diabetes mellitus without complications: Secondary | ICD-10-CM | POA: Diagnosis not present

## 2022-10-22 DIAGNOSIS — I63311 Cerebral infarction due to thrombosis of right middle cerebral artery: Secondary | ICD-10-CM | POA: Diagnosis not present

## 2022-10-23 DIAGNOSIS — I63311 Cerebral infarction due to thrombosis of right middle cerebral artery: Secondary | ICD-10-CM | POA: Diagnosis not present

## 2022-10-24 DIAGNOSIS — I63311 Cerebral infarction due to thrombosis of right middle cerebral artery: Secondary | ICD-10-CM | POA: Diagnosis not present

## 2022-10-26 ENCOUNTER — Other Ambulatory Visit: Payer: Medicaid Other | Admitting: *Deleted

## 2022-10-26 DIAGNOSIS — I63311 Cerebral infarction due to thrombosis of right middle cerebral artery: Secondary | ICD-10-CM | POA: Diagnosis not present

## 2022-10-26 NOTE — Patient Instructions (Signed)
Visit Information  [name]  - as a part of your Medicaid benefit, you are eligible for care management and care coordination services at no cost or copay. I was unable to reach you by phone today but would be happy to help you with your health related needs. Please feel free to call me @ [phone].   A member of the Managed Medicaid care management team will reach out to you again over the next 7 days.   [name] RN, BSN [place]  Managed [facility] RN Care Coordinator [phone]

## 2022-10-26 NOTE — Patient Outreach (Signed)
  Medicaid Managed Care   Unsuccessful Attempt Note   10/26/2022 Name: Frank Schneider MRN: [id number] DOB: 09-19-1975  Referred by: [name], NP Reason for referral : High Risk Managed Medicaid (Unsuccessful RNCM follow up telephone outreach)   An unsuccessful telephone outreach was attempted today. The patient was referred to the case management team for assistance with care management and care coordination.    Follow Up Plan: A HIPAA compliant phone message was left for the patient providing contact information and requesting a return call. and The Managed Medicaid care management team will reach out to the patient again over the next 7 days.    [name] RN, BSN [place]  Managed [facility] RN Care Coordinator [phone]

## 2022-10-27 DIAGNOSIS — I63311 Cerebral infarction due to thrombosis of right middle cerebral artery: Secondary | ICD-10-CM | POA: Diagnosis not present

## 2022-10-28 DIAGNOSIS — I63311 Cerebral infarction due to thrombosis of right middle cerebral artery: Secondary | ICD-10-CM | POA: Diagnosis not present

## 2022-10-29 DIAGNOSIS — I63311 Cerebral infarction due to thrombosis of right middle cerebral artery: Secondary | ICD-10-CM | POA: Diagnosis not present

## 2022-10-30 DIAGNOSIS — I63311 Cerebral infarction due to thrombosis of right middle cerebral artery: Secondary | ICD-10-CM | POA: Diagnosis not present

## 2022-10-31 DIAGNOSIS — I63311 Cerebral infarction due to thrombosis of right middle cerebral artery: Secondary | ICD-10-CM | POA: Diagnosis not present

## 2022-11-01 DIAGNOSIS — I63311 Cerebral infarction due to thrombosis of right middle cerebral artery: Secondary | ICD-10-CM | POA: Diagnosis not present

## 2022-11-02 ENCOUNTER — Telehealth: Payer: Self-pay

## 2022-11-02 DIAGNOSIS — I63311 Cerebral infarction due to thrombosis of right middle cerebral artery: Secondary | ICD-10-CM | POA: Diagnosis not present

## 2022-11-02 NOTE — Telephone Encounter (Signed)
..   Medicaid Managed Care   Unsuccessful Outreach Note  11/02/2022 Name: Frank Schneider MRN: [id number] DOB: 1976/04/17  Referred by: [name], NP Reason for referral : Appointment   A second unsuccessful telephone outreach was attempted today. The patient was referred to the case management team for assistance with care management and care coordination.   Follow Up Plan: A HIPAA compliant phone message was left for the patient providing contact information and requesting a return call.  The care management team will reach out to the patient again over the next 7 days.   Weston Settle Care Guide  [facility] Managed  Care Guide [facility]  [phone]

## 2022-11-03 ENCOUNTER — Telehealth: Payer: Self-pay | Admitting: Clinical

## 2022-11-03 DIAGNOSIS — I63311 Cerebral infarction due to thrombosis of right middle cerebral artery: Secondary | ICD-10-CM | POA: Diagnosis not present

## 2022-11-03 NOTE — Telephone Encounter (Signed)
Integrated Behavioral Health Progress Note  11/03/2022 Name: Frank Schneider MRN: [id number] DOB: [birth year] Frank Schneider is a 47 y.o. year old male who sees [name], NP for primary care. LCSW was initially consulted to assist patient with community resources.  Interpreter: No.   Interpreter Name & Language: none  Assessment: Patient is experiencing financial difficulty related to low income. He is also in need of additional community resources.  Ongoing Intervention: Patient called and requested assistance in arranging transportation to his podiatry appointment at [facility] 11/06/22. CSW scheduled transportation via [employer].  Abigail Butts, LCSW Patient Care Center [facility] [phone]

## 2022-11-04 DIAGNOSIS — I63311 Cerebral infarction due to thrombosis of right middle cerebral artery: Secondary | ICD-10-CM | POA: Diagnosis not present

## 2022-11-05 DIAGNOSIS — I63311 Cerebral infarction due to thrombosis of right middle cerebral artery: Secondary | ICD-10-CM | POA: Diagnosis not present

## 2022-11-05 DIAGNOSIS — R3981 Functional urinary incontinence: Secondary | ICD-10-CM | POA: Diagnosis not present

## 2022-11-05 DIAGNOSIS — E119 Type 2 diabetes mellitus without complications: Secondary | ICD-10-CM | POA: Diagnosis not present

## 2022-11-06 ENCOUNTER — Telehealth: Payer: Self-pay

## 2022-11-06 ENCOUNTER — Other Ambulatory Visit: Payer: Medicaid Other | Admitting: *Deleted

## 2022-11-06 DIAGNOSIS — E1142 Type 2 diabetes mellitus with diabetic polyneuropathy: Secondary | ICD-10-CM | POA: Diagnosis not present

## 2022-11-06 DIAGNOSIS — M2041 Other hammer toe(s) (acquired), right foot: Secondary | ICD-10-CM | POA: Diagnosis not present

## 2022-11-06 DIAGNOSIS — I739 Peripheral vascular disease, unspecified: Secondary | ICD-10-CM | POA: Diagnosis not present

## 2022-11-06 DIAGNOSIS — I63311 Cerebral infarction due to thrombosis of right middle cerebral artery: Secondary | ICD-10-CM | POA: Diagnosis not present

## 2022-11-06 DIAGNOSIS — L84 Corns and callosities: Secondary | ICD-10-CM | POA: Diagnosis not present

## 2022-11-06 DIAGNOSIS — M792 Neuralgia and neuritis, unspecified: Secondary | ICD-10-CM | POA: Diagnosis not present

## 2022-11-06 DIAGNOSIS — M2042 Other hammer toe(s) (acquired), left foot: Secondary | ICD-10-CM | POA: Diagnosis not present

## 2022-11-06 DIAGNOSIS — B351 Tinea unguium: Secondary | ICD-10-CM | POA: Diagnosis not present

## 2022-11-06 NOTE — Telephone Encounter (Signed)
..   Medicaid Managed Care   Unsuccessful Outreach Note  11/06/2022 Name: Frank Schneider MRN: [id number] DOB: Aug 05, 1975  Referred by: [name], NP Reason for referral : Appointment   Third unsuccessful telephone outreach was attempted today. The patient was referred to the case management team for assistance with care management and care coordination. The patient's primary care provider has been notified of our unsuccessful attempts to make or maintain contact with the patient. The care management team is pleased to engage with this patient at any time in the future should he/she be interested in assistance from the care management team.   Follow Up Plan: We have been unable to make contact with the patient for follow up. The care management team is available to follow up with the patient after provider conversation with the patient regarding recommendation for care management engagement and subsequent re-referral to the care management team.   Weston Settle Care Guide  [facility] Managed  Care Guide [facility]  [phone]

## 2022-11-06 NOTE — Patient Outreach (Signed)
Care Management/Care Coordination  RN Case Manager Case Closure Note  11/06/2022 Name: Frank Schneider MRN: [id number] DOB: 01-10-1976  Frank Schneider is a 47 y.o. year old male who is a primary care patient of [name], NP. The care management/care coordination team was consulted for assistance with chronic disease management and/or care coordination needs.   Care Plan : RN Care Manager Plan of Care  Updates made by [name], RN since 11/06/2022 12:00 AM  Completed 11/06/2022   Problem: Health Management needs related to DM Resolved 11/06/2022     Long-Range Goal: Development of Plan of Care to address Health Management needs related to DM Completed 11/06/2022  Start Date: 07/23/2022  Expected End Date: 10/21/2022  Note:   Current Barriers:  Chronic Disease Management support and education needs related to DMII Unable to maintain contact with patient  RNCM Clinical Goal(s):  Patient will verbalize understanding of plan for management of DMII as evidenced by patient reports take all medications exactly as prescribed and will call provider for medication related questions as evidenced by patient reports and documentation in EMR    attend all scheduled medical appointments: 09/03/22 with Pharmacist and 09/23/22 with PCP as evidenced by provider documentation        through collaboration with RN Care manager, provider, and care team.   Interventions: Inter-disciplinary care team collaboration (see longitudinal plan of care) Evaluation of current treatment plan related to  self management and patient's adherence to plan as established by provider   Hypertension Interventions:  (Status:   Goal Not Met.) Long Term Goal Last practice recorded BP readings:  BP Readings from Last 3 Encounters:  09/03/22 (!) 140/79  06/23/22 127/80  06/22/22 126/81   Most recent eGFR/CrCl:  Lab Results  Component Value Date   EGFR 95 06/22/2022    No components found for:  "CRCL"  Evaluation of current treatment plan related to hypertension self management and patient's adherence to plan as established by provider Provided education to patient re: stroke prevention, s/s of heart attack and stroke Reviewed medications with patient and discussed importance of compliance Discussed plans with patient for ongoing care management follow up and provided patient with direct contact information for care management team Reviewed scheduled/upcoming provider appointments including:  Discussed complications of poorly controlled blood pressure such as heart disease, stroke, circulatory complications, vision complications, kidney impairment, sexual dysfunction Discussed the importance of exercise and diet in controlling HTN  Diabetes:  (Status: Goal Not Met.) Long Term Goal   Lab Results  Component Value Date   HGBA1C 10.0 (H) 06/22/2022   @ Assessed patient's understanding of A1c goal: <7% Provided education to patient about basic DM disease process; Reviewed medications with patient and discussed importance of medication adherence;        Reviewed prescribed diet with patient diabetic diet; Discussed plans with patient for ongoing care management follow up and provided patient with direct contact information for care management team;      Reviewed scheduled/upcoming provider appointments including:  PCP on 10/07/22;         Review of patient status, including review of consultants reports, relevant laboratory and other test results, and medications completed;       Assessed social determinant of health barriers;        Provided patient with [facility] medical transportation [phone] Discussed the benefits of exercise to manage BS Discussed member benefits offered by [facility] for GYM membership, provided with www.youronepass.com to get a code for free  GYM membership at participating locations Advised patient to take CGM and medications to upcoming appointments   Patient  Goals/Self-Care Activities: Take medications as prescribed   Attend all scheduled provider appointments Call pharmacy for medication refills 3-7 days in advance of running out of medications Call provider office for new concerns or questions  schedule appointment with eye doctor drink 6 to 8 glasses of water each day fill half of plate with vegetables manage portion size Exercise 30 minutes a day       Plan: We have been unable to make contact with the patient for follow up. The care management team is available to follow up with the patient should new care management/care coordination needs arise.   [name] RN, BSN [place]  Managed [facility] RN Care Coordinator [phone]

## 2022-11-07 DIAGNOSIS — I63311 Cerebral infarction due to thrombosis of right middle cerebral artery: Secondary | ICD-10-CM | POA: Diagnosis not present

## 2022-11-08 DIAGNOSIS — I63311 Cerebral infarction due to thrombosis of right middle cerebral artery: Secondary | ICD-10-CM | POA: Diagnosis not present

## 2022-11-09 DIAGNOSIS — I63311 Cerebral infarction due to thrombosis of right middle cerebral artery: Secondary | ICD-10-CM | POA: Diagnosis not present

## 2022-11-10 DIAGNOSIS — I63311 Cerebral infarction due to thrombosis of right middle cerebral artery: Secondary | ICD-10-CM | POA: Diagnosis not present

## 2022-11-11 ENCOUNTER — Other Ambulatory Visit: Payer: Self-pay

## 2022-11-11 ENCOUNTER — Inpatient Hospital Stay: Payer: Medicaid Other

## 2022-11-11 ENCOUNTER — Encounter: Payer: Self-pay | Admitting: Hematology

## 2022-11-11 ENCOUNTER — Inpatient Hospital Stay: Payer: Medicaid Other | Attending: Hematology | Admitting: Hematology

## 2022-11-11 VITALS — BP 142/79 | HR 65 | Temp 98.5°F | Resp 16 | Ht 70.0 in | Wt 179.6 lb

## 2022-11-11 DIAGNOSIS — D72829 Elevated white blood cell count, unspecified: Secondary | ICD-10-CM | POA: Insufficient documentation

## 2022-11-11 DIAGNOSIS — R718 Other abnormality of red blood cells: Secondary | ICD-10-CM | POA: Insufficient documentation

## 2022-11-11 DIAGNOSIS — F17210 Nicotine dependence, cigarettes, uncomplicated: Secondary | ICD-10-CM | POA: Diagnosis not present

## 2022-11-11 DIAGNOSIS — I63311 Cerebral infarction due to thrombosis of right middle cerebral artery: Secondary | ICD-10-CM | POA: Diagnosis not present

## 2022-11-11 LAB — CBC WITH DIFFERENTIAL (CANCER CENTER ONLY)
Abs Immature Granulocytes: 0.03 10*3/uL (ref 0.00–0.07)
Basophils Absolute: 0 10*3/uL (ref 0.0–0.1)
Basophils Relative: 0 %
Eosinophils Absolute: 0.2 10*3/uL (ref 0.0–0.5)
Eosinophils Relative: 2 %
HCT: 41.6 % (ref 39.0–52.0)
Hemoglobin: 13 g/dL (ref 13.0–17.0)
Immature Granulocytes: 0 %
Lymphocytes Relative: 22 %
Lymphs Abs: 2.3 10*3/uL (ref 0.7–4.0)
MCH: 19.5 pg — ABNORMAL LOW (ref 26.0–34.0)
MCHC: 31.3 g/dL (ref 30.0–36.0)
MCV: 62.5 fL — ABNORMAL LOW (ref 80.0–100.0)
Monocytes Absolute: 0.6 10*3/uL (ref 0.1–1.0)
Monocytes Relative: 5 %
Neutro Abs: 7.4 10*3/uL (ref 1.7–7.7)
Neutrophils Relative %: 71 %
Platelet Count: 203 10*3/uL (ref 150–400)
RBC: 6.66 MIL/uL — ABNORMAL HIGH (ref 4.22–5.81)
RDW: 18.3 % — ABNORMAL HIGH (ref 11.5–15.5)
WBC Count: 10.6 10*3/uL — ABNORMAL HIGH (ref 4.0–10.5)
nRBC: 0 % (ref 0.0–0.2)

## 2022-11-11 LAB — RETICULOCYTES
Immature Retic Fract: 29.9 % — ABNORMAL HIGH (ref 2.3–15.9)
RBC.: 6.55 MIL/uL — ABNORMAL HIGH (ref 4.22–5.81)
Retic Count, Absolute: 140.8 10*3/uL (ref 19.0–186.0)
Retic Ct Pct: 2.2 % (ref 0.4–3.1)

## 2022-11-11 LAB — TECHNOLOGIST SMEAR REVIEW

## 2022-11-11 LAB — IRON AND IRON BINDING CAPACITY (CC-WL,HP ONLY)
Iron: 58 ug/dL (ref 45–182)
Saturation Ratios: 22 % (ref 17.9–39.5)
TIBC: 262 ug/dL (ref 250–450)
UIBC: 204 ug/dL (ref 117–376)

## 2022-11-11 NOTE — Progress Notes (Signed)
[facility]   Telephone:([phone] Fax:([phone]) [phone]   Clinic New Consult Note   Patient Care Team: [name], NP as PCP - General (Pulmonary Disease) Alden Hipp, RPH-CPP (Pharmacist)  Date of Service:  11/11/2022  CHIEF COMPLAINTS/PURPOSE OF CONSULTATION:  Leukocytosis  REFERRING PHYSICIAN:  [name] , NP  HISTORY OF PRESENTING ILLNESS:  Frank Schneider 47 y.o. male is a here because of Leukocytosis. The patient was referred by Angus Seller, Kathie Rhodes , NP. The patient presents to the clinic today alone.  He has had intermittent leukocytosis since 2013 (first lab in our system), with total white blood cell count 10-15K, with predominant neutrophilia.  He is a smoker, has been smoking 1 pack a day for the past 3 years.  He denies significant allergy, no frequent infections.  He feels well overall, denies any other symptoms.  He has a PMHx of.... Mother-throat cancer   Socially... Single Smoker Drinker Disable No children   REVIEW OF SYSTEMS:   Constitutional:(-) Denies fevers, chills or abnormal night sweats Eyes:(-) Denies blurriness of vision, double vision or watery eyes Ears, nose, mouth, throat, and face: Denies mucositis or sore throat Respiratory:(-) Denies cough, dyspnea or wheezes Cardiovascular: (-)Denies palpitation, chest discomfort or lower extremity swelling Gastrointestinal: (-) Denies nausea, heartburn or change in bowel habits Skin:(-) Denies abnormal skin rashes Lymphatics: Denies new lymphadenopathy or easy bruising Neurological:(-)Denies numbness, tingling or new weaknesses Behavioral/Psych: (-)Mood is stable, no new changes  All other systems were reviewed with the patient and are negative.   MEDICAL HISTORY:  Past Medical History:  Diagnosis Date   Blind right eye    Diabetes mellitus    Takes Metformin   History of appendicitis 11/2015   Hypertension    Left flank pain 12/2019   Priapism    Stroke  (HCC) 04/2015   Tobacco use    Vision abnormalities    Vitamin D deficiency 12/2019    SURGICAL HISTORY: Past Surgical History:  Procedure Laterality Date   APPENDECTOMY     EVISCERATION Right 06/11/2022   Procedure: EVISCERATION REPAIR WITH POSSIBLE INCLUSION;  Surgeon: Dairl Ponder, MD;  Location: [facility] OR;  Service: Ophthalmology;  Laterality: Right;   EYE SURGERY     cataract removed 05/27/2016 right eye    INCISION AND DRAINAGE PERIRECTAL ABSCESS  07/28/2011   Procedure: IRRIGATION AND DEBRIDEMENT PERIRECTAL ABSCESS;  Surgeon: Ernestene Mention, MD;  Location: [facility] ORS;  Service: General;  Laterality: N/A;   of skin muscle and subcutaneous tissue of perimeum  8cmx12cm area    IR GENERIC HISTORICAL  01/08/2016   IR RADIOLOGIST EVAL & MGMT 01/08/2016 [facility] INTERV RAD   LAPAROSCOPIC APPENDECTOMY N/A 12/16/2015   Procedure: APPENDECTOMY LAPAROSCOPIC;  Surgeon: Abigail Miyamoto, MD;  Location: [facility] ORS;  Service: General;  Laterality: N/A;   PENECTOMY      SOCIAL HISTORY: Social History   Socioeconomic History   Marital status: Single    Spouse name: Not on file   Number of children: 0   Years of education: Not on file   Highest education level: Not on file  Occupational History   Not on file  Tobacco Use   Smoking status: Some Days    Current packs/day: 1.00    Average packs/day: 1 pack/day for 8.0 years (8.0 ttl pk-yrs)    Types: Cigarettes   Smokeless tobacco: Never   Tobacco comments:    1 pack per day.  Vaping Use   Vaping status: Never Used  Substance and Sexual Activity   Alcohol use: Yes    Comment: occ   Drug use: Yes    Types: Marijuana    Comment: occasional   Sexual activity: Yes  Other Topics Concern   Not on file  Social History Narrative   ** Merged History Encounter **       Social Determinants of Health   Financial Resource Strain: Not on file  Food Insecurity: Food Insecurity Present (11/11/2022)   Hunger Vital Sign    Worried About Running Out of  Food in the Last Year: Sometimes true    Ran Out of Food in the Last Year: Sometimes true  Transportation Needs: No Transportation Needs (11/11/2022)   PRAPARE - Administrator, Civil Service (Medical): No    Lack of Transportation (Non-Medical): No  Recent Concern: Transportation Needs - Unmet Transportation Needs (09/25/2022)   PRAPARE - Administrator, Civil Service (Medical): Yes    Lack of Transportation (Non-Medical): Yes  Physical Activity: Not on file  Stress: Not on file  Social Connections: Not on file  Intimate Partner Violence: Not At Risk (11/11/2022)   Humiliation, Afraid, Rape, and Kick questionnaire    Fear of Current or Ex-Partner: No    Emotionally Abused: No    Physically Abused: No    Sexually Abused: No    FAMILY HISTORY: Family History  Problem Relation Age of Onset   Cancer Mother        throat cancer   Diabetes Mother    Hypertension Mother    Diabetes Maternal Grandmother    Hypertension Maternal Grandmother    Depression Maternal Grandmother     ALLERGIES:  has No Known Allergies.  MEDICATIONS:  Current Outpatient Medications  Medication Sig Dispense Refill   acetaminophen (TYLENOL) 500 MG tablet Take 2 tablets (Total= 1,000 mg), by mouth, 2 times a day as needed. 120 tablet 3   amLODipine (NORVASC) 10 MG tablet Take 1 tablet (10 mg total) by mouth daily. 90 tablet 1   aspirin 325 MG tablet Take 1 tablet (325 mg total) by mouth daily. 90 tablet 1   atorvastatin (LIPITOR) 40 MG tablet Take 1 tablet (40 mg total) by mouth daily. 90 tablet 1   Blood Glucose Monitoring Suppl (TRUE METRIX AIR GLUCOSE METER) DEVI 1 Units by Does not apply route 2 (two) times daily. 1 each 0   cilostazol (PLETAL) 100 MG tablet Take 1 tablet (100 mg total) by mouth 2 (two) times daily before a meal. 60 tablet 11   Continuous Glucose Sensor (FREESTYLE LIBRE 3 SENSOR) MISC Place 1 sensor on the skin every 14 days. Use to check glucose continuously 6 each  1   empagliflozin (JARDIANCE) 25 MG TABS tablet Take 1 tablet (25 mg total) by mouth daily before breakfast. 90 tablet 2   glucose blood test strip Use as instructed 100 each 12   ibuprofen (ADVIL) 800 MG tablet Take 800 mg by mouth every 8 (eight) hours as needed.     insulin glargine (LANTUS SOLOSTAR) 100 UNIT/ML Solostar Pen Inject 16 Units into the skin at bedtime. 15 mL 2   Insulin Pen Needle (PEN NEEDLES) 32G X 4 MM MISC Use to inject insulin daily as directed. 100 each 2   lisinopril (ZESTRIL) 20 MG tablet Take 1 tablet (20 mg total) by mouth daily. 90 tablet 1   No current facility-administered medications for this visit.    PHYSICAL EXAMINATION: ECOG PERFORMANCE STATUS: 1 -  Symptomatic but completely ambulatory  Vitals:   11/11/22 1516  BP: (!) 142/79  Pulse: 65  Resp: 16  Temp: 98.5 F (36.9 C)  SpO2: 100%   Filed Weights   11/11/22 1516  Weight: 179 lb 9.6 oz (81.5 kg)     GENERAL:alert, no distress and comfortable SKIN: skin color normal, no rashes or significant lesions EYES: normal, Conjunctiva are pink and non-injected, sclera clear  NEURO: alert & oriented x 3 with fluent speech NECK: (-)supple, thyroid normal size, non-tender, without nodularity LYMPH:(-) no palpable lymphadenopathy in the cervical, axillary  LUNGS: clear to auscultation and percussion with normal breathing effort HEART: regular rate & rhythm and no murmurs and no lower extremity edema ABDOMEN:(-)abdomen soft, (-)non-tender and normal bowel sounds   LABORATORY DATA:  I have reviewed the data as listed    Latest Ref Rng & Units 11/11/2022    3:36 PM 10/07/2022    3:08 PM 06/22/2022    2:40 PM  CBC  WBC 4.0 - 10.5 K/uL 10.6  13.9  11.4   Hemoglobin 13.0 - 17.0 g/dL 78.2  95.6  21.3   Hematocrit 39.0 - 52.0 % 41.6  43.5  40.5   Platelets 150 - 400 K/uL 203  221  262        Latest Ref Rng & Units 10/07/2022    3:08 PM 06/22/2022    2:40 PM 06/11/2022   11:27 AM  CMP  Glucose 70 - 99  mg/dL 086  578  469   BUN 6 - 24 mg/dL 11  8  7    Creatinine 0.76 - 1.27 mg/dL 6.29  5.28  4.13   Sodium 134 - 144 mmol/L 143  141  139   Potassium 3.5 - 5.2 mmol/L 4.2  4.0  3.9   Chloride 96 - 106 mmol/L 103  101  98   CO2 20 - 29 mmol/L 26  25  28    Calcium 8.7 - 10.2 mg/dL 8.8  9.4  9.1   Total Protein 6.0 - 8.5 g/dL 6.7  6.7    Total Bilirubin 0.0 - 1.2 mg/dL 0.4  0.4    Alkaline Phos 44 - 121 IU/L 70  108    AST 0 - 40 IU/L 20  11    ALT 0 - 44 IU/L 19  15       RADIOGRAPHIC STUDIES: I have personally reviewed the radiological images as listed and agreed with the findings in the report. No results found.  ASSESSMENT & PLAN:  Frank Schneider is a 47 y.o.  male with a history of    1.  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 -This is likely reactive to his smoking, I have low suspicion for primary bone marrow disease such as CML.  But I will be happy to rule out CML, will obtain BCR/ABLPCR test today   2.  Microcytosis -He has low MCV, but normal hemoglobin -Will check ferritin and hemoglobin electrophoresis   PLAN:  -reviewing previous labs -Lab work today, will call him in 2 to 3 weeks to review results   Orders Placed This Encounter  Procedures   CBC with Differential (Cancer Center Only)    Standing Status:   Future    Number of Occurrences:   1    Standing Expiration Date:   11/11/2023  Technologist smear review    Order Specific Question:   Clinical information:    Answer:   leukocytosis   Reticulocytes    Standing Status:   Future    Number of Occurrences:   1    Standing Expiration Date:   11/11/2023   BCR ABL1 FISH ([facility])    Standing Status:   Future    Number of Occurrences:   1    Standing Expiration Date:   11/11/2023   Hgb Fractionation Cascade    Standing Status:   Future     Number of Occurrences:   1    Standing Expiration Date:   11/11/2023   Ferritin    Standing Status:   Future    Number of Occurrences:   1    Standing Expiration Date:   11/11/2023    All questions were answered. The patient knows to call the clinic with any problems, questions or concerns. The total time spent in the appointment was 30 minutes.     Malachy Mood, MD 11/11/2022  Carolin Coy, am acting as scribe for Malachy Mood, MD.   I have reviewed the above documentation for accuracy and completeness, and I agree with the above.

## 2022-11-12 ENCOUNTER — Encounter: Payer: Self-pay | Admitting: Hematology

## 2022-11-12 ENCOUNTER — Telehealth: Payer: Self-pay | Admitting: Hematology

## 2022-11-12 DIAGNOSIS — I63311 Cerebral infarction due to thrombosis of right middle cerebral artery: Secondary | ICD-10-CM | POA: Diagnosis not present

## 2022-11-12 LAB — FERRITIN: Ferritin: 181 ng/mL (ref 24–336)

## 2022-11-13 LAB — HGB FRACTIONATION CASCADE
Hgb A2: 6.1 % — ABNORMAL HIGH (ref 1.8–3.2)
Hgb A: 93.3 % — ABNORMAL LOW (ref 96.4–98.8)
Hgb F: 0.6 % (ref 0.0–2.0)
Hgb S: 0 %

## 2022-11-15 DIAGNOSIS — I63311 Cerebral infarction due to thrombosis of right middle cerebral artery: Secondary | ICD-10-CM | POA: Diagnosis not present

## 2022-11-16 ENCOUNTER — Other Ambulatory Visit: Payer: Self-pay | Admitting: Nurse Practitioner

## 2022-11-16 DIAGNOSIS — I63311 Cerebral infarction due to thrombosis of right middle cerebral artery: Secondary | ICD-10-CM | POA: Diagnosis not present

## 2022-11-16 DIAGNOSIS — I10 Essential (primary) hypertension: Secondary | ICD-10-CM

## 2022-11-16 MED ORDER — EMPAGLIFLOZIN 25 MG PO TABS: 25.0000 mg | ORAL_TABLET | Freq: Every day | ORAL | 2 refills | Status: DC

## 2022-11-16 MED ORDER — IBUPROFEN 800 MG PO TABS: 800.0000 mg | ORAL_TABLET | Freq: Three times a day (TID) | ORAL | 2 refills | Status: DC | PRN

## 2022-11-16 MED ORDER — LISINOPRIL 20 MG PO TABS
20.0000 mg | ORAL_TABLET | Freq: Every day | ORAL | 1 refills | Status: DC
Start: 2022-11-16 — End: 2022-11-19

## 2022-11-16 MED ORDER — IBUPROFEN 800 MG PO TABS
800.0000 mg | ORAL_TABLET | Freq: Three times a day (TID) | ORAL | 2 refills | Status: DC | PRN
  Filled 2022-11-16: qty 30, 10d supply, fill #0

## 2022-11-16 MED ORDER — EMPAGLIFLOZIN 25 MG PO TABS
25.0000 mg | ORAL_TABLET | Freq: Every day | ORAL | 2 refills | Status: DC
  Filled 2022-11-16: qty 90, 90d supply, fill #0

## 2022-11-16 MED ORDER — LISINOPRIL 20 MG PO TABS
20.0000 mg | ORAL_TABLET | Freq: Every day | ORAL | 1 refills | Status: DC
Start: 2022-11-16 — End: 2022-11-16
  Filled 2022-11-16: qty 90, 90d supply, fill #0

## 2022-11-16 NOTE — Telephone Encounter (Signed)
Patient requesting refills that need to be sent to his new pharmacy: [facility] ([address] [place], [place]; [phone]). Needs ibuprofen, lisinopril, and Jardiance.

## 2022-11-17 ENCOUNTER — Other Ambulatory Visit: Payer: Self-pay | Admitting: Nurse Practitioner

## 2022-11-17 ENCOUNTER — Other Ambulatory Visit: Payer: Self-pay

## 2022-11-17 DIAGNOSIS — I63311 Cerebral infarction due to thrombosis of right middle cerebral artery: Secondary | ICD-10-CM | POA: Diagnosis not present

## 2022-11-17 DIAGNOSIS — E1165 Type 2 diabetes mellitus with hyperglycemia: Secondary | ICD-10-CM

## 2022-11-17 LAB — COLOGUARD

## 2022-11-17 LAB — BCR ABL1 FISH (GENPATH)

## 2022-11-18 DIAGNOSIS — I63311 Cerebral infarction due to thrombosis of right middle cerebral artery: Secondary | ICD-10-CM | POA: Diagnosis not present

## 2022-11-19 ENCOUNTER — Other Ambulatory Visit: Payer: Self-pay

## 2022-11-19 ENCOUNTER — Other Ambulatory Visit: Payer: Medicaid Other | Admitting: Pharmacist

## 2022-11-19 ENCOUNTER — Other Ambulatory Visit (HOSPITAL_COMMUNITY): Payer: Self-pay

## 2022-11-19 DIAGNOSIS — I63311 Cerebral infarction due to thrombosis of right middle cerebral artery: Secondary | ICD-10-CM | POA: Diagnosis not present

## 2022-11-19 DIAGNOSIS — I10 Essential (primary) hypertension: Secondary | ICD-10-CM

## 2022-11-19 MED ORDER — OZEMPIC (0.25 OR 0.5 MG/DOSE) 2 MG/3ML ~~LOC~~ SOPN
PEN_INJECTOR | SUBCUTANEOUS | 2 refills | Status: DC
  Filled 2022-11-19: qty 3, 42d supply, fill #0
  Filled 2022-12-28: qty 3, 28d supply, fill #1

## 2022-11-19 MED ORDER — LISINOPRIL 20 MG PO TABS
20.0000 mg | ORAL_TABLET | Freq: Every day | ORAL | 1 refills | Status: DC
Start: 2022-11-19 — End: 2023-05-10
  Filled 2022-11-19 – 2022-11-20 (×3): qty 90, 90d supply, fill #0
  Filled 2022-11-20: qty 30, 30d supply, fill #0
  Filled 2022-12-17: qty 30, 30d supply, fill #1
  Filled 2023-01-21: qty 30, 30d supply, fill #2
  Filled 2023-02-15: qty 30, 30d supply, fill #3
  Filled 2023-03-24: qty 30, 30d supply, fill #4
  Filled 2023-04-14 – 2023-04-19 (×2): qty 30, 30d supply, fill #5

## 2022-11-19 MED ORDER — IBUPROFEN 800 MG PO TABS
800.0000 mg | ORAL_TABLET | Freq: Three times a day (TID) | ORAL | 2 refills | Status: DC | PRN
  Filled 2022-11-19 – 2022-11-20 (×2): qty 30, 10d supply, fill #0

## 2022-11-19 MED ORDER — EMPAGLIFLOZIN 25 MG PO TABS
25.0000 mg | ORAL_TABLET | Freq: Every day | ORAL | 2 refills | Status: DC
  Filled 2022-11-19 – 2022-11-20 (×3): qty 90, 90d supply, fill #0
  Filled 2022-11-20: qty 30, 30d supply, fill #0
  Filled 2022-12-17: qty 30, 30d supply, fill #1
  Filled 2023-01-21: qty 30, 30d supply, fill #2
  Filled 2023-02-15: qty 30, 30d supply, fill #3
  Filled 2023-03-24: qty 30, 30d supply, fill #4
  Filled 2023-04-14 – 2023-04-19 (×3): qty 30, 30d supply, fill #5
  Filled 2023-05-10 – 2023-05-18 (×3): qty 30, 30d supply, fill #6
  Filled 2023-06-04 – 2023-06-10 (×2): qty 30, 30d supply, fill #7
  Filled 2023-06-28 – 2023-07-06 (×2): qty 30, 30d supply, fill #8

## 2022-11-19 NOTE — Patient Instructions (Signed)
Avontae,   It was great talking to you today!  Stop Lantus. Start Ozempic 0.25 mg weekly for 4 weeks, then increase to 0.5 mg weekly. This medication may cause stomach upset, queasiness, or constipation, especially when first starting. This generally improves over time. Call our office if these symptoms occur and worsen, or if you have severe symptoms such as vomiting, diarrhea, or stomach pain.   Call me with any questions or concerns!  Catie Eppie Gibson, PharmD, BCACP, CPP Clinical Pharmacist [facility] [phone]

## 2022-11-19 NOTE — Progress Notes (Signed)
11/19/2022 Name: Frank Schneider MRN: [id number] DOB: 08-17-1975  Chief Complaint  Patient presents with   Medication Management   Diabetes   Hypertension   Hyperlipidemia    Frank Schneider is a 47 y.o. year old male who presented for a telephone visit.   They were referred to the pharmacist by their PCP for assistance in managing diabetes and hypertension.    Subjective:  Care Team: Primary Care Provider: [name], NP ; Next Scheduled Visit:   Medication Access/Adherence  Current Pharmacy:  [facility] [address] [place] [address] Phone: [phone] Fax: [phone]  [facility] - [place], [place] - [address] [place] [address] Phone: [phone] Fax: [phone]   Patient reports affordability concerns with their medications: No  Patient reports access/transportation concerns to their pharmacy: No  Patient reports adherence concerns with their medications:  No    Reports he actually does want his medications packaged from [facility]. Requests refills on Jardiance and lisinopril    Diabetes:  Current medications: Jardiance 25 mg daily, Lantus 12 units daily,  Prior medications: did not tolerate metformin XR - GI upset  Date of Download: 7/12-7/25/24 % Time CGM is active: 98% Average Glucose: 173 mg/dL Glucose Management Indicator: 7.4  Glucose Variability: 30.1 (goal <36%) Time in Goal:  - Time in range 70-180: 56% - Time above range: 44% - Time below range: 0%  Patient denies hypoglycemic s/sx including dizziness, shakiness, sweating. Patient denies hyperglycemic symptoms including polyuria, polydipsia, polyphagia, nocturia, neuropathy, blurred vision.  Hypertension:  Current medications: amlodipine 10 mg daily, lisinopril 20 mg daily  Patient has a validated, automated, upper arm home BP cuff Current blood pressure readings readings: reports  readings "normal" at home, and he identifies that normal is ~ 130/80  Notes that he forgot to take his medications prior to going to Dr. Latanya Maudlin appointment  Patient denies hypotensive s/sx including dizziness, lightheadedness.  Patient denies hypertensive symptoms including headache, chest pain, shortness of breath    Hyperlipidemia/ASCVD Risk Reduction  Current lipid lowering medications: atorvastatin 40 mg daily  Antiplatelet regimen: aspirin 325 mg daily  Additionally on cilostazol 100 mg twice daily for PAD   Objective:  Lab Results  Component Value Date   HGBA1C 7.5 (A) 10/07/2022    Lab Results  Component Value Date   CREATININE 1.12 10/07/2022   BUN 11 10/07/2022   NA 143 10/07/2022   K 4.2 10/07/2022   CL 103 10/07/2022   CO2 26 10/07/2022    Lab Results  Component Value Date   CHOL 88 (L) 06/22/2022   HDL 30 (L) 06/22/2022   LDLCALC 43 06/22/2022   TRIG 67 06/22/2022   CHOLHDL 2.9 06/22/2022    Medications Reviewed Today     Reviewed by Alden Hipp, RPH-CPP (Pharmacist) on 11/19/22 at 1346  Med List Status: <None>   Medication Order Taking? Sig Documenting Provider Last Dose Status Informant  acetaminophen (TYLENOL) 500 MG tablet [id number]  Take 2 tablets (Total= 1,000 mg), by mouth, 2 times a day as needed. [name], MD  Active Self  amLODipine (NORVASC) 10 MG tablet [id number]  Take 1 tablet (10 mg total) by mouth daily. [name], NP  Active   aspirin 325 MG tablet [id number]  Take 1 tablet (325 mg total) by mouth daily. [name], NP  Active   atorvastatin (LIPITOR) 40 MG tablet [id number]  Take 1 tablet (  40 mg total) by mouth daily. [name], NP  Active    Patient not taking:   Discontinued 06/22/22 1418 Blood Glucose Monitoring Suppl (TRUE METRIX AIR GLUCOSE METER) DEVI [id number]  1 Units by Does not apply route 2 (two) times daily. [name], NP  Active   cilostazol (PLETAL) 100 MG tablet [id number]   Take 1 tablet (100 mg total) by mouth 2 (two) times daily before a meal. [name], MD  Active   Continuous Glucose Sensor (FREESTYLE LIBRE 3 [place]) [place] [id number]  Place 1 sensor on the skin every 14 days. Use to check glucose continuously [name], NP  Active   empagliflozin (JARDIANCE) 25 MG TABS tablet [id number]  Take 1 tablet (25 mg total) by mouth daily before breakfast. [name], NP  Active     Discontinued 06/22/22 1418 glucose blood test strip [id number]  Use as instructed [name], NP  Active   ibuprofen (ADVIL) 800 MG tablet [id number]  Take 1 tablet (800 mg total) by mouth every 8 (eight) hours as needed. [name], NP  Active   insulin glargine (LANTUS SOLOSTAR) 100 UNIT/ML Solostar Pen [id number] Yes Inject 16 Units into the skin at bedtime. [name], NP Taking Active            Med Note Clearance Coots, [place] Pitt[place] T   Thu Nov 19, 2022  1:31 PM) 12 units  Insulin Pen Needle (PEN NEEDLES) 32G X 4 MM MISC [id number]  Use to inject insulin daily as directed. [name], NP  Active    Patient not taking:   Discontinued 06/22/22 1418 lisinopril (ZESTRIL) 20 MG tablet [id number]  Take 1 tablet (20 mg total) by mouth daily. [name], NP  Active               Assessment/Plan:   Diabetes: - Currently uncontrolled - Reviewed long term cardiovascular and renal outcomes of uncontrolled blood sugar - Reviewed goal A1c, goal fasting, and goal 2 hour post prandial glucose - Recommend to start Ozempic. Discussed mechanism of action, side effects. Patient is amenable. Discussed with PCP. Stop Lantus. Start Ozempic 0.25 mg weekly for 4 weeks, then increase to 0.5 mg weekly.  - Recommend to check glucose using CGM  Hypertension: - Currently controlled per reported home readings.  - Reviewed long term cardiovascular and renal outcomes of uncontrolled blood pressure - Reviewed appropriate blood pressure monitoring technique and reviewed goal  blood pressure. Recommended to check home blood pressure and heart rate periodically - Reminded to take medications, especially blood pressure medications, before coming to office appointments  Hyperlipidemia/ASCVD Risk Reduction: - Currently controlled.  - Recommend to continue current regimen at this time  Follow Up Plan: phone call in 4 weeks  Catie TClearance Coots, PharmD, BCACP, CPP Clinical Pharmacist [facility] [phone]

## 2022-11-20 ENCOUNTER — Other Ambulatory Visit (HOSPITAL_COMMUNITY): Payer: Self-pay

## 2022-11-20 ENCOUNTER — Other Ambulatory Visit: Payer: Self-pay

## 2022-11-20 DIAGNOSIS — E119 Type 2 diabetes mellitus without complications: Secondary | ICD-10-CM | POA: Diagnosis not present

## 2022-11-20 DIAGNOSIS — I63311 Cerebral infarction due to thrombosis of right middle cerebral artery: Secondary | ICD-10-CM | POA: Diagnosis not present

## 2022-11-21 DIAGNOSIS — I63311 Cerebral infarction due to thrombosis of right middle cerebral artery: Secondary | ICD-10-CM | POA: Diagnosis not present

## 2022-11-22 DIAGNOSIS — I63311 Cerebral infarction due to thrombosis of right middle cerebral artery: Secondary | ICD-10-CM | POA: Diagnosis not present

## 2022-11-23 ENCOUNTER — Encounter: Payer: Self-pay | Admitting: Pharmacist

## 2022-11-23 ENCOUNTER — Other Ambulatory Visit: Payer: Self-pay

## 2022-11-23 DIAGNOSIS — I63311 Cerebral infarction due to thrombosis of right middle cerebral artery: Secondary | ICD-10-CM | POA: Diagnosis not present

## 2022-11-24 DIAGNOSIS — I63311 Cerebral infarction due to thrombosis of right middle cerebral artery: Secondary | ICD-10-CM | POA: Diagnosis not present

## 2022-11-25 DIAGNOSIS — I63311 Cerebral infarction due to thrombosis of right middle cerebral artery: Secondary | ICD-10-CM | POA: Diagnosis not present

## 2022-11-26 ENCOUNTER — Other Ambulatory Visit: Payer: Self-pay

## 2022-11-26 DIAGNOSIS — I63311 Cerebral infarction due to thrombosis of right middle cerebral artery: Secondary | ICD-10-CM | POA: Diagnosis not present

## 2022-11-27 DIAGNOSIS — I63311 Cerebral infarction due to thrombosis of right middle cerebral artery: Secondary | ICD-10-CM | POA: Diagnosis not present

## 2022-11-28 DIAGNOSIS — I63311 Cerebral infarction due to thrombosis of right middle cerebral artery: Secondary | ICD-10-CM | POA: Diagnosis not present

## 2022-11-29 DIAGNOSIS — I63311 Cerebral infarction due to thrombosis of right middle cerebral artery: Secondary | ICD-10-CM | POA: Diagnosis not present

## 2022-11-30 DIAGNOSIS — I63311 Cerebral infarction due to thrombosis of right middle cerebral artery: Secondary | ICD-10-CM | POA: Diagnosis not present

## 2022-12-01 ENCOUNTER — Inpatient Hospital Stay: Payer: Medicaid Other | Attending: Hematology | Admitting: Hematology

## 2022-12-01 DIAGNOSIS — I63311 Cerebral infarction due to thrombosis of right middle cerebral artery: Secondary | ICD-10-CM | POA: Diagnosis not present

## 2022-12-01 DIAGNOSIS — D72829 Elevated white blood cell count, unspecified: Secondary | ICD-10-CM

## 2022-12-01 NOTE — Assessment & Plan Note (Signed)
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 -This is likely reactive to his smoking, I have low suspicion for primary bone marrow disease such as CML.  His BCR/ABLPCR test came back negative, CML ruled out, I reviewed with him

## 2022-12-01 NOTE — Progress Notes (Deleted)
[name]   Telephone:(336) [phone] Fax:([phone]   Clinic Follow up Note   Patient Care Team: [name], NP as PCP - General (Pulmonary Disease) Alden Hipp, RPH-CPP (Pharmacist)  Date of Service:  12/01/2022  I connected with Frank Schneider on 12/01/2022 at  1:00 PM EDT by {Blank single:19197::"video enabled telemedicine visit","telephone visit"} and verified that I am speaking with the correct person using two identifiers.  I discussed the limitations, risks, security and privacy concerns of performing an evaluation and management service by telephone and the availability of in person appointments. I also discussed with the patient that there may be a patient responsible charge related to this service. The patient expressed understanding and agreed to proceed.   Other persons participating in the visit and their role in the encounter:  ***  Patient's location:  *** Provider's location:  ***  CHIEF COMPLAINT: f/u of Leukocytosis   CURRENT THERAPY:  ***  ASSESSMENT & PLAN: *** Zacharee Illes is a 47 y.o. 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 -This is likely reactive to his smoking, I have low suspicion for primary bone marrow disease such as CML.  His BCR/ABLPCR test came back negative, CML ruled out, I reviewed with him      SUMMARY OF ONCOLOGIC HISTORY: Oncology History   No history exists.     INTERVAL HISTORY:  [name] was contacted for a follow up of Leukocytosis . He was last seen by me on 11/11/2022.    All other systems were reviewed with the patient and are negative.  MEDICAL HISTORY:  Past Medical History:  Diagnosis Date   Blind right eye    Diabetes mellitus    Takes Metformin   History of appendicitis 11/2015   Hypertension    Left  flank pain 12/2019   Priapism    Stroke (HCC) 04/2015   Tobacco use    Vision abnormalities    Vitamin D deficiency 12/2019    SURGICAL HISTORY: Past Surgical History:  Procedure Laterality Date   APPENDECTOMY     EVISCERATION Right 06/11/2022   Procedure: EVISCERATION REPAIR WITH POSSIBLE INCLUSION;  Surgeon: Dairl Ponder, MD;  Location[facility] OR;  Service: Ophthalmology;  Laterality: Right;   EYE SURGERY     cataract removed 05/27/2016 right eye    INCISION AND DRAINAGE PERIRECTAL ABSCESS  07/28/2011   Procedure: IRRIGATION AND DEBRIDEMENT PERIRECTAL ABSCESS;  Surgeon: Ernestene Mention, MD;  Location: [facility] ORS;  Service: General;  Laterality: N/A;   of skin muscle and subcutaneous tissue of perimeum  8cmx12cm area    IR GENERIC HISTORICAL  01/08/2016   IR RADIOLOGIST EVAL & MGMT 01/08/2016 [facility] INTERV RAD   LAPAROSCOPIC APPENDECTOMY N/A 12/16/2015   Procedure: APPENDECTOMY LAPAROSCOPIC;  Surgeon: Abigail Miyamoto, MD;  Location: [facility] ORS;  Service: General;  Laterality: N/A;   PENECTOMY      I have reviewed the social history and family history with the patient and they are unchanged from previous note.  ALLERGIES:  has No Known Allergies.  MEDICATIONS:  Current Outpatient Medications  Medication Sig Dispense Refill   acetaminophen (TYLENOL) 500 MG tablet Take 2 tablets (Total= 1,000 mg), by mouth, 2 times a day as needed. 120 tablet 3   amLODipine (NORVASC) 10 MG tablet Take 1 tablet (  10 mg total) by mouth daily. 90 tablet 1   aspirin 325 MG tablet Take 1 tablet (325 mg total) by mouth daily. 90 tablet 1   atorvastatin (LIPITOR) 40 MG tablet Take 1 tablet (40 mg total) by mouth daily. 90 tablet 1   Blood Glucose Monitoring Suppl (TRUE METRIX AIR GLUCOSE METER) DEVI 1 Units by Does not apply route 2 (two) times daily. 1 each 0   cilostazol (PLETAL) 100 MG tablet Take 1 tablet (100 mg total) by mouth 2 (two) times daily before a meal. 60 tablet 11   Continuous Glucose Sensor  (FREESTYLE LIBRE 3 SENSOR) MISC Place 1 sensor on the skin every 14 days. Use to check glucose continuously 6 each 1   empagliflozin (JARDIANCE) 25 MG TABS tablet Take 1 tablet (25 mg total) by mouth daily before breakfast. 90 tablet 2   glucose blood test strip Use as instructed 100 each 12   ibuprofen (ADVIL) 800 MG tablet Take 1 tablet (800 mg total) by mouth every 8 (eight) hours as needed. 30 tablet 2   insulin glargine (LANTUS SOLOSTAR) 100 UNIT/ML Solostar Pen Inject 16 Units into the skin at bedtime. 15 mL 2   Insulin Pen Needle (PEN NEEDLES) 32G X 4 MM MISC Use to inject insulin daily as directed. 100 each 2   lisinopril (ZESTRIL) 20 MG tablet Take 1 tablet (20 mg total) by mouth daily. 90 tablet 1   Semaglutide,0.25 or 0.5MG /DOS, (OZEMPIC, 0.25 OR 0.5 MG/DOSE,) 2 MG/3ML SOPN Inject 0.25 mg weekly for 4 weeks, then increase to 0.5 mg weekly 3 mL 2   No current facility-administered medications for this visit.    PHYSICAL EXAMINATION: ECOG PERFORMANCE STATUS: {CHL ONC ECOG [phone]}  There were no vitals filed for this visit. Wt Readings from Last 3 Encounters:  11/11/22 179 lb 9.6 oz (81.5 kg)  10/07/22 183 lb (83 kg)  06/23/22 174 lb (78.9 kg)    *** No vitals taken today, Exam not performed today  LABORATORY DATA:  I have reviewed the data as listed    Latest Ref Rng & Units 11/11/2022    3:36 PM 10/07/2022    3:08 PM 06/22/2022    2:40 PM  CBC  WBC 4.0 - 10.5 K/uL 10.6  13.9  11.4   Hemoglobin 13.0 - 17.0 g/dL 21.3  08.6  57.8   Hematocrit 39.0 - 52.0 % 41.6  43.5  40.5   Platelets 150 - 400 K/uL 203  221  262         Latest Ref Rng & Units 10/07/2022    3:08 PM 06/22/2022    2:40 PM 06/11/2022   11:27 AM  CMP  Glucose 70 - 99 mg/dL 469  629  528   BUN 6 - 24 mg/dL 11  8  7    Creatinine 0.76 - 1.27 mg/dL 4.13  2.44  0.10   Sodium 134 - 144 mmol/L 143  141  139   Potassium 3.5 - 5.2 mmol/L 4.2  4.0  3.9   Chloride 96 - 106 mmol/L 103  101  98   CO2 20 - 29  mmol/L 26  25  28    Calcium 8.7 - 10.2 mg/dL 8.8  9.4  9.1   Total Protein 6.0 - 8.5 g/dL 6.7  6.7    Total Bilirubin 0.0 - 1.2 mg/dL 0.4  0.4    Alkaline Phos 44 - 121 IU/L 70  108    AST 0 - 40 IU/L 20  11    ALT 0 - 44 IU/L 19  15        RADIOGRAPHIC STUDIES: I have personally reviewed the radiological images as listed and agreed with the findings in the report. No results found.    No orders of the defined types were placed in this encounter.  All questions were answered. The patient knows to call the clinic with any problems, questions or concerns. No barriers to learning was detected. The total time spent in the appointment was {CHL ONC TIME VISIT - ZOXWR:[id number]}.     Salome Holmes, CMA 12/01/2022   Carolin Coy am acting as scribe for Malachy Mood, MD.   {Add scribe attestation statement}

## 2022-12-02 ENCOUNTER — Telehealth: Payer: Self-pay | Admitting: Clinical

## 2022-12-02 ENCOUNTER — Telehealth: Payer: Self-pay | Admitting: Hematology

## 2022-12-02 DIAGNOSIS — I63311 Cerebral infarction due to thrombosis of right middle cerebral artery: Secondary | ICD-10-CM | POA: Diagnosis not present

## 2022-12-02 NOTE — Telephone Encounter (Signed)
Caller & Relationship to patient:  MRN #  [id number]   Call Back Number: [phone]   Date of Last Office Visit: 11/17/2022     Date of Next Office Visit: 01/07/2023    Medication(s) to be Refilled: ibuprofen 800 - Looks like this was sent to the pharmacy recently but he says he didn't receive it with his other meds when they were delivered  Preferred Pharmacy: [facility] (Ph: [phone])   ** Please notify patient to allow 48-72 hours to process** **Let patient know to contact pharmacy at the end of the day to make sure medication is ready. ** **If patient has not been seen in a year or longer, book an appointment **Advise to use MyChart for refill requests OR to contact their pharmacy

## 2022-12-03 ENCOUNTER — Other Ambulatory Visit: Payer: Self-pay | Admitting: Nurse Practitioner

## 2022-12-03 ENCOUNTER — Other Ambulatory Visit: Payer: Self-pay

## 2022-12-03 ENCOUNTER — Inpatient Hospital Stay (HOSPITAL_BASED_OUTPATIENT_CLINIC_OR_DEPARTMENT_OTHER): Payer: Medicaid Other | Admitting: Hematology

## 2022-12-03 DIAGNOSIS — D72825 Bandemia: Secondary | ICD-10-CM | POA: Diagnosis not present

## 2022-12-03 DIAGNOSIS — I63311 Cerebral infarction due to thrombosis of right middle cerebral artery: Secondary | ICD-10-CM | POA: Diagnosis not present

## 2022-12-03 MED ORDER — IBUPROFEN 800 MG PO TABS
800.0000 mg | ORAL_TABLET | Freq: Three times a day (TID) | ORAL | 2 refills | Status: DC | PRN
  Filled 2022-12-03: qty 30, 10d supply, fill #0
  Filled 2023-01-29: qty 30, 10d supply, fill #1

## 2022-12-03 NOTE — Progress Notes (Signed)
[facility]   Telephone:(336) [phone] Fax:(336) [phone]   Clinic Follow up Note   Patient Care Team: [name], NP as PCP - General (Pulmonary Disease) Alden Hipp, RPH-CPP (Pharmacist)  Date of Service:  12/03/2022  CHIEF COMPLAINT: f/u of leukocytosis   I connected with Frank Schneider on 12/03/22 at  1:00 PM EDT by telephone and verified that I am speaking with the correct person using two identifiers.   I discussed the limitations, risks, security and privacy concerns of performing an evaluation and management service by telephone and the availability of in person appointments. I also discussed with the patient that there may be a patient responsible charge related to this service. The patient expressed understanding and agreed to proceed.   Patient's location:  Home  Provider's location:  Office    CURRENT THERAPY:  Observation   ASSESSMENT:  Frank Schneider is a 47 y.o. 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 -This is likely reactive to his smoking, I have low suspicion for primary bone marrow disease such as CML.  His BCR/ABLPCR test came back negative, CML ruled out, I reviewed with him   -I strongly encouraged him to quit smoking, He voiced good understanding.  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  Hemoglobin electrophoresis showed increased A2, consistent with beta thalassemia. -I encouraged him to stay hydrated, and to inform his siblings to be tested.  Patient does not have children.  PLAN:   INTERVAL HISTORY:  Frank Schneider is contacted for a virtual follow up of leukocytosis. He was last seen by me on November 11, 2022.  He verified his identity.  He is clinically doing well, denies any symptoms.  All other systems were reviewed with the patient and  are negative.  MEDICAL HISTORY:  Past Medical History:  Diagnosis Date   Blind right eye    Diabetes mellitus    Takes Metformin   History of appendicitis 11/2015   Hypertension    Left flank pain 12/2019   Priapism    Stroke (HCC) 04/2015   Tobacco use    Vision abnormalities    Vitamin D deficiency 12/2019    SURGICAL HISTORY: Past Surgical History:  Procedure Laterality Date   APPENDECTOMY     EVISCERATION Right 06/11/2022   Procedure: EVISCERATION REPAIR WITH POSSIBLE INCLUSION;  Surgeon: Dairl Ponder, MD;  Location: [facility] OR;  Service: Ophthalmology;  Laterality: Right;   EYE SURGERY     cataract removed 05/27/2016 right eye    INCISION AND DRAINAGE PERIRECTAL ABSCESS  07/28/2011   Procedure: IRRIGATION AND DEBRIDEMENT PERIRECTAL ABSCESS;  Surgeon: Ernestene Mention, MD;  Location: [facility];  Service: General;  Laterality: N/A;   of skin muscle and subcutaneous tissue of perimeum  8cmx12cm area    IR GENERIC HISTORICAL  01/08/2016   IR RADIOLOGIST EVAL & MGMT 01/08/2016 [facility] INTERV RAD   LAPAROSCOPIC APPENDECTOMY N/A 12/16/2015   Procedure: APPENDECTOMY LAPAROSCOPIC;  Surgeon: Abigail Miyamoto, MD;  Location: [facility];  Service: General;  Laterality: N/A;   PENECTOMY      I have reviewed the social history and family history with the patient and they are unchanged from previous note.  ALLERGIES:  has No Known Allergies.  MEDICATIONS:  Current Outpatient Medications  Medication Sig Dispense Refill   acetaminophen (TYLENOL)  500 MG tablet Take 2 tablets (Total= 1,000 mg), by mouth, 2 times a day as needed. 120 tablet 3   amLODipine (NORVASC) 10 MG tablet Take 1 tablet (10 mg total) by mouth daily. 90 tablet 1   aspirin 325 MG tablet Take 1 tablet (325 mg total) by mouth daily. 90 tablet 1   atorvastatin (LIPITOR) 40 MG tablet Take 1 tablet (40 mg total) by mouth daily. 90 tablet 1   Blood Glucose Monitoring Suppl (TRUE METRIX AIR GLUCOSE METER) DEVI 1 Units by Does not apply  route 2 (two) times daily. 1 each 0   cilostazol (PLETAL) 100 MG tablet Take 1 tablet (100 mg total) by mouth 2 (two) times daily before a meal. 60 tablet 11   Continuous Glucose Sensor (FREESTYLE LIBRE 3 SENSOR) MISC Place 1 sensor on the skin every 14 days. Use to check glucose continuously 6 each 1   empagliflozin (JARDIANCE) 25 MG TABS tablet Take 1 tablet (25 mg total) by mouth daily before breakfast. 90 tablet 2   glucose blood test strip Use as instructed 100 each 12   ibuprofen (ADVIL) 800 MG tablet Take 1 tablet (800 mg total) by mouth every 8 (eight) hours as needed. 30 tablet 2   insulin glargine (LANTUS SOLOSTAR) 100 UNIT/ML Solostar Pen Inject 16 Units into the skin at bedtime. 15 mL 2   Insulin Pen Needle (PEN NEEDLES) 32G X 4 MM MISC Use to inject insulin daily as directed. 100 each 2   lisinopril (ZESTRIL) 20 MG tablet Take 1 tablet (20 mg total) by mouth daily. 90 tablet 1   Semaglutide,0.25 or 0.5MG /DOS, (OZEMPIC, 0.25 OR 0.5 MG/DOSE,) 2 MG/3ML SOPN Inject 0.25 mg weekly for 4 weeks, then increase to 0.5 mg weekly 3 mL 2   No current facility-administered medications for this visit.    PHYSICAL EXAMINATION: ECOG PERFORMANCE STATUS: 0 - Asymptomatic  No exam   LABORATORY DATA:  I have reviewed the data as listed    Latest Ref Rng & Units 11/11/2022    3:36 PM 10/07/2022    3:08 PM 06/22/2022    2:40 PM  CBC  WBC 4.0 - 10.5 K/uL 10.6  13.9  11.4   Hemoglobin 13.0 - 17.0 g/dL 40.9  81.1  91.4   Hematocrit 39.0 - 52.0 % 41.6  43.5  40.5   Platelets 150 - 400 K/uL 203  221  262         Latest Ref Rng & Units 10/07/2022    3:08 PM 06/22/2022    2:40 PM 06/11/2022   11:27 AM  CMP  Glucose 70 - 99 mg/dL 782  956  213   BUN 6 - 24 mg/dL 11  8  7    Creatinine 0.76 - 1.27 mg/dL 0.86  5.78  4.69   Sodium 134 - 144 mmol/L 143  141  139   Potassium 3.5 - 5.2 mmol/L 4.2  4.0  3.9   Chloride 96 - 106 mmol/L 103  101  98   CO2 20 - 29 mmol/L 26  25  28    Calcium 8.7 - 10.2  mg/dL 8.8  9.4  9.1   Total Protein 6.0 - 8.5 g/dL 6.7  6.7    Total Bilirubin 0.0 - 1.2 mg/dL 0.4  0.4    Alkaline Phos 44 - 121 IU/L 70  108    AST 0 - 40 IU/L 20  11    ALT 0 - 44 IU/L 19  15  RADIOGRAPHIC STUDIES: I have personally reviewed the radiological images as listed and agreed with the findings in the report. No results found.    No orders of the defined types were placed in this encounter.  I discussed the assessment and treatment plan with the patient. The patient was provided an opportunity to ask questions and all were answered. The patient agreed with the plan and demonstrated an understanding of the instructions.   The patient was advised to call back or seek an in-person evaluation if the symptoms worsen or if the condition fails to improve as anticipated.  I provided 8 minutes of non face-to-face telephone visit time during this encounter, and > 50% was spent counseling as documented under my assessment & plan.     Malachy Mood, MD 12/03/2022

## 2022-12-03 NOTE — Telephone Encounter (Signed)
Will notify [facility] to mail ibuprofen.

## 2022-12-03 NOTE — Assessment & Plan Note (Signed)
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 -This is likely reactive to his smoking, I have low suspicion for primary bone marrow disease such as CML.  His BCR/ABLPCR test came back negative, CML ruled out, I reviewed with him

## 2022-12-04 DIAGNOSIS — I63311 Cerebral infarction due to thrombosis of right middle cerebral artery: Secondary | ICD-10-CM | POA: Diagnosis not present

## 2022-12-05 DIAGNOSIS — E119 Type 2 diabetes mellitus without complications: Secondary | ICD-10-CM | POA: Diagnosis not present

## 2022-12-05 DIAGNOSIS — R3981 Functional urinary incontinence: Secondary | ICD-10-CM | POA: Diagnosis not present

## 2022-12-05 DIAGNOSIS — I63311 Cerebral infarction due to thrombosis of right middle cerebral artery: Secondary | ICD-10-CM | POA: Diagnosis not present

## 2022-12-06 DIAGNOSIS — I63311 Cerebral infarction due to thrombosis of right middle cerebral artery: Secondary | ICD-10-CM | POA: Diagnosis not present

## 2022-12-07 DIAGNOSIS — I63311 Cerebral infarction due to thrombosis of right middle cerebral artery: Secondary | ICD-10-CM | POA: Diagnosis not present

## 2022-12-08 DIAGNOSIS — I63311 Cerebral infarction due to thrombosis of right middle cerebral artery: Secondary | ICD-10-CM | POA: Diagnosis not present

## 2022-12-09 DIAGNOSIS — I63311 Cerebral infarction due to thrombosis of right middle cerebral artery: Secondary | ICD-10-CM | POA: Diagnosis not present

## 2022-12-10 DIAGNOSIS — I63311 Cerebral infarction due to thrombosis of right middle cerebral artery: Secondary | ICD-10-CM | POA: Diagnosis not present

## 2022-12-12 DIAGNOSIS — I63311 Cerebral infarction due to thrombosis of right middle cerebral artery: Secondary | ICD-10-CM | POA: Diagnosis not present

## 2022-12-13 DIAGNOSIS — I63311 Cerebral infarction due to thrombosis of right middle cerebral artery: Secondary | ICD-10-CM | POA: Diagnosis not present

## 2022-12-14 DIAGNOSIS — I63311 Cerebral infarction due to thrombosis of right middle cerebral artery: Secondary | ICD-10-CM | POA: Diagnosis not present

## 2022-12-15 ENCOUNTER — Other Ambulatory Visit: Payer: Medicaid Other | Admitting: Pharmacist

## 2022-12-15 DIAGNOSIS — I63311 Cerebral infarction due to thrombosis of right middle cerebral artery: Secondary | ICD-10-CM | POA: Diagnosis not present

## 2022-12-15 NOTE — Progress Notes (Signed)
12/15/2022 Name: Frank Schneider MRN: [id number] DOB: 1975/07/22  Chief Complaint  Patient presents with   Medication Management   Diabetes    Frank Schneider is a 47 y.o. year old male who presented for a telephone visit.   They were referred to the pharmacist by their PCP for assistance in managing diabetes and hypertension.    Subjective:  Care Team: Primary Care Provider: [name], NP ; Next Scheduled Visit: 01/07/23 Vascular: Chestine Spore: 12/22/22  Medication Access/Adherence  Current Pharmacy:  [facility] [address] [place] [address] Phone: [phone] Fax: [phone]  [facility] - [place], [place] - [address] [place] [address] Phone: [phone] Fax: [phone]   Patient reports affordability concerns with their medications: No  Patient reports access/transportation concerns to their pharmacy: No  Patient reports adherence concerns with their medications:  No    Continues to receive medication in adherence packages. He will be due for a refill next week.    Diabetes:  Current medications: Jardiance 25 mg daily, Lantus 16 units daily, Ozempic - has not started yet Medications tried in the past: metformin XR - GI upset  Date of Download: 8/2-8/15/24 % Time CGM is active: 93% Average Glucose: 182 mg/dL Glucose Management Indicator: 7.7  Glucose Variability: 31.4 (goal <36%) Time in Goal:  - Time in range 70-180: 46% - Time above range: 54% - Time below range: 1% Observed patterns: some overnight lows   Patient reports hypoglycemic episodes overnight/early morning - Libre waking him up. Notes he likes taking time away from the [place] because gets tired of the alarms   Hypertension:  Current medications: amlodipine 10 mg daily, lisinopril 20 mg daily    Patient has a validated, automated, upper arm home BP cuff- checks periodically with his mother's  cuff Current blood pressure readings readings: does not recall specific readings, but believes readings were "normal". Notes he forgot to take medications before the office visit with Dr. Terrace Arabia where BP was elevated.   PAD/HLD/ASCVD Risk Reduction  Current lipid lowering medications: atorvastatin 40 mg daily  Antiplatelet regimen: aspirin 325 mg daily  Additionally on cilostazol 100 mg twice daily.    Not interested in discussing tobacco cessation today.   Objective:  Lab Results  Component Value Date   HGBA1C 7.5 (A) 10/07/2022    Lab Results  Component Value Date   CREATININE 1.12 10/07/2022   BUN 11 10/07/2022   NA 143 10/07/2022   K 4.2 10/07/2022   CL 103 10/07/2022   CO2 26 10/07/2022    Lab Results  Component Value Date   CHOL 88 (L) 06/22/2022   HDL 30 (L) 06/22/2022   LDLCALC 43 06/22/2022   TRIG 67 06/22/2022   CHOLHDL 2.9 06/22/2022    Medications Reviewed Today     Reviewed by Alden Hipp, RPH-CPP (Pharmacist) on 12/15/22 at 1412  Med List Status: <None>   Medication Order Taking? Sig Documenting Provider Last Dose Status Informant  acetaminophen (TYLENOL) 500 MG tablet [id number]  Take 2 tablets (Total= 1,000 mg), by mouth, 2 times a day as needed. [name], MD  Active Self  amLODipine (NORVASC) 10 MG tablet [id number] Yes Take 1 tablet (10 mg total) by mouth daily. [name], NP Taking Active   aspirin 325 MG tablet [id number]  Take 1 tablet (325 mg total) by mouth daily. [name], NP  Active   atorvastatin (LIPITOR) 40  MG tablet [id number] Yes Take 1 tablet (40 mg total) by mouth daily. [name], NP Taking Active    Patient not taking:   Discontinued 06/22/22 1418 Blood Glucose Monitoring Suppl (TRUE METRIX AIR GLUCOSE METER) DEVI [id number]  1 Units by Does not apply route 2 (two) times daily. [name], NP  Active   cilostazol (PLETAL) 100 MG tablet [id number]  Take 1 tablet (100 mg total) by mouth 2 (two)  times daily before a meal. [name], MD  Active   Continuous Glucose Sensor (FREESTYLE LIBRE 3 [place]) [place] [id number]  Place 1 sensor on the skin every 14 days. Use to check glucose continuously [name], NP  Active   empagliflozin (JARDIANCE) 25 MG TABS tablet [id number] Yes Take 1 tablet (25 mg total) by mouth daily before breakfast. [name], NP Taking Active     Discontinued 06/22/22 1418 glucose blood test strip [id number]  Use as instructed [name], NP  Active   ibuprofen (ADVIL) 800 MG tablet [id number]  Take 1 tablet (800 mg total) by mouth every 8 (eight) hours as needed. [name], NP  Active   insulin glargine (LANTUS SOLOSTAR) 100 UNIT/ML Solostar Pen [id number]  Inject 16 Units into the skin at bedtime. [name], NP  Active            Med Note Clearance Coots, Shekina Cordell T   Tue Dec 15, 2022  2:11 PM)    Insulin Pen Needle (PEN NEEDLES) 32G X 4 MM MISC [id number]  Use to inject insulin daily as directed. [name], NP  Active    Patient not taking:   Discontinued 06/22/22 1418 lisinopril (ZESTRIL) 20 MG tablet [id number] Yes Take 1 tablet (20 mg total) by mouth daily. [name], NP Taking Active   Semaglutide,0.25 or 0.5MG /DOS, (OZEMPIC, 0.25 OR 0.5 MG/DOSE,) 2 MG/3ML SOPN [id number] No Inject 0.25 mg weekly for 4 weeks, then increase to 0.5 mg weekly  Patient not taking: Reported on 12/15/2022   [name], NP Not Taking Active               Assessment/Plan:   Diabetes: - Currently uncontrolled - Discussed that switching from Lantus to Ozempic will help eliminate low blood sugars and improve post prandial elevated blood sugars. Patient is amenable. He will start Ozempic tomorrow (Wednesday). Stop Lantus, start Ozempic 0.25 mg weekly as previously prescribed.  - Recommend to continue to use CGM to check glucose continuously. Continue Jardiance 25 mg daily   Hypertension: - Currently unknown control. - Reviewed long  term cardiovascular and renal outcomes of uncontrolled blood pressure - Reviewed appropriate blood pressure monitoring technique and reviewed goal blood pressure. Recommended to check home blood pressure and heart rate periodically - Recommend to ensure he takes medications prior to Vascular appointment next week   Hyperlipidemia/ASCVD Risk Reduction: - Currently controlled.  - Recommend to continue current regimen at this time   Follow Up Plan: PCP in ~ 3 weeks, PharmD 4 weeks later  Janeice Robinson, PharmD, BCACP, CPP Clinical Pharmacist [facility] [phone]

## 2022-12-15 NOTE — Patient Instructions (Addendum)
Frank Schneider,   It was great talking to you today!  Stop Lantus. Start Ozempic 0.25 mg weekly for 4 weeks. On week #5, increase to 0.5 mg weekly.   Please reach out with any questions.   Thanks!  Catie Eppie Gibson, PharmD, BCACP, CPP Clinical Pharmacist [facility] [phone]

## 2022-12-16 DIAGNOSIS — I63311 Cerebral infarction due to thrombosis of right middle cerebral artery: Secondary | ICD-10-CM | POA: Diagnosis not present

## 2022-12-17 DIAGNOSIS — I63311 Cerebral infarction due to thrombosis of right middle cerebral artery: Secondary | ICD-10-CM | POA: Diagnosis not present

## 2022-12-18 DIAGNOSIS — I63311 Cerebral infarction due to thrombosis of right middle cerebral artery: Secondary | ICD-10-CM | POA: Diagnosis not present

## 2022-12-19 DIAGNOSIS — I63311 Cerebral infarction due to thrombosis of right middle cerebral artery: Secondary | ICD-10-CM | POA: Diagnosis not present

## 2022-12-20 DIAGNOSIS — I63311 Cerebral infarction due to thrombosis of right middle cerebral artery: Secondary | ICD-10-CM | POA: Diagnosis not present

## 2022-12-20 DIAGNOSIS — E119 Type 2 diabetes mellitus without complications: Secondary | ICD-10-CM | POA: Diagnosis not present

## 2022-12-21 DIAGNOSIS — I63311 Cerebral infarction due to thrombosis of right middle cerebral artery: Secondary | ICD-10-CM | POA: Diagnosis not present

## 2022-12-22 ENCOUNTER — Ambulatory Visit: Payer: Medicaid Other | Admitting: Vascular Surgery

## 2022-12-22 ENCOUNTER — Ambulatory Visit (HOSPITAL_COMMUNITY): Payer: Medicaid Other | Attending: Vascular Surgery

## 2022-12-22 DIAGNOSIS — I63311 Cerebral infarction due to thrombosis of right middle cerebral artery: Secondary | ICD-10-CM | POA: Diagnosis not present

## 2022-12-23 DIAGNOSIS — I63311 Cerebral infarction due to thrombosis of right middle cerebral artery: Secondary | ICD-10-CM | POA: Diagnosis not present

## 2022-12-24 DIAGNOSIS — I63311 Cerebral infarction due to thrombosis of right middle cerebral artery: Secondary | ICD-10-CM | POA: Diagnosis not present

## 2022-12-25 DIAGNOSIS — I63311 Cerebral infarction due to thrombosis of right middle cerebral artery: Secondary | ICD-10-CM | POA: Diagnosis not present

## 2022-12-26 DIAGNOSIS — I63311 Cerebral infarction due to thrombosis of right middle cerebral artery: Secondary | ICD-10-CM | POA: Diagnosis not present

## 2022-12-26 NOTE — Progress Notes (Signed)
Open in error

## 2022-12-27 DIAGNOSIS — I63311 Cerebral infarction due to thrombosis of right middle cerebral artery: Secondary | ICD-10-CM | POA: Diagnosis not present

## 2022-12-28 DIAGNOSIS — I63311 Cerebral infarction due to thrombosis of right middle cerebral artery: Secondary | ICD-10-CM | POA: Diagnosis not present

## 2022-12-29 ENCOUNTER — Other Ambulatory Visit: Payer: Self-pay

## 2022-12-29 DIAGNOSIS — I63311 Cerebral infarction due to thrombosis of right middle cerebral artery: Secondary | ICD-10-CM | POA: Diagnosis not present

## 2022-12-30 ENCOUNTER — Other Ambulatory Visit (HOSPITAL_COMMUNITY): Payer: Self-pay

## 2022-12-30 DIAGNOSIS — I63311 Cerebral infarction due to thrombosis of right middle cerebral artery: Secondary | ICD-10-CM | POA: Diagnosis not present

## 2022-12-31 ENCOUNTER — Telehealth: Payer: Self-pay | Admitting: Clinical

## 2022-12-31 DIAGNOSIS — I63311 Cerebral infarction due to thrombosis of right middle cerebral artery: Secondary | ICD-10-CM | POA: Diagnosis not present

## 2022-12-31 NOTE — Telephone Encounter (Signed)
Integrated Behavioral Health Progress Note  12/31/2022 Name: Frank Schneider MRN: [id number] DOB: 1975-05-07 Frank Schneider is a 47 y.o. year old male who sees [name], NP for primary care. LCSW was initially consulted to assist patient with community resources.  Interpreter: No.   Interpreter Name & Language: none  Assessment: Patient is experiencing financial difficulty related to low income. He is also in need of additional community resources.  Ongoing Intervention: Patient called and reported he has a new phone number that he's using temporarily until he gets another phone. Updated the current new phone number in patient's chart. Also changed this number in [employer] transportation request for his appointment with PCP next week. Advised patient to call when he gets his new permanent phone to update the number in our system again.  Abigail Butts, LCSW [facility] [phone]

## 2023-01-01 ENCOUNTER — Encounter: Payer: Self-pay | Admitting: Pharmacist

## 2023-01-01 DIAGNOSIS — I63311 Cerebral infarction due to thrombosis of right middle cerebral artery: Secondary | ICD-10-CM | POA: Diagnosis not present

## 2023-01-02 ENCOUNTER — Other Ambulatory Visit: Payer: Self-pay

## 2023-01-02 DIAGNOSIS — I63311 Cerebral infarction due to thrombosis of right middle cerebral artery: Secondary | ICD-10-CM | POA: Diagnosis not present

## 2023-01-03 DIAGNOSIS — I63311 Cerebral infarction due to thrombosis of right middle cerebral artery: Secondary | ICD-10-CM | POA: Diagnosis not present

## 2023-01-04 DIAGNOSIS — I63311 Cerebral infarction due to thrombosis of right middle cerebral artery: Secondary | ICD-10-CM | POA: Diagnosis not present

## 2023-01-05 ENCOUNTER — Other Ambulatory Visit: Payer: Self-pay

## 2023-01-05 DIAGNOSIS — I63311 Cerebral infarction due to thrombosis of right middle cerebral artery: Secondary | ICD-10-CM | POA: Diagnosis not present

## 2023-01-06 DIAGNOSIS — I63311 Cerebral infarction due to thrombosis of right middle cerebral artery: Secondary | ICD-10-CM | POA: Diagnosis not present

## 2023-01-07 ENCOUNTER — Other Ambulatory Visit (HOSPITAL_COMMUNITY): Payer: Self-pay

## 2023-01-07 ENCOUNTER — Encounter: Payer: Self-pay | Admitting: Nurse Practitioner

## 2023-01-07 ENCOUNTER — Ambulatory Visit: Payer: Medicaid Other | Admitting: Nurse Practitioner

## 2023-01-07 VITALS — BP 122/67 | HR 89 | Temp 97.1°F | Ht 70.0 in | Wt 178.2 lb

## 2023-01-07 DIAGNOSIS — E785 Hyperlipidemia, unspecified: Secondary | ICD-10-CM

## 2023-01-07 DIAGNOSIS — Z794 Long term (current) use of insulin: Secondary | ICD-10-CM

## 2023-01-07 DIAGNOSIS — E1159 Type 2 diabetes mellitus with other circulatory complications: Secondary | ICD-10-CM

## 2023-01-07 DIAGNOSIS — I10 Essential (primary) hypertension: Secondary | ICD-10-CM | POA: Diagnosis not present

## 2023-01-07 DIAGNOSIS — I63311 Cerebral infarction due to thrombosis of right middle cerebral artery: Secondary | ICD-10-CM | POA: Diagnosis not present

## 2023-01-07 LAB — POCT GLYCOSYLATED HEMOGLOBIN (HGB A1C): Hemoglobin A1C: 7.4 % — AB (ref 4.0–5.6)

## 2023-01-07 MED ORDER — ASPIRIN 325 MG PO TABS
325.0000 mg | ORAL_TABLET | Freq: Every day | ORAL | 1 refills | Status: DC
Start: 2023-01-07 — End: 2023-06-28
  Filled 2023-01-07: qty 90, 90d supply, fill #0
  Filled 2023-01-21: qty 30, 30d supply, fill #0
  Filled 2023-02-15: qty 30, 30d supply, fill #1
  Filled 2023-03-24: qty 30, 30d supply, fill #2
  Filled 2023-04-14 – 2023-04-19 (×2): qty 30, 30d supply, fill #3
  Filled 2023-05-10 – 2023-05-18 (×3): qty 30, 30d supply, fill #4
  Filled 2023-06-04 – 2023-06-10 (×2): qty 30, 30d supply, fill #5

## 2023-01-07 MED ORDER — OZEMPIC (0.25 OR 0.5 MG/DOSE) 2 MG/3ML ~~LOC~~ SOPN
PEN_INJECTOR | SUBCUTANEOUS | 2 refills | Status: DC
Start: 2023-01-07 — End: 2023-03-24
  Filled 2023-01-07: qty 3, fill #0
  Filled 2023-01-25: qty 3, 28d supply, fill #0
  Filled 2023-02-22: qty 3, 28d supply, fill #1
  Filled 2023-03-16: qty 3, 28d supply, fill #2

## 2023-01-07 MED ORDER — LANTUS SOLOSTAR 100 UNIT/ML ~~LOC~~ SOPN
16.0000 [IU] | PEN_INJECTOR | Freq: Every day | SUBCUTANEOUS | 2 refills | Status: DC
Start: 2023-01-07 — End: 2023-09-03
  Filled 2023-01-07 – 2023-02-06 (×2): qty 15, 93d supply, fill #0
  Filled 2023-02-09: qty 12, 75d supply, fill #0
  Filled 2023-04-22: qty 12, 75d supply, fill #1
  Filled 2023-07-06: qty 12, 75d supply, fill #2

## 2023-01-07 MED ORDER — AMLODIPINE BESYLATE 10 MG PO TABS
10.0000 mg | ORAL_TABLET | Freq: Every day | ORAL | 1 refills | Status: DC
Start: 2023-01-07 — End: 2023-05-11
  Filled 2023-01-07: qty 90, 90d supply, fill #0
  Filled 2023-01-21: qty 30, 30d supply, fill #0
  Filled 2023-02-15: qty 30, 30d supply, fill #1
  Filled 2023-03-24: qty 30, 30d supply, fill #2
  Filled 2023-04-14 – 2023-04-19 (×2): qty 30, 30d supply, fill #3
  Filled 2023-05-10 – 2023-05-12 (×2): qty 30, 30d supply, fill #4

## 2023-01-07 MED ORDER — ATORVASTATIN CALCIUM 40 MG PO TABS
40.0000 mg | ORAL_TABLET | Freq: Every day | ORAL | 1 refills | Status: DC
Start: 2023-01-07 — End: 2023-06-28
  Filled 2023-01-07: qty 90, 90d supply, fill #0
  Filled 2023-01-21: qty 30, 30d supply, fill #0
  Filled 2023-02-15: qty 30, 30d supply, fill #1
  Filled 2023-03-24: qty 30, 30d supply, fill #2
  Filled 2023-04-14 – 2023-04-19 (×2): qty 30, 30d supply, fill #3
  Filled 2023-05-10 – 2023-05-18 (×3): qty 30, 30d supply, fill #4
  Filled 2023-06-04 – 2023-06-10 (×2): qty 30, 30d supply, fill #5

## 2023-01-07 NOTE — Patient Instructions (Addendum)
1. Type 2 diabetes mellitus with other circulatory complication, with long-term current use of insulin (HCC)  - insulin glargine (LANTUS SOLOSTAR) 100 UNIT/ML Solostar Pen; Inject 16 Units into the skin at bedtime.  Dispense: 15 mL; Refill: 2 - POCT glycosylated hemoglobin (Hb A1C) - Semaglutide,0.25 or 0.5MG /DOS, (OZEMPIC, 0.25 OR 0.5 MG/DOSE,) 2 MG/3ML SOPN; Inject 0.25 mg weekly for 4 weeks, then increase to 0.5 mg weekly  Dispense: 3 mL; Refill: 2  2. Essential hypertension  - amLODipine (NORVASC) 10 MG tablet; Take 1 tablet (10 mg total) by mouth daily.  Dispense: 90 tablet; Refill: 1 - aspirin 325 MG tablet; Take 1 tablet (325 mg total) by mouth daily.  Dispense: 90 tablet; Refill: 1  3. Hyperlipidemia, unspecified hyperlipidemia type  - atorvastatin (LIPITOR) 40 MG tablet; Take 1 tablet (40 mg total) by mouth daily.  Dispense: 90 tablet; Refill: 1   Follow up:  Follow up in 3 months

## 2023-01-07 NOTE — Progress Notes (Signed)
Subjective   Patient ID: Frank Schneider, male    DOB: February 13, 1976, 47 y.o.   MRN: [id number]  Chief Complaint  Patient presents with   Diabetes    Follow up    Referring provider: [name], NP  Frank Schneider is a 47 y.o. 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) No date: Tobacco use No date: Vision abnormalities 12/2019: Vitamin D deficiency  HPI: Diabetes He presents for his follow-up diabetic visit. He has type 2 diabetes mellitus. His disease course has been improving. There are no hypoglycemic associated symptoms. There are no diabetic associated symptoms. Pertinent negatives for diabetes include no chest pain. There are no hypoglycemic complications. Symptoms are stable. Risk factors for coronary artery disease include male sex, hypertension and diabetes mellitus. He is compliant with treatment all of the time. His weight is stable. He is following a diabetic diet. Meal planning includes avoidance of concentrated sweets. He has not had a previous visit with a dietitian. He participates in exercise intermittently. There is no change in his home blood glucose trend. His overall blood glucose range is 140-180 mg/dl. An ACE inhibitor/angiotensin II receptor blocker is being taken. Eye exam is current.  Hypertension This is a chronic problem. The problem is unchanged. The problem is controlled. Pertinent negatives include no chest pain. There are no associated agents to hypertension. Risk factors for coronary artery disease include male gender, obesity, sedentary lifestyle and diabetes mellitus. Past treatments include ACE inhibitors. There are no compliance problems.   Hyperlipidemia This is a chronic problem. The problem is controlled. Exacerbating diseases include diabetes.  Pertinent negatives include no chest pain or myalgias. There are no compliance problems.  Risk factors for coronary artery disease include diabetes mellitus and hypertension.      No Known Allergies  Immunization History  Administered Date(s) Administered   Influenza,inj,Quad PF,6+ Mos 01/15/2015   PFIZER(Purple Top)SARS-COV-2 Vaccination 01/03/2020   Pneumococcal Polysaccharide-23 07/29/2011   Tdap 01/15/2015    Tobacco History: Social History   Tobacco Use  Smoking Status Some Days   Current packs/day: 1.00   Average packs/day: 1 pack/day for 8.0 years (8.0 ttl pk-yrs)   Types: Cigarettes  Smokeless Tobacco Never  Tobacco Comments   1 pack per day.   Ready to quit: Not Answered Counseling given: Not Answered Tobacco comments: 1 pack per day.   Outpatient Encounter Medications as of 01/07/2023  Medication Sig   acetaminophen (TYLENOL) 500 MG tablet Take 2 tablets (Total= 1,000 mg), by mouth, 2 times a day as needed.   Blood Glucose Monitoring Suppl (TRUE METRIX AIR GLUCOSE METER) DEVI 1 Units by Does not apply route 2 (two) times daily.   cilostazol (PLETAL) 100 MG tablet Take 1 tablet (100 mg total) by mouth 2 (two) times daily before a meal.   Continuous Glucose Sensor (FREESTYLE LIBRE 3 SENSOR) MISC Place 1 sensor on the skin every 14 days. Use to check glucose continuously   empagliflozin (JARDIANCE) 25 MG TABS tablet Take 1 tablet (25 mg total) by mouth daily before breakfast.   glucose blood test strip Use as instructed   ibuprofen (ADVIL) 800 MG tablet Take 1 tablet (800 mg total) by mouth every 8 (eight) hours as needed.   Insulin Pen Needle (PEN NEEDLES) 32G X 4 MM MISC Use to inject  insulin daily as directed.   lisinopril (ZESTRIL) 20 MG tablet Take 1 tablet (20 mg total) by mouth daily.   [DISCONTINUED] amLODipine (NORVASC) 10 MG tablet Take 1 tablet (10 mg total) by mouth daily.   [DISCONTINUED] aspirin 325 MG tablet Take 1 tablet (325 mg total) by mouth daily.    [DISCONTINUED] atorvastatin (LIPITOR) 40 MG tablet Take 1 tablet (40 mg total) by mouth daily.   [DISCONTINUED] Semaglutide,0.25 or 0.5MG /DOS, (OZEMPIC, 0.25 OR 0.5 MG/DOSE,) 2 MG/3ML SOPN Inject 0.25 mg weekly for 4 weeks, then increase to 0.5 mg weekly   amLODipine (NORVASC) 10 MG tablet Take 1 tablet (10 mg total) by mouth daily.   aspirin 325 MG tablet Take 1 tablet (325 mg total) by mouth daily.   atorvastatin (LIPITOR) 40 MG tablet Take 1 tablet (40 mg total) by mouth daily.   insulin glargine (LANTUS SOLOSTAR) 100 UNIT/ML Solostar Pen Inject 16 Units into the skin at bedtime.   Semaglutide,0.25 or 0.5MG /DOS, (OZEMPIC, 0.25 OR 0.5 MG/DOSE,) 2 MG/3ML SOPN Inject 0.25 mg weekly for 4 weeks, then increase to 0.5 mg weekly   [DISCONTINUED] Blood Glucose Monitoring Suppl (ACCU-CHEK GUIDE) w/Device KIT Use in the morning, at noon, in the evening, and at bedtime. (Patient not taking: Reported on 06/22/2022)   [DISCONTINUED] glucose blood (TRUE METRIX BLOOD GLUCOSE TEST) test strip Use as instructed   [DISCONTINUED] insulin glargine (LANTUS SOLOSTAR) 100 UNIT/ML Solostar Pen Inject 16 Units into the skin at bedtime. (Patient not taking: Reported on 01/07/2023)   [DISCONTINUED] Lancets (FREESTYLE) lancets Use as instructed (Patient not taking: Reported on 06/22/2022)   No facility-administered encounter medications on file as of 01/07/2023.    Review of Systems  Review of Systems  Constitutional: Negative.   HENT: Negative.    Cardiovascular: Negative.  Negative for chest pain.  Gastrointestinal: Negative.   Musculoskeletal:  Negative for myalgias.  Allergic/Immunologic: Negative.   Neurological: Negative.   Psychiatric/Behavioral: Negative.       Objective:   BP 122/67   Pulse 89   Temp (!) 97.1 F (36.2 C)   Ht 5\' 10"  (1.778 m)   Wt 178 lb 3.2 oz (80.8 kg)   SpO2 98%   BMI 25.57 kg/m   Wt Readings from Last 5 Encounters:  01/07/23 178 lb 3.2 oz (80.8 kg)  11/11/22 179 lb 9.6  oz (81.5 kg)  10/07/22 183 lb (83 kg)  06/23/22 174 lb (78.9 kg)  06/22/22 174 lb (78.9 kg)     Physical Exam Vitals and nursing note reviewed.  Constitutional:      General: He is not in acute distress.    Appearance: He is well-developed.  Cardiovascular:     Rate and Rhythm: Normal rate and regular rhythm.  Pulmonary:     Effort: Pulmonary effort is normal.     Breath sounds: Normal breath sounds.  Skin:    General: Skin is warm and dry.  Neurological:     Mental Status: He is alert and oriented to person, place, and time.       Assessment & Plan:   Type 2 diabetes mellitus with other circulatory complication, with long-term current use of insulin (HCC) -     Lantus SoloStar; Inject 16 Units into the skin at bedtime.  Dispense: 15 mL; Refill: 2 -     POCT glycosylated hemoglobin (Hb A1C) -     Ozempic (0.25 or 0.5 MG/DOSE); Inject 0.25 mg weekly for 4 weeks, then increase to 0.5 mg weekly  Dispense:  3 mL; Refill: 2  Essential hypertension -     amLODIPine Besylate; Take 1 tablet (10 mg total) by mouth daily.  Dispense: 90 tablet; Refill: 1 -     Aspirin; Take 1 tablet (325 mg total) by mouth daily.  Dispense: 90 tablet; Refill: 1  Hyperlipidemia, unspecified hyperlipidemia type -     Atorvastatin Calcium; Take 1 tablet (40 mg total) by mouth daily.  Dispense: 90 tablet; Refill: 1     Return in about 3 months (around 04/08/2023).   [name], NP 01/07/2023

## 2023-01-08 ENCOUNTER — Telehealth: Payer: Self-pay | Admitting: Clinical

## 2023-01-08 DIAGNOSIS — I63311 Cerebral infarction due to thrombosis of right middle cerebral artery: Secondary | ICD-10-CM | POA: Diagnosis not present

## 2023-01-08 NOTE — Telephone Encounter (Signed)
Integrated Behavioral Health Progress Note  01/08/2023 Name: Tavi Ulman MRN: [id number] DOB: 1975/07/10 Asie Gronau is a 46 y.o. year old male who sees [name], NP for primary care. LCSW was initially consulted to assist patient with community resources.  Interpreter: No.   Interpreter Name & Language: none  Assessment: Patient is experiencing financial difficulty related to low income. He is also in need of additional community resources.  Ongoing Intervention: Patient called and asked for help with transportation to an upcoming podiatry appointment. CSW scheduled transportation with [employer] to this appointment for patient.  Abigail Butts, LCSW [facility] [phone]

## 2023-01-09 DIAGNOSIS — I63311 Cerebral infarction due to thrombosis of right middle cerebral artery: Secondary | ICD-10-CM | POA: Diagnosis not present

## 2023-01-10 DIAGNOSIS — I63311 Cerebral infarction due to thrombosis of right middle cerebral artery: Secondary | ICD-10-CM | POA: Diagnosis not present

## 2023-01-11 ENCOUNTER — Other Ambulatory Visit (HOSPITAL_COMMUNITY): Payer: Self-pay

## 2023-01-11 DIAGNOSIS — I63311 Cerebral infarction due to thrombosis of right middle cerebral artery: Secondary | ICD-10-CM | POA: Diagnosis not present

## 2023-01-13 DIAGNOSIS — I63311 Cerebral infarction due to thrombosis of right middle cerebral artery: Secondary | ICD-10-CM | POA: Diagnosis not present

## 2023-01-15 ENCOUNTER — Telehealth: Payer: Self-pay | Admitting: Clinical

## 2023-01-15 DIAGNOSIS — R3981 Functional urinary incontinence: Secondary | ICD-10-CM | POA: Diagnosis not present

## 2023-01-15 DIAGNOSIS — E119 Type 2 diabetes mellitus without complications: Secondary | ICD-10-CM | POA: Diagnosis not present

## 2023-01-15 DIAGNOSIS — I63311 Cerebral infarction due to thrombosis of right middle cerebral artery: Secondary | ICD-10-CM | POA: Diagnosis not present

## 2023-01-15 NOTE — Telephone Encounter (Signed)
Integrated Behavioral Health Progress Note  01/15/2023 Name: Frank Schneider MRN: [id number] DOB: 05/31/1975 Frank Schneider is a 47 y.o. year old male who sees [name], NP for primary care. LCSW was initially consulted to assist patient with community resources.  Interpreter: No.   Interpreter Name & Language: none  Assessment: Patient is experiencing financial difficulty related to low income. He is also in need of additional community resources.  Ongoing Intervention: Patient called and reported he hasn't gotten his delivery of incontinence supplies that he usually gets from [facility]. He suspects this is because he hadn't updated his phone number with them and doesn't know how to contact them. CSW assisted patient in calling [facility] and updated his phone number with them. They also set up his next delivery of supplies.   Abigail Butts, LCSW Patient [facility] Center [facility] [phone]

## 2023-01-16 DIAGNOSIS — I63311 Cerebral infarction due to thrombosis of right middle cerebral artery: Secondary | ICD-10-CM | POA: Diagnosis not present

## 2023-01-17 DIAGNOSIS — I63311 Cerebral infarction due to thrombosis of right middle cerebral artery: Secondary | ICD-10-CM | POA: Diagnosis not present

## 2023-01-18 DIAGNOSIS — M2042 Other hammer toe(s) (acquired), left foot: Secondary | ICD-10-CM | POA: Diagnosis not present

## 2023-01-18 DIAGNOSIS — I739 Peripheral vascular disease, unspecified: Secondary | ICD-10-CM | POA: Diagnosis not present

## 2023-01-18 DIAGNOSIS — M2041 Other hammer toe(s) (acquired), right foot: Secondary | ICD-10-CM | POA: Diagnosis not present

## 2023-01-18 DIAGNOSIS — B351 Tinea unguium: Secondary | ICD-10-CM | POA: Diagnosis not present

## 2023-01-18 DIAGNOSIS — M21961 Unspecified acquired deformity of right lower leg: Secondary | ICD-10-CM | POA: Diagnosis not present

## 2023-01-18 DIAGNOSIS — I63311 Cerebral infarction due to thrombosis of right middle cerebral artery: Secondary | ICD-10-CM | POA: Diagnosis not present

## 2023-01-18 DIAGNOSIS — L603 Nail dystrophy: Secondary | ICD-10-CM | POA: Diagnosis not present

## 2023-01-18 DIAGNOSIS — L84 Corns and callosities: Secondary | ICD-10-CM | POA: Diagnosis not present

## 2023-01-18 DIAGNOSIS — E1142 Type 2 diabetes mellitus with diabetic polyneuropathy: Secondary | ICD-10-CM | POA: Diagnosis not present

## 2023-01-18 DIAGNOSIS — M792 Neuralgia and neuritis, unspecified: Secondary | ICD-10-CM | POA: Diagnosis not present

## 2023-01-19 DIAGNOSIS — E119 Type 2 diabetes mellitus without complications: Secondary | ICD-10-CM | POA: Diagnosis not present

## 2023-01-19 DIAGNOSIS — I63311 Cerebral infarction due to thrombosis of right middle cerebral artery: Secondary | ICD-10-CM | POA: Diagnosis not present

## 2023-01-20 DIAGNOSIS — I63311 Cerebral infarction due to thrombosis of right middle cerebral artery: Secondary | ICD-10-CM | POA: Diagnosis not present

## 2023-01-20 NOTE — Telephone Encounter (Signed)
Pt is asking for help in regards to his diarrhea and needs a call

## 2023-01-21 ENCOUNTER — Other Ambulatory Visit: Payer: Self-pay

## 2023-01-21 DIAGNOSIS — I63311 Cerebral infarction due to thrombosis of right middle cerebral artery: Secondary | ICD-10-CM | POA: Diagnosis not present

## 2023-01-22 ENCOUNTER — Other Ambulatory Visit (HOSPITAL_COMMUNITY): Payer: Self-pay

## 2023-01-22 ENCOUNTER — Other Ambulatory Visit: Payer: Self-pay

## 2023-01-22 ENCOUNTER — Other Ambulatory Visit: Payer: Self-pay | Admitting: Nurse Practitioner

## 2023-01-22 DIAGNOSIS — I63311 Cerebral infarction due to thrombosis of right middle cerebral artery: Secondary | ICD-10-CM | POA: Diagnosis not present

## 2023-01-22 MED ORDER — LOPERAMIDE HCL 2 MG PO TABS
2.0000 mg | ORAL_TABLET | Freq: Four times a day (QID) | ORAL | 0 refills | Status: AC | PRN
  Filled 2023-01-22: qty 24, 6d supply, fill #0

## 2023-01-23 ENCOUNTER — Other Ambulatory Visit (HOSPITAL_COMMUNITY): Payer: Self-pay

## 2023-01-23 NOTE — Telephone Encounter (Signed)
Pt was started on ozempic and has been eating greasy food. He will pick up the medication today  for his diarrhea. KH

## 2023-01-24 DIAGNOSIS — I63311 Cerebral infarction due to thrombosis of right middle cerebral artery: Secondary | ICD-10-CM | POA: Diagnosis not present

## 2023-01-25 ENCOUNTER — Other Ambulatory Visit (HOSPITAL_COMMUNITY): Payer: Self-pay

## 2023-01-25 ENCOUNTER — Other Ambulatory Visit: Payer: Self-pay

## 2023-01-25 DIAGNOSIS — I63311 Cerebral infarction due to thrombosis of right middle cerebral artery: Secondary | ICD-10-CM | POA: Diagnosis not present

## 2023-01-26 DIAGNOSIS — I63311 Cerebral infarction due to thrombosis of right middle cerebral artery: Secondary | ICD-10-CM | POA: Diagnosis not present

## 2023-01-27 DIAGNOSIS — I63311 Cerebral infarction due to thrombosis of right middle cerebral artery: Secondary | ICD-10-CM | POA: Diagnosis not present

## 2023-01-28 DIAGNOSIS — I63311 Cerebral infarction due to thrombosis of right middle cerebral artery: Secondary | ICD-10-CM | POA: Diagnosis not present

## 2023-01-29 ENCOUNTER — Other Ambulatory Visit: Payer: Self-pay

## 2023-01-29 ENCOUNTER — Other Ambulatory Visit (HOSPITAL_COMMUNITY): Payer: Self-pay

## 2023-01-29 DIAGNOSIS — I63311 Cerebral infarction due to thrombosis of right middle cerebral artery: Secondary | ICD-10-CM | POA: Diagnosis not present

## 2023-01-30 DIAGNOSIS — I63311 Cerebral infarction due to thrombosis of right middle cerebral artery: Secondary | ICD-10-CM | POA: Diagnosis not present

## 2023-01-31 DIAGNOSIS — I63311 Cerebral infarction due to thrombosis of right middle cerebral artery: Secondary | ICD-10-CM | POA: Diagnosis not present

## 2023-02-01 ENCOUNTER — Telehealth: Payer: Self-pay

## 2023-02-01 DIAGNOSIS — I63311 Cerebral infarction due to thrombosis of right middle cerebral artery: Secondary | ICD-10-CM | POA: Diagnosis not present

## 2023-02-01 NOTE — Telephone Encounter (Signed)
Pt has an appointment Friday for pre op. Pt advised that you set up his transportation. Please advise Kh

## 2023-02-02 ENCOUNTER — Other Ambulatory Visit: Payer: Medicaid Other | Admitting: Pharmacist

## 2023-02-02 DIAGNOSIS — E1159 Type 2 diabetes mellitus with other circulatory complications: Secondary | ICD-10-CM

## 2023-02-02 DIAGNOSIS — I10 Essential (primary) hypertension: Secondary | ICD-10-CM

## 2023-02-02 DIAGNOSIS — I63311 Cerebral infarction due to thrombosis of right middle cerebral artery: Secondary | ICD-10-CM | POA: Diagnosis not present

## 2023-02-02 NOTE — Progress Notes (Signed)
02/02/2023 Name: Frank Schneider MRN: [id number] DOB: 1975-05-11  Chief Complaint  Patient presents with   Hypertension   Medication Management   Diabetes    Frank Schneider is a 47 y.o. year old male who presented for a telephone visit.   They were referred to the pharmacist by their PCP for assistance in managing diabetes, hypertension, and hyperlipidemia.    Subjective:  Care Team: Primary Care Provider: [name], NP ; Next Scheduled Visit: Friday for pre-op  Medication Access/Adherence  Current Pharmacy:  [facility] - [facility] [address] [place] [address] Phone: [phone] Fax: [phone]  [facility] - [place], [place] - [address] [place] [address] Phone: [phone] Fax: [phone]   Patient reports affordability concerns with their medications: No  Patient reports access/transportation concerns to their pharmacy: No  Patient reports adherence concerns with their medications:  No    Reports concerns today about loose stool, multiple times a day. Reports he also has smelly burps specifically while passing stool. Denies blood in the toilet, but does note that stool is dark. Does not describe as tarry. No current use of iron or pepto bismol. Does not feel that it has gotten worse since increasing Ozempic, but feels like it has been occurring around the same time as being on Ozempic. Notes he has had some improvement with cutting back on fried foods, and has tried switching to bottled water to see if associated with tap water.   Diabetes:  Current medications: Ozempic 0.5 mg weekly, Jardiance 25 mg daily  Medications tried in the past: metformin XR - GI upset  Patient reports 1 episode of hypoglycemia, but does not recall what was going on at the time. Has not been taking insulin.   Current meal patterns:  - Breakfast: generally skips, not always hungry.  - Lunch/Supper:  has cut back on fried foods - feels a little bit of change in diarrhea - Snacks: tries to avoid sugars, but will snack on trail mix; fruit;  - Drinks: water; had been drinking more koolaid but has cut back.   Hypertension:  Current medications: amlodipine 10 mg daily, lisinopril 20 mg daily    Hyperlipidemia/ASCVD Risk Reduction  Current lipid lowering medications: atorvastatin 40 mg daily  Antiplatelet regimen: aspirin 325 mg daily    Objective:  Lab Results  Component Value Date   HGBA1C 7.4 (A) 01/07/2023    Lab Results  Component Value Date   CREATININE 1.12 10/07/2022   BUN 11 10/07/2022   NA 143 10/07/2022   K 4.2 10/07/2022   CL 103 10/07/2022   CO2 26 10/07/2022    Lab Results  Component Value Date   CHOL 88 (L) 06/22/2022   HDL 30 (L) 06/22/2022   LDLCALC 43 06/22/2022   TRIG 67 06/22/2022   CHOLHDL 2.9 06/22/2022    Medications Reviewed Today     Reviewed by Alden Hipp, RPH-CPP (Pharmacist) on 02/02/23 at 1422  Med List Status: <None>   Medication Order Taking? Sig Documenting Provider Last Dose Status Informant  acetaminophen (TYLENOL) 500 MG tablet [id number]  Take 2 tablets (Total= 1,000 mg), by mouth, 2 times a day as needed. [name], MD  Active Self  amLODipine (NORVASC) 10 MG tablet [id number] Yes Take 1 tablet (10 mg total) by mouth daily. [name], NP Taking Active   aspirin 325 MG tablet [id number] Yes Take 1 tablet (325 mg total) by  mouth daily. [name], NP Taking Active   atorvastatin (LIPITOR) 40 MG tablet [id number] Yes Take 1 tablet (40 mg total) by mouth daily. [name], NP Taking Active    Patient not taking:   Discontinued 06/22/22 1418 Blood Glucose Monitoring Suppl (TRUE METRIX AIR GLUCOSE METER) DEVI [id number]  1 Units by Does not apply route 2 (two) times daily. [name], NP  Active   cilostazol (PLETAL) 100 MG tablet [id number] Yes Take 1 tablet (100 mg total) by mouth 2 (two) times  daily before a meal. [name], MD Taking Active   Continuous Glucose Sensor (FREESTYLE LIBRE 3 SENSOR) [place] [id number]  Place 1 sensor on the skin every 14 days. Use to check glucose continuously [name], NP  Active   empagliflozin (JARDIANCE) 25 MG TABS tablet [id number] Yes Take 1 tablet (25 mg total) by mouth daily before breakfast. [name], NP Taking Active     Discontinued 06/22/22 1418 glucose blood test strip [id number]  Use as instructed [name], NP  Active   ibuprofen (ADVIL) 800 MG tablet [id number] Yes Take 1 tablet (800 mg total) by mouth every 8 (eight) hours as needed. [name], NP Taking Active            Med Note Clearance Coots, Ansel Ferrall T   Tue Feb 02, 2023  2:21 PM) Taking less than once per week  insulin glargine (LANTUS SOLOSTAR) 100 UNIT/ML Solostar Pen [id number] No Inject 16 Units into the skin at bedtime.  Patient not taking: Reported on 02/02/2023   [name], NP Not Taking Active   Insulin Pen Needle (PEN NEEDLES) 32G X 4 MM MISC [id number] No Use to inject insulin daily as directed.  Patient not taking: Reported on 02/02/2023   [name], NP Not Taking Active    Patient not taking:   Discontinued 06/22/22 1418 lisinopril (ZESTRIL) 20 MG tablet [id number] Yes Take 1 tablet (20 mg total) by mouth daily. [name], NP Taking Active   loperamide (IMODIUM A-D) 2 MG tablet [id number] No Take 1 tablet (2 mg total) by mouth 4 (four) times daily as needed for diarrhea or loose stools.  Patient not taking: Reported on 02/02/2023   [name], NP Not Taking Active   Semaglutide,0.25 or 0.5MG /DOS, (OZEMPIC, 0.25 OR 0.5 MG/DOSE,) 2 MG/3ML SOPN [id number]  Inject 0.25 mg weekly for 4 weeks, then increase to 0.5 mg weekly [name], NP  Active            Med Note Clearance Coots, Erlene Devita T   Tue Feb 02, 2023  2:22 PM) Taking 0.5 mg weekly              Assessment/Plan:   Diarrhea: - Encouraged patient to pay attention  to aggravating/remitting factors between now and Friday. Will notify PCP.   Diabetes: - Currently uncontrolled but improving. Goal <7%.  - Moving forward, will consider dose increase of Ozempic. Will continue current dose at this time while above diarrhea is worked up.  - Recommend to check glucose using CGM.  - Discussed minimization of sweets/koolaid and other sugary drinks.   Hypertension: - Currently controlled per last office reading.  - Recommend to continue current regimen at this time  Hyperlipidemia/ASCVD Risk Reduction: - Currently controlled.  - Recommend to continue current regimen at this time  Follow Up Plan: phone call in 6 weeks  Catie T. Clearance Coots, PharmD, BCACP, CPP Clinical Pharmacist [facility] [phone]

## 2023-02-03 ENCOUNTER — Ambulatory Visit: Payer: Self-pay | Admitting: Nurse Practitioner

## 2023-02-03 DIAGNOSIS — I63311 Cerebral infarction due to thrombosis of right middle cerebral artery: Secondary | ICD-10-CM | POA: Diagnosis not present

## 2023-02-04 DIAGNOSIS — I63311 Cerebral infarction due to thrombosis of right middle cerebral artery: Secondary | ICD-10-CM | POA: Diagnosis not present

## 2023-02-05 ENCOUNTER — Ambulatory Visit: Payer: Self-pay | Admitting: Nurse Practitioner

## 2023-02-05 DIAGNOSIS — I63311 Cerebral infarction due to thrombosis of right middle cerebral artery: Secondary | ICD-10-CM | POA: Diagnosis not present

## 2023-02-06 ENCOUNTER — Other Ambulatory Visit (HOSPITAL_COMMUNITY): Payer: Self-pay

## 2023-02-06 DIAGNOSIS — I63311 Cerebral infarction due to thrombosis of right middle cerebral artery: Secondary | ICD-10-CM | POA: Diagnosis not present

## 2023-02-07 DIAGNOSIS — I63311 Cerebral infarction due to thrombosis of right middle cerebral artery: Secondary | ICD-10-CM | POA: Diagnosis not present

## 2023-02-08 ENCOUNTER — Other Ambulatory Visit (HOSPITAL_COMMUNITY): Payer: Self-pay

## 2023-02-08 ENCOUNTER — Other Ambulatory Visit: Payer: Self-pay

## 2023-02-08 DIAGNOSIS — I63311 Cerebral infarction due to thrombosis of right middle cerebral artery: Secondary | ICD-10-CM | POA: Diagnosis not present

## 2023-02-09 ENCOUNTER — Other Ambulatory Visit (HOSPITAL_COMMUNITY): Payer: Self-pay

## 2023-02-09 ENCOUNTER — Other Ambulatory Visit: Payer: Self-pay

## 2023-02-09 DIAGNOSIS — I63311 Cerebral infarction due to thrombosis of right middle cerebral artery: Secondary | ICD-10-CM | POA: Diagnosis not present

## 2023-02-10 ENCOUNTER — Other Ambulatory Visit: Payer: Self-pay

## 2023-02-10 ENCOUNTER — Ambulatory Visit: Payer: Self-pay | Admitting: Nurse Practitioner

## 2023-02-10 DIAGNOSIS — I63311 Cerebral infarction due to thrombosis of right middle cerebral artery: Secondary | ICD-10-CM | POA: Diagnosis not present

## 2023-02-11 DIAGNOSIS — I63311 Cerebral infarction due to thrombosis of right middle cerebral artery: Secondary | ICD-10-CM | POA: Diagnosis not present

## 2023-02-12 DIAGNOSIS — I63311 Cerebral infarction due to thrombosis of right middle cerebral artery: Secondary | ICD-10-CM | POA: Diagnosis not present

## 2023-02-13 DIAGNOSIS — I63311 Cerebral infarction due to thrombosis of right middle cerebral artery: Secondary | ICD-10-CM | POA: Diagnosis not present

## 2023-02-14 DIAGNOSIS — I63311 Cerebral infarction due to thrombosis of right middle cerebral artery: Secondary | ICD-10-CM | POA: Diagnosis not present

## 2023-02-15 ENCOUNTER — Other Ambulatory Visit: Payer: Self-pay

## 2023-02-15 ENCOUNTER — Other Ambulatory Visit: Payer: Self-pay | Admitting: Nurse Practitioner

## 2023-02-15 ENCOUNTER — Other Ambulatory Visit (HOSPITAL_COMMUNITY): Payer: Self-pay

## 2023-02-15 ENCOUNTER — Telehealth: Payer: Self-pay | Admitting: Clinical

## 2023-02-15 DIAGNOSIS — I63311 Cerebral infarction due to thrombosis of right middle cerebral artery: Secondary | ICD-10-CM | POA: Diagnosis not present

## 2023-02-15 DIAGNOSIS — K529 Noninfective gastroenteritis and colitis, unspecified: Secondary | ICD-10-CM

## 2023-02-15 NOTE — Telephone Encounter (Signed)
Patient called and stated his diarrhea has continued and gotten worse. He was unable to fill the imodium because its OTC. Please advise.   He did miss 10/16 appointment with Archie Patten but is now rescheduled for 10/28. Needs advice for GI issues until then.  Abigail Butts, LCSW [facility] [phone]

## 2023-02-15 NOTE — Telephone Encounter (Signed)
I don't believe he has seen GI before

## 2023-02-16 DIAGNOSIS — I63311 Cerebral infarction due to thrombosis of right middle cerebral artery: Secondary | ICD-10-CM | POA: Diagnosis not present

## 2023-02-17 DIAGNOSIS — I63311 Cerebral infarction due to thrombosis of right middle cerebral artery: Secondary | ICD-10-CM | POA: Diagnosis not present

## 2023-02-18 DIAGNOSIS — I63311 Cerebral infarction due to thrombosis of right middle cerebral artery: Secondary | ICD-10-CM | POA: Diagnosis not present

## 2023-02-18 DIAGNOSIS — R3981 Functional urinary incontinence: Secondary | ICD-10-CM | POA: Diagnosis not present

## 2023-02-18 DIAGNOSIS — E119 Type 2 diabetes mellitus without complications: Secondary | ICD-10-CM | POA: Diagnosis not present

## 2023-02-20 DIAGNOSIS — I63311 Cerebral infarction due to thrombosis of right middle cerebral artery: Secondary | ICD-10-CM | POA: Diagnosis not present

## 2023-02-21 DIAGNOSIS — I63311 Cerebral infarction due to thrombosis of right middle cerebral artery: Secondary | ICD-10-CM | POA: Diagnosis not present

## 2023-02-22 ENCOUNTER — Other Ambulatory Visit: Payer: Self-pay

## 2023-02-22 ENCOUNTER — Other Ambulatory Visit (HOSPITAL_COMMUNITY): Payer: Self-pay

## 2023-02-22 ENCOUNTER — Ambulatory Visit (INDEPENDENT_AMBULATORY_CARE_PROVIDER_SITE_OTHER): Payer: Medicaid Other | Admitting: Nurse Practitioner

## 2023-02-22 ENCOUNTER — Telehealth: Payer: Self-pay | Admitting: Clinical

## 2023-02-22 ENCOUNTER — Encounter: Payer: Self-pay | Admitting: Nurse Practitioner

## 2023-02-22 VITALS — BP 129/68 | HR 73 | Ht 70.0 in | Wt 176.0 lb

## 2023-02-22 DIAGNOSIS — M2042 Other hammer toe(s) (acquired), left foot: Secondary | ICD-10-CM

## 2023-02-22 DIAGNOSIS — M2041 Other hammer toe(s) (acquired), right foot: Secondary | ICD-10-CM | POA: Diagnosis not present

## 2023-02-22 DIAGNOSIS — I63311 Cerebral infarction due to thrombosis of right middle cerebral artery: Secondary | ICD-10-CM | POA: Diagnosis not present

## 2023-02-22 DIAGNOSIS — Z01818 Encounter for other preprocedural examination: Secondary | ICD-10-CM

## 2023-02-22 NOTE — Progress Notes (Signed)
Subjective   Patient ID: Frank Schneider, male    DOB: [birth year], 47 y.o.   MRN: [id number]  Chief Complaint  Patient presents with   Pre-op Exam    Referring provider: [name], NP  Frank Schneider is a 47 y.o. 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  He is scheduled 1 November to have hammertoe repair.  He will need surgery on both feet but he states that he will be getting the right foot done first.  He does have preop paperwork with him today.  We will complete this and fax it to podiatry office for him.  He has been doing well with current diabetic medications and is on CGM.  He states that for the past 30 days all of his blood sugars have been within range. Denies f/c/s, n/v/d, hemoptysis, PND, leg swelling Denies chest pain or edema    No Known Allergies  Immunization History  Administered Date(s) Administered   Influenza,inj,Quad PF,6+ Mos 01/15/2015   PFIZER(Purple Top)SARS-COV-2 Vaccination 01/03/2020   Pneumococcal Polysaccharide-23 07/29/2011   Tdap 01/15/2015    Tobacco History: Social History   Tobacco Use  Smoking Status Some Days   Current packs/day: 1.00   Average packs/day: 1 pack/day for 8.0 years (8.0 ttl pk-yrs)   Types: Cigarettes  Smokeless Tobacco Never  Tobacco Comments   1 pack per day.   Ready to quit: Not Answered Counseling given: Not Answered Tobacco comments: 1 pack per day.   Outpatient Encounter Medications as of 02/22/2023  Medication Sig   acetaminophen (TYLENOL) 500 MG tablet Take 2 tablets (Total= 1,000 mg), by mouth, 2 times a day as needed.   amLODipine (NORVASC) 10 MG tablet Take 1 tablet  (10 mg total) by mouth daily.   aspirin 325 MG tablet Take 1 tablet (325 mg total) by mouth daily.   atorvastatin (LIPITOR) 40 MG tablet Take 1 tablet (40 mg total) by mouth daily.   Blood Glucose Monitoring Suppl (TRUE METRIX AIR GLUCOSE METER) DEVI 1 Units by Does not apply route 2 (two) times daily.   cilostazol (PLETAL) 100 MG tablet Take 1 tablet (100 mg total) by mouth 2 (two) times daily before a meal.   Continuous Glucose Sensor (FREESTYLE LIBRE 3 SENSOR) MISC Place 1 sensor on the skin every 14 days. Use to check glucose continuously   empagliflozin (JARDIANCE) 25 MG TABS tablet Take 1 tablet (25 mg total) by mouth daily before breakfast.   glucose blood test strip Use as instructed   ibuprofen (ADVIL) 800 MG tablet Take 1 tablet (800 mg total) by mouth every 8 (eight) hours as needed.   insulin glargine (LANTUS SOLOSTAR) 100 UNIT/ML Solostar Pen Inject 16 Units into the skin at bedtime.   Insulin Pen Needle (PEN NEEDLES) 32G X 4 MM MISC Use to inject insulin daily as directed.   lisinopril (ZESTRIL) 20 MG tablet Take 1 tablet (20 mg total) by mouth daily.   loperamide (IMODIUM A-D) 2 MG tablet Take 1 tablet (2 mg total) by mouth 4 (four) times daily as needed for diarrhea or loose stools.   Semaglutide,0.25 or 0.5MG /DOS, (OZEMPIC, 0.25 OR 0.5 MG/DOSE,) 2 MG/3ML SOPN Inject 0.25 mg  weekly for 4 weeks, then increase to 0.5 mg weekly   [DISCONTINUED] Blood Glucose Monitoring Suppl (ACCU-CHEK GUIDE) w/Device KIT Use in the morning, at noon, in the evening, and at bedtime. (Patient not taking: Reported on 06/22/2022)   [DISCONTINUED] glucose blood (TRUE METRIX BLOOD GLUCOSE TEST) test strip Use as instructed   [DISCONTINUED] Lancets (FREESTYLE) lancets Use as instructed (Patient not taking: Reported on 06/22/2022)   No facility-administered encounter medications on file as of 02/22/2023.    Review of Systems  Review of Systems  Constitutional: Negative.   HENT: Negative.     Cardiovascular: Negative.   Gastrointestinal: Negative.   Allergic/Immunologic: Negative.   Neurological: Negative.   Psychiatric/Behavioral: Negative.       Objective:   BP 129/68 (BP Location: Right Arm, Patient Position: Sitting, Cuff Size: Normal)   Pulse 73   Ht 5\' 10"  (1.778 m)   Wt 176 lb (79.8 kg)   SpO2 98%   BMI 25.25 kg/m   Wt Readings from Last 5 Encounters:  02/22/23 176 lb (79.8 kg)  01/07/23 178 lb 3.2 oz (80.8 kg)  11/11/22 179 lb 9.6 oz (81.5 kg)  10/07/22 183 lb (83 kg)  06/23/22 174 lb (78.9 kg)     Physical Exam Vitals and nursing note reviewed.  Constitutional:      General: He is not in acute distress.    Appearance: He is well-developed.  Cardiovascular:     Rate and Rhythm: Normal rate and regular rhythm.  Pulmonary:     Effort: Pulmonary effort is normal.     Breath sounds: Normal breath sounds.  Skin:    General: Skin is warm and dry.  Neurological:     Mental Status: He is alert and oriented to person, place, and time.       Assessment & Plan:   Hammer toes of both feet  Preop examination   Patient Instructions  1. Hammer toes of both feet   2. Preop examination  Please follow with podiatry  Will complete preop forms and fax to podiatry office  Follow up:  Follow up as scheduled    Return if symptoms worsen or fail to improve.   [name], NP 02/22/2023

## 2023-02-22 NOTE — Patient Instructions (Signed)
1. Hammer toes of both feet   2. Preop examination  Please follow with podiatry  Will complete preop forms and fax to podiatry office  Follow up:  Follow up as scheduled

## 2023-02-22 NOTE — Telephone Encounter (Signed)
[facility] Progress Note  02/22/2023 Name: Aarron Mccrillis MRN: [id number] DOB: Feb 23, 1976 Anatole Sanguino is a 47 y.o. year old male who sees [name], NP for primary care. LCSW was initially consulted to assist patient with community resources.  Interpreter: No.   Interpreter Name & Language: none  Assessment: Patient is experiencing financial difficulty related to low income. He is also in need of additional community resources.  Ongoing Intervention: Patient called and indicated he hadn't heard from transportation and they usually call when they arrive. CSW had scheduled transportation for today's appointment with [employer]. Checked [employer] online and apparently patient's ride had been cancelled. Arranged transportation for patient via taxi, as he is needing to be seen today for surgery clearance.   Abigail Butts, LCSW Patient Care Center [facility] [phone]

## 2023-02-23 ENCOUNTER — Encounter (HOSPITAL_BASED_OUTPATIENT_CLINIC_OR_DEPARTMENT_OTHER): Payer: Self-pay | Admitting: Anesthesiology

## 2023-02-23 ENCOUNTER — Encounter (HOSPITAL_BASED_OUTPATIENT_CLINIC_OR_DEPARTMENT_OTHER): Payer: Self-pay | Admitting: Podiatry

## 2023-02-23 DIAGNOSIS — I63311 Cerebral infarction due to thrombosis of right middle cerebral artery: Secondary | ICD-10-CM | POA: Diagnosis not present

## 2023-02-23 NOTE — Progress Notes (Addendum)
Addendum:  Chart reviewed w/ anesthesia Dr Armond Hang MDA, via phone. As far as pt's ozempic that he had done 10/ 28,  Dr Armond Hang as long he does have any GI symptoms he is ok to proceed,  which did have when interview him.  Otherwise with other history ok to proceed barring any status change day of surgery ok to proceed.    Spoke w/ via phone for pre-op interview--- pt Lab needs dos----  [place] results------ current EKG in epic COVID test -----patient states asymptomatic no test needed Arrive at ------- 1015 on 02-26-2023 NPO after MN NO Solid Food.  Clear liquids from MN until--- 0915 Med rec completed Medications to take morning of surgery ----- norvasc Diabetic medication ----- do not take jardiance morning of surgery.  Pt stated does ozempic on Monday's, last dose 02-22-2023.  Pt was not aware and given instructions about stopping prior to surgery. Patient instructed no nail polish to be worn day of surgery Patient instructed to bring photo id and insurance card day of surgery Patient aware to have Driver (ride ) / caregiver    for 24 hours after surgery - mother, brenda Patient Special Instructions ----- n/a Pre-Op special Instructions -----  received pt's pcp, Angus Seller NP , H&P dated 02-22-2023 via fax from Dr Theresia Bough office, placed in chart. Pt has Libre 3,  pt stated will be putting a new one on today on his arm, but would not tell me which one. Sent inbox message to Dr Carlynn Herald in epic , requested orders Patient verbalized understanding of instructions that were given at this phone interview. Patient denies chest pain, sob, fever, cough at the interview.   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  Pt does have DOE w/ stairs, yard work, but can do  house work. Does not do any time of walking , avoids long distance walking due to foot pain, and no exercise.  PCP:  Angus Seller NP (lov 02-22-2023 epic/ H&P w/ chart) Vascular:  Dr C. Clark Theron Arista 06-23-2022) EKG : 06-11-2022 Echo : 04-30-2018 Stress test: none Cardiac Cath :  no Activity level:  see above Sleep Study/ CPAP : no Fasting Blood Sugar :   120s   / Checks Blood Sugar -- times a day:  multiple times w/ GCM Blood Thinner/ Instructions /Last Dose: no ASA / Instructions/ Last Dose : ASA 325mg /  per pt was given instructions from Dr Theresia Bough to continue taking but do not take morning of surgery

## 2023-02-26 ENCOUNTER — Ambulatory Visit (HOSPITAL_BASED_OUTPATIENT_CLINIC_OR_DEPARTMENT_OTHER): Admission: RE | Admit: 2023-02-26 | Payer: Medicaid Other | Source: Home / Self Care | Admitting: Podiatry

## 2023-02-26 DIAGNOSIS — Z01818 Encounter for other preprocedural examination: Secondary | ICD-10-CM

## 2023-02-26 HISTORY — DX: Personal history of other (healed) physical injury and trauma: Z87.828

## 2023-02-26 HISTORY — DX: Type 2 diabetes mellitus with diabetic neuropathy, unspecified: E11.40

## 2023-02-26 HISTORY — DX: Long term (current) use of anticoagulants: Z79.01

## 2023-02-26 HISTORY — DX: Other forms of dyspnea: R06.09

## 2023-02-26 HISTORY — DX: Thalassemia minor: D56.3

## 2023-02-26 HISTORY — DX: Elevated white blood cell count, unspecified: D72.829

## 2023-02-26 HISTORY — DX: Type 2 diabetes mellitus without complications: E11.9

## 2023-02-26 HISTORY — DX: Paresthesia of skin: R20.0

## 2023-02-26 HISTORY — DX: Other chronic pain: G89.29

## 2023-02-26 HISTORY — DX: Presence of spectacles and contact lenses: Z97.3

## 2023-02-26 HISTORY — DX: Atherosclerosis of native arteries of extremities with intermittent claudication, bilateral legs: I70.213

## 2023-02-26 SURGERY — WEIL OSTEOTOMY
Anesthesia: Monitor Anesthesia Care | Site: Toe | Laterality: Right

## 2023-02-27 DIAGNOSIS — I63311 Cerebral infarction due to thrombosis of right middle cerebral artery: Secondary | ICD-10-CM | POA: Diagnosis not present

## 2023-03-01 DIAGNOSIS — I63311 Cerebral infarction due to thrombosis of right middle cerebral artery: Secondary | ICD-10-CM | POA: Diagnosis not present

## 2023-03-02 ENCOUNTER — Other Ambulatory Visit: Payer: Self-pay

## 2023-03-02 DIAGNOSIS — I63311 Cerebral infarction due to thrombosis of right middle cerebral artery: Secondary | ICD-10-CM | POA: Diagnosis not present

## 2023-03-02 NOTE — Progress Notes (Unsigned)
03/02/2023 Name: Frank Schneider MRN: [id number] DOB: 1976-03-26  Chief Complaint  Patient presents with   Medication Management   Diabetes   Hypertension    Frank Schneider is a 47 y.o. year old male who presented for a telephone visit.   They were referred to the pharmacist by their PCP for assistance in managing diabetes, hypertension, and hyperlipidemia.   Patient is in okay spirits today. Diarrhea improved from last visit. Patient believes diarrhea was due to Ensure drinks and stopped drinking them. Tolerating medications okay and does not have any current concerns. Patient was supposed to have R foot surgery but reported that it was cancelled and he's unsure why.   Subjective:  Care Team: Primary Care Provider: [name], NP ; Next Scheduled Visit: 04/08/2023  Medication Access/Adherence  Current Pharmacy:  [facility] [address] [address] Phone: [phone] Fax: [phone]  [facility] - [place], [place] - [address] [place] [address] Phone: [phone] Fax: [phone]   Diabetes:  Current medications: Ozempic 0.5 mg weekly, Jardiance 25 mg daily  Medications tried in the past: metformin XR - GI upset  Using Libre 3 Meter Date of Download: 10/23-11/5 % Time CGM is active: 82% Average Glucose: 146 mg/dL Glucose Management Indicator: 6.8%  Glucose Variability: 22.4% (goal <36%) Time in Goal:  - Time in range 70-180: 86%% - Time above range: 14%% - Time below range: 0%%  Current meal patterns:  - Breakfast: doesn't eat very often - Lunch/Supper: meatloaf - Snacks: granola, trail mix (w/o raisins), tangerines   Current physical activity: reports he does not exercise currently   Hypertension:   Current medications: amlodipine 10 mg daily, lisinopril 20 mg daily   Patient has a validated, automated, upper arm home BP cuff Current blood pressure  readings readings: 120/59 mmHg     Hyperlipidemia/ASCVD Risk Reduction   Current lipid lowering medications: atorvastatin 40 mg daily   Antiplatelet regimen: aspirin 325 mg daily   Objective:  Lab Results  Component Value Date   HGBA1C 7.4 (A) 01/07/2023    Lab Results  Component Value Date   CREATININE 1.12 10/07/2022   BUN 11 10/07/2022   NA 143 10/07/2022   K 4.2 10/07/2022   CL 103 10/07/2022   CO2 26 10/07/2022    Lab Results  Component Value Date   CHOL 88 (L) 06/22/2022   HDL 30 (L) 06/22/2022   LDLCALC 43 06/22/2022   TRIG 67 06/22/2022   CHOLHDL 2.9 06/22/2022    Medications Reviewed Today     Reviewed by Roslyn Smiling, [facility] (Pharmacist) on 03/02/23 at 1354  Med List Status: <None>   Medication Order Taking? Sig Documenting Provider Last Dose Status Informant  acetaminophen (TYLENOL) 500 MG tablet [id number] No Take 2 tablets (Total= 1,000 mg), by mouth, 2 times a day as needed.  Patient not taking: Reported on 03/02/2023   [name], MD Not Taking Active Self  amLODipine (NORVASC) 10 MG tablet [id number] Yes Take 1 tablet (10 mg total) by mouth daily.  Patient taking differently: Take 10 mg by mouth daily.   [name], NP Taking Active Self  aspirin 325 MG tablet [id number] Yes Take 1 tablet (325 mg total) by mouth daily. [name], NP Taking Active Self  atorvastatin (LIPITOR) 40 MG tablet [id number] Yes Take 1 tablet (40 mg total) by mouth daily. [name], NP Taking Active  Patient not taking:   Discontinued 06/22/22 1418 Blood Glucose Monitoring Suppl (TRUE METRIX AIR GLUCOSE METER) DEVI [id number] Yes 1 Units by Does not apply route 2 (two) times daily. [name], NP Taking Active   cilostazol (PLETAL) 100 MG tablet [id number] Yes Take 1 tablet (100 mg total) by mouth 2 (two) times daily before a meal. [name], MD Taking Active Self  Continuous Glucose Sensor (FREESTYLE LIBRE 3 SENSOR) [place] [id number] Yes  Place 1 sensor on the skin every 14 days. Use to check glucose continuously [name], NP Taking Active   empagliflozin (JARDIANCE) 25 MG TABS tablet [id number] Yes Take 1 tablet (25 mg total) by mouth daily before breakfast.  Patient taking differently: Take 25 mg by mouth daily before breakfast.   [name], NP Taking Active Self    Discontinued 06/22/22 1418 glucose blood test strip [id number] Yes Use as instructed [name], NP Taking Active   ibuprofen (ADVIL) 800 MG tablet [id number] Yes Take 1 tablet (800 mg total) by mouth every 8 (eight) hours as needed. [name], NP Taking Active Self           Med Note Clearance Coots, CATHERINE T   Tue Feb 02, 2023  2:21 PM) Taking less than once per week  insulin glargine (LANTUS SOLOSTAR) 100 UNIT/ML Solostar Pen [id number] No Inject 16 Units into the skin at bedtime.  Patient not taking: Reported on 03/02/2023   [name], NP Not Taking Active Self           Med Note Eddie Candle   Tue Feb 23, 2023 12:12 PM) Per pt stopped doing Lantus insulin approx 09/ 2024, stated did not need it any more, his ozempic is better  Insulin Pen Needle (PEN NEEDLES) 32G X 4 MM MISC [id number] No Use to inject insulin daily as directed.  Patient not taking: Reported on 03/02/2023   [name], NP Not Taking Active    Patient not taking:   Discontinued 06/22/22 1418 lisinopril (ZESTRIL) 20 MG tablet [id number] Yes Take 1 tablet (20 mg total) by mouth daily.  Patient taking differently: Take 20 mg by mouth every evening.   [name], NP Taking Active Self  loperamide (IMODIUM A-D) 2 MG tablet [id number] No Take 1 tablet (2 mg total) by mouth 4 (four) times daily as needed for diarrhea or loose stools.  Patient not taking: Reported on 02/23/2023   [name], NP Not Taking Active Self  Semaglutide,0.25 or 0.5MG /DOS, (OZEMPIC, 0.25 OR 0.5 MG/DOSE,) 2 MG/3ML SOPN [id number] Yes Inject 0.25 mg weekly for 4 weeks, then increase to  0.5 mg weekly  Patient taking differently: Inject 0.5 mg into the skin once a week. Inject 0.25 mg weekly for 4 weeks, then increase to 0.5 mg weekly Monday's   [name], NP Taking Active Self           Med Note Eddie Candle   Tue Feb 23, 2023 11:14 AM) Per pt last dose 02-22-2023,  per pt was not aware not to have week prior to surgery on 02-26-2023            Assessment/Plan:   Diabetes: - Currently uncontrolled but close to A1c goal (<7%) and improving based on CGM data  - Reviewed goal A1c, goal fasting, and goal 2 hour post prandial glucose - Reviewed dietary modifications including healthy snack options (Greek yogurt, protein bars, fruits and vegetables)  - Recommend to  continue current regimen of Ozempic 0.5 mg weekly and Jardiance 25 mg daily. Considered increasing Ozempic to 1 mg, however, patient reported a decrease in appetite and blood glucose has been improving per CGM data so elected to hold off at this visit - Plan to reassess regimen for adjustments following next A1c check - Recommend to check glucose using CGM   Hypertension: - Currently controlled per last office reading  - Recommend to continue current regimen at this time   Hyperlipidemia/ASCVD Risk Reduction: - Currently controlled based on last lipid panel 8 months ago - LDL 43 (goal <70) - Recommend to continue current regimen at this time  Right Foot Surgery: - Called [facility] to follow-up appointment for patient. Nurse unsure why surgery was cancelled and plans to follow-up with MD and call back.   Follow Up Plan: PCP on 04/08/2023  Roslyn Smiling, PharmD PGY1 Pharmacy Resident 03/02/2023 2:18 PM

## 2023-03-03 ENCOUNTER — Telehealth: Payer: Self-pay | Admitting: Clinical

## 2023-03-03 ENCOUNTER — Other Ambulatory Visit: Payer: Self-pay

## 2023-03-03 NOTE — Telephone Encounter (Signed)
Integrated Behavioral Health Progress Note  03/03/2023 Name: Frank Schneider MRN: [id number] DOB: October 08, 1975 [name] is a 47 y.o. year old male who sees [name], NP for primary care. LCSW was initially consulted to assist with community resources.  Interpreter: No.   Interpreter Name & Language: none  Assessment: Patient experiencing financial difficulty related to low income. He also has transportation barriers.  Ongoing Intervention: Patient called CSW and requested assistance with transportation for an appointment with his podiatrist 03/08/23. Scheduled this transportation for patient via [employer]. Advised patient that CSW will be leaving the practice at the end of the month. Patient requested to schedule an in-person visit with patient during this month. Did schedule this visit for 11/18 and arranged transportation via [employer].  Abigail Butts, LCSW Patient [facility] [phone]

## 2023-03-04 ENCOUNTER — Other Ambulatory Visit: Payer: Self-pay

## 2023-03-05 DIAGNOSIS — I63311 Cerebral infarction due to thrombosis of right middle cerebral artery: Secondary | ICD-10-CM | POA: Diagnosis not present

## 2023-03-06 DIAGNOSIS — I63311 Cerebral infarction due to thrombosis of right middle cerebral artery: Secondary | ICD-10-CM | POA: Diagnosis not present

## 2023-03-07 DIAGNOSIS — I63311 Cerebral infarction due to thrombosis of right middle cerebral artery: Secondary | ICD-10-CM | POA: Diagnosis not present

## 2023-03-08 ENCOUNTER — Telehealth: Payer: Self-pay | Admitting: Clinical

## 2023-03-08 DIAGNOSIS — I63311 Cerebral infarction due to thrombosis of right middle cerebral artery: Secondary | ICD-10-CM | POA: Diagnosis not present

## 2023-03-08 NOTE — Telephone Encounter (Signed)
Integrated Behavioral Health Progress Note  03/08/2023 Name: Frank Schneider MRN: [id number] DOB: Aug 28, 1975 Frank Schneider is a 47 y.o. year old male who sees [name], NP for primary care. LCSW was initially consulted to assist with community resources.  Interpreter: No.   Interpreter Name & Language: none  Assessment: Patient experiencing financial difficulty related to low income. He also has transportation barriers.  Ongoing Intervention: Patient called CSW and asked about his upcoming appointments. Advised patient that he has an appointment with CSW in the office on 11/18 and with his PCP on 12/12. He also had an appointment with his podiatrist at [facility] today. Patient indicated that he had forgotten about that appointment and didn't attend it. Advised patient to call them and reschedule the appointment. He did reschedule it for 11/15 and CSW scheduled a new ride with [employer] to that appointment.  Abigail Butts, LCSW Patient [facility] Center [facility] [phone]

## 2023-03-09 DIAGNOSIS — I63311 Cerebral infarction due to thrombosis of right middle cerebral artery: Secondary | ICD-10-CM | POA: Diagnosis not present

## 2023-03-10 ENCOUNTER — Other Ambulatory Visit: Payer: Self-pay

## 2023-03-10 DIAGNOSIS — I63311 Cerebral infarction due to thrombosis of right middle cerebral artery: Secondary | ICD-10-CM | POA: Diagnosis not present

## 2023-03-11 DIAGNOSIS — I63311 Cerebral infarction due to thrombosis of right middle cerebral artery: Secondary | ICD-10-CM | POA: Diagnosis not present

## 2023-03-12 ENCOUNTER — Telehealth: Payer: Self-pay | Admitting: Clinical

## 2023-03-12 DIAGNOSIS — I63311 Cerebral infarction due to thrombosis of right middle cerebral artery: Secondary | ICD-10-CM | POA: Diagnosis not present

## 2023-03-12 NOTE — Telephone Encounter (Signed)
Integrated Behavioral Health Progress Note  03/12/2023 Name: Frank Schneider MRN: [id number] DOB: 11/29/1975 Wavely Deichert is a 47 y.o. year old male who sees [name], NP for primary care. LCSW was initially consulted to assist with community resources.  Interpreter: No.   Interpreter Name & Language: none  Assessment: Patient experiencing financial difficulty related to low income. He also has transportation barriers.  Ongoing Intervention: Called patient and reminded him of podiatry appointment today and appointment with CSW on Monday, 11/18. CSW has already arranged transportation with [employer] for these appointments.  Abigail Butts, LCSW Patient [facility] [phone]

## 2023-03-12 NOTE — Telephone Encounter (Signed)
Integrated Behavioral Health Progress Note  03/12/2023 Name: Frank Schneider MRN: [id number] DOB: [birth year] Frank Schneider is a 47 y.o. year old male who sees [name], NP for primary care. LCSW was initially consulted to assist with community resources.  Interpreter: No.   Interpreter Name & Language: none  Assessment: Patient experiencing financial difficulty related to low income. He also has transportation barriers.  Ongoing Intervention: Patient called and advised that the transportation to his podiatry appointment did not call him or arrive today. He rescheduled his appointment for 03/19/23 at 2pm. CSW arranged transportation with taxi for appointment with [place] on 11/18, as patient has had trouble with [employer] transportation. Will plan to discuss how patient can schedule his own rides to appointments when patient is here 11/18.   Abigail Butts, LCSW Patient Care Center [facility] [phone]

## 2023-03-13 DIAGNOSIS — I63311 Cerebral infarction due to thrombosis of right middle cerebral artery: Secondary | ICD-10-CM | POA: Diagnosis not present

## 2023-03-14 DIAGNOSIS — I63311 Cerebral infarction due to thrombosis of right middle cerebral artery: Secondary | ICD-10-CM | POA: Diagnosis not present

## 2023-03-15 ENCOUNTER — Ambulatory Visit: Payer: Medicaid Other | Admitting: Clinical

## 2023-03-15 DIAGNOSIS — I63311 Cerebral infarction due to thrombosis of right middle cerebral artery: Secondary | ICD-10-CM | POA: Diagnosis not present

## 2023-03-15 NOTE — Progress Notes (Signed)
Integrated Behavioral Health Progress Note  03/15/2023 Name: Lonzie Toran MRN: [id number] DOB: 1975/08/13 Rowley Groeneweg is a 46 y.o. year old male who sees [name], NP for primary care. LCSW was initially consulted to assist with community resources.  Interpreter: No.   Interpreter Name & Language: none  Assessment: Patient experiencing financial difficulty related to low income. He also has transportation barriers.  Ongoing Intervention: Met with patient at the [facility]). Discussed transportation barriers. He tried calling [employer] to change his phone number with them, suspecting that the change in his phone number was why he hadn't been contacted regarding trips to his recent appointments. He was unable to speak to an agent with [employer] though.   CSW called social services and requested to change patient's phone number with them. They placed patient in a queue to receive a call back from caseworker to update his file with this new number.  Patient reported he continues to experience diarrhea. His provider did refer him to GI a few weeks ago. He has not received a call from GI to schedule. CSW provided patient with the [facility] number to call and schedule.  Patient will need transportation to his upcoming podiatry appointment on 11/22 and his PCP appointment on 12/12. Will need to make sure his phone number is updated with DSS and [employer] first though.   Abigail Butts, LCSW Patient Care Center [facility] [phone]

## 2023-03-16 ENCOUNTER — Other Ambulatory Visit (HOSPITAL_COMMUNITY): Payer: Self-pay

## 2023-03-16 DIAGNOSIS — I63311 Cerebral infarction due to thrombosis of right middle cerebral artery: Secondary | ICD-10-CM | POA: Diagnosis not present

## 2023-03-17 DIAGNOSIS — I63311 Cerebral infarction due to thrombosis of right middle cerebral artery: Secondary | ICD-10-CM | POA: Diagnosis not present

## 2023-03-18 DIAGNOSIS — I63311 Cerebral infarction due to thrombosis of right middle cerebral artery: Secondary | ICD-10-CM | POA: Diagnosis not present

## 2023-03-19 ENCOUNTER — Telehealth: Payer: Self-pay | Admitting: Clinical

## 2023-03-19 DIAGNOSIS — M2042 Other hammer toe(s) (acquired), left foot: Secondary | ICD-10-CM | POA: Diagnosis not present

## 2023-03-19 DIAGNOSIS — L603 Nail dystrophy: Secondary | ICD-10-CM | POA: Diagnosis not present

## 2023-03-19 DIAGNOSIS — E1142 Type 2 diabetes mellitus with diabetic polyneuropathy: Secondary | ICD-10-CM | POA: Diagnosis not present

## 2023-03-19 DIAGNOSIS — B351 Tinea unguium: Secondary | ICD-10-CM | POA: Diagnosis not present

## 2023-03-19 DIAGNOSIS — M792 Neuralgia and neuritis, unspecified: Secondary | ICD-10-CM | POA: Diagnosis not present

## 2023-03-19 DIAGNOSIS — I63311 Cerebral infarction due to thrombosis of right middle cerebral artery: Secondary | ICD-10-CM | POA: Diagnosis not present

## 2023-03-19 DIAGNOSIS — L84 Corns and callosities: Secondary | ICD-10-CM | POA: Diagnosis not present

## 2023-03-19 DIAGNOSIS — M2041 Other hammer toe(s) (acquired), right foot: Secondary | ICD-10-CM | POA: Diagnosis not present

## 2023-03-19 DIAGNOSIS — I739 Peripheral vascular disease, unspecified: Secondary | ICD-10-CM | POA: Diagnosis not present

## 2023-03-19 DIAGNOSIS — M21961 Unspecified acquired deformity of right lower leg: Secondary | ICD-10-CM | POA: Diagnosis not present

## 2023-03-19 NOTE — Telephone Encounter (Addendum)
Integrated Behavioral Health Progress Note  03/19/2023 Name: Frank Schneider MRN: [id number] DOB: 1975-12-15 Frank Schneider is a 47 y.o. year old male who sees [name], NP for primary care. LCSW was initially consulted to assist with community resources.  Interpreter: No.   Interpreter Name & Language: none  Assessment: Patient experiencing financial difficulty related to low income. He also has transportation barriers.  Ongoing Intervention:  Called patient and reminded him of podiatry appointment today. Arranged transportation for patient to podiatry appointment.   Abigail Butts, LCSW Patient Care Center [facility] [phone]

## 2023-03-20 DIAGNOSIS — I63311 Cerebral infarction due to thrombosis of right middle cerebral artery: Secondary | ICD-10-CM | POA: Diagnosis not present

## 2023-03-20 DIAGNOSIS — E119 Type 2 diabetes mellitus without complications: Secondary | ICD-10-CM | POA: Diagnosis not present

## 2023-03-20 DIAGNOSIS — R3981 Functional urinary incontinence: Secondary | ICD-10-CM | POA: Diagnosis not present

## 2023-03-21 DIAGNOSIS — I63311 Cerebral infarction due to thrombosis of right middle cerebral artery: Secondary | ICD-10-CM | POA: Diagnosis not present

## 2023-03-22 DIAGNOSIS — I63311 Cerebral infarction due to thrombosis of right middle cerebral artery: Secondary | ICD-10-CM | POA: Diagnosis not present

## 2023-03-23 ENCOUNTER — Other Ambulatory Visit: Payer: Self-pay

## 2023-03-23 DIAGNOSIS — I63311 Cerebral infarction due to thrombosis of right middle cerebral artery: Secondary | ICD-10-CM | POA: Diagnosis not present

## 2023-03-24 ENCOUNTER — Emergency Department (HOSPITAL_COMMUNITY): Payer: Medicaid Other

## 2023-03-24 ENCOUNTER — Encounter (HOSPITAL_COMMUNITY): Payer: Self-pay

## 2023-03-24 ENCOUNTER — Emergency Department (HOSPITAL_COMMUNITY)
Admission: EM | Admit: 2023-03-24 | Discharge: 2023-03-24 | Disposition: A | Discharge status: Home / Self Care | Payer: Medicaid Other | Attending: Emergency Medicine | Admitting: Emergency Medicine

## 2023-03-24 ENCOUNTER — Other Ambulatory Visit: Payer: Self-pay

## 2023-03-24 ENCOUNTER — Other Ambulatory Visit: Payer: Self-pay | Admitting: Nurse Practitioner

## 2023-03-24 ENCOUNTER — Other Ambulatory Visit (HOSPITAL_COMMUNITY): Payer: Self-pay

## 2023-03-24 DIAGNOSIS — R109 Unspecified abdominal pain: Secondary | ICD-10-CM | POA: Insufficient documentation

## 2023-03-24 DIAGNOSIS — E1159 Type 2 diabetes mellitus with other circulatory complications: Secondary | ICD-10-CM

## 2023-03-24 DIAGNOSIS — Z7984 Long term (current) use of oral hypoglycemic drugs: Secondary | ICD-10-CM | POA: Diagnosis not present

## 2023-03-24 DIAGNOSIS — Z794 Long term (current) use of insulin: Secondary | ICD-10-CM | POA: Insufficient documentation

## 2023-03-24 DIAGNOSIS — D72829 Elevated white blood cell count, unspecified: Secondary | ICD-10-CM | POA: Diagnosis not present

## 2023-03-24 DIAGNOSIS — E119 Type 2 diabetes mellitus without complications: Secondary | ICD-10-CM | POA: Insufficient documentation

## 2023-03-24 DIAGNOSIS — Z7982 Long term (current) use of aspirin: Secondary | ICD-10-CM | POA: Diagnosis not present

## 2023-03-24 DIAGNOSIS — R197 Diarrhea, unspecified: Secondary | ICD-10-CM | POA: Diagnosis not present

## 2023-03-24 DIAGNOSIS — I63311 Cerebral infarction due to thrombosis of right middle cerebral artery: Secondary | ICD-10-CM | POA: Diagnosis not present

## 2023-03-24 LAB — CBC WITH DIFFERENTIAL/PLATELET
Abs Immature Granulocytes: 0.11 10*3/uL — ABNORMAL HIGH (ref 0.00–0.07)
Basophils Absolute: 0.1 10*3/uL (ref 0.0–0.1)
Basophils Relative: 0 %
Eosinophils Absolute: 0.2 10*3/uL (ref 0.0–0.5)
Eosinophils Relative: 1 %
HCT: 45.8 % (ref 39.0–52.0)
Hemoglobin: 14.1 g/dL (ref 13.0–17.0)
Immature Granulocytes: 1 %
Lymphocytes Relative: 9 %
Lymphs Abs: 2 10*3/uL (ref 0.7–4.0)
MCH: 19.7 pg — ABNORMAL LOW (ref 26.0–34.0)
MCHC: 30.8 g/dL (ref 30.0–36.0)
MCV: 63.9 fL — ABNORMAL LOW (ref 80.0–100.0)
Monocytes Absolute: 1.2 10*3/uL — ABNORMAL HIGH (ref 0.1–1.0)
Monocytes Relative: 5 %
Neutro Abs: 20 10*3/uL — ABNORMAL HIGH (ref 1.7–7.7)
Neutrophils Relative %: 84 %
Platelets: 209 10*3/uL (ref 150–400)
RBC: 7.17 MIL/uL — ABNORMAL HIGH (ref 4.22–5.81)
RDW: 18.9 % — ABNORMAL HIGH (ref 11.5–15.5)
WBC: 23.6 10*3/uL — ABNORMAL HIGH (ref 4.0–10.5)
nRBC: 0.1 % (ref 0.0–0.2)

## 2023-03-24 LAB — COMPREHENSIVE METABOLIC PANEL
ALT: 29 U/L (ref 0–44)
AST: 31 U/L (ref 15–41)
Albumin: 4.6 g/dL (ref 3.5–5.0)
Alkaline Phosphatase: 81 U/L (ref 38–126)
Anion gap: 10 (ref 5–15)
BUN: 17 mg/dL (ref 6–20)
CO2: 25 mmol/L (ref 22–32)
Calcium: 9.5 mg/dL (ref 8.9–10.3)
Chloride: 104 mmol/L (ref 98–111)
Creatinine, Ser: 1.04 mg/dL (ref 0.61–1.24)
GFR, Estimated: >60 mL/min (ref >60)
Glucose, Bld: 120 mg/dL — ABNORMAL HIGH (ref 70–99)
Potassium: 3.7 mmol/L (ref 3.5–5.1)
Sodium: 139 mmol/L (ref 135–145)
Total Bilirubin: 0.7 mg/dL (ref <1.2)
Total Protein: 8 g/dL (ref 6.5–8.1)

## 2023-03-24 LAB — C DIFFICILE QUICK SCREEN W PCR REFLEX
C Diff antigen: NEGATIVE
C Diff interpretation: NOT DETECTED
C Diff toxin: NEGATIVE

## 2023-03-24 LAB — LIPASE, BLOOD: Lipase: 42 U/L (ref 11–51)

## 2023-03-24 MED ORDER — OZEMPIC (0.25 OR 0.5 MG/DOSE) 2 MG/3ML ~~LOC~~ SOPN
PEN_INJECTOR | SUBCUTANEOUS | 2 refills | Status: DC
  Filled 2023-03-24 – 2023-04-12 (×2): qty 3, 28d supply, fill #0
  Filled 2023-05-10: qty 3, 28d supply, fill #1

## 2023-03-24 MED ORDER — SODIUM CHLORIDE 0.9 % IV BOLUS
1000.0000 mL | Freq: Once | INTRAVENOUS | Status: AC
  Administered 2023-03-24: 1000 mL via INTRAVENOUS

## 2023-03-24 MED ORDER — IOHEXOL 300 MG/ML  SOLN
100.0000 mL | Freq: Once | INTRAMUSCULAR | Status: AC | PRN
  Administered 2023-03-24: 100 mL via INTRAVENOUS

## 2023-03-24 MED ORDER — DICYCLOMINE HCL 20 MG PO TABS
20.0000 mg | ORAL_TABLET | Freq: Two times a day (BID) | ORAL | 0 refills | Status: AC | PRN
  Filled 2023-03-24 (×2): qty 20, 10d supply, fill #0

## 2023-03-24 MED ORDER — FREESTYLE LIBRE 3 SENSOR MISC
1 refills | Status: AC
  Filled 2023-03-24 – 2023-03-26 (×2): qty 6, 84d supply, fill #0
  Filled 2023-03-31: qty 2, 28d supply, fill #0
  Filled 2023-04-28: qty 2, 28d supply, fill #1
  Filled 2023-05-21: qty 2, 28d supply, fill #2
  Filled 2023-06-04 – 2023-06-11 (×3): qty 2, 28d supply, fill #3
  Filled 2023-07-16: qty 2, 28d supply, fill #4
  Filled 2023-08-06: qty 2, 28d supply, fill #5

## 2023-03-24 NOTE — ED Provider Notes (Signed)
[place] EMERGENCY DEPARTMENT AT [facility] Provider Note   CSN: [id number] Arrival date & time: 03/24/23  0830    History Chief Complaint  Patient presents with   Diarrhea    Frank Schneider is a 47 y.o. male, hx of DMII, who presents to the ED secondary to diarrhea for the last 1 to 2 months.  He states that he has 20+ episodes of diarrhea the last 4 to 5 days, and then resolved, for about 2 to 3 days and then reappeared.  States it may be related to the Ozempic, but is not sure.  Recently started Ozempic.  Has stopped taking it for the last 2 weeks and has continued to have diarrhea.  States this week, it has been worse, and he is having 20+ episodes of diarrhea, daily for the last for 5 days.  He states he now does not have any nausea, vomiting, but does endorse some lower abdominal cramping.  Denies any recent antibiotics, or out of the country travel.  No history of IBS or IBD.  Denies any fevers, blood in stool, or black stools.  Home Medications Prior to Admission medications   Medication Sig Start Date End Date Taking? Authorizing Provider  dicyclomine (BENTYL) 20 MG tablet Take 1 tablet (20 mg total) by mouth 2 (two) times daily as needed for spasms. 03/24/23  Yes Zale Marcotte L, PA  acetaminophen (TYLENOL) 500 MG tablet Take 2 tablets (Total= 1,000 mg), by mouth, 2 times a day as needed. Patient not taking: Reported on 03/02/2023 11/04/21   [name], MD  amLODipine (NORVASC) 10 MG tablet Take 1 tablet (10 mg total) by mouth daily. Patient taking differently: Take 10 mg by mouth daily. 01/07/23   [name], NP  aspirin 325 MG tablet Take 1 tablet (325 mg total) by mouth daily. 01/07/23   [name], NP  atorvastatin (LIPITOR) 40 MG tablet Take 1 tablet (40 mg total) by mouth daily. 01/07/23   [name], NP  Blood Glucose Monitoring Suppl (TRUE METRIX AIR GLUCOSE METER) DEVI 1 Units by Does not apply route 2 (two) times daily. 06/22/22    [name], NP  cilostazol (PLETAL) 100 MG tablet Take 1 tablet (100 mg total) by mouth 2 (two) times daily before a meal. 09/03/22   [name], MD  Continuous Glucose Sensor (FREESTYLE LIBRE 3 SENSOR) MISC Place 1 sensor on the skin every 14 days. Use to check glucose continuously 03/24/23   Paseda, Baird Kay, FNP  empagliflozin (JARDIANCE) 25 MG TABS tablet Take 1 tablet (25 mg total) by mouth daily before breakfast. Patient taking differently: Take 25 mg by mouth daily before breakfast. 11/19/22   [name], NP  glucose blood test strip Use as instructed 06/22/22   [name], NP  ibuprofen (ADVIL) 800 MG tablet Take 1 tablet (800 mg total) by mouth every 8 (eight) hours as needed. 12/03/22   [name], NP  insulin glargine (LANTUS SOLOSTAR) 100 UNIT/ML Solostar Pen Inject 16 Units into the skin at bedtime. Patient not taking: Reported on 03/02/2023 01/07/23   [name], NP  Insulin Pen Needle (PEN NEEDLES) 32G X 4 MM MISC Use to inject insulin daily as directed. Patient not taking: Reported on 03/02/2023 10/07/22   [name], NP  lisinopril (ZESTRIL) 20 MG tablet Take 1 tablet (20 mg total) by mouth daily. Patient taking differently: Take 20 mg by mouth every evening. 11/19/22  [name], NP  loperamide (IMODIUM A-D) 2 MG tablet Take 1 tablet (2 mg total) by mouth 4 (four) times daily as needed for diarrhea or loose stools. Patient not taking: Reported on 02/23/2023 01/22/23   [name], NP  Semaglutide,0.25 or 0.5MG /DOS, (OZEMPIC, 0.25 OR 0.5 MG/DOSE,) 2 MG/3ML SOPN Inject 0.25 mg weekly for 4 weeks, then increase to 0.5 mg weekly 03/24/23   Donell Beers, FNP  Blood Glucose Monitoring Suppl (ACCU-CHEK GUIDE) w/Device KIT Use in the morning, at noon, in the evening, and at bedtime. Patient not taking: Reported on 06/22/2022 10/16/20   Barbette Merino, NP  glucose blood (TRUE METRIX BLOOD GLUCOSE TEST) test strip Use as instructed  07/20/18   Mike Gip, FNP  Lancets (FREESTYLE) lancets Use as instructed Patient not taking: Reported on 06/22/2022 01/03/15   Richarda Overlie, MD      Allergies    Patient has no known allergies.    Review of Systems   Review of Systems  Gastrointestinal:  Positive for abdominal pain and diarrhea. Negative for blood in stool.    Physical Exam Updated Vital Signs BP (!) 149/77   Pulse 66   Temp 98.2 F (36.8 C)   Resp 16   SpO2 100%  Physical Exam Vitals and nursing note reviewed.  Constitutional:      General: He is not in acute distress.    Appearance: He is well-developed.  HENT:     Head: Normocephalic and atraumatic.  Eyes:     Conjunctiva/sclera: Conjunctivae normal.  Cardiovascular:     Rate and Rhythm: Normal rate and regular rhythm.     Heart sounds: No murmur heard. Pulmonary:     Effort: Pulmonary effort is normal. No respiratory distress.     Breath sounds: Normal breath sounds.  Abdominal:     Palpations: Abdomen is soft.     Tenderness: There is no abdominal tenderness.  Musculoskeletal:        General: No swelling.     Cervical back: Neck supple.  Skin:    General: Skin is warm and dry.     Capillary Refill: Capillary refill takes less than 2 seconds.  Neurological:     Mental Status: He is alert.  Psychiatric:        Mood and Affect: Mood normal.     ED Results / Procedures / Treatments   Labs (all labs ordered are listed, but only abnormal results are displayed) Labs Reviewed  CBC WITH DIFFERENTIAL/PLATELET - Abnormal; Notable for the following components:      Result Value   WBC 23.6 (*)    RBC 7.17 (*)    MCV 63.9 (*)    MCH 19.7 (*)    RDW 18.9 (*)    Neutro Abs 20.0 (*)    Monocytes Absolute 1.2 (*)    Abs Immature Granulocytes 0.11 (*)    All other components within normal limits  COMPREHENSIVE METABOLIC PANEL - Abnormal; Notable for the following components:   Glucose, Bld 120 (*)    All other components within normal limits   GASTROINTESTINAL PANEL BY PCR, STOOL (REPLACES STOOL CULTURE)  C DIFFICILE QUICK SCREEN W PCR REFLEX    LIPASE, BLOOD    EKG None  Radiology CT ABDOMEN PELVIS W CONTRAST  Result Date: 03/24/2023 CLINICAL DATA:  Chronic diarrhea for 4 weeks. Recent initiation of Ozempic therapy. EXAM: CT ABDOMEN AND PELVIS WITH CONTRAST TECHNIQUE: Multidetector CT imaging of the abdomen and pelvis was performed using  the standard protocol following bolus administration of intravenous contrast. RADIATION DOSE REDUCTION: This exam was performed according to the departmental dose-optimization program which includes automated exposure control, adjustment of the mA and/or kV according to patient size and/or use of iterative reconstruction technique. CONTRAST:  OMNIPAQUE IOHEXOL 300 MG/ML  SOLN COMPARISON:  Abdominopelvic CT 06/02/2018. FINDINGS: Lower chest: Clear lung bases. No significant pleural or pericardial effusion. Incompletely visualized moderate bilateral gynecomastia. Hepatobiliary: None no evidence of gallstones, gallbladder wall thickening or biliary dilatation. Pancreas: Unremarkable. No pancreatic ductal dilatation or surrounding inflammatory changes. Spleen: Normal in size without focal abnormality. Adrenals/Urinary Tract: Both adrenal glands appear normal. No evidence of urinary tract calculus, suspicious renal lesion or hydronephrosis. The bladder appears unremarkable for its degree of distention. Stomach/Bowel: No enteric contrast administered. Prominent ingested material within the stomach. The Myli Pae bowel and colon are fluid-filled without distension, wall thickening or surrounding inflammation. Retrocecal surgical clips consistent with prior appendectomy. Vascular/Lymphatic: There are no enlarged abdominal or pelvic lymph nodes. Age advanced aortoiliac atherosclerosis without evidence of aneurysm or large vessel occlusion. Reproductive: The prostate gland and seminal vesicles appear unremarkable.  Other: No evidence of abdominal wall mass or hernia. No ascites or pneumoperitoneum. Musculoskeletal: No acute or significant osseous findings. Mild multilevel spondylosis. IMPRESSION: 1. Fluid-filled Joscelyn Hardrick bowel and colon without distension, wall thickening or surrounding inflammation, consistent with reported history of chronic diarrhea. No evidence of bowel wall thickening or surrounding inflammation. 2. No other acute findings or explanation for the patient's symptoms. 3. 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 4.  Aortic Atherosclerosis (ICD10-I70.0). Electronically Signed   By: Carey Bullocks M.D.   On: 03/24/2023 11:50    Procedures Procedures    Medications Ordered in ED Medications  sodium chloride 0.9 % bolus 1,000 mL (0 mLs Intravenous Stopped 03/24/23 1042)  iohexol (OMNIPAQUE) 300 MG/ML solution 100 mL (100 mLs Intravenous Contrast Given 03/24/23 1028)    ED Course/ Medical Decision Making/ A&P                                 Medical Decision Making Patient is a 47 year old male, history of diabetes, here for diarrhea that is been going on for the last couple months.  Denies any bloody stools, fevers, chills.  He states that he has bilateral lower quadrant abdominal tenderness, however appears to be nontender on exam.  Will obtain a CT abdomen pelvis, given the frequency of diarrhea, as well as blood work, and stool studies. Medication he is changes his Ozempic, which she has not taken for the last 2 weeks, and started couple months ago.  Amount and/or Complexity of Data Reviewed Labs: ordered.    Details: Leukocytosis of 23K, no AKI Radiology: ordered.    Details: CT abdomen pelvis shows findings consistent with chronic diarrhea Discussion of management or test interpretation with external provider(s): Discussed with patient, findings concerning for chronic diarrhea, which historian endorses.  I spoke with Dr. Ewing Schlein, who suggest stool studies, which have been ordered.   Patient was able to submit some stool studies, and they are pending at this time.  GI to follow-up on this, and I provided patient with information, to estasblish with GI.  We discussed return precautions, and he voiced understanding.  Bentyl sent, to help with some spasms, when he has large bouts of diarrhea  Risk Prescription drug management.   Final Clinical Impression(s) / ED Diagnoses Final diagnoses:  Diarrhea, unspecified type  Rx / DC Orders ED Discharge Orders          Ordered    GI Profile, Stool, PCR  Status:  Canceled        03/24/23 1251    C Difficile Quick Screen w PCR reflex  Status:  Canceled        03/24/23 1251    dicyclomine (BENTYL) 20 MG tablet  2 times daily PRN        03/24/23 1305              Katanya Schlie, Harley Alto, PA 03/24/23 1309    Bethann Berkshire, MD 03/29/23 1022

## 2023-03-24 NOTE — Discharge Instructions (Addendum)
The cause of your diarrhea is unclear at this time.  I spoke with the GI doctor, and we have put a referral in for you, to get in sooner.  Please call the number above, for an appointment.  Return to the ER if you feel like your symptoms are worsening, you have intractable nausea, vomiting, fever, chills, or bloody stools.

## 2023-03-24 NOTE — ED Triage Notes (Signed)
EMS reports from home, chronic diarrhea x 4 weeks. Pt recently started Oxempic and states it started about same time. Pt has seen PCP and told it should resolve and was not given any Rx. Pt wants to be evaluated for dehydration. Denies Nausea or emesis.  BP 166/80 HR 100 RR 18 Sp02 99 RA CBG 144

## 2023-03-26 ENCOUNTER — Other Ambulatory Visit: Payer: Self-pay

## 2023-03-26 ENCOUNTER — Other Ambulatory Visit (HOSPITAL_COMMUNITY): Payer: Self-pay

## 2023-03-26 DIAGNOSIS — I63311 Cerebral infarction due to thrombosis of right middle cerebral artery: Secondary | ICD-10-CM | POA: Diagnosis not present

## 2023-03-26 LAB — GASTROINTESTINAL PANEL BY PCR, STOOL (REPLACES STOOL CULTURE)

## 2023-03-27 ENCOUNTER — Other Ambulatory Visit (HOSPITAL_COMMUNITY): Payer: Self-pay

## 2023-03-27 DIAGNOSIS — I63311 Cerebral infarction due to thrombosis of right middle cerebral artery: Secondary | ICD-10-CM | POA: Diagnosis not present

## 2023-03-28 DIAGNOSIS — I63311 Cerebral infarction due to thrombosis of right middle cerebral artery: Secondary | ICD-10-CM | POA: Diagnosis not present

## 2023-03-29 ENCOUNTER — Other Ambulatory Visit: Payer: Self-pay | Admitting: Nurse Practitioner

## 2023-03-29 DIAGNOSIS — I63311 Cerebral infarction due to thrombosis of right middle cerebral artery: Secondary | ICD-10-CM | POA: Diagnosis not present

## 2023-03-29 DIAGNOSIS — Z139 Encounter for screening, unspecified: Secondary | ICD-10-CM

## 2023-03-30 ENCOUNTER — Other Ambulatory Visit: Payer: Self-pay

## 2023-03-30 ENCOUNTER — Other Ambulatory Visit (HOSPITAL_COMMUNITY): Payer: Self-pay

## 2023-03-30 DIAGNOSIS — I63311 Cerebral infarction due to thrombosis of right middle cerebral artery: Secondary | ICD-10-CM | POA: Diagnosis not present

## 2023-03-31 ENCOUNTER — Other Ambulatory Visit: Payer: Self-pay

## 2023-03-31 ENCOUNTER — Other Ambulatory Visit (HOSPITAL_COMMUNITY): Payer: Self-pay

## 2023-03-31 DIAGNOSIS — I63311 Cerebral infarction due to thrombosis of right middle cerebral artery: Secondary | ICD-10-CM | POA: Diagnosis not present

## 2023-04-01 DIAGNOSIS — I63311 Cerebral infarction due to thrombosis of right middle cerebral artery: Secondary | ICD-10-CM | POA: Diagnosis not present

## 2023-04-02 ENCOUNTER — Encounter: Payer: Self-pay | Admitting: Pediatrics

## 2023-04-02 DIAGNOSIS — I63311 Cerebral infarction due to thrombosis of right middle cerebral artery: Secondary | ICD-10-CM | POA: Diagnosis not present

## 2023-04-03 DIAGNOSIS — I63311 Cerebral infarction due to thrombosis of right middle cerebral artery: Secondary | ICD-10-CM | POA: Diagnosis not present

## 2023-04-04 DIAGNOSIS — I63311 Cerebral infarction due to thrombosis of right middle cerebral artery: Secondary | ICD-10-CM | POA: Diagnosis not present

## 2023-04-05 ENCOUNTER — Other Ambulatory Visit: Payer: Self-pay | Admitting: Licensed Clinical Social Worker

## 2023-04-05 DIAGNOSIS — I63311 Cerebral infarction due to thrombosis of right middle cerebral artery: Secondary | ICD-10-CM | POA: Diagnosis not present

## 2023-04-05 NOTE — Patient Outreach (Signed)
  Medicaid Managed Care   Unsuccessful Attempt Note   04/05/2023 Name: Frank Schneider MRN: [id number] DOB: [birth year]  Referred by: [name], NP Reason for referral : No chief complaint on file.   An unsuccessful telephone outreach was attempted today. The patient was referred to the case management team for assistance with care management and care coordination.    Follow Up Plan: A HIPAA compliant phone message was left for the patient providing contact information and requesting a return call.   Dickie La, BSW, MSW, LCSW Licensed Clinical Social Worker [employer]   [facility] [email or web address] Direct Dial: [phone]

## 2023-04-05 NOTE — Patient Instructions (Signed)
Frank Schneider ,   The [facility] Managed Care Team is available to provide assistance to you with your healthcare needs at no cost and as a benefit of your [facility] plan. I'm sorry I was unable to reach you today for our scheduled appointment. Our care guide will call you to reschedule our telephone appointment. Please call me at the number below. I am available to be of assistance to you regarding your healthcare needs. .   Thank you,  Dickie La, BSW, MSW, LCSW Licensed Clinical Social Worker [employer]   [facility] [email or web address] Direct Dial: [phone]

## 2023-04-06 DIAGNOSIS — I63311 Cerebral infarction due to thrombosis of right middle cerebral artery: Secondary | ICD-10-CM | POA: Diagnosis not present

## 2023-04-07 DIAGNOSIS — I63311 Cerebral infarction due to thrombosis of right middle cerebral artery: Secondary | ICD-10-CM | POA: Diagnosis not present

## 2023-04-08 ENCOUNTER — Ambulatory Visit: Payer: Self-pay | Admitting: Nurse Practitioner

## 2023-04-08 ENCOUNTER — Telehealth: Payer: Self-pay

## 2023-04-08 DIAGNOSIS — I63311 Cerebral infarction due to thrombosis of right middle cerebral artery: Secondary | ICD-10-CM | POA: Diagnosis not present

## 2023-04-08 NOTE — Telephone Encounter (Signed)
Copied from CRM [id number]. Topic: General - Transportation >> Apr 08, 2023  2:19 PM Mosetta Putt H wrote: Reason for CRM: Pt was a no show according to velma.   Not sure where this goes. KH

## 2023-04-09 DIAGNOSIS — I63311 Cerebral infarction due to thrombosis of right middle cerebral artery: Secondary | ICD-10-CM | POA: Diagnosis not present

## 2023-04-10 DIAGNOSIS — I63311 Cerebral infarction due to thrombosis of right middle cerebral artery: Secondary | ICD-10-CM | POA: Diagnosis not present

## 2023-04-11 DIAGNOSIS — I63311 Cerebral infarction due to thrombosis of right middle cerebral artery: Secondary | ICD-10-CM | POA: Diagnosis not present

## 2023-04-12 ENCOUNTER — Other Ambulatory Visit: Payer: Self-pay | Admitting: Licensed Clinical Social Worker

## 2023-04-12 ENCOUNTER — Other Ambulatory Visit: Payer: Self-pay

## 2023-04-12 DIAGNOSIS — I63311 Cerebral infarction due to thrombosis of right middle cerebral artery: Secondary | ICD-10-CM | POA: Diagnosis not present

## 2023-04-12 NOTE — Patient Outreach (Signed)
  Medicaid Managed Care   Unsuccessful Attempt Note   04/12/2023 Name: Frank Schneider MRN: [id number] DOB: 1975/07/22  Referred by: [name], NP Reason for referral : No chief complaint on file.   A second unsuccessful telephone outreach was attempted today. The patient was referred to the case management team for assistance with care management and care coordination.    Follow Up Plan: A HIPAA compliant phone message was left for the patient providing contact information and requesting a return call.   Dickie La, BSW, MSW, LCSW Licensed Clinical Social Worker [employer]   [email or web address] Direct Dial: [phone]

## 2023-04-12 NOTE — Patient Instructions (Signed)
 Frank Schneider ,   The [facility] Managed Care Team is available to provide assistance to you with your healthcare needs at no cost and as a benefit of your [facility] plan. I'm sorry I was unable to reach you today for our scheduled appointment. Our care guide will call you to reschedule our telephone appointment. Please call me at the number below. I am available to be of assistance to you regarding your healthcare needs. .   Thank you,  Dickie La, BSW, MSW, LCSW Licensed Clinical Social Worker [employer]   [facility] [email or web address] Direct Dial: [phone]

## 2023-04-13 DIAGNOSIS — I63311 Cerebral infarction due to thrombosis of right middle cerebral artery: Secondary | ICD-10-CM | POA: Diagnosis not present

## 2023-04-14 ENCOUNTER — Other Ambulatory Visit: Payer: Self-pay

## 2023-04-14 DIAGNOSIS — I63311 Cerebral infarction due to thrombosis of right middle cerebral artery: Secondary | ICD-10-CM | POA: Diagnosis not present

## 2023-04-15 DIAGNOSIS — I63311 Cerebral infarction due to thrombosis of right middle cerebral artery: Secondary | ICD-10-CM | POA: Diagnosis not present

## 2023-04-16 DIAGNOSIS — I63311 Cerebral infarction due to thrombosis of right middle cerebral artery: Secondary | ICD-10-CM | POA: Diagnosis not present

## 2023-04-17 DIAGNOSIS — I63311 Cerebral infarction due to thrombosis of right middle cerebral artery: Secondary | ICD-10-CM | POA: Diagnosis not present

## 2023-04-18 DIAGNOSIS — I63311 Cerebral infarction due to thrombosis of right middle cerebral artery: Secondary | ICD-10-CM | POA: Diagnosis not present

## 2023-04-19 ENCOUNTER — Other Ambulatory Visit: Payer: Self-pay

## 2023-04-19 DIAGNOSIS — I63311 Cerebral infarction due to thrombosis of right middle cerebral artery: Secondary | ICD-10-CM | POA: Diagnosis not present

## 2023-04-19 DIAGNOSIS — E119 Type 2 diabetes mellitus without complications: Secondary | ICD-10-CM | POA: Diagnosis not present

## 2023-04-19 DIAGNOSIS — R3981 Functional urinary incontinence: Secondary | ICD-10-CM | POA: Diagnosis not present

## 2023-04-20 DIAGNOSIS — I63311 Cerebral infarction due to thrombosis of right middle cerebral artery: Secondary | ICD-10-CM | POA: Diagnosis not present

## 2023-04-22 ENCOUNTER — Other Ambulatory Visit: Payer: Self-pay

## 2023-04-22 DIAGNOSIS — E119 Type 2 diabetes mellitus without complications: Secondary | ICD-10-CM | POA: Diagnosis not present

## 2023-04-22 DIAGNOSIS — I63311 Cerebral infarction due to thrombosis of right middle cerebral artery: Secondary | ICD-10-CM | POA: Diagnosis not present

## 2023-04-23 DIAGNOSIS — I63311 Cerebral infarction due to thrombosis of right middle cerebral artery: Secondary | ICD-10-CM | POA: Diagnosis not present

## 2023-04-25 DIAGNOSIS — I63311 Cerebral infarction due to thrombosis of right middle cerebral artery: Secondary | ICD-10-CM | POA: Diagnosis not present

## 2023-04-26 DIAGNOSIS — I63311 Cerebral infarction due to thrombosis of right middle cerebral artery: Secondary | ICD-10-CM | POA: Diagnosis not present

## 2023-04-27 ENCOUNTER — Telehealth: Payer: Self-pay

## 2023-04-27 DIAGNOSIS — I63311 Cerebral infarction due to thrombosis of right middle cerebral artery: Secondary | ICD-10-CM | POA: Diagnosis not present

## 2023-04-27 NOTE — Progress Notes (Signed)
..   Medicaid Managed Care   Unsuccessful Outreach Note  04/27/2023 Name: Frank Schneider MRN: [id number] DOB: 1976-01-16  Referred by: [name], NP Reason for referral : High Risk Managed Medicaid (I called the patient today to get his missed phone appt rescheduled with the MM LCSW. I left my name and number on his VM.)   Third unsuccessful telephone outreach was attempted today. The patient was referred to the case management team for assistance with care management and care coordination. The patient's primary care provider has been notified of our unsuccessful attempts to make or maintain contact with the patient. The care management team is pleased to engage with this patient at any time in the future should he/she be interested in assistance from the care management team.   Follow Up Plan: We have been unable to make contact with the patient for follow up. The care management team is available to follow up with the patient after provider conversation with the patient regarding recommendation for care management engagement and subsequent re-referral to the care management team.   Delon Kleine Care Guide  [facility] Managed  Care Guide [facility]  [phone]

## 2023-04-28 DIAGNOSIS — I63311 Cerebral infarction due to thrombosis of right middle cerebral artery: Secondary | ICD-10-CM | POA: Diagnosis not present

## 2023-04-29 ENCOUNTER — Other Ambulatory Visit: Payer: Self-pay

## 2023-04-29 DIAGNOSIS — I63311 Cerebral infarction due to thrombosis of right middle cerebral artery: Secondary | ICD-10-CM | POA: Diagnosis not present

## 2023-04-30 DIAGNOSIS — I63311 Cerebral infarction due to thrombosis of right middle cerebral artery: Secondary | ICD-10-CM | POA: Diagnosis not present

## 2023-05-01 DIAGNOSIS — I63311 Cerebral infarction due to thrombosis of right middle cerebral artery: Secondary | ICD-10-CM | POA: Diagnosis not present

## 2023-05-02 DIAGNOSIS — I63311 Cerebral infarction due to thrombosis of right middle cerebral artery: Secondary | ICD-10-CM | POA: Diagnosis not present

## 2023-05-03 DIAGNOSIS — I63311 Cerebral infarction due to thrombosis of right middle cerebral artery: Secondary | ICD-10-CM | POA: Diagnosis not present

## 2023-05-04 DIAGNOSIS — I63311 Cerebral infarction due to thrombosis of right middle cerebral artery: Secondary | ICD-10-CM | POA: Diagnosis not present

## 2023-05-05 DIAGNOSIS — I63311 Cerebral infarction due to thrombosis of right middle cerebral artery: Secondary | ICD-10-CM | POA: Diagnosis not present

## 2023-05-06 DIAGNOSIS — I63311 Cerebral infarction due to thrombosis of right middle cerebral artery: Secondary | ICD-10-CM | POA: Diagnosis not present

## 2023-05-07 DIAGNOSIS — I63311 Cerebral infarction due to thrombosis of right middle cerebral artery: Secondary | ICD-10-CM | POA: Diagnosis not present

## 2023-05-09 DIAGNOSIS — I63311 Cerebral infarction due to thrombosis of right middle cerebral artery: Secondary | ICD-10-CM | POA: Diagnosis not present

## 2023-05-10 ENCOUNTER — Other Ambulatory Visit: Payer: Self-pay | Admitting: Nurse Practitioner

## 2023-05-10 ENCOUNTER — Other Ambulatory Visit: Payer: Self-pay

## 2023-05-10 DIAGNOSIS — I10 Essential (primary) hypertension: Secondary | ICD-10-CM

## 2023-05-10 DIAGNOSIS — I63311 Cerebral infarction due to thrombosis of right middle cerebral artery: Secondary | ICD-10-CM | POA: Diagnosis not present

## 2023-05-11 ENCOUNTER — Other Ambulatory Visit: Payer: Self-pay

## 2023-05-11 ENCOUNTER — Other Ambulatory Visit (HOSPITAL_COMMUNITY): Payer: Self-pay

## 2023-05-11 ENCOUNTER — Other Ambulatory Visit: Payer: Self-pay | Admitting: Nurse Practitioner

## 2023-05-11 DIAGNOSIS — Z794 Long term (current) use of insulin: Secondary | ICD-10-CM

## 2023-05-11 DIAGNOSIS — I10 Essential (primary) hypertension: Secondary | ICD-10-CM

## 2023-05-11 DIAGNOSIS — E1159 Type 2 diabetes mellitus with other circulatory complications: Secondary | ICD-10-CM

## 2023-05-11 DIAGNOSIS — I63311 Cerebral infarction due to thrombosis of right middle cerebral artery: Secondary | ICD-10-CM | POA: Diagnosis not present

## 2023-05-11 DIAGNOSIS — G8929 Other chronic pain: Secondary | ICD-10-CM

## 2023-05-11 MED ORDER — IBUPROFEN 800 MG PO TABS: 800.0000 mg | ORAL_TABLET | Freq: Three times a day (TID) | ORAL | 2 refills | Status: DC | PRN

## 2023-05-11 MED ORDER — AMLODIPINE BESYLATE 10 MG PO TABS: 10.0000 mg | ORAL_TABLET | Freq: Every day | ORAL | 1 refills | Status: DC

## 2023-05-11 MED ORDER — LISINOPRIL 20 MG PO TABS
20.0000 mg | ORAL_TABLET | Freq: Every day | ORAL | 1 refills | Status: DC
  Filled 2023-05-11 – 2023-05-18 (×3): qty 30, 30d supply, fill #0
  Filled 2023-06-04 – 2023-06-10 (×2): qty 30, 30d supply, fill #1
  Filled 2023-06-28 – 2023-07-06 (×2): qty 30, 30d supply, fill #2
  Filled 2023-07-15 – 2023-08-03 (×4): qty 30, 30d supply, fill #3
  Filled 2023-08-17 – 2023-09-01 (×3): qty 30, 30d supply, fill #4

## 2023-05-11 MED ORDER — OZEMPIC (0.25 OR 0.5 MG/DOSE) 2 MG/3ML ~~LOC~~ SOPN: PEN_INJECTOR | SUBCUTANEOUS | 2 refills | Status: DC

## 2023-05-11 NOTE — Telephone Encounter (Signed)
 Copied from CRM [id number]. Topic: Clinical - Medication Refill >> May 11, 2023  8:43 AM Renea ORN wrote: Most Recent Primary Care Visit:  Provider: FAYETTE PULLING  Department: [facility]  Visit Type: SOCIAL WORKER  Date: 03/15/2023  Medication: Rx #: [id number] ibuprofen  (ADVIL ) 800 MG tablet... & Ozempic ....    Has the patient contacted their pharmacy? Yes (Agent: If no, request that the patient contact the pharmacy for the refill. If patient does not wish to contact the pharmacy document the reason why and proceed with request.) (Agent: If yes, when and what did the pharmacy advise?)  Is this the correct pharmacy for this prescription? Yes If no, delete pharmacy and type the correct one.  This is the patient's preferred pharmacy:    [facility] - [place], [place] - [address] [place] [address] Phone: [phone] Fax: [phone]   Has the prescription been filled recently? Yes  Is the patient out of the medication? Yes  Has the patient been seen for an appointment in the last year OR does the patient have an upcoming appointment? Yes  Can we respond through MyChart? No  Agent: Please be advised that Rx refills may take up to 3 business days. We ask that you follow-up with your pharmacy.

## 2023-05-12 ENCOUNTER — Other Ambulatory Visit: Payer: Self-pay | Admitting: Nurse Practitioner

## 2023-05-12 ENCOUNTER — Other Ambulatory Visit (HOSPITAL_COMMUNITY): Payer: Self-pay

## 2023-05-12 ENCOUNTER — Other Ambulatory Visit: Payer: Self-pay

## 2023-05-12 DIAGNOSIS — I63311 Cerebral infarction due to thrombosis of right middle cerebral artery: Secondary | ICD-10-CM | POA: Diagnosis not present

## 2023-05-12 DIAGNOSIS — I10 Essential (primary) hypertension: Secondary | ICD-10-CM

## 2023-05-13 ENCOUNTER — Other Ambulatory Visit: Payer: Self-pay

## 2023-05-13 ENCOUNTER — Other Ambulatory Visit (HOSPITAL_COMMUNITY): Payer: Self-pay

## 2023-05-13 DIAGNOSIS — I63311 Cerebral infarction due to thrombosis of right middle cerebral artery: Secondary | ICD-10-CM | POA: Diagnosis not present

## 2023-05-13 MED ORDER — METFORMIN HCL ER 500 MG PO TB24
500.0000 mg | ORAL_TABLET | Freq: Two times a day (BID) | ORAL | 3 refills | Status: DC
  Filled 2023-05-13 – 2023-05-18 (×3): qty 60, 30d supply, fill #0
  Filled 2023-06-04 – 2023-06-10 (×2): qty 60, 30d supply, fill #1
  Filled 2023-06-28 – 2023-07-06 (×2): qty 60, 30d supply, fill #2
  Filled 2023-07-15 – 2023-07-29 (×2): qty 60, 30d supply, fill #3

## 2023-05-13 MED ORDER — ACCU-CHEK SOFTCLIX LANCETS MISC
1 refills | Status: AC
  Filled 2023-05-13: qty 100, 30d supply, fill #0
  Filled 2023-05-13: qty 100, 90d supply, fill #0
  Filled 2023-05-14: qty 100, 50d supply, fill #0

## 2023-05-14 ENCOUNTER — Other Ambulatory Visit (HOSPITAL_COMMUNITY): Payer: Self-pay

## 2023-05-14 ENCOUNTER — Ambulatory Visit: Payer: Self-pay | Admitting: Nurse Practitioner

## 2023-05-14 ENCOUNTER — Other Ambulatory Visit: Payer: Self-pay

## 2023-05-14 ENCOUNTER — Telehealth: Payer: Self-pay | Admitting: Nurse Practitioner

## 2023-05-14 ENCOUNTER — Other Ambulatory Visit: Payer: Self-pay | Admitting: Nurse Practitioner

## 2023-05-14 DIAGNOSIS — E1159 Type 2 diabetes mellitus with other circulatory complications: Secondary | ICD-10-CM

## 2023-05-14 DIAGNOSIS — I63311 Cerebral infarction due to thrombosis of right middle cerebral artery: Secondary | ICD-10-CM | POA: Diagnosis not present

## 2023-05-14 NOTE — Telephone Encounter (Signed)
Copied from CRM [id number]. Topic: Clinical - Prescription Issue >> May 14, 2023  1:51 PM Alphonzo Lemmings O wrote: Reason for CRM: patient is calling concerning his ozempic . And patient tracked the package and it said it arrived 2 days ago . But patient has not received anything and wanting to know what's going on with the ozempic package [id number]

## 2023-05-14 NOTE — Telephone Encounter (Signed)
Copied from CRM [id number]. Topic: Clinical - Prescription Issue >> May 14, 2023  1:51 PM Alphonzo Lemmings O wrote: Reason for CRM: patient is calling concerning his ozempic . And patient tracked the package and it said it arrived 2 days ago . But patient has not received anything and wanting to know what's going on with the ozempic package [phone]  Patient reports that he did not receive his Ozempic medication. He states he was delivered but could not find it. Be believes that because a signature wasn't required he believes someone stole it. Patient is requesting to have his medication sent again. Advised patient I would let his provider know of the situation and told him to call the pharmacy that sent it to him to inform them of the issue as well. Patient understood and agreeable with plan.    Reason for Disposition  [1] Prescription not at pharmacy AND [2] was prescribed by PCP recently (Exception: [facility] has access to EMR and prescription is recorded there. Go to Home Care and confirm for pharmacy.)  Answer Assessment - Initial Assessment Questions 1. NAME of MEDICINE: "What medicine(s) are you calling about?"     Ozempic 2. QUESTION: "What is your question?" (e.g., double dose of medicine, side effect)     Did not receive latest dose, shows delivered but cannot find package 3. PRESCRIBER: "Who prescribed the medicine?" Reason: if prescribed by specialist, call should be referred to that group.     Angus Seller  Protocols used: Medication Question Call-A-AH

## 2023-05-15 ENCOUNTER — Other Ambulatory Visit (HOSPITAL_COMMUNITY): Payer: Self-pay

## 2023-05-15 DIAGNOSIS — I63311 Cerebral infarction due to thrombosis of right middle cerebral artery: Secondary | ICD-10-CM | POA: Diagnosis not present

## 2023-05-16 DIAGNOSIS — I63311 Cerebral infarction due to thrombosis of right middle cerebral artery: Secondary | ICD-10-CM | POA: Diagnosis not present

## 2023-05-17 ENCOUNTER — Other Ambulatory Visit (HOSPITAL_COMMUNITY): Payer: Self-pay

## 2023-05-17 ENCOUNTER — Telehealth: Payer: Self-pay

## 2023-05-17 ENCOUNTER — Other Ambulatory Visit: Payer: Self-pay

## 2023-05-17 ENCOUNTER — Other Ambulatory Visit: Payer: Self-pay | Admitting: Nurse Practitioner

## 2023-05-17 DIAGNOSIS — I10 Essential (primary) hypertension: Secondary | ICD-10-CM

## 2023-05-17 DIAGNOSIS — I63311 Cerebral infarction due to thrombosis of right middle cerebral artery: Secondary | ICD-10-CM | POA: Diagnosis not present

## 2023-05-17 MED ORDER — OZEMPIC (0.25 OR 0.5 MG/DOSE) 2 MG/3ML ~~LOC~~ SOPN
PEN_INJECTOR | SUBCUTANEOUS | 2 refills | Status: DC
  Filled 2023-05-17: qty 3, fill #0

## 2023-05-17 MED ORDER — OZEMPIC (0.25 OR 0.5 MG/DOSE) 2 MG/1.5ML ~~LOC~~ SOPN
PEN_INJECTOR | SUBCUTANEOUS | 2 refills | Status: DC
  Filled 2023-05-17: qty 3, 28d supply, fill #0

## 2023-05-17 MED ORDER — AMLODIPINE BESYLATE 10 MG PO TABS
10.0000 mg | ORAL_TABLET | Freq: Every day | ORAL | 1 refills | Status: DC
  Filled 2023-05-17 – 2023-05-18 (×2): qty 30, 30d supply, fill #0
  Filled 2023-06-04 – 2023-06-10 (×2): qty 30, 30d supply, fill #1
  Filled 2023-06-28 – 2023-07-06 (×2): qty 30, 30d supply, fill #2
  Filled 2023-07-15 – 2023-08-03 (×4): qty 30, 30d supply, fill #3
  Filled 2023-08-17 – 2023-09-01 (×3): qty 30, 30d supply, fill #4
  Filled 2023-09-15 – 2023-09-30 (×2): qty 30, 30d supply, fill #5

## 2023-05-17 NOTE — Telephone Encounter (Unsigned)
Copied from CRM [id number]. Topic: Clinical - Prescription Issue >> May 17, 2023  2:13 PM Desma Mcgregor wrote: Reason for CRM: Patient calling back about status of Ozempic. Informed that it is being reviewed/pending. He stated the pharmacy has told him the doctor has to provide the override and he is asking if the rx is filled again, can a required signature be added to the delivery so that it is not stolen again, due to where he lives. He is also going to try calling his insurance to seek options. Please f/u asap. [phone]

## 2023-05-18 ENCOUNTER — Other Ambulatory Visit: Payer: Self-pay

## 2023-05-18 ENCOUNTER — Other Ambulatory Visit (HOSPITAL_COMMUNITY): Payer: Self-pay

## 2023-05-18 DIAGNOSIS — I63311 Cerebral infarction due to thrombosis of right middle cerebral artery: Secondary | ICD-10-CM | POA: Diagnosis not present

## 2023-05-18 MED ORDER — OZEMPIC (0.25 OR 0.5 MG/DOSE) 2 MG/3ML ~~LOC~~ SOPN
0.5000 mg | PEN_INJECTOR | SUBCUTANEOUS | 2 refills | Status: DC
  Filled 2023-05-18 – 2023-05-19 (×2): qty 3, fill #0
  Filled 2023-05-19: qty 3, 28d supply, fill #0
  Filled 2023-06-04 – 2023-06-10 (×2): qty 3, 28d supply, fill #1
  Filled 2023-07-07: qty 3, 28d supply, fill #2

## 2023-05-18 NOTE — Telephone Encounter (Signed)
Spoke with pharmacy, they note they already spoke with the patient. Generally, he would need to file a police report to be able to get insurance to cover Ozempic refill sooner. They can switch him to pick up and otherwise will fill when they are able.   Will send another prescription with "ok to fill early: on it, and we will see if Medicaid will allow it

## 2023-05-18 NOTE — Telephone Encounter (Signed)
Will message the pharmacy to see what can be done

## 2023-05-18 NOTE — Addendum Note (Signed)
Addended by: Nilda Simmer T on: 05/18/2023 01:25 PM   Modules accepted: Orders

## 2023-05-19 ENCOUNTER — Other Ambulatory Visit: Payer: Self-pay

## 2023-05-19 ENCOUNTER — Other Ambulatory Visit (HOSPITAL_COMMUNITY): Payer: Self-pay

## 2023-05-19 ENCOUNTER — Other Ambulatory Visit (HOSPITAL_BASED_OUTPATIENT_CLINIC_OR_DEPARTMENT_OTHER): Payer: Self-pay

## 2023-05-19 DIAGNOSIS — I63311 Cerebral infarction due to thrombosis of right middle cerebral artery: Secondary | ICD-10-CM | POA: Diagnosis not present

## 2023-05-20 DIAGNOSIS — I63311 Cerebral infarction due to thrombosis of right middle cerebral artery: Secondary | ICD-10-CM | POA: Diagnosis not present

## 2023-05-21 ENCOUNTER — Other Ambulatory Visit: Payer: Self-pay

## 2023-05-21 DIAGNOSIS — I63311 Cerebral infarction due to thrombosis of right middle cerebral artery: Secondary | ICD-10-CM | POA: Diagnosis not present

## 2023-05-23 DIAGNOSIS — I63311 Cerebral infarction due to thrombosis of right middle cerebral artery: Secondary | ICD-10-CM | POA: Diagnosis not present

## 2023-05-24 DIAGNOSIS — R3981 Functional urinary incontinence: Secondary | ICD-10-CM | POA: Diagnosis not present

## 2023-05-24 DIAGNOSIS — E119 Type 2 diabetes mellitus without complications: Secondary | ICD-10-CM | POA: Diagnosis not present

## 2023-05-27 DIAGNOSIS — I63311 Cerebral infarction due to thrombosis of right middle cerebral artery: Secondary | ICD-10-CM | POA: Diagnosis not present

## 2023-05-28 DIAGNOSIS — I63311 Cerebral infarction due to thrombosis of right middle cerebral artery: Secondary | ICD-10-CM | POA: Diagnosis not present

## 2023-05-29 DIAGNOSIS — I63311 Cerebral infarction due to thrombosis of right middle cerebral artery: Secondary | ICD-10-CM | POA: Diagnosis not present

## 2023-05-30 DIAGNOSIS — I63311 Cerebral infarction due to thrombosis of right middle cerebral artery: Secondary | ICD-10-CM | POA: Diagnosis not present

## 2023-05-31 DIAGNOSIS — I63311 Cerebral infarction due to thrombosis of right middle cerebral artery: Secondary | ICD-10-CM | POA: Diagnosis not present

## 2023-06-01 DIAGNOSIS — I63311 Cerebral infarction due to thrombosis of right middle cerebral artery: Secondary | ICD-10-CM | POA: Diagnosis not present

## 2023-06-02 DIAGNOSIS — I63311 Cerebral infarction due to thrombosis of right middle cerebral artery: Secondary | ICD-10-CM | POA: Diagnosis not present

## 2023-06-03 ENCOUNTER — Encounter: Payer: Self-pay | Admitting: Nurse Practitioner

## 2023-06-03 DIAGNOSIS — I63311 Cerebral infarction due to thrombosis of right middle cerebral artery: Secondary | ICD-10-CM | POA: Diagnosis not present

## 2023-06-04 ENCOUNTER — Other Ambulatory Visit: Payer: Self-pay

## 2023-06-04 DIAGNOSIS — I63311 Cerebral infarction due to thrombosis of right middle cerebral artery: Secondary | ICD-10-CM | POA: Diagnosis not present

## 2023-06-05 DIAGNOSIS — I63311 Cerebral infarction due to thrombosis of right middle cerebral artery: Secondary | ICD-10-CM | POA: Diagnosis not present

## 2023-06-06 DIAGNOSIS — I63311 Cerebral infarction due to thrombosis of right middle cerebral artery: Secondary | ICD-10-CM | POA: Diagnosis not present

## 2023-06-07 DIAGNOSIS — I63311 Cerebral infarction due to thrombosis of right middle cerebral artery: Secondary | ICD-10-CM | POA: Diagnosis not present

## 2023-06-08 DIAGNOSIS — I63311 Cerebral infarction due to thrombosis of right middle cerebral artery: Secondary | ICD-10-CM | POA: Diagnosis not present

## 2023-06-09 DIAGNOSIS — I63311 Cerebral infarction due to thrombosis of right middle cerebral artery: Secondary | ICD-10-CM | POA: Diagnosis not present

## 2023-06-09 NOTE — Progress Notes (Deleted)
 [facility] Initial Consultation   Referring Provider [name], NP [address] [place],  [place] [address]  Primary Care Provider [name], NP  Patient Profile: Frank Schneider is a 48 y.o. male who is seen in consultation in the [facility] at the request of Dr. Tanda Rockers for evaluation and management of the problem(s) noted below.  Problem List: Diarrhea Colon cancer screening -repeat Cologuard requested 10/2022  History of Present Illness   Frank Schneider is a 48 y.o. male with a history of T2DM, HTN, HLD, CVA (on ASA), diabetic neuropathy   Last colonoscopy: *** Last endoscopy: ***  Last Abd CT/CTE/MRE: ***  GI Review of Symptoms Significant for {GIROS:50592}. Otherwise negative.  General Review of Systems  Review of systems is significant for the pertinent positives and negatives as listed per the HPI.  Full ROS is otherwise negative.  Past Medical History   Past Medical History:  Diagnosis Date   Anticoagulated    ASA 325 mg--- ;managed by pcp  (stroke prevention)   Beta thalassemia trait    hematology/ oncologist--- dr Mosetta Putt (lov in epic 11-11-2022  elevated A2   Chronic foot pain    Diabetic neuropathy (HCC)    DOE (dyspnea on exertion)    02-23-2023  per pt sob w/ stairs , yard work, and ok with household chores,  does not do any time of walking or exercise   History of cerebrovascular accident (CVA) with residual deficit 12/31/2014   admission in epic;   right thalamic infarct second SVD residual LUE / LLE numb/ ting with sensory deficit   History of eye trauma    02-23-2023  per pt eye injury age late 70s , never seek medical attention,  vision became progressive worse over time;  then pt had stroke 01/ 2020 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,  s/p eye removal 02/ 2024   History of ischemic stroke 05/09/2015   admission in epic;   acute ischemia right ventral pons,  pt was  taking his plavix   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 Chestine Spore;  taking pletal twice daily   Leucocytosis    Numbness and tingling of left arm and leg    stroke residual,  sensory deficit resolve   Type 2 diabetes mellitus (HCC)    followed by pcp;   (02-23-2023  per pt has Libre 3,  blood sugar check multiple times daily,  stated average fasting sugar 120s,  stopped doing lantus daily approx 09/ 2024,  only taking jardiance daily and weely ozempic)   Vision abnormalities    Vitamin D deficiency 12/2019   Wears glasses      Past Surgical History   Past Surgical History:  Procedure Laterality Date   CATARACT EXTRACTION W/ INTRAOCULAR LENS IMPLANT Right 05/27/2016   EVISCERATION Right 06/11/2022   Procedure: EVISCERATION REPAIR WITH POSSIBLE INCLUSION;  Surgeon: Dairl Ponder, MD;  Location: [facility] OR;  Service: Ophthalmology;  Laterality: Right;   INCISION AND DRAINAGE PERIRECTAL ABSCESS  07/28/2011   Procedure: IRRIGATION AND DEBRIDEMENT PERIRECTAL ABSCESS;  Surgeon: Ernestene Mention, MD;  Location: [facility] ORS;  Service: General;  Laterality: N/A;   of skin muscle and subcutaneous tissue of perimeum  8cmx12cm area    IR GENERIC HISTORICAL  01/08/2016   IR RADIOLOGIST EVAL & MGMT 01/08/2016 [facility] INTERV RAD   LAPAROSCOPIC APPENDECTOMY N/A 12/16/2015   Procedure: APPENDECTOMY LAPAROSCOPIC;  Surgeon: Abigail Miyamoto, MD;  Location: [facility]  ORS;  Service: General;  Laterality: N/A;   PRIAPISM REPAIR     age 3s   (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)     Allergies and Medications  No Known Allergies  @MEDSTODAY @  Family History   Family History  Problem Relation Age of Onset   Cancer Mother        throat cancer   Diabetes Mother    Hypertension Mother    Diabetes Maternal Grandmother    Hypertension Maternal Grandmother    Depression Maternal Grandmother      Social History   Social History   Tobacco Use   Smoking status: Every Day    Types: Cigarettes   Smokeless tobacco: Never   Tobacco comments:    02-23-2023  per pt currently 5 cig per day,  started age 41 (24),  smoked for 39yrs  Vaping Use   Vaping status: Never Used  Substance Use Topics   Alcohol use: Yes    Comment: rare   Drug use: Yes    Types: Marijuana    Comment: 02-23-2023  occasional smokes marijuana   Frank Schneider reports that he has been smoking cigarettes. He has never used smokeless tobacco. He reports current alcohol use. He reports current drug use. Drug: Marijuana.  Vital Signs and Physical Examination  There were no vitals filed for this visit. There is no height or weight on file to calculate BMI.    General: Well developed, well nourished, no acute distress Head: Normocephalic and atraumatic Eyes: Sclerae anicteric, EOMI Ears: Normal auditory acuity Mouth: No deformities or lesions noted Lungs: Clear throughout to auscultation Heart: Regular rate and rhythm; No murmurs, rubs or bruits Abdomen: Soft, non tender and non distended. No masses, hepatosplenomegaly or hernias noted. Normal Bowel sounds Rectal: Musculoskeletal: Symmetrical with no gross deformities  Pulses:  Normal pulses noted Extremities: No edema or deformities noted Neurological: Alert oriented x 4, grossly nonfocal Psychological:  Alert and cooperative. Normal mood and affect  Review of Data  The following data  was reviewed at the time of this encounter:  Laboratory Studies      Latest Ref Rng & Units 03/24/2023    9:00 AM 11/11/2022    3:36 PM 10/07/2022    3:08 PM  CBC  WBC 4.0 - 10.5 K/uL 23.6  10.6  13.9   Hemoglobin 13.0 - 17.0 g/dL 29.5  62.1  30.8   Hematocrit 39.0 - 52.0 % 45.8  41.6  43.5   Platelets 150 - 400 K/uL 209  203  221     Lab Results  Component Value Date   LIPASE 42 03/24/2023      Latest Ref Rng & Units 03/24/2023    9:00 AM 10/07/2022    3:08 PM 06/22/2022    2:40 PM  CMP  Glucose 70 - 99 mg/dL 657  846  962   BUN 6 -  20 mg/dL 17  11  8    Creatinine 0.61 - 1.24 mg/dL 3.32  9.51  8.84   Sodium 135 - 145 mmol/L 139  143  141   Potassium 3.5 - 5.1 mmol/L 3.7  4.2  4.0   Chloride 98 - 111 mmol/L 104  103  101   CO2 22 - 32 mmol/L 25  26  25    Calcium 8.9 - 10.3 mg/dL 9.5  8.8  9.4   Total Protein 6.5 - 8.1 g/dL 8.0  6.7  6.7   Total Bilirubin <1.2 mg/dL 0.7  0.4  0.4   Alkaline Phos 38 - 126 U/L 81  70  108   AST 15 - 41 U/L 31  20  11    ALT 0 - 44 U/L 29  19  15       Imaging Studies    GI Procedures and Studies      Clinical Impression  It is my clinical impression that Frank Schneider is a 48 y.o. male with;  ***  Plan  *** *** *** *** ***  Planned Follow Up No follow-ups on file.  The patient or caregiver verbalized understanding of the material covered, with no barriers to understanding. All questions were answered. Patient or caregiver is agreeable with the plan outlined above.    It was a pleasure to see Frank Schneider.  If you have any questions or concerns regarding this evaluation, do not hesitate to contact me.  Maren Beach, MD [facility]

## 2023-06-10 ENCOUNTER — Other Ambulatory Visit (HOSPITAL_COMMUNITY): Payer: Self-pay

## 2023-06-10 ENCOUNTER — Ambulatory Visit: Payer: Medicaid Other | Admitting: Pediatrics

## 2023-06-10 ENCOUNTER — Other Ambulatory Visit: Payer: Self-pay

## 2023-06-10 DIAGNOSIS — I63311 Cerebral infarction due to thrombosis of right middle cerebral artery: Secondary | ICD-10-CM | POA: Diagnosis not present

## 2023-06-10 DIAGNOSIS — R197 Diarrhea, unspecified: Secondary | ICD-10-CM

## 2023-06-11 ENCOUNTER — Other Ambulatory Visit: Payer: Self-pay

## 2023-06-11 ENCOUNTER — Other Ambulatory Visit (HOSPITAL_COMMUNITY): Payer: Self-pay

## 2023-06-11 DIAGNOSIS — I63311 Cerebral infarction due to thrombosis of right middle cerebral artery: Secondary | ICD-10-CM | POA: Diagnosis not present

## 2023-06-12 DIAGNOSIS — I63311 Cerebral infarction due to thrombosis of right middle cerebral artery: Secondary | ICD-10-CM | POA: Diagnosis not present

## 2023-06-13 DIAGNOSIS — I63311 Cerebral infarction due to thrombosis of right middle cerebral artery: Secondary | ICD-10-CM | POA: Diagnosis not present

## 2023-06-14 ENCOUNTER — Other Ambulatory Visit: Payer: Self-pay

## 2023-06-14 ENCOUNTER — Other Ambulatory Visit (HOSPITAL_COMMUNITY): Payer: Self-pay

## 2023-06-14 DIAGNOSIS — I63311 Cerebral infarction due to thrombosis of right middle cerebral artery: Secondary | ICD-10-CM | POA: Diagnosis not present

## 2023-06-15 DIAGNOSIS — I63311 Cerebral infarction due to thrombosis of right middle cerebral artery: Secondary | ICD-10-CM | POA: Diagnosis not present

## 2023-06-16 DIAGNOSIS — I63311 Cerebral infarction due to thrombosis of right middle cerebral artery: Secondary | ICD-10-CM | POA: Diagnosis not present

## 2023-06-18 ENCOUNTER — Other Ambulatory Visit (HOSPITAL_COMMUNITY): Payer: Self-pay

## 2023-06-18 DIAGNOSIS — I63311 Cerebral infarction due to thrombosis of right middle cerebral artery: Secondary | ICD-10-CM | POA: Diagnosis not present

## 2023-06-19 DIAGNOSIS — I63311 Cerebral infarction due to thrombosis of right middle cerebral artery: Secondary | ICD-10-CM | POA: Diagnosis not present

## 2023-06-20 DIAGNOSIS — I63311 Cerebral infarction due to thrombosis of right middle cerebral artery: Secondary | ICD-10-CM | POA: Diagnosis not present

## 2023-06-21 DIAGNOSIS — I63311 Cerebral infarction due to thrombosis of right middle cerebral artery: Secondary | ICD-10-CM | POA: Diagnosis not present

## 2023-06-22 DIAGNOSIS — I63311 Cerebral infarction due to thrombosis of right middle cerebral artery: Secondary | ICD-10-CM | POA: Diagnosis not present

## 2023-06-23 DIAGNOSIS — R3981 Functional urinary incontinence: Secondary | ICD-10-CM | POA: Diagnosis not present

## 2023-06-23 DIAGNOSIS — E119 Type 2 diabetes mellitus without complications: Secondary | ICD-10-CM | POA: Diagnosis not present

## 2023-06-23 DIAGNOSIS — I63311 Cerebral infarction due to thrombosis of right middle cerebral artery: Secondary | ICD-10-CM | POA: Diagnosis not present

## 2023-06-24 DIAGNOSIS — I63311 Cerebral infarction due to thrombosis of right middle cerebral artery: Secondary | ICD-10-CM | POA: Diagnosis not present

## 2023-06-25 DIAGNOSIS — I63311 Cerebral infarction due to thrombosis of right middle cerebral artery: Secondary | ICD-10-CM | POA: Diagnosis not present

## 2023-06-26 DIAGNOSIS — I63311 Cerebral infarction due to thrombosis of right middle cerebral artery: Secondary | ICD-10-CM | POA: Diagnosis not present

## 2023-06-27 DIAGNOSIS — I63311 Cerebral infarction due to thrombosis of right middle cerebral artery: Secondary | ICD-10-CM | POA: Diagnosis not present

## 2023-06-28 ENCOUNTER — Other Ambulatory Visit: Payer: Self-pay | Admitting: Nurse Practitioner

## 2023-06-28 ENCOUNTER — Other Ambulatory Visit (HOSPITAL_BASED_OUTPATIENT_CLINIC_OR_DEPARTMENT_OTHER): Payer: Self-pay

## 2023-06-28 ENCOUNTER — Other Ambulatory Visit: Payer: Self-pay

## 2023-06-28 DIAGNOSIS — I10 Essential (primary) hypertension: Secondary | ICD-10-CM

## 2023-06-28 DIAGNOSIS — E785 Hyperlipidemia, unspecified: Secondary | ICD-10-CM

## 2023-06-28 DIAGNOSIS — I63311 Cerebral infarction due to thrombosis of right middle cerebral artery: Secondary | ICD-10-CM | POA: Diagnosis not present

## 2023-06-28 MED ORDER — ASPIRIN 325 MG PO TABS
325.0000 mg | ORAL_TABLET | Freq: Every day | ORAL | 1 refills | Status: DC
  Filled 2023-07-06: qty 30, 30d supply, fill #0
  Filled 2023-07-15 – 2023-08-03 (×4): qty 30, 30d supply, fill #1
  Filled 2023-08-17 – 2023-09-01 (×3): qty 30, 30d supply, fill #2
  Filled 2023-09-15 – 2023-09-30 (×2): qty 30, 30d supply, fill #3
  Filled 2023-10-25 – 2023-11-02 (×2): qty 30, 30d supply, fill #4
  Filled 2023-11-30 – 2023-12-02 (×2): qty 30, 30d supply, fill #5

## 2023-06-28 MED ORDER — ATORVASTATIN CALCIUM 40 MG PO TABS
40.0000 mg | ORAL_TABLET | Freq: Every day | ORAL | 1 refills | Status: DC
  Filled 2023-07-06: qty 30, 30d supply, fill #0
  Filled 2023-07-15 – 2023-08-03 (×4): qty 30, 30d supply, fill #1
  Filled 2023-08-17 – 2023-09-01 (×3): qty 30, 30d supply, fill #2
  Filled 2023-09-15 – 2023-09-30 (×2): qty 30, 30d supply, fill #3
  Filled 2023-10-25 – 2023-11-02 (×2): qty 30, 30d supply, fill #4
  Filled 2023-11-30 – 2023-12-02 (×2): qty 30, 30d supply, fill #5

## 2023-06-29 DIAGNOSIS — I63311 Cerebral infarction due to thrombosis of right middle cerebral artery: Secondary | ICD-10-CM | POA: Diagnosis not present

## 2023-06-30 DIAGNOSIS — I63311 Cerebral infarction due to thrombosis of right middle cerebral artery: Secondary | ICD-10-CM | POA: Diagnosis not present

## 2023-07-01 DIAGNOSIS — I63311 Cerebral infarction due to thrombosis of right middle cerebral artery: Secondary | ICD-10-CM | POA: Diagnosis not present

## 2023-07-02 DIAGNOSIS — I63311 Cerebral infarction due to thrombosis of right middle cerebral artery: Secondary | ICD-10-CM | POA: Diagnosis not present

## 2023-07-03 DIAGNOSIS — I63311 Cerebral infarction due to thrombosis of right middle cerebral artery: Secondary | ICD-10-CM | POA: Diagnosis not present

## 2023-07-04 DIAGNOSIS — I63311 Cerebral infarction due to thrombosis of right middle cerebral artery: Secondary | ICD-10-CM | POA: Diagnosis not present

## 2023-07-05 DIAGNOSIS — I63311 Cerebral infarction due to thrombosis of right middle cerebral artery: Secondary | ICD-10-CM | POA: Diagnosis not present

## 2023-07-05 DIAGNOSIS — H5213 Myopia, bilateral: Secondary | ICD-10-CM | POA: Diagnosis not present

## 2023-07-06 ENCOUNTER — Other Ambulatory Visit: Payer: Self-pay

## 2023-07-06 DIAGNOSIS — I63311 Cerebral infarction due to thrombosis of right middle cerebral artery: Secondary | ICD-10-CM | POA: Diagnosis not present

## 2023-07-07 ENCOUNTER — Other Ambulatory Visit: Payer: Self-pay

## 2023-07-07 DIAGNOSIS — I63311 Cerebral infarction due to thrombosis of right middle cerebral artery: Secondary | ICD-10-CM | POA: Diagnosis not present

## 2023-07-08 DIAGNOSIS — I63311 Cerebral infarction due to thrombosis of right middle cerebral artery: Secondary | ICD-10-CM | POA: Diagnosis not present

## 2023-07-09 ENCOUNTER — Telehealth: Payer: Self-pay

## 2023-07-09 ENCOUNTER — Other Ambulatory Visit: Payer: Self-pay

## 2023-07-09 DIAGNOSIS — I63311 Cerebral infarction due to thrombosis of right middle cerebral artery: Secondary | ICD-10-CM | POA: Diagnosis not present

## 2023-07-09 NOTE — Telephone Encounter (Signed)
 Copied from CRM [id number]. Topic: General - Call Back - No Documentation >> Jul 09, 2023 12:57 PM DeAngela L wrote: Reason for CRM: Patient received information from the home health aid that the home healthcare he is receiving will expire 07/16/2023, and the Patient needs to get recertified to continue care with the home health company, Patient would like a call back to find out what he needs to do to keep home healthcare, he states the home health aid is really helping him in with his daily life style

## 2023-07-10 DIAGNOSIS — I63311 Cerebral infarction due to thrombosis of right middle cerebral artery: Secondary | ICD-10-CM | POA: Diagnosis not present

## 2023-07-11 DIAGNOSIS — I63311 Cerebral infarction due to thrombosis of right middle cerebral artery: Secondary | ICD-10-CM | POA: Diagnosis not present

## 2023-07-12 ENCOUNTER — Telehealth: Payer: Self-pay

## 2023-07-12 DIAGNOSIS — I63311 Cerebral infarction due to thrombosis of right middle cerebral artery: Secondary | ICD-10-CM | POA: Diagnosis not present

## 2023-07-12 NOTE — Telephone Encounter (Signed)
 Copied from CRM [id number]. Topic: General - Other >> Jul 12, 2023  9:16 AM Izetta Dakin wrote: Reason for CRM: Patient requesting information to re certify for his home health services. Patient requesting a callback at [phone].  Called pt [place] [address]. At [phone] to see if this is something she can assist with. LVM for call back. KH

## 2023-07-13 DIAGNOSIS — I63311 Cerebral infarction due to thrombosis of right middle cerebral artery: Secondary | ICD-10-CM | POA: Diagnosis not present

## 2023-07-14 DIAGNOSIS — I63311 Cerebral infarction due to thrombosis of right middle cerebral artery: Secondary | ICD-10-CM | POA: Diagnosis not present

## 2023-07-15 ENCOUNTER — Other Ambulatory Visit: Payer: Self-pay | Admitting: Nurse Practitioner

## 2023-07-15 ENCOUNTER — Other Ambulatory Visit: Payer: Self-pay

## 2023-07-15 DIAGNOSIS — I63311 Cerebral infarction due to thrombosis of right middle cerebral artery: Secondary | ICD-10-CM | POA: Diagnosis not present

## 2023-07-16 ENCOUNTER — Other Ambulatory Visit (HOSPITAL_COMMUNITY): Payer: Self-pay

## 2023-07-16 ENCOUNTER — Other Ambulatory Visit: Payer: Self-pay

## 2023-07-16 ENCOUNTER — Telehealth: Payer: Self-pay

## 2023-07-16 MED ORDER — EMPAGLIFLOZIN 25 MG PO TABS
25.0000 mg | ORAL_TABLET | Freq: Every day | ORAL | 2 refills | Status: DC
Start: 2023-07-16 — End: 2023-09-03
  Filled 2023-07-29: qty 90, 90d supply, fill #0
  Filled 2023-07-29: qty 30, 30d supply, fill #0
  Filled 2023-07-30: qty 90, 90d supply, fill #0
  Filled 2023-08-02 – 2023-08-03 (×2): qty 30, 30d supply, fill #0
  Filled 2023-08-17 – 2023-09-01 (×3): qty 30, 30d supply, fill #1

## 2023-07-16 NOTE — Telephone Encounter (Signed)
 Copied from CRM [id number]. Topic: General - Other >> Jul 15, 2023  4:02 PM Almira Coaster wrote: Reason for CRM: Patient was calling to schedule an appointment with Angus Seller due to his home health aid expiring tomorrow, scheduled appointment for next available on 08/27/2023 at 9:40 am. Advised that there were messages that the renewal for home health was being worked on.

## 2023-07-16 NOTE — Telephone Encounter (Signed)
 Copied from CRM [id number]. Topic: General - Other >> Jul 16, 2023 10:17 AM Fredrich Romans wrote: Reason for CRM: Patient called in asking has the office been able to reach [facility] where his aide was coming from or his insurance [employer] to try and continue his services? He would like an update with any information.

## 2023-07-23 DIAGNOSIS — R3981 Functional urinary incontinence: Secondary | ICD-10-CM | POA: Diagnosis not present

## 2023-07-23 DIAGNOSIS — E119 Type 2 diabetes mellitus without complications: Secondary | ICD-10-CM | POA: Diagnosis not present

## 2023-07-29 ENCOUNTER — Other Ambulatory Visit (HOSPITAL_COMMUNITY): Payer: Self-pay

## 2023-07-29 ENCOUNTER — Other Ambulatory Visit: Payer: Self-pay

## 2023-07-29 ENCOUNTER — Other Ambulatory Visit: Payer: Self-pay | Admitting: Nurse Practitioner

## 2023-07-29 MED FILL — Metformin HCl Tab ER 24HR 500 MG: ORAL | 30 days supply | Qty: 60 | Fill #0 | Status: CN

## 2023-07-30 ENCOUNTER — Other Ambulatory Visit (HOSPITAL_COMMUNITY): Payer: Self-pay

## 2023-07-30 ENCOUNTER — Other Ambulatory Visit: Payer: Self-pay

## 2023-07-30 ENCOUNTER — Ambulatory Visit: Payer: Self-pay | Admitting: Nurse Practitioner

## 2023-08-02 ENCOUNTER — Other Ambulatory Visit (HOSPITAL_COMMUNITY): Payer: Self-pay

## 2023-08-02 ENCOUNTER — Other Ambulatory Visit: Payer: Self-pay

## 2023-08-02 MED FILL — Metformin HCl Tab ER 24HR 500 MG: ORAL | 30 days supply | Qty: 60 | Fill #0 | Status: CN

## 2023-08-03 ENCOUNTER — Other Ambulatory Visit: Payer: Self-pay

## 2023-08-03 MED FILL — Metformin HCl Tab ER 24HR 500 MG: ORAL | 30 days supply | Qty: 60 | Fill #0 | Status: AC

## 2023-08-04 ENCOUNTER — Other Ambulatory Visit: Payer: Self-pay

## 2023-08-04 ENCOUNTER — Other Ambulatory Visit (HOSPITAL_COMMUNITY): Payer: Self-pay

## 2023-08-05 ENCOUNTER — Other Ambulatory Visit: Payer: Self-pay

## 2023-08-06 ENCOUNTER — Other Ambulatory Visit: Payer: Self-pay

## 2023-08-10 ENCOUNTER — Telehealth: Payer: Self-pay | Admitting: Nurse Practitioner

## 2023-08-10 NOTE — Telephone Encounter (Signed)
 Left vm for him to return call bc there is no appt for 08/16/2023 . There is an appt. For 08/31/2023 at [facility].

## 2023-08-17 ENCOUNTER — Other Ambulatory Visit: Payer: Self-pay

## 2023-08-17 MED FILL — Metformin HCl Tab ER 24HR 500 MG: ORAL | 30 days supply | Qty: 60 | Fill #1 | Status: CN

## 2023-08-18 ENCOUNTER — Other Ambulatory Visit: Payer: Self-pay

## 2023-08-22 DIAGNOSIS — E119 Type 2 diabetes mellitus without complications: Secondary | ICD-10-CM | POA: Diagnosis not present

## 2023-08-22 DIAGNOSIS — R3981 Functional urinary incontinence: Secondary | ICD-10-CM | POA: Diagnosis not present

## 2023-08-24 ENCOUNTER — Telehealth: Payer: Self-pay | Admitting: Nurse Practitioner

## 2023-08-24 NOTE — Telephone Encounter (Signed)
 Copied from CRM [id number]. Topic: General - Other >> Aug 24, 2023 11:59 AM Elle L wrote: Reason for CRM: The patient needs paperwork filled out for [facility]. It is for re-certification for his disability and he is requesting to see if the office can fill it out for him. The patient's call back number is [phone].

## 2023-08-27 ENCOUNTER — Ambulatory Visit: Payer: Self-pay | Admitting: Nurse Practitioner

## 2023-08-29 ENCOUNTER — Other Ambulatory Visit: Payer: Self-pay

## 2023-08-29 ENCOUNTER — Encounter (HOSPITAL_COMMUNITY): Payer: Self-pay | Admitting: Emergency Medicine

## 2023-08-29 ENCOUNTER — Emergency Department (HOSPITAL_COMMUNITY)

## 2023-08-29 ENCOUNTER — Inpatient Hospital Stay (HOSPITAL_COMMUNITY)
Admission: EM | Admit: 2023-08-29 | Discharge: 2023-09-03 | DRG: 074 | Disposition: A | Discharge status: Home Health | Attending: Internal Medicine | Admitting: Internal Medicine

## 2023-08-29 DIAGNOSIS — E876 Hypokalemia: Secondary | ICD-10-CM | POA: Diagnosis present

## 2023-08-29 DIAGNOSIS — F32A Depression, unspecified: Secondary | ICD-10-CM | POA: Diagnosis not present

## 2023-08-29 DIAGNOSIS — E1151 Type 2 diabetes mellitus with diabetic peripheral angiopathy without gangrene: Secondary | ICD-10-CM | POA: Diagnosis present

## 2023-08-29 DIAGNOSIS — D72829 Elevated white blood cell count, unspecified: Secondary | ICD-10-CM | POA: Diagnosis present

## 2023-08-29 DIAGNOSIS — Z8249 Family history of ischemic heart disease and other diseases of the circulatory system: Secondary | ICD-10-CM

## 2023-08-29 DIAGNOSIS — E1143 Type 2 diabetes mellitus with diabetic autonomic (poly)neuropathy: Secondary | ICD-10-CM | POA: Diagnosis present

## 2023-08-29 DIAGNOSIS — E1165 Type 2 diabetes mellitus with hyperglycemia: Secondary | ICD-10-CM | POA: Diagnosis present

## 2023-08-29 DIAGNOSIS — Z961 Presence of intraocular lens: Secondary | ICD-10-CM | POA: Diagnosis present

## 2023-08-29 DIAGNOSIS — F17210 Nicotine dependence, cigarettes, uncomplicated: Secondary | ICD-10-CM | POA: Diagnosis present

## 2023-08-29 DIAGNOSIS — F419 Anxiety disorder, unspecified: Secondary | ICD-10-CM | POA: Diagnosis present

## 2023-08-29 DIAGNOSIS — R1111 Vomiting without nausea: Secondary | ICD-10-CM | POA: Diagnosis not present

## 2023-08-29 DIAGNOSIS — T50995A Adverse effect of other drugs, medicaments and biological substances, initial encounter: Secondary | ICD-10-CM | POA: Diagnosis present

## 2023-08-29 DIAGNOSIS — Z7984 Long term (current) use of oral hypoglycemic drugs: Secondary | ICD-10-CM

## 2023-08-29 DIAGNOSIS — Z716 Tobacco abuse counseling: Secondary | ICD-10-CM

## 2023-08-29 DIAGNOSIS — R1110 Vomiting, unspecified: Secondary | ICD-10-CM | POA: Diagnosis not present

## 2023-08-29 DIAGNOSIS — I152 Hypertension secondary to endocrine disorders: Secondary | ICD-10-CM

## 2023-08-29 DIAGNOSIS — Z8673 Personal history of transient ischemic attack (TIA), and cerebral infarction without residual deficits: Secondary | ICD-10-CM

## 2023-08-29 DIAGNOSIS — Z833 Family history of diabetes mellitus: Secondary | ICD-10-CM | POA: Diagnosis not present

## 2023-08-29 DIAGNOSIS — I70211 Atherosclerosis of native arteries of extremities with intermittent claudication, right leg: Secondary | ICD-10-CM | POA: Diagnosis present

## 2023-08-29 DIAGNOSIS — I10 Essential (primary) hypertension: Secondary | ICD-10-CM | POA: Diagnosis not present

## 2023-08-29 DIAGNOSIS — R1310 Dysphagia, unspecified: Secondary | ICD-10-CM | POA: Diagnosis present

## 2023-08-29 DIAGNOSIS — I160 Hypertensive urgency: Secondary | ICD-10-CM | POA: Diagnosis present

## 2023-08-29 DIAGNOSIS — I69354 Hemiplegia and hemiparesis following cerebral infarction affecting left non-dominant side: Secondary | ICD-10-CM | POA: Diagnosis not present

## 2023-08-29 DIAGNOSIS — K3184 Gastroparesis: Secondary | ICD-10-CM | POA: Diagnosis present

## 2023-08-29 DIAGNOSIS — Z7985 Long-term (current) use of injectable non-insulin antidiabetic drugs: Secondary | ICD-10-CM

## 2023-08-29 DIAGNOSIS — H5440 Blindness, one eye, unspecified eye: Secondary | ICD-10-CM | POA: Diagnosis present

## 2023-08-29 DIAGNOSIS — K529 Noninfective gastroenteritis and colitis, unspecified: Secondary | ICD-10-CM | POA: Diagnosis present

## 2023-08-29 DIAGNOSIS — R109 Unspecified abdominal pain: Secondary | ICD-10-CM | POA: Diagnosis present

## 2023-08-29 DIAGNOSIS — E559 Vitamin D deficiency, unspecified: Secondary | ICD-10-CM | POA: Diagnosis present

## 2023-08-29 DIAGNOSIS — F1490 Cocaine use, unspecified, uncomplicated: Secondary | ICD-10-CM | POA: Diagnosis present

## 2023-08-29 DIAGNOSIS — Z720 Tobacco use: Secondary | ICD-10-CM | POA: Diagnosis present

## 2023-08-29 DIAGNOSIS — H5461 Unqualified visual loss, right eye, normal vision left eye: Secondary | ICD-10-CM | POA: Diagnosis present

## 2023-08-29 DIAGNOSIS — R739 Hyperglycemia, unspecified: Secondary | ICD-10-CM | POA: Diagnosis not present

## 2023-08-29 DIAGNOSIS — K2970 Gastritis, unspecified, without bleeding: Secondary | ICD-10-CM | POA: Diagnosis present

## 2023-08-29 DIAGNOSIS — R112 Nausea with vomiting, unspecified: Secondary | ICD-10-CM | POA: Diagnosis not present

## 2023-08-29 DIAGNOSIS — Z794 Long term (current) use of insulin: Secondary | ICD-10-CM

## 2023-08-29 DIAGNOSIS — Z79899 Other long term (current) drug therapy: Secondary | ICD-10-CM

## 2023-08-29 DIAGNOSIS — E785 Hyperlipidemia, unspecified: Secondary | ICD-10-CM | POA: Diagnosis present

## 2023-08-29 DIAGNOSIS — E1159 Type 2 diabetes mellitus with other circulatory complications: Secondary | ICD-10-CM | POA: Diagnosis not present

## 2023-08-29 DIAGNOSIS — K920 Hematemesis: Secondary | ICD-10-CM | POA: Diagnosis not present

## 2023-08-29 DIAGNOSIS — I70219 Atherosclerosis of native arteries of extremities with intermittent claudication, unspecified extremity: Secondary | ICD-10-CM | POA: Diagnosis present

## 2023-08-29 DIAGNOSIS — Z818 Family history of other mental and behavioral disorders: Secondary | ICD-10-CM

## 2023-08-29 DIAGNOSIS — Z7982 Long term (current) use of aspirin: Secondary | ICD-10-CM

## 2023-08-29 DIAGNOSIS — R1010 Upper abdominal pain, unspecified: Secondary | ICD-10-CM | POA: Diagnosis not present

## 2023-08-29 DIAGNOSIS — R008 Other abnormalities of heart beat: Secondary | ICD-10-CM | POA: Diagnosis not present

## 2023-08-29 DIAGNOSIS — R0989 Other specified symptoms and signs involving the circulatory and respiratory systems: Secondary | ICD-10-CM | POA: Diagnosis not present

## 2023-08-29 DIAGNOSIS — R140 Abdominal distension (gaseous): Secondary | ICD-10-CM | POA: Diagnosis not present

## 2023-08-29 DIAGNOSIS — Z9001 Acquired absence of eye: Secondary | ICD-10-CM

## 2023-08-29 DIAGNOSIS — H5410 Blindness, one eye, low vision other eye, unspecified eyes: Secondary | ICD-10-CM | POA: Diagnosis not present

## 2023-08-29 DIAGNOSIS — Z7151 Drug abuse counseling and surveillance of drug abuser: Secondary | ICD-10-CM

## 2023-08-29 DIAGNOSIS — E872 Acidosis, unspecified: Secondary | ICD-10-CM | POA: Diagnosis not present

## 2023-08-29 DIAGNOSIS — Z7902 Long term (current) use of antithrombotics/antiplatelets: Secondary | ICD-10-CM

## 2023-08-29 LAB — COMPREHENSIVE METABOLIC PANEL WITH GFR
ALT: 25 U/L (ref 0–44)
ALT: 23 U/L (ref 0–44)
AST: 26 U/L (ref 15–41)
AST: 30 U/L (ref 15–41)
Albumin: 4.3 g/dL (ref 3.5–5.0)
Albumin: 4 g/dL (ref 3.5–5.0)
Alkaline Phosphatase: 66 U/L (ref 38–126)
Alkaline Phosphatase: 63 U/L (ref 38–126)
Anion gap: 10 (ref 5–15)
Anion gap: 12 (ref 5–15)
BUN: 15 mg/dL (ref 6–20)
BUN: 12 mg/dL (ref 6–20)
CO2: 25 mmol/L (ref 22–32)
CO2: 25 mmol/L (ref 22–32)
Calcium: 9.2 mg/dL (ref 8.9–10.3)
Calcium: 8.9 mg/dL (ref 8.9–10.3)
Chloride: 103 mmol/L (ref 98–111)
Chloride: 99 mmol/L (ref 98–111)
Creatinine, Ser: 0.83 mg/dL (ref 0.61–1.24)
Creatinine, Ser: 0.86 mg/dL (ref 0.61–1.24)
GFR, Estimated: >60 mL/min (ref >60)
GFR, Estimated: >60 mL/min (ref >60)
Glucose, Bld: 279 mg/dL — ABNORMAL HIGH (ref 70–99)
Glucose, Bld: 276 mg/dL — ABNORMAL HIGH (ref 70–99)
Potassium: 4.3 mmol/L (ref 3.5–5.1)
Potassium: 4.1 mmol/L (ref 3.5–5.1)
Sodium: 138 mmol/L (ref 135–145)
Sodium: 136 mmol/L (ref 135–145)
Total Bilirubin: 0.7 mg/dL (ref 0.0–1.2)
Total Bilirubin: 0.9 mg/dL (ref 0.0–1.2)
Total Protein: 7.5 g/dL (ref 6.5–8.1)
Total Protein: 7.2 g/dL (ref 6.5–8.1)

## 2023-08-29 LAB — TROPONIN I (HIGH SENSITIVITY)
Troponin I (High Sensitivity): 10 ng/L (ref <18)
Troponin I (High Sensitivity): 16 ng/L (ref <18)

## 2023-08-29 LAB — CBC
HCT: 40.4 % (ref 39.0–52.0)
Hemoglobin: 12.3 g/dL — ABNORMAL LOW (ref 13.0–17.0)
MCH: 19.3 pg — ABNORMAL LOW (ref 26.0–34.0)
MCHC: 30.4 g/dL (ref 30.0–36.0)
MCV: 63.5 fL — ABNORMAL LOW (ref 80.0–100.0)
Platelets: 235 10*3/uL (ref 150–400)
RBC: 6.36 MIL/uL — ABNORMAL HIGH (ref 4.22–5.81)
RDW: 18.9 % — ABNORMAL HIGH (ref 11.5–15.5)
WBC: 23.4 10*3/uL — ABNORMAL HIGH (ref 4.0–10.5)
nRBC: 0 % (ref 0.0–0.2)

## 2023-08-29 LAB — BLOOD GAS, ARTERIAL
Acid-base deficit: 0.2 mmol/L (ref 0.0–2.0)
Bicarbonate: 24 mmol/L (ref 20.0–28.0)
Drawn by: 441371
O2 Saturation: 97 %
Patient temperature: 37.1
pCO2 arterial: 37 mmHg (ref 32–48)
pH, Arterial: 7.42 (ref 7.35–7.45)
pO2, Arterial: 70 mmHg — ABNORMAL LOW (ref 83–108)

## 2023-08-29 LAB — ETHANOL: Alcohol, Ethyl (B): <15 mg/dL (ref <15)

## 2023-08-29 LAB — LACTIC ACID, PLASMA
Lactic Acid, Venous: 4 mmol/L (ref 0.5–1.9)
Lactic Acid, Venous: 5 mmol/L (ref 0.5–1.9)
Lactic Acid, Venous: 5.8 mmol/L (ref 0.5–1.9)
Lactic Acid, Venous: 6.5 mmol/L (ref 0.5–1.9)
Lactic Acid, Venous: 6.5 mmol/L (ref 0.5–1.9)
Lactic Acid, Venous: 4.6 mmol/L (ref 0.5–1.9)

## 2023-08-29 LAB — RAPID URINE DRUG SCREEN, HOSP PERFORMED
Amphetamines: NOT DETECTED
Barbiturates: NOT DETECTED
Benzodiazepines: NOT DETECTED
Cocaine: POSITIVE — AB
Opiates: NOT DETECTED
Tetrahydrocannabinol: NOT DETECTED

## 2023-08-29 LAB — CBC WITH DIFFERENTIAL/PLATELET
Abs Immature Granulocytes: 0.14 10*3/uL — ABNORMAL HIGH (ref 0.00–0.07)
Basophils Absolute: 0.1 10*3/uL (ref 0.0–0.1)
Basophils Relative: 0 %
Eosinophils Absolute: 0 10*3/uL (ref 0.0–0.5)
Eosinophils Relative: 0 %
HCT: 41.7 % (ref 39.0–52.0)
Hemoglobin: 12.7 g/dL — ABNORMAL LOW (ref 13.0–17.0)
Immature Granulocytes: 1 %
Lymphocytes Relative: 10 %
Lymphs Abs: 2.1 10*3/uL (ref 0.7–4.0)
MCH: 19.4 pg — ABNORMAL LOW (ref 26.0–34.0)
MCHC: 30.5 g/dL (ref 30.0–36.0)
MCV: 63.7 fL — ABNORMAL LOW (ref 80.0–100.0)
Monocytes Absolute: 0.8 10*3/uL (ref 0.1–1.0)
Monocytes Relative: 4 %
Neutro Abs: 18 10*3/uL — ABNORMAL HIGH (ref 1.7–7.7)
Neutrophils Relative %: 85 %
Platelets: 221 10*3/uL (ref 150–400)
RBC: 6.55 MIL/uL — ABNORMAL HIGH (ref 4.22–5.81)
RDW: 18.8 % — ABNORMAL HIGH (ref 11.5–15.5)
WBC: 21.1 10*3/uL — ABNORMAL HIGH (ref 4.0–10.5)
nRBC: 0.1 % (ref 0.0–0.2)

## 2023-08-29 LAB — DIFFERENTIAL
Abs Immature Granulocytes: 0.14 10*3/uL — ABNORMAL HIGH (ref 0.00–0.07)
Basophils Absolute: 0.1 10*3/uL (ref 0.0–0.1)
Basophils Relative: 0 %
Eosinophils Absolute: 0 10*3/uL (ref 0.0–0.5)
Eosinophils Relative: 0 %
Immature Granulocytes: 1 %
Lymphocytes Relative: 7 %
Lymphs Abs: 1.7 10*3/uL (ref 0.7–4.0)
Monocytes Absolute: 1 10*3/uL (ref 0.1–1.0)
Monocytes Relative: 4 %
Neutro Abs: 20.4 10*3/uL — ABNORMAL HIGH (ref 1.7–7.7)
Neutrophils Relative %: 88 %

## 2023-08-29 LAB — URINALYSIS, ROUTINE W REFLEX MICROSCOPIC
Bacteria, UA: NONE SEEN
Bilirubin Urine: NEGATIVE
Glucose, UA: >=500 mg/dL — AB
Ketones, ur: NEGATIVE mg/dL
Leukocytes,Ua: NEGATIVE
Nitrite: NEGATIVE
Protein, ur: NEGATIVE mg/dL
Specific Gravity, Urine: 1.01 (ref 1.005–1.030)
pH: 6 (ref 5.0–8.0)

## 2023-08-29 LAB — RESPIRATORY PANEL BY PCR

## 2023-08-29 LAB — HEMOGLOBIN A1C
Hgb A1c MFr Bld: 9 % — ABNORMAL HIGH (ref 4.8–5.6)
Mean Plasma Glucose: 211.6 mg/dL

## 2023-08-29 LAB — BETA-HYDROXYBUTYRIC ACID
Beta-Hydroxybutyric Acid: 0.09 mmol/L (ref 0.05–0.27)
Beta-Hydroxybutyric Acid: 0.23 mmol/L (ref 0.05–0.27)

## 2023-08-29 LAB — LIPASE, BLOOD: Lipase: 26 U/L (ref 11–51)

## 2023-08-29 LAB — TECHNOLOGIST SMEAR REVIEW

## 2023-08-29 LAB — GLUCOSE, CAPILLARY
Glucose-Capillary: 197 mg/dL — ABNORMAL HIGH (ref 70–99)
Glucose-Capillary: 299 mg/dL — ABNORMAL HIGH (ref 70–99)
Glucose-Capillary: 226 mg/dL — ABNORMAL HIGH (ref 70–99)
Glucose-Capillary: 223 mg/dL — ABNORMAL HIGH (ref 70–99)

## 2023-08-29 LAB — MRSA NEXT GEN BY PCR, NASAL: MRSA by PCR Next Gen: NOT DETECTED

## 2023-08-29 LAB — CBG MONITORING, ED: Glucose-Capillary: 261 mg/dL — ABNORMAL HIGH (ref 70–99)

## 2023-08-29 MED ORDER — LABETALOL HCL 5 MG/ML IV SOLN
20.0000 mg | INTRAVENOUS | Status: DC | PRN
  Administered 2023-08-29: 20 mg via INTRAVENOUS
  Filled 2023-08-29: qty 4

## 2023-08-29 MED ORDER — IOHEXOL 300 MG/ML  SOLN
100.0000 mL | Freq: Once | INTRAMUSCULAR | Status: AC | PRN
  Administered 2023-08-29: 100 mL via INTRAVENOUS

## 2023-08-29 MED ORDER — INSULIN ASPART 100 UNIT/ML IJ SOLN
2.0000 [IU] | INTRAMUSCULAR | Status: DC
  Administered 2023-08-29 (×3): 6 [IU] via SUBCUTANEOUS
  Administered 2023-08-30 (×2): 2 [IU] via SUBCUTANEOUS
  Administered 2023-08-30: 4 [IU] via SUBCUTANEOUS
  Administered 2023-08-30: 2 [IU] via SUBCUTANEOUS
  Administered 2023-08-30 (×2): 4 [IU] via SUBCUTANEOUS
  Administered 2023-08-31: 6 [IU] via SUBCUTANEOUS
  Administered 2023-08-31: 2 [IU] via SUBCUTANEOUS
  Administered 2023-08-31 – 2023-09-01 (×2): 4 [IU] via SUBCUTANEOUS

## 2023-08-29 MED ORDER — THIAMINE HCL 100 MG/ML IJ SOLN
500.0000 mg | Freq: Three times a day (TID) | INTRAVENOUS | Status: AC
  Administered 2023-08-29 – 2023-08-30 (×3): 500 mg via INTRAVENOUS
  Filled 2023-08-29 (×3): qty 5

## 2023-08-29 MED ORDER — SODIUM CHLORIDE 0.9 % IV SOLN
2.0000 g | Freq: Three times a day (TID) | INTRAVENOUS | Status: DC
  Administered 2023-08-29 – 2023-08-31 (×6): 2 g via INTRAVENOUS
  Filled 2023-08-29 (×6): qty 12.5

## 2023-08-29 MED ORDER — AMLODIPINE BESYLATE 10 MG PO TABS
10.0000 mg | ORAL_TABLET | Freq: Every day | ORAL | Status: DC
  Administered 2023-08-30 – 2023-09-03 (×5): 10 mg via ORAL
  Filled 2023-08-29 (×4): qty 2
  Filled 2023-08-29: qty 1

## 2023-08-29 MED ORDER — SODIUM CHLORIDE 0.9 % IV BOLUS
1000.0000 mL | Freq: Once | INTRAVENOUS | Status: AC
  Administered 2023-08-29: 1000 mL via INTRAVENOUS

## 2023-08-29 MED ORDER — VANCOMYCIN HCL 1250 MG/250ML IV SOLN
1250.0000 mg | Freq: Two times a day (BID) | INTRAVENOUS | Status: DC
  Filled 2023-08-29: qty 250

## 2023-08-29 MED ORDER — INSULIN GLARGINE-YFGN 100 UNIT/ML ~~LOC~~ SOLN
12.0000 [IU] | Freq: Every day | SUBCUTANEOUS | Status: DC
Start: 2023-08-29 — End: 2023-09-03
  Administered 2023-08-29 – 2023-09-03 (×6): 12 [IU] via SUBCUTANEOUS
  Filled 2023-08-29 (×6): qty 0.12

## 2023-08-29 MED ORDER — ONDANSETRON HCL 4 MG/2ML IJ SOLN
4.0000 mg | Freq: Four times a day (QID) | INTRAMUSCULAR | Status: DC | PRN
  Administered 2023-08-29 – 2023-08-30 (×3): 4 mg via INTRAVENOUS
  Filled 2023-08-29 (×3): qty 2

## 2023-08-29 MED ORDER — ONDANSETRON HCL 4 MG PO TABS: 4.0000 mg | ORAL_TABLET | Freq: Four times a day (QID) | ORAL | Status: DC | PRN

## 2023-08-29 MED ORDER — LACTATED RINGERS IV BOLUS
1000.0000 mL | Freq: Once | INTRAVENOUS | Status: AC
  Administered 2023-08-29: 1000 mL via INTRAVENOUS

## 2023-08-29 MED ORDER — ATORVASTATIN CALCIUM 40 MG PO TABS
40.0000 mg | ORAL_TABLET | Freq: Every day | ORAL | Status: DC
  Administered 2023-08-30 – 2023-09-03 (×5): 40 mg via ORAL
  Filled 2023-08-29 (×5): qty 1

## 2023-08-29 MED ORDER — ORAL CARE MOUTH RINSE: 15.0000 mL | OROMUCOSAL | Status: DC | PRN

## 2023-08-29 MED ORDER — LACTATED RINGERS IV SOLN: INTRAVENOUS | Status: AC

## 2023-08-29 MED ORDER — ENOXAPARIN SODIUM 40 MG/0.4ML IJ SOSY
40.0000 mg | PREFILLED_SYRINGE | INTRAMUSCULAR | Status: DC
  Administered 2023-08-29 – 2023-09-02 (×5): 40 mg via SUBCUTANEOUS
  Filled 2023-08-29 (×5): qty 0.4

## 2023-08-29 MED ORDER — METRONIDAZOLE 500 MG/100ML IV SOLN
500.0000 mg | Freq: Once | INTRAVENOUS | Status: AC
  Administered 2023-08-29: 500 mg via INTRAVENOUS
  Filled 2023-08-29: qty 100

## 2023-08-29 MED ORDER — HYDRALAZINE HCL 20 MG/ML IJ SOLN
10.0000 mg | Freq: Once | INTRAMUSCULAR | Status: AC
  Administered 2023-08-29: 10 mg via INTRAVENOUS
  Filled 2023-08-29: qty 1

## 2023-08-29 MED ORDER — ACETAMINOPHEN 650 MG RE SUPP: 650.0000 mg | Freq: Four times a day (QID) | RECTAL | Status: DC | PRN

## 2023-08-29 MED ORDER — SODIUM CHLORIDE 0.9 % IV SOLN
12.5000 mg | Freq: Once | INTRAVENOUS | Status: AC
  Administered 2023-08-29: 12.5 mg via INTRAVENOUS
  Filled 2023-08-29: qty 12.5

## 2023-08-29 MED ORDER — VANCOMYCIN HCL 1.25 G IV SOLR
1250.0000 mg | Freq: Two times a day (BID) | INTRAVENOUS | Status: DC
  Administered 2023-08-29 – 2023-08-30 (×2): 1250 mg via INTRAVENOUS
  Filled 2023-08-29 (×2): qty 25

## 2023-08-29 MED ORDER — VANCOMYCIN HCL 1250 MG/250ML IV SOLN
1250.0000 mg | Freq: Two times a day (BID) | INTRAVENOUS | Status: DC
Start: 2023-08-29 — End: 2023-08-29
  Filled 2023-08-29: qty 250

## 2023-08-29 MED ORDER — ONDANSETRON HCL 4 MG/2ML IJ SOLN
4.0000 mg | Freq: Once | INTRAMUSCULAR | Status: AC
  Administered 2023-08-29: 4 mg via INTRAVENOUS
  Filled 2023-08-29: qty 2

## 2023-08-29 MED ORDER — FENTANYL CITRATE PF 50 MCG/ML IJ SOSY: 25.0000 ug | PREFILLED_SYRINGE | INTRAMUSCULAR | Status: DC | PRN

## 2023-08-29 MED ORDER — PANTOPRAZOLE SODIUM 40 MG IV SOLR
40.0000 mg | Freq: Once | INTRAVENOUS | Status: AC
  Administered 2023-08-29: 40 mg via INTRAVENOUS
  Filled 2023-08-29: qty 10

## 2023-08-29 MED ORDER — METRONIDAZOLE 500 MG/100ML IV SOLN
500.0000 mg | Freq: Two times a day (BID) | INTRAVENOUS | Status: AC
  Administered 2023-08-29 – 2023-09-02 (×9): 500 mg via INTRAVENOUS
  Filled 2023-08-29 (×9): qty 100

## 2023-08-29 MED ORDER — SODIUM CHLORIDE 0.9 % IV SOLN
2.0000 g | Freq: Once | INTRAVENOUS | Status: AC
  Administered 2023-08-29: 2 g via INTRAVENOUS
  Filled 2023-08-29: qty 12.5

## 2023-08-29 MED ORDER — VANCOMYCIN HCL IN DEXTROSE 1-5 GM/200ML-% IV SOLN
1000.0000 mg | Freq: Once | INTRAVENOUS | Status: AC
  Administered 2023-08-29: 1000 mg via INTRAVENOUS
  Filled 2023-08-29: qty 200

## 2023-08-29 MED ORDER — CILOSTAZOL 100 MG PO TABS
100.0000 mg | ORAL_TABLET | Freq: Two times a day (BID) | ORAL | Status: DC
  Administered 2023-08-30 – 2023-09-03 (×9): 100 mg via ORAL
  Filled 2023-08-29 (×10): qty 1

## 2023-08-29 MED ORDER — CLEVIDIPINE BUTYRATE 0.5 MG/ML IV EMUL
0.0000 mg/h | INTRAVENOUS | Status: DC
  Administered 2023-08-29: 2 mg/h via INTRAVENOUS
  Administered 2023-08-29: 14 mg/h via INTRAVENOUS
  Administered 2023-08-29: 12 mg/h via INTRAVENOUS
  Administered 2023-08-30: 11 mg/h via INTRAVENOUS
  Administered 2023-08-30: 16 mg/h via INTRAVENOUS
  Administered 2023-08-30: 17 mg/h via INTRAVENOUS
  Administered 2023-08-30: 18 mg/h via INTRAVENOUS
  Administered 2023-08-30 (×3): 21 mg/h via INTRAVENOUS
  Administered 2023-08-30: 17 mg/h via INTRAVENOUS
  Filled 2023-08-29 (×11): qty 100

## 2023-08-29 MED ORDER — CHLORHEXIDINE GLUCONATE CLOTH 2 % EX PADS
6.0000 | MEDICATED_PAD | Freq: Every day | CUTANEOUS | Status: DC
  Administered 2023-08-29 – 2023-08-31 (×2): 6 via TOPICAL

## 2023-08-29 MED ORDER — HYDRALAZINE HCL 20 MG/ML IJ SOLN
20.0000 mg | INTRAMUSCULAR | Status: DC | PRN
  Administered 2023-08-29 – 2023-08-31 (×4): 20 mg via INTRAVENOUS
  Filled 2023-08-29 (×4): qty 1

## 2023-08-29 MED ORDER — ACETAMINOPHEN 325 MG PO TABS: 650.0000 mg | ORAL_TABLET | Freq: Four times a day (QID) | ORAL | Status: DC | PRN

## 2023-08-29 MED ORDER — METOCLOPRAMIDE HCL 5 MG/ML IJ SOLN
10.0000 mg | Freq: Once | INTRAMUSCULAR | Status: AC
  Administered 2023-08-29: 10 mg via INTRAVENOUS
  Filled 2023-08-29: qty 2

## 2023-08-29 MED ORDER — PROCHLORPERAZINE EDISYLATE 10 MG/2ML IJ SOLN
10.0000 mg | Freq: Four times a day (QID) | INTRAMUSCULAR | Status: DC | PRN
  Administered 2023-08-29 – 2023-08-30 (×4): 10 mg via INTRAVENOUS
  Filled 2023-08-29 (×5): qty 2

## 2023-08-29 MED ORDER — INSULIN ASPART 100 UNIT/ML IJ SOLN
0.0000 [IU] | Freq: Three times a day (TID) | INTRAMUSCULAR | Status: DC
Start: 2023-08-29 — End: 2023-08-29

## 2023-08-29 NOTE — ED Provider Notes (Signed)
 Frank Schneider EMERGENCY DEPARTMENT AT [facility] Provider Note   CSN: [id number] Arrival date & time: 08/29/23  4098     History  Chief Complaint  Patient presents with   Hyperglycemia   Nausea   Emesis    Frank Schneider is a 48 y.o. male.  Patient with a history of diabetes, hypertension, previous stroke with left-sided deficits, claudication here with nausea and vomiting and abdominal pain.  Reports he had 2 shots of alcohol  as well as pork skins last night which is unusual for him.  He developed nausea and vomiting shortly thereafter.  States he has had vomiting for the past 2 to 3 hours and has been clear with some blood streaks.  On arrival he is diaphoretic and dry heaving.  Complains of diffuse abdominal pain.  Denies diarrhea.  Denies fever.  Denies chest pain or shortness of breath.  Denies headache.  Denies pain with urination or blood in the urine.  EMS reports blood sugar was 281.  Did take his Lantus  last night.  Has been compliant with his sliding scale insulin .  No recent hematuria or dysuria.  EMS reports blood sugar was 281.  No history of ulcers or acid reflux.  The history is provided by the patient and the EMS personnel. The history is limited by the condition of the patient.  Hyperglycemia Associated symptoms: abdominal pain, diaphoresis, fatigue, nausea, vomiting and weakness   Associated symptoms: no chest pain, no dizziness, no dysuria, no fever and no shortness of breath   Emesis Associated symptoms: abdominal pain   Associated symptoms: no arthralgias, no cough, no fever, no headaches and no myalgias        Home Medications Prior to Admission medications   Medication Sig Start Date End Date Taking? Authorizing Provider  Accu-Chek Softclix Lancets lancets Use in the morning and at bedtime 06/22/22   Nichols, Tonya S, NP  acetaminophen  (TYLENOL ) 500 MG tablet Take 2 tablets (Total= 1,000 mg), by mouth, 2 times a day as needed. Patient not taking:  Reported on 03/02/2023 11/04/21   Jegede, Olugbemiga E, MD  amLODipine  (NORVASC ) 10 MG tablet Take 1 tablet (10 mg total) by mouth daily. 05/11/23   Jerrlyn Morel, NP  amLODipine  (NORVASC ) 10 MG tablet Take 1 tablet (10 mg total) by mouth daily. 05/17/23   Jerrlyn Morel, NP  aspirin  325 MG tablet Take 1 tablet (325 mg total) by mouth daily. 06/28/23   Jerrlyn Morel, NP  atorvastatin  (LIPITOR) 40 MG tablet Take 1 tablet (40 mg total) by mouth daily. 06/28/23   Jerrlyn Morel, NP  Blood Glucose Monitoring Suppl (TRUE METRIX AIR GLUCOSE METER) DEVI 1 Units by Does not apply route 2 (two) times daily. 06/22/22   Jerrlyn Morel, NP  cilostazol  (PLETAL ) 100 MG tablet Take 1 tablet (100 mg total) by mouth 2 (two) times daily before a meal. 09/03/22   Young Hensen, MD  Continuous Glucose Sensor (FREESTYLE LIBRE 3 SENSOR) MISC Place 1 sensor on the skin every 14 days. Use to check glucose continuously 03/24/23   Paseda, Folashade R, FNP  dicyclomine  (BENTYL ) 20 MG tablet Take 1 tablet (20 mg total) by mouth 2 (two) times daily as needed for spasms. 03/24/23   Small, Brooke L, PA  empagliflozin  (JARDIANCE ) 25 MG TABS tablet Take 1 tablet (25 mg total) by mouth daily before breakfast. 07/16/23   Jerrlyn Morel, NP  glucose blood test strip Use as instructed 06/22/22   Era Hasty,  Tonya S, NP  ibuprofen  (ADVIL ) 800 MG tablet Take 1 tablet (800 mg total) by mouth every 8 (eight) hours as needed. 05/11/23   Jerrlyn Morel, NP  insulin  glargine (LANTUS  SOLOSTAR) 100 UNIT/ML Solostar Pen Inject 16 Units into the skin at bedtime. Patient not taking: Reported on 03/02/2023 01/07/23   Jerrlyn Morel, NP  Insulin  Pen Needle (PEN NEEDLES) 32G X 4 MM MISC Use to inject insulin  daily as directed. Patient not taking: Reported on 03/02/2023 10/07/22   Jerrlyn Morel, NP  lisinopril  (ZESTRIL ) 20 MG tablet Take 1 tablet (20 mg total) by mouth daily. 05/11/23   Nichols, Tonya S, NP  loperamide  (IMODIUM  A-D) 2 MG tablet  Take 1 tablet (2 mg total) by mouth 4 (four) times daily as needed for diarrhea or loose stools. Patient not taking: Reported on 02/23/2023 01/22/23   Jerrlyn Morel, NP  metFORMIN  (GLUCOPHAGE -XR) 500 MG 24 hr tablet Take 1 tablet (500 mg total) by mouth 2 (two) times daily. 07/29/23   Jerrlyn Morel, NP  Semaglutide ,0.25 or 0.5MG /DOS, (OZEMPIC , 0.25 OR 0.5 MG/DOSE,) 2 MG/3ML SOPN Inject 0.5 mg into the skin once a week. Ok to fill early 05/18/23   Jerrlyn Morel, NP  Blood Glucose Monitoring Suppl (ACCU-CHEK GUIDE) w/Device KIT Use in the morning, at noon, in the evening, and at bedtime. Patient not taking: Reported on 06/22/2022 10/16/20   Gregoria Leas, NP  glucose blood (TRUE METRIX BLOOD GLUCOSE TEST) test strip Use as instructed 07/20/18   Emmie Harness, FNP  Lancets (FREESTYLE) lancets Use as instructed Patient not taking: Reported on 06/22/2022 01/03/15   Abrol, Nayana, MD      Allergies    Patient has no known allergies.    Review of Systems   Review of Systems  Constitutional:  Positive for activity change, appetite change, diaphoresis and fatigue. Negative for fever.  HENT:  Negative for congestion and rhinorrhea.   Respiratory:  Negative for cough, chest tightness and shortness of breath.   Cardiovascular:  Negative for chest pain.  Gastrointestinal:  Positive for abdominal pain, nausea and vomiting.  Genitourinary:  Negative for dysuria and hematuria.  Musculoskeletal:  Negative for arthralgias and myalgias.  Skin:  Negative for rash.  Neurological:  Positive for weakness. Negative for dizziness and headaches.   all other systems are negative except as noted in the HPI and PMH.    Physical Exam Updated Vital Signs Pulse (P) 88   Temp (P) 97.9 F (36.6 C) (Oral)   Resp (P) 19   SpO2 (P) 98%  Physical Exam Vitals and nursing note reviewed.  Constitutional:      General: He is not in acute distress.    Appearance: He is well-developed. He is ill-appearing and  diaphoretic.     Comments: Diaphoretic, dry heaving  HENT:     Head: Normocephalic and atraumatic.     Mouth/Throat:     Pharynx: No oropharyngeal exudate.  Eyes:     Conjunctiva/sclera: Conjunctivae normal.     Comments: Right eye enucleation  Neck:     Comments: No meningismus. Cardiovascular:     Rate and Rhythm: Normal rate and regular rhythm.     Heart sounds: Normal heart sounds. No murmur heard. Pulmonary:     Effort: Pulmonary effort is normal. No respiratory distress.     Breath sounds: Normal breath sounds.  Abdominal:     Palpations: Abdomen is soft.     Tenderness: There is abdominal tenderness. There  is no guarding or rebound.     Comments: Soft, diffusely tender, no guarding or rebound  Musculoskeletal:        General: No tenderness. Normal range of motion.     Cervical back: Normal range of motion and neck supple.  Skin:    General: Skin is warm.  Neurological:     Mental Status: He is alert and oriented to person, place, and time.     Motor: No abnormal muscle tone.     Comments: No facial droop.  Tongue is midline.  Able to hold arms and legs off the bed bilaterally.  Psychiatric:        Behavior: Behavior normal.     ED Results / Procedures / Treatments   Labs (all labs ordered are listed, but only abnormal results are displayed) Labs Reviewed  CBC WITH DIFFERENTIAL/PLATELET - Abnormal; Notable for the following components:      Result Value   WBC 21.1 (*)    RBC 6.55 (*)    Hemoglobin 12.7 (*)    MCV 63.7 (*)    MCH 19.4 (*)    RDW 18.8 (*)    Neutro Abs 18.0 (*)    Abs Immature Granulocytes 0.14 (*)    All other components within normal limits  LACTIC ACID, PLASMA - Abnormal; Notable for the following components:   Lactic Acid, Venous 4.0 (*)    All other components within normal limits  COMPREHENSIVE METABOLIC PANEL WITH GFR - Abnormal; Notable for the following components:   Glucose, Bld 279 (*)    All other components within normal limits   CBG MONITORING, ED - Abnormal; Notable for the following components:   Glucose-Capillary 261 (*)    All other components within normal limits  BETA-HYDROXYBUTYRIC ACID  LIPASE, BLOOD  LACTIC ACID, PLASMA  URINALYSIS, ROUTINE W REFLEX MICROSCOPIC  I-STAT CHEM 8, ED  TROPONIN I (HIGH SENSITIVITY)    EKG EKG Interpretation Date/Time:  Sunday Aug 29 2023 05:49:15 EDT Ventricular Rate:  74 PR Interval:  158 QRS Duration:  98 QT Interval:  400 QTC Calculation: 444 R Axis:   22  Text Interpretation: Sinus rhythm Probable left atrial enlargement Left ventricular hypertrophy Baseline wander in lead(s) V1 Nonspecific T wave abnormality Confirmed by Earma Gloss [phone]) on 08/29/2023 6:02:29 AM  Radiology No results found.  Procedures Procedures    Medications Ordered in ED Medications  ondansetron  (ZOFRAN ) injection 4 mg (has no administration in time range)  pantoprazole  (PROTONIX ) injection 40 mg (has no administration in time range)  lactated ringers  bolus 1,000 mL (1,000 mLs Intravenous New Bag/Given 08/29/23 0557)    ED Course/ Medical Decision Making/ A&P                                 Medical Decision Making Amount and/or Complexity of Data Reviewed Independent Historian: EMS Labs: ordered. Decision-making details documented in ED Course. Radiology: ordered and independent interpretation performed. Decision-making details documented in ED Course. ECG/medicine tests: ordered and independent interpretation performed. Decision-making details documented in ED Course.  Risk Prescription drug management.   Diabetic with nausea, vomiting, and hyperglycemia.  Alcohol  use and ate pork skins last night.  On arrival he is ill-appearing and uncomfortable and diaphoretic.  CBG 261.  EKG without acute ischemia.  Will hydrate, treat symptoms, rule out DKA.  Labs show hyperglycemia without DKA.  Normal anion gap.  Lactate is 4.0 however.  Will hydrate  aggressively.  Given  Protonix  for concern for possible alcoholic gastritis or esophagitis.  Low concern for significant GI bleed.   Leukocytosis appears to be chronic. Possibly reactive.  Persistent nausea vomiting after Zofran .  Phenergan given.  Favor likely alcoholic gastritis or esophagitis.  No history of gastroparesis.  Does not appear to be in DKA.  He is hypertensive and did not take his medications this morning. IV hydralazine  to be given.   Care to be transferred at shift change pending urinalysis, CT abdomen pelvis and recheck of symptoms and lactic acidosis.  May require admission for intractable nausea and vomiting if symptoms not improved.   Dr. Leighton Punches to assume care at shift change.        Final Clinical Impression(s) / ED Diagnoses Final diagnoses:  None    Rx / DC Orders ED Discharge Orders     None         Tamel Abel, Mara [place], MD 08/29/23 [phone]

## 2023-08-29 NOTE — ED Triage Notes (Addendum)
 48 y/o male comes in c/o "high" blood sugar 281 with EMS, nausea and vomiting that started about 3 hours ago. Pt reports he has been taking his insulin  and take Ozempic  "once in a while." Pt vomiting on arrival and unable to provide any further information regarding today's ER visit. No meds give PTA

## 2023-08-29 NOTE — Plan of Care (Signed)
  Problem: Clinical Measurements: Goal: Ability to maintain clinical measurements within normal limits will improve Outcome: Progressing Goal: Respiratory complications will improve Outcome: Progressing   Problem: Clinical Measurements: Goal: Will remain free from infection Outcome: Not Progressing Goal: Diagnostic test results will improve Outcome: Not Progressing

## 2023-08-29 NOTE — Consult Note (Signed)
 NAME:  Frank Schneider, MRN:  [id number], DOB:  April 27, 1976, LOS: 0 ADMISSION DATE:  08/29/2023, CONSULTATION DATE: 5/4 REFERRING MD: Mauricia South, CHIEF COMPLAINT: Lactic acidosis  History of Present Illness:  Frank Schneider is a 48 year old gentleman with a history of tobacco abuse, peripheral artery disease, diabetes on insulin  who presented to the hospital with 3 hours of nausea and vomiting.  He reports history of intermittent compliance with his insulin  and Ozempic .  He is no longer prescribed metformin , although it is still listed in his chart.  He reports that he took his insulin  yesterday.  Last night he had some alcohol  and pork skins, which he says is unusual for him.  He reported in the ED that some of his emesis had blood streaks.  Per ED note, no chest pain or shortness of breath, headaches, dysuria.  Blood sugar with EMS was 281.  In the emergency department he was found to have lactic acidosis and was started on broad-spectrum antibiotics-cefepime and vancomycin .  Due to increasing lactic acid level despite volume resuscitation of 2 L LR bolus PCCM was consulted.  Per nurses in the ICU antiemetics transiently helped but he has had several episodes of vomiting since being here.  Systolic blood pressure in the 200s.  Pertinent  Medical History  Diabetes on insulin  Hypertension History of stroke Peripheral artery disease Vitamin D  deficiency Tobacco abuse  Significant Hospital Events: Including procedures, antibiotic start and stop dates in addition to other pertinent events   5/4 admitted, 2 L bolus, cefepime vancomycin  started  Interim History / Subjective:    Objective   Blood pressure (!) 225/71, pulse 87, temperature 97.6 F (36.4 C), temperature source Oral, resp. rate 19, height 5\' 10"  (1.778 m), weight 72.6 kg, SpO2 100%.        Intake/Output Summary (Last 24 hours) at 08/29/2023 1350 Last data filed at 08/29/2023 1220 Gross per 24 hour  Intake 2831.56 ml  Output --  Net  2831.56 ml   Filed Weights   08/29/23 8295  Weight: 72.6 kg    Examination: General: Fatigued appearing middle-age man lying in bed no acute distress HENT: [place]/AT, eyes anicteric Lungs: Breathing comfortably on room air, CTAB Cardiovascular: S1-S2, regular rate and rhythm Abdomen: Soft, nontender, nondistended, no guarding Extremities: No peripheral edema Neuro: Fatigued, arouses during exam, answers questions appropriately.  Moving all extremities. Derm: Warm, dry, no diffuse rashes  LA 4>5> 5.8 Troponin 16 Direct CPR eight 0.09 Bicarb 25, anion gap 10 BUN 15 Creatinine 0.83 WBC 21.1 H/H 12.7/41.7 Platelets 221 UA 0-5 WBCs CT abdomen pelvis personally reviewed-Motion artifact and mild groundglass opacities in the bases of the lungs, possibly atelectasis.  No abnormalities in the abdomen and pelvis.  EKG  personally reviewed> NSR, normal axis and intervals.  TWI in III only, no ST segment changes.   Resolved Hospital Problem list     Assessment & Plan:  Nausea and vomiting, refractory - Antiemetics - Hydration -N.p.o. -Check respiratory viral panel to rule out viral cause.  If he starts having diarrhea we will get a GI panel. -This would be a rather acute presentation of gastroparesis, but that is possible with Ozempic .  Lactic acidosis is inconsistent with this.  Refractory lactic acidosis-unknown etiology unless he is starting to mobilize lactate with volume resuscitation, which every shift should downtrend quickly.  Not on PTA metformin -should be taken off his med list when able to be reviewed.  No significant pain to suggest mesenteric ischemia. -Trend - Increase maintenance fluids to  100 cc/h -Maintain adequate perfusion -Continue broad-spectrum antibiotics and follow cultures  Fatigue and lethargy, most likely due to medication side effect after getting antiemetics - Monitor mental status closely  Hyperglycemia - Resume glargine at reduced dose-12 units  daily - Sliding scale insulin  changed to every 4 since he is not eating -Can add mealtime insulin  when eating - Goal blood glucose 140 and 180 -Hold PTA Jardiance   Uncontrolled hypertension, may be unable to keep pills down -Cleviprex for goal systolic less than 170 for now -Can try giving oral meds, but this may not be successful    Best Practice (right click and "Reselect all SmartList Selections" daily)   Diet/type: NPO w/ oral meds DVT prophylaxis SCD Pressure ulcer(s): N/A GI prophylaxis: PPI Lines: N/A Foley:  N/A Code Status:  full code Last date of multidisciplinary goals of care discussion [ ]   Labs   CBC: Recent Labs  Lab 08/29/23 0542  WBC 21.1*  NEUTROABS 18.0*  HGB 12.7*  HCT 41.7  MCV 63.7*  PLT 221    Basic Metabolic Panel: Recent Labs  Lab 08/29/23 0542  NA 138  K 4.3  CL 103  CO2 25  GLUCOSE 279*  BUN 15  CREATININE 0.83  CALCIUM  9.2   GFR: Estimated Creatinine Clearance: 113 mL/min (by C-G formula based on SCr of 0.83 mg/dL). Recent Labs  Lab 08/29/23 0542 08/29/23 0742 08/29/23 1302  WBC 21.1*  --   --   LATICACIDVEN 4.0* 5.0* 5.8*    Liver Function Tests: Recent Labs  Lab 08/29/23 0542  AST 26  ALT 25  ALKPHOS 66  BILITOT 0.7  PROT 7.5  ALBUMIN 4.3   Recent Labs  Lab 08/29/23 0542  LIPASE 26   No results for input(s): "AMMONIA" in the last 168 hours.  ABG    Component Value Date/Time   TCO2 28 04/29/2018 1612     Coagulation Profile: No results for input(s): "INR", "PROTIME" in the last 168 hours.  Cardiac Enzymes: No results for input(s): "CKTOTAL", "CKMB", "CKMBINDEX", "TROPONINI" in the last 168 hours.  HbA1C: Hemoglobin A1C  Date/Time Value Ref Range Status  01/07/2023 04:02 PM 7.4 (A) 4.0 - 5.6 % Final  10/07/2022 03:15 PM 7.5 (A) 4.0 - 5.6 % Final   HbA1c, POC (prediabetic range)  Date/Time Value Ref Range Status  01/30/2022 01:51 PM 8.7 (A) 5.7 - 6.4 % Final  10/30/2021 02:09 PM 9.9 (A) 5.7 -  6.4 % Final   HbA1c, POC (controlled diabetic range)  Date/Time Value Ref Range Status  01/30/2022 01:51 PM 8.7 (A) 0.0 - 7.0 % Final  10/30/2021 02:09 PM 9.9 (A) 0.0 - 7.0 % Final   HbA1c POC (<> result, manual entry)  Date/Time Value Ref Range Status  01/30/2022 01:51 PM 8.7 4.0 - 5.6 % Final  10/30/2021 02:09 PM 9.9 4.0 - 5.6 % Final   Hgb A1c MFr Bld  Date/Time Value Ref Range Status  06/22/2022 02:40 PM 10.0 (H) 4.8 - 5.6 % Final    Comment:             Prediabetes: 5.7 - 6.4          Diabetes: >6.4          Glycemic control for adults with diabetes: <7.0   10/30/2021 02:06 PM 9.7 (H) 4.8 - 5.6 % Final    Comment:             Prediabetes: 5.7 - 6.4  Diabetes: >6.4          Glycemic control for adults with diabetes: <7.0     CBG: Recent Labs  Lab 08/29/23 0529 08/29/23 1152  GLUCAP 261* 197*    Review of Systems:   Very limited due to lethargy  Past Medical History:  He,  has a past medical history of Anticoagulated, Beta thalassemia trait, Chronic foot pain, Diabetic neuropathy (HCC), DOE (dyspnea on exertion), History of cerebrovascular accident (CVA) with residual deficit (12/31/2014), History of eye trauma, History of ischemic stroke (05/09/2015), 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   Surgical History:   Past Surgical History:  Procedure Laterality Date   CATARACT EXTRACTION W/ INTRAOCULAR LENS IMPLANT Right 05/27/2016   EVISCERATION Right 06/11/2022   Procedure: EVISCERATION REPAIR WITH POSSIBLE INCLUSION;  Surgeon: Debbra Fairy, MD;  Location: [facility] OR;  Service: Ophthalmology;  Laterality: Right;   INCISION AND DRAINAGE PERIRECTAL ABSCESS   07/28/2011   Procedure: IRRIGATION AND DEBRIDEMENT PERIRECTAL ABSCESS;  Surgeon: Levert Ready, MD;  Location: [facility] ORS;  Service: General;  Laterality: N/A;   of skin muscle and subcutaneous tissue of perimeum  8cmx12cm area    IR GENERIC HISTORICAL  01/08/2016   IR RADIOLOGIST EVAL & MGMT 01/08/2016 [facility] INTERV RAD   LAPAROSCOPIC APPENDECTOMY N/A 12/16/2015   Procedure: APPENDECTOMY LAPAROSCOPIC;  Surgeon: Oza Blumenthal, MD;  Location: [facility] OR[facility];  Service: General;  Laterality: N/A;   PRIAPISM REPAIR     age 30s   (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 He has never used smokeless tobacco. He reports current alcohol  use. He reports current drug use. Drug: Marijuana.   Family History:  His family history includes Cancer in his mother; Depression in his maternal grandmother; Diabetes in his maternal grandmother and mother; Hypertension in his maternal grandmother and mother.   Allergies No Known Allergies   Home Medications  Prior to Admission medications   Medication Sig Start Date End Date Taking? Authorizing Provider  Accu-Chek Softclix Lancets lancets Use in the morning and at bedtime 06/22/22   Nichols, Tonya S, NP  acetaminophen  (TYLENOL ) 500 MG tablet Take 2 tablets (Total= 1,000 mg), by mouth, 2 times a day as needed. Patient not taking: Reported on 03/02/2023 11/04/21   Jegede, Olugbemiga E, MD  amLODipine  (NORVASC ) 10 MG tablet Take 1 tablet (10 mg total) by mouth daily. 05/11/23   Jerrlyn Morel, NP  amLODipine  (NORVASC ) 10 MG tablet Take 1 tablet (10 mg total) by mouth daily. 05/17/23   Jerrlyn Morel, NP  aspirin  325 MG tablet Take 1 tablet (325 mg total) by mouth daily. 06/28/23   Jerrlyn Morel, NP  atorvastatin  (LIPITOR) 40 MG tablet Take 1 tablet (40 mg total) by mouth daily. 06/28/23   Jerrlyn Morel, NP  Blood Glucose Monitoring Suppl (TRUE METRIX AIR GLUCOSE  METER) DEVI 1 Units by Does not apply route 2 (two) times daily. 06/22/22   Jerrlyn Morel, NP  cilostazol  (PLETAL ) 100 MG tablet Take 1 tablet (100 mg total) by mouth 2 (two) times daily before a meal. 09/03/22   Young Hensen, MD  Continuous Glucose Sensor (FREESTYLE  LIBRE 3 SENSOR) MISC Place 1 sensor on the skin every 14 days. Use to check glucose continuously 03/24/23   Paseda, Folashade R, FNP  dicyclomine  (BENTYL ) 20 MG tablet Take 1 tablet (20 mg total) by mouth 2 (two) times daily as needed for spasms. 03/24/23   Small, Brooke L, PA  empagliflozin  (JARDIANCE ) 25 MG TABS tablet Take 1 tablet (25 mg total) by mouth daily before breakfast. 07/16/23   Jerrlyn Morel, NP  glucose blood test strip Use as instructed 06/22/22   Jerrlyn Morel, NP  ibuprofen  (ADVIL ) 800 MG tablet Take 1 tablet (800 mg total) by mouth every 8 (eight) hours as needed. 05/11/23   Jerrlyn Morel, NP  insulin  glargine (LANTUS  SOLOSTAR) 100 UNIT/ML Solostar Pen Inject 16 Units into the skin at bedtime. Patient not taking: Reported on 03/02/2023 01/07/23   Jerrlyn Morel, NP  Insulin  Pen Needle (PEN NEEDLES) 32G X 4 MM MISC Use to inject insulin  daily as directed. Patient not taking: Reported on 03/02/2023 10/07/22   Jerrlyn Morel, NP  lisinopril  (ZESTRIL ) 20 MG tablet Take 1 tablet (20 mg total) by mouth daily. 05/11/23   Jerrlyn Morel, NP  loperamide  (IMODIUM  A-D) 2 MG tablet Take 1 tablet (2 mg total) by mouth 4 (four) times daily as needed for diarrhea or loose stools. Patient not taking: Reported on 02/23/2023 01/22/23   Jerrlyn Morel, NP  metFORMIN  (GLUCOPHAGE -XR) 500 MG 24 hr tablet Take 1 tablet (500 mg total) by mouth 2 (two) times daily. 07/29/23   Jerrlyn Morel, NP  Semaglutide ,0.25 or 0.5MG /DOS, (OZEMPIC , 0.25 OR 0.5 MG/DOSE,) 2 MG/3ML SOPN Inject 0.5 mg into the skin once a week. Ok to fill early 05/18/23   Jerrlyn Morel, NP  Blood Glucose Monitoring Suppl (ACCU-CHEK GUIDE) w/Device KIT Use in  the morning, at noon, in the evening, and at bedtime. Patient not taking: Reported on 06/22/2022 10/16/20   Gregoria Leas, NP  glucose blood (TRUE METRIX BLOOD GLUCOSE TEST) test strip Use as instructed 07/20/18   Emmie Harness, FNP  Lancets (FREESTYLE) lancets Use as instructed Patient not taking: Reported on 06/22/2022 01/03/15   Abrol, Nayana, MD     Critical care time: 45 min.     Joesph Mussel, DO 08/29/23 2:52 PM [facility]  For contact information, see Amion. If no response to pager, please call PCCM consult pager. After hours, 7PM- 7AM, please call Elink.

## 2023-08-29 NOTE — Progress Notes (Addendum)
 LA still climbing, now 6.5 UDS + cocaine Bicarb still 25, AG 12  Plan: High dose thiamine echocardiogram Check ETOH level> was able to add to earlier labs 1L NS bolus (has been getting LR infused, although LFTs WNL still) Con't trending lactates ABG  Joesph Mussel, DO 08/29/23 4:01 PM [facility]  For contact information, see Amion. If no response to pager, please call PCCM consult pager. After hours, 7PM- 7AM, please call Elink.     ABG 7.42/37/70/24 No significant metabolic acidosis despite rising lactate; leads me to think this is more type B or D lactic acidosis.  Would con't with thiamine, IVF and con't trending lactate levels.  Checking peripheral smear- has had leukocytosis since 2023. WBC now similar to what it was in Nov 2024, but he had norovirus then.   Joesph Mussel, DO 08/29/23 4:51 PM [facility]  For contact information, see Amion. If no response to pager, please call PCCM consult pager. After hours, 7PM- 7AM, please call Elink.

## 2023-08-29 NOTE — Progress Notes (Addendum)
 Pharmacy Antibiotic Note  Zenas Mank is a 48 y.o. male admitted on 08/29/2023 with sepsis.  Pharmacy has been consulted for vanc/cefepime dosing.  Plan: Vanc 1250mg  IV q12 - goal AUC 400-550 Cefepime 2g IV q8  Height: 5\' 10"  (177.8 cm) Weight: 72.6 kg (160 lb) IBW/kg (Calculated) : 73  Temp (24hrs), Avg:97.7 F (36.5 C), Min:97.6 F (36.4 C), Max:97.9 F (36.6 C)  Recent Labs  Lab 08/29/23 0542 08/29/23 0742 08/29/23 1302  WBC 21.1*  --   --   CREATININE 0.83  --   --   LATICACIDVEN 4.0* 5.0* 5.8*    Estimated Creatinine Clearance: 113 mL/min (by C-G formula based on SCr of 0.83 mg/dL).    No Known Allergies   Thank you for allowing pharmacy to be a part of this patient's care.  Bernett Brill 08/29/2023 1:54 PM

## 2023-08-29 NOTE — ED Provider Notes (Signed)
 Patient signed to me by Dr. Alison Irvine pending results of abdominal CT and response to therapy.  Patient received 2 L of fluids.  First lactate elevated at 4 and second has gone up to 5.  Patient continues to complain of abdominal discomfort despite receiving analgesics.  Moderate leukocytosis at 21,000.  Abdominal CT did not show any acute findings.  No evidence of DKA at this time.  Will continue to IV hydrate here was started on antibiotics and blood cultures and admit to hospitalist team   Lind Repine, MD 08/29/23 1030

## 2023-08-29 NOTE — H&P (Signed)
 History and Physical    Patient: Frank Schneider NGE:[id number] DOB: 1975/07/12 DOA: 08/29/2023 DOS: the patient was seen and examined on 08/29/2023 PCP: Jerrlyn Morel, NP  Patient coming from: Home  Chief Complaint:  Chief Complaint  Patient presents with   Hyperglycemia   Nausea   Emesis   HPI: Frank Schneider is a 48 y.o. male with medical history significant of pain, diabetic neuropathy, ischemic cerebrovascular accident with residual right, history of eye trauma, right eye removal, hypertension, intermittent claudication, leukocytosis, type 2 diabetes, vision normalities, vitamin D  deficiency who presented to the emergency department with complaints of hyperglycemia, diffuse abdominal pain, nausea and multiple episodes of emesis 20 minutes after eating pork skins followed by 2 shots of liquor.  No emesis, diarrhea, constipation, melena or hematochezia.  No flank pain, dysuria, frequency or hematuria. Endorses chills, diaphoresis, but he denied fever, chills, rhinorrhea, sore throat, wheezing or hemoptysis.  No chest pain, palpitations, PND, orthopnea or recent pitting edema of the lower extremities.   No polyuria, polydipsia, polyphagia or blurred vision.   Lab work: Urinalysis showed glucosuria greater than 500 and small hemoglobin.  CBC showed white count 21.1, hemoglobin 12.7 g/dL and platelets 401.  Lactic acid 4.0 then 5.0 then 5.8 mmol/L.  Troponin x 2 and beta hydroxybutyric acid were normal.  CMP showed glucose of 279 mg/dL, but was otherwise normal.  Lipase was unremarkable.  Imaging: Portable 1 view chest radiograph was negative.  Portable abdominal x-ray with normal bowel gas pattern.  H advanced atherosclerosis.  CT abdomen/pelvis with contrast with no acute abdominal or pelvic pathology identified.  There was dependent basilar and subsegmental atelectasis or minimal consolidation on the right.  Aortic atherosclerosis.  ED course: Initial vital signs were temperature 97.7 F,  pulse 75, respiration 20, BP 166/94 mmHg O2 sat 100% on room air.  The patient received 2000 mL of LR bolus, 1000 mL normal saline bolus, pantoprazole  40 mg IVP, ondansetron  4 mg IVP hydralazine  10 mg IVP, cefepime, metronidazole  and vancomycin .   Review of Systems: As mentioned in the history of present illness. All other systems reviewed and are negative. Past Medical History:  Diagnosis Date   Anticoagulated    ASA 325 mg--- ;managed by pcp  (stroke prevention)   Beta thalassemia trait    hematology/ oncologist--- dr Maryalice Smaller (lov in epic 11-11-2022  elevated A2   Chronic foot pain    Diabetic neuropathy (HCC)    DOE (dyspnea on exertion)    02-23-2023  per pt sob w/ stairs , yard work, and ok with household chores,  does not do any time of walking or exercise   History of cerebrovascular accident (CVA) with residual deficit 12/31/2014   admission in epic;   right thalamic infarct second SVD residual LUE / LLE numb/ ting with sensory deficit   History of eye trauma    02-23-2023  per pt eye injury age late 79s , never seek medical attention,  vision became progressive worse over time;  then pt had stroke 01/ 2020 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,  s/p eye removal 02/ 2024   History of ischemic stroke 05/09/2015   admission in epic;   acute ischemia right ventral pons,  pt was taking his plavix    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 Fulton Job;  taking pletal  twice daily   Leucocytosis    Numbness and tingling of left arm and leg    stroke residual,  sensory deficit resolve   Type 2 diabetes mellitus (HCC)    followed by pcp;   (02-23-2023  per pt has Libre 3,  blood sugar check multiple times daily,  stated average fasting sugar 120s,  stopped doing lantus  daily approx 09/ 2024,  only taking jardiance  daily and weely ozempic )   Vision abnormalities    Vitamin D  deficiency 12/2019   Wears glasses    Past Surgical History:  Procedure Laterality Date   CATARACT EXTRACTION W/ INTRAOCULAR LENS IMPLANT Right 05/27/2016   EVISCERATION Right 06/11/2022   Procedure: EVISCERATION REPAIR WITH POSSIBLE INCLUSION;  Surgeon: Debbra Fairy, MD;  Location: [facility] OR;  Service: Ophthalmology;  Laterality: Right;   INCISION AND DRAINAGE PERIRECTAL ABSCESS  07/28/2011   Procedure: IRRIGATION AND DEBRIDEMENT PERIRECTAL ABSCESS;  Surgeon: Levert Ready, MD;  Location: [facility];  Service: General;  Laterality: N/A;   of skin muscle and subcutaneous tissue of perimeum  8cmx12cm area    IR GENERIC HISTORICAL  01/08/2016   IR RADIOLOGIST EVAL & MGMT 01/08/2016 [facility] INTERV RAD   LAPAROSCOPIC APPENDECTOMY N/A 12/16/2015   Procedure: APPENDECTOMY LAPAROSCOPIC;  Surgeon: Oza Blumenthal, MD;  Location: [facility] OR[facility];  Service: General;  Laterality: N/A;   PRIAPISM REPAIR     age 64s   (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 He has never used smokeless tobacco. He reports current alcohol  use. He reports current drug use. Drug: Marijuana.  No Known Allergies  Family History  Problem Relation Age of Onset   Cancer Mother        throat cancer   Diabetes Mother    Hypertension Mother    Diabetes Maternal Grandmother    Hypertension Maternal Grandmother    Depression Maternal Grandmother     Prior to  Admission medications   Medication Sig Start Date End Date Taking? Authorizing Provider  Accu-Chek Softclix Lancets lancets Use in the morning and at bedtime 06/22/22   Nichols, Tonya S, NP  acetaminophen  (TYLENOL ) 500 MG tablet Take 2 tablets (Total= 1,000 mg), by mouth, 2 times a day as needed. Patient not taking: Reported on 03/02/2023 11/04/21   Jegede, Olugbemiga E, MD  amLODipine  (NORVASC ) 10 MG tablet Take 1 tablet (10 mg total) by mouth daily. 05/11/23   Jerrlyn Morel, NP  amLODipine  (NORVASC ) 10 MG tablet Take 1 tablet (10 mg total) by mouth daily. 05/17/23   Jerrlyn Morel, NP  aspirin  325 MG tablet Take 1 tablet (325 mg total) by mouth daily. 06/28/23   Jerrlyn Morel, NP  atorvastatin  (LIPITOR) 40 MG tablet Take 1 tablet (40 mg total) by mouth daily. 06/28/23   Jerrlyn Morel, NP  Blood Glucose Monitoring Suppl (TRUE METRIX AIR GLUCOSE METER) DEVI 1 Units by Does not apply route 2 (two) times daily. 06/22/22   Jerrlyn Morel, NP  cilostazol  (PLETAL ) 100 MG tablet Take 1 tablet (100 mg total) by mouth 2 (two) times daily before a meal. 09/03/22   Young Hensen, MD  Continuous Glucose Sensor (FREESTYLE LIBRE 3 SENSOR) MISC Place 1 sensor on the skin every 14 days. Use to check glucose continuously 03/24/23   Paseda, Folashade R, FNP  dicyclomine  (BENTYL ) 20 MG tablet Take 1 tablet (20 mg total) by mouth 2 (two) times daily as needed for spasms. 03/24/23   Small, Brooke L, PA  empagliflozin  (JARDIANCE ) 25 MG TABS tablet Take 1 tablet (25 mg total) by mouth daily before breakfast. 07/16/23   Jerrlyn Morel, NP  glucose blood test strip Use as instructed 06/22/22   Jerrlyn Morel, NP  ibuprofen  (ADVIL ) 800 MG tablet Take 1 tablet (800 mg total) by mouth every 8 (eight) hours as needed. 05/11/23   Jerrlyn Morel, NP  insulin  glargine (LANTUS  SOLOSTAR) 100 UNIT/ML Solostar Pen Inject 16 Units into the skin at bedtime. Patient not taking: Reported on 03/02/2023 01/07/23   Jerrlyn Morel, NP  Insulin  Pen Needle (PEN NEEDLES) 32G X 4 MM MISC Use to inject insulin  daily as directed. Patient not taking: Reported on 03/02/2023 10/07/22   Jerrlyn Morel, NP  lisinopril  (ZESTRIL ) 20 MG tablet Take 1 tablet (20 mg total) by mouth daily. 05/11/23   Jerrlyn Morel, NP  loperamide  (IMODIUM  A-D) 2 MG tablet Take 1 tablet (2 mg total) by mouth 4 (four) times daily as needed for diarrhea or loose stools. Patient not taking: Reported on 02/23/2023 01/22/23   Jerrlyn Morel, NP  metFORMIN  (GLUCOPHAGE -XR) 500 MG 24 hr tablet Take 1 tablet (500 mg total) by mouth 2 (two) times daily. 07/29/23   Jerrlyn Morel, NP  Semaglutide ,0.25 or 0.5MG /DOS, (OZEMPIC , 0.25 OR 0.5 MG/DOSE,) 2 MG/3ML SOPN Inject 0.5 mg into the skin once a week. Ok to fill early 05/18/23   Jerrlyn Morel, NP  Blood Glucose Monitoring Suppl (ACCU-CHEK GUIDE) w/Device KIT Use in the morning, at noon, in the evening, and at bedtime. Patient not taking: Reported on 06/22/2022 10/16/20   Gregoria Leas, NP  glucose blood (TRUE METRIX BLOOD GLUCOSE TEST) test strip Use as instructed 07/20/18   Emmie Harness, FNP  Lancets (FREESTYLE) lancets Use as instructed Patient not taking: Reported on 06/22/2022 01/03/15   Abrol, Nayana, MD    Physical Exam: Vitals:   08/29/23 0615 08/29/23 0700 08/29/23 0856 08/29/23 1030  BP: (!) 199/88 (!) 204/82 (!) 187/70 (!) 180/67  Pulse: 74 68 74 72  Resp: (!) 25 12 15 15   Temp:   97.7 F (36.5 C)   TempSrc:   Oral   SpO2: 100% 100% 100% 100%  Weight:      Height:       Physical Exam Vitals and nursing note reviewed.  Constitutional:      General: He is awake. He is not in acute distress.    Appearance: Normal appearance. He is normal weight. He is ill-appearing.  HENT:     Head: Normocephalic.     Nose: No rhinorrhea.     Mouth/Throat:     Mouth: Mucous membranes are moist.     Comments: Edema of left lower lip. Eyes:     General: No scleral icterus.    Pupils: Pupils are equal,  round, and reactive to light.  Neck:     Vascular: No JVD.  Cardiovascular:     Rate and Rhythm: Normal rate and  regular rhythm.     Heart sounds: S1 normal and S2 normal.  Pulmonary:     Breath sounds: Normal breath sounds. No wheezing, rhonchi or rales.  Abdominal:     General: Bowel sounds are normal. There is no distension.     Tenderness: There is abdominal tenderness. There is no right CVA tenderness, left CVA tenderness, guarding or rebound.  Musculoskeletal:     Cervical back: Neck supple.     Right lower leg: No edema.     Left lower leg: No edema.  Skin:    General: Skin is warm and dry.  Neurological:     General: No focal deficit present.     Mental Status: He is alert and oriented to person, place, and time.  Psychiatric:        Mood and Affect: Mood normal.        Behavior: Behavior normal. Behavior is cooperative.     Data Reviewed:  Results are pending, will review when available.  Assessment and Plan: Principal Problem:   Lactic acidosis Associated with:   Leukocytosis Presenting with:   Abdominal pain And:   Nausea and vomiting Admit to SDU/inpatient. Keep n.p.o. for now. Antiemetics as needed. Analgesics as needed. Continue IV fluids at a lower rate. The patient met sepsis criteria: Continue cefepime per pharmacy. Continue metronidazole  500 mg IVPB q 12 hr. Continue vancomycin  per pharmacy. Follow-up blood culture and sensitivity Follow lactic acid level. Follow CBC and CMP in a.m. Discussed with vascular surgery on-call. -Dr. Susi Eric check his imaging. -He does not believe he is having mesenteric ischemia. PCCM consulted (Dr. Alisia Apple) due to worsening lactic acid.  Active Problems:   Essential hypertension Presenting with:   Hypertensive urgency Pain and nausea control. Labetalol  and hydralazine  as needed.    Tobacco use Tobacco cessation advised. Nicotine  replacement therapy ordered.    Hyperlipidemia On atorvastatin .     History of CVA (cerebrovascular accident) Associated with:   Blind right eye Continue Pletal  and aspirin . Continue daily statin. Smoking cessation advised.    Anxiety and depression Currently not on therapy. No SI or HI. Follow-up with PCP or behavioral health as an outpatient.    Type 2 diabetes mellitus with hyperglycemia,  without long-term current use of insulin  (HCC) Dr. Fulton Job suspects metformin  might be source of lactic acidosis. He is currently n.p.o., but hyperglycemic. CBG monitoring every 6 hours with RI SS.    Atherosclerosis of native arteries of extremity with intermittent claudication (HCC) Seen 2+ months ago by his primary vascular surgeon Dr. Jimmye Moulds on 06/24/2023. ABI results done during that appointment: -Resting right ankle-brachial index indicated moderate right lower extremity arterial disease.   -The right toe brachial index was abnormal.   -Resting left ankle-brachial index indicates mild left lower extremity arterial disease. -The left toe brachial index was abnormal.      Advance Care Planning:   Code Status: Full Code   Consults: PCCM (Dr. Alisia Apple).  Family Communication:   Severity of Illness: The appropriate patient status for this patient is INPATIENT. Inpatient status is judged to be reasonable and necessary in order to provide the required intensity of service to ensure the patient's safety. The patient's presenting symptoms, physical exam findings, and initial radiographic and laboratory data in the context of their chronic comorbidities is felt to place them at high risk for further clinical deterioration. Furthermore, it is not anticipated that the patient will be medically stable for discharge from the hospital within 2 midnights  of admission.   * I certify that at the point of admission it is my clinical judgment that the patient will require inpatient hospital care spanning beyond 2 midnights from the point of admission due to  high intensity of service, high risk for further deterioration and high frequency of surveillance required.*  Author: Danice Dural, MD 08/29/2023 11:16 AM  For on call review [email or web address].   This document was prepared using Dragon voice recognition software and may contain some unintended transcription errors.

## 2023-08-30 ENCOUNTER — Inpatient Hospital Stay (HOSPITAL_COMMUNITY)

## 2023-08-30 DIAGNOSIS — R008 Other abnormalities of heart beat: Secondary | ICD-10-CM | POA: Diagnosis not present

## 2023-08-30 DIAGNOSIS — E1165 Type 2 diabetes mellitus with hyperglycemia: Secondary | ICD-10-CM

## 2023-08-30 DIAGNOSIS — E872 Acidosis, unspecified: Secondary | ICD-10-CM | POA: Diagnosis not present

## 2023-08-30 DIAGNOSIS — I10 Essential (primary) hypertension: Secondary | ICD-10-CM | POA: Diagnosis not present

## 2023-08-30 LAB — CBC
HCT: 39.8 % (ref 39.0–52.0)
Hemoglobin: 12.3 g/dL — ABNORMAL LOW (ref 13.0–17.0)
MCH: 20 pg — ABNORMAL LOW (ref 26.0–34.0)
MCHC: 30.9 g/dL (ref 30.0–36.0)
MCV: 64.7 fL — ABNORMAL LOW (ref 80.0–100.0)
Platelets: 220 10*3/uL (ref 150–400)
RBC: 6.15 MIL/uL — ABNORMAL HIGH (ref 4.22–5.81)
RDW: 18.8 % — ABNORMAL HIGH (ref 11.5–15.5)
WBC: 19.7 10*3/uL — ABNORMAL HIGH (ref 4.0–10.5)
nRBC: 0 % (ref 0.0–0.2)

## 2023-08-30 LAB — COMPREHENSIVE METABOLIC PANEL WITH GFR
ALT: 18 U/L (ref 0–44)
AST: 22 U/L (ref 15–41)
Albumin: 3.6 g/dL (ref 3.5–5.0)
Alkaline Phosphatase: 58 U/L (ref 38–126)
Anion gap: 10 (ref 5–15)
BUN: 10 mg/dL (ref 6–20)
CO2: 24 mmol/L (ref 22–32)
Calcium: 8.3 mg/dL — ABNORMAL LOW (ref 8.9–10.3)
Chloride: 100 mmol/L (ref 98–111)
Creatinine, Ser: 0.78 mg/dL (ref 0.61–1.24)
GFR, Estimated: >60 mL/min (ref >60)
Glucose, Bld: 181 mg/dL — ABNORMAL HIGH (ref 70–99)
Potassium: 3.5 mmol/L (ref 3.5–5.1)
Sodium: 134 mmol/L — ABNORMAL LOW (ref 135–145)
Total Bilirubin: 0.7 mg/dL (ref 0.0–1.2)
Total Protein: 6.2 g/dL — ABNORMAL LOW (ref 6.5–8.1)

## 2023-08-30 LAB — HIV ANTIBODY (ROUTINE TESTING W REFLEX): HIV Screen 4th Generation wRfx: NONREACTIVE

## 2023-08-30 LAB — ECHOCARDIOGRAM COMPLETE
AR max vel: 2.68 cm2
AV Peak grad: 13.4 mmHg
Ao pk vel: 1.83 m/s
Area-P 1/2: 3.53 cm2
Height: 70 in
MV VTI: 2.65 cm2
S' Lateral: 2.4 cm
Weight: 2560 [oz_av]

## 2023-08-30 LAB — TRIGLYCERIDES: Triglycerides: 380 mg/dL — ABNORMAL HIGH (ref <150)

## 2023-08-30 LAB — GLUCOSE, CAPILLARY
Glucose-Capillary: 136 mg/dL — ABNORMAL HIGH (ref 70–99)
Glucose-Capillary: 141 mg/dL — ABNORMAL HIGH (ref 70–99)
Glucose-Capillary: 150 mg/dL — ABNORMAL HIGH (ref 70–99)
Glucose-Capillary: 163 mg/dL — ABNORMAL HIGH (ref 70–99)
Glucose-Capillary: 166 mg/dL — ABNORMAL HIGH (ref 70–99)
Glucose-Capillary: 173 mg/dL — ABNORMAL HIGH (ref 70–99)

## 2023-08-30 LAB — BRAIN NATRIURETIC PEPTIDE: B Natriuretic Peptide: 225.7 pg/mL — ABNORMAL HIGH (ref 0.0–100.0)

## 2023-08-30 LAB — LACTIC ACID, PLASMA: Lactic Acid, Venous: 3 mmol/L (ref 0.5–1.9)

## 2023-08-30 MED ORDER — POTASSIUM CHLORIDE 20 MEQ PO PACK
60.0000 meq | PACK | Freq: Two times a day (BID) | ORAL | Status: DC
  Filled 2023-08-30: qty 3

## 2023-08-30 MED ORDER — LISINOPRIL 10 MG PO TABS
20.0000 mg | ORAL_TABLET | Freq: Every day | ORAL | Status: DC
  Administered 2023-08-30: 20 mg via ORAL
  Filled 2023-08-30: qty 2

## 2023-08-30 MED ORDER — LABETALOL HCL 5 MG/ML IV SOLN
10.0000 mg | INTRAVENOUS | Status: DC | PRN
  Administered 2023-08-31: 10 mg via INTRAVENOUS
  Filled 2023-08-30: qty 4

## 2023-08-30 MED ORDER — POTASSIUM CHLORIDE 10 MEQ/100ML IV SOLN
10.0000 meq | INTRAVENOUS | Status: AC
  Administered 2023-08-30 (×6): 10 meq via INTRAVENOUS
  Filled 2023-08-30 (×6): qty 100

## 2023-08-30 MED ORDER — FUROSEMIDE 10 MG/ML IJ SOLN
40.0000 mg | Freq: Two times a day (BID) | INTRAMUSCULAR | Status: DC
  Administered 2023-08-30: 40 mg via INTRAVENOUS
  Filled 2023-08-30: qty 4

## 2023-08-30 MED ORDER — LACTATED RINGERS IV BOLUS
1000.0000 mL | Freq: Once | INTRAVENOUS | Status: AC
  Administered 2023-08-30: 1000 mL via INTRAVENOUS

## 2023-08-30 NOTE — Progress Notes (Signed)
 Dr. Bertrum Brodie notified me that patient felt nauseous. Asked patient if they wanted antiemetic. Patient refused. Patient stated " I havent eaten in days. Im taking all these drugs and the medications is making it worse". Patient educated on antiemetic. Patient still refused. MD notified. Vomited x1. Still wants to hold off on antiemetic.

## 2023-08-30 NOTE — Progress Notes (Signed)
 NAME:  Frank Schneider, MRN:  [id number], DOB:  11-24-1975, LOS: 1 ADMISSION DATE:  08/29/2023, CONSULTATION DATE: 5/4 REFERRING MD: Mauricia South, CHIEF COMPLAINT: Lactic acidosis  History of Present Illness:  Frank Schneider is a 48 year old gentleman with a history of tobacco abuse, peripheral artery disease, diabetes on insulin  who presented to the hospital with 3 hours of nausea and vomiting.  He reports history of intermittent compliance with his insulin  and Ozempic .  He is no longer prescribed metformin , although it is still listed in his chart.  He reports that he took his insulin  yesterday.  Last night he had some alcohol  and pork skins, which he says is unusual for him.  He reported in the ED that some of his emesis had blood streaks.  Per ED note, no chest pain or shortness of breath, headaches, dysuria.  Blood sugar with EMS was 281.  In the emergency department he was found to have lactic acidosis and was started on broad-spectrum antibiotics-cefepime and vancomycin .  Due to increasing lactic acid level despite volume resuscitation of 2 L LR bolus PCCM was consulted.  Per nurses in the ICU antiemetics transiently helped but he has had several episodes of vomiting since being here.  Systolic blood pressure in the 200s.  Pertinent  Medical History  Diabetes on insulin  Hypertension History of stroke Peripheral artery disease Vitamin D  deficiency Tobacco abuse  Significant Hospital Events: Including procedures, antibiotic start and stop dates in addition to other pertinent events   5/4 admitted, 2 L bolus, cefepime vancomycin  started 5/5 PCCM taking over as primary. Adding PO antihypertensives, lasix   Interim History / Subjective:   On 21 cleviprex  Awake and hungry this morning    MRAS PCR neg  LA down to 3  Objective   Blood pressure (!) 171/60, pulse 90, temperature 98.9 F (37.2 C), temperature source Axillary, resp. rate 17, height 5\' 10"  (1.778 m), weight 72.6 kg, SpO2 97%.         Intake/Output Summary (Last 24 hours) at 08/30/2023 1036 Last data filed at 08/30/2023 1031 Gross per 24 hour  Intake 6425.58 ml  Output 3105 ml  Net 3320.58 ml   Filed Weights   08/29/23 0607  Weight: 72.6 kg    Examination: General: Ill appearing middle aged M  HENT: chronic R eye defect. Anicteric sclera pink tacky mm  Lungs: CTAb on  RA  Cardiovascular: rrr s1s2 no rgm  Abdomen:  soft, generalized discomfort no localized pain  Extremities: No obvious acute joint deformity  Neuro: AAOx4 following commands  Derm:  c/d/w no obvious rash   Resolved Hospital Problem list     Assessment & Plan:   Poorly controlled HTN  P -give dose of IV lasix -starting home lisinopril , cont norvasc  -wean cleviprex for goal < 170 -- ICU status because of this  -PRN hydral   Suspected sepsis, presumed intraabd source  N/V ?Gastroparesis r/o ozempic   Lactic acidosis, improving  -not on metformin , needs to be taken off home emds list -exam and imaging not c/w mesenteric ischemia  P -clears as tolerated -PRN antiemetics -dc vanc -- MRSA PCR neg -cont flagyl , cefepime for now   Borderline hypoK -replace since we are diuresing 5/5   DM w hyperglycemia  P -cont semglee  12 u and SSI  -hold home meds  Intermittent claudication -pletal    Best Practice (right click and "Reselect all SmartList Selections" daily)   Diet/type: clear liquids DVT prophylaxis SCD lovenox   Pressure ulcer(s): N/A GI prophylaxis: N/A  Lines: N/A Foley:  N/A Code Status:  full code Last date of multidisciplinary goals of care discussion [ ]   Labs   CBC: Recent Labs  Lab 08/29/23 0542 08/29/23 1448 08/30/23 0318  WBC 21.1* 23.4* 19.7*  NEUTROABS 18.0* 20.4*  --   HGB 12.7* 12.3* 12.3*  HCT 41.7 40.4 39.8  MCV 63.7* 63.5* 64.7*  PLT 221 235 220    Basic Metabolic Panel: Recent Labs  Lab 08/29/23 0542 08/29/23 1448 08/30/23 0318  NA 138 136 134*  K 4.3 4.1 3.5  CL 103 99 100  CO2  25 25 24   GLUCOSE 279* 276* 181*  BUN 15 12 10   CREATININE 0.83 0.86 0.78  CALCIUM  9.2 8.9 8.3*   GFR: Estimated Creatinine Clearance: 117.2 mL/min (by C-G formula based on SCr of 0.78 mg/dL). Recent Labs  Lab 08/29/23 0542 08/29/23 0742 08/29/23 1448 08/29/23 1725 08/29/23 2144 08/30/23 0318  WBC 21.1*  --  23.4*  --   --  19.7*  LATICACIDVEN 4.0*   < > 6.5* 6.5* 4.6* 3.0*   < > = values in this interval not displayed.    Liver Function Tests: Recent Labs  Lab 08/29/23 0542 08/29/23 1448 08/30/23 0318  AST 26 30 22   ALT 25 23 18   ALKPHOS 66 63 58  BILITOT 0.7 0.9 0.7  PROT 7.5 7.2 6.2*  ALBUMIN 4.3 4.0 3.6   Recent Labs  Lab 08/29/23 0542  LIPASE 26   No results for input(s): "AMMONIA" in the last 168 hours.  ABG    Component Value Date/Time   PHART 7.42 08/29/2023 1617   PCO2ART 37 08/29/2023 1617   PO2ART 70 (L) 08/29/2023 1617   HCO3 24.0 08/29/2023 1617   TCO2 28 04/29/2018 1612   ACIDBASEDEF 0.2 08/29/2023 1617   O2SAT 97 08/29/2023 1617     Coagulation Profile: No results for input(s): "INR", "PROTIME" in the last 168 hours.  Cardiac Enzymes: No results for input(s): "CKTOTAL", "CKMB", "CKMBINDEX", "TROPONINI" in the last 168 hours.  HbA1C: Hemoglobin A1C  Date/Time Value Ref Range Status  01/07/2023 04:02 PM 7.4 (A) 4.0 - 5.6 % Final  10/07/2022 03:15 PM 7.5 (A) 4.0 - 5.6 % Final   HbA1c, POC (prediabetic range)  Date/Time Value Ref Range Status  01/30/2022 01:51 PM 8.7 (A) 5.7 - 6.4 % Final  10/30/2021 02:09 PM 9.9 (A) 5.7 - 6.4 % Final   HbA1c, POC (controlled diabetic range)  Date/Time Value Ref Range Status  01/30/2022 01:51 PM 8.7 (A) 0.0 - 7.0 % Final  10/30/2021 02:09 PM 9.9 (A) 0.0 - 7.0 % Final   HbA1c POC (<> result, manual entry)  Date/Time Value Ref Range Status  01/30/2022 01:51 PM 8.7 4.0 - 5.6 % Final  10/30/2021 02:09 PM 9.9 4.0 - 5.6 % Final   Hgb A1c MFr Bld  Date/Time Value Ref Range Status  08/29/2023 02:48  PM 9.0 (H) 4.8 - 5.6 % Final    Comment:    (NOTE) Pre diabetes:          5.7%-6.4%  Diabetes:              >6.4%  Glycemic control for   <7.0% adults with diabetes   06/22/2022 02:40 PM 10.0 (H) 4.8 - 5.6 % Final    Comment:             Prediabetes: 5.7 - 6.4          Diabetes: >6.4  Glycemic control for adults with diabetes: <7.0     CBG: Recent Labs  Lab 08/29/23 1615 08/29/23 1959 08/29/23 2327 08/30/23 0340 08/30/23 0756  GLUCAP 299* 226* 223* 173* 166*    CRITICAL CARE Performed by: Delories Fetter   Total critical care time: 38 minutes  Critical care time was exclusive of separately billable procedures and treating other patients. Critical care was necessary to treat or prevent imminent or life-threatening deterioration.  Critical care was time spent personally by me on the following activities: development of treatment plan with patient and/or surrogate as well as nursing, discussions with consultants, evaluation of patient's response to treatment, examination of patient, obtaining history from patient or surrogate, ordering and performing treatments and interventions, ordering and review of laboratory studies, ordering and review of radiographic studies, pulse oximetry and re-evaluation of patient's condition.  Eston Hence MSN, AGACNP-BC [facility] Amion for pager 08/30/2023, 10:36 AM

## 2023-08-30 NOTE — Plan of Care (Signed)
   Problem: Education: Goal: Knowledge of General Education information will improve Description: Including pain rating scale, medication(s)/side effects and non-pharmacologic comfort measures Outcome: Progressing   Problem: Coping: Goal: Level of anxiety will decrease Outcome: Progressing   Problem: Elimination: Goal: Will not experience complications related to urinary retention Outcome: Progressing   Problem: Pain Managment: Goal: General experience of comfort will improve and/or be controlled Outcome: Progressing   Problem: Safety: Goal: Ability to remain free from injury will improve Outcome: Progressing   Problem: Skin Integrity: Goal: Risk for impaired skin integrity will decrease Outcome: Progressing

## 2023-08-30 NOTE — Progress Notes (Incomplete)
 PROGRESS NOTE  Frank Schneider  DOB: February 09, 1976  PCP: Jerrlyn Morel, NP WUJ:[id number]  DOA: 08/29/2023  LOS: 1 day  Hospital Day: 2  Brief narrative: Frank Schneider is a 48 y.o. male with PMH significant for DM2 on insulin  and Ozempic , HTN, chronic smoking, CVA with residual deficit, peripheral neuropathy, PAD*** 5/4, patient presented to the ED with complaint of nausea vomiting for 3 hours. Patient reports the night before he took some alcohol  and pork skins which is unusual for him. He then developed to have multiple episodes of vomiting and also noted some bloody streak but no gross vomiting of blood.  In the ED, patient was afebrile, hemodynamically stable  Initial labs with WBC count 21.1, lactic acid elevated to 4, blood glucose elevated to 279, ketones level not elevated Urinalysis with clear colorless urine with negative ketones, no bacteria Chest x-ray unremarkable CT abdomen and pelvis unremarkable Blood culture was sent Blood gas with normal pH Urine drug screen positive for cocaine Respiratory virus panel unremarkable  Patient was started on IV fluid, broad-spectrum IV antibiotics Admitted to [facility]  Subjective: Patient was seen and examined *** Chart reviewed Overnight, blood pressure elevated to 170s and 180s   Assessment and plan: Intractable nausea, vomiting Reportedly started after eating food that is unusual to him CT abdomen and pelvis unremarkable Not sure if this is really related to food or could be Ozempic  related. Symptomatic management with IV hydration, IV antiemetics Currently n.p.o.***  Leukocytosis Lactic acidosis No clear evidence of infection but patient had significant leukocytosis and significantly elevated lactic acid level. No significant abdominal pain to suggest mesenteric ischemia. Blood culture was sent and patient was started on empiric antibiotics Continue IV hydration WBC count and lactic acid seem to be downtrending in a.m.  labs today.  Continue to monitor*** Recent Labs  Lab 08/29/23 0542 08/29/23 0742 08/29/23 1302 08/29/23 1448 08/29/23 1725 08/29/23 2144 08/30/23 0318  WBC 21.1*  --   --  23.4*  --   --  19.7*  LATICACIDVEN 4.0*   < > 5.8* 6.5* 6.5* 4.6* 3.0*   < > = values in this interval not displayed.   Type 2 diabetes mellitus uncontrolled with hyperglycemia A1c 9 on 08/29/2023 PTA meds-insulin , Ozempic .  Not on metformin   Currently on Lantus  12 units daily*** SSI/Accu-Cheks Recent Labs  Lab 08/29/23 1615 08/29/23 1959 08/29/23 2327 08/30/23 0340 08/30/23 0756  GLUCAP 299* 226* 223* 173* 166*      Lactic acidosis Associated with:   Leukocytosis Presenting with:   Abdominal pain And:   Nausea and vomiting Admit to SDU/inpatient. Keep n.p.o. for now. Antiemetics as needed. Analgesics as needed. Continue IV fluids at a lower rate. The patient met sepsis criteria: Continue cefepime per pharmacy. Continue metronidazole  500 mg IVPB q 12 hr. Continue vancomycin  per pharmacy. Follow-up blood culture and sensitivity Follow lactic acid level. Follow CBC and CMP in a.m. Discussed with vascular surgery on-call. -Dr. Susi Eric check his imaging. -He does not believe he is having mesenteric ischemia. PCCM consulted (Dr. Alisia Apple) due to worsening lactic acid.   Active Problems:   Essential hypertension Presenting with:   Hypertensive urgency Pain and nausea control. Labetalol  and hydralazine  as needed.     Tobacco use Tobacco cessation advised. Nicotine  replacement therapy ordered.     Hyperlipidemia On atorvastatin .     History of CVA (cerebrovascular accident) Associated with:   Blind right eye Continue Pletal  and aspirin . Continue daily statin. Smoking cessation advised.  Anxiety and depression Currently not on therapy. No SI or HI. Follow-up with PCP or behavioral health as an outpatient.     Type 2 diabetes mellitus with hyperglycemia,  without long-term  current use of insulin  (HCC) Dr. Fulton Job suspects metformin  might be source of lactic acidosis. He is currently n.p.o., but hyperglycemic. CBG monitoring every 6 hours with RI SS.     Atherosclerosis of native arteries of extremity with intermittent claudication (HCC) Seen 2+ months ago by his primary vascular surgeon Dr. Jimmye Moulds on 06/24/2023. ABI results done during that appointment: -Resting right ankle-brachial index indicated moderate right lower extremity arterial disease.   -The right toe brachial index was abnormal.   -Resting left ankle-brachial index indicates mild left lower extremity arterial disease. -The left toe brachial index was abnormal.         Mobility: ***  Goals of care   Code Status: Full Code  ***   DVT prophylaxis: *** enoxaparin  (LOVENOX ) injection 40 mg Start: 08/29/23 2200 Place and maintain sequential compression device Start: 08/29/23 1452   Antimicrobials: *** Fluid: *** Consultants: *** Family Communication: ***  Status: *** Level of care:  Stepdown   Patient is from: *** Needs to continue in-hospital care: *** Anticipated d/c to: ***      Diet: *** Diet Order             Diet NPO time specified  Diet effective now                   Scheduled Meds:  amLODipine   10 mg Oral Daily   atorvastatin   40 mg Oral Daily   Chlorhexidine  Gluconate Cloth  6 each Topical Daily   cilostazol   100 mg Oral BID AC   enoxaparin  (LOVENOX ) injection  40 mg Subcutaneous Q24H   insulin  aspart  2-6 Units Subcutaneous Q4H   insulin  glargine-yfgn  12 Units Subcutaneous Daily    PRN meds: acetaminophen  **OR** acetaminophen , fentaNYL  (SUBLIMAZE ) injection, hydrALAZINE , ondansetron  **OR** ondansetron  (ZOFRAN ) IV, mouth rinse, prochlorperazine    Infusions:   ceFEPime (MAXIPIME) IV Stopped (08/30/23 0441)   clevidipine 21 mg/hr (08/30/23 0714)   metronidazole  Stopped (08/29/23 2251)   thiamine (VITAMIN B1) injection Stopped (08/30/23  8295)   vancomycin  Stopped (08/30/23 0800)    Antimicrobials: Anti-infectives (From admission, onward)    Start     Dose/Rate Route Frequency Ordered Stop   08/29/23 2200  metroNIDAZOLE  (FLAGYL ) IVPB 500 mg        500 mg 100 mL/hr over 60 Minutes Intravenous Every 12 hours 08/29/23 1342 09/05/23 2159   08/29/23 2000  ceFEPIme (MAXIPIME) 2 g in sodium chloride  0.9 % 100 mL IVPB        2 g 200 mL/hr over 30 Minutes Intravenous Every 8 hours 08/29/23 1357     08/29/23 1815  Vancomycin  (VANCOCIN ) 1,250 mg in sodium chloride  0.9 % 250 mL IVPB        1,250 mg 166.7 mL/hr over 90 Minutes Intravenous 2 times daily 08/29/23 1724     08/29/23 1800  vancomycin  (VANCOREADY) IVPB 1250 mg/250 mL  Status:  Discontinued        1,250 mg 166.7 mL/hr over 90 Minutes Intravenous Every 12 hours 08/29/23 1357 08/29/23 1720   08/29/23 1800  vancomycin  (VANCOREADY) IVPB 1250 mg/250 mL  Status:  Discontinued        1,250 mg 166.7 mL/hr over 90 Minutes Intravenous 2 times daily 08/29/23 1720 08/29/23 1724   08/29/23 1045  ceFEPIme (MAXIPIME) 2  g in sodium chloride  0.9 % 100 mL IVPB        2 g 200 mL/hr over 30 Minutes Intravenous  Once 08/29/23 1031 08/29/23 1120   08/29/23 1045  metroNIDAZOLE  (FLAGYL ) IVPB 500 mg        500 mg 100 mL/hr over 60 Minutes Intravenous  Once 08/29/23 1031 08/29/23 1218   08/29/23 1045  vancomycin  (VANCOCIN ) IVPB 1000 mg/200 mL premix        1,000 mg 200 mL/hr over 60 Minutes Intravenous  Once 08/29/23 1031 08/29/23 1343       Objective: Vitals:   08/30/23 0646 08/30/23 0700  BP: (!) 189/55 (!) 170/53  Pulse: 82 80  Resp: 16 12  Temp:  98.9 F (37.2 C)  SpO2: 94% 96%    Intake/Output Summary (Last 24 hours) at 08/30/2023 0814 Last data filed at 08/30/2023 0651 Gross per 24 hour  Intake 6127.31 ml  Output 1105 ml  Net 5022.31 ml   Filed Weights   08/29/23 0607  Weight: 72.6 kg   Weight change:  Body mass index is 22.96 kg/m.   Physical Exam: General exam:  Pleasant, *** Skin: No rashes, lesions or ulcers. HEENT: Atraumatic, normocephalic, no obvious bleeding Lungs: Clear to auscultation bilaterally, *** CVS: S1, S2, no murmur,  *** GI/Abd: Soft, nontender, nondistended, bowel sound present,  *** CNS: *** Psychiatry: Mood appropriate, *** Extremities: No pedal edema, no calf tenderness, ***  Data Review: I have personally reviewed the laboratory data and studies available.  F/u labs *** Unresulted Labs (From admission, onward)     Start     Ordered   08/30/23 0803  Triglycerides  Add-on,   AD       Question:  Specimen collection method  Answer:  Lab=Lab collect   08/30/23 0802   08/30/23 0500  HIV Antibody (routine testing w rflx)  (HIV Antibody (Routine testing w reflex) panel)  Tomorrow morning,   R        08/29/23 1208   08/29/23 1031  Culture, blood (Routine X 2) w Reflex to ID Panel  BLOOD CULTURE X 2,   R (with STAT occurrences)     Question:  Patient immune status  Answer:  Normal   08/29/23 1030            Admission date and time: 08/29/2023  5:24 AM    Signed, Hoyt Macleod, MD [facility] 08/30/2023

## 2023-08-30 NOTE — Inpatient Diabetes Management (Signed)
 Inpatient Diabetes Program Recommendations  AACE/ADA: New Consensus Statement on Inpatient Glycemic Control (2015)  Target Ranges:  Prepandial:   less than 140 mg/dL      Peak postprandial:   less than 180 mg/dL (1-2 hours)      Critically ill patients:  140 - 180 mg/dL   Lab Results  Component Value Date   GLUCAP 136 (H) 08/30/2023   HGBA1C 9.0 (H) 08/29/2023    Review of Glycemic Control  Diabetes history: DM2 Outpatient Diabetes medications: Lantus  40 at bedtime, Jardiance  25 daily, Ozempic  0.5 mg weekly, metformin  500 mg BID (not taking) Current orders for Inpatient glycemic control: Semglee  12 daily, Novolog  2-6 units Q4H  HgbA1C - 9.0% CBGs 173, 166, 136 today  Inpatient Diabetes Program Recommendations:    Spoke with pt at bedside regarding his diabetes. Pt states he's still nauseated and doesn't feel like talking.   Discussed with RN.   Will f/u in am.   Thank you. Joni Net, RD, LDN, CDCES Inpatient Diabetes Coordinator [phone]

## 2023-08-30 NOTE — Progress Notes (Signed)
   08/30/23 1001  TOC Brief Assessment  Insurance and Status Reviewed  Patient has primary care physician Yes  Home environment has been reviewed home w/ roommates/friends  Prior level of function: independent  Prior/Current Home Services No current home services  Social Drivers of Health Review SDOH reviewed no interventions necessary  Readmission risk has been reviewed Yes  Transition of care needs no transition of care needs at this time

## 2023-08-30 NOTE — Plan of Care (Signed)
  Problem: Education: Goal: Knowledge of General Education information will improve Description: Including pain rating scale, medication(s)/side effects and non-pharmacologic comfort measures Outcome: Progressing   Problem: Health Behavior/Discharge Planning: Goal: Ability to manage health-related needs will improve Outcome: Progressing   Problem: Clinical Measurements: Goal: Ability to maintain clinical measurements within normal limits will improve Outcome: Progressing Goal: Will remain free from infection Outcome: Progressing Goal: Diagnostic test results will improve Outcome: Progressing Goal: Respiratory complications will improve Outcome: Progressing Goal: Cardiovascular complication will be avoided Outcome: Progressing   Problem: Activity: Goal: Risk for activity intolerance will decrease Outcome: Progressing   Problem: Coping: Goal: Level of anxiety will decrease Outcome: Progressing   Problem: Elimination: Goal: Will not experience complications related to bowel motility Outcome: Progressing Goal: Will not experience complications related to urinary retention Outcome: Progressing   Problem: Pain Managment: Goal: General experience of comfort will improve and/or be controlled Outcome: Progressing   Problem: Safety: Goal: Ability to remain free from injury will improve Outcome: Progressing   Problem: Skin Integrity: Goal: Risk for impaired skin integrity will decrease Outcome: Progressing   Problem: Fluid Volume: Goal: Hemodynamic stability will improve Outcome: Progressing   Problem: Clinical Measurements: Goal: Diagnostic test results will improve Outcome: Progressing Goal: Signs and symptoms of infection will decrease Outcome: Progressing   Problem: Respiratory: Goal: Ability to maintain adequate ventilation will improve Outcome: Progressing   Problem: Education: Goal: Ability to describe self-care measures that may prevent or decrease  complications (Diabetes Survival Skills Education) will improve Outcome: Progressing Goal: Individualized Educational Video(s) Outcome: Progressing   Problem: Coping: Goal: Ability to adjust to condition or change in health will improve Outcome: Progressing   Problem: Fluid Volume: Goal: Ability to maintain a balanced intake and output will improve Outcome: Progressing   Problem: Health Behavior/Discharge Planning: Goal: Ability to identify and utilize available resources and services will improve Outcome: Progressing Goal: Ability to manage health-related needs will improve Outcome: Progressing   Problem: Metabolic: Goal: Ability to maintain appropriate glucose levels will improve Outcome: Progressing   Problem: Skin Integrity: Goal: Risk for impaired skin integrity will decrease Outcome: Progressing   Problem: Tissue Perfusion: Goal: Adequacy of tissue perfusion will improve Outcome: Progressing

## 2023-08-31 ENCOUNTER — Other Ambulatory Visit: Payer: Self-pay

## 2023-08-31 ENCOUNTER — Ambulatory Visit: Payer: Self-pay | Admitting: Nurse Practitioner

## 2023-08-31 ENCOUNTER — Telehealth: Payer: Self-pay

## 2023-08-31 DIAGNOSIS — E872 Acidosis, unspecified: Secondary | ICD-10-CM | POA: Diagnosis not present

## 2023-08-31 DIAGNOSIS — I10 Essential (primary) hypertension: Secondary | ICD-10-CM | POA: Diagnosis not present

## 2023-08-31 DIAGNOSIS — E1165 Type 2 diabetes mellitus with hyperglycemia: Secondary | ICD-10-CM | POA: Diagnosis not present

## 2023-08-31 LAB — HEMOGLOBIN A1C
Hgb A1c MFr Bld: 8.9 % — ABNORMAL HIGH (ref 4.8–5.6)
Mean Plasma Glucose: 208.73 mg/dL

## 2023-08-31 LAB — COMPREHENSIVE METABOLIC PANEL WITH GFR
ALT: 18 U/L (ref 0–44)
AST: 24 U/L (ref 15–41)
Albumin: 3.7 g/dL (ref 3.5–5.0)
Alkaline Phosphatase: 56 U/L (ref 38–126)
Anion gap: 9 (ref 5–15)
BUN: 7 mg/dL (ref 6–20)
CO2: 29 mmol/L (ref 22–32)
Calcium: 8.9 mg/dL (ref 8.9–10.3)
Chloride: 103 mmol/L (ref 98–111)
Creatinine, Ser: 0.59 mg/dL — ABNORMAL LOW (ref 0.61–1.24)
GFR, Estimated: >60 mL/min (ref >60)
Glucose, Bld: 117 mg/dL — ABNORMAL HIGH (ref 70–99)
Potassium: 3.6 mmol/L (ref 3.5–5.1)
Sodium: 141 mmol/L (ref 135–145)
Total Bilirubin: 0.8 mg/dL (ref 0.0–1.2)
Total Protein: 6.8 g/dL (ref 6.5–8.1)

## 2023-08-31 LAB — MAGNESIUM: Magnesium: 2 mg/dL (ref 1.7–2.4)

## 2023-08-31 LAB — CBC
HCT: 40.7 % (ref 39.0–52.0)
Hemoglobin: 12.5 g/dL — ABNORMAL LOW (ref 13.0–17.0)
MCH: 19.4 pg — ABNORMAL LOW (ref 26.0–34.0)
MCHC: 30.7 g/dL (ref 30.0–36.0)
MCV: 63.3 fL — ABNORMAL LOW (ref 80.0–100.0)
Platelets: 196 10*3/uL (ref 150–400)
RBC: 6.43 MIL/uL — ABNORMAL HIGH (ref 4.22–5.81)
RDW: 19.1 % — ABNORMAL HIGH (ref 11.5–15.5)
WBC: 14.8 10*3/uL — ABNORMAL HIGH (ref 4.0–10.5)
nRBC: 0 % (ref 0.0–0.2)

## 2023-08-31 LAB — GLUCOSE, CAPILLARY
Glucose-Capillary: 112 mg/dL — ABNORMAL HIGH (ref 70–99)
Glucose-Capillary: 173 mg/dL — ABNORMAL HIGH (ref 70–99)
Glucose-Capillary: 226 mg/dL — ABNORMAL HIGH (ref 70–99)
Glucose-Capillary: 131 mg/dL — ABNORMAL HIGH (ref 70–99)
Glucose-Capillary: 101 mg/dL — ABNORMAL HIGH (ref 70–99)
Glucose-Capillary: 178 mg/dL — ABNORMAL HIGH (ref 70–99)

## 2023-08-31 LAB — PHOSPHORUS: Phosphorus: 3.5 mg/dL (ref 2.5–4.6)

## 2023-08-31 LAB — LACTIC ACID, PLASMA: Lactic Acid, Venous: 1.3 mmol/L (ref 0.5–1.9)

## 2023-08-31 LAB — CK TOTAL AND CKMB (NOT AT ARMC)
CK, MB: 4.1 ng/mL (ref 0.5–5.0)
Total CK: 338 U/L (ref 49–397)

## 2023-08-31 LAB — PROCALCITONIN: Procalcitonin: 0.21 ng/mL

## 2023-08-31 MED ORDER — FUROSEMIDE 10 MG/ML IJ SOLN
20.0000 mg | Freq: Once | INTRAMUSCULAR | Status: AC
  Administered 2023-08-31: 20 mg via INTRAVENOUS
  Filled 2023-08-31: qty 2

## 2023-08-31 MED ORDER — ASPIRIN 325 MG PO TABS
325.0000 mg | ORAL_TABLET | Freq: Every day | ORAL | Status: DC
  Administered 2023-08-31 – 2023-09-03 (×4): 325 mg via ORAL
  Filled 2023-08-31 (×5): qty 1

## 2023-08-31 MED ORDER — LISINOPRIL 20 MG PO TABS
40.0000 mg | ORAL_TABLET | Freq: Every day | ORAL | Status: DC
  Administered 2023-08-31 – 2023-09-03 (×4): 40 mg via ORAL
  Filled 2023-08-31 (×2): qty 4
  Filled 2023-08-31: qty 2
  Filled 2023-08-31: qty 4

## 2023-08-31 MED ORDER — HYDRALAZINE HCL 20 MG/ML IJ SOLN
20.0000 mg | INTRAMUSCULAR | Status: DC | PRN
  Administered 2023-08-31: 20 mg via INTRAVENOUS
  Filled 2023-08-31: qty 1

## 2023-08-31 MED ORDER — CARVEDILOL 6.25 MG PO TABS
6.2500 mg | ORAL_TABLET | Freq: Two times a day (BID) | ORAL | Status: DC
  Administered 2023-08-31 (×2): 6.25 mg via ORAL
  Filled 2023-08-31 (×2): qty 1

## 2023-08-31 MED ORDER — POTASSIUM CHLORIDE CRYS ER 20 MEQ PO TBCR
20.0000 meq | EXTENDED_RELEASE_TABLET | Freq: Once | ORAL | Status: AC
  Administered 2023-08-31: 20 meq via ORAL
  Filled 2023-08-31: qty 1

## 2023-08-31 MED ORDER — PANTOPRAZOLE SODIUM 40 MG PO TBEC
40.0000 mg | DELAYED_RELEASE_TABLET | Freq: Every day | ORAL | Status: DC
  Administered 2023-08-31 – 2023-09-02 (×3): 40 mg via ORAL
  Filled 2023-08-31 (×2): qty 1

## 2023-08-31 MED ORDER — FUROSEMIDE 10 MG/ML IJ SOLN
40.0000 mg | Freq: Once | INTRAMUSCULAR | Status: AC
Start: 2023-08-31 — End: 2023-08-31
  Administered 2023-08-31: 40 mg via INTRAVENOUS
  Filled 2023-08-31: qty 4

## 2023-08-31 MED ORDER — POTASSIUM CHLORIDE 10 MEQ/100ML IV SOLN
10.0000 meq | INTRAVENOUS | Status: AC
  Administered 2023-08-31 (×4): 10 meq via INTRAVENOUS
  Filled 2023-08-31 (×4): qty 100

## 2023-08-31 MED FILL — Metformin HCl Tab ER 24HR 500 MG: ORAL | 30 days supply | Qty: 60 | Fill #1 | Status: CN

## 2023-08-31 NOTE — Progress Notes (Addendum)
 NAME:  Frank Schneider, MRN:  [id number], DOB:  1976-04-06, LOS: 2 ADMISSION DATE:  08/29/2023, CONSULTATION DATE: 5/4 REFERRING MD: Frank Schneider, CHIEF COMPLAINT: Lactic acidosis  History of Present Illness:  [name] is a 48 year old gentleman with a history of tobacco abuse, peripheral artery disease, diabetes on insulin  who presented to the hospital with 3 hours of nausea and vomiting.  He reports history of intermittent compliance with his insulin  and Ozempic .  He is no longer prescribed metformin , although it is still listed in his chart.  He reports that he took his insulin  yesterday.  Last night he had some alcohol  and pork skins, which he says is unusual for him.  He reported in the ED that some of his emesis had blood streaks.  Per ED note, no chest pain or shortness of breath, headaches, dysuria.  Blood sugar with EMS was 281.  In the emergency department he was found to have lactic acidosis and was started on broad-spectrum antibiotics-cefepime and vancomycin .  Due to increasing lactic acid level despite volume resuscitation of 2 L LR bolus PCCM was consulted.  Per nurses in the ICU antiemetics transiently helped but he has had several episodes of vomiting since being here.  Systolic blood pressure in the 200s.  Pertinent  Medical History  Diabetes on insulin  Hypertension History of stroke Peripheral artery disease Vitamin D  deficiency Tobacco abuse  Significant Hospital Events: Including procedures, antibiotic start and stop dates in addition to other pertinent events   5/4 admitted, 2 L bolus, cefepime vancomycin  started 5/5 PCCM taking over as primary. Adding PO antihypertensives, lasix   Interim History / Subjective:  Put out 4.6L urine 5/5   1 cleviprex   BMP grossly WNL -- K is borderline at 3.6. LA normal WBC down to 15 PCT 0.2 Less nauseated. Doing ok with clears    Objective   Blood pressure 137/66, pulse 88, temperature 98.1 F (36.7 C), resp. rate (!) 26,  height 5\' 10"  (1.778 m), weight 72.6 kg, SpO2 91%.        Intake/Output Summary (Last 24 hours) at 08/31/2023 0808 Last data filed at 08/31/2023 4098 Gross per 24 hour  Intake 2959.99 ml  Output 4315 ml  Net -1355.01 ml   Filed Weights   08/29/23 1191  Weight: 72.6 kg    Examination: General: chronically ill appearing M  HENT: Chronic R eye defect. Pink mmm   Lungs: CTAb on RA  Cardiovascular: rrr s1s2 bounding pulses brisk cap refill  Abdomen:  soft  Extremities:no obvious acute joint deformity  Neuro: AAOx4 following commands  Derm:  c/d/w no obvious rash   Resolved Hospital Problem list    Lactic acidosis   Assessment & Plan:   Poorly controlled HTN  P -add BID coreg 6.25. Cont norvasc , incr lisinopril  to 40 -wean off and dc clevi gtt -PRN hydral and PRN labetalol  for SBP > 160  -lasix 40mg  IV at noon -- getting some K runs this morning first  -txf to SDU when off clevi   Possible GI infection N/V - improving  Possible gastroparesis r/t ozempic , less favored  -not on metformin , needs to be taken off home meds list -exam and imaging not c/w mesenteric ischemia  P -cont clears. Talked about advancing further 5/6, he wants to stick w clears  -PRN antiemetics though he has been refusing  -cont flagyl , cefepime for now    Borderline hypoK -replacing since we are diuresing 5/6 -AM BMP   DM w hyperglycemia  P -  cont semglee  12 u and SSI  -as his intake incr suspect his coverage needs will. Ok for now  -DM coord to see  Intermittent claudication -pletal    Hx R MCA CVA  -statin -ASA   Chronic leukocytosis -has seen onc in the past -- baseline WBC 11-15 -CML r/o   Subjective difficulty swallowing -SLP consult   Cocaine use -TOC consult -needs to stop. Will only worsen BP issues   Best Practice (right click and "Reselect all SmartList Selections" daily)   Diet/type: clear liquids DVT prophylaxis SCD lovenox   Pressure ulcer(s): N/A GI prophylaxis:  N/A Lines: N/A Foley:  N/A Code Status:  full code Last date of multidisciplinary goals of care discussion [ ]   Labs   CBC: Recent Labs  Lab 08/29/23 0542 08/29/23 1448 08/30/23 0318 08/31/23 0325  WBC 21.1* 23.4* 19.7* 14.8*  NEUTROABS 18.0* 20.4*  --   --   HGB 12.7* 12.3* 12.3* 12.5*  HCT 41.7 40.4 39.8 40.7  MCV 63.7* 63.5* 64.7* 63.3*  PLT 221 235 220 196    Basic Metabolic Panel: Recent Labs  Lab 08/29/23 0542 08/29/23 1448 08/30/23 0318 08/31/23 0325  NA 138 136 134* 141  K 4.3 4.1 3.5 3.6  CL 103 99 100 103  CO2 25 25 24 29   GLUCOSE 279* 276* 181* 117*  BUN 15 12 10 7   CREATININE 0.83 0.86 0.78 0.59*  CALCIUM  9.2 8.9 8.3* 8.9  MG  --   --   --  2.0  PHOS  --   --   --  3.5   GFR: Estimated Creatinine Clearance: 117.2 mL/min (A) (by C-G formula based on SCr of 0.59 mg/dL (L)). Recent Labs  Lab 08/29/23 0542 08/29/23 0742 08/29/23 1448 08/29/23 1725 08/29/23 2144 08/30/23 0318 08/31/23 0325  PROCALCITON  --   --   --   --   --   --  0.21  WBC 21.1*  --  23.4*  --   --  19.7* 14.8*  LATICACIDVEN 4.0*   < > 6.5* 6.5* 4.6* 3.0* 1.3   < > = values in this interval not displayed.    Liver Function Tests: Recent Labs  Lab 08/29/23 0542 08/29/23 1448 08/30/23 0318 08/31/23 0325  AST 26 30 22 24   ALT 25 23 18 18   ALKPHOS 66 63 58 56  BILITOT 0.7 0.9 0.7 0.8  PROT 7.5 7.2 6.2* 6.8  ALBUMIN 4.3 4.0 3.6 3.7   Recent Labs  Lab 08/29/23 0542  LIPASE 26   No results for input(s): "AMMONIA" in the last 168 hours.  ABG    Component Value Date/Time   PHART 7.42 08/29/2023 1617   PCO2ART 37 08/29/2023 1617   PO2ART 70 (L) 08/29/2023 1617   HCO3 24.0 08/29/2023 1617   TCO2 28 04/29/2018 1612   ACIDBASEDEF 0.2 08/29/2023 1617   O2SAT 97 08/29/2023 1617     Coagulation Profile: No results for input(s): "INR", "PROTIME" in the last 168 hours.  Cardiac Enzymes: No results for input(s): "CKTOTAL", "CKMB", "CKMBINDEX", "TROPONINI" in the last  168 hours.  HbA1C: Hemoglobin A1C  Date/Time Value Ref Range Status  01/07/2023 04:02 PM 7.4 (A) 4.0 - 5.6 % Final  10/07/2022 03:15 PM 7.5 (A) 4.0 - 5.6 % Final   HbA1c, POC (prediabetic range)  Date/Time Value Ref Range Status  01/30/2022 01:51 PM 8.7 (A) 5.7 - 6.4 % Final  10/30/2021 02:09 PM 9.9 (A) 5.7 - 6.4 % Final   HbA1c, POC (controlled diabetic  range)  Date/Time Value Ref Range Status  01/30/2022 01:51 PM 8.7 (A) 0.0 - 7.0 % Final  10/30/2021 02:09 PM 9.9 (A) 0.0 - 7.0 % Final   HbA1c POC (<> result, manual entry)  Date/Time Value Ref Range Status  01/30/2022 01:51 PM 8.7 4.0 - 5.6 % Final  10/30/2021 02:09 PM 9.9 4.0 - 5.6 % Final   Hgb A1c MFr Bld  Date/Time Value Ref Range Status  08/29/2023 02:48 PM 9.0 (H) 4.8 - 5.6 % Final    Comment:    (NOTE) Pre diabetes:          5.7%-6.4%  Diabetes:              >6.4%  Glycemic control for   <7.0% adults with diabetes   06/22/2022 02:40 PM 10.0 (H) 4.8 - 5.6 % Final    Comment:             Prediabetes: 5.7 - 6.4          Diabetes: >6.4          Glycemic control for adults with diabetes: <7.0     CBG: Recent Labs  Lab 08/30/23 1519 08/30/23 1925 08/30/23 2341 08/31/23 0357 08/31/23 0745  GLUCAP 150* 141* 163* 112* 173*    CRITICAL CARE Performed by: Delories Fetter   Total critical care time: 36 min   Critical care time was exclusive of separately billable procedures and treating other patients. Critical care was necessary to treat or prevent imminent or life-threatening deterioration.  Critical care was time spent personally by me on the following activities: development of treatment plan with patient and/or surrogate as well as nursing, discussions with consultants, evaluation of patient's response to treatment, examination of patient, obtaining history from patient or surrogate, ordering and performing treatments and interventions, ordering and review of laboratory studies, ordering and review of  radiographic studies, pulse oximetry and re-evaluation of patient's condition.  Eston Hence MSN, AGACNP-BC  Pulmonary/Critical Care Medicine Amion for pager  08/31/2023, 8:08 AM

## 2023-08-31 NOTE — Plan of Care (Signed)
  Problem: Clinical Measurements: Goal: Cardiovascular complication will be avoided Outcome: Progressing   Problem: Activity: Goal: Risk for activity intolerance will decrease Outcome: Progressing   Problem: Coping: Goal: Level of anxiety will decrease Outcome: Progressing   Problem: Elimination: Goal: Will not experience complications related to urinary retention Outcome: Progressing   Problem: Pain Managment: Goal: General experience of comfort will improve and/or be controlled Outcome: Progressing

## 2023-08-31 NOTE — Telephone Encounter (Unsigned)
 Copied from CRM [id number]. Topic: General - Other >> Aug 31, 2023 12:22 PM Sophia H wrote: Reason for CRM: pt mother wanted to check in on social security paperwork that was supposed to be faxed out. Stated dropped off about a week ago, BRENDA WATTS (MOTHER) [phone]

## 2023-08-31 NOTE — Evaluation (Signed)
 Clinical/Bedside Swallow Evaluation Patient Details  Name: Frank Schneider MRN: [id number] Date of Birth: 04/18/1976  Today's Date: 08/31/2023 Time: SLP Start Time (ACUTE ONLY): 1700 SLP Stop Time (ACUTE ONLY): 1715 SLP Time Calculation (min) (ACUTE ONLY): 15 min  Past Medical History:  Past Medical History:  Diagnosis Date   Anticoagulated    ASA 325 mg--- ;managed by pcp  (stroke prevention)   Beta thalassemia trait    hematology/ oncologist--- dr Maryalice Smaller (Holli Lunger in epic 11-11-2022  elevated A2   Chronic foot pain    Diabetic neuropathy (HCC)    DOE (dyspnea on exertion)    02-23-2023  per pt sob w/ stairs , yard work, and ok with household chores,  does not do any time of walking or exercise   History of cerebrovascular accident (CVA) with residual deficit 12/31/2014   admission in epic;   right thalamic infarct second SVD residual LUE / LLE numb/ ting with sensory deficit   History of eye trauma    02-23-2023  per pt eye injury age late 29s , never seek medical attention,  vision became progressive worse over time;  then pt had stroke 01/ 2020 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,  s/p eye removal 02/ 2024   History of ischemic stroke 05/09/2015   admission in epic;   acute ischemia right ventral pons,  pt was taking his plavix    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 Fulton Job;  taking pletal  twice  daily   Leucocytosis    Numbness and tingling of left arm and leg    stroke residual,  sensory deficit resolve   Type 2 diabetes mellitus (HCC)    followed by pcp;   (02-23-2023  per pt has Libre 3,  blood sugar check multiple times daily,  stated average fasting sugar 120s,  stopped doing lantus  daily approx 09/ 2024,  only taking jardiance  daily and weely ozempic )   Vision abnormalities    Vitamin D  deficiency 12/2019   Wears glasses    Past Surgical History:  Past Surgical History:  Procedure Laterality Date   CATARACT EXTRACTION W/ INTRAOCULAR LENS IMPLANT Right 05/27/2016   EVISCERATION Right 06/11/2022   Procedure: EVISCERATION REPAIR WITH POSSIBLE INCLUSION;  Surgeon: Debbra Fairy, MD;  [facility];  Service: Ophthalmology;  Laterality: Right;   INCISION AND DRAINAGE PERIRECTAL ABSCESS  07/28/2011   Procedure: IRRIGATION AND DEBRIDEMENT PERIRECTAL ABSCESS;  Surgeon: Levert Ready, MD;  Location: [facility] ORS;  Service: General;  Laterality: N/A;   of skin muscle and subcutaneous tissue of perimeum  8cmx12cm area    IR GENERIC HISTORICAL  01/08/2016   IR RADIOLOGIST EVAL & MGMT 01/08/2016 [facility] INTERV RAD   LAPAROSCOPIC APPENDECTOMY N/A 12/16/2015   Procedure: APPENDECTOMY LAPAROSCOPIC;  Surgeon: Oza Blumenthal, MD;  Location: [facility] ORS;  Service: General;  Laterality: N/A;   PRIAPISM REPAIR     age 60s   (  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 male with PMH: CVA (2020), eye trauma with right eye removal, HTN, DM-. He presented to the ED on 08/29/23 with hyperglycemia, diffuse abdominal pain, nausea and multiple episodes of emesis. CT ab/pelvis with contrast did not show acute abdominal or pelvic pathology and portable CXR was negative. SLP swallow evaluation ordered on 08/31/23 after RN observed patient to have coughing episode when drinking liquids.    Assessment / Plan / Recommendation  Clinical Impression   Patient presents with clinical s/s of dysphagia as per this bedside swallow evaluation. He exhibited delayed, explosive coughing episodes following sips of thin liquids via cup or straw. He indicated that this is new and not from CVA in 2020, however initial swallow evaluation completed at bedside on 04/30/2018 reports very similar findings including:wet sounding voice, explosive coughing. SLP is recommending MBS to objectively assess his swallow function and determine aspiration risk. During discussion with patient, he wishes to remain on clear liquids diet at this time because he was just recently started on it and he is very hungry. SLP in agreement with this for now, but recommend ceasing PO diet if increase in frequency or intensity of coughing. SLP Visit Diagnosis: Dysphagia, unspecified (R13.10)    Aspiration Risk  Mild aspiration risk    Diet Recommendation Thin liquid    Liquid Administration via: Cup;Straw Medication Administration: Other (Comment) (as tolerated) Supervision: Patient able to self feed Compensations: Small sips/bites;Slow rate    Other  Recommendations Oral Care Recommendations: Oral care BID    Recommendations for follow up therapy are one component of a multi-disciplinary discharge planning process, led by the attending physician.  Recommendations may be updated based on patient status, additional functional criteria and insurance authorization.  Follow up Recommendations Other (comment) (TBD following MBS results)      Assistance Recommended at Discharge    Functional Status Assessment Patient has had a recent decline in their functional status and demonstrates the ability to make significant improvements in function in a reasonable and predictable amount of time.  Frequency and Duration min 2x/week  1 week       Prognosis Prognosis for improved oropharyngeal function: Good Barriers to Reach Goals: Time post onset      Swallow Study   General Date of  Onset: 08/31/23 HPI: Patient is a 48 y.o. male with PMH: CVA (2020), eye trauma with right eye removal, HTN, DM-. He presented to the ED on 08/29/23 with hyperglycemia, diffuse abdominal pain, nausea and multiple episodes of emesis. CT ab/pelvis with contrast did not show acute abdominal or pelvic pathology and portable CXR was negative. SLP swallow evaluation ordered on 08/31/23 after RN observed patient to have coughing episode when drinking liquids. Type of Study: Bedside Swallow Evaluation Previous Swallow Assessment: remote, 2020 following CVA Diet Prior to this Study: Clear liquid diet;Thin liquids (Level 0) Temperature Spikes Noted: No Respiratory Status: Room air History of Recent Intubation: No Behavior/Cognition: Alert;Cooperative;Pleasant mood Oral Cavity Assessment: Within Functional Limits Oral Care Completed by SLP: No Oral Cavity - Dentition: Adequate natural dentition Vision: Functional for self-feeding Self-Feeding Abilities: Able to feed self Patient Positioning: Upright in bed Baseline Vocal Quality: Normal Volitional Cough: Congested;Strong Volitional Swallow: Able to elicit    Oral/Motor/Sensory Function Overall Oral Motor/Sensory Function: Within functional limits   Ice Chips     Thin Liquid Thin Liquid: Impaired Presentation: Straw;Self Fed;Cup Pharyngeal  Phase Impairments: Cough -  Delayed Other Comments: delayed explosive coughing episodes over 50% of the time following sips of liquids    Nectar Thick     Honey Thick     Puree     Solid           Jacqualine Mater, MA, CCC-SLP Speech Therapy

## 2023-09-01 ENCOUNTER — Inpatient Hospital Stay (HOSPITAL_COMMUNITY)

## 2023-09-01 ENCOUNTER — Other Ambulatory Visit: Payer: Self-pay

## 2023-09-01 DIAGNOSIS — R1010 Upper abdominal pain, unspecified: Secondary | ICD-10-CM

## 2023-09-01 DIAGNOSIS — R112 Nausea with vomiting, unspecified: Secondary | ICD-10-CM | POA: Diagnosis not present

## 2023-09-01 DIAGNOSIS — E872 Acidosis, unspecified: Secondary | ICD-10-CM | POA: Diagnosis not present

## 2023-09-01 DIAGNOSIS — I70211 Atherosclerosis of native arteries of extremities with intermittent claudication, right leg: Secondary | ICD-10-CM | POA: Diagnosis not present

## 2023-09-01 LAB — BASIC METABOLIC PANEL WITH GFR
Anion gap: 10 (ref 5–15)
BUN: 8 mg/dL (ref 6–20)
CO2: 26 mmol/L (ref 22–32)
Calcium: 8.3 mg/dL — ABNORMAL LOW (ref 8.9–10.3)
Chloride: 99 mmol/L (ref 98–111)
Creatinine, Ser: 0.86 mg/dL (ref 0.61–1.24)
GFR, Estimated: >60 mL/min (ref >60)
Glucose, Bld: 100 mg/dL — ABNORMAL HIGH (ref 70–99)
Potassium: 3.2 mmol/L — ABNORMAL LOW (ref 3.5–5.1)
Sodium: 135 mmol/L (ref 135–145)

## 2023-09-01 LAB — CBC
HCT: 36.8 % — ABNORMAL LOW (ref 39.0–52.0)
Hemoglobin: 11.4 g/dL — ABNORMAL LOW (ref 13.0–17.0)
MCH: 19.4 pg — ABNORMAL LOW (ref 26.0–34.0)
MCHC: 31 g/dL (ref 30.0–36.0)
MCV: 62.7 fL — ABNORMAL LOW (ref 80.0–100.0)
Platelets: 179 10*3/uL (ref 150–400)
RBC: 5.87 MIL/uL — ABNORMAL HIGH (ref 4.22–5.81)
RDW: 17.9 % — ABNORMAL HIGH (ref 11.5–15.5)
WBC: 11.5 10*3/uL — ABNORMAL HIGH (ref 4.0–10.5)
nRBC: 0 % (ref 0.0–0.2)

## 2023-09-01 LAB — GLUCOSE, CAPILLARY
Glucose-Capillary: 113 mg/dL — ABNORMAL HIGH (ref 70–99)
Glucose-Capillary: 122 mg/dL — ABNORMAL HIGH (ref 70–99)
Glucose-Capillary: 227 mg/dL — ABNORMAL HIGH (ref 70–99)
Glucose-Capillary: 237 mg/dL — ABNORMAL HIGH (ref 70–99)
Glucose-Capillary: 163 mg/dL — ABNORMAL HIGH (ref 70–99)
Glucose-Capillary: 175 mg/dL — ABNORMAL HIGH (ref 70–99)

## 2023-09-01 MED ORDER — POTASSIUM CHLORIDE CRYS ER 20 MEQ PO TBCR: 40.0000 meq | EXTENDED_RELEASE_TABLET | ORAL | Status: DC

## 2023-09-01 MED ORDER — FOOD THICKENER (SIMPLYTHICK HONEY)
10.0000 | ORAL | Status: DC | PRN
Start: 2023-09-01 — End: 2023-09-03

## 2023-09-01 MED ORDER — POLYETHYLENE GLYCOL 3350 17 G PO PACK: 17.0000 g | PACK | Freq: Once | ORAL | Status: DC

## 2023-09-01 MED ORDER — SENNOSIDES-DOCUSATE SODIUM 8.6-50 MG PO TABS
1.0000 | ORAL_TABLET | Freq: Two times a day (BID) | ORAL | Status: DC
  Administered 2023-09-01 – 2023-09-03 (×2): 1 via ORAL
  Filled 2023-09-01 (×4): qty 1

## 2023-09-01 MED ORDER — INSULIN ASPART 100 UNIT/ML IJ SOLN: 0.0000 [IU] | Freq: Every day | INTRAMUSCULAR | Status: DC

## 2023-09-01 MED ORDER — CHLORHEXIDINE GLUCONATE CLOTH 2 % EX PADS
6.0000 | MEDICATED_PAD | Freq: Every day | CUTANEOUS | Status: DC
Start: 2023-09-02 — End: 2023-09-03
  Administered 2023-09-02: 6 via TOPICAL

## 2023-09-01 MED ORDER — POTASSIUM CHLORIDE 20 MEQ PO PACK
40.0000 meq | PACK | ORAL | Status: DC
  Administered 2023-09-01: 40 meq via ORAL
  Filled 2023-09-01 (×2): qty 2

## 2023-09-01 MED ORDER — HYDRALAZINE HCL 20 MG/ML IJ SOLN
10.0000 mg | INTRAMUSCULAR | Status: DC | PRN
  Administered 2023-09-01: 10 mg via INTRAVENOUS
  Filled 2023-09-01: qty 1

## 2023-09-01 MED ORDER — BOOST / RESOURCE BREEZE PO LIQD CUSTOM
1.0000 | Freq: Three times a day (TID) | ORAL | Status: DC
  Administered 2023-09-01 – 2023-09-03 (×5): 1 via ORAL

## 2023-09-01 MED ORDER — PNEUMOCOCCAL 20-VAL CONJ VACC 0.5 ML IM SUSY
0.5000 mL | PREFILLED_SYRINGE | INTRAMUSCULAR | Status: DC
Start: 2023-09-02 — End: 2023-09-03
  Filled 2023-09-01 (×2): qty 0.5

## 2023-09-01 MED ORDER — BENZONATATE 100 MG PO CAPS
200.0000 mg | ORAL_CAPSULE | Freq: Three times a day (TID) | ORAL | Status: DC
  Administered 2023-09-01 – 2023-09-03 (×6): 200 mg via ORAL
  Filled 2023-09-01 (×7): qty 2

## 2023-09-01 MED ORDER — POTASSIUM CHLORIDE CRYS ER 20 MEQ PO TBCR
40.0000 meq | EXTENDED_RELEASE_TABLET | Freq: Once | ORAL | Status: AC
  Administered 2023-09-01: 40 meq via ORAL
  Filled 2023-09-01: qty 2

## 2023-09-01 MED ORDER — INSULIN ASPART 100 UNIT/ML IJ SOLN
0.0000 [IU] | Freq: Three times a day (TID) | INTRAMUSCULAR | Status: DC
  Administered 2023-09-01 – 2023-09-02 (×3): 3 [IU] via SUBCUTANEOUS
  Administered 2023-09-02 – 2023-09-03 (×3): 2 [IU] via SUBCUTANEOUS

## 2023-09-01 MED ORDER — CARVEDILOL 12.5 MG PO TABS
12.5000 mg | ORAL_TABLET | Freq: Two times a day (BID) | ORAL | Status: DC
  Administered 2023-09-01 – 2023-09-03 (×5): 12.5 mg via ORAL
  Filled 2023-09-01 (×5): qty 1

## 2023-09-01 MED ORDER — GUAIFENESIN-DM 100-10 MG/5ML PO SYRP: 5.0000 mL | ORAL_SOLUTION | ORAL | Status: DC | PRN

## 2023-09-01 NOTE — Inpatient Diabetes Management (Signed)
 Inpatient Diabetes Program Recommendations  AACE/ADA: New Consensus Statement on Inpatient Glycemic Control (2015)  Target Ranges:  Prepandial:   less than 140 mg/dL      Peak postprandial:   less than 180 mg/dL (1-2 hours)      Critically ill patients:  140 - 180 mg/dL   Lab Results  Component Value Date   GLUCAP 227 (H) 09/01/2023   HGBA1C 8.9 (H) 08/31/2023    Review of Glycemic Control  Diabetes history: DM2 Outpatient Diabetes medications: Lantus  40 at bedtime, Jardiance  25 daily, Ozempic  0.5 weekly Current orders for Inpatient glycemic control: Semglee  12 daily, Novolog  0-9 TID with meals and 0-5 HS  HgbA1C - 8.9% CBGs: 113, 122, 227 today  Inpatient Diabetes Program Recommendations:    Consider adding Novolog  2-3 units TID with meals if eating > 50%  D/C Ozempic  0.5 weekly at discharge  Spoke with pt at bedside regarding his diabetes and HgbA1C of 8.9%. Pt states he previously had a "nurse" assigned to go to his house everyday for 2 hours. This has stopped, according to pt, and he doesn't know why. Says he needs assistance at home. Lives alone and needs help with meds, food, ADL. Has family who lives nearby, and states they are all busy with their lives. Mother has cancer and brother takes care of her. Pt states he was only taking his insulin  if his blood sugar was "real high." Uses CGM to monitor blood sugars. Pt states he doesn't feel like he can take care of himself and manage his insulin  at home by himself. Discussed importance of leaving off ETOH and cocaine. Pt nodded in agreement.   Consult to [facility] for assistance with obtaining home health or some type of assistance with taking meds, preparing food, etc.   Will continue to follow.  Thank you. Joni Net, RD, LDN, CDCES Inpatient Diabetes Coordinator [phone]

## 2023-09-01 NOTE — Procedures (Signed)
 Modified Barium Swallow Study  Patient Details  Name: Frank Schneider MRN: [id number] Date of Birth: 1976/01/21  Today's Date: 09/01/2023  Modified Barium Swallow completed.  Full report located under Chart Review in the Imaging Section.  History of Present Illness Patient is a 48 y.o. male with PMH: CVA (2020), eye trauma with right eye removal, HTN, DM-. He presented to the ED on 08/29/23 with hyperglycemia, diffuse abdominal pain, nausea and multiple episodes of emesis. CT ab/pelvis with contrast did not show acute abdominal or pelvic pathology and portable CXR was negative. SLP swallow evaluation ordered on 08/31/23 after RN observed patient to have coughing episode when drinking liquids.   Clinical Impression Patient presents with a moderately impaired oroopharyngeal dysphagia as per this MBS. 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 Cervical osteophyte resulted in only minimal epiglottic inversion and incomplete laryngeal vestibule closure. Swallow was initiated at level of pyriform sinus with thin, nectar thick and honey thick liquids and at level of vallecular sinus with puree solids. Silent aspiration (PAS 8) occured with thin and nectar thick liquids during the swallow as well as after from penetrate that had not cleared. Cued cough was effective to clear penetrates from laryngeal vestibule but ineffective to clear aspirate. Initially, honey thick liquids appeared to be well tolerated, however towards end of study, patient with silent aspiration of gross amount of honey thick barium which occured when oral residuals transited down into open airway before swallow was triggered. Puree/pudding thick was the only consistency that did not result in any incidents of penetration or aspiration. SLP recommending to change PO diet to full liquids/honey thick.   Factors that may increase risk of  adverse event in presence of aspiration Roderick Civatte & Jessy Morocco 2021): Reduced cognitive function;Frail or deconditioned;Frequent aspiration of large volumes;Poor general health and/or compromised immunity  Swallow Evaluation Recommendations Recommendations: PO diet PO Diet Recommendation: Full liquid diet;Extremely thick liquids (Level 4, pudding thick) Liquid Administration via: Spoon Medication Administration: Whole meds with puree Supervision: Patient able to self-feed;Intermittent supervision/cueing for swallowing strategies;Set-up assistance for safety Swallowing strategies  : Slow rate;Small bites/sips Postural changes: Position pt fully upright for meals Oral care recommendations: Oral care BID (2x/day) Caregiver Recommendations: Avoid jello, ice cream, thin soups, popsicles;Have oral suction available;Remove water pitcher      Jacqualine Mater, MA, CCC-SLP Speech Therapy

## 2023-09-01 NOTE — Progress Notes (Addendum)
 [facility]                                                                              Frank Schneider, is a 48 y.o. male, DOB - 1976-01-12, [id number] Admit date - 08/29/2023    Outpatient Primary MD for the patient is Jerrlyn Morel, NP  LOS - 3  days  Chief Complaint  Patient presents with   Hyperglycemia   Nausea   Emesis       Brief summary   Patient is a 47 year old male with tobacco use, PAD, diabetes eliquis, HTN, prior history of stroke presented to the hospital with 3 hours of nausea and vomiting.  He reported history of intermittent compliance with insulin  and Ozempic , no longer on metformin .  A night before the admission, he had some alcohol  and pork skins which he says is unusual for him.  Also reported in the ED that some of his emesis had blood streaks.  No chest pain, shortness of breath, headaches or dysuria.  Blood sugar was 281. In ED, found to have lactic acidosis, was started on broad-spectrum antibiotics, vancomycin  and cefepime.  Due to increasing lactic acid level despite volume resuscitation and several episodes of vomiting, uncontrolled hypertension with SBP in 200s, patient was admitted to ICU. 5/7: Transfered to stepdown unit and TRH assumed care  Assessment & Plan    Principal Problem: Possible GI infection/gastritis, gastroenteritis - Possible gastroparesis due to Ozempic , not on metformin  - Feels overall better today, hungry, SLP evaluation pending - For now, advance to full liquid diet - Cefepime discontinued, still currently on IV Flagyl  Addendum 2:30pm Reviewed SLP evaluation note.  Discussed with GI, Dr. Lavaughn Portland, recommended hold off Ozempic , stay on full liquid diet if patient can tolerate for a couple of weeks.  Plan on outpatient EGD if no significant improvement.    Uncontrolled hypertension -Off the Cleviprex drip - Continue lisinopril , amlodipine  10 mg daily - BP does not still somewhat elevated, increased Coreg to  12.5 mg twice daily - Continue hydralazine  as needed with parameters - 2D echo showed EF of 70 to 75%, no regional WMA, normal diastolic parameters  Lactic acidosis -Initial lactic acid 4.0, trended up to 6.5, received IV fluid hydration - Now resolved  Hypokalemia - Replace  Diabetes mellitus, uncontrolled with hyperglycemia - Hemoglobin A1c 8.9 - Diabetic coordinator consult - Currently on Semglee  12 units daily, sliding scale insulin , sensitive, changed to 3 times daily AC + HS CBG (last 3)  Recent Labs    08/31/23 2320 09/01/23 0351 09/01/23 0741  GLUCAP 178* 113* 122*     Intermittent claudication, PAD - Continue Pletal    History of right MCA CVA -Continue aspirin , statin   Chronic leukocytosis -Chronic leukocytosis, has seen oncology in the past, improving  Subjective difficulty swallowing -SLP pending  Cocaine use UDS positive for cocaine    Estimated body mass index is 22.96 kg/m as calculated from the following:   Height as of this encounter: 5\' 10"  (1.778 m).   Weight as of this encounter: 72.6 kg.  Code Status: Full code DVT Prophylaxis:  enoxaparin  (LOVENOX ) injection 40 mg Start: 08/29/23  2200 Place and maintain sequential compression device Start: 08/29/23 1452   Level of Care: Level of care: Stepdown Family Communication: Updated patient' Disposition Plan:      Remains inpatient appropriate: Transfer to telemetry floor   Procedures:  2D echo  Consultants:   PCCM  Antimicrobials:   Anti-infectives (From admission, onward)    Start     Dose/Rate Route Frequency Ordered Stop   08/29/23 2200  metroNIDAZOLE  (FLAGYL ) IVPB 500 mg        500 mg 100 mL/hr over 60 Minutes Intravenous Every 12 hours 08/29/23 1342 09/05/23 2159   08/29/23 2000  ceFEPIme (MAXIPIME) 2 g in sodium chloride  0.9 % 100 mL IVPB  Status:  Discontinued        2 g 200 mL/hr over 30 Minutes Intravenous Every 8 hours 08/29/23 1357 08/31/23 1314   08/29/23 1815   Vancomycin  (VANCOCIN ) 1,250 mg in sodium chloride  0.9 % 250 mL IVPB  Status:  Discontinued        1,250 mg 166.7 mL/hr over 90 Minutes Intravenous 2 times daily 08/29/23 1724 08/30/23 1020   08/29/23 1800  vancomycin  (VANCOREADY) IVPB 1250 mg/250 mL  Status:  Discontinued        1,250 mg 166.7 mL/hr over 90 Minutes Intravenous Every 12 hours 08/29/23 1357 08/29/23 1720   08/29/23 1800  vancomycin  (VANCOREADY) IVPB 1250 mg/250 mL  Status:  Discontinued        1,250 mg 166.7 mL/hr over 90 Minutes Intravenous 2 times daily 08/29/23 1720 08/29/23 1724   08/29/23 1045  ceFEPIme (MAXIPIME) 2 g in sodium chloride  0.9 % 100 mL IVPB        2 g 200 mL/hr over 30 Minutes Intravenous  Once 08/29/23 1031 08/29/23 1120   08/29/23 1045  metroNIDAZOLE  (FLAGYL ) IVPB 500 mg        500 mg 100 mL/hr over 60 Minutes Intravenous  Once 08/29/23 1031 08/29/23 1218   08/29/23 1045  vancomycin  (VANCOCIN ) IVPB 1000 mg/200 mL premix        1,000 mg 200 mL/hr over 60 Minutes Intravenous  Once 08/29/23 1031 08/29/23 1343          Medications  amLODipine   10 mg Oral Daily   aspirin   325 mg Oral Daily   atorvastatin   40 mg Oral Daily   benzonatate  200 mg Oral TID   carvedilol  12.5 mg Oral BID WC   [START ON 09/02/2023] Chlorhexidine  Gluconate Cloth  6 each Topical Q2200   cilostazol   100 mg Oral BID AC   enoxaparin  (LOVENOX ) injection  40 mg Subcutaneous Q24H   feeding supplement  1 Container Oral TID BM   insulin  aspart  0-5 Units Subcutaneous QHS   insulin  aspart  0-9 Units Subcutaneous TID WC   insulin  glargine-yfgn  12 Units Subcutaneous Daily   lisinopril   40 mg Oral Daily   pantoprazole   40 mg Oral Q1200   [START ON 09/02/2023] pneumococcal 20-valent conjugate vaccine  0.5 mL Intramuscular Tomorrow-1000   polyethylene glycol  17 g Oral Once   potassium chloride   40 mEq Oral Q4H   senna-docusate  1 tablet Oral BID      Subjective:   Frank Schneider was seen and examined today.  Feeling much  better today, BP somewhat still elevated in the morning.  Noted on the clears and would like to advance diet.  Patient denies dizziness, chest pain, shortness of breath, abdominal pain, N/V/D/C, new weakness, numbess, tingling. No acute events overnight.  Objective:   Vitals:   09/01/23 0800 09/01/23 0900 09/01/23 0907 09/01/23 1000  BP: (!) 171/65 (!) 182/73 (!) 182/73 128/63  Pulse: 71 77 78 72  Resp: 18 17 16 18   Temp:      TempSrc:      SpO2: 97% 99% 95% 100%  Weight:      Height:        Intake/Output Summary (Last 24 hours) at 09/01/2023 1034 Last data filed at 09/01/2023 0400 Gross per 24 hour  Intake 678.98 ml  Output 2425 ml  Net -1746.02 ml     Wt Readings from Last 3 Encounters:  08/29/23 72.6 kg  02/22/23 79.8 kg  01/07/23 80.8 kg     Exam General: Alert and oriented x 3, NAD Cardiovascular: S1 S2 auscultated,  RRR Respiratory: Clear to auscultation bilaterally, no wheezing Gastrointestinal: Soft, nontender, nondistended, + bowel sounds Ext: no pedal edema bilaterally Neuro: Strength 5/5 upper and lower extremities bilaterally Psych: Normal affect     Data Reviewed:  I have personally reviewed following labs    CBC Lab Results  Component Value Date   WBC 11.5 (H) 09/01/2023   RBC 5.87 (H) 09/01/2023   HGB 11.4 (L) 09/01/2023   HCT 36.8 (L) 09/01/2023   MCV 62.7 (L) 09/01/2023   MCH 19.4 (L) 09/01/2023   PLT 179 09/01/2023   MCHC 31.0 09/01/2023   RDW 17.9 (H) 09/01/2023   LYMPHSABS 1.7 08/29/2023   MONOABS 1.0 08/29/2023   EOSABS 0.0 08/29/2023   BASOSABS 0.1 08/29/2023     Last metabolic panel Lab Results  Component Value Date   NA 135 09/01/2023   K 3.2 (L) 09/01/2023   CL 99 09/01/2023   CO2 26 09/01/2023   BUN 8 09/01/2023   CREATININE 0.86 09/01/2023   GLUCOSE 100 (H) 09/01/2023   GFRNONAA >60 09/01/2023   GFRAA >60 01/21/2020   CALCIUM  8.3 (L) 09/01/2023   PHOS 3.5 08/31/2023   PROT 6.8 08/31/2023   ALBUMIN 3.7 08/31/2023    LABGLOB 2.3 10/07/2022   AGRATIO 1.9 10/07/2022   BILITOT 0.8 08/31/2023   ALKPHOS 56 08/31/2023   AST 24 08/31/2023   ALT 18 08/31/2023   ANIONGAP 10 09/01/2023    CBG (last 3)  Recent Labs    08/31/23 2320 09/01/23 0351 09/01/23 0741  GLUCAP 178* 113* 122*      Coagulation Profile: No results for input(s): "INR", "PROTIME" in the last 168 hours.   Radiology Studies: I have personally reviewed the imaging studies  No results found.     Bertram Brocks M.D. [facility] 09/01/2023, 10:34 AM  Available via Epic secure chat 7am-7pm After 7 pm, please refer to night coverage provider listed on amion.

## 2023-09-01 NOTE — Plan of Care (Signed)

## 2023-09-02 DIAGNOSIS — E872 Acidosis, unspecified: Secondary | ICD-10-CM | POA: Diagnosis not present

## 2023-09-02 DIAGNOSIS — H5410 Blindness, one eye, low vision other eye, unspecified eyes: Secondary | ICD-10-CM

## 2023-09-02 DIAGNOSIS — F32A Depression, unspecified: Secondary | ICD-10-CM

## 2023-09-02 DIAGNOSIS — R112 Nausea with vomiting, unspecified: Secondary | ICD-10-CM | POA: Diagnosis not present

## 2023-09-02 DIAGNOSIS — F419 Anxiety disorder, unspecified: Secondary | ICD-10-CM | POA: Diagnosis not present

## 2023-09-02 LAB — CBC
HCT: 33.8 % — ABNORMAL LOW (ref 39.0–52.0)
Hemoglobin: 10.3 g/dL — ABNORMAL LOW (ref 13.0–17.0)
MCH: 19.4 pg — ABNORMAL LOW (ref 26.0–34.0)
MCHC: 30.5 g/dL (ref 30.0–36.0)
MCV: 63.5 fL — ABNORMAL LOW (ref 80.0–100.0)
Platelets: 158 10*3/uL (ref 150–400)
RBC: 5.32 MIL/uL (ref 4.22–5.81)
RDW: 16.5 % — ABNORMAL HIGH (ref 11.5–15.5)
WBC: 8.6 10*3/uL (ref 4.0–10.5)
nRBC: 0 % (ref 0.0–0.2)

## 2023-09-02 LAB — RENAL FUNCTION PANEL
Albumin: 3.1 g/dL — ABNORMAL LOW (ref 3.5–5.0)
Anion gap: 6 (ref 5–15)
BUN: 10 mg/dL (ref 6–20)
CO2: 28 mmol/L (ref 22–32)
Calcium: 8.6 mg/dL — ABNORMAL LOW (ref 8.9–10.3)
Chloride: 103 mmol/L (ref 98–111)
Creatinine, Ser: 0.81 mg/dL (ref 0.61–1.24)
GFR, Estimated: >60 mL/min (ref >60)
Glucose, Bld: 176 mg/dL — ABNORMAL HIGH (ref 70–99)
Phosphorus: 3 mg/dL (ref 2.5–4.6)
Potassium: 3.9 mmol/L (ref 3.5–5.1)
Sodium: 137 mmol/L (ref 135–145)

## 2023-09-02 LAB — GLUCOSE, CAPILLARY
Glucose-Capillary: 166 mg/dL — ABNORMAL HIGH (ref 70–99)
Glucose-Capillary: 243 mg/dL — ABNORMAL HIGH (ref 70–99)
Glucose-Capillary: 187 mg/dL — ABNORMAL HIGH (ref 70–99)
Glucose-Capillary: 169 mg/dL — ABNORMAL HIGH (ref 70–99)

## 2023-09-02 MED ORDER — INSULIN ASPART 100 UNIT/ML IJ SOLN
3.0000 [IU] | Freq: Three times a day (TID) | INTRAMUSCULAR | Status: DC
  Administered 2023-09-02 – 2023-09-03 (×3): 3 [IU] via SUBCUTANEOUS

## 2023-09-02 NOTE — Plan of Care (Signed)

## 2023-09-02 NOTE — TOC Progression Note (Addendum)
 Transition of Care [facility]) - Progression Note    Patient Details  Name: Frank Schneider MRN: [id number] Date of Birth: 1975-06-16  [facility] CM/SW Contact  Jonni Nettle, LCSW Phone Number: 09/02/2023, 11:07 AM  Clinical Narrative:    ADDENDUM  Pt to receive HH PT and Aide services with [facility].  CSW met with pt at bedside to discuss discharge plans and needs. Pt reports he used to have a HH Aide who helped him with ADLs, cooking, and cleaning. Pt reports HH Aide was through his medicaid. Pt also reports his mother is his payee. CSW advised that hospital can try to set up [facility] Aide, but will likely only come out to the home 1-2x per week. CSW advised pt to reach out to his PCP and medicaid case worker to assist in finding [facility] Aide for additional hours. Pt reports alcohol  and drug use. Pt is open to resources being placed in AVS for substance use resources. CSW will reach out to [facility] agencies to inquire about Aide services. TOC will continue to follow.  Expected Discharge Plan and Services Home    Social Determinants of Health (SDOH) Interventions SDOH Screenings   Food Insecurity: Food Insecurity Present (08/29/2023)  Housing: Low Risk  (08/29/2023)  Transportation Needs: No Transportation Needs (08/29/2023)  Utilities: Not At Risk (08/29/2023)  Depression (PHQ2-9): Low Risk  (01/07/2023)  Tobacco Use: High Risk (08/29/2023)    Readmission Risk Interventions    08/30/2023   10:01 AM  Readmission Risk Prevention Plan  Transportation Screening Complete  PCP or Specialist Appt within 5-7 Days Complete  Home Care Screening Complete  Medication Review (RN CM) Complete    [place], MSW, LCSW 09/02/2023 11:12 AM

## 2023-09-02 NOTE — Progress Notes (Signed)
 Speech Language Pathology Treatment: Dysphagia  Patient Details Name: Frank Schneider MRN: [id number] DOB: Jun 25, 1975 Today's Date: 09/02/2023 Time: 1650-1730 SLP Time Calculation (min) (ACUTE ONLY): 40 min  Assessment / Plan / Recommendation Clinical Impression  Patient seen by SLP for skilled treatment focused on dysphagia goals. Patient was awake, alert, voice sounding clear and strong. He himself was significantly more interactive today as compared to previous date, and only exhibiting mild expressive language impairment as compared to yesterday's which was moderate in impairment level. He told SLP that he has been thinking about it and he does remember having some difficulty with swallowing when he first had the stroke that lasted for "a while" but he denied having and recent dysphagia symptoms. Since having the MBS previous date, he said he is more aware of his swallowing. SLP reviewed recent notes in chart and discussed with patient. Per Dr. Gerilyn Kobus note, she consulted with GI MD, Dr. Lavaughn Portland, who recommended to continue with full liquids diet for a couple of weeks if he can tolerate and plan for OP EGD if no improvement. Patient may discharge home as early as tomorrow. Patient lives alone and expecting him to be able to manage pudding thick liquids is not realistic and so SLP assessed patient's toleration of nectar thick liquids, thin liquids and pudding thick liquids at bedside, and asked patient to describe how the different consistencies felt to him. With thin liquids he said, "I can feel it" and when asked to clarify, he indicated that the thin liquids made him feel like he needed to cough. With pudding thick liquids, he felt that it would take a long time to drink. Nectar thick liquids felt best to him overall. SLP in agreement as although he is at a risk for aspiration with nectar thick liquids as seen on MBS, it is not so viscous as honey thick and so can be more easily expelled from airway. SLP  provided patient with a Simply Thick starter kit and instructed him on how to use it. SLP is recommending OP SLP  and an OP MBS in approximately a week. SLP also advanced patient to nectar thick liquids (continue with full liquids) while here at the hospital. He was encouraged to let RN know if he was having an increase in coughing with this liquid consistency advancement.    HPI HPI: Patient is a 48 y.o. male with PMH: CVA (2020), eye trauma with right eye removal, HTN, DM-. He presented to the ED on 08/29/23 with hyperglycemia, diffuse abdominal pain, nausea and multiple episodes of emesis. CT ab/pelvis with contrast did not show acute abdominal or pelvic pathology and portable CXR was negative. SLP swallow evaluation ordered on 08/31/23 after RN observed patient to have coughing episode when drinking liquids.      SLP Plan  Continue with current plan of care      Recommendations for follow up therapy are one component of a multi-disciplinary discharge planning process, led by the attending physician.  Recommendations may be updated based on patient status, additional functional criteria and insurance authorization.    Recommendations  Diet recommendations: Nectar-thick liquid;Other(comment) (full liquids) Liquids provided via: Cup Medication Administration: Whole meds with puree Supervision: Patient able to self feed Compensations: Small sips/bites;Slow rate Postural Changes and/or Swallow Maneuvers: Seated upright 90 degrees                  Oral care BID;Patient independent with oral care   Set up Supervision/Assistance Dysphagia, oropharyngeal phase (R13.12)  Continue with current plan of care     Frank Mater, MA, CCC-SLP Speech Therapy

## 2023-09-02 NOTE — Progress Notes (Addendum)
 [facility]                                                                              Frank Schneider, is a 48 y.o. male, DOB - May 18, 1975, GEX:[id number] Admit date - 08/29/2023    Outpatient Primary MD for the patient is Jerrlyn Morel, NP  LOS - 4  days  Chief Complaint  Patient presents with   Hyperglycemia   Nausea   Emesis       Brief summary   Patient is a 48 year old male with tobacco use, PAD, diabetes eliquis, HTN, prior history of stroke presented to the hospital with 3 hours of nausea and vomiting.  He reported history of intermittent compliance with insulin  and Ozempic , no longer on metformin .  A night before the admission, he had some alcohol  and pork skins which he says is unusual for him.  Also reported in the ED that some of his emesis had blood streaks.  No chest pain, shortness of breath, headaches or dysuria.  Blood sugar was 281. In ED, found to have lactic acidosis, was started on broad-spectrum antibiotics, vancomycin  and cefepime.  Due to increasing lactic acid level despite volume resuscitation and several episodes of vomiting, uncontrolled hypertension with SBP in 200s, patient was admitted to ICU. 5/7: Transfered to stepdown unit and [facility] assumed care  Assessment & Plan    Principal Problem: SIRS. Possible GI infection/gastritis, gastroenteritis - Possible gastroparesis due to Ozempic , not on metformin  - Continue full liquid diet, thickened liquids  - Cefepime discontinued, still currently on IV Flagyl  -  Reviewed SLP evaluation note.  Discussed with GI, Dr. Lavaughn Portland, recommended hold off Ozempic , stay on full liquid diet if patient can tolerate for a couple of weeks.  Plan on outpatient EGD if no significant improvement.    Uncontrolled hypertension -Off the Cleviprex drip - Continue lisinopril , amlodipine , Coreg 12.5 mg twice daily continue lisinopril , amlodipine  10 mg daily - Continue hydralazine  as needed with parameters - 2D echo  showed EF of 70 to 75%, no regional WMA, normal diastolic parameters  Lactic acidosis -Initial lactic acid 4.0, trended up to 6.5, received IV fluid hydration - Now resolved  Hypokalemia - Replace as needed  Diabetes mellitus, uncontrolled with hyperglycemia - Hemoglobin A1c 8.9 - Diabetic coordinator consult CBG (last 3)  Recent Labs    09/01/23 1949 09/01/23 2153 09/02/23 0746  GLUCAP 163* 175* 166*    Continue Semglee  12 units daily, SSI sensitive, 3 times daily AC , HS -Added NovoLog  3 units 3 times daily AC   Intermittent claudication, PAD - Continue Pletal    History of right MCA CVA -Continue aspirin , statin   Chronic leukocytosis -Chronic leukocytosis, has seen oncology in the past, improving Resolved  Cocaine use UDS positive for cocaine    Estimated body mass index is 22.96 kg/m as calculated from the following:   Height as of this encounter: 5\' 10"  (1.778 m).   Weight as of this encounter: 72.6 kg.  Code Status: Full code DVT Prophylaxis:  enoxaparin  (LOVENOX ) injection 40 mg Start: 08/29/23 2200 Place and maintain sequential compression device Start: 08/29/23 1452   Level of  Care: Level of care: Telemetry Family Communication: Updated patient' Disposition Plan:      Remains inpatient appropriate: Hopefully will DC home in am    Procedures:  2D echo  Consultants:   PCCM  Antimicrobials:   Anti-infectives (From admission, onward)    Start     Dose/Rate Route Frequency Ordered Stop   08/29/23 2200  metroNIDAZOLE  (FLAGYL ) IVPB 500 mg        500 mg 100 mL/hr over 60 Minutes Intravenous Every 12 hours 08/29/23 1342 09/02/23 2359   08/29/23 2000  ceFEPIme (MAXIPIME) 2 g in sodium chloride  0.9 % 100 mL IVPB  Status:  Discontinued        2 g 200 mL/hr over 30 Minutes Intravenous Every 8 hours 08/29/23 1357 08/31/23 1314   08/29/23 1815  Vancomycin  (VANCOCIN ) 1,250 mg in sodium chloride  0.9 % 250 mL IVPB  Status:  Discontinued        1,250  mg 166.7 mL/hr over 90 Minutes Intravenous 2 times daily 08/29/23 1724 08/30/23 1020   08/29/23 1800  vancomycin  (VANCOREADY) IVPB 1250 mg/250 mL  Status:  Discontinued        1,250 mg 166.7 mL/hr over 90 Minutes Intravenous Every 12 hours 08/29/23 1357 08/29/23 1720   08/29/23 1800  vancomycin  (VANCOREADY) IVPB 1250 mg/250 mL  Status:  Discontinued        1,250 mg 166.7 mL/hr over 90 Minutes Intravenous 2 times daily 08/29/23 1720 08/29/23 1724   08/29/23 1045  ceFEPIme (MAXIPIME) 2 g in sodium chloride  0.9 % 100 mL IVPB        2 g 200 mL/hr over 30 Minutes Intravenous  Once 08/29/23 1031 08/29/23 1120   08/29/23 1045  metroNIDAZOLE  (FLAGYL ) IVPB 500 mg        500 mg 100 mL/hr over 60 Minutes Intravenous  Once 08/29/23 1031 08/29/23 1218   08/29/23 1045  vancomycin  (VANCOCIN ) IVPB 1000 mg/200 mL premix        1,000 mg 200 mL/hr over 60 Minutes Intravenous  Once 08/29/23 1031 08/29/23 1343          Medications  amLODipine   10 mg Oral Daily   aspirin   325 mg Oral Daily   atorvastatin   40 mg Oral Daily   benzonatate  200 mg Oral TID   carvedilol  12.5 mg Oral BID WC   Chlorhexidine  Gluconate Cloth  6 each Topical Q2200   cilostazol   100 mg Oral BID AC   enoxaparin  (LOVENOX ) injection  40 mg Subcutaneous Q24H   feeding supplement  1 Container Oral TID BM   insulin  aspart  0-5 Units Subcutaneous QHS   insulin  aspart  0-9 Units Subcutaneous TID WC   insulin  glargine-yfgn  12 Units Subcutaneous Daily   lisinopril   40 mg Oral Daily   pantoprazole   40 mg Oral Q1200   pneumococcal 20-valent conjugate vaccine  0.5 mL Intramuscular Tomorrow-1000   polyethylene glycol  17 g Oral Once   senna-docusate  1 tablet Oral BID      Subjective:   Frank Schneider was seen and examined today.  Feeling better, tolerating full liquid diet.  No acute chest pain, shortness of breath, fever chills or abdominal pain.  No nausea or vomiting.    Objective:   Vitals:   09/02/23 0600 09/02/23  0700 09/02/23 0800 09/02/23 0900  BP: 125/60 (!) 157/65 (!) 162/74 (!) 152/57  Pulse: 65 64 69 70  Resp: 14 13 15 14   Temp:  98.4 F (36.9 C)  TempSrc:  Oral    SpO2: 97% 99% 99% 100%  Weight:      Height:        Intake/Output Summary (Last 24 hours) at 09/02/2023 1009 Last data filed at 09/02/2023 0900 Gross per 24 hour  Intake 372.64 ml  Output 850 ml  Net -477.36 ml     Wt Readings from Last 3 Encounters:  08/29/23 72.6 kg  02/22/23 79.8 kg  01/07/23 80.8 kg    Physical Exam General: Alert and oriented x 3, NAD Cardiovascular: S1 S2 clear, RRR.  Respiratory: CTAB Gastrointestinal: Soft, nontender, nondistended, NBS Ext: no pedal edema bilaterally Neuro: no new deficits Psych: Normal affect     Data Reviewed:  I have personally reviewed following labs    CBC Lab Results  Component Value Date   WBC 8.6 09/02/2023   RBC 5.32 09/02/2023   HGB 10.3 (L) 09/02/2023   HCT 33.8 (L) 09/02/2023   MCV 63.5 (L) 09/02/2023   MCH 19.4 (L) 09/02/2023   PLT 158 09/02/2023   MCHC 30.5 09/02/2023   RDW 16.5 (H) 09/02/2023   LYMPHSABS 1.7 08/29/2023   MONOABS 1.0 08/29/2023   EOSABS 0.0 08/29/2023   BASOSABS 0.1 08/29/2023     Last metabolic panel Lab Results  Component Value Date   NA 137 09/02/2023   K 3.9 09/02/2023   CL 103 09/02/2023   CO2 28 09/02/2023   BUN 10 09/02/2023   CREATININE 0.81 09/02/2023   GLUCOSE 176 (H) 09/02/2023   GFRNONAA >60 09/02/2023   GFRAA >60 01/21/2020   CALCIUM  8.6 (L) 09/02/2023   PHOS 3.0 09/02/2023   PROT 6.8 08/31/2023   ALBUMIN 3.1 (L) 09/02/2023   LABGLOB 2.3 10/07/2022   AGRATIO 1.9 10/07/2022   BILITOT 0.8 08/31/2023   ALKPHOS 56 08/31/2023   AST 24 08/31/2023   ALT 18 08/31/2023   ANIONGAP 6 09/02/2023    CBG (last 3)  Recent Labs    09/01/23 1949 09/01/23 2153 09/02/23 0746  GLUCAP 163* 175* 166*      Coagulation Profile: No results for input(s): "INR", "PROTIME" in the last 168  hours.   Radiology Studies: I have personally reviewed the imaging studies  DG Swallowing Func-Speech Pathology Result Date: 09/01/2023 Table formatting from the original result was not included. Images from the original result were not included. Modified Barium Swallow Study Patient Details Name: Frank Schneider MRN: [id number] Date of Birth: 1975-05-03 Today's Date: 09/01/2023 HPI/PMH: HPI: Patient is a 48 y.o. male with PMH: CVA (2020), eye trauma with right eye removal, HTN, DM-. He presented to the ED on 08/29/23 with hyperglycemia, diffuse abdominal pain, nausea and multiple episodes of emesis. CT ab/pelvis with contrast did not show acute abdominal or pelvic pathology and portable CXR was negative. SLP swallow evaluation ordered on 08/31/23 after RN observed patient to have coughing episode when drinking liquids. Clinical Impression: Clinical Impression: Patient presents with a moderately impaired oroopharyngeal dysphagia as per this MBS. 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 Cervical osteophyte resulted in only minimal epiglottic inversion and incomplete laryngeal vestibule closure. Swallow was initiated at level of pyriform sinus with thin, nectar thick and honey thick liquids and at level of vallecular sinus with puree solids. Silent aspiration (PAS 8) occured with thin and nectar thick liquids during the swallow as well as after from penetrate that had not cleared. Cued cough was effective to clear penetrates from laryngeal  vestibule but ineffective to clear aspirate. Initially, honey thick liquids appeared to be well tolerated, however towards end of study, patient with silent aspiration of gross amount of honey thick barium which occured when oral residuals transited down into open airway before swallow was triggered. Puree/pudding thick was the only consistency that did not result in any  incidents of penetration or aspiration. SLP recommending to change PO diet to full liquids/honey thick. Factors that may increase risk of adverse event in presence of aspiration Roderick Civatte & Jessy Morocco 2021): Factors that may increase risk of adverse event in presence of aspiration Roderick Civatte & Jessy Morocco 2021): Reduced cognitive function; Frail or deconditioned; Frequent aspiration of large volumes; Poor general health and/or compromised immunity Recommendations/Plan: Swallowing Evaluation Recommendations Swallowing Evaluation Recommendations Recommendations: PO diet PO Diet Recommendation: Full liquid diet; Extremely thick liquids (Level 4, pudding thick) Liquid Administration via: Spoon Medication Administration: Whole meds with puree Supervision: Patient able to self-feed; Intermittent supervision/cueing for swallowing strategies; Set-up assistance for safety Swallowing strategies  : Slow rate; Small bites/sips Postural changes: Position pt fully upright for meals Oral care recommendations: Oral care BID (2x/day) Caregiver Recommendations: Avoid jello, ice cream, thin soups, popsicles; Have oral suction available; Remove water pitcher Treatment Plan Treatment Plan Treatment recommendations: Therapy as outlined in treatment plan below Follow-up recommendations: Other (comment) (SLP at next venue of care) Functional status assessment: Patient has had a recent decline in their functional status and demonstrates the ability to make significant improvements in function in a reasonable and predictable amount of time. Treatment frequency: Min 2x/week Treatment duration: 2 weeks Interventions: Trials of upgraded texture/liquids; Diet toleration management by SLP; Patient/family education Recommendations Recommendations for follow up therapy are one component of a multi-disciplinary discharge planning process, led by the attending physician.  Recommendations may be updated based on patient status, additional functional criteria and  insurance authorization. 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) Boluses Administered: Boluses Administered Boluses Administered: Thin liquids (Level 0); Mildly thick liquids (Level 2, nectar thick); Moderately thick liquids (Level 3, honey thick); Solid; Puree  Oral Impairment Domain: Oral Impairment Domain Lip Closure: No labial escape Tongue control during bolus hold: Cohesive bolus between tongue to palatal seal Bolus preparation/mastication: Timely and efficient chewing and mashing Bolus transport/lingual motion: Brisk tongue motion Oral residue: Residue collection on oral structures Location of oral residue : Tongue Initiation of pharyngeal swallow : Pyriform sinuses; Valleculae  Pharyngeal Impairment Domain: Pharyngeal Impairment Domain Soft palate elevation: No bolus between soft palate (SP)/pharyngeal wall (PW) Laryngeal elevation: Minimal superior movement of thyroid  cartilage with minimal approximation of arytenoids to epiglottic petiole Anterior hyoid excursion: Complete anterior movement Epiglottic movement: Partial inversion Laryngeal vestibule closure: Incomplete, narrow column air/contrast in laryngeal vestibule Pharyngeal stripping wave : Present - diminished Pharyngeal contraction (A/P view only): N/A Pharyngoesophageal segment opening: Complete distension and complete duration, no obstruction of flow Tongue base retraction: Trace column of contrast or air between tongue base and PPW Pharyngeal residue: Collection of residue within or on pharyngeal structures Location of pharyngeal residue: Pharyngeal wall; Tongue base  Esophageal Impairment Domain: Esophageal Impairment Domain Esophageal clearance upright position: Complete clearance, esophageal coating Pill: No data  recorded Penetration/Aspiration Scale Score: Penetration/Aspiration Scale Score 1.  Material does not enter airway: Puree; Solid 5.  Material enters airway, CONTACTS cords and not ejected out: Thin liquids (Level 0); Mildly thick  liquids (Level 2, nectar thick) 8.  Material enters airway, passes BELOW cords without attempt by patient to eject out (silent aspiration) : Moderately thick liquids (Level 3, honey thick); Mildly thick liquids (Level 2, nectar thick); Thin liquids (Level 0) Compensatory Strategies: Compensatory Strategies Compensatory strategies: Yes Chin tuck: Ineffective Ineffective Chin Tuck: Thin liquid (Level 0)   General Information: No data recorded Diet Prior to this Study: Thin liquids (Level 0); Full liquid diet   No data recorded  No data recorded  Supplemental O2: None (Room air)   History of Recent Intubation: No  Behavior/Cognition: Alert; Cooperative; Pleasant mood Self-Feeding Abilities: Able to self-feed Baseline vocal quality/speech: Hypophonia/low volume Volitional Cough: Able to elicit Volitional Swallow: Able to elicit Exam Limitations: No limitations Goal Planning: Prognosis for improved oropharyngeal function: Fair Barriers to Reach Goals: Time post onset; Severity of deficits; Cognitive deficits No data recorded Patient/Family Stated Goal: patient reports he is hungry Consulted and agree with results and recommendations: Pt unable/family or caregiver not available; Nurse Pain: Pain Assessment Pain Assessment: No/denies pain End of Session: Start Time:SLP Start Time (ACUTE ONLY): 1700 Stop Time: SLP Stop Time (ACUTE ONLY): 1715 Time Calculation:SLP Time Calculation (min) (ACUTE ONLY): 15 min Charges: SLP Evaluations $ SLP Speech Visit: 1 Visit SLP Evaluations $BSS Swallow: 1 Procedure SLP visit diagnosis: SLP Visit Diagnosis: Dysphagia, oropharyngeal phase (R13.12) Past Medical History: Past Medical History: Diagnosis Date  Anticoagulated   ASA 325 mg--- ;managed by pcp  (stroke  prevention)  Beta thalassemia trait   hematology/ oncologist--- dr Maryalice Smaller (lov in epic 11-11-2022  elevated A2  Chronic foot pain   Diabetic neuropathy (HCC)   DOE (dyspnea on exertion)   02-23-2023  per pt sob w/ stairs , yard work, and ok with household chores,  does not do any time of walking or exercise  History of cerebrovascular accident (CVA) with residual deficit 12/31/2014  admission in epic;   right thalamic infarct second SVD residual LUE / LLE numb/ ting with sensory deficit  History of eye trauma   02-23-2023  per pt eye injury age late 55s , never seek medical attention,  vision became progressive worse over time;  then pt had stroke 01/ 2020 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,  s/p eye removal 02/ 2024  History of ischemic stroke 05/09/2015  admission in epic;   acute ischemia right ventral pons,  pt was taking his plavix   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 Fulton Job;  taking pletal  twice daily  Leucocytosis   Numbness and tingling of left arm and leg   stroke residual,  sensory deficit resolve  Type 2 diabetes mellitus (HCC)   followed by pcp;   (02-23-2023  per pt has Libre 3,  blood sugar check multiple times daily,  stated average fasting sugar 120s,  stopped doing lantus  daily approx 09/ 2024,  only taking jardiance  daily and weely ozempic )  Vision abnormalities  Vitamin D  deficiency 12/2019  Wears glasses  Past  Surgical History: Past Surgical History: Procedure Laterality Date  CATARACT EXTRACTION W/ INTRAOCULAR LENS IMPLANT Right 05/27/2016  EVISCERATION Right 06/11/2022  Procedure: EVISCERATION REPAIR WITH POSSIBLE INCLUSION;  Surgeon: Debbra Fairy, MD;  Location: [facility] OR;  Service: Ophthalmology;  Laterality: Right;  INCISION AND DRAINAGE PERIRECTAL ABSCESS  07/28/2011  Procedure: IRRIGATION AND DEBRIDEMENT PERIRECTAL ABSCESS;  Surgeon: Levert Ready, MD;  Location: [facility] ORS;  Service: General;  Laterality: N/A;   of skin muscle and subcutaneous tissue of perimeum  8cmx12cm area   IR GENERIC HISTORICAL  01/08/2016  IR RADIOLOGIST EVAL & MGMT 01/08/2016 [facility] INTERV RAD  LAPAROSCOPIC APPENDECTOMY N/A 12/16/2015  Procedure: APPENDECTOMY LAPAROSCOPIC;  Surgeon: Oza Blumenthal, MD;  Location: [facility] ORS;  Service: General;  Laterality: N/A;  PRIAPISM REPAIR    age 31s   (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 [facility] 09/02/2023, 10:09 AM  Available via Epic secure chat 7am-7pm After 7 pm, please refer to night coverage provider listed on amion.

## 2023-09-03 ENCOUNTER — Other Ambulatory Visit: Payer: Self-pay

## 2023-09-03 ENCOUNTER — Other Ambulatory Visit (HOSPITAL_COMMUNITY): Payer: Self-pay

## 2023-09-03 DIAGNOSIS — E872 Acidosis, unspecified: Secondary | ICD-10-CM | POA: Diagnosis not present

## 2023-09-03 DIAGNOSIS — I10 Essential (primary) hypertension: Secondary | ICD-10-CM | POA: Diagnosis not present

## 2023-09-03 DIAGNOSIS — E1165 Type 2 diabetes mellitus with hyperglycemia: Secondary | ICD-10-CM | POA: Diagnosis not present

## 2023-09-03 DIAGNOSIS — Z794 Long term (current) use of insulin: Secondary | ICD-10-CM

## 2023-09-03 DIAGNOSIS — R112 Nausea with vomiting, unspecified: Secondary | ICD-10-CM | POA: Diagnosis not present

## 2023-09-03 LAB — CBC
HCT: 33.5 % — ABNORMAL LOW (ref 39.0–52.0)
Hemoglobin: 10.4 g/dL — ABNORMAL LOW (ref 13.0–17.0)
MCH: 19.7 pg — ABNORMAL LOW (ref 26.0–34.0)
MCHC: 31 g/dL (ref 30.0–36.0)
MCV: 63.6 fL — ABNORMAL LOW (ref 80.0–100.0)
Platelets: 174 10*3/uL (ref 150–400)
RBC: 5.27 MIL/uL (ref 4.22–5.81)
RDW: 16.6 % — ABNORMAL HIGH (ref 11.5–15.5)
WBC: 7.6 10*3/uL (ref 4.0–10.5)
nRBC: 0 % (ref 0.0–0.2)

## 2023-09-03 LAB — RENAL FUNCTION PANEL
Albumin: 3.2 g/dL — ABNORMAL LOW (ref 3.5–5.0)
Anion gap: 7 (ref 5–15)
BUN: 9 mg/dL (ref 6–20)
CO2: 27 mmol/L (ref 22–32)
Calcium: 8.5 mg/dL — ABNORMAL LOW (ref 8.9–10.3)
Chloride: 103 mmol/L (ref 98–111)
Creatinine, Ser: 0.82 mg/dL (ref 0.61–1.24)
GFR, Estimated: >60 mL/min (ref >60)
Glucose, Bld: 184 mg/dL — ABNORMAL HIGH (ref 70–99)
Phosphorus: 2.9 mg/dL (ref 2.5–4.6)
Potassium: 4.1 mmol/L (ref 3.5–5.1)
Sodium: 137 mmol/L (ref 135–145)

## 2023-09-03 LAB — GLUCOSE, CAPILLARY: Glucose-Capillary: 178 mg/dL — ABNORMAL HIGH (ref 70–99)

## 2023-09-03 LAB — CULTURE, BLOOD (ROUTINE X 2)
Culture: NO GROWTH
Culture: NO GROWTH

## 2023-09-03 MED ORDER — LISINOPRIL 40 MG PO TABS
40.0000 mg | ORAL_TABLET | Freq: Every day | ORAL | 3 refills | Status: DC
  Filled 2023-09-03 (×2): qty 30, 30d supply, fill #0
  Filled 2023-09-15 – 2023-09-30 (×2): qty 30, 30d supply, fill #1
  Filled 2023-10-25 – 2023-11-02 (×2): qty 30, 30d supply, fill #2
  Filled 2023-11-30 – 2023-12-02 (×2): qty 30, 30d supply, fill #3

## 2023-09-03 MED ORDER — CARVEDILOL 12.5 MG PO TABS
12.5000 mg | ORAL_TABLET | Freq: Two times a day (BID) | ORAL | 3 refills | Status: DC
  Filled 2023-09-03 (×2): qty 60, 30d supply, fill #0
  Filled 2023-09-15 – 2023-09-30 (×2): qty 60, 30d supply, fill #1
  Filled 2023-10-25 – 2023-11-02 (×2): qty 60, 30d supply, fill #2
  Filled 2023-11-30 – 2023-12-02 (×2): qty 60, 30d supply, fill #3

## 2023-09-03 MED ORDER — GUAIFENESIN-DM 100-10 MG/5ML PO SYRP
5.0000 mL | ORAL_SOLUTION | ORAL | 0 refills | Status: DC | PRN
  Filled 2023-09-03: qty 118, 4d supply, fill #0

## 2023-09-03 MED ORDER — PEN NEEDLES 31G X 5 MM MISC
1.0000 | Freq: Three times a day (TID) | 0 refills | Status: DC
  Filled 2023-09-03 (×2): qty 100, 30d supply, fill #0

## 2023-09-03 MED ORDER — INSULIN ASPART 100 UNIT/ML FLEXPEN
4.0000 [IU] | PEN_INJECTOR | Freq: Three times a day (TID) | SUBCUTANEOUS | 3 refills | Status: AC
  Filled 2023-09-03: qty 6, 50d supply, fill #0
  Filled 2023-10-19: qty 6, 50d supply, fill #1
  Filled 2023-11-30 – 2023-12-02 (×2): qty 6, 50d supply, fill #2
  Filled 2024-01-27 – 2024-01-28 (×2): qty 6, 50d supply, fill #3
  Filled 2024-03-17: qty 6, 50d supply, fill #4
  Filled 2024-03-31 – 2024-05-06 (×2): qty 6, 50d supply, fill #5

## 2023-09-03 MED ORDER — LANTUS SOLOSTAR 100 UNIT/ML ~~LOC~~ SOPN
12.0000 [IU] | PEN_INJECTOR | Freq: Every day | SUBCUTANEOUS | 2 refills | Status: AC
  Filled 2023-09-03 – 2023-09-04 (×2): qty 15, 125d supply, fill #0
  Filled 2023-09-06 – 2023-09-08 (×3): qty 9, 75d supply, fill #0
  Filled 2023-11-15 – 2023-11-22 (×2): qty 9, 75d supply, fill #1
  Filled 2024-01-29: qty 9, 75d supply, fill #2
  Filled 2024-04-05: qty 9, 75d supply, fill #3

## 2023-09-03 MED ORDER — ACCU-CHEK GUIDE W/DEVICE KIT
1.0000 | PACK | Freq: Three times a day (TID) | 0 refills | Status: AC
  Filled 2023-09-03 (×4): qty 1, 30d supply, fill #0

## 2023-09-03 MED ORDER — ONDANSETRON 4 MG PO TBDP
4.0000 mg | ORAL_TABLET | Freq: Three times a day (TID) | ORAL | 0 refills | Status: AC | PRN
  Filled 2023-09-03 (×2): qty 30, 10d supply, fill #0

## 2023-09-03 MED ORDER — BLOOD GLUCOSE TEST VI STRP
1.0000 | ORAL_STRIP | Freq: Three times a day (TID) | 0 refills | Status: AC
  Filled 2023-09-03: qty 100, 30d supply, fill #0
  Filled 2023-09-03: qty 100, 34d supply, fill #0

## 2023-09-03 MED ORDER — PANTOPRAZOLE SODIUM 40 MG PO TBEC
40.0000 mg | DELAYED_RELEASE_TABLET | Freq: Every day | ORAL | 3 refills | Status: DC
  Filled 2023-09-03 (×2): qty 30, 30d supply, fill #0
  Filled 2023-09-15 – 2023-09-30 (×2): qty 30, 30d supply, fill #1
  Filled 2023-10-25 – 2023-11-02 (×2): qty 30, 30d supply, fill #2
  Filled 2023-11-30 – 2023-12-02 (×2): qty 30, 30d supply, fill #3

## 2023-09-03 MED ORDER — FOOD THICKENER (SIMPLYTHICK HONEY)
10.0000 | ORAL | 2 refills | Status: AC | PRN
  Filled 2023-09-03: qty 120, fill #0

## 2023-09-03 MED ORDER — BENZONATATE 200 MG PO CAPS
200.0000 mg | ORAL_CAPSULE | Freq: Three times a day (TID) | ORAL | 0 refills | Status: DC | PRN
  Filled 2023-09-03 (×2): qty 30, 10d supply, fill #0

## 2023-09-03 MED ORDER — ACCU-CHEK SOFTCLIX LANCET DEV KIT
1.0000 | PACK | Freq: Three times a day (TID) | 0 refills | Status: AC
  Filled 2023-09-03 (×2): qty 1, 30d supply, fill #0

## 2023-09-03 MED ORDER — LANCETS MISC
1.0000 | Freq: Three times a day (TID) | 0 refills | Status: AC
  Filled 2023-09-03: qty 100, 34d supply, fill #0
  Filled 2023-09-03: qty 100, 30d supply, fill #0

## 2023-09-03 NOTE — Plan of Care (Signed)
  Problem: Education: Goal: Knowledge of General Education information will improve Description: Including pain rating scale, medication(s)/side effects and non-pharmacologic comfort measures Outcome: Progressing   Problem: Health Behavior/Discharge Planning: Goal: Ability to manage health-related needs will improve Outcome: Progressing   Problem: Clinical Measurements: Goal: Ability to maintain clinical measurements within normal limits will improve Outcome: Progressing Goal: Will remain free from infection Outcome: Progressing Goal: Diagnostic test results will improve Outcome: Progressing Goal: Respiratory complications will improve Outcome: Progressing Goal: Cardiovascular complication will be avoided Outcome: Progressing   Problem: Activity: Goal: Risk for activity intolerance will decrease Outcome: Progressing   Problem: Nutrition: Goal: Adequate nutrition will be maintained Outcome: Progressing   Problem: Coping: Goal: Level of anxiety will decrease Outcome: Progressing   Problem: Elimination: Goal: Will not experience complications related to bowel motility Outcome: Progressing Goal: Will not experience complications related to urinary retention Outcome: Progressing   Problem: Pain Managment: Goal: General experience of comfort will improve and/or be controlled Outcome: Progressing   Problem: Safety: Goal: Ability to remain free from injury will improve Outcome: Progressing   Problem: Skin Integrity: Goal: Risk for impaired skin integrity will decrease Outcome: Progressing   Problem: Fluid Volume: Goal: Hemodynamic stability will improve Outcome: Progressing   Problem: Clinical Measurements: Goal: Diagnostic test results will improve Outcome: Progressing Goal: Signs and symptoms of infection will decrease Outcome: Progressing   Problem: Respiratory: Goal: Ability to maintain adequate ventilation will improve Outcome: Progressing   Problem:  Education: Goal: Ability to describe self-care measures that may prevent or decrease complications (Diabetes Survival Skills Education) will improve Outcome: Progressing Goal: Individualized Educational Video(s) Outcome: Progressing   Problem: Coping: Goal: Ability to adjust to condition or change in health will improve Outcome: Progressing   Problem: Fluid Volume: Goal: Ability to maintain a balanced intake and output will improve Outcome: Progressing   Problem: Health Behavior/Discharge Planning: Goal: Ability to identify and utilize available resources and services will improve Outcome: Progressing Goal: Ability to manage health-related needs will improve Outcome: Progressing   Problem: Metabolic: Goal: Ability to maintain appropriate glucose levels will improve Outcome: Progressing   Problem: Skin Integrity: Goal: Risk for impaired skin integrity will decrease Outcome: Progressing   Problem: Tissue Perfusion: Goal: Adequacy of tissue perfusion will improve Outcome: Progressing

## 2023-09-03 NOTE — Inpatient Diabetes Management (Addendum)
 Inpatient Diabetes Program Recommendations  AACE/ADA: New Consensus Statement on Inpatient Glycemic Control (2015)  Target Ranges:  Prepandial:   less than 140 mg/dL      Peak postprandial:   less than 180 mg/dL (1-2 hours)      Critically ill patients:  140 - 180 mg/dL   Lab Results  Component Value Date   GLUCAP 178 (H) 09/03/2023   HGBA1C 8.9 (H) 08/31/2023    Review of Glycemic Control  Latest Reference Range & Units 09/02/23 07:46 09/02/23 12:00 09/02/23 16:56 09/02/23 20:27 09/03/23 07:38  Glucose-Capillary 70 - 99 mg/dL 469 (H) 629 (H) 528 (H) 169 (H) 178 (H)  (H): Data is abnormally high  Diabetes history: DM2 Outpatient Diabetes medications: Lantus  40 at bedtime, Jardiance  25 daily, Ozempic  0.5 weekly Current orders for Inpatient glycemic control: Semglee  12 daily, Novolog  0-9 TID with meals and 0-5 HS, Novolog  3 units TID  Met with patient at bedside.  He states he has been taking Ozempic  for approximately 6 months.  Presents with N/V and hyperglycemia.  He has not been taking Lantus  since he started Ozempic .  He no longer takes Metformin ; he will not be taking Ozempic  anymore due to GI effects.  Explained to patient he will be restarting his Lantus  12 units QAM and Novolog  4 units TID with meals.  He has never taken rapid insulin  before.  I explained how rapid insulin  works & when to take.  We reviewed hypoglycemia, signs, symptoms and treatments.  He normally wears a [employer]; he has sensors at home; he also has a glucometer for back up.  He is current with his PCP.  He drinks beverages with sugar.  Encouraged him to avoid caloric beverages and be mindful of CHO's.  He verbalizes understanding.    Discharge Recommendations: Other recommendations: Stop Jardiance  Long acting recommendations: Insulin  Glargine (LANTUS ) Solostar Pen 12 units QAM  Short acting recommendations:  Meal coverage ONLY Insulin  aspart (NOVOLOG ) FlexPen  4 units TID with meals     Use Adult  Diabetes Insulin  Treatment Post Discharge order set.  Will continue to follow while inpatient.  Thank you, Hays Lipschutz, MSN, CDCES Diabetes Coordinator Inpatient Diabetes Program [phone] (team pager from 8a-5p)

## 2023-09-03 NOTE — Discharge Summary (Signed)
 Physician Discharge Summary   Patient: Frank Schneider MRN: [id number] DOB: 03/20/1976  Admit date:     08/29/2023  Discharge date: 09/03/23  Discharge Physician: Bertram Brocks, MD    PCP: Jerrlyn Morel, NP   Recommendations at discharge:   Continue Lantus  Solostar pen 12 units daily Meal coverage NovoLog  FlexPen 4 units 3 times daily with meals Stop Jardiance  and hold Ozempic  Continue full liquid diet for at least 2 weeks, follow-up with GI, Dr. Lavaughn Portland, may need outpatient endoscopy  Discharge Diagnoses:   SIRS with gastritis, possible gastroenteritis versus gastroparesis due to Ozempic    Lactic acidosis Uncontrolled hypertension/hypertensive urgency Hypokalemia   Hyperlipidemia   History of CVA (cerebrovascular accident)   Leukocytosis   Blind right eye   Anxiety and depression   Type 2 diabetes mellitus with hyperglycemia uncontrolled   Atherosclerosis of native arteries of extremity with intermittent claudication (HCC)   Abdominal pain   Nausea and vomiting Noncompliance Cocaine use  Hospital Course:  Patient is a 48 year old male with tobacco use, PAD, diabetes eliquis, HTN, prior history of stroke presented to the hospital with 3 hours of nausea and vomiting.  He reported history of intermittent compliance with insulin  and Ozempic , no longer on metformin .  A night before the admission, he had some alcohol  and pork skins which he says is unusual for him.  Also reported in the ED that some of his emesis had blood streaks.  No chest pain, shortness of breath, headaches or dysuria.  Blood sugar was 281. In ED, found to have lactic acidosis, was started on broad-spectrum antibiotics, vancomycin  and cefepime .  Due to increasing lactic acid level despite volume resuscitation and several episodes of vomiting, uncontrolled hypertension with SBP in 200s, patient was admitted to ICU. 5/7: Transfered to stepdown unit and TRH assumed care   Assessment and Plan:  SIRS. Possible  gastritis, gastroenteritis versus gastroparesis - Possible gastroparesis due to Ozempic , not on metformin  - Continue full liquid diet, thickened liquids  - Completed IV cefepime , Flagyl .   - Patient underwent SLP evaluations by speech therapy, recommended for liquid diet - Please is tolerating full liquid diet.  Discussed with GI Dr. Lavaughn Portland, recommended to hold off Ozempic , stay on full liquid diet if patient can tolerate for a couple of weeks.  Plan on outpatient EGD if no significant improvement.      Uncontrolled hypertension - Patient was placed on Cleviprex  drip in ICU for BP control.   - Continue lisinopril , amlodipine , Coreg   - 2D echo showed EF of 70 to 75%, no regional WMA, normal diastolic parameters   Lactic acidosis -Initial lactic acid 4.0, trended up to 6.5, received IV fluid hydration - Now resolved   Hypokalemia - Replaced   Diabetes mellitus, uncontrolled with hyperglycemia - Hemoglobin A1c 8.9, was on Jardiance , Ozempic , Lantus  outpatient, noncompliant, not on metformin  - Diabetic coordinator consult was obtained - Given symptoms could be from Ozempic , it was placed on hold -Continue Lantus  Solostar pen 12 units daily, NovoLog  meal coverage 4 units 3 times daily AC.  Stopped Jardiance      Intermittent claudication, PAD - Continue Pletal      History of right MCA CVA -Continue aspirin , statin     Chronic leukocytosis - Resolved   Cocaine use UDS positive for cocaine, patient counseled on cessation   Estimated body mass index is 22.96 kg/m as calculated from the following:   Height as of this encounter: 5\' 10"  (1.778 m).   Weight as of this encounter: 72.6 kg.  Pain control - [place]  Controlled Substance Reporting System database was reviewed. and patient was instructed, not to drive, operate heavy machinery, perform activities at heights, swimming or participation in water activities or provide baby-sitting services while on Pain, Sleep and  Anxiety Medications; until their outpatient Physician has advised to do so again. Also recommended to not to take more than prescribed Pain, Sleep and Anxiety Medications.  Consultants: GI consult on phone, CCM Procedures performed:   Disposition: Home Diet recommendation:  Discharge Diet Orders (From admission, onward)     Start     Ordered   09/03/23 0000  Diet Carb Modified        09/03/23 0955            DISCHARGE MEDICATION: Allergies as of 09/03/2023   No Known Allergies      Medication List     PAUSE taking these medications    Ozempic  (0.25 or 0.5 MG/DOSE) 2 MG/3ML Sopn Wait to take this until your doctor or other care provider tells you to start again. Generic drug: Semaglutide (0.25 or 0.5MG /DOS) Inject 0.5 mg into the skin once a week. Ok to fill early       STOP taking these medications    ibuprofen  800 MG tablet Commonly known as: ADVIL    Jardiance  25 MG Tabs tablet Generic drug: empagliflozin        TAKE these medications    Accu-Chek Guide Test test strip Generic drug: glucose blood 1 each by Does not apply route 3 (three) times daily. Use as directed to check blood sugar. May dispense any manufacturer covered by patient's insurance and fits patient's device.   Accu-Chek Guide w/Device Kit Use to test blood sugar 3 times daily   Accu-Chek Softclix Lancet Dev Kit Use 3 times daily   Accu-Chek Softclix Lancets lancets Use in the morning and at bedtime What changed: Another medication with the same name was added. Make sure you understand how and when to take each.   Lancets Misc 1 each by Does not apply route 3 (three) times daily. Use as directed to check blood sugar. May dispense any manufacturer covered by patient's insurance and fits patient's device. What changed: You were already taking a medication with the same name, and this prescription was added. Make sure you understand how and when to take each.   acetaminophen  500 MG  tablet Commonly known as: TYLENOL  Take 2 tablets (Total= 1,000 mg), by mouth, 2 times a day as needed.   amLODipine  10 MG tablet Commonly known as: NORVASC  Take 1 tablet (10 mg total) by mouth daily.   aspirin  325 MG tablet Take 1 tablet (325 mg total) by mouth daily.   atorvastatin  40 MG tablet Commonly known as: Lipitor Take 1 tablet (40 mg total) by mouth daily.   benzonatate  200 MG capsule Commonly known as: TESSALON  Take 1 capsule (200 mg total) by mouth 3 (three) times daily as needed for cough.   carvedilol  12.5 MG tablet Commonly known as: COREG  Take 1 tablet (12.5 mg total) by mouth 2 (two) times daily with a meal.   cilostazol  100 MG tablet Commonly known as: PLETAL  Take 1 tablet (100 mg total) by mouth 2 (two) times daily before a meal.   dicyclomine  20 MG tablet Commonly known as: BENTYL  Take 1 tablet (20 mg total) by mouth 2 (two) times daily as needed for spasms.   food thickener Liqd Commonly known as: SIMPLYTHICK (HONEY/LEVEL 3/MODERATELY THICK) Take 10 packets by mouth as needed.  FreeStyle Libre 3 Sensor Misc Place 1 sensor on the skin every 14 days. Use to check glucose continuously   guaiFENesin -dextromethorphan 100-10 MG/5ML syrup Commonly known as: ROBITUSSIN DM Take 5 mLs by mouth every 4 (four) hours as needed for cough.   insulin  aspart 100 UNIT/ML FlexPen Commonly known as: NOVOLOG  Inject 4 Units into the skin 3 (three) times daily with meals. Only take if eating a meal AND Blood Glucose (BG) is 80 or higher.   Lantus  SoloStar 100 UNIT/ML Solostar Pen Generic drug: insulin  glargine Inject 12 Units into the skin at bedtime. What changed: how much to take   lisinopril  40 MG tablet Commonly known as: ZESTRIL  Take 1 tablet (40 mg total) by mouth daily. What changed:  medication strength how much to take   loperamide  2 MG tablet Commonly known as: Imodium  A-D Take 1 tablet (2 mg total) by mouth 4 (four) times daily as needed for  diarrhea or loose stools.   ondansetron  4 MG disintegrating tablet Commonly known as: ZOFRAN -ODT Take 1 tablet (4 mg total) by mouth every 8 (eight) hours as needed for nausea or vomiting.   pantoprazole  40 MG tablet Commonly known as: PROTONIX  Take 1 tablet (40 mg total) by mouth daily at 12 noon.   Pen Needles 31G X 5 MM Misc 1 each by Does not apply route 3 (three) times daily. May dispense any manufacturer covered by patient's insurance.        Follow-up Information     [facility] Follow up.   Why: Please follow up with this provider for home health services. Contact information: [address] [place] [address] [phone]         Jerrlyn Morel, NP. Schedule an appointment as soon as possible for a visit in 2 week(s).   Specialties: Pulmonary Disease, Endocrinology Why: for hospital follow-up Contact information: [address] [place] [address] [phone]         Ozell Blunt, MD. Schedule an appointment as soon as possible for a visit in 2 week(s).   Specialty: Gastroenterology Why: for hospital follow-up Contact information: [address] [place] [address] [phone]                Discharge Exam: Frank Schneider Weights   08/29/23 2440  Weight: 72.6 kg   S: No acute complaints, wants to go home today.  Tolerating full liquid diet   BP (!) 159/72 (BP Location: Left Arm)   Pulse 73   Temp 98.6 F (37 C)   Resp 20   Ht 5\' 10"  (1.778 m)   Wt 72.6 kg   SpO2 100%   BMI 22.96 kg/m   Physical Exam General: Alert and oriented x 3, NAD Cardiovascular: S1 S2 clear, RRR.  Respiratory: CTAB, no wheezing, rales or rhonchi Gastrointestinal: Soft, nontender, nondistended, NBS Ext: no pedal edema bilaterally Neuro: no new deficits Psych: Normal affect    Condition at discharge: fair  The results of significant diagnostics from this hospitalization (including imaging,  microbiology, ancillary and laboratory) are listed below for reference.   Imaging Studies: DG Swallowing Func-Speech Pathology Result Date: 09/01/2023 Table formatting from the original result was not included. Images from the original result were not included. Modified Barium Swallow Study Patient Details Name: Frank Schneider MRN: [id number] Date of Birth: 1976/01/07 Today's Date: 09/01/2023 HPI/PMH: HPI: Patient is a 48 y.o. male with PMH: CVA (2020), eye trauma with right eye  removal, HTN, DM-. He presented to the ED on 08/29/23 with hyperglycemia, diffuse abdominal pain, nausea and multiple episodes of emesis. CT ab/pelvis with contrast did not show acute abdominal or pelvic pathology and portable CXR was negative. SLP swallow evaluation ordered on 08/31/23 after RN observed patient to have coughing episode when drinking liquids. Clinical Impression: Clinical Impression: Patient presents with a moderately impaired oroopharyngeal dysphagia as per this MBS. 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 Cervical osteophyte resulted in only minimal epiglottic inversion and incomplete laryngeal vestibule closure. Swallow was initiated at level of pyriform sinus with thin, nectar thick and honey thick liquids and at level of vallecular sinus with puree solids. Silent aspiration (PAS 8) occured with thin and nectar thick liquids during the swallow as well as after from penetrate that had not cleared. Cued cough was effective to clear penetrates from laryngeal vestibule but ineffective to clear aspirate. Initially, honey thick liquids appeared to be well tolerated, however towards end of study, patient with silent aspiration of gross amount of honey thick barium which occured when oral residuals transited down into open airway before swallow was triggered. Puree/pudding thick was the only consistency that did not result  in any incidents of penetration or aspiration. SLP recommending to change PO diet to full liquids/honey thick. Factors that may increase risk of adverse event in presence of aspiration Roderick Civatte & Jessy Morocco 2021): Factors that may increase risk of adverse event in presence of aspiration Roderick Civatte & Jessy Morocco 2021): Reduced cognitive function; Frail or deconditioned; Frequent aspiration of large volumes; Poor general health and/or compromised immunity Recommendations/Plan: Swallowing Evaluation Recommendations Swallowing Evaluation Recommendations Recommendations: PO diet PO Diet Recommendation: Full liquid diet; Extremely thick liquids (Level 4, pudding thick) Liquid Administration via: Spoon Medication Administration: Whole meds with puree Supervision: Patient able to self-feed; Intermittent supervision/cueing for swallowing strategies; Set-up assistance for safety Swallowing strategies  : Slow rate; Small bites/sips Postural changes: Position pt fully upright for meals Oral care recommendations: Oral care BID (2x/day) Caregiver Recommendations: Avoid jello, ice cream, thin soups, popsicles; Have oral suction available; Remove water pitcher Treatment Plan Treatment Plan Treatment recommendations: Therapy as outlined in treatment plan below Follow-up recommendations: Other (comment) (SLP at next venue of care) Functional status assessment: Patient has had a recent decline in their functional status and demonstrates the ability to make significant improvements in function in a reasonable and predictable amount of time. Treatment frequency: Min 2x/week Treatment duration: 2 weeks Interventions: Trials of upgraded texture/liquids; Diet toleration management by SLP; Patient/family education Recommendations Recommendations for follow up therapy are one component of a multi-disciplinary discharge planning process, led by the attending physician.  Recommendations may be updated based on patient status, additional functional  criteria and insurance authorization. 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) Boluses Administered: Boluses Administered Boluses Administered: Thin liquids (Level 0); Mildly thick liquids (Level 2, nectar thick); Moderately thick liquids (Level 3, honey thick); Solid; Puree  Oral Impairment Domain: Oral Impairment Domain Lip Closure: No labial escape Tongue control during bolus hold: Cohesive bolus between tongue to palatal seal Bolus preparation/mastication: Timely and efficient chewing and mashing Bolus transport/lingual motion: Brisk tongue motion Oral residue: Residue collection on oral structures Location of oral residue : Tongue Initiation of pharyngeal swallow : Pyriform sinuses; Valleculae  Pharyngeal Impairment Domain: Pharyngeal Impairment Domain Soft palate elevation: No bolus between soft palate (SP)/pharyngeal wall (PW) Laryngeal elevation: Minimal superior movement of thyroid  cartilage with minimal approximation of arytenoids to epiglottic petiole Anterior hyoid excursion: Complete anterior movement Epiglottic movement: Partial inversion Laryngeal vestibule closure: Incomplete, narrow column air/contrast in laryngeal vestibule Pharyngeal stripping wave : Present - diminished Pharyngeal contraction (A/P view only): N/A Pharyngoesophageal segment opening: Complete distension and complete duration, no obstruction of flow Tongue base retraction: Trace column of contrast or air between tongue base and PPW Pharyngeal residue: Collection of residue within or on pharyngeal structures Location of pharyngeal residue: Pharyngeal wall; Tongue base  Esophageal Impairment Domain: Esophageal Impairment Domain Esophageal clearance upright position: Complete clearance, esophageal coating  Pill: No data recorded Penetration/Aspiration Scale Score: Penetration/Aspiration Scale Score 1.  Material does not enter airway: Puree; Solid 5.  Material enters airway, CONTACTS cords and not ejected out: Thin liquids (Level 0); Mildly thick liquids (Level 2, nectar thick) 8.  Material enters airway, passes BELOW cords without attempt by patient to eject out (silent aspiration) : Moderately thick liquids (Level 3, honey thick); Mildly thick liquids (Level 2, nectar thick); Thin liquids (Level 0) Compensatory Strategies: Compensatory Strategies Compensatory strategies: Yes Chin tuck: Ineffective Ineffective Chin Tuck: Thin liquid (Level 0)   General Information: No data recorded Diet Prior to this Study: Thin liquids (Level 0); Full liquid diet   No data recorded  No data recorded  Supplemental O2: None (Room air)   History of Recent Intubation: No  Behavior/Cognition: Alert; Cooperative; Pleasant mood Self-Feeding Abilities: Able to self-feed Baseline vocal quality/speech: Hypophonia/low volume Volitional Cough: Able to elicit Volitional Swallow: Able to elicit Exam Limitations: No limitations Goal Planning: Prognosis for improved oropharyngeal function: Fair Barriers to Reach Goals: Time post onset; Severity of deficits; Cognitive deficits No data recorded Patient/Family Stated Goal: patient reports he is hungry Consulted and agree with results and recommendations: Pt unable/family or caregiver not available; Nurse Pain: Pain Assessment Pain Assessment: No/denies pain End of Session: Start Time:SLP Start Time (ACUTE ONLY): 1700 Stop Time: SLP Stop Time (ACUTE ONLY): 1715 Time Calculation:SLP Time Calculation (min) (ACUTE ONLY): 15 min Charges: SLP Evaluations $ SLP Speech Visit: 1 Visit SLP Evaluations $BSS Swallow: 1 Procedure SLP visit diagnosis: SLP Visit Diagnosis: Dysphagia, oropharyngeal phase (R13.12) Past Medical History: Past Medical History: Diagnosis Date  Anticoagulated   ASA 325 mg--- ;managed by pcp   (stroke prevention)  Beta thalassemia trait   hematology/ oncologist--- dr Maryalice Smaller (lov in epic 11-11-2022  elevated A2  Chronic foot pain   Diabetic neuropathy (HCC)   DOE (dyspnea on exertion)   02-23-2023  per pt sob w/ stairs , yard work, and ok with household chores,  does not do any time of walking or exercise  History of cerebrovascular accident (CVA) with residual deficit 12/31/2014  admission in epic;   right thalamic infarct second SVD residual LUE / LLE numb/ ting with sensory deficit  History of eye trauma   02-23-2023  per pt eye injury age late 20s , never seek medical attention,  vision became progressive worse over time;  then pt had stroke 01/ 2020 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,  s/p eye removal 02/ 2024  History of ischemic stroke 05/09/2015  admission in epic;   acute ischemia right ventral pons,  pt was taking his plavix   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 Fulton Job;  taking pletal  twice daily  Leucocytosis   Numbness and tingling of left arm and leg   stroke residual,  sensory deficit resolve  Type 2 diabetes mellitus (HCC)   followed by pcp;   (02-23-2023  per pt has Libre 3,  blood sugar check multiple times daily,  stated average fasting sugar 120s,  stopped doing lantus  daily approx 09/ 2024,  only taking jardiance  daily and weely ozempic )  Vision abnormalities   Vitamin D  deficiency 12/2019  Wears  glasses  Past Surgical History: Past Surgical History: Procedure Laterality Date  CATARACT EXTRACTION W/ INTRAOCULAR LENS IMPLANT Right 05/27/2016  EVISCERATION Right 06/11/2022  Procedure: EVISCERATION REPAIR WITH POSSIBLE INCLUSION;  Surgeon: Debbra Fairy, MD;  Location: [facility] OR;  Service: Ophthalmology;  Laterality: Right;  INCISION AND DRAINAGE PERIRECTAL ABSCESS  07/28/2011  Procedure: IRRIGATION AND DEBRIDEMENT PERIRECTAL ABSCESS;  Surgeon: Levert Ready, MD;  Location: [facility] ORS;  Service: General;  Laterality: N/A;   of skin muscle and subcutaneous tissue of perimeum  8cmx12cm area   IR GENERIC HISTORICAL  01/08/2016  IR RADIOLOGIST EVAL & MGMT 01/08/2016 [facility] INTERV RAD  LAPAROSCOPIC APPENDECTOMY N/A 12/16/2015  Procedure: APPENDECTOMY LAPAROSCOPIC;  Surgeon: Oza Blumenthal, MD;  Location: [facility] ORS;  Service: General;  Laterality: N/A;  PRIAPISM REPAIR    age 12s   (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 Exam Location:  Inpatient Procedure: 2D Echo, Cardiac Doppler and Color Doppler (Both Spectral and Color            Flow Doppler were utilized during procedure). Indications:    Other Abnormalities of the heart  History:        Patient has prior history of Echocardiogram examinations.                 Stroke; Risk Factors:Current Smoker, Hypertension and Diabetes.  Sonographer:    Willey Harrier Referring Phys: [id number] LAURA P CLARK IMPRESSIONS  1. Left ventricular ejection fraction, by estimation, is 70 to 75%. The left  ventricle has hyperdynamic function. The left ventricle has no regional wall motion abnormalities. There is moderate concentric  left ventricular hypertrophy. Left ventricular diastolic parameters were normal.  2. Right ventricular systolic function is normal. The right ventricular size is normal.  3. Left atrial size was mildly dilated.  4. The mitral valve is grossly normal. Trivial mitral valve regurgitation. No evidence of mitral stenosis.  5. The aortic valve is tricuspid. There is mild calcification of the aortic valve. Aortic valve regurgitation is not visualized. No aortic stenosis is present. Comparison(s): Changes from prior study are noted. LV now hyperdynamic. Conclusion(s)/Recommendation(s): Otherwise normal echocardiogram, with minor abnormalities described in the report. FINDINGS  Left Ventricle: Hyperdynamic LV, peak intracavitary gradient 44 mmHg. Left ventricular ejection fraction, by estimation, is 70 to 75%. The left ventricle has hyperdynamic function. The left ventricle has no regional wall motion abnormalities. The left ventricular internal cavity size was normal in size. There is moderate concentric left ventricular hypertrophy. Left ventricular diastolic parameters were normal. Right Ventricle: The right ventricular size is normal. No increase in right ventricular wall thickness. Right ventricular systolic function is normal. Left Atrium: Left atrial size was mildly dilated. Right Atrium: Right atrial size was normal in size. Pericardium: There is no evidence of pericardial effusion. Mitral Valve: The mitral valve is grossly normal. Trivial mitral valve regurgitation. No evidence of mitral valve stenosis. MV peak gradient, 8.1 mmHg. The mean mitral valve gradient is 3.0 mmHg. Tricuspid Valve: The tricuspid valve is not well visualized. Tricuspid valve regurgitation is trivial. No evidence of tricuspid stenosis. Aortic Valve: The aortic valve is tricuspid. There is mild calcification of the  aortic valve. Aortic valve regurgitation is not visualized. No aortic stenosis is present. Aortic valve peak gradient measures 13.4 mmHg. Pulmonic Valve: The pulmonic valve was not well visualized. Pulmonic valve regurgitation is not visualized. No evidence of pulmonic stenosis. Aorta: The aortic arch was not well visualized and the aortic root and ascending aorta are structurally normal, with no evidence of dilitation. Venous: The inferior vena cava was not well visualized. IAS/Shunts: The atrial septum is grossly normal.  LEFT VENTRICLE PLAX 2D LVIDd:         4.30 cm   Diastology LVIDs:         2.40 cm   LV e' medial:    7.18 cm/s LV PW:         1.60 cm   LV E/e' medial:  12.6 LV IVS:        1.50 cm   LV e' lateral:   8.59 cm/s LVOT diam:     2.20 cm   LV E/e' lateral: 10.6 LV SV:         97 LV SV Index:   51 LVOT Area:     3.80 cm  RIGHT VENTRICLE RV S prime:     21.60 cm/s TAPSE (M-mode): 2.8 cm LEFT ATRIUM             Index        RIGHT ATRIUM           Index LA diam:        4.30 cm 2.26 cm/m   RA Area:     15.20 cm LA Vol (A2C):   52.3 ml 27.55 ml/m  RA Volume:   41.40 ml  21.81 ml/m LA Vol (A4C):   60.8 ml 32.03 ml/m LA Biplane Vol: 57.8 ml 30.45 ml/m  AORTIC VALVE AV Area (Vmax): 2.68 cm AV Vmax:        183.00 cm/s AV Peak Grad:   13.4 mmHg LVOT Vmax:  129.00 cm/s LVOT Vmean:     97.300 cm/s LVOT VTI:       0.255 m  AORTA Ao Root diam: 3.20 cm Ao Asc diam:  2.90 cm MITRAL VALVE                TRICUSPID VALVE MV Area (PHT): 3.53 cm     TR Peak grad:   7.3 mmHg MV Area VTI:   2.65 cm     TR Vmax:        135.00 cm/s MV Peak grad:  8.1 mmHg MV Mean grad:  3.0 mmHg     SHUNTS MV Vmax:       1.42 m/s     Systemic VTI:  0.26 m MV Vmean:      81.8 cm/s    Systemic Diam: 2.20 cm MV Decel Time: 215 msec MV E velocity: 90.70 cm/s MV A velocity: 106.00 cm/s MV E/A ratio:  0.86 Sheryle Donning MD Electronically signed by Sheryle Donning MD Signature Date/Time: 08/30/2023/12:38:48 PM    Final     CT ABDOMEN PELVIS W CONTRAST Result Date: 08/29/2023 CLINICAL DATA:  Hyperglycemia, nausea and vomiting. EXAM: CT ABDOMEN AND PELVIS WITH CONTRAST TECHNIQUE: Multidetector CT imaging of the abdomen and pelvis was performed using the standard protocol following bolus administration of intravenous contrast. RADIATION DOSE REDUCTION: This exam was performed according to the departmental dose-optimization program which includes automated exposure control, adjustment of the mA and/or kV according to patient size and/or use of iterative reconstruction technique. CONTRAST:  OMNIPAQUE  IOHEXOL  300 MG/ML  SOLN COMPARISON:  03/24/2023. FINDINGS: Lower chest: Dependent basilar subsegmental atelectasis or minimal consolidation on the right. No pleural or pericardial effusions. Hepatobiliary: No focal liver abnormality is seen. No gallstones, gallbladder wall thickening, or biliary dilatation. Pancreas: Unremarkable. No pancreatic ductal dilatation or surrounding inflammatory changes. Spleen: Normal in size without focal abnormality. Adrenals/Urinary Tract: Adrenal glands are unremarkable. Kidneys are normal, without renal calculi, focal lesion, or hydronephrosis. Bladder is unremarkable. Stomach/Bowel: Stomach is within normal limits. Appendix appears normal. No evidence of bowel wall thickening, distention, or inflammatory changes. Vascular/Lymphatic: Aortic atherosclerosis. No enlarged abdominal or pelvic lymph nodes. Reproductive: Prostate is unremarkable. Other: No abdominal wall hernia or abnormality. No abdominopelvic ascites. Musculoskeletal: Thoracolumbar degenerative changes. IMPRESSION: 1. No acute abdominal or pelvic pathology identified. 2. Dependent basilar subsegmental atelectasis or minimal consolidation on the right. 3. Aortic atherosclerosis (ICD10-I70.0). Electronically Signed   By: Sydell Eva M.D.   On: 08/29/2023 08:40   DG Abd Portable 1V Result Date: 08/29/2023 CLINICAL DATA:  48 year old  male with vomiting and abdominal pain. EXAM: PORTABLE ABDOMEN - 1 VIEW COMPARISON:  CT Abdomen and Pelvis 03/24/2023. FINDINGS: Portable AP supine view at 0719 hours. Non obstructed bowel gas pattern. Multiple external wires and leads project over the abdomen. Pronounced calcified iliofemoral atherosclerosis. Negative visible osseous structures. IMPRESSION: Normal bowel gas pattern. Age advanced atherosclerosis. Electronically Signed   By: Marlise Simpers M.D.   On: 08/29/2023 07:34   DG Chest Portable 1 View Result Date: 08/29/2023 CLINICAL DATA:  48 year old male with vomiting and abdominal pain. EXAM: PORTABLE CHEST 1 VIEW COMPARISON:  Chest radiographs 01/11/2020 and earlier. FINDINGS: Portable AP semi upright view at 0711 hours. Lordotic positioning. Mediastinal contours remain within normal limits. Lower lung volumes. Allowing for portable technique the lungs are clear. No pneumothorax or pleural effusion. Paucity bowel gas in the visible abdomen. Stable visualized osseous structures. IMPRESSION: Negative portable chest. Electronically Signed   By: Arline Laity.D.  On: 08/29/2023 07:32    Microbiology: Results for orders placed or performed during the hospital encounter of 08/29/23  Culture, blood (Routine X 2) w Reflex to ID Panel     Status: None   Collection Time: 08/29/23 11:02 AM   Specimen: BLOOD  Result Value Ref Range Status   Specimen Description   Final    BLOOD RIGHT ANTECUBITAL Performed at [facility], [address] [place], [place] [address]    Special Requests   Final    BOTTLES DRAWN AEROBIC AND ANAEROBIC Blood Culture results may not be optimal due to an inadequate volume of blood received in culture bottles Performed at [facility], [address] [place], [place] [address]    Culture   Final    NO GROWTH 5 DAYS Performed at [facility], [address] [place], [place] [address]    Report Status 09/03/2023 FINAL  Final  MRSA  Next Gen by PCR, Nasal     Status: None   Collection Time: 08/29/23 12:13 PM   Specimen: Nasal Mucosa; Nasal Swab  Result Value Ref Range Status   MRSA by PCR Next Gen NOT DETECTED NOT DETECTED Final    Comment: (NOTE) The GeneXpert MRSA Assay (FDA approved for NASAL specimens only), is one component of a comprehensive MRSA colonization surveillance program. It is not intended to diagnose MRSA infection nor to guide or monitor treatment for MRSA infections. Test performance is not FDA approved in patients less than 85 years old. Performed at [facility], [address], [place], [place] [address]   Culture, blood (Routine X 2) w Reflex to ID Panel     Status: None   Collection Time: 08/29/23  1:02 PM   Specimen: BLOOD  Result Value Ref Range Status   Specimen Description BLOOD SITE NOT SPECIFIED  Final   Special Requests   Final    BOTTLES DRAWN AEROBIC ONLY Blood Culture results may not be optimal due to an inadequate volume of blood received in culture bottles   Culture   Final    NO GROWTH 5 DAYS Performed at [facility], [address] [place], [place] [address]    Report Status 09/03/2023 FINAL  Final  Respiratory (~20 pathogens) panel by PCR     Status: None   Collection Time: 08/29/23  3:15 PM   Specimen: Nasopharyngeal Swab; Respiratory  Result Value Ref Range Status   Adenovirus NOT DETECTED NOT DETECTED Final   Coronavirus 229E NOT DETECTED NOT DETECTED Final    Comment: (NOTE) The Coronavirus on the Respiratory Panel, DOES NOT test for the novel  Coronavirus (2019 nCoV)    Coronavirus HKU1 NOT DETECTED NOT DETECTED Final   Coronavirus NL63 NOT DETECTED NOT DETECTED Final   Coronavirus OC43 NOT DETECTED NOT DETECTED Final   Metapneumovirus NOT DETECTED NOT DETECTED Final   Rhinovirus / Enterovirus NOT DETECTED NOT DETECTED Final   Influenza A NOT DETECTED NOT DETECTED Final   Influenza B NOT DETECTED NOT DETECTED Final   Parainfluenza  Virus 1 NOT DETECTED NOT DETECTED Final   Parainfluenza Virus 2 NOT DETECTED NOT DETECTED Final   Parainfluenza Virus 3 NOT DETECTED NOT DETECTED Final   Parainfluenza Virus 4 NOT DETECTED NOT DETECTED Final   Respiratory Syncytial Virus NOT DETECTED NOT DETECTED Final   Bordetella pertussis NOT DETECTED NOT DETECTED Final   Bordetella Parapertussis NOT DETECTED NOT DETECTED Final   Chlamydophila pneumoniae NOT DETECTED NOT  DETECTED Final   Mycoplasma pneumoniae NOT DETECTED NOT DETECTED Final    Comment: Performed at [facility], [address] [place], [place] [address]    Labs: CBC: Recent Labs  Lab 08/29/23 0542 08/29/23 1448 08/30/23 0318 08/31/23 0325 09/01/23 0331 09/02/23 0341 09/03/23 0523  WBC 21.1* 23.4* 19.7* 14.8* 11.5* 8.6 7.6  NEUTROABS 18.0* 20.4*  --   --   --   --   --   HGB 12.7* 12.3* 12.3* 12.5* 11.4* 10.3* 10.4*  HCT 41.7 40.4 39.8 40.7 36.8* 33.8* 33.5*  MCV 63.7* 63.5* 64.7* 63.3* 62.7* 63.5* 63.6*  PLT 221 235 220 196 179 158 174   Basic Metabolic Panel: Recent Labs  Lab 08/30/23 0318 08/31/23 0325 09/01/23 0331 09/02/23 0341 09/03/23 0523  NA 134* 141 135 137 137  K 3.5 3.6 3.2* 3.9 4.1  CL 100 103 99 103 103  CO2 24 29 26 28 27   GLUCOSE 181* 117* 100* 176* 184*  BUN 10 7 8 10 9   CREATININE 0.78 0.59* 0.86 0.81 0.82  CALCIUM  8.3* 8.9 8.3* 8.6* 8.5*  MG  --  2.0  --   --   --   PHOS  --  3.5  --  3.0 2.9   Liver Function Tests: Recent Labs  Lab 08/29/23 0542 08/29/23 1448 08/30/23 0318 08/31/23 0325 09/02/23 0341 09/03/23 0523  AST 26 30 22 24   --   --   ALT 25 23 18 18   --   --   ALKPHOS 66 63 58 56  --   --   BILITOT 0.7 0.9 0.7 0.8  --   --   PROT 7.5 7.2 6.2* 6.8  --   --   ALBUMIN 4.3 4.0 3.6 3.7 3.1* 3.2*   CBG: Recent Labs  Lab 09/02/23 0746 09/02/23 1200 09/02/23 1656 09/02/23 2027 09/03/23 0738  GLUCAP 166* 243* 187* 169* 178*    Discharge time spent: greater than 30 minutes.  Signed: Bertram Brocks, MD [facility] 09/03/2023

## 2023-09-04 ENCOUNTER — Other Ambulatory Visit (HOSPITAL_COMMUNITY): Payer: Self-pay

## 2023-09-06 ENCOUNTER — Telehealth: Payer: Self-pay | Admitting: Nurse Practitioner

## 2023-09-06 ENCOUNTER — Other Ambulatory Visit (HOSPITAL_COMMUNITY): Payer: Self-pay

## 2023-09-06 NOTE — Telephone Encounter (Signed)
 Copied from CRM [id number]. Topic: General - Other >> Sep 06, 2023  9:03 AM Alpha [employer] wrote: Reason for CRM: Patient would like to know if social security paperwork has been faxed.  Callback #: [phone]

## 2023-09-06 NOTE — Telephone Encounter (Signed)
 Copied from CRM [id number]. Topic: General - Call Back - No Documentation >> Sep 06, 2023  9:11 AM Alpha Arts wrote: Reason for CRM: Patient would like a callback from Jerrlyn Morel, NP to discuss status of social security paperwork.  Callback #: [id number]

## 2023-09-07 ENCOUNTER — Telehealth: Payer: Self-pay | Admitting: Nurse Practitioner

## 2023-09-07 ENCOUNTER — Other Ambulatory Visit (HOSPITAL_COMMUNITY): Payer: Self-pay

## 2023-09-07 ENCOUNTER — Other Ambulatory Visit: Payer: Self-pay

## 2023-09-07 NOTE — Telephone Encounter (Signed)
 Pt returned call for update, is requesting a call back today  Best contact: [phone]

## 2023-09-08 ENCOUNTER — Other Ambulatory Visit: Payer: Self-pay

## 2023-09-13 ENCOUNTER — Inpatient Hospital Stay: Payer: Self-pay | Admitting: Nurse Practitioner

## 2023-09-15 ENCOUNTER — Telehealth: Payer: Self-pay

## 2023-09-15 ENCOUNTER — Other Ambulatory Visit: Payer: Self-pay | Admitting: Vascular Surgery

## 2023-09-15 ENCOUNTER — Other Ambulatory Visit: Payer: Self-pay | Admitting: Nurse Practitioner

## 2023-09-15 ENCOUNTER — Other Ambulatory Visit: Payer: Self-pay

## 2023-09-15 NOTE — Telephone Encounter (Signed)
 Pt overdue for f/u. I have called him and left VM for him to return our call.

## 2023-09-15 NOTE — Telephone Encounter (Signed)
 Attempted to reach pt regarding his Pletal  refill request sent to us  from his pharmacy. He has not been here in over a year and is overdue for f/u. I left him a VM to call us  back, so that we can schedule his f/u.

## 2023-09-17 ENCOUNTER — Other Ambulatory Visit (HOSPITAL_COMMUNITY): Payer: Self-pay

## 2023-09-27 ENCOUNTER — Inpatient Hospital Stay: Payer: Self-pay | Admitting: Nurse Practitioner

## 2023-09-30 ENCOUNTER — Other Ambulatory Visit: Payer: Self-pay

## 2023-10-01 ENCOUNTER — Other Ambulatory Visit: Payer: Self-pay

## 2023-10-06 DIAGNOSIS — E119 Type 2 diabetes mellitus without complications: Secondary | ICD-10-CM | POA: Diagnosis not present

## 2023-10-11 DIAGNOSIS — E119 Type 2 diabetes mellitus without complications: Secondary | ICD-10-CM | POA: Diagnosis not present

## 2023-10-11 DIAGNOSIS — R3981 Functional urinary incontinence: Secondary | ICD-10-CM | POA: Diagnosis not present

## 2023-10-17 ENCOUNTER — Telehealth: Payer: Self-pay | Admitting: Nurse Practitioner

## 2023-10-17 NOTE — Telephone Encounter (Signed)
 Patient was identified as falling into the [place] Measure - Diabetes.   Patient was: Appointment scheduled with primary care provider in the next 30 days.

## 2023-10-19 ENCOUNTER — Other Ambulatory Visit: Payer: Self-pay

## 2023-10-20 ENCOUNTER — Encounter: Payer: Self-pay | Admitting: Nurse Practitioner

## 2023-10-20 ENCOUNTER — Ambulatory Visit: Admitting: Nurse Practitioner

## 2023-10-20 VITALS — BP 123/80 | HR 68 | Temp 98.2°F | Wt 179.6 lb

## 2023-10-20 DIAGNOSIS — Z1211 Encounter for screening for malignant neoplasm of colon: Secondary | ICD-10-CM | POA: Diagnosis not present

## 2023-10-20 DIAGNOSIS — M79671 Pain in right foot: Secondary | ICD-10-CM

## 2023-10-20 DIAGNOSIS — I6350 Cerebral infarction due to unspecified occlusion or stenosis of unspecified cerebral artery: Secondary | ICD-10-CM | POA: Diagnosis not present

## 2023-10-20 DIAGNOSIS — E1159 Type 2 diabetes mellitus with other circulatory complications: Secondary | ICD-10-CM | POA: Diagnosis not present

## 2023-10-20 DIAGNOSIS — M79672 Pain in left foot: Secondary | ICD-10-CM | POA: Diagnosis not present

## 2023-10-20 DIAGNOSIS — Z794 Long term (current) use of insulin: Secondary | ICD-10-CM

## 2023-10-20 DIAGNOSIS — F32A Depression, unspecified: Secondary | ICD-10-CM | POA: Diagnosis not present

## 2023-10-20 DIAGNOSIS — F419 Anxiety disorder, unspecified: Secondary | ICD-10-CM

## 2023-10-20 NOTE — Progress Notes (Signed)
 Subjective   Patient ID: Frank Schneider, male    DOB: [birth year], 48 y.o.   MRN: [id number]  Chief Complaint  Patient presents with   Medical Management of Chronic Issues    Patient stated that his left hand is in pain.    Referring provider: [name], NP  Frank Schneider is a 48 y.o. male with Past Medical History: No date: Anticoagulated     Comment:  ASA 325 mg--- ;managed by pcp  (stroke prevention) No date: Beta thalassemia trait     Comment:  hematology/ oncologist--- dr lanny (arnetta in epic               11-11-2022  elevated A2 No date: Chronic foot pain No date: Diabetic neuropathy (HCC) No date: DOE (dyspnea on exertion)     Comment:  02-23-2023  per pt sob w/ stairs , yard work, and ok               with household chores,  does not do any time of walking               or exercise 12/31/2014: History of cerebrovascular accident (CVA) with residual  deficit     Comment:  admission in epic;   right thalamic infarct second SVD               residual LUE / LLE numb/ ting with sensory deficit No date: History of eye trauma     Comment:  02-23-2023  per pt eye injury age late 29s , never seek               medical attention,  vision became progressive worse over               time;  then pt had stroke 01/ 2020 residual w/ 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,  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/ 2024 05/09/2015: History of ischemic stroke     Comment:  admission in epic;   acute ischemia right ventral pons,               pt was taking his plavix  06/11/2022: History of removal of eye     Comment:  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 and post stroke residual ,  (02-23-2023              per pt does have prosthesis yet) 04/29/2018: History of stroke with residual deficit     Comment:  admission in epic;  acute ischemia left anterolateral               thlamamic/ posterior limb  internal capsule-- residual               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 gretta;  taking pletal   twice daily No date: Leucocytosis No date: Numbness and tingling of left arm and leg     Comment:  stroke residual,  sensory deficit resolve No date: Type 2 diabetes mellitus (HCC)     Comment:  followed by pcp;   (02-23-2023  per pt has Libre 3,                blood sugar check multiple times daily,  stated average               fasting sugar 120s,  stopped doing lantus  daily approx               09/ 2024,  only taking jardiance  daily and weely ozempic ) No date: Vision abnormalities 12/2019: Vitamin D  deficiency No date: Wears glasses  Hospital Admission: 08/29/23  Hospital SUMMARY:  SIRS. Possible gastritis, gastroenteritis versus gastroparesis - Possible gastroparesis due to Ozempic , not on metformin  - Continue full liquid diet, thickened liquids  - Completed IV cefepime , Flagyl .   - Patient underwent SLP evaluations by speech therapy, recommended for liquid diet - Please is tolerating full liquid diet.  Discussed with GI Dr. Rosalie, recommended to hold off Ozempic , stay on full liquid diet if patient can tolerate for a couple of weeks.  Plan on outpatient EGD if no significant improvement.      Uncontrolled hypertension - Patient was placed on Cleviprex  drip in ICU for BP control.   - Continue lisinopril , amlodipine , Coreg   - 2D echo showed EF of 70 to 75%, no regional WMA, normal diastolic parameters   Lactic acidosis -Initial lactic acid 4.0, trended up to 6.5, received IV fluid hydration - Now resolved   Hypokalemia - Replaced   Diabetes mellitus, uncontrolled with hyperglycemia - Hemoglobin A1c 8.9,  was on Jardiance , Ozempic , Lantus  outpatient, noncompliant, not on metformin  - Diabetic coordinator consult was obtained - Given symptoms could be from Ozempic , it was placed on hold -Continue Lantus  Solostar pen 12 units daily, NovoLog  meal coverage 4 units 3 times daily AC.  Stopped Jardiance      Intermittent claudication, PAD - Continue Pletal      History of right MCA CVA -Continue aspirin , statin     Chronic leukocytosis - Resolved   Cocaine use UDS positive for cocaine, patient counseled on cessation   HPI  Patient presents today for hospital follow-up.  He was recently in the hospital with gastritis.  This was believed to be caused by Ozempic .  He was told to hold Ozempic  for now.  We will place a consult for pharmacy for medication management. Patient has been stable since hospital follow up. Denies f/c/s, n/v/d, hemoptysis, PND, leg swelling Denies chest pain or edema   No Known Allergies  Immunization History  Administered Date(s) Administered   Influenza,inj,Quad PF,6+ Mos 01/15/2015   PFIZER(Purple Top)SARS-COV-2 Vaccination 01/03/2020   Pneumococcal Polysaccharide-23 07/29/2011   Tdap 01/15/2015    Tobacco History: Social History   Tobacco Use  Smoking Status Every Day   Types: Cigarettes  Smokeless Tobacco Never  Tobacco Comments   02-23-2023  per pt currently 5 cig per day,  started age 33 (70),  smoked for 56yrs   Ready to quit: Not Answered Counseling given: Yes Tobacco comments: 02-23-2023  per pt currently 5 cig per day,  started age 38 (1),  smoked for 46yrs   Outpatient Encounter Medications as of 10/20/2023  Medication Sig   Accu-Chek Softclix Lancets lancets Use in the morning and at bedtime   acetaminophen  (TYLENOL ) 500  MG tablet Take 2 tablets (Total= 1,000 mg), by mouth, 2 times a day as needed.   amLODipine  (NORVASC ) 10 MG tablet Take 1 tablet (10 mg total) by mouth daily.   aspirin  325 MG tablet Take 1 tablet (325 mg total) by  mouth daily.   atorvastatin  (LIPITOR) 40 MG tablet Take 1 tablet (40 mg total) by mouth daily.   benzonatate  (TESSALON ) 200 MG capsule Take 1 capsule (200 mg total) by mouth 3 (three) times daily as needed for cough.   Blood Glucose Monitoring Suppl (ACCU-CHEK GUIDE) w/Device KIT Use to test blood sugar 3 times daily   carvedilol  (COREG ) 12.5 MG tablet Take 1 tablet (12.5 mg total) by mouth 2 (two) times daily with a meal.   cilostazol  (PLETAL ) 100 MG tablet Take 1 tablet (100 mg total) by mouth 2 (two) times daily before a meal.   Continuous Glucose Sensor (FREESTYLE LIBRE 3 SENSOR) MISC Place 1 sensor on the skin every 14 days. Use to check glucose continuously   dicyclomine  (BENTYL ) 20 MG tablet Take 1 tablet (20 mg total) by mouth 2 (two) times daily as needed for spasms.   food thickener (SIMPLYTHICK, HONEY/LEVEL 3/MODERATELY THICK,) LIQD Take 10 packets by mouth as needed.   Glucose Blood (BLOOD GLUCOSE TEST STRIPS) STRP Use 3 (three) times daily as directed to check blood sugar.   guaiFENesin -dextromethorphan  (ROBITUSSIN DM) 100-10 MG/5ML syrup Take 5 mLs by mouth every 4 (four) hours as needed for cough.   insulin  aspart (NOVOLOG ) 100 UNIT/ML FlexPen Inject 4 Units into the skin 3 (three) times daily with meals. Only take if eating a meal AND Blood Glucose (BG) is 80 or higher.   insulin  glargine (LANTUS  SOLOSTAR) 100 UNIT/ML Solostar Pen Inject 12 Units into the skin at bedtime.   Insulin  Pen Needle (PEN NEEDLES) 31G X 5 MM MISC Use 3 times daily as directed   Lancets Misc. (ACCU-CHEK SOFTCLIX LANCET DEV) KIT Use 3 times daily   Lancets MISC Use 3 (three) times daily as directed to check blood sugar.   lisinopril  (ZESTRIL ) 40 MG tablet Take 1 tablet (40 mg total) by mouth daily.   loperamide  (IMODIUM  A-D) 2 MG tablet Take 1 tablet (2 mg total) by mouth 4 (four) times daily as needed for diarrhea or loose stools.   ondansetron  (ZOFRAN -ODT) 4 MG disintegrating tablet Take 1 tablet (4 mg  total) by mouth every 8 (eight) hours as needed for nausea or vomiting.   pantoprazole  (PROTONIX ) 40 MG tablet Take 1 tablet (40 mg total) by mouth daily at 12 noon.   [Paused] Semaglutide ,0.25 or 0.5MG /DOS, (OZEMPIC , 0.25 OR 0.5 MG/DOSE,) 2 MG/3ML SOPN Inject 0.5 mg into the skin once a week. Ok to fill early   [DISCONTINUED] Blood Glucose Monitoring Suppl (ACCU-CHEK GUIDE) w/Device KIT Use in the morning, at noon, in the evening, and at bedtime. (Patient not taking: Reported on 06/22/2022)   [DISCONTINUED] glucose blood (TRUE METRIX BLOOD GLUCOSE TEST) test strip Use as instructed   [DISCONTINUED] Lancets (FREESTYLE) lancets Use as instructed (Patient not taking: Reported on 06/22/2022)   No facility-administered encounter medications on file as of 10/20/2023.    Review of Systems  Review of Systems  Constitutional: Negative.   HENT: Negative.    Cardiovascular: Negative.   Gastrointestinal: Negative.   Allergic/Immunologic: Negative.   Neurological: Negative.   Psychiatric/Behavioral: Negative.       Objective:   BP 123/80   Pulse 68   Temp 98.2 F (36.8 C) (Oral)  Wt 179 lb 9.6 oz (81.5 kg)   SpO2 98%   BMI 25.77 kg/m   Wt Readings from Last 5 Encounters:  10/20/23 179 lb 9.6 oz (81.5 kg)  08/29/23 160 lb (72.6 kg)  02/22/23 176 lb (79.8 kg)  01/07/23 178 lb 3.2 oz (80.8 kg)  11/11/22 179 lb 9.6 oz (81.5 kg)     Physical Exam Vitals and nursing note reviewed.  Constitutional:      General: He is not in acute distress.    Appearance: He is well-developed.   Cardiovascular:     Rate and Rhythm: Normal rate and regular rhythm.  Pulmonary:     Effort: Pulmonary effort is normal.     Breath sounds: Normal breath sounds.   Skin:    General: Skin is warm and dry.   Neurological:     Mental Status: He is alert and oriented to person, place, and time.       Assessment & Plan:   Screening for colon cancer -     Cologuard -     Ambulatory referral to Home  Health  Type 2 diabetes mellitus with other circulatory complication, with long-term current use of insulin  (HCC) -     Microalbumin / creatinine urine ratio -     AMB Referral VBCI Care Management -     CBC -     Comprehensive metabolic panel with GFR -     Ambulatory referral to Home Health -     Ambulatory referral to Podiatry  Cerebrovascular accident (CVA) due to occlusion of cerebral artery (HCC) -     Ambulatory referral to Home Health  Bilateral foot pain -     Ambulatory referral to Podiatry     Return in about 3 months (around 01/20/2024).   Bascom GORMAN Borer, NP 10/20/2023

## 2023-10-21 ENCOUNTER — Ambulatory Visit: Payer: Self-pay | Admitting: Nurse Practitioner

## 2023-10-21 ENCOUNTER — Telehealth: Payer: Self-pay | Admitting: *Deleted

## 2023-10-21 LAB — CBC
Hematocrit: 42.5 % (ref 37.5–51.0)
Hemoglobin: 12.5 g/dL — ABNORMAL LOW (ref 13.0–17.7)
MCH: 19.5 pg — ABNORMAL LOW (ref 26.6–33.0)
MCHC: 29.4 g/dL — ABNORMAL LOW (ref 31.5–35.7)
MCV: 66 fL — ABNORMAL LOW (ref 79–97)
Platelets: 199 10*3/uL (ref 150–450)
RBC: 6.41 x10E6/uL — ABNORMAL HIGH (ref 4.14–5.80)
RDW: 18.8 % — ABNORMAL HIGH (ref 11.6–15.4)
WBC: 9.8 10*3/uL (ref 3.4–10.8)

## 2023-10-21 LAB — COMPREHENSIVE METABOLIC PANEL WITH GFR
ALT: 12 IU/L (ref 0–44)
AST: 18 IU/L (ref 0–40)
Albumin: 4.5 g/dL (ref 4.1–5.1)
Alkaline Phosphatase: 73 IU/L (ref 44–121)
BUN/Creatinine Ratio: 12 (ref 9–20)
BUN: 11 mg/dL (ref 6–24)
Bilirubin Total: 0.5 mg/dL (ref 0.0–1.2)
CO2: 22 mmol/L (ref 20–29)
Calcium: 9.9 mg/dL (ref 8.7–10.2)
Chloride: 104 mmol/L (ref 96–106)
Creatinine, Ser: 0.95 mg/dL (ref 0.76–1.27)
Globulin, Total: 2.3 g/dL (ref 1.5–4.5)
Glucose: 42 mg/dL — ABNORMAL LOW (ref 70–99)
Potassium: 4.1 mmol/L (ref 3.5–5.2)
Sodium: 142 mmol/L (ref 134–144)
Total Protein: 6.8 g/dL (ref 6.0–8.5)
eGFR: 99 mL/min/{1.73_m2} (ref >59)

## 2023-10-21 NOTE — Progress Notes (Signed)
 Complex Care Management Note  Care Guide Note 10/21/2023 Name: Frank Schneider MRN: [id number] DOB: [birth year]  Frank Schneider is a 48 y.o. year old male who sees [name], NP for primary care. I reached out to [employer] by phone today to offer complex care management services.  [name] was given information about Complex Care Management services today including:   The Complex Care Management services include support from the care team which includes your Nurse Care Manager, Clinical Social Worker, or Pharmacist.  The Complex Care Management team is here to help remove barriers to the health concerns and goals most important to you. Complex Care Management services are voluntary, and the patient may decline or stop services at any time by request to their care team member.   Complex Care Management Consent Status: Patient agreed to services and verbal consent obtained.   Follow up plan:  Telephone appointment with complex care management team member scheduled for:  11/03/23  Encounter Outcome:  Patient Scheduled  Harlene Satterfield  [facility]  [facility], [facility] Guide  Direct Dial: [phone]  Fax [phone]

## 2023-10-21 NOTE — Progress Notes (Signed)
 Care Guide Pharmacy Note  10/21/2023 Name: Frank Schneider MRN: [id number] DOB: 1976-01-09  Referred By: [name], NP Reason for referral: Complex Care Management (Initial outreach to schedule referral with PharmD POD 6)   [name] is a 48 y.o. year old male who is a primary care patient of [name], NP.  Kimarion Monjaraz was referred to the pharmacist for assistance related to: DMII  Successful contact was made with the patient to discuss pharmacy services including being ready for the pharmacist to call at least 5 minutes before the scheduled appointment time and to have medication bottles and any blood pressure readings ready for review. The patient agreed to meet with the pharmacist via telephone visit on (date/time). 12/01/23 at 2:30 PM.   Harlene Satterfield  [facility]  [facility], [facility] Guide  Direct Dial: [phone]  Fax [phone]

## 2023-10-22 ENCOUNTER — Other Ambulatory Visit: Payer: Self-pay

## 2023-10-25 ENCOUNTER — Ambulatory Visit: Admitting: Podiatry

## 2023-10-25 ENCOUNTER — Other Ambulatory Visit: Payer: Self-pay

## 2023-10-25 ENCOUNTER — Other Ambulatory Visit: Payer: Self-pay | Admitting: Nurse Practitioner

## 2023-10-25 ENCOUNTER — Other Ambulatory Visit (HOSPITAL_COMMUNITY): Payer: Self-pay

## 2023-10-25 ENCOUNTER — Telehealth: Payer: Self-pay | Admitting: Pharmacist

## 2023-10-25 DIAGNOSIS — I10 Essential (primary) hypertension: Secondary | ICD-10-CM

## 2023-10-25 MED ORDER — AMLODIPINE BESYLATE 10 MG PO TABS
10.0000 mg | ORAL_TABLET | Freq: Every day | ORAL | 1 refills | Status: DC
  Filled 2023-11-02: qty 30, 30d supply, fill #0
  Filled 2023-11-30 – 2023-12-02 (×2): qty 30, 30d supply, fill #1
  Filled 2023-12-30 – 2024-01-11 (×3): qty 30, 30d supply, fill #2
  Filled 2024-02-03: qty 30, 30d supply, fill #3
  Filled 2024-03-06: qty 30, 30d supply, fill #4
  Filled 2024-04-04: qty 30, 30d supply, fill #5

## 2023-10-25 NOTE — Progress Notes (Unsigned)
 Contacted patient regarding referral for diabetes from [name], NP . Patient is scheduled to talk to the pharmacist on 7/9, but due to recent medication changes while hospitalized, I wanted to connect sooner. Glucose on last BMP was 42.    Left patient a voicemail to return my call at their convenience  Catie [facility], PharmD, [facility] Clinical Pharmacist [phone]

## 2023-11-01 ENCOUNTER — Telehealth: Payer: Self-pay | Admitting: Nurse Practitioner

## 2023-11-01 ENCOUNTER — Telehealth: Payer: Self-pay

## 2023-11-01 NOTE — Telephone Encounter (Signed)
 Copied from CRM [id number]. Topic: Referral - Question >> Nov 01, 2023  3:14 PM Charlet HERO wrote: Reason for CRM: Frank Schneider is calling from [facility] [employer] about the referral that was sent on 06/27 @11 :59 am needs to be filled out with the last patient visit information and faxed back to [phone].

## 2023-11-01 NOTE — Telephone Encounter (Signed)
 Copied from CRM [id number]. Topic: Clinical - Lab/Test Results >> Nov 01, 2023  9:23 AM Elle L wrote: Reason for CRM: The patient returned a call regarding his lab results. I read the note verbatim and he expressed understanding.

## 2023-11-01 NOTE — Telephone Encounter (Signed)
 Copied from CRM [id number]. Topic: General - Transportation >> Nov 01, 2023  9:42 AM Thersia BROCKS wrote: Reason for CRM: Patient called in stated he called [facility] regarding transportation, patient stated they do not offer transportation, do not what he is suppose to do, would like a nurse to give him a callback regarding this

## 2023-11-02 ENCOUNTER — Other Ambulatory Visit: Payer: Self-pay

## 2023-11-03 ENCOUNTER — Other Ambulatory Visit: Payer: Self-pay

## 2023-11-03 ENCOUNTER — Telehealth: Payer: Self-pay

## 2023-11-03 ENCOUNTER — Other Ambulatory Visit: Payer: Self-pay | Admitting: Licensed Clinical Social Worker

## 2023-11-03 NOTE — Telephone Encounter (Unsigned)
 Copied from CRM [id number]. Topic: General - Other >> Nov 03, 2023 11:36 AM Travis F wrote: Reason for CRM: Patient is calling in because he spoke with a counselor and was told to contact his provider to see what he needs to do to get a motorized scooter due to his mobility being limited. Patient says he has trouble walking distances.

## 2023-11-03 NOTE — Patient Outreach (Signed)
 Complex Care Management   Visit Note  11/03/2023  Name:  Frank Schneider MRN: [id number] DOB: [birth year]  Situation: Referral received for Complex Care Management related to housing needs, food scarcity issues I obtained verbal consent from Patient.  Visit completed with patient   on the phone  Background:   Past Medical History:  Diagnosis Date   Anticoagulated    ASA 325 mg--- ;managed by pcp  (stroke prevention)   Beta thalassemia trait    hematology/ oncologist--- dr lanny (lov in epic 11-11-2022  elevated A2   Chronic foot pain    Diabetic neuropathy (HCC)    DOE (dyspnea on exertion)    02-23-2023  per pt sob w/ stairs , yard work, and ok with household chores,  does not do any time of walking or exercise   History of cerebrovascular accident (CVA) with residual deficit 12/31/2014   admission in epic;   right thalamic infarct second SVD residual LUE / LLE numb/ ting with sensory deficit   History of eye trauma    02-23-2023  per pt eye injury age late 34s , never seek medical attention,  vision became progressive worse over time;  then pt had stroke 01/ 2020 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,  s/p eye removal 02/ 2024   History of ischemic stroke 05/09/2015   admission in epic;   acute ischemia right ventral pons,  pt was taking his plavix    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 gretta;   taking pletal  twice daily   Leucocytosis    Numbness and tingling of left arm and leg    stroke residual,  sensory deficit resolve   Type 2 diabetes mellitus (HCC)    followed by pcp;   (02-23-2023  per pt has Libre 3,  blood sugar check multiple times daily,  stated average fasting sugar 120s,  stopped doing lantus  daily approx 09/ 2024,  only taking jardiance  daily and weely ozempic )   Vision abnormalities    Vitamin D  deficiency 12/2019   Wears glasses     Assessment: Patient Reported Symptoms:  Cognitive Cognitive Status: Alert and oriented to person, place, and time Cognitive/Intellectual Conditions Management [RPT]: None reported or documented in medical history or problem list   Health Maintenance Behaviors: Stress management Health Facilitated by: Rest  Neurological Neurological Review of Symptoms: Weakness, Dizziness, Headaches (difficulty walking.) Neurological Management Strategies: Coping strategies  HEENT HEENT Symptoms Reported: No symptoms reported HEENT Management Strategies: Coping strategies    Cardiovascular Cardiovascular Symptoms Reported: Dizziness, Fatigue Does patient have uncontrolled Hypertension?: No Cardiovascular Management Strategies: Coping strategies  Respiratory Respiratory Symptoms Reported: Wheezing, Shortness of breath Additional Respiratory Details: concerned about mold issues in home Respiratory Management Strategies: Coping strategies  Endocrine Endocrine Symptoms Reported: Weakness or fatigue, Blurry vision, Increased urination    Gastrointestinal Gastrointestinal Symptoms Reported: Incontinence Additional Gastrointestinal Details: use pull up briefs occasionally Gastrointestinal  Management Strategies: Coping strategies    Genitourinary Genitourinary Symptoms Reported: Frequency Genitourinary Management Strategies: Coping strategies  Integumentary Integumentary Symptoms Reported: No symptoms reported Skin Management Strategies: Coping  strategies  Musculoskeletal Musculoskelatal Symptoms Reviewed: Unsteady gait, Muscle pain, Weakness, Difficulty walking Musculoskeletal Management Strategies: Coping strategies, Adequate rest Falls in the past year?: Yes (had a fall in the shower) Was there an injury with Fall?: No    Psychosocial Psychosocial Symptoms Reported: Anxiety - if selected complete GAD, Sadness - if selected complete PHQ 2-9, Depression - if selected complete PHQ 2-9 Additional Psychological Details: history of stroke; weakness; unsteady gait Behavioral Management Strategies: Coping strategies, Adequate rest, Counseling Major Change/Loss/Stressor/Fears (CP): Medical condition, self Techniques to Cope with Loss/Stress/Change: Counseling Quality of Family Relationships: supportive Do you feel physically threatened by others?: No      11/03/2023   11:06 AM  Depression screen PHQ 2/9  Decreased Interest 1  Down, Depressed, Hopeless 1  PHQ - 2 Score 2  Altered sleeping 1  Tired, decreased energy 1  Change in appetite 1  Feeling bad or failure about yourself  1  Trouble concentrating 1  Moving slowly or fidgety/restless 1  Suicidal thoughts 0  PHQ-9 Score 8  Difficult doing work/chores Somewhat difficult    Vitals:   BP in normal range, per client  Medications Reviewed Today     Reviewed by Frances Ozell GORMAN KEN (Social Worker) on 11/03/23 at 1047  Med List Status: <None>   Medication Order Taking? Sig Documenting Provider Last Dose Status Informant  Accu-Chek Softclix Lancets lancets [id number] Yes Use in the morning and at bedtime Nichols, Tonya S, NP  Active Self, Pharmacy Records  acetaminophen  (TYLENOL ) 500 MG tablet [id number] Yes Take 2 tablets (Total= 1,000 mg), by mouth, 2 times a day as needed. Jegede, Olugbemiga E, MD  Active Self, Pharmacy Records  amLODipine  (NORVASC ) 10 MG tablet [id number] Yes Take 1 tablet (10 mg total) by mouth daily. Oley Bascom GORMAN, NP  Active   aspirin  325 MG tablet  [id number] Yes Take 1 tablet (325 mg total) by mouth daily. Oley Bascom GORMAN, NP  Active Self, Pharmacy Records  atorvastatin  (LIPITOR) 40 MG tablet [id number] Yes Take 1 tablet (40 mg total) by mouth daily. Oley Bascom GORMAN, NP  Active Self, Pharmacy Records  benzonatate  (TESSALON ) 200 MG capsule [id number] Unknown  Take 1 capsule (200 mg total) by mouth 3 (three) times daily as needed for cough.  Patient not taking: Reported on 11/03/2023   Davia Nydia POUR, MD  Active    Patient not taking:   Discontinued 06/22/22 1418 Blood Glucose Monitoring Suppl (ACCU-CHEK GUIDE) w/Device KIT [id number] taking Use to test blood sugar 3 times daily  Patient not taking: Reported on 11/03/2023   Davia Nydia POUR, MD  Active   carvedilol  (COREG ) 12.5 MG tablet [id number] Yes Take 1 tablet (12.5 mg total) by mouth 2 (two) times daily with a meal. Rai, Ripudeep K, MD  Active   cilostazol  (PLETAL ) 100 MG tablet [id number] unknown Take 1 tablet (100 mg total) by mouth 2 (two) times daily before a meal.  Patient not taking: Reported on 11/03/2023   Gretta Lonni PARAS, MD  Active Self, Pharmacy Records  Continuous Glucose Sensor (FREESTYLE LIBRE 3 [place]) [place] [id number] Yes Place 1 sensor on the skin every 14 days. Use to check glucose continuously Paseda, Folashade R, FNP  Active Self, Pharmacy Records  dicyclomine  (BENTYL ) 20 MG tablet [id number] Unknown  Take 1 tablet (20 mg total) by  mouth 2 (two) times daily as needed for spasms. Small, Lyle CROME, PA  Active Self, Pharmacy Records  food thickener (SIMPLYTHICK, HONEY/LEVEL ADONNA HONG,) LIQD [id number] Yes Take 10 packets by mouth as needed. Rai, Nydia POUR, MD  Active   Glucose Blood (BLOOD GLUCOSE TEST STRIPS) STRP [id number] Yes Use 3 (three) times daily as directed to check blood sugar. Davia Nydia POUR, MD  Active     Discontinued 06/22/22 1418 guaiFENesin -dextromethorphan  (ROBITUSSIN DM) 100-10 MG/5ML syrup [id number] Taking  Take 5 mLs by mouth every 4 (four) hours as  needed for cough.  Patient not taking: Reported on 11/03/2023   Davia Nydia POUR, MD  Active   insulin  aspart (NOVOLOG ) 100 UNIT/ML FlexPen [id number] Yes Inject 4 Units into the skin 3 (three) times daily with meals. Only take if eating a meal AND Blood Glucose (BG) is 80 or higher. Rai, Nydia POUR, MD  Active   insulin  glargine (LANTUS  SOLOSTAR) 100 UNIT/ML Solostar Pen [id number] Yes Inject 12 Units into the skin at bedtime. Rai, Ripudeep POUR, MD  Active   Insulin  Pen Needle (PEN NEEDLES) 31G X 5 MM MISC [id number] Yes Use 3 times daily as directed Rai, Nydia POUR, MD  Active    Patient not taking:   Discontinued 06/22/22 1418 Lancets MISC [id number] Yes Use 3 (three) times daily as directed to check blood sugar. Rai, Nydia POUR, MD  Active   Lancets Misc. (ACCU-CHEK SOFTCLIX LANCET DEV) KIT [id number] Yes Use 3 times daily Rai, Ripudeep K, MD  Active   lisinopril  (ZESTRIL ) 40 MG tablet [id number] Yes Take 1 tablet (40 mg total) by mouth daily. Rai, Nydia POUR, MD  Active   loperamide  (IMODIUM  A-D) 2 MG tablet [id number] unknown Take 1 tablet (2 mg total) by mouth 4 (four) times daily as needed for diarrhea or loose stools. [name], NP  Active Self, Pharmacy Records  ondansetron  (ZOFRAN -ODT) 4 MG disintegrating tablet [id number] unknown Take 1 tablet (4 mg total) by mouth every 8 (eight) hours as needed for nausea or vomiting. Rai, Nydia POUR, MD  Active   pantoprazole  (PROTONIX ) 40 MG tablet [id number] Yes Take 1 tablet (40 mg total) by mouth daily at 12 noon. Rai, Nydia POUR, MD  Active   Semaglutide ,0.25 or 0.5MG /DOS, (OZEMPIC , 0.25 OR 0.5 MG/DOSE,) 2 MG/3ML SOPN [id number] Not taking  Inject 0.5 mg into the skin once a week. Ok to fill early  Patient not taking: Reported on 11/03/2023   [name], NP  Active Self, Pharmacy Records            Recommendation:   PCP Follow-up Continue Current Plan of Care Contact [employer] related to food support Contact Stroke Warriers program  at [facility] for possible help with that program  Call LCSW as needed for SW support  Follow Up Plan:   Telephone follow up appointment date/time:  12/13/23 at 10:00 AM   Glendia Pear  MSW, LCSW [place] Licensed Clinical Social Worker Direct Dial:  [phone] Fax:  [phone] Website:  [email or web address]

## 2023-11-03 NOTE — Patient Outreach (Signed)
 Frank Schneider

## 2023-11-03 NOTE — Telephone Encounter (Signed)
 Copied from CRM [id number]. Topic: Referral - Question >> Nov 03, 2023 11:34 AM Emylou G wrote: Reason for CRM: Rcvd referral [facility] clinical social worker for [employer] [facility] dept ) - just wanted to talk about his daily needs ( mobility, getting aid at home, and challenges ) Pls call him back to discuss:  [phone]  Glendia was advised that a 3051 form was filled out and faxed back to [employer].  Was advised by Charice a new order will be needed to be filled out and she was given the old order to go by. Info concerning food pantries were placed out in the mail . KH

## 2023-11-03 NOTE — Patient Instructions (Signed)
 Visit Information  Thank you for taking time to visit with me today. Please don't hesitate to contact me if I can be of assistance to you before our next scheduled appointment.  Our next appointment is by telephone on 12/13/23 at 10:00 AM   Please call the care guide team at [phone] if you need to cancel or reschedule your appointment.   Following is a copy of your care plan:   Goals Addressed             This Visit's Progress    VBCI Social Work Care Plan       Problems:   Transport needs occasionally              Pain issues             Food scarcity issues              Decreased family support             Walking challenges ;has to take rest breaks when walking              ADLs completion challenges              Housing needs              Depression issues  CSW Clinical Goal(s):   Over the next 30 days the Patient will attend all scheduled medical appointments as evidenced by patient report and care team review of appointment completion in electronic MEDICAL RECORD NUMBER .           Over the next 30 days patient will research community resources of help to client  AEB patient contacting several community agencies for possible help for client   Interventions:  Discussed medication procurement for client              Discussed ADLs completion. Client said it takes him longer to complete ADLs             Discussed mobility challenges.He takes rest breaks when walking               Discussed housing needs o client.  Client is concerned about possible mold issue at his residence            Discussed Stroke Warriers program through [facility] and encouraged client to contact that program through [facility] . He said he would contact Stroke Warriers program at [facility]             Encouraged client to contact [employer] in [place] to seek help with food resources (they have community garden program for food support)              Encouraged client  to call NP [name] to talk with NP about mobility issues of client and about client request for motorized scooter                Provided counseling support               Contacted office of NP [name] and requested call back from office nurse to discuss client request for CNA support in the home               Discussed pain issues of client.                Discussed podiatry care. Client has an upcoming podiatry appointment  Discussed mood issues of client. He is sad sometimes thinking about his current housing situation and trying to plan for upcoming housing needs              Encouraged client to call LCSW as needed for SW support at [phone]                 Patient Goals/Self-Care Activities:  Take medications as prescribed              Attend scheduled medical appointments as needed              Allow time for ADLs completion              Call LCSW as needed for SW support at [phone]              Contact [employer] related to food support               Contact Stroke Warriers program at [facility] to learn more about possible program support        Plan:   Telephone follow up appointment with care management team member scheduled for:  12/13/23 at 10:00 AM        Please go to [facility] [address] [place] [phone]) if you are experiencing a Mental Health or Behavioral Health Crisis or need someone to talk to.  The patient verbalized understanding of instructions, educational materials, and care plan provided today and DECLINED offer to receive copy of patient instructions, educational materials, and care plan.    Glendia Pear  MSW, LCSW [place] Licensed Clinical Social Worker Direct Dial:  [phone] Fax:  [phone] Website:  [email or web address]

## 2023-11-04 ENCOUNTER — Other Ambulatory Visit: Payer: Self-pay

## 2023-11-04 ENCOUNTER — Other Ambulatory Visit: Payer: Self-pay | Admitting: Nurse Practitioner

## 2023-11-04 ENCOUNTER — Telehealth: Payer: Self-pay

## 2023-11-04 ENCOUNTER — Encounter: Payer: Self-pay | Admitting: Podiatry

## 2023-11-04 ENCOUNTER — Ambulatory Visit: Admitting: Podiatry

## 2023-11-04 DIAGNOSIS — M2041 Other hammer toe(s) (acquired), right foot: Secondary | ICD-10-CM | POA: Diagnosis not present

## 2023-11-04 DIAGNOSIS — M2042 Other hammer toe(s) (acquired), left foot: Secondary | ICD-10-CM | POA: Diagnosis not present

## 2023-11-04 DIAGNOSIS — R5381 Other malaise: Secondary | ICD-10-CM

## 2023-11-04 NOTE — Progress Notes (Signed)
 Contacted patient to follow-up on diabetes medications and blood sugars, given BG of 42 mg/dL on most recent BMP and multiple mediation changes. Patient was not able to talk at this time as he was leaving another appointment, but said he would be available tomorrow at 11AM. Will follow-up with patient then.   Lorain Baseman, PharmD [facility] [phone]

## 2023-11-05 ENCOUNTER — Other Ambulatory Visit: Payer: Self-pay

## 2023-11-05 ENCOUNTER — Other Ambulatory Visit (HOSPITAL_COMMUNITY): Payer: Self-pay

## 2023-11-05 DIAGNOSIS — I70213 Atherosclerosis of native arteries of extremities with intermittent claudication, bilateral legs: Secondary | ICD-10-CM

## 2023-11-05 MED ORDER — CILOSTAZOL 100 MG PO TABS
100.0000 mg | ORAL_TABLET | Freq: Two times a day (BID) | ORAL | 11 refills | Status: AC
  Filled 2023-11-05 – 2023-11-09 (×2): qty 60, 30d supply, fill #0
  Filled 2023-11-30 – 2023-12-02 (×2): qty 60, 30d supply, fill #1
  Filled 2023-12-30 – 2024-01-11 (×3): qty 60, 30d supply, fill #2
  Filled 2024-02-03: qty 60, 30d supply, fill #3
  Filled 2024-03-06: qty 60, 30d supply, fill #4
  Filled 2024-04-04: qty 60, 30d supply, fill #5
  Filled 2024-05-05: qty 60, 30d supply, fill #6

## 2023-11-05 NOTE — Progress Notes (Signed)
 11/05/2023 Name: Frank Schneider MRN: [id number] DOB: December 13, 1975  Chief Complaint  Patient presents with   Diabetes   Hypertension   Hyperlipidemia    Frank Schneider is a 48 y.o. year old male who presented for a telephone visit.   They were referred to the pharmacist by their PCP for assistance in managing diabetes. PMH includes CVA (2016, 2020), PAD, T2DM, tobacco dependence, HLD,    Subjective: Patient was last seen by PCP, Frank Borer, NP, on 10/20/23. At last visit, BP was 123/80 mmHg, HR 68. Patient had recently been hospitalized from 08/29/23 to 09/03/23 for SIRS with gastritis (possible gastoenteritis vs gastroparesis due to Ozempic ). He was discharged on a liquid diet and told to hold Ozempic . He has only been taking Lantus  and Novolog  since discharge. His labwork from PCP appt on 10/20/23 was stable, but BG on BMP was 42 mg/dL.   Today, patient reports doing ok. No specific complaints at this time. He has not had any recurrent Schneider/sx of nausea or vomiting over the past month.    Care Team: Primary Care Provider: Borer Frank RAMAN, NP ; Next Scheduled Visit: 01/20/24  Medication Access/Adherence  Current Pharmacy:  DARR[facility] - [facility] [address] [place] [address] Phone: [phone] Fax: [phone]  [facility] - [place], [place] - [address] [place] [address] Phone: [phone] Fax: [phone]   Patient reports affordability concerns with their medications: No  Patient reports access/transportation concerns to their pharmacy: No  Patient reports adherence concerns with their medications:  No  - denies missed doses of Lantus  or Novolog . He is a [place] compliance packaging patient.   Diabetes:  Current medications: Lantus  (insulin  glargine) 14 units daily, Novolog  (insulin  aspart) 4 units before each meal (typically 2x per day) Medications tried in the past: Ozempic   Denies any  recent GI upset like nausea or vomiting.  Current glucose readings:  Using Accu Chek Guide meter; testing every day (fasting) 11/04/23: 220 mg/dL 2/0/74: 854 mg/dL 05/28/72: 819 mg/dL 05/29/72: 880 mg/dL 05/30/72: 883 mg/dL 05/31/72: 80 mg/dL 06/01/72: 883 mg/dL   He does have some Freestyle Libre 3 sensors left at home, but has not been able to connect them to his phone. He was last connected to LibreView on 08/26/23.    Discussed that his last BMP BG was 42 mg/dL, but he did not remember having any specific symptoms at this time.   Patient denies hypoglycemic Schneider/sx including dizziness, shakiness, sweating. Patient denies hyperglycemic symptoms including polyuria, polydipsia, polyphagia, nocturia, neuropathy, blurred vision.  Current meal patterns: Usually eats 2 meals per day.  Hypertension:  Current medications: amlodipine  10 mg daily, carvedilol  12.5 mg BID, lisinopril  40 mg daily  Patient has a validated, automated, upper arm home BP cuff - home health nurse checks once/week.  Current blood pressure readings readings: Does not recall specific readings, but reports it has been normal  Patient denies hypotensive Schneider/sx including dizziness, lightheadedness.  Patient denies hypertensive symptoms including headache, chest pain, shortness of breath   Hyperlipidemia/ASCVD Risk Reduction  Current lipid lowering medications: atorvastatin  40 mg daily  Antiplatelet regimen: aspirin  325 mg daily, cilostazol  100 mg BID (Rx expired 09/03/22)  ASCVD History: stroke, PAD Risk Factors: tobacco use, known ASCVD, T2DM  Clinical ASCVD: Yes  The ASCVD Risk score (Arnett DK, et al., 2019) failed to calculate for the following reasons:   Risk score cannot be calculated because patient has a medical history suggesting  prior/existing ASCVD    Objective:  BP Readings from Last 3 Encounters:  10/20/23 123/80  09/03/23 (!) 159/72  03/24/23 (!) 149/77    Lab Results  Component Value Date   HGBA1C 8.9  (H) 08/31/2023   HGBA1C 9.0 (H) 08/29/2023   HGBA1C 7.4 (A) 01/07/2023       Latest Ref Rng & Units 10/20/2023    2:56 PM 09/03/2023    5:23 AM 09/02/2023    3:41 AM  BMP  Glucose 70 - 99 mg/dL 42  815  823   BUN 6 - 24 mg/dL 11  9  10    Creatinine 0.76 - 1.27 mg/dL 9.04  9.17  9.18   BUN/Creat Ratio 9 - 20 12     Sodium 134 - 144 mmol/L 142  137  137   Potassium 3.5 - 5.2 mmol/L 4.1  4.1  3.9   Chloride 96 - 106 mmol/L 104  103  103   CO2 20 - 29 mmol/L 22  27  28    Calcium  8.7 - 10.2 mg/dL 9.9  8.5  8.6     Lab Results  Component Value Date   CHOL 88 (L) 06/22/2022   HDL 30 (L) 06/22/2022   LDLCALC 43 06/22/2022   TRIG 380 (H) 08/30/2023   CHOLHDL 2.9 06/22/2022    Medications Reviewed Today     Reviewed by Frank Schneider, RPH (Pharmacist) on 11/05/23 at 1119  Med List Status: <None>   Medication Order Taking? Sig Documenting Provider Last Dose Status Informant  Accu-Chek Softclix Lancets lancets [id number]  Use in the morning and at bedtime Nichols, Tonya S, NP  Active Self, Pharmacy Records  acetaminophen  (TYLENOL ) 500 MG tablet [id number]  Take 2 tablets (Total= 1,000 mg), by mouth, 2 times a day as needed. Jegede, Olugbemiga E, MD  Active Self, Pharmacy Records  amLODipine  (NORVASC ) 10 MG tablet [id number] Yes Take 1 tablet (10 mg total) by mouth daily. Frank Frank RAMAN, NP  Active   aspirin  325 MG tablet [id number] Yes Take 1 tablet (325 mg total) by mouth daily. Frank Frank RAMAN, NP  Active Self, Pharmacy Records  atorvastatin  (LIPITOR) 40 MG tablet [id number] Yes Take 1 tablet (40 mg total) by mouth daily. Frank Frank RAMAN, NP  Active Self, Pharmacy Records  benzonatate  (TESSALON ) 200 MG capsule [id number]  Take 1 capsule (200 mg total) by mouth 3 (three) times daily as needed for cough. Davia Nydia POUR, MD  Active    Patient not taking:   Discontinued 06/22/22 1418 Blood Glucose Monitoring Suppl (ACCU-CHEK GUIDE) w/Device KIT [id number]  Use to test blood sugar 3 times daily   Patient not taking: Reported on 11/03/2023   Rai, Nydia POUR, MD  Active   carvedilol  (COREG ) 12.5 MG tablet [id number] Yes Take 1 tablet (12.5 mg total) by mouth 2 (two) times daily with a meal. Rai, Ripudeep K, MD  Active   cilostazol  (PLETAL ) 100 MG tablet [id number]  Take 1 tablet (100 mg total) by mouth 2 (two) times daily before a meal.  Patient not taking: Reported on 11/03/2023   Gretta Lonni PARAS, MD  Active Self, Pharmacy Records  Continuous Glucose Sensor (FREESTYLE LIBRE 3 [place]) [place] [id number]  Place 1 sensor on the skin every 14 days. Use to check glucose continuously Paseda, Folashade R, FNP  Active Self, Pharmacy Records  dicyclomine  (BENTYL ) 20 MG tablet [id number]  Take 1 tablet (20 mg total) by mouth 2 (two) times daily as needed for spasms. Small, Brooke  L, PA  Active Self, Pharmacy Records  food thickener (SIMPLYTHICK, HONEY/LEVEL 3/MODERATELY THICK,) LIQD [id number]  Take 10 packets by mouth as needed. Rai, Nydia POUR, MD  Active   Glucose Blood (BLOOD GLUCOSE TEST STRIPS) STRP [id number]  Use 3 (three) times daily as directed to check blood sugar. Davia Nydia POUR, MD  Active     Discontinued 06/22/22 1418 guaiFENesin -dextromethorphan  (ROBITUSSIN DM) 100-10 MG/5ML syrup [id number]  Take 5 mLs by mouth every 4 (four) hours as needed for cough.  Patient not taking: Reported on 11/03/2023   Davia Nydia POUR, MD  Active   insulin  aspart (NOVOLOG ) 100 UNIT/ML FlexPen [id number] Yes Inject 4 Units into the skin 3 (three) times daily with meals. Only take if eating a meal AND Blood Glucose (BG) is 80 or higher. Rai, Nydia POUR, MD  Active   insulin  glargine (LANTUS  SOLOSTAR) 100 UNIT/ML Solostar Pen [id number] Yes Inject 12 Units into the skin at bedtime. Rai, Ripudeep POUR, MD  Active   Insulin  Pen Needle (PEN NEEDLES) 31G X 5 MM MISC [id number]  Use 3 times daily as directed Rai, Ripudeep K, MD  Active   Lancets MISC [id number]  Use 3 (three) times daily as directed to check blood sugar. Rai,  Nydia POUR, MD  Active   Lancets Misc. (ACCU-CHEK SOFTCLIX LANCET DEV) KIT [id number]  Use 3 times daily Rai, Ripudeep K, MD  Active   lisinopril  (ZESTRIL ) 40 MG tablet [id number] Yes Take 1 tablet (40 mg total) by mouth daily. Rai, Nydia POUR, MD  Active   loperamide  (IMODIUM  A-D) 2 MG tablet [id number]  Take 1 tablet (2 mg total) by mouth 4 (four) times daily as needed for diarrhea or loose stools. Frank Frank RAMAN, NP  Active Self, Pharmacy Records  ondansetron  (ZOFRAN -ODT) 4 MG disintegrating tablet [id number]  Take 1 tablet (4 mg total) by mouth every 8 (eight) hours as needed for nausea or vomiting. Rai, Nydia POUR, MD  Active   pantoprazole  (PROTONIX ) 40 MG tablet [id number]  Take 1 tablet (40 mg total) by mouth daily at 12 noon. Rai, Nydia POUR, MD  Active   Semaglutide ,0.25 or 0.5MG /DOS, (OZEMPIC , 0.25 OR 0.5 MG/DOSE,) 2 MG/3ML SOPN [id number]  Inject 0.5 mg into the skin once a week. Ok to fill early Frank Frank RAMAN, NP  Active Self, Pharmacy Records              Assessment/Plan:   Diabetes: - Currently uncontrolled with most recent A1C of 8.9% above goal <7%. Medication adherence appears optimal. Patient reported FBG are variable. Hesitant to increase insulin  dose today with some FBG at goal and recent BG on BMP of 42 mg/dL. Would prefer to patient restart wearing CGM to detect whether he is having any hypoglycemia. He is amenable to coming in person for CGM setup. Unclear whether GI symptoms leading to hospitalization in May 2025 were related to GLP-1RA, but given current glycemic control and severity of episode, will not restart at this time.   - Last UACR of 13 mg/g on 06/22/22 - Patient denies personal or family history of multiple endocrine neoplasia type 2, medullary thyroid  cancer; personal history of pancreatitis or gallbladder disease. - Reviewed long term cardiovascular and renal outcomes of uncontrolled blood sugar - Reviewed goal A1c, goal fasting, and goal 2 hour post  prandial glucose - Reviewed hypoglycemia management plan and the rule of 15 - Reviewed dietary modifications including  utilizing the healthy plate method, limiting portion size of carbohydrate foods, increasing intake of protein  and non-starchy vegetables. Counseled patient to stay hydrated with water throughout the day. - Recommend to continue Lantus  14 units daily and Novolog  4 units before each meal (typically twice daily)  - Recommend to check glucose twice daily: fasting and 2-hr PPG continuously using FL3+ CGM. Educated patient to bring supplies for setup at next appointment. In the meantime, continuing monitoring BG before each meal.  - Next A1C due 12/01/2023    Hypertension: - Currently controlled with last clinic BP below goal less than 130/80. Patient is not having Schneider/sx of hypo- or hyper-tension. Medication adherence appears appropriate.  - Reviewed long term cardiovascular and renal outcomes of uncontrolled blood pressure - Reviewed appropriate blood pressure monitoring technique and reviewed goal blood pressure. Recommended to check home blood pressure and heart rate once weekly with home health nurse.  - Recommend to continue amlodipine  10 mg daily, carvedilol  12.5 mg BID, lisinopril  40 mg daily    Hyperlipidemia/ASCVD Risk Reduction: - Currently controlled with most recent LDL-C of 43 mg/g below goal < 55 mg/dL given premature and progressive ASCVD. High intensity statin indicated. Noted that recent TG were elevated to 380 mg/dL. Expect improvement with continued adherence to insulin  and improved glycemic control. Noted that prescription for cilostazol  expired in May, which patient was taking for claudication. He does not have any contraindications to continued therapy. Will request that PCP provides refill so this can be included in the patient'Schneider next compliance packaging  - Reviewed long term complications of uncontrolled cholesterol - Recommend to continue atorvastatin  40 mg daily.   - Recommend to continue aspirin  325 mg daily - Recommend to restart cilostazol  100 mg BID. Will collaborate with PCP to place orders.    Follow Up Plan:  Pharmacist in person 12/01/23 PCP clinic visit 01/20/24   Lorain Baseman, PharmD [facility] [phone]

## 2023-11-05 NOTE — Progress Notes (Signed)
 Subjective:   Patient ID: Frank Schneider, male   DOB: 48 y.o.   MRN: [id number]   HPI Patient's not been here for a number of years and presents with digital deformities of both feet that at times are hard to wear shoe gear with with concomitant lesions and moderate nail disease.  Patient smokes 5 cigarettes/day tries to be active   Review of Systems  All other systems reviewed and are negative.       Objective:  Physical Exam Vitals and nursing note reviewed.  Constitutional:      Appearance: He is well-developed.  Pulmonary:     Effort: Pulmonary effort is normal.  Musculoskeletal:        General: Normal range of motion.  Skin:    General: Skin is warm.  Neurological:     Mental Status: He is alert.     Neurovascular status intact with patient having had history of stroke diabetes that he does not do a good job of taking care of.  There is no open lesions there is no ulcerations or other pathology on his feet and he does have good range of motion muscle strength.  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  Today I did courtesy debridement of lesions bilateral and nailbeds bilateral no iatrogenic bleeding reappoint for routine care as needed or if any issues were to occur

## 2023-11-06 ENCOUNTER — Other Ambulatory Visit (HOSPITAL_COMMUNITY): Payer: Self-pay

## 2023-11-08 ENCOUNTER — Other Ambulatory Visit: Payer: Self-pay

## 2023-11-09 ENCOUNTER — Other Ambulatory Visit: Payer: Self-pay

## 2023-11-10 DIAGNOSIS — R3981 Functional urinary incontinence: Secondary | ICD-10-CM | POA: Diagnosis not present

## 2023-11-10 DIAGNOSIS — E119 Type 2 diabetes mellitus without complications: Secondary | ICD-10-CM | POA: Diagnosis not present

## 2023-11-11 ENCOUNTER — Telehealth: Payer: Self-pay | Admitting: Nurse Practitioner

## 2023-11-11 NOTE — Telephone Encounter (Signed)
 Copied from CRM [id number]. Topic: General - Other >> Nov 10, 2023  4:25 PM Tiffany S wrote: Reason for CRM: Please follow up with [employer] rep regarding patient  [phone] Nena

## 2023-11-15 ENCOUNTER — Ambulatory Visit: Admitting: Physical Therapy

## 2023-11-15 ENCOUNTER — Other Ambulatory Visit: Payer: Self-pay

## 2023-11-18 ENCOUNTER — Other Ambulatory Visit: Payer: Self-pay

## 2023-11-19 ENCOUNTER — Ambulatory Visit: Attending: Nurse Practitioner | Admitting: Physical Therapy

## 2023-11-19 NOTE — Therapy (Incomplete)
 OUTPATIENT PHYSICAL THERAPY WHEELCHAIR EVALUATION   Patient Name: Frank Schneider MRN: [id number] DOB:28-Aug-1975, 48 y.o., male Today's Date: 11/19/2023  END OF SESSION:   Past Medical History:  Diagnosis Date   Anticoagulated    ASA 325 mg--- ;managed by pcp  (stroke prevention)   Beta thalassemia trait    hematology/ oncologist--- dr lanny (arnetta in epic 11-11-2022  elevated A2   Chronic foot pain    Diabetic neuropathy (HCC)    DOE (dyspnea on exertion)    02-23-2023  per pt sob w/ stairs , yard work, and ok with household chores,  does not do any time of walking or exercise   History of cerebrovascular accident (CVA) with residual deficit 12/31/2014   admission in epic;   right thalamic infarct second SVD residual LUE / LLE numb/ ting with sensory deficit   History of eye trauma    02-23-2023  per pt eye injury age late 29s , never seek medical attention,  vision became progressive worse over time;  then pt had stroke 01/ 2020 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,  s/p eye removal 02/ 2024   History of ischemic stroke 05/09/2015   admission in epic;   acute ischemia right ventral pons,  pt was taking his plavix    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 gretta;  taking pletal  twice daily   Leucocytosis    Numbness and tingling of left arm and leg    stroke residual,  sensory deficit resolve    Type 2 diabetes mellitus (HCC)    followed by pcp;   (02-23-2023  per pt has Libre 3,  blood sugar check multiple times daily,  stated average fasting sugar 120s,  stopped doing lantus  daily approx 09/ 2024,  only taking jardiance  daily and weely ozempic )   Vision abnormalities    Vitamin D  deficiency 12/2019   Wears glasses    Past Surgical History:  Procedure Laterality Date   CATARACT EXTRACTION W/ INTRAOCULAR LENS IMPLANT Right 05/27/2016   EVISCERATION Right 06/11/2022   Procedure: EVISCERATION REPAIR WITH POSSIBLE INCLUSION;  Surgeon: Rodgers Faden, MD;  Location: [facility] OR;  Service: Ophthalmology;  Laterality: Right;   INCISION AND DRAINAGE PERIRECTAL ABSCESS  07/28/2011   Procedure: IRRIGATION AND DEBRIDEMENT PERIRECTAL ABSCESS;  Surgeon: Elon CHRISTELLA Pacini, MD;  Location: [facility] ORS;  Service: General;  Laterality: N/A;   of skin muscle and subcutaneous tissue of perimeum  8cmx12cm area    IR GENERIC HISTORICAL  01/08/2016   IR RADIOLOGIST EVAL & MGMT 01/08/2016 [facility] INTERV RAD   LAPAROSCOPIC APPENDECTOMY N/A 12/16/2015   Procedure: APPENDECTOMY LAPAROSCOPIC;  Surgeon: Vicenta Poli, MD;  Location: [facility];  Service: General;  Laterality: N/A;   PRIAPISM REPAIR     age 3s   (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)   Patient Active Problem List   Diagnosis  Date Noted   Lactic acidosis 08/29/2023   Hypertensive urgency 08/29/2023   Abdominal pain 08/29/2023   Nausea and vomiting 08/29/2023   Atherosclerosis of native arteries of extremity with intermittent claudication (HCC) 03/24/2022   Type 2 diabetes mellitus with hyperglycemia, without long-term current use of insulin  (HCC) 01/30/2022   Vitamin D  deficiency 09/15/2021   Anxiety and depression 09/24/2019   Chronic left flank pain 09/24/2019   Dysphagia 04/30/2018   Ischemic stroke (HCC) 04/29/2018   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   Acute appendicitis 12/16/2015   Right cataract  10/04/2015   Tobacco dependence 10/04/2015   Absolute anemia 08/07/2015   Neuropathy 08/07/2015   Cerebrovascular accident (CVA) (HCC)    Leukocytosis    Stroke (cerebrum) (HCC) 05/09/2015   Type 2 diabetes mellitus with circulatory disorder (HCC) 03/12/2015   Hyperlipidemia 02/14/2015   History of CVA (cerebrovascular accident) 02/14/2015   CVA (cerebral vascular accident) (HCC) 01/01/2015   Diabetes mellitus type 2 with complications, uncontrolled 01/01/2015   Stroke (HCC)    Essential hypertension    Smoker    Tobacco use     PCP: [name], NP  REFERRING PROVIDER: [name], NP  THERAPY DIAG:  No diagnosis found.  Rationale for Evaluation and Treatment Habilitation  SUBJECTIVE:                                                                                                                                                                                           SUBJECTIVE STATEMENT: Pt presents for wheelchair evaluation. ***  PRECAUTIONS: {Therapy precautions:24002}   RED FLAGS: {PT Red Flags:29287}  WEIGHT BEARING RESTRICTIONS {Yes ***/No:24003}    OCCUPATION: ***  PLOF:  {PLOF:24004}  PATIENT GOALS: ***         MEDICAL HISTORY:  Primary diagnosis onset: ***     Medical Diagnosis with ICD-10 code: ***   [] Progressive disease  Relevant future surgeries:     Height:  Weight:  Explain recent changes or trends in weight:      History:  Past Medical History:  Diagnosis Date   Anticoagulated    ASA 325 mg--- ;managed by pcp  (stroke prevention)   Beta thalassemia trait    hematology/ oncologist--- dr lanny (lov in epic 11-11-2022  elevated A2   Chronic foot pain    Diabetic neuropathy (HCC)    DOE (dyspnea on exertion)    02-23-2023  per pt sob w/ stairs , yard work, and ok with household chores,  does not do any time of walking or exercise   History of cerebrovascular accident (CVA) with residual deficit 12/31/2014   admission in  epic;   right thalamic infarct second SVD residual LUE / LLE numb/ ting with sensory deficit   History of eye trauma    02-23-2023  per pt eye injury age late 46s , never seek medical attention,  vision became progressive worse over time;  then pt had stroke 01/ 2020 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,  s/p eye removal 02/ 2024   History of ischemic stroke 05/09/2015   admission in epic;   acute ischemia right ventral pons,  pt was taking his plavix    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 gretta;  taking pletal  twice daily   Leucocytosis    Numbness and tingling of left arm and leg    stroke residual,  sensory deficit resolve   Type 2 diabetes mellitus (HCC)    followed by pcp;   (02-23-2023  per pt has Libre 3,  blood sugar check multiple times daily,  stated average fasting sugar 120s,  stopped doing lantus  daily approx 09/ 2024,  only taking jardiance  daily and weely ozempic )   Vision abnormalities    Vitamin D  deficiency 12/2019   Wears glasses        Cardio Status:  Functional Limitations:   [] Intact  []  Impaired      Respiratory Status:  Functional Limitations:   [] Intact  [] Impaired   [] SOB [] COPD [] O2 Dependent ______LPM  [] Ventilator Dependent  Resp equip:                                                     Objective Measure(s):   Orthotics:   [] Amputee:                                                              [] Prosthesis:        HOME ENVIRONMENT:  [] House [] Condo/town home [] Apartment [] Asst living [] LTCF         [] Own  [] Rent   [] Lives alone [] Lives with others -                             Hours without assistance:   [] Home is accessible to patient                                 Storage of wheelchair:  [] In home   [] Other Comments:        COMMUNITY :  TRANSPORTATION:  [] Car [] Art therapist [] Adapted  w/c Lift []  Ambulance [] Other:                     [] Sits in wheelchair during transport   Where is w/c stored during transport?  [] [place]  []  [employer]  r   [] Self-Driver       Drive while in  Biomedical scientist [] yes [] no   Employment and/or school:  Specific requirements pertaining to mobility        Other:  COMMUNICATION:  Verbal Communication  [] WFL [] receptive [] WFL [] expressive [] Understandable  [] Difficult to understand  [] non-communicative  Primary Language:______________ 2nd:_____________  Communication provided by:[] Patient [] Family [] Caregiver [] Translator   [] Uses an augmentative communication device     Manufacturer/Model :                                                                MOBILITY/BALANCE:  Sitting Balance  Standing Balance  Transfers  Ambulation   [] WFL      [] WFL  [] Independent  []  Independent   [] Uses UE for balance in sitting Comments:  [] Uses UE/device for stability Comments:  []  Min assist  []  Ambulates independently with       device:___________________      []  Mod assist  []  Able to ambulate ______ feet        safely/functionally/independently   []  Min assist  []  Min assist  []  Max assist  []  Non-functional ambulator         History/High risk of falls   []  Mod assist  []  Mod assist  []  Dependent  []  Unable to ambulate   []  Max  assist  []  Max assist  Transfer method:[] 1 person [] 2 person [] sliding board [] squat pivot [] stand pivot [] mechanical patient lift  [] other:   []  Unable  []  Unable    Fall History: #  of falls in the past 6 months? *** # of "near" falls in the past 6 months? ***    CURRENT SEATING / MOBILITY:  Current Mobility Device: [] None [] Cane/Walker [] Manual [] Dependent [] Dependent w/ Tilt rScooter  [] Power (type of control):   Manufacturer:  Model:  Serial #:   Size:  Color:  Age:   Purchased by whom:   Current condition of mobility base:    Current seating system:                                                                       Age of seating system:    Describe posture in present seating system:    Is the current mobility meeting medical necessity?:  [] Yes [] No Describe:                                     Ability to complete Mobility-Related Activities of Daily Living (MRADL's) with Current Mobility Device:   Move room to room  [] Independent  [] Min [] Mod [] Max assist  [] Unable  Comments:   Meal prep  [] Independent  [] Min [] Mod [] Max  assist  [] Unable    Feeding  [] Independent  [] Min [] Mod [] Max assist  [] Unable    Bathing  [] Independent  [] Min [] Mod [] Max assist  [] Unable    Grooming  [] Independent  [] Min [] Mod [] Max assist  [] Unable    UE dressing  [] Independent  [] Min [] Mod [] Max assist  [] Unable    LE dressing  [] Independent   [] Min [] Mod [] Max assist  [] Unable    Toileting  [] Independent  [] Min [] Mod [] Max assist  [] Unable    Bowel Mgt: []  Continent []  Incontinent []  Accidents []  Diapers []  Colostomy []  Bowel Program:  Bladder Mgt: []  Continent []  Incontinent []  Accidents []  Diapers []  Urinal []  Intermittent Cath []  Indwelling Cath []  Supra-pubic Cath     Current Mobility Equipment Trialed/ Ruled Out:    Does not meet mobility needs due to:    Mark all boxes that indicate inability to use the specific equipment listed     Meets needs for safe  independent functional  ambulation  / mobility    Risk of  Falling or History of Falls    Enviromental limitations      Cognition    Safety concerns with  physical ability    Decreased / limitations endurance   & strength     Decreased / limitations  motor skills  & coordination    Pain    Pace /  Speed    Cardiac and/or  respiratory condition    Contra - indicated by diagnosis   Cane/Crutches  []   []   []   []   []   []   []   []   []   []   []    Walker / Rollator  []  NA   []   []   []   []   []   []   []   []   []   []   []     Manual Wheelchair [id number]:  []  NA  []   []   []   []   []   []   []   []   []   []   []    Manual W/C (K0005) with power assist  []  NA  []   []   []   []   []   []   []   []   []   []   []    Scooter  []  NA  []   []   []   []   []   []   []   []   []   []   []    Power Wheelchair: standard joystick  []  NA  []   []   []   []   []   []   []   []   []   []   []    Power Wheelchair: alternative controls  []  NA  []   []   []   []   []   []   []   []   []   []   []    Summary:  The least costly alternative for independent functional mobility was found to be:    []  Crutch/Cane  []  Walker []  Manual w/c  []  Manual w/c with power assist   []  Scooter   []  Power w/c std joystick   []  Power w/c alternative control        []  Requires dependent care mobility Market researcher for [employer] skills are adequate for safe mobility equipment operation  []   Yes []   No  Patient is willing and motivated to use recommended mobility equipment  []   Yes []   No       []  Patient is unable to safely operate mobility equipment independently and requires dependent care equipment Comments:  SENSATION and SKIN ISSUES:  Sensation []  Intact  []  Impaired []  Absent []  Hyposensate []  Hypersensate  []  Defensiveness  Location(s) of impairment:    Pressure Relief Method(s):  []  Lean side to side to offload (without risk of falling)  []   W/C push up (4+ times/hour for 15+ seconds) []  Stand up (without risk of falling)    []  Other: (Describe): Effective pressure relief method(s) above can be performed consistently throughout the day: [] Yes  []  No If not, Why?:  Skin Integrity Risk:       []  Low risk           []   Moderate risk            []  High risk  If high risk, explain:   Skin Issues/Skin Integrity  Current skin Issues  []  Yes []  No []  Intact  []   Red area   []   Open area  []  Scar tissue  []  At risk from prolonged sitting  Where: History of Skin Issues  []  Yes []  No Where : When: Stage: Hx of skin flap surgeries  []  Yes []  No Where:  When:  Pain: []  Yes []  No   Pain Location(s):  Intensity scale: (0-10) : How does pain interfere with mobility and/or MRADLs? -         MAT EVALUATION:  Neuro-Muscular Status: (Tone, Reflexive, Responses, etc.)     []   Intact   []  Spasticity:  []  Hypotonicity  []  Fluctuating  []  Muscle Spasms  []  Poor Righting Reactions/Poor Equilibrium Reactions  []  Primal Reflex(s):    Comments:            COMMENTS:    POSTURE:     Comments:  Pelvis Anterior/Posterior:  []  Neutral   []  Posterior  []  Anterior  []  Fixed - No movement []  Tendency away from neutral []  Flexible []  Self-correction []  External correction Obliquity (viewed from front)  []  WFL []  R Obliquity []  L Obliquity  []  Fixed - No movement []  Tendency away from neutral []  Flexible []  Self-correction []  External correction Rotation  []  WFL []  R anterior []  L anterior  []  Fixed - No movement []  Tendency away from neutral []  Flexible []  Self-correction []  External correction Tonal Influence Pelvis:  []  Normal []  Flaccid []  Low tone []  Spasticity []  Dystonia []  Pelvis thrust []  Other:    Trunk Anterior/Posterior:  []  WFL []  Thoracic kyphosis []  Lumbar lordosis  []  Fixed - No movement []  Tendency away from neutral []  Flexible []  Self-correction []  External correction  []  WFL []  Convex to left  []  Convex to right []  S-curve   []  C-curve []  Multiple curves []  Tendency away from neutral []  Flexible []  Self-correction []  External correction Rotation of shoulders and upper trunk:  []  Neutral []  Left-anterior []  Right- anterior []  Fixed- no  movement []  Tendency away from neutral []  Flexible []  Self correction []  External correction Tonal influence Trunk:  []  Normal []  Flaccid []  Low tone []  Spasticity []  Dystonia []  Other:   Head & Neck  []  Functional []  Flexed    []  Extended []  Rotated right  []  Rotated left []  Laterally flexed right []  Laterally flexed left []  Cervical hyperextension   []  Good head control []  Adequate head control []  Limited head control []  Absent head control Describe tone/movement of head and neck:      Lower Extremity Measurements: LE ROM:  {AROM/PROM:27142} ROM Right 11/19/2023 Left 11/19/2023  Hip flexion    Hip  extension    Hip abduction    Hip adduction    Knee flexion    Knee extension    Ankle dorsiflexion    Ankle plantarflexion     (Blank rows = not tested)  LE MMT:  MMT Right 11/19/2023 Left 11/19/2023  Hip flexion    Hip extension    Hip abduction    Hip adduction    Knee flexion    Knee extension    Ankle dorsiflexion    Ankle plantarflexion     (Blank rows = not tested)  Hip positions:  []  Neutral   []  Abducted   []  Adducted  []  Subluxed   []  Dislocated   []  Fixed   []  Tendency away from neutral []  Flexible []  Self-correction []  External correction   Hip Windswept:[]  Neutral  []  Right    []  Left  []  Subluxed   []  Dislocated   []  Fixed   []  Tendency away from neutral []  Flexible []  Self-correction []  External correction  LE Tone: []  Normal []  Low tone []  Spasticity []  Flaccid []  Dystonia []  Rocks/Extends at hip []  Thrust into knee extension []  Pushes legs downward into footrest  Foot positioning: ROM Concerns: Dorsiflexed: []  Right   []  Left Plantar flexed: []  Right    []  Left Inversion: []  Right    []  Left Eversion: []  Right    []  Left  LE Edema: []  1+ (Barely detectable impression when finger is pressed into skin) []  2+ (slight indentation. 15 seconds to rebound) []  3+ (deeper indentation. 30 seconds to rebound) []  4+ (>30  seconds to rebound)  UE Measurements:  UPPER EXTREMITY ROM:   {AROM/PROM:27142} ROM Right 11/19/2023 Left 11/19/2023  Shoulder flexion    Shoulder abduction    Shoulder adduction    Elbow flexion    Elbow extension    Wrist flexion    Wrist extension    (Blank rows = not tested)  UPPER EXTREMITY MMT:  MMT Right 11/19/2023 Left 11/19/2023  Shoulder flexion    Shoulder abduction    Shoulder adduction    Elbow flexion    Elbow extension    Wrist flexion    Wrist extension    Pinch strength    Grip strength    (Blank rows = not tested)  Shoulder Posture:  Right Tendency towards Left  []   Functional []    []   Elevation []    []   Depression []    []   Protraction []    []   Retraction []    []   Internal rotation []    []   External rotation []    []   Subluxed []     UE Tone: []  Normal []  Flaccid []  Low tone []  Spasticity  []  Dystonia []  Other:   UE Edema: []  1+ (Barely detectable impression when finger is pressed into skin) []  2+ (slight indentation. 15 seconds to rebound) []  3+ (deeper indentation. 30 seconds to rebound) []  4+ (>30 seconds to rebound)  Wrist/Hand: Handedness: []  Right   []  Left   []  NA: Comments:  Right  Left  []   WNL []    []   Limitations []    []   Contractures []    []   Fisting []    []   Tremors []    []   Weak grasp []    []   Poor dexterity []    []   Hand movement non functional []    []   Paralysis []         MOBILITY BASE RECOMMENDATIONS and JUSTIFICATION:  MOBILITY BASE  JUSTIFICATION  Manufacturer:    Model:                              Color:  Seat Width:   Seat Depth    []  Manual mobility base (continue below)   []  Scooter/POV  []  Power mobility base   Number of hours per day spent in above selected mobility base:   Typical daily mobility base use Schedule:    []  is not a safe, functional ambulator  []  limitation prevents from completing a MRADL(s) within a reasonable time frame    []  limitation places at high risk of morbidity  or mortality secondary to  the attempts to perform a    MRADL(s)  []  limitation prevents accomplishing a MRADL(s) entirely  []  provide independent mobility  []  equipment is a lifetime medical need  []  walker or cane inadequate  []  any type manual wheelchair      inadequate  []  scooter/POV inadequate      []  requires dependent mobility          MANUAL MOBILITY      []  Standard manual wheelchair  K0001      Arm:    []  both []  right  []  left      Foot:   []  both []  right   []  left  []  self-propels wheelchair  []  will use on regular basis  []  chair fits throughout home  []  willing and motivated to use  []  propels with assistance     []  dependent use   []  Standard hemi-manual wheelchair  K0002      Arm:    []  both []  right  []  left      Foot:   []  both []  right   []  left  []  lower seat height required to foot propel  []  short stature  []  self-propels wheelchair  []  will use on regular basis  []  chair fits throughout home  []  willing and motivated to use   []  propels with assistance  []  dependent use   []  Lightweight manual wheelchair  K0003      Arm:    []  both []  right  []  left      Foot:   []  both  []  right  []  left                   []  hemi height required  []  medical condition and weight of  wheelchair affect ability to self      propel standard manual wheelchair in the residence  []  can and does self-propel (marginal propulsion skills)  []  daily use _________hours  []  chair fits throughout home  []  willing and motivated to use  []  lower seat height required to foot propel  []  short stature   []  High strength lightweight manual  wheelchair (Breezy Ultra 4)  K0004     Arm:    []  both []  right  []  left     Foot:   []  both []  right   []  left                                                                  []  hemi height required []   medical condition and weight of wheelchair affect ability to self propel while engaging in frequent MRADL(s) that cannot be performed in a  standard or lightweight manual wheelchair  []  daily use _________hours  []  chair fits throughout home  []  willing and motivated to use  []  prevent repetitive use injuries   []  lower seat height required to foot propel  []  short stature    []  Ultra-lightweight manual wheelchair  K0005     Arm:    []  both []  right  []  left     Foot:   []  both []  right  []  left       []  hemi height required  []  heavy duty    Front seat to floor _____ inches      Rear seat to floor _____ inches      Back height _____ inches     Back angle ______ degrees      Front angle _____ degrees  []   full-time manual wheelchair user  []  Requires individualized fitting and optimal adjustments for multiple features that include adjustable axle configuration, fully adjustable center of gravity, wheel camber, seat and back angle, angle of seat slope, which cannot be accommodated by a K0001 through K0004 manual wheelchair  []  prevent repetitive use injuries  []  daily use_________hours   []  user has high activity patterns that frequently require  them  to go out into the community for the purpose of independently accomplishing high level MRADL activities. Examples of these might include a combination of; shopping, work, school, Photographer, childcare, independently loading and unloading from a vehicle etc.  []  lower seat height required to foot propel  []  short stature  []  heavy duty -  weight over 250lbs   []  Current chair is a K0005   manufacture:___________________  model:_________________  serial#____________________  age:_________    []  First time X9994 user (complete trial)  K0004 time and # of strokes to propel 30 feet: ________seconds _________strokes  X9994 time and # of strokes to propel 30 feet: ________seconds _________strokes  What was the result of the trial between the K0004 and K0005 manual wheelchair? ___    What features of the K0005 w/c are needed as compared to the K0004 base? Why?___    []   adjustable seat and back angle changes the angle of seat slope of the frame to attain a gravity assisted position for efficient propulsion and proper weight distribution along the frame     []  the front of the wheelchair will be configured higher than the back of the chair to allow gravity to assist the user with postural stability  []  the center of the wheel will be positioned for stability, safety and efficient propulsion  []  adjustable axle allows for vertical, horizontal, camber and overall width changes  throughout the wheels for adjustment of the client's exact needs and abilities.   []  adjustable axle increases the stability and function of the chair allowing for adjustment of the center of gravity.   []  accommodates the client's anatomical position in the chair maximizing independence in mobility and maneuverability in all environments.   []  create a minimal fixed tilt-in space to assist in positioning.   []  Describe users full-time manual wheelchair activity patterns:___    []  Power assist Comments:  []  prevent repetitive use injuries  []  repetitive strain injury present in    shoulder girdle    []  shoulder pain is (> or =) to 7/10     during manual propulsion  Current Pain _____/10  []  requires conservation of energy to participate in MRADL(s) runable to propel up ramps or curbs using manual wheelchair  []  been K0005 user greater than one year  []  user unwilling to use power      wheelchair (reason): []  less expensive option to power   wheelchair   []  rim activated power assist -      decreased strength   []  Heavy duty manual wheelchair       K0006     Arm:    []  both []  right  []  left     Foot:   []  both []  right  []  left     []  hemi height required    []  Dependent base  []  user exceeds 250lbs  []  non-functional ambulator    []  extreme spasticity  []  over active movement   []  broken frame/hx of repeated     repairs  []  able to self-propel in residence       []  lower  seat to floor height required  []  unable to self-propel in residence   []  Extra heavy duty manual wheelchair  K0007     Arm:    []  both []  right  []  left     Foot:   []  both []  right  []  left     []  hemi height required  []  Dependent base  []  user exceeds 300lbs  []  non-functional ambulator    []  able to self-propel in residence   []  lower seat to floor height required  []  unable to self-propel in residence     []  Manual wheelchair with tilt [phone]      (Manual "Tilt-n-Space")  []  patient is dependent for transfers  []  patient requires frequent       positioning for pressure relief   []  patient requires frequent      positioning for poor/absent trunk control        []  Stroller Base  []  infant/child   []  unable to propel manual      wheelchair  []  allows for growth  []  non-functional ambulator  []  non-functional UE  []  independent mobility is not a goal at this time    MANUAL FRAME OPTIONS      Push handles  []  extended   []  angle adjustable   []  standard  []  caregiver access  []  caregiver assist    []  allows "hooking" to enable      increased ability to perform ADLs or maintain balance   []  Angle Adjustable Back  []  postural control  []  control of tone/spasticity  []  accommodation of range of motion  []  UE functional control  []  accommodation for seating system    Rear wheel placement  []  std/fixed  [] fully adjustableramputee   []  camber ________degree  []  removable rear wheel  []  non-removable rear wheel  Wheel size _______  Wheel style_______________________  []  improved UE access to wheels  []  increase propulsion ability  []  improved stability  []  changing angle in space for      improvement of postural stability  []  remove for transport    []  allow for seating system to fit on  base  []  amputee placement  []  1-arm drive access   r R  r L  []  enable propulsion of manual       wheelchair with one arm    []  amputee placement   Wheel rims/ Hand rims  []  Standard     []   Specialized-____ []  provide ability to propel manual   []  increase self-propulsion with hand wheelchair weakness/decreased grasp     []  Spoke protector/guard   []  prevent hands from getting caught in spokes   Tires:  []  pneumatic  []  flat free inserts  []  solid  Style:  []  decrease roll resistance              []  prevent frequent flats  []  increase shock absorbency  []  decrease maintenance   []  decrease pain from road shock    []  decrease spasms from road shock    Wheel Locks:    []  push []  pull []  scissor  []  lock wheels for transfers  []  lock wheels from rolling   Brake/wheel lock extension:  []  R  []  L  []  allow user to operate wheel locks due to decreased reach or strength   Caster housing:  Caster size:                      Style:                                          []  suspension fork  []  maneuverability   []  stability of wheelchair   []  durability  []  maintenance  []  angle adjustment for posture  []  allow for feet to come under        wheelchair base  []  allows change in seat to floor  height   []  increase shock absorbency  []  decrease pain from road shock  []  decrease spasms from road    shock   []  Side guards  []  prevent clothing getting caught in wheel or becoming soiled   [] provide hip and pelvic stability  []  eliminates contact between body and wheels  []  limit hand contact with wheels   []  Anti-tippers      []  prevent wheelchair from tipping    backward  []  assist caregiver with curbs     POWER MOBILITY      []  Scooter/POV    []  can safely operate   []  can safely transfer   []  has adequate trunk stability   []  cannot functionally propel  manual wheelchair    []  Power mobility base    []  non-ambulatory   []  cannot functionally propel manual wheelchair   []  cannot functionally and safely      operate scooter/POV  []  can safely operate power       wheelchair  []  home is accessible  []  willing to use power wheelchair     Tilt  []  Powered tilt on  powered chair  []  Powered tilt on manual chair  []  Manual tilt on manual chair Comments:  []  change position for pressure      []  elief/cannot weight shift   []  change position against      gravitational force on head and      shoulders   []  decrease pain  []  blood pressure management   []  control autonomic dysreflexia  []  decrease respiratory distress  []  management of spasticity  []  management of low tone  []  facilitate postural control   []  rest periods   []  control edema  []  increase sitting tolerance   []  aid with transfers     Recline   []  Power recline on power chair  []  Manual recline on manual chair  Comments:    []  intermittent catheterization  []  manage spasticity  []  accommodate femur to back angle  []  change position for pressure relief/cannot weight shift rhigh risk of pressure sore development  []  tilt alone does not accomplish     effective pressure relief, maximum pressure relief achieved at -      _______ degrees tilt   _______ degrees recline   []  difficult to transfer to and from bed []  rest periods and sleeping in chair  []  repositioning for transfers  []  bring to full recline for ADL care  []  clothing/diaper changes in chair  []  gravity PEG tube feeding  []  head positioning  []  decrease pain  []  blood pressure management   []  control autonomic dysreflexia  []  decrease respiratory distress  []  user on ventilator     Elevator on mobility base  []  Power wheelchair  []  Scooter  []  increase Indep in transfers   []  increase Indep in ADLs    []  bathroom function and safety  []  kitchen/cooking function and safety  []  shopping  []  raise height for communication at standing level  []  raise height for eye contact which reduces cervical neck strain and pain  []  drive at raised height for safety and navigating crowds  []  Other:   []  Vertical position system  (anterior tilt)     (Drive locks-out)    []  Stand       (Drive enabled)  []  independent weight  bearing  []  decrease joint contractures  []  decrease/manage spasticity  []  decrease/manage spasms  []  pressure distribution away from   scapula, sacrum, coccyx, and ischial tuberosity  []  increase digestion and elimination   []  access to counters and cabinets  []  increase reach  []  increase interaction with others at eye level, reduces neck strain  []  increase performance of       MRADL(s)      Power elevating legrest    []  Center mount (Single) 85-170 degrees       []  Standard (Pair) 100-170 degrees  []  position legs at 90 degrees, not available with std power ELR  []  center mount tucks into chair to decrease turning radius in home, not available with std power ELR  []  provide change in position for LE  []  elevate legs during recline    []  maintain placement of feet on      footplate  []  decrease edema  []  improve circulation  []  actuator needed to elevate legrest  []  actuator needed to articulate legrest preventing knees from flexing  []  Increase ground clearance over      curbs  []   STD (pair) independently                     elevate legrest   POWER WHEELCHAIR CONTROLS      Controls/input device  []  Expandable  []  Non-expandable  []  Proportional  []  Right Hand []  Left Hand  []  Non-proportional/switches/head-array  []  Electrical/proximity         []   Mechanical      Manufacturer:___________________   Type:________________________ []  provides access for controlling wheelchair  []  programming for accurate control  []  progressive disease/changing condition  []  required for alternative drive      controls       []  lacks motor control to operate  proportional drive control  []  unable to understand proportional controls  []  limited movement/strength  []  extraneous movement / tremors / ataxic / spastic       []   Upgraded electronics controller/harness    []  Single power (tilt or recline)   []  Expandable    []  Non-expandable plus   []  Multi-power (tilt, recline, power  legrest, power seat lift, vertical positioning system, stand)  []  allows input device to communicate with drive motors  []  harness provides necessary connections between the controller, input device, and seat functions     []  needed in order to operate power seat functions through joystick/ input device  []  required for alternative drive controls     []  Enhanced display  []  required to connect all alternative drive controls   []  required for upgraded joystick      (lite-throw, heavy duty, micro)  []  Allows user to see in which mode and drive the wheelchair is set; necessary for alternate controls       []  Upgraded tracking electronics  []  correct tracking when on uneven surfaces makes switch driving more efficient and less fatiguing  []  increase safety when driving  []  increase ability to traverse thresholds    []  Safety / reset / mode switches     Type:    []  Used to change modes and stop the wheelchair when driving     []  Mount for joystick / input device/switches  []  swing away for access or transfers   []  attaches joystick / input device / switches to wheelchair   []  provides for consistent access  []  midline for optimal placement    []  Attendant controlled joystick plus     mount  []  safety  []  long distance driving  []  operation of seat functions  []  compliance with transportation regulations    []  Battery  []  required to power (power assist / scooter/ power wc / other):   []  Power inverter (24V to 12V)  []  required for ventilator / respiratory equipment / other:     CHAIR OPTIONS MANUAL & POWER      Armrests   []  adjustable height []  removable  []  swing away []  fixed  []  flip back  []  reclining  []  full length pads []  desk []  tube arms []  gel pads  []  provide support with elbow at 90    []  remove/flip back/swing away for  transfers  []  provide support and positioning of upper body    []  allow to come closer to table top  []  remove for access to tables  []  provide support  for w/c tray  []  change of height/angles for variable activities   []  Elbow support / Elbow stop  []  keep elbow positioned on arm pad  []  keep arms from falling off arm pad  during tilt and/or recline   Upper Extremity Support  []  Arm trough  []   R  []   L  Style:  []  swivel mount []  fixed mount   []  posterior hand support  []   tray  []  full tray  []  joystick cut out  []   R  []   L  Style:  []  decrease gravitational pull on      shoulders  []  provide support to increase UE  function  []  provide hand support in natural    position  []  position flaccid UE  []  decrease subluxation    []  decrease edema       []  manage spasticity   []  provide midline positioning  []  provide work surface  []  placement for AAC/ Computer/ EADL       Hangers/ Legrests   []  ______ degree  []   Elevating []  articulating  []  swing away []  fixed []  lift off  []  heavy duty  []  adjustable knee angle  []  adjustable calf panel   []  longer extension tube              []  provide LE support  []  maintain placement of feet on      footplate   []  accommodate lower leg length  []  accommodate to hamstring       tightness  []  enable transfers  []  provide change in position for LE's  []  elevate legs during recline    []  decrease edema  []  durability      Foot support   []  footplate []  R []  L []  flip up           []  Depth adjustable   []  angle adjustable  []  foot board/one piece    []  provide foot support  []  accommodate to ankle ROM  []  allow foot to go under wheelchair base  []  enable transfers     []  Shoe holders  []  position foot    []  decrease / manage spasticity  []  control position of LE  []  stability    []  safety     []  Ankle strap/heel      loops  []  support foot on foot support  []  decrease extraneous movement  []  provide input to heel   []  protect foot     []  Amputee adapter []  R  []  L     Style:                  Size:  []  Provide support for stump/residual extremity    []  Transportation tie-down   []  to provide crash tested tie-down brackets    []  Crutch/cane holder    []  O2 holder    []  IV hanger   []  Ventilator tray/mount    []  stabilize accessory on wheelchair       Component  Justification     []  Seat cushion      []  accommodate impaired sensation  []  decubitus ulcers present or history  []  unable to shift weight  []  increase pressure distribution  []  prevent pelvic extension  []  custom required "off-the-shelf"    seat cushion will not accommodate deformity  []  stabilize/promote pelvis alignment  []  stabilize/promote femur alignment  []  accommodate obliquity  []  accommodate multiple deformity  []  incontinent/accidents  []  low maintenance     []  seat mounts                 []  fixed []  removable  []  attach seat platform/cushion to wheelchair frame    []  Seat wedge    []  provide increased aggressiveness of seat shape to decrease sliding  down in the seat  []  accommodate ROM        []  Cover replacement   []  protect back or seat cushion  []  incontinent/accidents    []  Solid seat / insert    []  support cushion to prevent      hammocking  []  allows attachment of cushion to mobility base    []  Lateral pelvic/thigh/hip     support (Guides)     []  decrease abduction  []  accommodate pelvis  []  position upper legs  []  accommodate spasticity  []  removable for transfers     []  Lateral pelvic/thigh      supports mounts  []  fixed   []  swing-away   []  removable  []  mounts  lateral pelvic/thigh supports     []  mounts lateral pelvic/thigh supports swing-away or removable for transfers    []  Medial thigh support ([place]ommel)  [] decrease adduction  [] accommodate ROM  []  remove for transfers   []  alignment      []  Medial thigh   []  fixed      support mounts      []  swing-away   []  removable  []  mounts medial thigh supports   []  Mounts medial supports swing- away or removable for transfers       Component  Justification   []  Back       []  provide posterior trunk support []  facilitate  tone  []  provide lumbar/sacral support []  accommodate deformity  []  support trunk in midline   []  custom required "off-the-shelf" back support will not accommodate deformity   []  provide lateral trunk support []  accommodate or decrease tone            []  Back mounts  []  fixed  []  removable  []  attach back rest/cushion to wheelchair frame   []  Lateral trunk      supports  []  R []  L  []  decrease lateral trunk leaning  []  accommodate asymmetry    []  contour for increased contact  []  safety    []  control of tone    []  Lateral trunk      supports mounts  []  fixed  []  swing-away   []  removable  []  mounts lateral trunk supports     []  Mounts lateral trunk supports swing-away or removable for transfers   []  Anterior chest      strap, vest     []  decrease forward movement of shoulder  []  decrease forward movement of trunk  []  safety/stability  []  added abdominal support  []  trunk alignment  []  assistance with shoulder control   []  decrease shoulder elevation    []  Headrest      []  provide posterior head support  []  provide posterior neck support  []  provide lateral head support  []  provide anterior head support  []  support during tilt and recline  []  improve feeding     []  improve respiration  []  placement of switches  []  safety    []  accommodate ROM   []  accommodate tone  []  improve visual orientation   []  Headrest           []  fixed []  removable []  flip down      Mounting hardware   []  swing-away laterals/switches  []  mount headrest   []  mounts headrest flip down or  removable for transfers  []  mount headrest swing-away laterals   []  mount switches     []  Neck Support    []  decrease neck rotation  []  decrease forward neck flexion   Pelvic Positioner    []  std hip belt          []  padded hip belt  []  dual pull hip belt  []  four point hip belt  []  stabilize tone  []  decrease falling out of chair  []  prevent excessive extension  []  special pull angle to control      rotation   []  pad for protection over boney   prominence  []  promote comfort    []  Essential needs        bag/pouch   []  medicines []  special food rorthotics []  clothing changes  []  diapers  []  catheter/hygiene []  ostomy supplies   The above equipment has a life- long  use expectancy.  Growth and changes in medical and/or functional conditions would be the exceptions.   SUMMARY:  Why mobility device was selected; include why a lower level device is not appropriate: ***  ASSESSMENT:  CLINICAL IMPRESSION: Patient is a *** y.o. *** who was seen today for physical therapy evaluation and treatment for ***.    OBJECTIVE IMPAIRMENTS {opptimpairments:25111}.   ACTIVITY LIMITATIONS {activitylimitations:27494}  PARTICIPATION LIMITATIONS: {participationrestrictions:25113}  PERSONAL FACTORS {Personal factors:25162} are also affecting patient's functional outcome.   REHAB POTENTIAL: {rehabpotential:25112}  CLINICAL DECISION MAKING: {clinical decision making:25114}  EVALUATION COMPLEXITY: {Evaluation complexity:25115}                                   GOALS: One time visit. No goals established.    PLAN: PT FREQUENCY: one time visit    Waddell Southgate, PT Waddell Southgate, PT, DPT, CSRS  11/19/2023, 7:30 AM    I concur with the above findings and recommendations of the therapist:  Physician name printed:         Physician's signature:      Date:

## 2023-11-22 ENCOUNTER — Telehealth: Payer: Self-pay

## 2023-11-22 NOTE — Telephone Encounter (Signed)
 Copied from CRM [id number]. Topic: Clinical - Order For Equipment >> Nov 22, 2023 10:52 AM Donna BRAVO wrote: Reason for CRM: Boobie RN  Case manager for [facility] [phone] Assessed the patient on 11/22/23 and is requesting and would like an order for shower chair and suction grab bars would prevent falls in shower. Assist with ADL's [place]lin is working on getting approval for bathing assistance.

## 2023-11-29 DIAGNOSIS — I63311 Cerebral infarction due to thrombosis of right middle cerebral artery: Secondary | ICD-10-CM | POA: Diagnosis not present

## 2023-11-30 ENCOUNTER — Other Ambulatory Visit: Payer: Self-pay

## 2023-11-30 DIAGNOSIS — I63311 Cerebral infarction due to thrombosis of right middle cerebral artery: Secondary | ICD-10-CM | POA: Diagnosis not present

## 2023-12-01 ENCOUNTER — Ambulatory Visit: Payer: Self-pay

## 2023-12-01 DIAGNOSIS — I63311 Cerebral infarction due to thrombosis of right middle cerebral artery: Secondary | ICD-10-CM | POA: Diagnosis not present

## 2023-12-01 NOTE — Progress Notes (Deleted)
 12/01/2023 Name: Frank Schneider MRN: [id number] DOB: Oct 31, 1975  No chief complaint on file.   Frank Schneider is a 48 y.o. year old male who presented for a telephone visit.   They were referred to the pharmacist by their PCP for assistance in managing diabetes. PMH includes CVA (2016, 2020), PAD, T2DM, tobacco dependence, HLD,    Subjective: Patient was last seen by PCP, [name], NP, on 10/20/23. At last visit, BP was 123/80 mmHg, HR 68. Patient had recently been hospitalized from 08/29/23 to 09/03/23 for SIRS with gastritis (possible gastoenteritis vs gastroparesis due to Ozempic ). He was discharged on a liquid diet and told to hold Ozempic . He has only been taking Lantus  and Novolog  since discharge. His labwork from PCP appt on 10/20/23 was stable, but BG on BMP was 42 mg/dL. He was engaged by pharmacy via telephone on 11/05/23. At this visit, he denied recurrent n/v. He was taking Lantus  and Novolog  as prescribed. He was scheduled for an in-person visit to set up his Freestyle Libre CGM.   Today, patient reports doing ok. No specific complaints at this time. He has not had any recurrent s/sx of nausea or vomiting over the past month.   Due for A1 Due for Lipid panel (fasting?) - add direct LDL?   Care Team: Primary Care Provider: [name], NP ; Next Scheduled Visit: 01/20/24  Medication Access/Adherence  Current Pharmacy:  Frank Schneider - [facility] [address] [place] [address] Phone: [phone] Fax: [phone]  [facility] - [place], [place] - [address] [place] [address] Phone: [phone] Fax: [phone]   Patient reports affordability concerns with their medications: No  Patient reports access/transportation concerns to their pharmacy: No  Patient reports adherence concerns with their medications:  No  - denies missed doses of Lantus  or Novolog . He is a  compliance  packaging patient.   Diabetes:  Current medications: Lantus  (insulin  glargine) 14 units daily, Novolog  (insulin  aspart) 4 units before each meal (typically 2x per day) Medications tried in the past: Ozempic   Denies any recent GI upset like nausea or vomiting.  Current glucose readings:  Using Accu Chek Guide meter; testing every day (fasting) 11/04/23: 220 mg/dL 2/0/74: 854 mg/dL 05/28/72: 819 mg/dL 05/29/72: 880 mg/dL 05/30/72: 883 mg/dL 05/31/72: 80 mg/dL 06/01/72: 883 mg/dL   He does have some Freestyle Libre 3 sensors left at home, but has not been able to connect them to his phone. He was last connected to LibreView on 08/26/23.    Discussed that his last BMP BG was 42 mg/dL, but he did not remember having any specific symptoms at this time.   Patient denies hypoglycemic s/sx including dizziness, shakiness, sweating. Patient denies hyperglycemic symptoms including polyuria, polydipsia, polyphagia, nocturia, neuropathy, blurred vision.  Current meal patterns: Usually eats 2 meals per day.  Hypertension:  Current medications: amlodipine  10 mg daily, carvedilol  12.5 mg BID, lisinopril  40 mg daily  Patient has a validated, automated, upper arm home BP cuff - home health nurse checks once/week.  Current blood pressure readings readings: Does not recall specific readings, but reports it has been normal  Patient denies hypotensive s/sx including dizziness, lightheadedness.  Patient denies hypertensive symptoms including headache, chest pain, shortness of breath   Hyperlipidemia/ASCVD Risk Reduction  Current lipid lowering medications: atorvastatin  40 mg daily  Antiplatelet regimen: aspirin  325 mg daily, cilostazol  100 mg BID (Rx expired 09/03/22)  ASCVD History: stroke, PAD Risk Factors:  tobacco use, known ASCVD, T2DM  Clinical ASCVD: Yes  The ASCVD Risk score (Arnett DK, et al., 2019) failed to calculate for the following reasons:   Risk score cannot be calculated because patient  has a medical history suggesting prior/existing ASCVD    Objective:  BP Readings from Last 3 Encounters:  10/20/23 123/80  09/03/23 (!) 159/72  03/24/23 (!) 149/77    Lab Results  Component Value Date   HGBA1C 8.9 (H) 08/31/2023   HGBA1C 9.0 (H) 08/29/2023   HGBA1C 7.4 (A) 01/07/2023       Latest Ref Rng & Units 10/20/2023    2:56 PM 09/03/2023    5:23 AM 09/02/2023    3:41 AM  BMP  Glucose 70 - 99 mg/dL 42  815  823   BUN 6 - 24 mg/dL 11  9  10    Creatinine 0.76 - 1.27 mg/dL 9.04  9.17  9.18   BUN/Creat Ratio 9 - 20 12     Sodium 134 - 144 mmol/L 142  137  137   Potassium 3.5 - 5.2 mmol/L 4.1  4.1  3.9   Chloride 96 - 106 mmol/L 104  103  103   CO2 20 - 29 mmol/L 22  27  28    Calcium  8.7 - 10.2 mg/dL 9.9  8.5  8.6     Lab Results  Component Value Date   CHOL 88 (L) 06/22/2022   HDL 30 (L) 06/22/2022   LDLCALC 43 06/22/2022   TRIG 380 (H) 08/30/2023   CHOLHDL 2.9 06/22/2022    Medications Reviewed Today   Medications were not reviewed in this encounter       Assessment/Plan:   Diabetes: - Currently uncontrolled with most recent A1C of 8.9% above goal <7%. Medication adherence appears optimal. Patient reported FBG are variable. Hesitant to increase insulin  dose today with some FBG at goal and recent BG on BMP of 42 mg/dL. Would prefer to patient restart wearing CGM to detect whether he is having any hypoglycemia. He is amenable to coming in person for CGM setup. Unclear whether GI symptoms leading to hospitalization in May 2025 were related to GLP-1RA, but given current glycemic control and severity of episode, will not restart at this time.   - Last UACR of 13 mg/g on 06/22/22 - Patient denies personal or family history of multiple endocrine neoplasia type 2, medullary thyroid  cancer; personal history of pancreatitis or gallbladder disease. - Reviewed long term cardiovascular and renal outcomes of uncontrolled blood sugar - Reviewed goal A1c, goal fasting, and goal  2 hour post prandial glucose - Reviewed hypoglycemia management plan and the rule of 15 - Reviewed dietary modifications including  utilizing the healthy plate method, limiting portion size of carbohydrate foods, increasing intake of protein and non-starchy vegetables. Counseled patient to stay hydrated with water throughout the day. - Recommend to continue Lantus  14 units daily and Novolog  4 units before each meal (typically twice daily)  - Recommend to check glucose twice daily: fasting and 2-hr PPG continuously using FL3+ CGM. Educated patient to bring supplies for setup at next appointment. In the meantime, continuing monitoring BG before each meal.  - Next A1C due 12/01/2023    Hypertension: - Currently controlled with last clinic BP below goal less than 130/80. Patient is not having s/sx of hypo- or hyper-tension. Medication adherence appears appropriate.  - Reviewed long term cardiovascular and renal outcomes of uncontrolled blood pressure - Reviewed appropriate blood pressure monitoring technique and reviewed goal blood  pressure. Recommended to check home blood pressure and heart rate once weekly with home health nurse.  - Recommend to continue amlodipine  10 mg daily, carvedilol  12.5 mg BID, lisinopril  40 mg daily    Hyperlipidemia/ASCVD Risk Reduction: - Currently controlled with most recent LDL-C of 43 mg/g below goal < 55 mg/dL given premature and progressive ASCVD. High intensity statin indicated. Noted that recent TG were elevated to 380 mg/dL. Expect improvement with continued adherence to insulin  and improved glycemic control. Noted that prescription for cilostazol  expired in May, which patient was taking for claudication. He does not have any contraindications to continued therapy. Will request that PCP provides refill so this can be included in the patient's next compliance packaging  - Reviewed long term complications of uncontrolled cholesterol - Recommend to continue atorvastatin   40 mg daily.  - Recommend to continue aspirin  325 mg daily - Recommend to restart cilostazol  100 mg BID. Will collaborate with PCP to place orders.    Follow Up Plan:  Pharmacist in person 12/01/23 PCP clinic visit 01/20/24   Lorain Baseman, PharmD [facility] [phone]

## 2023-12-02 ENCOUNTER — Telehealth: Payer: Self-pay

## 2023-12-02 ENCOUNTER — Other Ambulatory Visit: Payer: Self-pay

## 2023-12-02 DIAGNOSIS — I63311 Cerebral infarction due to thrombosis of right middle cerebral artery: Secondary | ICD-10-CM | POA: Diagnosis not present

## 2023-12-02 NOTE — Telephone Encounter (Signed)
 Attempted to contact patient to reschedule in-person appt at [facility] from 12/01/23 for CGM application. Will also reach out via MyChart.   Lorain Baseman, PharmD [facility] [phone]

## 2023-12-03 DIAGNOSIS — I63311 Cerebral infarction due to thrombosis of right middle cerebral artery: Secondary | ICD-10-CM | POA: Diagnosis not present

## 2023-12-04 DIAGNOSIS — I63311 Cerebral infarction due to thrombosis of right middle cerebral artery: Secondary | ICD-10-CM | POA: Diagnosis not present

## 2023-12-05 DIAGNOSIS — I63311 Cerebral infarction due to thrombosis of right middle cerebral artery: Secondary | ICD-10-CM | POA: Diagnosis not present

## 2023-12-06 ENCOUNTER — Telehealth: Payer: Self-pay | Admitting: *Deleted

## 2023-12-06 DIAGNOSIS — I63311 Cerebral infarction due to thrombosis of right middle cerebral artery: Secondary | ICD-10-CM | POA: Diagnosis not present

## 2023-12-06 NOTE — Progress Notes (Signed)
 Complex Care Management Care Guide Note  12/06/2023 Name: Frank Schneider MRN: [id number] DOB: [birth year]  Frank Schneider is a 48 y.o. year old male who is a primary care patient of [name], NP and is actively engaged with the care management team. I reached out to Maryalice Coup by phone today to assist with re-scheduling  with the Pharmacist.  Follow up plan: Unsuccessful telephone outreach attempt made. A HIPAA compliant phone message was left for the patient providing contact information and requesting a return call.  Harlene Satterfield  [facility]  [facility], [facility] Guide  Direct Dial: [phone]  Fax [phone]

## 2023-12-07 DIAGNOSIS — I63311 Cerebral infarction due to thrombosis of right middle cerebral artery: Secondary | ICD-10-CM | POA: Diagnosis not present

## 2023-12-07 NOTE — Progress Notes (Signed)
 Complex Care Management Care Guide Note  12/07/2023 Name: Esaias Cleavenger MRN: [id number] DOB: [birth year]  Alexandro Line is a 48 y.o. year old male who is a primary care patient of [name], NP and is actively engaged with the care management team. I reached out to Maryalice Coup by phone today to assist with re-scheduling  with the Pharmacist.  Follow up plan: Face to Face appointment with complex care management team member scheduled for:  8/26  Harlene Satterfield  [facility]  Value-Based Care Institute, [facility] Guide  Direct Dial: [phone]  Fax [phone]

## 2023-12-08 ENCOUNTER — Other Ambulatory Visit: Payer: Self-pay

## 2023-12-08 DIAGNOSIS — I63311 Cerebral infarction due to thrombosis of right middle cerebral artery: Secondary | ICD-10-CM | POA: Diagnosis not present

## 2023-12-09 DIAGNOSIS — I63311 Cerebral infarction due to thrombosis of right middle cerebral artery: Secondary | ICD-10-CM | POA: Diagnosis not present

## 2023-12-10 DIAGNOSIS — R3981 Functional urinary incontinence: Secondary | ICD-10-CM | POA: Diagnosis not present

## 2023-12-10 DIAGNOSIS — I63311 Cerebral infarction due to thrombosis of right middle cerebral artery: Secondary | ICD-10-CM | POA: Diagnosis not present

## 2023-12-10 DIAGNOSIS — E119 Type 2 diabetes mellitus without complications: Secondary | ICD-10-CM | POA: Diagnosis not present

## 2023-12-11 DIAGNOSIS — I63311 Cerebral infarction due to thrombosis of right middle cerebral artery: Secondary | ICD-10-CM | POA: Diagnosis not present

## 2023-12-12 DIAGNOSIS — I63311 Cerebral infarction due to thrombosis of right middle cerebral artery: Secondary | ICD-10-CM | POA: Diagnosis not present

## 2023-12-13 ENCOUNTER — Other Ambulatory Visit: Payer: Self-pay

## 2023-12-13 ENCOUNTER — Other Ambulatory Visit: Payer: Self-pay | Admitting: Licensed Clinical Social Worker

## 2023-12-13 DIAGNOSIS — I63311 Cerebral infarction due to thrombosis of right middle cerebral artery: Secondary | ICD-10-CM | POA: Diagnosis not present

## 2023-12-13 NOTE — Patient Outreach (Signed)
 Complex Care Management   Visit Note  12/13/2023  Name:  Frank Schneider MRN: [id number] DOB: October 15, 1975  Situation: Referral received for Complex Care Management related to SDOH needs: housing, finances  I obtained verbal consent from Patient.  Visit completed with patient  on the phone  Background:   Past Medical History:  Diagnosis Date   Anticoagulated    ASA 325 mg--- ;managed by pcp  (stroke prevention)   Beta thalassemia trait    hematology/ oncologist--- dr lanny (lov in epic 11-11-2022  elevated A2   Chronic foot pain    Diabetic neuropathy (HCC)    DOE (dyspnea on exertion)    02-23-2023  per pt sob w/ stairs , yard work, and ok with household chores,  does not do any time of walking or exercise   History of cerebrovascular accident (CVA) with residual deficit 12/31/2014   admission in epic;   right thalamic infarct second SVD residual LUE / LLE numb/ ting with sensory deficit   History of eye trauma    02-23-2023  per pt eye injury age late 65s , never seek medical attention,  vision became progressive worse over time;  then pt had stroke 01/ 2020 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,  s/p eye removal 02/ 2024   History of ischemic stroke 05/09/2015   admission in epic;   acute ischemia right ventral pons,  pt was taking his plavix    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 gretta;  taking  pletal  twice daily   Leucocytosis    Numbness and tingling of left arm and leg    stroke residual,  sensory deficit resolve   Type 2 diabetes mellitus (HCC)    followed by pcp;   (02-23-2023  per pt has Libre 3,  blood sugar check multiple times daily,  stated average fasting sugar 120s,  stopped doing lantus  daily approx 09/ 2024,  only taking jardiance  daily and weely ozempic )   Vision abnormalities    Vitamin D  deficiency 12/2019   Wears glasses     Assessment: Patient Reported Symptoms:  Cognitive Cognitive Status: Alert and oriented to person, place, and time, Difficulties with attention and concentration Cognitive/Intellectual Conditions Management [RPT]: None reported or documented in medical history or problem list   Health Maintenance Behaviors: Stress management, Annual physical exam  Neurological Neurological Review of Symptoms: Headaches, Dizziness, Weakness Neurological Management Strategies: Coping strategies  HEENT HEENT Symptoms Reported: No symptoms reported HEENT Management Strategies: Coping strategies    Cardiovascular Cardiovascular Symptoms Reported: Dizziness, Fatigue Does patient have uncontrolled Hypertension?: No Cardiovascular Management Strategies: Coping strategies  Respiratory Respiratory Symptoms Reported: Shortness of breath, Wheezing Additional Respiratory Details: concerned about mold issues in home Respiratory Management Strategies: Coping strategies  Endocrine Endocrine Symptoms Reported: Weakness or fatigue, Blurry vision, Increased urination    Gastrointestinal Gastrointestinal Symptoms Reported: Incontinence (wears pull up frequently (3X weekly)) Gastrointestinal Management  Strategies: Coping strategies    Genitourinary Genitourinary Symptoms Reported: Frequency Genitourinary Management Strategies: Coping strategies  Integumentary Integumentary Symptoms Reported: No symptoms reported Skin Management Strategies: Coping strategies   Musculoskeletal Musculoskelatal Symptoms Reviewed: Back pain, Difficulty walking, Muscle pain, Unsteady gait, Weakness Musculoskeletal Management Strategies: Coping strategies, Routine screening      Psychosocial Psychosocial Symptoms Reported: Anxiety - if selected complete GAD, Depression - if selected complete PHQ 2-9, Sadness - if selected complete PHQ 2-9 Behavioral Management Strategies: Coping strategies, Counseling Major Change/Loss/Stressor/Fears (CP): Medical condition, self Techniques to Cope with Loss/Stress/Change: Counseling Quality of Family Relationships: unable to assess Do you feel physically threatened by others?: No      12/13/2023   10:08 AM  Depression screen PHQ 2/9  Decreased Interest 1  Down, Depressed, Hopeless 1  PHQ - 2 Score 2  Altered sleeping 1  Tired, decreased energy 1  Change in appetite 1  Feeling bad or failure about yourself  1  Trouble concentrating 1  Moving slowly or fidgety/restless 1  Suicidal thoughts 0  PHQ-9 Score 8  Difficult doing work/chores Somewhat difficult    Vitals:   BP may be running high occasionally for client, per client information   Medications Reviewed Today     Reviewed by Frances Ozell GORMAN KEN (Social Worker) on 12/13/23 at [phone]  Med List Status: <None>   Medication Order Taking? Sig Documenting Provider Last Dose Status Informant  Accu-Chek Softclix Lancets lancets [id number] Unknown  Use in the morning and at bedtime Nichols, Tonya S, NP  Active Self, Pharmacy Records  acetaminophen  (TYLENOL ) 500 MG tablet [id number] Not taking  Take 2 tablets (Total= 1,000 mg), by mouth, 2 times a day as needed.  Patient not taking: Reported on 12/13/2023   Jegede, Olugbemiga E, MD  Active Self, Pharmacy Records  amLODipine  (NORVASC ) 10 MG tablet [id number] Yes Take 1 tablet (10 mg total) by mouth daily. Oley Bascom GORMAN, NP  Active   aspirin  325 MG tablet [id number] Yes Take 1 tablet (325 mg total) by mouth daily. Oley Bascom GORMAN, NP  Active Self, Pharmacy Records  atorvastatin  (LIPITOR) 40 MG tablet [id number] Yes Take 1 tablet (40 mg total) by mouth daily. Oley Bascom GORMAN, NP  Active Self, Pharmacy Records  benzonatate  (TESSALON ) 200 MG capsule [id number] Unknown  Take 1 capsule (200 mg total) by mouth 3 (three) times daily as needed for cough. Rai, Nydia POUR, MD  Active   Blood Glucose Monitoring Suppl (ACCU-CHEK GUIDE) w/Device KIT [id number] Unknown  Use to test blood sugar 3 times daily  Patient not taking: Reported on 12/13/2023   Rai, Nydia POUR, MD  Active   carvedilol  (COREG ) 12.5 MG tablet [id number] Yes Take 1 tablet (12.5 mg total) by mouth 2 (two) times daily with a meal. Rai, Ripudeep K, MD  Active   cilostazol  (PLETAL ) 100 MG tablet [id number] Yes Take 1 tablet (100 mg total) by mouth 2 (two) times daily before a meal. Oley Bascom GORMAN, NP  Active   Continuous Glucose Sensor (FREESTYLE LIBRE 3 SENSOR) [place] [id number] Yes Place 1 sensor on the skin every 14 days. Use to check glucose continuously Paseda, Folashade R, FNP  Active Self, Pharmacy Records  dicyclomine  (BENTYL ) 20 MG tablet [id number] Unknown  Take 1 tablet (20 mg total) by mouth 2 (two) times daily as needed for spasms. Small, Lyle CROME, PA  Active Self, Pharmacy Records  food thickener ([address] HONEY/LEVEL 3/MODERATELY [place],) LIQD [id number] Unknown  Take 10 packets by mouth as  needed. Rai, Nydia POUR, MD  Active   Glucose Blood (BLOOD GLUCOSE TEST STRIPS) STRP [id number] Unknown  Use 3 (three) times daily as directed to check blood sugar. Davia Nydia POUR, MD  Active     Discontinued 06/22/22 1418 guaiFENesin -dextromethorphan  (ROBITUSSIN DM) 100-10 MG/5ML syrup [id number] Not taking  Take 5 mLs by mouth every 4 (four) hours as needed for cough.  Patient not taking: Reported on 11/03/2023   Davia Nydia POUR, MD  Active   insulin  aspart (NOVOLOG ) 100 UNIT/ML FlexPen [id number] Yes Inject 4 Units into the skin 3 (three) times daily with meals. Only  take if eating a meal AND Blood Glucose (BG) is 80 or higher. Rai, Nydia POUR, MD  Active   insulin  glargine (LANTUS  SOLOSTAR) 100 UNIT/ML Solostar Pen [id number] Yes Inject 12 Units into the skin at bedtime. Rai, Ripudeep POUR, MD  Active   Insulin  Pen Needle (PEN NEEDLES) 31G X 5 MM MISC [id number] Yes Use 3 times daily as directed Rai, Ripudeep K, MD  Active   Lancets MISC [id number] Yes Use 3 (three) times daily as directed to check blood sugar. Rai, Nydia POUR, MD  Active   Lancets Misc. (ACCU-CHEK SOFTCLIX LANCET DEV) KIT [id number] Yes Use 3 times daily Rai, Ripudeep K, MD  Active   lisinopril  (ZESTRIL ) 40 MG tablet [id number] Yes Take 1 tablet (40 mg total) by mouth daily. Rai, Nydia POUR, MD  Active   loperamide  (IMODIUM  A-D) 2 MG tablet [id number] Unknown  Take 1 tablet (2 mg total) by mouth 4 (four) times daily as needed for diarrhea or loose stools. [name], NP  Active Self, Pharmacy Records  ondansetron  (ZOFRAN -ODT) 4 MG disintegrating tablet [id number] Not taking  Take 1 tablet (4 mg total) by mouth every 8 (eight) hours as needed for nausea or vomiting.  Patient not taking: Reported on 12/13/2023   Davia Nydia POUR, MD  Active   pantoprazole  (PROTONIX ) 40 MG tablet [id number] Yes Take 1 tablet (40 mg total) by mouth daily at 12 noon. Davia Nydia POUR, MD  Active            Recommendation:   PCP Follow-up Continue Current Plan of Care Attend appointment this week with rep to discuss electric wheelchair for client  Call [facility] program through [facility] to inquire about resources Call [employer] rep to inquire about vision support services Call LCSW as needed for SW support  Follow Up Plan:    Telephone follow up appointment date/time:  01/18/24 at 2:00 PM    Glendia Pear  MSW, LCSW [place]/Value Based Care [facility] Licensed Clinical Social Worker Direct Dial:  [phone] Fax:  [phone] Website:  [email or web address]

## 2023-12-13 NOTE — Patient Instructions (Signed)
 Visit Information  Thank you for taking time to visit with me today. Please don't hesitate to contact me if I can be of assistance to you before our next scheduled appointment.  Our next appointment is by telephone on 01/18/24 at 2:00 PM   Please call the care guide team at [phone] if you need to cancel or reschedule your appointment.   Following is a copy of your care plan:   Goals Addressed             This Visit's Progress    VBCI Social Work Care Plan       Problems:   Transport needs occasionally              Pain issues             Food scarcity issues             Decreased family support             Walking challenges ;has to take rest breaks when walking              ADLs completion challenges              Housing needs              Depression issues  CSW Clinical Goal(s):    Over the next 30 days the Patient will attend all scheduled medical appointments as evidenced by patient report and care team review of appointment completion in electronic MEDICAL RECORD NUMBER .            Over the next 30 days patient will research community resources of help to client  AEB patient contacting several community agencies for possible help for client   Interventions:  Discussed medication procurement for client              Discussed ADLs completion. Client said it takes him longer to complete ADLs             Client said he has a home health aide that helps him daily for 2 hours daily in the home              Discussed mobility challenges.He takes rest breaks when walking               Client said he had talked with [facility] worker about [employer] support. He said he plans to call [employer] to inquire about possible support.             Discussed Stroke Warriers program through [facility] and encouraged client to contact that program through [facility] . He said he would contact Stroke Warriers program at [facility]             Encouraged client  to contact [facility] in [place] to seek help with food resources (they have community garden program for food support). Client said he has food pantry that provides some food support for him              Encouraged client to call NP [name] to talk with NP about mobility issues of client and about client request for motorized scooter . He said he has appointment later this week to talk with representative about electric wheelchair for himself               Provided counseling support for client  Discussed pain issues of client.                Discussed podiatry care of client. He sees podiatrist as scheduled.                 Discussed mood issues of client. He is sad sometimes thinking about his current housing situation and trying to plan for upcoming housing needs. He may get sad related to financial needs.               Discussed transport needs. He schedules transport help through his insurance provider.               Encouraged client to call LCSW as needed for SW support at [phone]                 Patient Goals/Self-Care Activities:  Take medications as prescribed              Attend scheduled medical appointments as needed              Allow time for ADLs completion              Call LCSW as needed for SW support at [phone]              Contact [employer] related to food support               Contact Stroke Warriers program at [facility] to learn more about possible program support              Contact rep for [employer] to inquire about vision support help possibly through that agency        Plan:   Telephone follow up appointment with care management team member scheduled for:  01/18/24 at 2:00 PM         Please go to [facility] [address], [place] [phone]) if you are experiencing a Mental Health or Behavioral Health Crisis or need someone to talk to.  The  patient verbalized understanding of instructions, educational materials, and care plan provided today and DECLINED offer to receive copy of patient instructions, educational materials, and care plan.    Glendia Pear  MSW, LCSW [facility] Licensed Clinical Social Worker Direct Dial:  [phone] Fax:  [phone] Website:  [email or web address]

## 2023-12-14 DIAGNOSIS — I63311 Cerebral infarction due to thrombosis of right middle cerebral artery: Secondary | ICD-10-CM | POA: Diagnosis not present

## 2023-12-15 DIAGNOSIS — I63311 Cerebral infarction due to thrombosis of right middle cerebral artery: Secondary | ICD-10-CM | POA: Diagnosis not present

## 2023-12-16 DIAGNOSIS — I63311 Cerebral infarction due to thrombosis of right middle cerebral artery: Secondary | ICD-10-CM | POA: Diagnosis not present

## 2023-12-17 ENCOUNTER — Ambulatory Visit: Attending: Nurse Practitioner

## 2023-12-17 DIAGNOSIS — I63311 Cerebral infarction due to thrombosis of right middle cerebral artery: Secondary | ICD-10-CM | POA: Diagnosis not present

## 2023-12-17 NOTE — Therapy (Deleted)
 OUTPATIENT PHYSICAL THERAPY WHEELCHAIR EVALUATION   Patient Name: Frank Schneider MRN: [id number] DOB:1975/10/06, 48 y.o., male Today's Date: 12/17/2023  END OF SESSION:   Past Medical History:  Diagnosis Date   Anticoagulated    ASA 325 mg--- ;managed by pcp  (stroke prevention)   Beta thalassemia trait    hematology/ oncologist--- dr lanny (arnetta in epic 11-11-2022  elevated A2   Chronic foot pain    Diabetic neuropathy (HCC)    DOE (dyspnea on exertion)    02-23-2023  per pt sob w/ stairs , yard work, and ok with household chores,  does not do any time of walking or exercise   History of cerebrovascular accident (CVA) with residual deficit 12/31/2014   admission in epic;   right thalamic infarct second SVD residual LUE / LLE numb/ ting with sensory deficit   History of eye trauma    02-23-2023  per pt eye injury age late 45s , never seek medical attention,  vision became progressive worse over time;  then pt had stroke 01/ 2020 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,  s/p eye removal 02/ 2024   History of ischemic stroke 05/09/2015   admission in epic;   acute ischemia right ventral pons,  pt was taking his plavix    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 gretta;  taking pletal  twice daily   Leucocytosis    Numbness and tingling of left arm and leg    stroke residual,  sensory deficit resolve    Type 2 diabetes mellitus (HCC)    followed by pcp;   (02-23-2023  per pt has Libre 3,  blood sugar check multiple times daily,  stated average fasting sugar 120s,  stopped doing lantus  daily approx 09/ 2024,  only taking jardiance  daily and weely ozempic )   Vision abnormalities    Vitamin D  deficiency 12/2019   Wears glasses    Past Surgical History:  Procedure Laterality Date   CATARACT EXTRACTION W/ INTRAOCULAR LENS IMPLANT Right 05/27/2016   EVISCERATION Right 06/11/2022   Procedure: EVISCERATION REPAIR WITH POSSIBLE INCLUSION;  Surgeon: Rodgers Faden, MD;  Location: [facility] OR;  Service: Ophthalmology;  Laterality: Right;   INCISION AND DRAINAGE PERIRECTAL ABSCESS  07/28/2011   Procedure: IRRIGATION AND DEBRIDEMENT PERIRECTAL ABSCESS;  Surgeon: Elon CHRISTELLA Pacini, MD;  Location: [facility] ORS;  Service: General;  Laterality: N/A;   of skin muscle and subcutaneous tissue of perimeum  8cmx12cm area    IR GENERIC HISTORICAL  01/08/2016   IR RADIOLOGIST EVAL & MGMT 01/08/2016 [facility] INTERV RAD   LAPAROSCOPIC APPENDECTOMY N/A 12/16/2015   Procedure: APPENDECTOMY LAPAROSCOPIC;  Surgeon: Vicenta Poli, MD;  Location: [facility] OR[facility];  Service: General;  Laterality: N/A;   PRIAPISM REPAIR     age 66s   (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)   Patient Active Problem List   Diagnosis  Date Noted   Lactic acidosis 08/29/2023   Hypertensive urgency 08/29/2023   Abdominal pain 08/29/2023   Nausea and vomiting 08/29/2023   Atherosclerosis of native arteries of extremity with intermittent claudication (HCC) 03/24/2022   Type 2 diabetes mellitus with hyperglycemia, without long-term current use of insulin  (HCC) 01/30/2022   Vitamin D  deficiency 09/15/2021   Anxiety and depression 09/24/2019   Chronic left flank pain 09/24/2019   Dysphagia 04/30/2018   Ischemic stroke (HCC) 04/29/2018   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   Acute appendicitis 12/16/2015   Right cataract  10/04/2015   Tobacco dependence 10/04/2015   Absolute anemia 08/07/2015   Neuropathy 08/07/2015   Cerebrovascular accident (CVA) (HCC)    Leukocytosis    Stroke (cerebrum) (HCC) 05/09/2015   Type 2 diabetes mellitus with circulatory disorder (HCC) 03/12/2015   Hyperlipidemia 02/14/2015   History of CVA (cerebrovascular accident) 02/14/2015   CVA (cerebral vascular accident) (HCC) 01/01/2015   Diabetes mellitus type 2 with complications, uncontrolled 01/01/2015   Stroke (HCC)    Essential hypertension    Smoker    Tobacco use     PCP: ***  REFERRING PROVIDER: ***  THERAPY DIAG:  No diagnosis found.  Rationale for Evaluation and Treatment {HABREHAB:27488}  SUBJECTIVE:                                                                                                                                                                                           SUBJECTIVE STATEMENT: Pt presents for wheelchair evaluation. ***  PRECAUTIONS: {Therapy precautions:24002}  RED FLAGS: {PT Red Flags:29287}  WEIGHT BEARING RESTRICTIONS {Yes ***/No:24003}    OCCUPATION: ***  PLOF:  {PLOF:24004}  PATIENT GOALS: ***         MEDICAL HISTORY:  Primary diagnosis onset: ***     Medical Diagnosis with ICD-10 code: ***   [] Progressive disease  Relevant future surgeries:     Height:  Weight:  Explain recent changes or trends in weight:      History:  Past Medical History:  Diagnosis Date   Anticoagulated    ASA 325 mg--- ;managed by pcp  (stroke prevention)   Beta thalassemia trait    hematology/ oncologist--- dr lanny (lov in epic 11-11-2022  elevated A2   Chronic foot pain    Diabetic neuropathy (HCC)    DOE (dyspnea on exertion)    02-23-2023  per pt sob w/ stairs , yard work, and ok with household chores,  does not do any time of walking or exercise   History of cerebrovascular accident (CVA) with residual deficit 12/31/2014   admission in epic;   right thalamic infarct  second SVD residual LUE / LLE numb/ ting with sensory deficit   History of eye trauma    02-23-2023  per pt eye injury age late 64s , never seek medical attention,  vision became progressive worse over time;  then pt had stroke 01/ 2020 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,  s/p eye removal 02/ 2024   History of ischemic stroke 05/09/2015   admission in epic;   acute ischemia right ventral pons,  pt was taking his plavix    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 gretta;  taking pletal  twice daily   Leucocytosis    Numbness and tingling of left arm and leg    stroke residual,  sensory deficit resolve   Type 2 diabetes mellitus (HCC)    followed by pcp;   (02-23-2023  per pt has Libre 3,  blood sugar check multiple times daily,  stated average fasting sugar 120s,  stopped doing lantus  daily approx 09/ 2024,  only taking jardiance  daily and weely ozempic )   Vision abnormalities    Vitamin D  deficiency 12/2019   Wears glasses        Cardio Status:  Functional Limitations:   [] Intact  []  Impaired      Respiratory Status:  Functional Limitations:   [] Intact  [] Impaired   [] SOB [] COPD [] O2 Dependent ______LPM  [] Ventilator Dependent  Resp equip:                                                     Objective Measure(s):   Orthotics:   [] Amputee:                                                              [] Prosthesis:        HOME ENVIRONMENT:  [] House [] Condo/town home [] Apartment [] Asst living [] LTCF         [] Own  [] Rent   [] Lives alone [] Lives with others -                             Hours without assistance:   [] Home is accessible to patient                                 Storage of wheelchair:  [] In home   [] Other Comments:        COMMUNITY :  TRANSPORTATION:  [] Car [] Holiday representative Adapted w/c Lift []  Ambulance [] Other:                     []   Sits in wheelchair during transport   Where is w/c stored during transport?  [] [place] [place]  []  [employer]  r   [] Self-Driver       Drive while in  Biomedical scientist [] yes [] no   Employment and/or school:  Specific requirements pertaining to mobility        Other:  COMMUNICATION:  Verbal Communication  [] WFL [] receptive [] WFL [] expressive [] Understandable  [] Difficult to understand  [] non-communicative  Primary Language:______________ 2nd:_____________  Communication provided by:[] Patient [] Family [] Caregiver [] Translator   [] Uses an augmentative communication device     Manufacturer/Model :                                                                MOBILITY/BALANCE:  Sitting Balance  Standing Balance  Transfers  Ambulation   [] WFL      [] WFL  [] Independent  []  Independent   [] Uses UE for balance in sitting Comments:  [] Uses UE/device for stability Comments:  []  Min assist  []  Ambulates independently with       device:___________________      []  Mod assist  []  Able to ambulate ______ feet        safely/functionally/independently   []  Min assist  []  Min assist  []  Max assist  []  Non-functional ambulator         History/High risk of falls   []  Mod assist  []  Mod assist  []  Dependent  []  Unable to ambulate   []  Max  assist  []  Max assist  Transfer method:[] 1 person [] 2 person [] sliding board [] squat pivot [] stand pivot [] mechanical patient lift  [] other:   []  Unable  []  Unable    Fall History: # of falls in the past 6  months? *** # of "near" falls in the past 6 months? ***    CURRENT SEATING / MOBILITY:  Current Mobility Device: [] None [] Cane/Walker [] Manual [] Dependent [] Dependent w/ Tilt rScooter  [] Power (type of control):   Manufacturer:  Model:  Serial #:   Size:  Color:  Age:   Purchased by whom:   Current condition of mobility base:    Current seating system:                                                                       Age of seating system:    Describe posture in present seating system:    Is the current mobility meeting medical necessity?:  [] Yes [] No Describe:                                     Ability to complete Mobility-Related Activities of Daily Living (MRADL's) with Current Mobility Device:   Move room to room  [] Independent  [] Min [] Mod [] Max assist  [] Unable  Comments:   Meal prep  [] Independent  [] Min [] Mod [] Max assist  [] Unable    Feeding  [] Independent  [] Min [] Mod [] Max assist  [] Unable    Bathing  [] Independent  [] Min [] Mod []   Max assist  [] Unable    Grooming  [] Independent  [] Min [] Mod [] Max assist  [] Unable    UE dressing  [] Independent  [] Min [] Mod [] Max assist  [] Unable    LE dressing  [] Independent   [] Min [] Mod [] Max assist  [] Unable    Toileting  [] Independent  [] Min [] Mod [] Max assist  [] Unable    Bowel Mgt: []  Continent []  Incontinent []  Accidents []  Diapers []  Colostomy []  Bowel Program:  Bladder Mgt: []  Continent []  Incontinent []  Accidents []  Diapers []  Urinal []  Intermittent Cath []  Indwelling Cath []  Supra-pubic Cath     Current Mobility Equipment Trialed/ Ruled Out:    Does not meet mobility needs due to:    Mark all boxes that indicate inability to use the specific equipment listed     Meets needs for safe  independent functional  ambulation  / mobility    Risk of  Falling or History of Falls    Enviromental limitations      Cognition    Safety concerns with  physical ability    Decreased / limitations endurance  & strength      Decreased / limitations  motor skills  & coordination    Pain    Pace /  Speed    Cardiac and/or  respiratory condition    Contra - indicated by diagnosis   Cane/Crutches  []   []   []   []   []   []   []   []   []   []   []    Walker / Rollator  []  NA   []   []   []   []   []   []   []   []   []   []   []     Manual Wheelchair [id number]:  []  NA  []   []   []   []   []   []   []   []   []   []   []    Manual W/C (K0005) with power assist  []  NA  []   []   []   []   []   []   []   []   []   []   []    Scooter  []  NA  []   []   []   []   []   []   []   []   []   []   []    Power Wheelchair: standard joystick  []  NA  []   []   []   []   []   []   []   []   []   []   []    Power Wheelchair: alternative controls  []  NA  []   []   []   []   []   []   []   []   []   []   []    Summary:  The least costly alternative for independent functional mobility was found to be:    []  Crutch/Cane  []  Walker []  Manual w/c  []  Manual w/c with power assist   []  Scooter   []  Power w/c std joystick   []  Power w/c alternative control        []  Requires dependent care mobility Market researcher for [employer] skills are adequate for safe mobility equipment operation  []   Yes []   No  Patient is willing and motivated to use recommended mobility equipment  []   Yes []   No       []  Patient is unable to safely operate mobility equipment independently and requires dependent care equipment Comments:           SENSATION and SKIN ISSUES:  Sensation []  Intact  []  Impaired []  Absent []  Hyposensate []  Hypersensate  []   Defensiveness  Location(s) of impairment:    Pressure Relief Method(s):  []  Lean side to side to offload (without risk of falling)  []   W/C push up (4+ times/hour for 15+ seconds) []  Stand up (without risk of falling)    []  Other: (Describe): Effective pressure relief method(s) above can be performed consistently throughout the day: [] Yes  []  No If not, Why?:  Skin Integrity Risk:       []  Low risk           []  Moderate risk             []  High risk  If high risk, explain:   Skin Issues/Skin Integrity  Current skin Issues  []  Yes []  No []  Intact  []   Red area   []   Open area  []  Scar tissue  []  At risk from prolonged sitting  Where: History of Skin Issues  []  Yes []  No Where : When: Stage: Hx of skin flap surgeries  []  Yes []  No Where:  When:  Pain: []  Yes []  No   Pain Location(s):  Intensity scale: (0-10) : How does pain interfere with mobility and/or MRADLs? -         MAT EVALUATION:  Neuro-Muscular Status: (Tone, Reflexive, Responses, etc.)     []   Intact   []  Spasticity:  []  Hypotonicity  []  Fluctuating  []  Muscle Spasms  []  Poor Righting Reactions/Poor Equilibrium Reactions  []  Primal Reflex(s):    Comments:            COMMENTS:    POSTURE:     Comments:  Pelvis Anterior/Posterior:  []  Neutral   []  Posterior  []  Anterior  []  Fixed - No movement []  Tendency away from neutral []  Flexible []  Self-correction []  External correction Obliquity (viewed from front)  []  WFL []  R Obliquity []  L Obliquity  []  Fixed - No movement []  Tendency away from neutral []  Flexible []  Self-correction []  External correction Rotation  []  WFL []  R anterior []  L anterior  []  Fixed - No movement []  Tendency away from neutral []  Flexible []  Self-correction []  External correction Tonal Influence Pelvis:  []  Normal []  Flaccid []  Low tone []  Spasticity []  Dystonia []  Pelvis thrust []  Other:    Trunk Anterior/Posterior:  []  WFL []  Thoracic kyphosis []  Lumbar lordosis  []  Fixed - No movement []  Tendency away from neutral []  Flexible []  Self-correction []  External correction  []  WFL []  Convex to left  []  Convex to right []  S-curve   []  C-curve []  Multiple curves []  Tendency away from neutral []  Flexible []  Self-correction []  External correction Rotation of shoulders and upper trunk:  []  Neutral []  Left-anterior []  Right- anterior []  Fixed- no movement []  Tendency away  from neutral []  Flexible []  Self correction []  External correction Tonal influence Trunk:  []  Normal []  Flaccid []  Low tone []  Spasticity []  Dystonia []  Other:   Head & Neck  []  Functional []  Flexed    []  Extended []  Rotated right  []  Rotated left []  Laterally flexed right []  Laterally flexed left []  Cervical hyperextension   []  Good head control []  Adequate head control []  Limited head control []  Absent head control Describe tone/movement of head and neck:      Lower Extremity Measurements: LE ROM:  {AROM/PROM:27142} ROM Right 12/17/2023 Left 12/17/2023  Hip flexion    Hip extension    Hip abduction    Hip adduction    Knee flexion  Knee extension    Ankle dorsiflexion    Ankle plantarflexion     (Blank rows = not tested)  LE MMT:  MMT Right 12/17/2023 Left 12/17/2023  Hip flexion    Hip extension    Hip abduction    Hip adduction    Knee flexion    Knee extension    Ankle dorsiflexion    Ankle plantarflexion     (Blank rows = not tested)  Hip positions:  []  Neutral   []  Abducted   []  Adducted  []  Subluxed   []  Dislocated   []  Fixed   []  Tendency away from neutral []  Flexible []  Self-correction []  External correction   Hip Windswept:[]  Neutral  []  Right    []  Left  []  Subluxed   []  Dislocated   []  Fixed   []  Tendency away from neutral []  Flexible []  Self-correction []  External correction  LE Tone: []  Normal []  Low tone []  Spasticity []  Flaccid []  Dystonia []  Rocks/Extends at hip []  Thrust into knee extension []  Pushes legs downward into footrest  Foot positioning: ROM Concerns: Dorsiflexed: []  Right   []  Left Plantar flexed: []  Right    []  Left Inversion: []  Right    []  Left Eversion: []  Right    []  Left  LE Edema: []  1+ (Barely detectable impression when finger is pressed into skin) []  2+ (slight indentation. 15 seconds to rebound) []  3+ (deeper indentation. 30 seconds to rebound) []  4+ (>30 seconds to rebound)  UE  Measurements:  UPPER EXTREMITY ROM:   {AROM/PROM:27142} ROM Right 12/17/2023 Left 12/17/2023  Shoulder flexion    Shoulder abduction    Shoulder adduction    Elbow flexion    Elbow extension    Wrist flexion    Wrist extension    (Blank rows = not tested)  UPPER EXTREMITY MMT:  MMT Right 12/17/2023 Left 12/17/2023  Shoulder flexion    Shoulder abduction    Shoulder adduction    Elbow flexion    Elbow extension    Wrist flexion    Wrist extension    Pinch strength    Grip strength    (Blank rows = not tested)  Shoulder Posture:  Right Tendency towards Left  []   Functional []    []   Elevation []    []   Depression []    []   Protraction []    []   Retraction []    []   Internal rotation []    []   External rotation []    []   Subluxed []     UE Tone: []  Normal []  Flaccid []  Low tone []  Spasticity  []  Dystonia []  Other:   UE Edema: []  1+ (Barely detectable impression when finger is pressed into skin) []  2+ (slight indentation. 15 seconds to rebound) []  3+ (deeper indentation. 30 seconds to rebound) []  4+ (>30 seconds to rebound)  Wrist/Hand: Handedness: []  Right   []  Left   []  NA: Comments:  Right  Left  []   WNL []    []   Limitations []    []   Contractures []    []   Fisting []    []   Tremors []    []   Weak grasp []    []   Poor dexterity []    []   Hand movement non functional []    []   Paralysis []         MOBILITY BASE RECOMMENDATIONS and JUSTIFICATION:  MOBILITY BASE  JUSTIFICATION   Manufacturer:    Model:  Color:  Seat Width:   Seat Depth    []  Manual mobility base (continue below)   []  Scooter/POV  []  Power mobility base   Number of hours per day spent in above selected mobility base:   Typical daily mobility base use Schedule:    []  is not a safe, functional ambulator  []  limitation prevents from completing a MRADL(s) within a reasonable time frame    []  limitation places at high risk of morbidity or mortality secondary to   the attempts to perform a    MRADL(s)  []  limitation prevents accomplishing a MRADL(s) entirely  []  provide independent mobility  []  equipment is a lifetime medical need  []  walker or cane inadequate  []  any type manual wheelchair      inadequate  []  scooter/POV inadequate      []  requires dependent mobility          MANUAL MOBILITY      []  Standard manual wheelchair  K0001      Arm:    []  both []  right  []  left      Foot:   []  both []  right   []  left  []  self-propels wheelchair  []  will use on regular basis  []  chair fits throughout home  []  willing and motivated to use  []  propels with assistance     []  dependent use   []  Standard hemi-manual wheelchair  K0002      Arm:    []  both []  right  []  left      Foot:   []  both []  right   []  left  []  lower seat height required to foot propel  []  short stature  []  self-propels wheelchair  []  will use on regular basis  []  chair fits throughout home  []  willing and motivated to use   []  propels with assistance  []  dependent use   []  Lightweight manual wheelchair  K0003      Arm:    []  both []  right  []  left      Foot:   []  both  []  right  []  left                   []  hemi height required  []  medical condition and weight of  wheelchair affect ability to self      propel standard manual wheelchair in the residence  []  can and does self-propel (marginal propulsion skills)  []  daily use _________hours  []  chair fits throughout home  []  willing and motivated to use  []  lower seat height required to foot propel  []  short stature   []  High strength lightweight manual  wheelchair (Breezy Ultra 4)  K0004     Arm:    []  both []  right  []  left     Foot:   []  both []  right   []  left                                                                  []  hemi height required []  medical condition and weight of wheelchair affect ability to self propel while engaging in frequent MRADL(s) that cannot be performed in a standard or lightweight manual  wheelchair  []  daily use _________hours  []   chair fits throughout home  []  willing and motivated to use  []  prevent repetitive use injuries   []  lower seat height required to foot propel  []  short stature    []  Ultra-lightweight manual wheelchair  K0005     Arm:    []  both []  right  []  left     Foot:   []  both []  right  []  left       []  hemi height required  []  heavy duty    Front seat to floor _____ inches      Rear seat to floor _____ inches      Back height _____ inches     Back angle ______ degrees      Front angle _____ degrees  []   full-time manual wheelchair user  []  Requires individualized fitting and optimal adjustments for multiple features that include adjustable axle configuration, fully adjustable center of gravity, wheel camber, seat and back angle, angle of seat slope, which cannot be accommodated by a K0001 through K0004 manual wheelchair  []  prevent repetitive use injuries  []  daily use_________hours   []  user has high activity patterns that frequently require  them  to go out into the community for the purpose of independently accomplishing high level MRADL activities. Examples of these might include a combination of; shopping, work, school, Photographer, childcare, independently loading and unloading from a vehicle etc.  []  lower seat height required to foot propel  []  short stature  []  heavy duty -  weight over 250lbs   []  Current chair is a K0005   manufacture:___________________  model:_________________  serial#____________________  age:_________    []  First time X9994 user (complete trial)  K0004 time and # of strokes to propel 30 feet: ________seconds _________strokes  X9994 time and # of strokes to propel 30 feet: ________seconds _________strokes  What was the result of the trial between the K0004 and K0005 manual wheelchair? ___    What features of the K0005 w/c are needed as compared to the K0004 base? Why?___    []  adjustable seat and back angle  changes the angle of seat slope of the frame to attain a gravity assisted position for efficient propulsion and proper weight distribution along the frame     []  the front of the wheelchair will be configured higher than the back of the chair to allow gravity to assist the user with postural stability  []  the center of the wheel will be positioned for stability, safety and efficient propulsion  []  adjustable axle allows for vertical, horizontal, camber and overall width changes  throughout the wheels for adjustment of the client's exact needs and abilities.   []  adjustable axle increases the stability and function of the chair allowing for adjustment of the center of gravity.   []  accommodates the client's anatomical position in the chair maximizing independence in mobility and maneuverability in all environments.   []  create a minimal fixed tilt-in space to assist in positioning.   []  Describe users full-time manual wheelchair activity patterns:___    []  Power assist Comments:  []  prevent repetitive use injuries  []  repetitive strain injury present in    shoulder girdle    []  shoulder pain is (> or =) to 7/10     during manual propulsion       Current Pain _____/10  []  requires conservation of energy to participate in MRADL(s) runable to propel up ramps or curbs using manual wheelchair  []  been K0005 user greater than one  year  []  user unwilling to use power      wheelchair (reason): []  less expensive option to power   wheelchair   []  rim activated power assist -      decreased strength   []  Heavy duty manual wheelchair       K0006     Arm:    []  both []  right  []  left     Foot:   []  both []  right  []  left     []  hemi height required    []  Dependent base  []  user exceeds 250lbs  []  non-functional ambulator    []  extreme spasticity  []  over active movement   []  broken frame/hx of repeated     repairs  []  able to self-propel in residence       []  lower seat to floor height required   []  unable to self-propel in residence   []  Extra heavy duty manual wheelchair  K0007     Arm:    []  both []  right  []  left     Foot:   []  both []  right  []  left     []  hemi height required  []  Dependent base  []  user exceeds 300lbs  []  non-functional ambulator    []  able to self-propel in residence   []  lower seat to floor height required  []  unable to self-propel in residence     []  Manual wheelchair with tilt [phone]      (Manual "Tilt-n-Space")  []  patient is dependent for transfers  []  patient requires frequent       positioning for pressure relief   []  patient requires frequent      positioning for poor/absent trunk control        []  Stroller Base  []  infant/child   []  unable to propel manual      wheelchair  []  allows for growth  []  non-functional ambulator  []  non-functional UE  []  independent mobility is not a goal at this time    MANUAL FRAME OPTIONS      Push handles  []  extended   []  angle adjustable   []  standard  []  caregiver access  []  caregiver assist    []  allows "hooking" to enable      increased ability to perform ADLs or maintain balance   []  Angle Adjustable Back  []  postural control  []  control of tone/spasticity  []  accommodation of range of motion  []  UE functional control  []  accommodation for seating system    Rear wheel placement  []  std/fixed  [] fully adjustableramputee   []  camber ________degree  []  removable rear wheel  []  non-removable rear wheel  Wheel size _______  Wheel style_______________________  []  improved UE access to wheels  []  increase propulsion ability  []  improved stability  []  changing angle in space for      improvement of postural stability  []  remove for transport    []  allow for seating system to fit on  base  []  amputee placement  []  1-arm drive access   r R  r L  []  enable propulsion of manual       wheelchair with one arm    []  amputee placement   Wheel rims/ Hand rims  []  Standard    []  Specialized-____ []  provide  ability to propel manual   []  increase self-propulsion with hand wheelchair weakness/decreased grasp     []  Spoke protector/guard   []  prevent hands from getting  caught in spokes   Tires:  []  pneumatic  []  flat free inserts  []  solid  Style:  []  decrease roll resistance              []  prevent frequent flats  []  increase shock absorbency  []  decrease maintenance   []  decrease pain from road shock    []  decrease spasms from road shock    Wheel Locks:    []  push []  pull []  scissor  []  lock wheels for transfers  []  lock wheels from rolling   Brake/wheel lock extension:  []  R  []  L  []  allow user to operate wheel locks due to decreased reach or strength   Caster housing:  Caster size:                      Style:                                          []  suspension fork  []  maneuverability   []  stability of wheelchair   []  durability  []  maintenance  []  angle adjustment for posture  []  allow for feet to come under        wheelchair base  []  allows change in seat to floor  height   []  increase shock absorbency  []  decrease pain from road shock  []  decrease spasms from road    shock   []  Side guards  []  prevent clothing getting caught in wheel or becoming soiled   [] provide hip and pelvic stability  []  eliminates contact between body and wheels  []  limit hand contact with wheels   []  Anti-tippers      []  prevent wheelchair from tipping    backward  []  assist caregiver with curbs     POWER MOBILITY      []  Scooter/POV    []  can safely operate   []  can safely transfer   []  has adequate trunk stability   []  cannot functionally propel  manual wheelchair    []  Power mobility base    []  non-ambulatory   []  cannot functionally propel manual wheelchair   []  cannot functionally and safely      operate scooter/POV  []  can safely operate power       wheelchair  []  home is accessible  []  willing to use power wheelchair     Tilt  []  Powered tilt on powered chair  []  Powered tilt on  manual chair  []  Manual tilt on manual chair Comments:  []  change position for pressure      []  elief/cannot weight shift   []  change position against      gravitational force on head and      shoulders   []  decrease pain  []  blood pressure management   []  control autonomic dysreflexia  []  decrease respiratory distress  []  management of spasticity  []  management of low tone  []  facilitate postural control   []  rest periods   []  control edema  []  increase sitting tolerance   []  aid with transfers     Recline   []  Power recline on power chair  []  Manual recline on manual chair  Comments:    []  intermittent catheterization  []  manage spasticity  []  accommodate femur to back angle  []  change position for pressure relief/cannot weight shift rhigh risk of  pressure sore development  []  tilt alone does not accomplish     effective pressure relief, maximum pressure relief achieved at -      _______ degrees tilt   _______ degrees recline   []  difficult to transfer to and from bed []  rest periods and sleeping in chair  []  repositioning for transfers  []  bring to full recline for ADL care  []  clothing/diaper changes in chair  []  gravity PEG tube feeding  []  head positioning  []  decrease pain  []  blood pressure management   []  control autonomic dysreflexia  []  decrease respiratory distress  []  user on ventilator     Elevator on mobility base  []  Power wheelchair  []  Scooter  []  increase Indep in transfers   []  increase Indep in ADLs    []  bathroom function and safety  []  kitchen/cooking function and safety  []  shopping  []  raise height for communication at standing level  []  raise height for eye contact which reduces cervical neck strain and pain  []  drive at raised height for safety and navigating crowds  []  Other:   []  Vertical position system  (anterior tilt)     (Drive locks-out)    []  Stand       (Drive enabled)  []  independent weight bearing  []  decrease joint  contractures  []  decrease/manage spasticity  []  decrease/manage spasms  []  pressure distribution away from   scapula, sacrum, coccyx, and ischial tuberosity  []  increase digestion and elimination   []  access to counters and cabinets  []  increase reach  []  increase interaction with others at eye level, reduces neck strain  []  increase performance of       MRADL(s)      Power elevating legrest    []  Center mount (Single) 85-170 degrees       []  Standard (Pair) 100-170 degrees  []  position legs at 90 degrees, not available with std power ELR  []  center mount tucks into chair to decrease turning radius in home, not available with std power ELR  []  provide change in position for LE  []  elevate legs during recline    []  maintain placement of feet on      footplate  []  decrease edema  []  improve circulation  []  actuator needed to elevate legrest  []  actuator needed to articulate legrest preventing knees from flexing  []  Increase ground clearance over      curbs  []   STD (pair) independently                     elevate legrest   POWER WHEELCHAIR CONTROLS      Controls/input device  []  Expandable  []  Non-expandable  []  Proportional  []  Right Hand []  Left Hand  []  Non-proportional/switches/head-array  []  Electrical/proximity         []   Mechanical      Manufacturer:___________________   Type:________________________ []  provides access for controlling wheelchair  []  programming for accurate control  []  progressive disease/changing condition  []  required for alternative drive      controls       []  lacks motor control to operate  proportional drive control  []  unable to understand proportional controls  []  limited movement/strength  []  extraneous movement / tremors / ataxic / spastic       []  Upgraded electronics controller/harness    []  Single power (tilt or recline)   []  Expandable    []  Non-expandable plus   []   Multi-power (tilt, recline, power legrest, power seat lift,  vertical positioning system, stand)  []  allows input device to communicate with drive motors  []  harness provides necessary connections between the controller, input device, and seat functions     []  needed in order to operate power seat functions through joystick/ input device  []  required for alternative drive controls     []  Enhanced display  []  required to connect all alternative drive controls   []  required for upgraded joystick      (lite-throw, heavy duty, micro)  []  Allows user to see in which mode and drive the wheelchair is set; necessary for alternate controls       []  Upgraded tracking electronics  []  correct tracking when on uneven surfaces makes switch driving more efficient and less fatiguing  []  increase safety when driving  []  increase ability to traverse thresholds    []  Safety / reset / mode switches     Type:    []  Used to change modes and stop the wheelchair when driving     []  Mount for joystick / input device/switches  []  swing away for access or transfers   []  attaches joystick / input device / switches to wheelchair   []  provides for consistent access  []  midline for optimal placement    []  Attendant controlled joystick plus     mount  []  safety  []  long distance driving  []  operation of seat functions  []  compliance with transportation regulations    []  Battery  []  required to power (power assist / scooter/ power wc / other):   []  Power inverter (24V to 12V)  []  required for ventilator / respiratory equipment / other:     CHAIR OPTIONS MANUAL & POWER      Armrests   []  adjustable height []  removable  []  swing away []  fixed  []  flip back  []  reclining  []  full length pads []  desk []  tube arms []  gel pads  []  provide support with elbow at 90    []  remove/flip back/swing away for  transfers  []  provide support and positioning of upper body    []  allow to come closer to table top  []  remove for access to tables  []  provide support for w/c tray  []  change of  height/angles for variable activities   []  Elbow support / Elbow stop  []  keep elbow positioned on arm pad  []  keep arms from falling off arm pad  during tilt and/or recline   Upper Extremity Support  []  Arm trough  []   R  []   L  Style:  []  swivel mount []  fixed mount   []  posterior hand support  []   tray  []  full tray  []  joystick cut out  []   R  []   L  Style:  []  decrease gravitational pull on      shoulders  []  provide support to increase UE  function  []  provide hand support in natural    position  []  position flaccid UE  []  decrease subluxation    []  decrease edema       []  manage spasticity   []  provide midline positioning  []  provide work surface  []  placement for AAC/ Computer/ EADL       Hangers/ Legrests   []  ______ degree  []  Elevating []  articulating  []  swing away []  fixed []  lift off  []  heavy duty  []  adjustable knee angle  []  adjustable  calf panel   []  longer extension tube              []  provide LE support  []  maintain placement of feet on      footplate   []  accommodate lower leg length  []  accommodate to hamstring       tightness  []  enable transfers  []  provide change in position for LE's  []  elevate legs during recline    []  decrease edema  []  durability      Foot support   []  footplate []  R []  L []  flip up           []  Depth adjustable   []  angle adjustable  []  foot board/one piece    []  provide foot support  []  accommodate to ankle ROM  []  allow foot to go under wheelchair base  []  enable transfers     []  Shoe holders  []  position foot    []  decrease / manage spasticity  []  control position of LE  []  stability    []  safety     []  Ankle strap/heel      loops  []  support foot on foot support  []  decrease extraneous movement  []  provide input to heel   []  protect foot     []  Amputee adapter []  R  []  L     Style:                  Size:  []  Provide support for stump/residual extremity    []  Transportation tie-down  []  to provide crash tested  tie-down brackets    []  Crutch/cane holder    []  O2 holder    []  IV hanger   []  Ventilator tray/mount    []  stabilize accessory on wheelchair       Component  Justification     []  Seat cushion      []  accommodate impaired sensation  []  decubitus ulcers present or history  []  unable to shift weight  []  increase pressure distribution  []  prevent pelvic extension  []  custom required "off-the-shelf"    seat cushion will not accommodate deformity  []  stabilize/promote pelvis alignment  []  stabilize/promote femur alignment  []  accommodate obliquity  []  accommodate multiple deformity  []  incontinent/accidents  []  low maintenance     []  seat mounts                 []  fixed []  removable  []  attach seat platform/cushion to wheelchair frame    []  Seat wedge    []  provide increased aggressiveness of seat shape to decrease sliding  down in the seat  []  accommodate ROM        []  Cover replacement   []  protect back or seat cushion  []  incontinent/accidents    []  Solid seat / insert    []  support cushion to prevent      hammocking  []  allows attachment of cushion to mobility base    []  Lateral pelvic/thigh/hip     support (Guides)     []  decrease abduction  []  accommodate pelvis  []  position upper legs  []  accommodate spasticity  []  removable for transfers     []  Lateral pelvic/thigh      supports mounts  []  fixed   []  swing-away   []  removable  []  mounts lateral pelvic/thigh supports     []  mounts lateral pelvic/thigh supports swing-away or removable for transfers    []  Medial thigh support (  Pommel)  [] decrease adduction  [] accommodate ROM  []  remove for transfers   []  alignment      []  Medial thigh   []  fixed      support mounts      []  swing-away   []  removable  []  mounts medial thigh supports   []  Mounts medial supports swing- away or removable for transfers       Component  Justification   []  Back       []  provide posterior trunk support []  facilitate tone  []  provide  lumbar/sacral support []  accommodate deformity  []  support trunk in midline   []  custom required "off-the-shelf" back support will not accommodate deformity   []  provide lateral trunk support []  accommodate or decrease tone            []  Back mounts  []  fixed  []  removable  []  attach back rest/cushion to wheelchair frame   []  Lateral trunk      supports  []  R []  L  []  decrease lateral trunk leaning  []  accommodate asymmetry    []  contour for increased contact  []  safety    []  control of tone    []  Lateral trunk      supports mounts  []  fixed  []  swing-away   []  removable  []  mounts lateral trunk supports     []  Mounts lateral trunk supports swing-away or removable for transfers   []  Anterior chest      strap, vest     []  decrease forward movement of shoulder  []  decrease forward movement of trunk  []  safety/stability  []  added abdominal support  []  trunk alignment  []  assistance with shoulder control   []  decrease shoulder elevation    []  Headrest      []  provide posterior head support  []  provide posterior neck support  []  provide lateral head support  []  provide anterior head support  []  support during tilt and recline  []  improve feeding     []  improve respiration  []  placement of switches  []  safety    []  accommodate ROM   []  accommodate tone  []  improve visual orientation   []  Headrest           []  fixed []  removable []  flip down      Mounting hardware   []  swing-away laterals/switches  []  mount headrest   []  mounts headrest flip down or  removable for transfers  []  mount headrest swing-away laterals   []  mount switches     []  Neck Support    []  decrease neck rotation  []  decrease forward neck flexion   Pelvic Positioner    []  std hip belt          []  padded hip belt  []  dual pull hip belt  []  four point hip belt  []  stabilize tone  []  decrease falling out of chair  []  prevent excessive extension  []  special pull angle to control      rotation  []  pad for  protection over boney   prominence  []  promote comfort    []  Essential needs        bag/pouch   []  medicines []  special food rorthotics []  clothing changes  []  diapers  []  catheter/hygiene []  ostomy supplies   The above equipment has a life- long use expectancy.  Growth and changes in medical and/or functional conditions would be the exceptions.   SUMMARY:  Why mobility device was selected;  include why a lower level device is not appropriate: ***  ASSESSMENT:  CLINICAL IMPRESSION: Patient is a *** y.o. *** who was seen today for physical therapy evaluation and treatment for ***.    OBJECTIVE IMPAIRMENTS {opptimpairments:25111}.   ACTIVITY LIMITATIONS {activitylimitations:27494}  PARTICIPATION LIMITATIONS: {participationrestrictions:25113}  PERSONAL FACTORS {Personal factors:25162} are also affecting patient's functional outcome.   REHAB POTENTIAL: {rehabpotential:25112}  CLINICAL DECISION MAKING: {clinical decision making:25114}  EVALUATION COMPLEXITY: {Evaluation complexity:25115}                                   GOALS: One time visit. No goals established.    PLAN: PT FREQUENCY: one time visit    Raj LOISE Blanch, PT 12/17/2023, 8:40 AM    I concur with the above findings and recommendations of the therapist:  Physician name printed:         Physician's signature:      Date:

## 2023-12-18 DIAGNOSIS — I63311 Cerebral infarction due to thrombosis of right middle cerebral artery: Secondary | ICD-10-CM | POA: Diagnosis not present

## 2023-12-19 DIAGNOSIS — I63311 Cerebral infarction due to thrombosis of right middle cerebral artery: Secondary | ICD-10-CM | POA: Diagnosis not present

## 2023-12-20 DIAGNOSIS — I63311 Cerebral infarction due to thrombosis of right middle cerebral artery: Secondary | ICD-10-CM | POA: Diagnosis not present

## 2023-12-21 ENCOUNTER — Ambulatory Visit: Payer: Self-pay

## 2023-12-21 NOTE — Progress Notes (Deleted)
 12/21/2023 Name: Frank Schneider MRN: [id number] DOB: 24-Jun-1975  No chief complaint on file.   Frank Schneider is a 48 y.o. year old male who presented for a telephone visit.   They were referred to the pharmacist by their PCP for assistance in managing diabetes. PMH includes CVA (2016, 2020), PAD, T2DM, tobacco dependence, HLD,    Subjective: Patient was last seen by PCP, Frank Borer, NP, on 10/20/23. At last visit, BP was 123/80 mmHg, HR 68. Patient had recently been hospitalized from 08/29/23 to 09/03/23 for SIRS with gastritis (possible gastoenteritis vs gastroparesis due to Ozempic ). He was discharged on a liquid diet and told to hold Ozempic . He has only been taking Lantus  and Novolog  since discharge. His labwork from PCP appt on 10/20/23 was stable, but BG on BMP was 42 mg/dL. He was engaged by pharmacy via telephone on 11/05/23. At this visit, he denied recurrent n/v. He was taking Lantus  and Novolog  as prescribed. He was scheduled for an in-person visit to set up his Freestyle Libre CGM.   Today, patient reports doing ok. No specific complaints at this time. He has not had any recurrent s/sx of nausea or vomiting over the past month.   Due for A1C Due for Lipid panel (fasting?) - add direct LDL?   Care Team: Primary Care Provider: Borer Frank RAMAN, NP ; Next Scheduled Visit: 01/20/24  Medication Access/Adherence  Current Pharmacy:  Frank Schneider - [facility] [address] [place] [address] Phone: [phone] Fax: [phone]  [facility] - [place], [place] - [address] [place] [address] Phone: [phone] Fax: [phone]   Patient reports affordability concerns with their medications: No  Patient reports access/transportation concerns to their pharmacy: No  Patient reports adherence concerns with their medications:  No  - denies missed doses of Lantus  or Novolog . He is a Frank Schneider compliance  packaging patient.   Diabetes:  Current medications: Lantus  (insulin  glargine) 14 units daily, Novolog  (insulin  aspart) 4 units before each meal (typically 2x per day) Medications tried in the past: Ozempic   Denies any recent GI upset like nausea or vomiting.  Current glucose readings:  Using Accu Chek Guide meter; testing every day (fasting) 11/04/23: 220 mg/dL 2/0/74: 854 mg/dL 05/28/72: 819 mg/dL 05/29/72: 880 mg/dL 05/30/72: 883 mg/dL 05/31/72: 80 mg/dL 06/01/72: 883 mg/dL   He does have some Freestyle Libre 3 sensors left at home, but has not been able to connect them to his phone. He was last connected to LibreView on 08/26/23.    Discussed that his last BMP BG was 42 mg/dL, but he did not remember having any specific symptoms at this time.   Patient denies hypoglycemic s/sx including dizziness, shakiness, sweating. Patient denies hyperglycemic symptoms including polyuria, polydipsia, polyphagia, nocturia, neuropathy, blurred vision.  Current meal patterns: Usually eats 2 meals per day.  Hypertension:  Current medications: amlodipine  10 mg daily, carvedilol  12.5 mg BID, lisinopril  40 mg daily  Patient has a validated, automated, upper arm home BP cuff - home health nurse checks once/week.  Current blood pressure readings readings: Does not recall specific readings, but reports it has been normal  Patient denies hypotensive s/sx including dizziness, lightheadedness.  Patient denies hypertensive symptoms including headache, chest pain, shortness of breath   Hyperlipidemia/ASCVD Risk Reduction  Current lipid lowering medications: atorvastatin  40 mg daily  Antiplatelet regimen: aspirin  325 mg daily, cilostazol  100 mg BID (Rx expired 09/03/22)  ASCVD History: stroke, PAD Risk Factors:  tobacco use, known ASCVD, T2DM  Clinical ASCVD: Yes  The ASCVD Risk score (Arnett DK, et al., 2019) failed to calculate for the following reasons:   Risk score cannot be calculated because patient  has a medical history suggesting prior/existing ASCVD    Objective:  BP Readings from Last 3 Encounters:  10/20/23 123/80  09/03/23 (!) 159/72  03/24/23 (!) 149/77    Lab Results  Component Value Date   HGBA1C 8.9 (H) 08/31/2023   HGBA1C 9.0 (H) 08/29/2023   HGBA1C 7.4 (A) 01/07/2023       Latest Ref Rng & Units 10/20/2023    2:56 PM 09/03/2023    5:23 AM 09/02/2023    3:41 AM  BMP  Glucose 70 - 99 mg/dL 42  815  823   BUN 6 - 24 mg/dL 11  9  10    Creatinine 0.76 - 1.27 mg/dL 9.04  9.17  9.18   BUN/Creat Ratio 9 - 20 12     Sodium 134 - 144 mmol/L 142  137  137   Potassium 3.5 - 5.2 mmol/L 4.1  4.1  3.9   Chloride 96 - 106 mmol/L 104  103  103   CO2 20 - 29 mmol/L 22  27  28    Calcium  8.7 - 10.2 mg/dL 9.9  8.5  8.6     Lab Results  Component Value Date   CHOL 88 (L) 06/22/2022   HDL 30 (L) 06/22/2022   LDLCALC 43 06/22/2022   TRIG 380 (H) 08/30/2023   CHOLHDL 2.9 06/22/2022    Medications Reviewed Today   Medications were not reviewed in this encounter       Assessment/Plan:   Diabetes: - Currently uncontrolled with most recent A1C of 8.9% above goal <7%. Medication adherence appears optimal. Patient reported FBG are variable. Hesitant to increase insulin  dose today with some FBG at goal and recent BG on BMP of 42 mg/dL. Would prefer to patient restart wearing CGM to detect whether he is having any hypoglycemia. He is amenable to coming in person for CGM setup. Unclear whether GI symptoms leading to hospitalization in May 2025 were related to GLP-1RA, but given current glycemic control and severity of episode, will not restart at this time.   - Last UACR of 13 mg/g on 06/22/22 - Patient denies personal or family history of multiple endocrine neoplasia type 2, medullary thyroid  cancer; personal history of pancreatitis or gallbladder disease. - Reviewed long term cardiovascular and renal outcomes of uncontrolled blood sugar - Reviewed goal A1c, goal fasting, and goal  2 hour post prandial glucose - Reviewed hypoglycemia management plan and the rule of 15 - Reviewed dietary modifications including  utilizing the healthy plate method, limiting portion size of carbohydrate foods, increasing intake of protein and non-starchy vegetables. Counseled patient to stay hydrated with water throughout the day. - Recommend to continue Lantus  14 units daily and Novolog  4 units before each meal (typically twice daily)  - Recommend to check glucose twice daily: fasting and 2-hr PPG continuously using FL3+ CGM. Educated patient to bring supplies for setup at next appointment. In the meantime, continuing monitoring BG before each meal.  - Next A1C due 12/01/2023    Hypertension: - Currently controlled with last clinic BP below goal less than 130/80. Patient is not having s/sx of hypo- or hyper-tension. Medication adherence appears appropriate.  - Reviewed long term cardiovascular and renal outcomes of uncontrolled blood pressure - Reviewed appropriate blood pressure monitoring technique and reviewed goal blood  pressure. Recommended to check home blood pressure and heart rate once weekly with home health nurse.  - Recommend to continue amlodipine  10 mg daily, carvedilol  12.5 mg BID, lisinopril  40 mg daily    Hyperlipidemia/ASCVD Risk Reduction: - Currently controlled with most recent LDL-C of 43 mg/g below goal < 55 mg/dL given premature and progressive ASCVD. High intensity statin indicated. Noted that recent TG were elevated to 380 mg/dL. Expect improvement with continued adherence to insulin  and improved glycemic control. Noted that prescription for cilostazol  expired in May, which patient was taking for claudication. He does not have any contraindications to continued therapy. Will request that PCP provides refill so this can be included in the patient's next compliance packaging  - Reviewed long term complications of uncontrolled cholesterol - Recommend to continue atorvastatin   40 mg daily.  - Recommend to continue aspirin  325 mg daily - Recommend to restart cilostazol  100 mg BID. Will collaborate with PCP to place orders.    Follow Up Plan:  Pharmacist in person 12/01/23 PCP clinic visit 01/20/24   Lorain Baseman, PharmD [facility] [phone]

## 2023-12-22 DIAGNOSIS — I63311 Cerebral infarction due to thrombosis of right middle cerebral artery: Secondary | ICD-10-CM | POA: Diagnosis not present

## 2023-12-23 DIAGNOSIS — I63311 Cerebral infarction due to thrombosis of right middle cerebral artery: Secondary | ICD-10-CM | POA: Diagnosis not present

## 2023-12-24 ENCOUNTER — Other Ambulatory Visit: Payer: Self-pay

## 2023-12-24 ENCOUNTER — Other Ambulatory Visit: Payer: Self-pay | Admitting: Nurse Practitioner

## 2023-12-24 ENCOUNTER — Other Ambulatory Visit (HOSPITAL_COMMUNITY): Payer: Self-pay

## 2023-12-24 DIAGNOSIS — I10 Essential (primary) hypertension: Secondary | ICD-10-CM

## 2023-12-24 DIAGNOSIS — I63311 Cerebral infarction due to thrombosis of right middle cerebral artery: Secondary | ICD-10-CM | POA: Diagnosis not present

## 2023-12-24 DIAGNOSIS — E785 Hyperlipidemia, unspecified: Secondary | ICD-10-CM

## 2023-12-24 MED ORDER — ASPIRIN 325 MG PO TABS
325.0000 mg | ORAL_TABLET | Freq: Every day | ORAL | 1 refills | Status: AC
  Filled 2023-12-24 – 2024-01-11 (×5): qty 30, 30d supply, fill #0
  Filled 2024-02-03: qty 30, 30d supply, fill #1
  Filled 2024-03-06: qty 30, 30d supply, fill #2
  Filled 2024-04-04: qty 30, 30d supply, fill #3
  Filled 2024-05-05: qty 30, 30d supply, fill #4

## 2023-12-24 MED ORDER — ATORVASTATIN CALCIUM 40 MG PO TABS
40.0000 mg | ORAL_TABLET | Freq: Every day | ORAL | 1 refills | Status: AC
  Filled 2023-12-24 – 2024-01-11 (×4): qty 30, 30d supply, fill #0
  Filled 2024-02-03: qty 30, 30d supply, fill #1
  Filled 2024-03-06: qty 30, 30d supply, fill #2
  Filled 2024-04-04: qty 30, 30d supply, fill #3
  Filled 2024-05-05: qty 30, 30d supply, fill #4

## 2023-12-26 DIAGNOSIS — I63311 Cerebral infarction due to thrombosis of right middle cerebral artery: Secondary | ICD-10-CM | POA: Diagnosis not present

## 2023-12-27 DIAGNOSIS — I63311 Cerebral infarction due to thrombosis of right middle cerebral artery: Secondary | ICD-10-CM | POA: Diagnosis not present

## 2023-12-28 ENCOUNTER — Telehealth: Payer: Self-pay

## 2023-12-28 ENCOUNTER — Ambulatory Visit: Payer: Self-pay

## 2023-12-28 DIAGNOSIS — I63311 Cerebral infarction due to thrombosis of right middle cerebral artery: Secondary | ICD-10-CM | POA: Diagnosis not present

## 2023-12-28 NOTE — Telephone Encounter (Signed)
 Attempted to contact patient for scheduled appointment for medication management. Left HIPAA compliant message for patient to return my call at their convenience.   Lorain Baseman, PharmD [facility] [phone]

## 2023-12-28 NOTE — Progress Notes (Deleted)
 12/28/2023 Name: Frank Schneider MRN: [id number] DOB: 04/30/1975  No chief complaint on file.   Frank Schneider is a 48 y.o. year old male who presented for a telephone visit.   They were referred to the pharmacist by their PCP for assistance in managing diabetes. PMH includes CVA (2016, 2020), PAD, T2DM, tobacco dependence, HLD,    Subjective: Patient was last seen by PCP, [name], NP, on 10/20/23. At last visit, BP was 123/80 mmHg, HR 68. Patient had recently been hospitalized from 08/29/23 to 09/03/23 for SIRS with gastritis (possible gastoenteritis vs gastroparesis due to Ozempic ). He was discharged on a liquid diet and told to hold Ozempic . He has only been taking Lantus  and Novolog  since discharge. His labwork from PCP appt on 10/20/23 was stable, but BG on BMP was 42 mg/dL. He was engaged by pharmacy via telephone on 11/05/23. At this visit, he denied recurrent n/v. He was taking Lantus  and Novolog  as prescribed. He was scheduled for an in-person visit to set up his Freestyle Libre CGM.   Today, patient reports doing ok. No specific complaints at this time. He has not had any recurrent s/sx of nausea or vomiting over the past month.   Due for A1C Due for Lipid panel (fasting?) - add direct LDL?   Care Team: Primary Care Provider: [name], NP ; Next Scheduled Visit: 01/20/24  Medication Access/Adherence  Current Pharmacy:  DARRYLE LAW - [facility] [address] [place] [address] Phone: [phone] Fax: [phone]  [facility] - [place], [place] - [address] [place] [address] Phone: [phone] Fax: [phone]   Patient reports affordability concerns with their medications: No  Patient reports access/transportation concerns to their pharmacy: No  Patient reports adherence concerns with their medications:  No  - denies missed doses of Lantus  or Novolog . He is a [place] compliance  packaging patient.   Diabetes:  Current medications: Lantus  (insulin  glargine) 14 units daily, Novolog  (insulin  aspart) 4 units before each meal (typically 2x per day) Medications tried in the past: Ozempic   Denies any recent GI upset like nausea or vomiting.  Current glucose readings:  Using Accu Chek Guide meter; testing every day (fasting) 11/04/23: 220 mg/dL 2/0/74: 854 mg/dL 05/28/72: 819 mg/dL 05/29/72: 880 mg/dL 05/30/72: 883 mg/dL 05/31/72: 80 mg/dL 06/01/72: 883 mg/dL   He does have some Freestyle Libre 3 sensors left at home, but has not been able to connect them to his phone. He was last connected to LibreView on 08/26/23.    Discussed that his last BMP BG was 42 mg/dL, but he did not remember having any specific symptoms at this time.   Patient denies hypoglycemic s/sx including dizziness, shakiness, sweating. Patient denies hyperglycemic symptoms including polyuria, polydipsia, polyphagia, nocturia, neuropathy, blurred vision.  Current meal patterns: Usually eats 2 meals per day.  Hypertension:  Current medications: amlodipine  10 mg daily, carvedilol  12.5 mg BID, lisinopril  40 mg daily  Patient has a validated, automated, upper arm home BP cuff - home health nurse checks once/week.  Current blood pressure readings readings: Does not recall specific readings, but reports it has been normal  Patient denies hypotensive s/sx including dizziness, lightheadedness.  Patient denies hypertensive symptoms including headache, chest pain, shortness of breath   Hyperlipidemia/ASCVD Risk Reduction  Current lipid lowering medications: atorvastatin  40 mg daily  Antiplatelet regimen: aspirin  325 mg daily, cilostazol  100 mg BID (Rx expired 09/03/22)  ASCVD History: stroke, PAD Risk Factors:  tobacco use, known ASCVD, T2DM  Clinical ASCVD: Yes  The ASCVD Risk score (Arnett DK, et al., 2019) failed to calculate for the following reasons:   Risk score cannot be calculated because patient  has a medical history suggesting prior/existing ASCVD    Objective:  BP Readings from Last 3 Encounters:  10/20/23 123/80  09/03/23 (!) 159/72  03/24/23 (!) 149/77    Lab Results  Component Value Date   HGBA1C 8.9 (H) 08/31/2023   HGBA1C 9.0 (H) 08/29/2023   HGBA1C 7.4 (A) 01/07/2023       Latest Ref Rng & Units 10/20/2023    2:56 PM 09/03/2023    5:23 AM 09/02/2023    3:41 AM  BMP  Glucose 70 - 99 mg/dL 42  815  823   BUN 6 - 24 mg/dL 11  9  10    Creatinine 0.76 - 1.27 mg/dL 9.04  9.17  9.18   BUN/Creat Ratio 9 - 20 12     Sodium 134 - 144 mmol/L 142  137  137   Potassium 3.5 - 5.2 mmol/L 4.1  4.1  3.9   Chloride 96 - 106 mmol/L 104  103  103   CO2 20 - 29 mmol/L 22  27  28    Calcium  8.7 - 10.2 mg/dL 9.9  8.5  8.6     Lab Results  Component Value Date   CHOL 88 (L) 06/22/2022   HDL 30 (L) 06/22/2022   LDLCALC 43 06/22/2022   TRIG 380 (H) 08/30/2023   CHOLHDL 2.9 06/22/2022    Medications Reviewed Today   Medications were not reviewed in this encounter       Assessment/Plan:   Diabetes: - Currently uncontrolled with most recent A1C of 8.9% above goal <7%. Medication adherence appears optimal. Patient reported FBG are variable. Hesitant to increase insulin  dose today with some FBG at goal and recent BG on BMP of 42 mg/dL. Would prefer to patient restart wearing CGM to detect whether he is having any hypoglycemia. He is amenable to coming in person for CGM setup. Unclear whether GI symptoms leading to hospitalization in May 2025 were related to GLP-1RA, but given current glycemic control and severity of episode, will not restart at this time.   - Last UACR of 13 mg/g on 06/22/22 - Patient denies personal or family history of multiple endocrine neoplasia type 2, medullary thyroid  cancer; personal history of pancreatitis or gallbladder disease. - Reviewed long term cardiovascular and renal outcomes of uncontrolled blood sugar - Reviewed goal A1c, goal fasting, and goal  2 hour post prandial glucose - Reviewed hypoglycemia management plan and the rule of 15 - Reviewed dietary modifications including  utilizing the healthy plate method, limiting portion size of carbohydrate foods, increasing intake of protein and non-starchy vegetables. Counseled patient to stay hydrated with water throughout the day. - Recommend to continue Lantus  14 units daily and Novolog  4 units before each meal (typically twice daily)  - Recommend to check glucose twice daily: fasting and 2-hr PPG continuously using FL3+ CGM. Educated patient to bring supplies for setup at next appointment. In the meantime, continuing monitoring BG before each meal.  - Next A1C due 12/01/2023    Hypertension: - Currently controlled with last clinic BP below goal less than 130/80. Patient is not having s/sx of hypo- or hyper-tension. Medication adherence appears appropriate.  - Reviewed long term cardiovascular and renal outcomes of uncontrolled blood pressure - Reviewed appropriate blood pressure monitoring technique and reviewed goal blood  pressure. Recommended to check home blood pressure and heart rate once weekly with home health nurse.  - Recommend to continue amlodipine  10 mg daily, carvedilol  12.5 mg BID, lisinopril  40 mg daily    Hyperlipidemia/ASCVD Risk Reduction: - Currently controlled with most recent LDL-C of 43 mg/g below goal < 55 mg/dL given premature and progressive ASCVD. High intensity statin indicated. Noted that recent TG were elevated to 380 mg/dL. Expect improvement with continued adherence to insulin  and improved glycemic control. Noted that prescription for cilostazol  expired in May, which patient was taking for claudication. He does not have any contraindications to continued therapy. Will request that PCP provides refill so this can be included in the patient's next compliance packaging  - Reviewed long term complications of uncontrolled cholesterol - Recommend to continue atorvastatin   40 mg daily.  - Recommend to continue aspirin  325 mg daily - Recommend to restart cilostazol  100 mg BID. Will collaborate with PCP to place orders.    Follow Up Plan:  Pharmacist in person 12/01/23 PCP clinic visit 01/20/24   Lorain Baseman, PharmD [facility] [phone]

## 2023-12-29 DIAGNOSIS — I63311 Cerebral infarction due to thrombosis of right middle cerebral artery: Secondary | ICD-10-CM | POA: Diagnosis not present

## 2023-12-30 ENCOUNTER — Other Ambulatory Visit: Payer: Self-pay

## 2023-12-30 DIAGNOSIS — I63311 Cerebral infarction due to thrombosis of right middle cerebral artery: Secondary | ICD-10-CM | POA: Diagnosis not present

## 2023-12-31 ENCOUNTER — Other Ambulatory Visit: Payer: Self-pay

## 2023-12-31 DIAGNOSIS — I63311 Cerebral infarction due to thrombosis of right middle cerebral artery: Secondary | ICD-10-CM | POA: Diagnosis not present

## 2024-01-01 DIAGNOSIS — I63311 Cerebral infarction due to thrombosis of right middle cerebral artery: Secondary | ICD-10-CM | POA: Diagnosis not present

## 2024-01-02 DIAGNOSIS — I63311 Cerebral infarction due to thrombosis of right middle cerebral artery: Secondary | ICD-10-CM | POA: Diagnosis not present

## 2024-01-03 DIAGNOSIS — I63311 Cerebral infarction due to thrombosis of right middle cerebral artery: Secondary | ICD-10-CM | POA: Diagnosis not present

## 2024-01-04 ENCOUNTER — Other Ambulatory Visit: Payer: Self-pay

## 2024-01-04 DIAGNOSIS — I63311 Cerebral infarction due to thrombosis of right middle cerebral artery: Secondary | ICD-10-CM | POA: Diagnosis not present

## 2024-01-05 ENCOUNTER — Other Ambulatory Visit: Payer: Self-pay

## 2024-01-05 DIAGNOSIS — I63311 Cerebral infarction due to thrombosis of right middle cerebral artery: Secondary | ICD-10-CM | POA: Diagnosis not present

## 2024-01-06 DIAGNOSIS — I63311 Cerebral infarction due to thrombosis of right middle cerebral artery: Secondary | ICD-10-CM | POA: Diagnosis not present

## 2024-01-07 ENCOUNTER — Other Ambulatory Visit (HOSPITAL_COMMUNITY): Payer: Self-pay

## 2024-01-07 ENCOUNTER — Other Ambulatory Visit: Payer: Self-pay

## 2024-01-07 ENCOUNTER — Other Ambulatory Visit: Payer: Self-pay | Admitting: Nurse Practitioner

## 2024-01-07 DIAGNOSIS — I10 Essential (primary) hypertension: Secondary | ICD-10-CM

## 2024-01-07 DIAGNOSIS — I63311 Cerebral infarction due to thrombosis of right middle cerebral artery: Secondary | ICD-10-CM | POA: Diagnosis not present

## 2024-01-07 MED ORDER — PANTOPRAZOLE SODIUM 40 MG PO TBEC
40.0000 mg | DELAYED_RELEASE_TABLET | Freq: Every day | ORAL | 3 refills | Status: AC
  Filled 2024-01-07 – 2024-01-11 (×2): qty 30, 30d supply, fill #0
  Filled 2024-02-03: qty 30, 30d supply, fill #1
  Filled 2024-04-04: qty 30, 30d supply, fill #2
  Filled 2024-05-05: qty 30, 30d supply, fill #3

## 2024-01-07 MED ORDER — LISINOPRIL 40 MG PO TABS
40.0000 mg | ORAL_TABLET | Freq: Every day | ORAL | 3 refills | Status: DC
  Filled 2024-01-07 – 2024-01-11 (×2): qty 30, 30d supply, fill #0
  Filled 2024-02-03: qty 30, 30d supply, fill #1
  Filled 2024-03-06: qty 30, 30d supply, fill #2
  Filled 2024-04-04: qty 30, 30d supply, fill #3

## 2024-01-07 MED ORDER — CARVEDILOL 12.5 MG PO TABS
12.5000 mg | ORAL_TABLET | Freq: Two times a day (BID) | ORAL | 3 refills | Status: AC
  Filled 2024-01-07 – 2024-01-11 (×2): qty 60, 30d supply, fill #0
  Filled 2024-02-03: qty 60, 30d supply, fill #1
  Filled 2024-04-04: qty 60, 30d supply, fill #2
  Filled 2024-05-05: qty 60, 30d supply, fill #3

## 2024-01-08 DIAGNOSIS — I63311 Cerebral infarction due to thrombosis of right middle cerebral artery: Secondary | ICD-10-CM | POA: Diagnosis not present

## 2024-01-09 DIAGNOSIS — R3981 Functional urinary incontinence: Secondary | ICD-10-CM | POA: Diagnosis not present

## 2024-01-09 DIAGNOSIS — E119 Type 2 diabetes mellitus without complications: Secondary | ICD-10-CM | POA: Diagnosis not present

## 2024-01-09 DIAGNOSIS — I63311 Cerebral infarction due to thrombosis of right middle cerebral artery: Secondary | ICD-10-CM | POA: Diagnosis not present

## 2024-01-10 DIAGNOSIS — I63311 Cerebral infarction due to thrombosis of right middle cerebral artery: Secondary | ICD-10-CM | POA: Diagnosis not present

## 2024-01-11 ENCOUNTER — Other Ambulatory Visit: Payer: Self-pay

## 2024-01-11 DIAGNOSIS — I63311 Cerebral infarction due to thrombosis of right middle cerebral artery: Secondary | ICD-10-CM | POA: Diagnosis not present

## 2024-01-12 ENCOUNTER — Other Ambulatory Visit: Payer: Self-pay

## 2024-01-12 DIAGNOSIS — I63311 Cerebral infarction due to thrombosis of right middle cerebral artery: Secondary | ICD-10-CM | POA: Diagnosis not present

## 2024-01-13 DIAGNOSIS — I63311 Cerebral infarction due to thrombosis of right middle cerebral artery: Secondary | ICD-10-CM | POA: Diagnosis not present

## 2024-01-14 DIAGNOSIS — I63311 Cerebral infarction due to thrombosis of right middle cerebral artery: Secondary | ICD-10-CM | POA: Diagnosis not present

## 2024-01-15 DIAGNOSIS — I63311 Cerebral infarction due to thrombosis of right middle cerebral artery: Secondary | ICD-10-CM | POA: Diagnosis not present

## 2024-01-16 DIAGNOSIS — I63311 Cerebral infarction due to thrombosis of right middle cerebral artery: Secondary | ICD-10-CM | POA: Diagnosis not present

## 2024-01-17 DIAGNOSIS — I63311 Cerebral infarction due to thrombosis of right middle cerebral artery: Secondary | ICD-10-CM | POA: Diagnosis not present

## 2024-01-18 ENCOUNTER — Other Ambulatory Visit: Payer: Self-pay | Admitting: Licensed Clinical Social Worker

## 2024-01-18 DIAGNOSIS — I63311 Cerebral infarction due to thrombosis of right middle cerebral artery: Secondary | ICD-10-CM | POA: Diagnosis not present

## 2024-01-18 NOTE — Patient Outreach (Signed)
 Complex Care Management   Visit Note  01/18/2024  Name:  Frank Schneider MRN: [id number] DOB: 10-10-1975  Situation: Referral received for Complex Care Management related to housing needs; vision needs; food scarcity I obtained verbal consent from Patient.  Visit completed with Patient  on the phone  Background:   Past Medical History:  Diagnosis Date   Anticoagulated    ASA 325 mg--- ;managed by pcp  (stroke prevention)   Beta thalassemia trait    hematology/ oncologist--- dr lanny (lov in epic 11-11-2022  elevated A2   Chronic foot pain    Diabetic neuropathy (HCC)    DOE (dyspnea on exertion)    02-23-2023  per pt sob w/ stairs , yard work, and ok with household chores,  does not do any time of walking or exercise   History of cerebrovascular accident (CVA) with residual deficit 12/31/2014   admission in epic;   right thalamic infarct second SVD residual LUE / LLE numb/ ting with sensory deficit   History of eye trauma    02-23-2023  per pt eye injury age late 11s , never seek medical attention,  vision became progressive worse over time;  then pt had stroke 01/ 2020 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,  s/p eye removal 02/ 2024   History of ischemic stroke 05/09/2015   admission in epic;   acute ischemia right ventral pons,  pt was taking his plavix    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 gretta;   taking pletal  twice daily   Leucocytosis    Numbness and tingling of left arm and leg    stroke residual,  sensory deficit resolve   Type 2 diabetes mellitus (HCC)    followed by pcp;   (02-23-2023  per pt has Libre 3,  blood sugar check multiple times daily,  stated average fasting sugar 120s,  stopped doing lantus  daily approx 09/ 2024,  only taking jardiance  daily and weely ozempic )   Vision abnormalities    Vitamin D  deficiency 12/2019   Wears glasses     Assessment: Patient Reported Symptoms:  Cognitive Cognitive Status: Alert and oriented to person, place, and time, Difficulties with attention and concentration Cognitive/Intellectual Conditions Management [RPT]: None reported or documented in medical history or problem list   Health Maintenance Behaviors: Stress management  Neurological Neurological Review of Symptoms: Dizziness, Headaches, Weakness Neurological Management Strategies: Coping strategies  HEENT HEENT Symptoms Reported: Sudden change or loss of vision HEENT Management Strategies: Coping strategies    Cardiovascular Cardiovascular Symptoms Reported: Dizziness, Fatigue Does patient have uncontrolled Hypertension?: No Cardiovascular Management Strategies: Coping strategies  Respiratory Respiratory Symptoms Reported: Wheezing, Shortness of breath Additional Respiratory Details: concerned about mold issues in apartment Respiratory Management Strategies: Coping strategies  Endocrine Endocrine Symptoms Reported: Shortness of breath, Blurry vision, Weakness or fatigue, Headaches    Gastrointestinal Gastrointestinal Symptoms Reported: Incontinence Gastrointestinal Management Strategies: Coping strategies  Genitourinary Genitourinary Symptoms Reported: Frequency Genitourinary Management Strategies: Coping strategies  Integumentary Integumentary Symptoms Reported: No symptoms reported Skin Management Strategies: Coping strategies  Musculoskeletal Musculoskelatal  Symptoms Reviewed: Muscle pain, Back pain, Difficulty walking, Weakness Musculoskeletal Management Strategies: Coping strategies      Psychosocial Psychosocial Symptoms Reported: Sadness - if selected complete PHQ 2-9, Depression - if selected complete PHQ 2-9, Anxiety - if selected complete GAD Behavioral Management Strategies: Coping strategies Major Change/Loss/Stressor/Fears (CP): Medical condition, self Techniques to Cope with Loss/Stress/Change: Counseling Quality of Family Relationships: unable to assess Housing stress (mold issues; talking with landlord about mold treatment)    01/18/2024    PHQ2-9 Depression Screening   Little interest or pleasure in doing things Several days  Feeling down, depressed, or hopeless Several days  PHQ-2 - Total Score 2  Trouble falling or staying asleep, or sleeping too much Several days  Feeling tired or having little energy Several days  Poor appetite or overeating  Several days  Feeling bad about yourself - or that you are a failure or have let yourself or your family down Several days  Trouble concentrating on things, such as reading the newspaper or watching television Several days  Moving or speaking so slowly that other people could have noticed.  Or the opposite - being so fidgety or restless that you have been moving around a lot more than usual Several days  Thoughts that you would be better off dead, or hurting yourself in some way Not at all  PHQ2-9 Total Score 8  If you checked off any problems, how difficult have these problems made it for you to do your work, take care of things at home, or get along with other people Somewhat difficult  Depression Interventions/Treatment Counseling    Vitals:   BP in normal range , per client information   Medications Reviewed Today     Reviewed by Frances Ozell GORMAN KEN (Social Worker) on 01/18/24 at 1337  Med List Status: <None>   Medication Order Taking? Sig Documenting Provider Last Dose  Status Informant  Accu-Chek Softclix Lancets lancets [id number] Yes Use in the morning and at bedtime Nichols, Tonya S, NP  Active Self, Pharmacy Records  acetaminophen  (TYLENOL ) 500 MG tablet [id number] Not taking  Take 2 tablets (Total= 1,000 mg), by mouth, 2 times a day as needed.  Patient not taking: Reported on 01/18/2024   Jegede, Olugbemiga E, MD  Active Self, Pharmacy Records  amLODipine  (NORVASC ) 10 MG tablet [id number] Yes Take 1 tablet (10 mg total) by mouth daily. Oley Bascom GORMAN, NP  Active   aspirin  325 MG tablet [id number] Yes Take 1 tablet (325 mg total) by mouth daily. Oley Bascom GORMAN, NP  Active   atorvastatin  (LIPITOR) 40 MG tablet [id number] Yes Take 1 tablet (40 mg total) by mouth daily. Oley Bascom GORMAN, NP  Active   benzonatate  (TESSALON ) 200 MG capsule [id number] unknown Take 1 capsule (200 mg total) by mouth 3 (three) times daily as needed for cough. Rai, Nydia POUR, MD  Active   Blood Glucose Monitoring Suppl (ACCU-CHEK GUIDE) w/Device KIT [id number] Not taking  Use to test blood sugar 3 times daily  Patient not taking: Reported on 01/18/2024   Rai, Nydia POUR, MD  Active   carvedilol  (COREG ) 12.5 MG tablet [id number] Yes Take 1 tablet (12.5 mg total) by mouth 2 (two) times daily with a meal. Oley Bascom GORMAN, NP  Active   cilostazol  (PLETAL ) 100 MG tablet [id number] Yes Take 1 tablet (100  mg total) by mouth 2 (two) times daily before a meal. [name], NP  Active   Continuous Glucose Sensor (FREESTYLE LIBRE 3 SENSOR) [place] [id number] Not taking  Place 1 sensor on the skin every 14 days. Use to check glucose continuously  Patient not taking: Reported on 01/18/2024   Paseda, Folashade R, FNP  Active Self, Pharmacy Records  dicyclomine  (BENTYL ) 20 MG tablet [id number] Not taking Take 1 tablet (20 mg total) by mouth 2 (two) times daily as needed for spasms.  Patient not taking: Reported on 01/18/2024   Small, Lyle CROME, PA  Active Self, Pharmacy Records  food thickener  (SIMPLYTHICK, HONEY/LEVEL ADONNA HONG,) LIQD [id number] Yes Take 10 packets by mouth as needed. Rai, Nydia POUR, MD  Active   Glucose Blood (BLOOD GLUCOSE TEST STRIPS) STRP [id number] Yes Use 3 (three) times daily as directed to check blood sugar. Davia Nydia POUR, MD  Active     Discontinued 06/22/22 1418 guaiFENesin -dextromethorphan  (ROBITUSSIN DM) 100-10 MG/5ML syrup [id number] Not taking  Take 5 mLs by mouth every 4 (four) hours as needed for cough.  Patient not taking: Reported on 01/18/2024   Davia Nydia POUR, MD  Active   insulin  aspart (NOVOLOG ) 100 UNIT/ML FlexPen [id number] Yes Inject 4 Units into the skin 3 (three) times daily with meals. Only take if eating a meal AND Blood Glucose (BG) is 80 or higher. Rai, Nydia POUR, MD  Active   insulin  glargine (LANTUS  SOLOSTAR) 100 UNIT/ML Solostar Pen [id number] Yes Inject 12 Units into the skin at bedtime. Rai, Ripudeep POUR, MD  Active   Insulin  Pen Needle (PEN NEEDLES) 31G X 5 MM MISC [id number] Yes Use 3 times daily as directed Rai, Ripudeep K, MD  Active   Lancets MISC [id number] Yes Use 3 (three) times daily as directed to check blood sugar. Rai, Nydia POUR, MD  Active   Lancets Misc. (ACCU-CHEK SOFTCLIX LANCET DEV) KIT [id number] Yes Use 3 times daily Rai, Ripudeep K, MD  Active   lisinopril  (ZESTRIL ) 40 MG tablet [id number] Yes Take 1 tablet (40 mg total) by mouth daily. [name], NP  Active   loperamide  (IMODIUM  A-D) 2 MG tablet [id number] Not taking  Take 1 tablet (2 mg total) by mouth 4 (four) times daily as needed for diarrhea or loose stools.  Patient not taking: Reported on 01/18/2024   [name], NP  Active Self, Pharmacy Records  ondansetron  (ZOFRAN -ODT) 4 MG disintegrating tablet [id number] Not taking  Take 1 tablet (4 mg total) by mouth every 8 (eight) hours as needed for nausea or vomiting.  Patient not taking: Reported on 01/18/2024   Davia Nydia POUR, MD  Active   pantoprazole  (PROTONIX ) 40 MG tablet [id number] Yes Take 1  tablet (40 mg total) by mouth daily at 12 noon. [name], NP  Active             Recommendation:   PCP Follow-up Continue Current Plan of Care Take medications as prescribed Call LCSW as needed for SW support Call rep from [facility] to ask about vision support services for client  Follow Up Plan:   Telephone follow-up with LCSW on 03/06/24 at 11:00 AM   Glendia Pear  MSW, LCSW [place]/Value Based Care [facility] Licensed Clinical Social Worker Direct Dial:  [phone] Fax:  [phone] Website:  [email or web address]

## 2024-01-18 NOTE — Patient Instructions (Signed)
 Visit Information  Thank you for taking time to visit with me today. Please don't hesitate to contact me if I can be of assistance to you before our next scheduled appointment.  Our next appointment is by telephone on 03/06/24 at 11:00 AM   Please call the care guide team at [phone] if you need to cancel or reschedule your appointment.   Following is a copy of your care plan:   Goals Addressed             This Visit's Progress    VBCI Social Work Care Plan       Problems:   Transport needs occasionally              Pain issues             Food scarcity issues. Gets some food help occasionally from local food pantry             Decreased family support             Walking challenges ;has to take rest breaks when walking. Discussed possible use of a cane              ADLs completion challenges              Housing needs (mold issues; he is talking with landlord about mold treatment)              Depression issues  CSW Clinical Goal(s):    Over the next 30 days the Patient will attend all scheduled medical appointments as evidenced by patient report and care team review of appointment completion in electronic MEDICAL RECORD NUMBER .            Over the next 30 days patient will research community resources of help to client  AEB patient contacting several community agencies for possible help for client (discussed his contacting food pantries,  discussed his contacting Medicaid for transport help)  Interventions:  Discussed medication procurement for client              Discussed ADLs completion. Client said it takes him longer to complete ADLs             Client said he has a home health aide that helps him daily for 2 hours daily in the home .  Home Health aide helps him complete forms as needed and sometimes helps him with some transport needs             Discussed mobility challenges.He takes rest breaks when walking . He said he may need to get a cane. Discussed fact that he  fell in a store a few weeks ago              Client  said  he plans to call [facility] to inquire about possible support.             Discussed Stroke Warriers program through [facility] and encouraged client to contact that program through [facility] . He said he would contact Stroke Warriers program at [facility]             Discussed food needs. He said aide helped him complete form to renew Food Stamps benefit and he is getting ready to send in [employer] renewal application.  He also gets some support from a local food pantry              Encouraged client to  contact office of NP and discuss with nurse his current needs, for example, his thoughts about whether he needs a cane or not to help him with walking.  He is scheduled for appointment with NP on Thursday of this week               Provided counseling support for client               Discussed pain issues of client.                Discussed sleeping issues. Client said he sleeps some at night and sleeps some during the day               Discussed mood issues of client. He is sad sometimes thinking about his current housing situation and trying to plan for upcoming housing needs. He may get sad related to financial needs. He said he plans to call landlord again to ask for help in addressing mold issues in the home              Discussed transport needs of client               Competed GAD-7 and PHQ 2/9               Encouraged client to call LCSW as needed for SW support at [phone]                Patient Goals/Self-Care Activities:  Take medications as prescribed              Attend scheduled medical appointments as needed              Allow time for ADLs completion              Call LCSW as needed for SW support at [phone]              Contact rep for [facility] to inquire about vision support help possibly through that agency        Plan:   Telephone follow up appointment with care  management team member scheduled for:  03/06/24 at 11;00 AM        Please go to [facility] [address] [place] [id number]) if you are experiencing a Mental Health or Behavioral Health Crisis or need someone to talk to.  The patient verbalized understanding of instructions, educational materials, and care plan provided today and DECLINED offer to receive copy of patient instructions, educational materials, and care plan.    Glendia Pear  MSW, LCSW [place] Licensed Clinical Social Worker Direct Dial:  [phone] Fax:  [phone] Website:  [email or web address]

## 2024-01-19 DIAGNOSIS — I63311 Cerebral infarction due to thrombosis of right middle cerebral artery: Secondary | ICD-10-CM | POA: Diagnosis not present

## 2024-01-20 ENCOUNTER — Ambulatory Visit: Payer: Self-pay | Admitting: Nurse Practitioner

## 2024-01-20 DIAGNOSIS — I63311 Cerebral infarction due to thrombosis of right middle cerebral artery: Secondary | ICD-10-CM | POA: Diagnosis not present

## 2024-01-21 DIAGNOSIS — I63311 Cerebral infarction due to thrombosis of right middle cerebral artery: Secondary | ICD-10-CM | POA: Diagnosis not present

## 2024-01-22 DIAGNOSIS — I63311 Cerebral infarction due to thrombosis of right middle cerebral artery: Secondary | ICD-10-CM | POA: Diagnosis not present

## 2024-01-23 DIAGNOSIS — I63311 Cerebral infarction due to thrombosis of right middle cerebral artery: Secondary | ICD-10-CM | POA: Diagnosis not present

## 2024-01-25 DIAGNOSIS — I63311 Cerebral infarction due to thrombosis of right middle cerebral artery: Secondary | ICD-10-CM | POA: Diagnosis not present

## 2024-01-26 DIAGNOSIS — I63311 Cerebral infarction due to thrombosis of right middle cerebral artery: Secondary | ICD-10-CM | POA: Diagnosis not present

## 2024-01-27 ENCOUNTER — Other Ambulatory Visit (HOSPITAL_COMMUNITY): Payer: Self-pay

## 2024-01-27 ENCOUNTER — Other Ambulatory Visit: Payer: Self-pay

## 2024-01-27 ENCOUNTER — Encounter (HOSPITAL_COMMUNITY): Payer: Self-pay

## 2024-01-27 DIAGNOSIS — I63311 Cerebral infarction due to thrombosis of right middle cerebral artery: Secondary | ICD-10-CM | POA: Diagnosis not present

## 2024-01-28 ENCOUNTER — Other Ambulatory Visit: Payer: Self-pay

## 2024-01-28 DIAGNOSIS — I63311 Cerebral infarction due to thrombosis of right middle cerebral artery: Secondary | ICD-10-CM | POA: Diagnosis not present

## 2024-01-29 DIAGNOSIS — I63311 Cerebral infarction due to thrombosis of right middle cerebral artery: Secondary | ICD-10-CM | POA: Diagnosis not present

## 2024-01-30 DIAGNOSIS — I63311 Cerebral infarction due to thrombosis of right middle cerebral artery: Secondary | ICD-10-CM | POA: Diagnosis not present

## 2024-01-31 ENCOUNTER — Other Ambulatory Visit: Payer: Self-pay

## 2024-02-03 ENCOUNTER — Other Ambulatory Visit: Payer: Self-pay

## 2024-02-04 ENCOUNTER — Other Ambulatory Visit: Payer: Self-pay

## 2024-02-07 ENCOUNTER — Other Ambulatory Visit: Payer: Self-pay

## 2024-02-08 ENCOUNTER — Telehealth: Payer: Self-pay

## 2024-02-08 ENCOUNTER — Ambulatory Visit: Payer: Self-pay

## 2024-02-08 DIAGNOSIS — R3981 Functional urinary incontinence: Secondary | ICD-10-CM | POA: Diagnosis not present

## 2024-02-08 DIAGNOSIS — E119 Type 2 diabetes mellitus without complications: Secondary | ICD-10-CM | POA: Diagnosis not present

## 2024-02-08 NOTE — Progress Notes (Deleted)
 02/08/2024 Name: Frank Schneider MRN: [id number] DOB: 02/13/1976  No chief complaint on file.   Frank Schneider is a 48 y.o. year old male who presented for a telephone visit.   They were referred to the pharmacist by their PCP for assistance in managing diabetes. PMH includes CVA (2016, 2020), PAD, T2DM, tobacco dependence, HLD,    Subjective: Patient was last seen by PCP, [name], NP, on 10/20/23. At last visit, BP was 123/80 mmHg, HR 68. Patient had recently been hospitalized from 08/29/23 to 09/03/23 for SIRS with gastritis (possible gastoenteritis vs gastroparesis due to Ozempic ). He was discharged on a liquid diet and told to hold Ozempic . He has only been taking Lantus  and Novolog  since discharge. His labwork from PCP appt on 10/20/23 was stable, but BG on BMP was 42 mg/dL.   Today, patient reports doing ok. No specific complaints at this time. He has not had any recurrent s/sx of nausea or vomiting over the past month.   A1C CGM Due for lipid panel and UACR   Care Team: Primary Care Provider: [name], NP ; Next Scheduled Visit: 01/20/24  Medication Access/Adherence  Current Pharmacy:  [facility] - [facility] [address] [place] [address] Phone: [phone] Fax: [phone]  [facility] - [place], [place] - [address] [place] [address] Phone: [phone] Fax: [phone]   Patient reports affordability concerns with their medications: No  Patient reports access/transportation concerns to their pharmacy: No  Patient reports adherence concerns with their medications:  No  - denies missed doses of Lantus  or Novolog . He is a [place] compliance packaging patient.   Diabetes:  Current medications: Lantus  (insulin  glargine) 14 units daily, Novolog  (insulin  aspart) 4 units before each meal (typically 2x per day) Medications tried in the past: Ozempic   Denies any recent GI upset  like nausea or vomiting.  Current glucose readings:  Using Accu Chek Guide meter; testing every day (fasting) 11/04/23: 220 mg/dL 2/0/74: 854 mg/dL 05/28/72: 819 mg/dL 05/29/72: 880 mg/dL 05/30/72: 883 mg/dL 05/31/72: 80 mg/dL 06/01/72: 883 mg/dL   He does have some Freestyle Libre 3 sensors left at home, but has not been able to connect them to his phone. He was last connected to LibreView on 08/26/23.    Discussed that his last BMP BG was 42 mg/dL, but he did not remember having any specific symptoms at this time.   Patient denies hypoglycemic s/sx including dizziness, shakiness, sweating. Patient denies hyperglycemic symptoms including polyuria, polydipsia, polyphagia, nocturia, neuropathy, blurred vision.  Current meal patterns: Usually eats 2 meals per day.  Hypertension:  Current medications: amlodipine  10 mg daily, carvedilol  12.5 mg BID, lisinopril  40 mg daily  Patient has a validated, automated, upper arm home BP cuff - home health nurse checks once/week.  Current blood pressure readings readings: Does not recall specific readings, but reports it has been normal  Patient denies hypotensive s/sx including dizziness, lightheadedness.  Patient denies hypertensive symptoms including headache, chest pain, shortness of breath   Hyperlipidemia/ASCVD Risk Reduction  Current lipid lowering medications: atorvastatin  40 mg daily  Antiplatelet regimen: aspirin  325 mg daily, cilostazol  100 mg BID (Rx expired 09/03/22)  ASCVD History: stroke, PAD Risk Factors: tobacco use, known ASCVD, T2DM  Clinical ASCVD: Yes  The ASCVD Risk score (Arnett DK, et al., 2019) failed to calculate for the following reasons:   Risk score cannot be calculated because patient has a medical history suggesting prior/existing ASCVD  Objective:  BP Readings from Last 3 Encounters:  10/20/23 123/80  09/03/23 (!) 159/72  03/24/23 (!) 149/77    Lab Results  Component Value Date   HGBA1C 8.9 (H) 08/31/2023    HGBA1C 9.0 (H) 08/29/2023   HGBA1C 7.4 (A) 01/07/2023       Latest Ref Rng & Units 10/20/2023    2:56 PM 09/03/2023    5:23 AM 09/02/2023    3:41 AM  BMP  Glucose 70 - 99 mg/dL 42  815  823   BUN 6 - 24 mg/dL 11  9  10    Creatinine 0.76 - 1.27 mg/dL 9.04  9.17  9.18   BUN/Creat Ratio 9 - 20 12     Sodium 134 - 144 mmol/L 142  137  137   Potassium 3.5 - 5.2 mmol/L 4.1  4.1  3.9   Chloride 96 - 106 mmol/L 104  103  103   CO2 20 - 29 mmol/L 22  27  28    Calcium  8.7 - 10.2 mg/dL 9.9  8.5  8.6     Lab Results  Component Value Date   CHOL 88 (L) 06/22/2022   HDL 30 (L) 06/22/2022   LDLCALC 43 06/22/2022   TRIG 380 (H) 08/30/2023   CHOLHDL 2.9 06/22/2022    Medications Reviewed Today   Medications were not reviewed in this encounter       Assessment/Plan:   Diabetes: - Currently uncontrolled with most recent A1C of 8.9% above goal <7%. Medication adherence appears optimal. Patient reported FBG are variable. Hesitant to increase insulin  dose today with some FBG at goal and recent BG on BMP of 42 mg/dL. Would prefer to patient restart wearing CGM to detect whether he is having any hypoglycemia. He is amenable to coming in person for CGM setup. Unclear whether GI symptoms leading to hospitalization in May 2025 were related to GLP-1RA, but given current glycemic control and severity of episode, will not restart at this time.   - Last UACR of 13 mg/g on 06/22/22 - Patient denies personal or family history of multiple endocrine neoplasia type 2, medullary thyroid  cancer; personal history of pancreatitis or gallbladder disease. - Reviewed long term cardiovascular and renal outcomes of uncontrolled blood sugar - Reviewed goal A1c, goal fasting, and goal 2 hour post prandial glucose - Reviewed hypoglycemia management plan and the rule of 15 - Reviewed dietary modifications including  utilizing the healthy plate method, limiting portion size of carbohydrate foods, increasing intake of  protein and non-starchy vegetables. Counseled patient to stay hydrated with water throughout the day. - Recommend to continue Lantus  14 units daily and Novolog  4 units before each meal (typically twice daily)  - Recommend to check glucose twice daily: fasting and 2-hr PPG continuously using FL3+ CGM. Educated patient to bring supplies for setup at next appointment. In the meantime, continuing monitoring BG before each meal.  - Next A1C due 12/01/2023    Hypertension: - Currently controlled with last clinic BP below goal less than 130/80. Patient is not having s/sx of hypo- or hyper-tension. Medication adherence appears appropriate.  - Reviewed long term cardiovascular and renal outcomes of uncontrolled blood pressure - Reviewed appropriate blood pressure monitoring technique and reviewed goal blood pressure. Recommended to check home blood pressure and heart rate once weekly with home health nurse.  - Recommend to continue amlodipine  10 mg daily, carvedilol  12.5 mg BID, lisinopril  40 mg daily    Hyperlipidemia/ASCVD Risk Reduction: - Currently controlled with most recent  LDL-C of 43 mg/g below goal < 55 mg/dL given premature and progressive ASCVD. High intensity statin indicated. Noted that recent TG were elevated to 380 mg/dL. Expect improvement with continued adherence to insulin  and improved glycemic control. Noted that prescription for cilostazol  expired in May, which patient was taking for claudication. He does not have any contraindications to continued therapy. Will request that PCP provides refill so this can be included in the patient's next compliance packaging  - Reviewed long term complications of uncontrolled cholesterol - Recommend to continue atorvastatin  40 mg daily.  - Recommend to continue aspirin  325 mg daily - Recommend to restart cilostazol  100 mg BID. Will collaborate with PCP to place orders.    Follow Up Plan:  Pharmacist in person 12/01/23 PCP clinic visit  01/20/24   Lorain Baseman, PharmD [facility] [phone]

## 2024-02-08 NOTE — Telephone Encounter (Signed)
 Attempted to contact patient for scheduled appointment for medication management. Left HIPAA compliant message for patient to return my call at their convenience.   Lorain Baseman, PharmD [facility] [phone]

## 2024-02-10 DIAGNOSIS — I63311 Cerebral infarction due to thrombosis of right middle cerebral artery: Secondary | ICD-10-CM | POA: Diagnosis not present

## 2024-02-11 DIAGNOSIS — I63311 Cerebral infarction due to thrombosis of right middle cerebral artery: Secondary | ICD-10-CM | POA: Diagnosis not present

## 2024-02-12 DIAGNOSIS — I63311 Cerebral infarction due to thrombosis of right middle cerebral artery: Secondary | ICD-10-CM | POA: Diagnosis not present

## 2024-02-14 ENCOUNTER — Telehealth: Payer: Self-pay | Admitting: *Deleted

## 2024-02-14 DIAGNOSIS — I63311 Cerebral infarction due to thrombosis of right middle cerebral artery: Secondary | ICD-10-CM | POA: Diagnosis not present

## 2024-02-14 NOTE — Progress Notes (Signed)
 Complex Care Management Care Guide Note  02/14/2024 Name: Frank Schneider MRN: [id number] DOB: Apr 21, 1976  [name] is a 48 y.o. year old male who is a primary care patient of [name], NP and is actively engaged with the care management team. I reached out to Maryalice Coup by phone today to assist with re-scheduling  with the Pharmacist.  Follow up plan: Unsuccessful telephone outreach attempt made. A HIPAA compliant phone message was left for the patient providing contact information and requesting a return call.  Harlene Satterfield  [facility]  Value-Based Care Institute, [facility] Guide  Direct Dial: [phone]  Fax [phone]

## 2024-02-14 NOTE — Progress Notes (Signed)
 Complex Care Management Care Guide Note  02/14/2024 Name: Frank Schneider MRN: [id number] DOB: 09-15-1975  Frank Schneider is a 48 y.o. year old male who is a primary care patient of [name], NP and is actively engaged with the care management team. I reached out to Maryalice Coup by phone today to assist with re-scheduling  with the Pharmacist.  Follow up plan: Face to Face appointment with complex care management team member scheduled for:  03/21/24  Harlene Satterfield  [facility]  [facility], [facility] Guide  Direct Dial: [phone]  Fax [phone]

## 2024-02-16 DIAGNOSIS — I63311 Cerebral infarction due to thrombosis of right middle cerebral artery: Secondary | ICD-10-CM | POA: Diagnosis not present

## 2024-02-17 DIAGNOSIS — I63311 Cerebral infarction due to thrombosis of right middle cerebral artery: Secondary | ICD-10-CM | POA: Diagnosis not present

## 2024-02-18 DIAGNOSIS — I63311 Cerebral infarction due to thrombosis of right middle cerebral artery: Secondary | ICD-10-CM | POA: Diagnosis not present

## 2024-02-19 DIAGNOSIS — I63311 Cerebral infarction due to thrombosis of right middle cerebral artery: Secondary | ICD-10-CM | POA: Diagnosis not present

## 2024-02-20 DIAGNOSIS — I63311 Cerebral infarction due to thrombosis of right middle cerebral artery: Secondary | ICD-10-CM | POA: Diagnosis not present

## 2024-02-21 DIAGNOSIS — I63311 Cerebral infarction due to thrombosis of right middle cerebral artery: Secondary | ICD-10-CM | POA: Diagnosis not present

## 2024-02-22 DIAGNOSIS — I63311 Cerebral infarction due to thrombosis of right middle cerebral artery: Secondary | ICD-10-CM | POA: Diagnosis not present

## 2024-02-23 DIAGNOSIS — I63311 Cerebral infarction due to thrombosis of right middle cerebral artery: Secondary | ICD-10-CM | POA: Diagnosis not present

## 2024-02-24 DIAGNOSIS — I63311 Cerebral infarction due to thrombosis of right middle cerebral artery: Secondary | ICD-10-CM | POA: Diagnosis not present

## 2024-02-25 DIAGNOSIS — I63311 Cerebral infarction due to thrombosis of right middle cerebral artery: Secondary | ICD-10-CM | POA: Diagnosis not present

## 2024-02-26 DIAGNOSIS — I63311 Cerebral infarction due to thrombosis of right middle cerebral artery: Secondary | ICD-10-CM | POA: Diagnosis not present

## 2024-02-27 DIAGNOSIS — I63311 Cerebral infarction due to thrombosis of right middle cerebral artery: Secondary | ICD-10-CM | POA: Diagnosis not present

## 2024-02-28 ENCOUNTER — Ambulatory Visit: Payer: Self-pay | Admitting: Nurse Practitioner

## 2024-02-28 DIAGNOSIS — I63311 Cerebral infarction due to thrombosis of right middle cerebral artery: Secondary | ICD-10-CM | POA: Diagnosis not present

## 2024-02-29 DIAGNOSIS — I63311 Cerebral infarction due to thrombosis of right middle cerebral artery: Secondary | ICD-10-CM | POA: Diagnosis not present

## 2024-03-01 DIAGNOSIS — I63311 Cerebral infarction due to thrombosis of right middle cerebral artery: Secondary | ICD-10-CM | POA: Diagnosis not present

## 2024-03-02 DIAGNOSIS — I63311 Cerebral infarction due to thrombosis of right middle cerebral artery: Secondary | ICD-10-CM | POA: Diagnosis not present

## 2024-03-03 ENCOUNTER — Ambulatory Visit: Attending: Nurse Practitioner

## 2024-03-03 DIAGNOSIS — M6281 Muscle weakness (generalized): Secondary | ICD-10-CM | POA: Insufficient documentation

## 2024-03-03 DIAGNOSIS — I63311 Cerebral infarction due to thrombosis of right middle cerebral artery: Secondary | ICD-10-CM | POA: Diagnosis not present

## 2024-03-03 DIAGNOSIS — R262 Difficulty in walking, not elsewhere classified: Secondary | ICD-10-CM | POA: Insufficient documentation

## 2024-03-03 DIAGNOSIS — R2681 Unsteadiness on feet: Secondary | ICD-10-CM | POA: Insufficient documentation

## 2024-03-03 DIAGNOSIS — R293 Abnormal posture: Secondary | ICD-10-CM | POA: Insufficient documentation

## 2024-03-03 NOTE — Therapy (Signed)
 OUTPATIENT PHYSICAL THERAPY WHEELCHAIR EVALUATION   Patient Name: Frank Schneider MRN: [id number] DOB:February 06, 1976, 48 y.o., male Today's Date: 03/03/2024  END OF SESSION:  PT End of Session - 03/03/24 1252     Visit Number 1    Number of Visits 1    Authorization Type [employer]    PT Start Time 1311    PT Stop Time 1336    PT Time Calculation (min) 25 min    Activity Tolerance Patient tolerated treatment well    Behavior During Therapy Flat affect          Past Medical History:  Diagnosis Date   Anticoagulated    ASA 325 mg--- ;managed by pcp  (stroke prevention)   Beta thalassemia trait    hematology/ oncologist--- dr lanny (arnetta in epic 11-11-2022  elevated A2   Chronic foot pain    Diabetic neuropathy (HCC)    DOE (dyspnea on exertion)    02-23-2023  per pt sob w/ stairs , yard work, and ok with household chores,  does not do any time of walking or exercise   History of cerebrovascular accident (CVA) with residual deficit 12/31/2014   admission in epic;   right thalamic infarct second SVD residual LUE / LLE numb/ ting with sensory deficit   History of eye trauma    02-23-2023  per pt eye injury age late 30s , never seek medical attention,  vision became progressive worse over time;  then pt had stroke 01/ 2020 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,  s/p eye removal 02/ 2024   History of ischemic stroke 05/09/2015   admission in epic;   acute ischemia right ventral pons,  pt was taking his plavix    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 gretta;  taking pletal  twice daily   Leucocytosis    Numbness and tingling of left arm and leg    stroke residual,  sensory deficit resolve   Type 2 diabetes mellitus (HCC)    followed by pcp;   (02-23-2023  per pt has Libre 3,  blood sugar check multiple times daily,  stated average fasting sugar 120s,  stopped doing lantus  daily approx 09/ 2024,  only taking jardiance  daily and weely ozempic )   Vision abnormalities    Vitamin D  deficiency 12/2019   Wears glasses    Past Surgical History:  Procedure Laterality Date   CATARACT EXTRACTION W/ INTRAOCULAR LENS IMPLANT Right 05/27/2016   EVISCERATION Right 06/11/2022   Procedure: EVISCERATION REPAIR WITH POSSIBLE INCLUSION;  Surgeon: Rodgers Faden, MD;  Location: [facility] OR;  Service: Ophthalmology;  Laterality: Right;   INCISION AND DRAINAGE PERIRECTAL ABSCESS  07/28/2011   Procedure: IRRIGATION AND DEBRIDEMENT PERIRECTAL ABSCESS;  Surgeon: Elon CHRISTELLA Pacini, MD;  Location: [facility] ORS;  Service: General;  Laterality: N/A;   of skin muscle and subcutaneous tissue of perimeum  8cmx12cm area  IR GENERIC HISTORICAL  01/08/2016   IR RADIOLOGIST EVAL & MGMT 01/08/2016 [facility] INTERV RAD   LAPAROSCOPIC APPENDECTOMY N/A 12/16/2015   Procedure: APPENDECTOMY LAPAROSCOPIC;  Surgeon: Vicenta Poli, MD;  Location: [facility];  Service: General;  Laterality: N/A;   PRIAPISM REPAIR     age 46s   (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)   Patient Active Problem List   Diagnosis Date Noted   Lactic acidosis 08/29/2023   Hypertensive urgency 08/29/2023   Abdominal pain 08/29/2023   Nausea and vomiting 08/29/2023   Atherosclerosis of native arteries of extremity with intermittent claudication 03/24/2022   Type 2 diabetes mellitus with hyperglycemia, without long-term  current use of insulin  (HCC) 01/30/2022   Vitamin D  deficiency 09/15/2021   Anxiety and depression 09/24/2019   Chronic left flank pain 09/24/2019   Dysphagia 04/30/2018   Ischemic stroke (HCC) 04/29/2018   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   Acute appendicitis 12/16/2015   Right cataract 10/04/2015   Tobacco dependence 10/04/2015   Absolute anemia 08/07/2015   Neuropathy 08/07/2015   Cerebrovascular accident (CVA) (HCC)    Leukocytosis    Stroke (cerebrum) (HCC) 05/09/2015   Type 2 diabetes mellitus with circulatory disorder (HCC) 03/12/2015   Hyperlipidemia 02/14/2015   History of CVA (cerebrovascular accident) 02/14/2015   CVA (cerebral vascular accident) (HCC) 01/01/2015   Diabetes mellitus type 2 with complications, uncontrolled 01/01/2015   Stroke (HCC)    Essential hypertension    Smoker    Tobacco use     PCP: [name], NP   REFERRING PROVIDER: [name], NP   THERAPY DIAG:  Unsteadiness on feet - Plan: PT plan of care cert/re-cert  Abnormal posture - Plan: PT plan of care cert/re-cert  Muscle weakness (generalized) - Plan: PT plan of care cert/re-cert  Difficulty in walking, not elsewhere classified - Plan: PT plan of care cert/re-cert  Rationale for Evaluation and Treatment Rehabilitation  SUBJECTIVE:                                                                                                                                                                                           SUBJECTIVE STATEMENT: Pt presents for wheelchair evaluation. He is interested in a power wheelchair. He reports significant pain in BLE related to neuropathy. He states that he is limited to taking a few steps at a time before he feels he needs to step.   PRECAUTIONS: Fall    WEIGHT BEARING RESTRICTIONS No  PLOF:  Independent  PATIENT GOALS: to get a power wheelchair      MEDICAL HISTORY:  Primary diagnosis onset:  2016/2017     Medical Diagnosis with ICD-10  code: I63.9- cerebral infarction, unspecified    [] Progressive disease  Relevant future surgeries:     Height: 5'10 Weight: 179lb Explain recent changes or trends in weight:      History:  Past Medical History:  Diagnosis Date   Anticoagulated    ASA 325 mg--- ;managed by pcp  (stroke prevention)   Beta thalassemia trait    hematology/ oncologist--- dr lanny (arnetta in epic 11-11-2022  elevated A2   Chronic foot pain    Diabetic neuropathy (HCC)    DOE (dyspnea on exertion)    02-23-2023  per pt sob w/ stairs , yard work, and ok with household chores,  does not do any time of walking or exercise   History of cerebrovascular accident (CVA) with residual deficit 12/31/2014   admission in epic;   right thalamic infarct second SVD residual LUE / LLE numb/ ting with sensory deficit   History of eye trauma    02-23-2023  per pt eye injury age late 61s , never seek medical attention,  vision became progressive worse over time;  then pt had stroke 01/ 2020 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,  s/p eye removal 02/ 2024   History of ischemic stroke 05/09/2015   admission in epic;   acute ischemia right ventral pons,  pt was taking his plavix    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 gretta;  taking pletal  twice daily   Leucocytosis    Numbness and tingling of left arm and leg    stroke residual,  sensory deficit  resolve   Type 2 diabetes mellitus (HCC)    followed by pcp;   (02-23-2023  per pt has Libre 3,  blood sugar check multiple times daily,  stated average fasting sugar 120s,  stopped doing lantus  daily approx 09/ 2024,  only taking jardiance  daily and weely ozempic )   Vision abnormalities    Vitamin D  deficiency 12/2019   Wears glasses        Cardio Status:  Functional Limitations:   [x] Intact  []  Impaired      Respiratory Status:  Functional Limitations:   [x] Intact  [] Impaired   [] SOB [] COPD [] O2 Dependent ______LPM  [] Ventilator Dependent  Resp equip:                                                     Objective Measure(s):   Orthotics:   [] Amputee:                                                             []   Prosthesis:      HOME ENVIRONMENT:  [] House [] Condo/town home [x] Apartment [] Asst living [] LTCF         [] Own  [x] Rent   [x] Lives alone [] Lives with others -                             Hours without assistance: fluctuates  [x] Home is accessible to patient            Has a CNA 7 days a week to assist with ADLs (including laundry, bathing, cooking, cleaning, etc)                     Storage of wheelchair:  [x] In home   [] Other Comments:      COMMUNITY :  TRANSPORTATION:  [x] Car [] Art Gallery Manager [] Adapted w/c Lift []  Ambulance [] Other:                     [] Sits in wheelchair during transport   Where is w/c stored during transport?  [] [place] [place]  []  [employer]  r   [] Self-Driver       Drive while in  Biomedical Scientist [] yes [] no   Employment and/or school:  Specific requirements pertaining to mobility        Other:  COMMUNICATION:  Verbal Communication  [] WFL [] receptive [] WFL [] expressive [x] Understandable  [] Difficult to understand  [] non-communicative  Primary Language:_______english _______ 2nd:_____________  Communication provided by:[x] Patient [] Family [] Caregiver [] Translator   [] Uses an augmentative communication device     Manufacturer/Model :      MOBILITY/BALANCE:  Sitting Balance  Standing Balance  Transfers  Ambulation   [x] WFL      [x] WFL but limited in time tolerating standing [x] Independent  []  Independent   [] Uses UE for balance in sitting Comments:  [] Uses UE/device for stability Comments:  []  Min assist  []  Ambulates independently with       device:___________________      []  Mod assist  []  Able to ambulate ______ feet        safely/functionally/independently   []  Min assist  []  Min assist  []  Max assist  []  Non-functional ambulator         History/High risk of falls   []  Mod assist  []  Mod assist  []  Dependent  []  Unable to ambulate   []  Max  assist  []  Max assist  Transfer method:[] 1 person [] 2 person [] sliding board [] squat pivot [] stand pivot [] mechanical patient lift  [] other:   []  Unable  []  Unable    Fall History: # of falls in the past 6 months? Multiple, weekly  # of "near" falls in the past 6 months? Frequent     CURRENT SEATING / MOBILITY:  Current Mobility Device: [x] None [] Cane/Walker [] Manual [] Dependent [] Dependent w/ Tilt rScooter  [] Power (type of control):   Manufacturer:  Model:  Serial #:   Size:  Color:  Age:   Purchased by whom:   Current condition of mobility base:    Current seating system:                                                                       Age of seating system:  Describe posture in present seating system:    Is the current mobility meeting medical necessity?:  [] Yes [] No Describe:    Ability to complete Mobility-Related Activities of Daily Living (MRADL's) with Current Mobility Device:   Move room to room  [x] Independent  [] Min [] Mod [] Max assist  [] Unable  Comments:   Meal prep  [] Independent  [] Min [x] Mod [] Max assist  [] Unable    Feeding  [x] Independent  [] Min [] Mod [] Max assist  [] Unable    Bathing  [] Independent  [] Min [x] Mod [] Max assist  [] Unable    Grooming  [] Independent  [] Min [x] Mod [] Max assist  [] Unable    UE dressing  [] Independent  [x] Min [] Mod [] Max assist   [] Unable    LE dressing  [] Independent   [x] Min [] Mod [] Max assist  [] Unable    Toileting  [] Independent  [x] Min [] Mod [] Max assist  [] Unable    Bowel Mgt: [x]  Continent []  Incontinent []  Accidents [x]  Diapers []  Colostomy []  Bowel Program:  Bladder Mgt: [x]  Continent []  Incontinent []  Accidents [x]  Diapers []  Urinal []  Intermittent Cath []  Indwelling Cath []  Supra-pubic Cath     Current Mobility Equipment Trialed/ Ruled Out:    Does not meet mobility needs due to:    Mark all boxes that indicate inability to use the specific equipment listed     Meets needs for safe  independent functional  ambulation  / mobility    Risk of  Falling or History of Falls    Enviromental limitations      Cognition    Safety concerns with  physical ability    Decreased / limitations endurance  & strength     Decreased / limitations  motor skills  & coordination    Pain    Pace /  Speed    Cardiac and/or  respiratory condition    Contra - indicated by diagnosis   Cane/Crutches  []   [x]   [x]   []   [x]   [x]   [x]   [x]   [x]   []   []    Walker / Rollator  []  NA   []   [x]   [x]   []   [x]   [x]   [x]   [x]   [x]   []   []     Manual Wheelchair [id number]:  []  NA  []   []   []   []   [x]   [x]   [x]   [x]   [x]   []   []    Manual W/C (K0005) with power assist  []  NA  []   []   []   []   []   []   []   []   []   []   []    Scooter  []  NA  []   []   []   []   []   []   []   []   []   []   []    Power Wheelchair: standard joystick  []  NA  [x]   []   []   []   []   []   []   []   []   []   []    Power Wheelchair: alternative controls  []  NA  []   []   []   []   []   []   []   []   []   []   []    Summary:  The least costly alternative for independent functional mobility was found to be:    []  Crutch/Cane  []  Walker []  Manual w/c  []  Manual w/c with power assist   []  Scooter   [x]  Power w/c std joystick   []  Power w/c alternative control        []  Requires dependent care mobility device   Cabin Crew  for [employer] skills are  adequate for safe mobility equipment operation  [x]   Yes []   No  Patient is willing and motivated to use recommended mobility equipment  [x]   Yes []   No       []  Patient is unable to safely operate mobility equipment independently and requires dependent care equipment Comments:           SENSATION and SKIN ISSUES:  Sensation []  Intact  [x]  Impaired []  Absent []  Hyposensate []  Hypersensate  []  Defensiveness  Location(s) of impairment: L hemibody    Pressure Relief Method(s):  []  Lean side to side to offload (without risk of falling)  []   W/C push up (4+ times/hour for 15+ seconds) []  Stand up (without risk of falling)    []  Other: (Describe): Effective pressure relief method(s) above can be performed consistently throughout the day: [] Yes  []  No If not, Why?:  Skin Integrity Risk:       [x]  Low risk           []  Moderate risk            []  High risk  If high risk, explain:   Skin Issues/Skin Integrity  Current skin Issues  []  Yes [x]  No []  Intact  []   Red area   []   Open area  []  Scar tissue  []  At risk from prolonged sitting  Where: History of Skin Issues  []  Yes [x]  No Where : When: Stage: Hx of skin flap surgeries  []  Yes [x]  No Where:  When:  Pain: [x]  Yes []  No   Pain Location(s): B LE knee and distal  Intensity scale: (0-10) : 5/10 How does pain interfere with mobility and/or MRADLs? - he is very limited in his standing tolerance and ambulation distance to complete his MRADLs    MAT EVALUATION:  Neuro-Muscular Status: (Tone, Reflexive, Responses, etc.)     []   Intact   []  Spasticity:  []  Hypotonicity  []  Fluctuating  []  Muscle Spasms  [x]  Poor Righting Reactions/Poor Equilibrium Reactions  []  Primal Reflex(s):    Comments:     5STS: 20.25 B UE  TUG: 15.59s no AD       COMMENTS:    POSTURE:     Comments:  Pelvis Anterior/Posterior:  []  Neutral   [x]  Posterior  []  Anterior  []  Fixed - No movement []  Tendency away from neutral [x]  Flexible [x]   Self-correction []  External correction Obliquity (viewed from front)  [x]  WFL []  R Obliquity []  L Obliquity  []  Fixed - No movement []  Tendency away from neutral []  Flexible []  Self-correction []  External correction Rotation  [x]  WFL []  R anterior []  L anterior  []  Fixed - No movement []  Tendency away from neutral []  Flexible []  Self-correction []  External correction Tonal Influence Pelvis:  [x]  Normal []  Flaccid []  Low tone []  Spasticity []  Dystonia []  Pelvis thrust []  Other:    Trunk Anterior/Posterior:  []  WFL [x]  Thoracic kyphosis []  Lumbar lordosis  []  Fixed - No movement []  Tendency away from neutral []  Flexible []  Self-correction []  External correction  [x]  WFL []  Convex to left  []  Convex to right []  S-curve   []  C-curve []  Multiple curves []  Tendency away from neutral []  Flexible []  Self-correction []  External correction Rotation of shoulders and upper trunk:  [x]  Neutral []  Left-anterior []  Right- anterior []  Fixed- no movement []  Tendency away from neutral []  Flexible []  Self correction []  External correction Tonal influence  Trunk:  [x]  Normal []  Flaccid []  Low tone []  Spasticity []  Dystonia []  Other:   Head & Neck  [x]  Functional [x]  Flexed    []  Extended []  Rotated right  []  Rotated left []  Laterally flexed right []  Laterally flexed left []  Cervical hyperextension   []  Good head control [x]  Adequate head control []  Limited head control []  Absent head control Describe tone/movement of head and neck:    Lower Extremity Measurements: LE MMT:  MMT Right 03/03/2024 Left 03/03/2024  Hip flexion 5 4  Hip extension    Hip abduction 5 5  Hip adduction 5 5  Knee flexion 5 4  Knee extension 5 5  Ankle dorsiflexion 5 4  Ankle plantarflexion     (Blank rows = not tested)  Hip positions:  [x]  Neutral   []  Abducted   []  Adducted  []  Subluxed   []  Dislocated   []  Fixed   []  Tendency away from neutral []  Flexible []   Self-correction []  External correction   Hip Windswept:[x]  Neutral  []  Right    []  Left  []  Subluxed   []  Dislocated   []  Fixed   []  Tendency away from neutral []  Flexible []  Self-correction []  External correction  LE Tone: [x]  Normal []  Low tone []  Spasticity []  Flaccid []  Dystonia []  Rocks/Extends at hip []  Thrust into knee extension []  Pushes legs downward into footrest   UE Measurements:   UPPER EXTREMITY MMT:  MMT Right 03/03/2024 Left 03/03/2024  Shoulder flexion    Shoulder abduction 5 5  Shoulder adduction    Elbow flexion 5 5  Elbow extension 5 5  Wrist flexion    Wrist extension    Pinch strength    Grip strength WFL Slightly decr  (Blank rows = not tested)  Shoulder Posture:  Right Tendency towards Left  []   Functional []    []   Elevation []    [x]   Depression [x]    [x]   Protraction [x]    []   Retraction []    [x]   Internal rotation [x]    []   External rotation []    []   Subluxed []      Wrist/Hand: Handedness: [x]  Right   []  Left   []  NA: Comments:  Right  Left  [x]   WNL []    []   Limitations []    []   Contractures []    []   Fisting []    []   Tremors []    []   Weak grasp []    []   Poor dexterity [x]    []   Hand movement non functional []    []   Paralysis []     MOBILITY BASE RECOMMENDATIONS and JUSTIFICATION:  MOBILITY BASE  JUSTIFICATION   Manufacturer:   [employer]:              [phone]                Color: black Seat Width:   Seat Depth    []  Manual mobility base (continue below)   []  Scooter/POV  [x]  Power mobility base   Number of hours per day spent in above selected mobility base: ~8-10 hours  Typical daily mobility base use Schedule: Patient will transfer into his wheelchair to participate in his MRADLs.    [x]  is not a safe, functional ambulator  [x]  limitation prevents from completing a MRADL(s) within a reasonable time frame    [x]  limitation places at high risk of morbidity or mortality secondary to  the attempts to perform a  MRADL(s)  []  limitation prevents accomplishing a MRADL(s) entirely  [x]  provide independent mobility  [x]  equipment is a lifetime medical need  [x]  walker or cane inadequate  [x]  any type manual wheelchair      inadequate  [x]  scooter/POV inadequate      []  requires dependent mobility          POWER MOBILITY      []  Scooter/POV    []  can safely operate   []  can safely transfer   []  has adequate trunk stability   []  cannot functionally propel  manual wheelchair    [x]  Power mobility base    []  non-ambulatory   [x]  cannot functionally propel manual wheelchair   [x]  cannot functionally and safely      operate scooter/POV  [x]  can safely operate power       wheelchair  [x]  home is accessible  [x]  willing to use power wheelchair     Tilt  []  Powered tilt on powered chair  []  Powered tilt on manual chair  []  Manual tilt on manual chair Comments:  []  change position for pressure      []  elief/cannot weight shift   []  change position against      gravitational force on head and      shoulders   []  decrease pain  []  blood pressure management   []  control autonomic dysreflexia  []  decrease respiratory distress  []  management of spasticity  []  management of low tone  []  facilitate postural control   []  rest periods   []  control edema  []  increase sitting tolerance   []  aid with transfers       []  Upgraded electronics controller/harness    []  Single power (tilt or recline)   []  Expandable    []  Non-expandable plus   []  Multi-power (tilt, recline, power legrest, power seat lift, vertical positioning system, stand)  []  allows input device to communicate with drive motors  []  harness provides necessary connections between the controller, input device, and seat functions     []  needed in order to operate power seat functions through joystick/ input device  []  required for alternative drive controls     []  Enhanced display  []  required to connect all alternative drive  controls   []  required for upgraded joystick      (lite-throw, heavy duty, micro)  []  Allows user to see in which mode and drive the wheelchair is set; necessary for alternate controls       []  Upgraded tracking electronics  []  correct tracking when on uneven surfaces makes switch driving more efficient and less fatiguing  []  increase safety when driving  []  increase ability to traverse thresholds    []  Safety / reset / mode switches     Type:    []  Used to change modes and stop the wheelchair when driving     [x]  [facility] for joystick / input device/switches  [x]  swing away for access or transfers   [x]  attaches joystick / input device / switches to wheelchair   []  provides for consistent access  []  midline for optimal placement    []  Attendant controlled joystick plus     mount  []  safety  []  long distance driving  []  operation of seat functions  []  compliance with transportation regulations    [x]  Battery  [x]  required to power (power assist / scooter/ power wc / other):   []  Power inverter (24V to 12V)  []  required for  ventilator / respiratory equipment / other:     CHAIR OPTIONS MANUAL & POWER      Armrests   [x]  adjustable height []  removable  []  swing away []  fixed  [x]  flip back  []  reclining  [x]  full length pads []  desk []  tube arms []  gel pads  [x]  provide support with elbow at 90    [x]  remove/flip back/swing away for  transfers  [x]  provide support and positioning of upper body    []  allow to come closer to table top  []  remove for access to tables  []  provide support for w/c tray  []  change of height/angles for variable activities   []  Elbow support / Elbow stop  []  keep elbow positioned on arm pad  []  keep arms from falling off arm pad  during tilt and/or recline   Upper Extremity Support  []  Arm trough  []   R  []   L  Style:  []  swivel mount []  fixed mount   []  posterior hand support  []   tray  []  full tray  []  joystick cut out  []   R  []   L  Style:  []  decrease  gravitational pull on      shoulders  []  provide support to increase UE  function  []  provide hand support in natural    position  []  position flaccid UE  []  decrease subluxation    []  decrease edema       []  manage spasticity   []  provide midline positioning  []  provide work surface  []  placement for AAC/ Computer/ EADL     Component  Justification   []  Back       []  provide posterior trunk support []  facilitate tone  []  provide lumbar/sacral support []  accommodate deformity  []  support trunk in midline   []  custom required "off-the-shelf" back support will not accommodate deformity   []  provide lateral trunk support []  accommodate or decrease tone            []  Back mounts  []  fixed  []  removable  []  attach back rest/cushion to wheelchair frame   []  Lateral trunk      supports  []  R []  L  []  decrease lateral trunk leaning  []  accommodate asymmetry    []  contour for increased contact  []  safety    []  control of tone    []  Lateral trunk      supports mounts  []  fixed  []  swing-away   []  removable  []  mounts lateral trunk supports     []  Mounts lateral trunk supports swing-away or removable for transfers   []  Anterior chest      strap, vest     []  decrease forward movement of shoulder  []  decrease forward movement of trunk  []  safety/stability  []  added abdominal support  []  trunk alignment  []  assistance with shoulder control   []  decrease shoulder elevation    []  Headrest      []  provide posterior head support  []  provide posterior neck support  []  provide lateral head support  []  provide anterior head support  []  support during tilt and recline  []  improve feeding     []  improve respiration  []  placement of switches  []  safety    []  accommodate ROM   []  accommodate tone  []  improve visual orientation   []  Headrest           []  fixed []  removable []  flip  down      Mounting hardware   []  swing-away laterals/switches  []  mount headrest   []  mounts headrest flip  down or  removable for transfers  []  mount headrest swing-away laterals   []  mount switches     []  Neck Support    []  decrease neck rotation  []  decrease forward neck flexion   Pelvic Positioner    []  std hip belt          []  padded hip belt  []  dual pull hip belt  []  four point hip belt  []  stabilize tone  []  decrease falling out of chair  []  prevent excessive extension  []  special pull angle to control      rotation  []  pad for protection over boney   prominence  []  promote comfort    []  Essential needs        bag/pouch   []  medicines []  special food rorthotics []  clothing changes  []  diapers  []  catheter/hygiene []  ostomy supplies   The above equipment has a life- long use expectancy.  Growth and changes in medical and/or functional conditions would be the exceptions.   SUMMARY:  Why mobility device was selected; include why a lower level device is not appropriate:    ASSESSMENT:  CLINICAL IMPRESSION: Patient is a 48 y.o. male who was seen today for physical therapy evaluation for a power wheelchair. 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 The patient requires substantial assist with his ADLs to the extent that he has a home nurse assist him 7 days per week. He has sustained multiple falls and remains at a high risk for falls. He is unable to efficiently propel a manual wheelchair due to L UE sensory and coordination deficits. He cannot use a rolling walker or rollator for very long household distances due to neuropathic pain and poor activity tolerance.  A power wheelchair would allow him to navigate his home environment more efficiently and independently to participate in his MRADLs. Five times Sit to Stand Test (FTSS) Method: Use a straight back chair with a solid seat that is 17-18" high. Ask participant to sit on the chair with arms folded across their chest.   Instructions: "Stand up and sit down as quickly as possible 5 times,  keeping your arms folded across your chest."   Measurement: Stop timing when the participant touches the chair in sitting the 5th time.  TIME: 20.25 sec with B UE  Cut off scores indicative of increased fall risk: >12 sec CVA, >16 sec PD, >13 sec vestibular (ANPTA Core Set of Outcome Measures for Adults with Neurologic Conditions, 2018). Patient completed the Timed Up and Go test (TUG) in 15.53 seconds.  Geriatrics: need for further assessment of fall risk: >= 12 sec; Recurrent falls: > 15 sec; Vestibular Disorders fall risk: > 15 sec; Parkinson's Disease fall risk: > 16 sec ([email or web address], 2023). His power wheelchair must have a joystick mounted in the R, dominant side that can swing away for safe transfers in and out of his wheelchair. The armrests of the wheelchair should be height adjustable to provide him with adequate UE support, but also flip back for safe transfers in and out of his wheelchair. Full length armrests will be adequate to provide him with UE support as well. A battery is essential to be able to operate his wheelchair. A power wheelchair is a lifelong medical need for this patient to allow him to  safely, efficiently and as independently as possible participate in his MRADLs.    OBJECTIVE IMPAIRMENTS Abnormal gait, decreased activity tolerance, decreased cognition, decreased endurance, decreased knowledge of condition, decreased knowledge of use of DME, difficulty walking, impaired perceived functional ability, impaired sensation, impaired UE functional use, impaired vision/preception, and pain.   ACTIVITY LIMITATIONS carrying, standing, stairs, bathing, dressing, hygiene/grooming, locomotion level, and caring for others  PARTICIPATION LIMITATIONS: meal prep, cleaning, laundry, medication management, interpersonal relationship, driving, shopping, and community activity  PERSONAL FACTORS Age, Behavior pattern, Fitness, Past/current experiences, Social background, Time since onset of  injury/illness/exacerbation, and Transportation are also affecting patient's functional outcome.   REHAB POTENTIAL: Fair time since onset  CLINICAL DECISION MAKING: Stable/uncomplicated  EVALUATION COMPLEXITY: High       GOALS: One time visit. No goals established.    PLAN: PT FREQUENCY: one time visit    Delon DELENA Pop, PT Delon DELENA Pop, PT, DPT, CBIS  03/03/2024, 2:13 PM    I concur with the above findings and recommendations of the therapist:  Physician name printed:         Physician's signature:      Date:

## 2024-03-06 ENCOUNTER — Other Ambulatory Visit: Payer: Self-pay

## 2024-03-06 ENCOUNTER — Other Ambulatory Visit (HOSPITAL_COMMUNITY): Payer: Self-pay

## 2024-03-06 ENCOUNTER — Other Ambulatory Visit: Payer: Self-pay | Admitting: Licensed Clinical Social Worker

## 2024-03-06 DIAGNOSIS — I63311 Cerebral infarction due to thrombosis of right middle cerebral artery: Secondary | ICD-10-CM | POA: Diagnosis not present

## 2024-03-07 ENCOUNTER — Other Ambulatory Visit: Payer: Self-pay | Admitting: Licensed Clinical Social Worker

## 2024-03-07 ENCOUNTER — Other Ambulatory Visit: Payer: Self-pay

## 2024-03-07 DIAGNOSIS — I63311 Cerebral infarction due to thrombosis of right middle cerebral artery: Secondary | ICD-10-CM | POA: Diagnosis not present

## 2024-03-07 NOTE — Patient Instructions (Signed)
 Visit Information  Thank you for taking time to visit with me today. Please don't hesitate to contact me if I can be of assistance to you before our next scheduled appointment.  Our next appointment is by telephone on 04/17/24 at 1:30 PM  Please call the care guide team at [phone] if you need to cancel or reschedule your appointment.   Following is a copy of your care plan:   Goals Addressed             This Visit's Progress    VBC[facility] Social Work Care Plan       Problems:   Transport needs occasionally              Pain issues             Food scarcity issues. Gets some food help occasionally from local food pantry             Decreased family support             Walking challenges ;has to take rest breaks when walking. Discussed possible use of a cane              ADLs completion challenges              Housing needs (mold issues; he is talking with landlord about mold treatment)              Depression issues  CSW Clinical Goal(s):    Over the next 30 days the Patient will attend all scheduled medical appointments as evidenced by patient report and care team review of appointment completion in electronic MEDICAL RECORD NUMBER .            Over the next 30 days patient will communicate with community resources of help to client AEB patient contacting several community agencies for possible help for client (discussed his contacting food pantries,  discussed his contacting Medicaid for transport help)  Interventions:  Discussed medication procurement for client              Discussed ADLs completion. Client said it takes him longer to complete ADLs             Client said he has a home health aide that helps him daily for 2 hours daily in the home .  Home Health aide helps him complete forms as needed and sometimes helps him with some transport needs .             Discussed mobility challenges.He takes rest breaks when walking . He said he may need to get a cane. Discussed fact  that he fell in a store a few weeks ago             Discussed food needs. He said aide helped him complete form to renew Food Stamps benefit and he is getting ready to send in [employer] renewal application.  He also gets some support from a local food pantry              Encouraged client to contact office of NP and discuss with nurse his current needs, for example, his thoughts about whether he needs a cane or not to help him with walking.                 Provided counseling support for client               Discussed pain issues of client.  Discussed sleeping issues. Client said he sleeps some at night and sleeps some during the day               Discussed mood issues of client. He is sad sometimes thinking about his current housing situation and trying to plan for upcoming housing needs. He may get sad related to financial needs. He said he plans to call landlord again to ask for help in addressing mold issues in the home              Discussed transport needs of client               Competed GAD-7 and PHQ 2/9               Discussed client mobility issues. He said he had appointment last week for evaluation for electric wheelchair . Client said he did not think he would qualify to get electric wheelchair at this time.  LCSW encouraged Frank Schneider to talk with NP about w/c evaluation appointment of client last week                 Encouraged client to call LCSW as needed for SW support at [phone]                Client was appreciative of phone call from LCSW               Patient Goals/Self-Care Activities:  Take medications as prescribed              Attend scheduled medical appointments as needed              Allow time for ADLs completion              Call LCSW as needed for SW support at [phone]              Contact rep for [facility] to inquire about vision support help possibly through that agency        Plan:   Telephone follow up appointment with  care management team member scheduled for:  04/17/24 at 1:00 PM         Please go to [facility] [address], [place] [phone]) if you are experiencing a Mental Health or Behavioral Health Crisis or need someone to talk to.  Patient verbalized understanding of Care plan and visit instructions communicated this visit   Frank Schneider  MSW, LCSW [place] Licensed Clinical Social Worker Direct Dial:  [phone] Fax:  [phone] Website:  [email or web address]

## 2024-03-07 NOTE — Patient Outreach (Signed)
 Complex Care Management   Visit Note  03/07/2024  Name:  Frank Schneider MRN: [id number] DOB: 1975-11-28  Situation: Referral received for Complex Care Management related to mobility issues; fall risks; SDOH needs I obtained verbal consent from Patient.  Visit completed with Patient  on the phone  Background:   Past Medical History:  Diagnosis Date   Anticoagulated    ASA 325 mg--- ;managed by pcp  (stroke prevention)   Beta thalassemia trait    hematology/ oncologist--- dr lanny (lov in epic 11-11-2022  elevated A2   Chronic foot pain    Diabetic neuropathy (HCC)    DOE (dyspnea on exertion)    02-23-2023  per pt sob w/ stairs , yard work, and ok with household chores,  does not do any time of walking or exercise   History of cerebrovascular accident (CVA) with residual deficit 12/31/2014   admission in epic;   right thalamic infarct second SVD residual LUE / LLE numb/ ting with sensory deficit   History of eye trauma    02-23-2023  per pt eye injury age late 28s , never seek medical attention,  vision became progressive worse over time;  then pt had stroke 01/ 2020 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,  s/p eye removal 02/ 2024   History of ischemic stroke 05/09/2015   admission in epic;   acute ischemia right ventral pons,  pt was taking his plavix    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 gretta;   taking pletal  twice daily   Leucocytosis    Numbness and tingling of left arm and leg    stroke residual,  sensory deficit resolve   Type 2 diabetes mellitus (HCC)    followed by pcp;   (02-23-2023  per pt has Libre 3,  blood sugar check multiple times daily,  stated average fasting sugar 120s,  stopped doing lantus  daily approx 09/ 2024,  only taking jardiance  daily and weely ozempic )   Vision abnormalities    Vitamin D  deficiency 12/2019   Wears glasses     Assessment: Patient Reported Symptoms:  Cognitive Cognitive Status: Alert and oriented to person, place, and time, Difficulties with attention and concentration Cognitive/Intellectual Conditions Management [RPT]: None reported or documented in medical history or problem list   Health Maintenance Behaviors: Stress management Health Facilitated by: Stress management  Neurological Neurological Review of Symptoms: Dizziness, Headaches, Weakness Neurological Management Strategies: Coping strategies  HEENT HEENT Symptoms Reported: Sudden change or loss of vision HEENT Management Strategies: Coping strategies    Cardiovascular Cardiovascular Symptoms Reported: Dizziness, Fatigue Does patient have uncontrolled Hypertension?: No Cardiovascular Management Strategies: Coping strategies  Respiratory Respiratory Symptoms Reported: Shortness of breath, Wheezing Additional Respiratory Details: concerned about mold issues in apartment Respiratory Management Strategies: Coping strategies  Endocrine Endocrine Symptoms Reported: Weakness or fatigue, Blurry vision, Headaches    Gastrointestinal Gastrointestinal Symptoms Reported: Incontinence Additional Gastrointestinal Details: uses  pull up briefs occasionally Gastrointestinal Management Strategies: Coping strategies    Genitourinary Genitourinary Symptoms Reported: Frequency Genitourinary Management Strategies: Coping strategies, Adequate rest  Integumentary Integumentary Symptoms Reported:  No symptoms reported Skin Management Strategies: Coping strategies  Musculoskeletal Musculoskelatal Symptoms Reviewed: Muscle pain, Back pain, Difficulty walking, Weakness Musculoskeletal Management Strategies: Coping strategies, Adequate rest      Psychosocial Psychosocial Symptoms Reported: Sadness - if selected complete PHQ 2-9, Anxiety - if selected complete GAD, Depression - if selected complete PHQ 2-9 Additional Psychological Details: hx of CVA, weakness, unsteady gait Behavioral Management Strategies: Coping strategies Major Change/Loss/Stressor/Fears (CP): Medical condition, self Techniques to Cope with Loss/Stress/Change: Counseling, Diversional activities, Support group Quality of Family Relationships: unable to assess Do you feel physically threatened by others?: No    03/07/2024    PHQ2-9 Depression Screening   Little interest or pleasure in doing things Several days  Feeling down, depressed, or hopeless Several days  PHQ-2 - Total Score 2  Trouble falling or staying asleep, or sleeping too much Several days  Feeling tired or having little energy Several days  Poor appetite or overeating  Several days  Feeling bad about yourself - or that you are a failure or have let yourself or your family down Several days  Trouble concentrating on things, such as reading the newspaper or watching television Several days  Moving or speaking so slowly that other people could have noticed.  Or the opposite - being so fidgety or restless that you have been moving around a lot more than usual Several days  Thoughts that you would be better off dead, or hurting yourself in some way Not at all  PHQ2-9 Total Score 8  If you checked off any problems, how difficult have these problems made it for you to do your work, take care of things at home, or get along with other people Somewhat difficult  Depression Interventions/Treatment Counseling    Vitals:  No BP issues were mentioned by client  during call with LCSW  Pain Scale: 0-10 Pain Score: 6  Pain Type: Chronic pain Pain Location: Foot Pain Descriptors / Indicators: Aching Pain Onset: Gradual Patients Stated Pain Goal: 1 Pain Intervention(s): Relaxation  Medications Reviewed Today   Medications were not reviewed in this encounter     Recommendation:   PCP Follow-up Continue Current Plan of Care Call LCSW as needed for SW support Communicate with NP about mobility challenges of client  Follow Up Plan:   Telephone follow up appointment date/time:  04/17/24 at 1:30 PM    Glendia Pear  MSW, LCSW [place]/Value Based Care [facility] Licensed Clinical Social Worker Direct Dial:  [phone] Fax:  [phone] Website:  [email or web address]

## 2024-03-08 ENCOUNTER — Other Ambulatory Visit: Payer: Self-pay

## 2024-03-08 DIAGNOSIS — I63311 Cerebral infarction due to thrombosis of right middle cerebral artery: Secondary | ICD-10-CM | POA: Diagnosis not present

## 2024-03-09 DIAGNOSIS — E119 Type 2 diabetes mellitus without complications: Secondary | ICD-10-CM | POA: Diagnosis not present

## 2024-03-09 DIAGNOSIS — R3981 Functional urinary incontinence: Secondary | ICD-10-CM | POA: Diagnosis not present

## 2024-03-09 DIAGNOSIS — I63311 Cerebral infarction due to thrombosis of right middle cerebral artery: Secondary | ICD-10-CM | POA: Diagnosis not present

## 2024-03-10 ENCOUNTER — Ambulatory Visit (INDEPENDENT_AMBULATORY_CARE_PROVIDER_SITE_OTHER): Payer: Self-pay | Admitting: Nurse Practitioner

## 2024-03-10 ENCOUNTER — Encounter: Payer: Self-pay | Admitting: Nurse Practitioner

## 2024-03-10 VITALS — BP 156/67 | HR 75 | Wt 189.0 lb

## 2024-03-10 DIAGNOSIS — E1159 Type 2 diabetes mellitus with other circulatory complications: Secondary | ICD-10-CM | POA: Diagnosis not present

## 2024-03-10 DIAGNOSIS — Z794 Long term (current) use of insulin: Secondary | ICD-10-CM

## 2024-03-10 DIAGNOSIS — I63311 Cerebral infarction due to thrombosis of right middle cerebral artery: Secondary | ICD-10-CM | POA: Diagnosis not present

## 2024-03-10 DIAGNOSIS — M79642 Pain in left hand: Secondary | ICD-10-CM

## 2024-03-10 DIAGNOSIS — M25532 Pain in left wrist: Secondary | ICD-10-CM | POA: Diagnosis not present

## 2024-03-10 LAB — POCT GLYCOSYLATED HEMOGLOBIN (HGB A1C): Hemoglobin A1C: 6.9 % — AB (ref 4.0–5.6)

## 2024-03-10 NOTE — Progress Notes (Addendum)
 "  Subjective   Patient ID: Frank Schneider, male    DOB: Jun 02, 1975, 48 y.o.   MRN: [id number]  Chief Complaint  Patient presents with   Follow-up    Diabetes, stroke, anxiety, depression, foot pain    abnormal sleep patterns    Referring provider: [name], NP  Frank Schneider is a 48 y.o. male with Past Medical History: No date: Anticoagulated     Comment:  ASA 325 mg--- ;managed by pcp  (stroke prevention) No date: Beta thalassemia trait     Comment:  hematology/ oncologist--- dr lanny (arnetta in epic               11-11-2022  elevated A2 No date: Chronic foot pain No date: Diabetic neuropathy (HCC) No date: DOE (dyspnea on exertion)     Comment:  02-23-2023  per pt sob w/ stairs , yard work, and ok               with household chores,  does not do any time of walking               or exercise 12/31/2014: History of cerebrovascular accident (CVA) with residual  deficit     Comment:  admission in epic;   right thalamic infarct second SVD               residual LUE / LLE numb/ ting with sensory deficit No date: History of eye trauma     Comment:  02-23-2023  per pt eye injury age late 38s , never seek               medical attention,  vision became progressive worse over               time;  then pt had stroke 01/ 2020 residual w/ 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,  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/ 2024 05/09/2015: History of ischemic stroke     Comment:  admission in epic;   acute ischemia right ventral pons,               pt was taking his plavix  06/11/2022: History of removal of eye     Comment:  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 and post stroke residual ,  (02-23-2023              per pt does have prosthesis yet) 04/29/2018: History of stroke with residual deficit     Comment:  admission in epic;  acute ischemia left anterolateral               thlamamic/ posterior limb  internal capsule-- residual               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 gretta;  taking pletal  twice  daily No date: Leucocytosis No date: Numbness and tingling of left arm and leg     Comment:  stroke residual,  sensory deficit resolve No date: Type 2 diabetes mellitus (HCC)     Comment:  followed by pcp;   (02-23-2023  per pt has Libre 3,                blood sugar check multiple times daily,  stated average               fasting sugar 120s,  stopped doing lantus  daily approx               09/ 2024,  only taking jardiance  daily and weely ozempic ) No date: Vision abnormalities 12/2019: Vitamin D  deficiency No date: Wears glasses   HPI  Diabetes He presents for his follow-up diabetic visit. He has type 2 diabetes mellitus. His disease course has been improving. There are no hypoglycemic associated symptoms. There are no diabetic associated symptoms. Pertinent negatives for diabetes include no chest pain. There are no hypoglycemic complications. Symptoms are stable. Risk factors for coronary artery disease include male sex, hypertension and diabetes mellitus. He is compliant with treatment all of the time. His weight is stable. He is following a diabetic diet. Meal planning includes avoidance of concentrated sweets. He has not had a previous visit with a dietitian. He participates in exercise intermittently. There is no change in his home blood glucose trend. His overall blood glucose range is 140-180 mg/dl. An ACE inhibitor/angiotensin II receptor blocker is being taken. Eye exam is current. A1c today is 6.9.   Hypertension This is a chronic problem. The problem is unchanged. The problem is controlled. Pertinent negatives  include no chest pain. There are no associated agents to hypertension. Risk factors for coronary artery disease include male gender, obesity, sedentary lifestyle and diabetes mellitus. Past treatments include ACE inhibitors. There are no compliance problems.     Hyperlipidemia This is a chronic problem. The problem is controlled. Exacerbating diseases include diabetes. Pertinent negatives include no chest pain or myalgias. There are no compliance problems.  Risk factors for coronary artery disease include diabetes mellitus and hypertension.   Note: Patient is requesting a power mobile wheelchair.  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  We will place order for this today.  No Known Allergies  Immunization History  Administered Date(s) Administered   Influenza,inj,Quad PF,6+ Mos 01/15/2015   PFIZER(Purple Top)SARS-COV-2 Vaccination 01/03/2020   Pneumococcal Polysaccharide-23 07/29/2011   Tdap 01/15/2015    Tobacco History: Social History   Tobacco Use  Smoking Status Every Day   Types: Cigarettes  Smokeless Tobacco Never  Tobacco Comments   02-23-2023  per pt currently 5 cig per day,  started age 40 (1992),  smoked for 91yrs   Ready to quit: Not Answered Counseling given: Not Answered Tobacco comments: 02-23-2023  per pt currently 5 cig per day,  started age 38 (31),  smoked for 35yrs   Outpatient Encounter Medications as of 03/10/2024  Medication Sig   Accu-Chek Softclix Lancets lancets Use in the morning and at bedtime   amLODipine  (NORVASC ) 10 MG tablet Take 1 tablet (10 mg total) by mouth daily.   aspirin  325 MG tablet Take 1 tablet (325 mg total) by mouth daily.   atorvastatin  (LIPITOR) 40 MG tablet Take 1 tablet (40 mg total) by mouth daily.   benzonatate  (TESSALON ) 200 MG capsule Take 1  capsule (200 mg total) by mouth 3 (three) times daily as needed for cough.   carvedilol  (COREG ) 12.5 MG tablet Take 1 tablet  (12.5 mg total) by mouth 2 (two) times daily with a meal.   cilostazol  (PLETAL ) 100 MG tablet Take 1 tablet (100 mg total) by mouth 2 (two) times daily before a meal.   food thickener (SIMPLYTHICK, HONEY/LEVEL 3/MODERATELY THICK,) LIQD Take 10 packets by mouth as needed.   Glucose Blood (BLOOD GLUCOSE TEST STRIPS) STRP Use 3 (three) times daily as directed to check blood sugar.   insulin  aspart (NOVOLOG ) 100 UNIT/ML FlexPen Inject 4 Units into the skin 3 (three) times daily with meals. Only take if eating a meal AND Blood Glucose (BG) is 80 or higher.   insulin  glargine (LANTUS  SOLOSTAR) 100 UNIT/ML Solostar Pen Inject 12 Units into the skin at bedtime.   Insulin  Pen Needle (PEN NEEDLES) 31G X 5 MM MISC Use 3 times daily as directed   Lancets Misc. (ACCU-CHEK SOFTCLIX LANCET DEV) KIT Use 3 times daily   Lancets MISC Use 3 (three) times daily as directed to check blood sugar.   lisinopril  (ZESTRIL ) 40 MG tablet Take 1 tablet (40 mg total) by mouth daily.   pantoprazole  (PROTONIX ) 40 MG tablet Take 1 tablet (40 mg total) by mouth daily at 12 noon.   acetaminophen  (TYLENOL ) 500 MG tablet Take 2 tablets (Total= 1,000 mg), by mouth, 2 times a day as needed. (Patient not taking: Reported on 03/10/2024)   Blood Glucose Monitoring Suppl (ACCU-CHEK GUIDE) w/Device KIT Use to test blood sugar 3 times daily (Patient not taking: Reported on 03/10/2024)   Continuous Glucose Sensor (FREESTYLE LIBRE 3 SENSOR) MISC Place 1 sensor on the skin every 14 days. Use to check glucose continuously (Patient not taking: Reported on 03/10/2024)   dicyclomine  (BENTYL ) 20 MG tablet Take 1 tablet (20 mg total) by mouth 2 (two) times daily as needed for spasms. (Patient not taking: Reported on 03/10/2024)   guaiFENesin -dextromethorphan  (ROBITUSSIN DM) 100-10 MG/5ML syrup Take 5 mLs by mouth every 4 (four) hours as needed for cough. (Patient not taking: Reported on 03/10/2024)   loperamide  (IMODIUM  A-D) 2 MG tablet Take 1 tablet (2  mg total) by mouth 4 (four) times daily as needed for diarrhea or loose stools. (Patient not taking: Reported on 03/10/2024)   ondansetron  (ZOFRAN -ODT) 4 MG disintegrating tablet Take 1 tablet (4 mg total) by mouth every 8 (eight) hours as needed for nausea or vomiting. (Patient not taking: Reported on 03/10/2024)   [DISCONTINUED] glucose blood (TRUE METRIX BLOOD GLUCOSE TEST) test strip Use as instructed   No facility-administered encounter medications on file as of 03/10/2024.    Review of Systems  Review of Systems  Constitutional: Negative.   HENT: Negative.    Cardiovascular: Negative.   Gastrointestinal: Negative.   Allergic/Immunologic: Negative.   Neurological: Negative.   Psychiatric/Behavioral: Negative.       Objective:   BP (!) 156/67 (BP Location: Left Arm, Patient Position: Sitting, Cuff Size: Large)   Pulse 75   Wt 189 lb (85.7 kg)   SpO2 100%   BMI 27.12 kg/m   Wt Readings from Last 5 Encounters:  03/10/24 189 lb (85.7 kg)  10/20/23 179 lb 9.6 oz (81.5 kg)  08/29/23 160 lb (72.6 kg)  02/22/23 176 lb (79.8 kg)  01/07/23 178 lb 3.2 oz (80.8 kg)     Physical Exam Vitals and nursing note reviewed.  Constitutional:      General: He is  not in acute distress.    Appearance: He is well-developed.  Cardiovascular:     Rate and Rhythm: Normal rate and regular rhythm.  Pulmonary:     Effort: Pulmonary effort is normal.     Breath sounds: Normal breath sounds.  Skin:    General: Skin is warm and dry.  Neurological:     Mental Status: He is alert and oriented to person, place, and time.       Assessment & Plan:   Left wrist pain -     DG Hand Complete Left; Future  Left hand pain -     DG Hand Complete Left; Future  Type 2 diabetes mellitus with other circulatory complication, with long-term current use of insulin  (HCC) -     POCT glycosylated hemoglobin (Hb A1C)     Return in about 3 months (around 06/10/2024).   Bascom GORMAN Borer,  NP 03/10/2024  "

## 2024-03-11 ENCOUNTER — Other Ambulatory Visit: Payer: Self-pay

## 2024-03-11 DIAGNOSIS — I63311 Cerebral infarction due to thrombosis of right middle cerebral artery: Secondary | ICD-10-CM | POA: Diagnosis not present

## 2024-03-12 DIAGNOSIS — I63311 Cerebral infarction due to thrombosis of right middle cerebral artery: Secondary | ICD-10-CM | POA: Diagnosis not present

## 2024-03-14 DIAGNOSIS — I63311 Cerebral infarction due to thrombosis of right middle cerebral artery: Secondary | ICD-10-CM | POA: Diagnosis not present

## 2024-03-15 DIAGNOSIS — I63311 Cerebral infarction due to thrombosis of right middle cerebral artery: Secondary | ICD-10-CM | POA: Diagnosis not present

## 2024-03-17 ENCOUNTER — Other Ambulatory Visit: Payer: Self-pay

## 2024-03-20 ENCOUNTER — Telehealth: Payer: Self-pay | Admitting: Nurse Practitioner

## 2024-03-20 NOTE — Telephone Encounter (Signed)
 Copied from CRM [id number]. Topic: General - Other >> Mar 20, 2024 10:28 AM Thersia BROCKS wrote: Reason for CRM: Alfonso from [facility] in wanting to know if fax was received on 11/17 , stated she will refax it today

## 2024-03-20 NOTE — Telephone Encounter (Signed)
 Copied from CRM #[id number]. Topic: General - Other >> Mar 20, 2024 12:29 PM Sophia H wrote: Reason for CRM: Spoke with Damien from [employer] who is following up on a request for office visit notes for the patient. Trying to get a powered wheelchair approved for him. Stated was sent in on 11/14 - confirmed fax # [phone]  Please be on look out, she will be re faxing. Can't do anything else as far as trying to get the patients wheelchair approved without office visit notes.

## 2024-03-21 ENCOUNTER — Other Ambulatory Visit: Payer: Self-pay

## 2024-03-21 ENCOUNTER — Telehealth: Payer: Self-pay

## 2024-03-21 ENCOUNTER — Ambulatory Visit: Payer: Self-pay

## 2024-03-21 NOTE — Telephone Encounter (Signed)
 Copied from CRM #[id number]. Topic: Clinical - Order For Equipment >> Mar 21, 2024 11:39 AM Sophia H wrote: Reason for CRM: Patient called in very upset stating that PCP told [employer] she is not authorizing him to get powered wheelchair. Patient would like to know why, states PCP is aware of his situation and how bad his neuropathy is so he is not understanding why it was denied. Please reach out to patient by EOD. # [phone]   Pt was denied and would like an appeal. [facility]

## 2024-03-21 NOTE — Telephone Encounter (Signed)
 Sent . Per pt it was denied. KH

## 2024-03-21 NOTE — Progress Notes (Deleted)
 03/21/2024 Name: Frank Schneider MRN: [id number] DOB: Mar 28, 1976  No chief complaint on file.   Frank Schneider is a 48 y.o. year old male who presented for a telephone visit.   They were referred to the pharmacist by their PCP for assistance in managing diabetes. PMH includes CVA (2016, 2020), PAD, T2DM, tobacco dependence, HLD,    Subjective: Patient was seen by PCP, [name], NP, on 10/20/23. At last visit, BP was 123/80 mmHg, HR 68. Patient had recently been hospitalized from 08/29/23 to 09/03/23 for SIRS with gastritis (possible gastoenteritis vs gastroparesis due to Ozempic ). He was discharged on a liquid diet and told to hold Ozempic . He has only been taking Lantus  and Novolog  since discharge. His labwork from PCP appt on 10/20/23 was stable, but BG on BMP was 42 mg/dL. I spoke with him on 11/05/23 and since then he has been lost to follow-up. He recently saw PCP [name], NP on 03/10/24. A1C was 6.9%. BP was elevated. No changes were made, as he was primarily being seen for wrist pain.   Today, patient reports doing ok. No specific complaints at this time. He has not had any recurrent s/sx of nausea or vomiting over the past month.    Care Team: Primary Care Provider: [name], NP ; Next Scheduled Visit: 06/16/24  Medication Access/Adherence  Current Pharmacy:  DARR[facility] LAW - [facility] [address] [place] [address] Phone: [phone] Fax: [phone]  [facility] - [place], [place] - [address] [place] [address] Phone: [phone] Fax: [phone]   Patient reports affordability concerns with their medications: No  Patient reports access/transportation concerns to their pharmacy: No  Patient reports adherence concerns with their medications:  No  - denies missed doses of Lantus  or Novolog . He is a [place] compliance packaging patient.   Diabetes:  Current medications: Lantus   (insulin  glargine) 14 units daily, Novolog  (insulin  aspart) 4 units before each meal (typically 2x per day) Medications tried in the past: Ozempic   Denies any recent GI upset like nausea or vomiting.  Current glucose readings:  Using Accu Chek Guide meter; testing every day (fasting) 11/04/23: 220 mg/dL 2/0/74: 854 mg/dL 05/28/72: 819 mg/dL 05/29/72: 880 mg/dL 05/30/72: 883 mg/dL 05/31/72: 80 mg/dL 06/01/72: 883 mg/dL   He does have some Freestyle Libre 3 sensors left at home, but has not been able to connect them to his phone. He was last connected to LibreView on 08/26/23.    Discussed that his last BMP BG was 42 mg/dL, but he did not remember having any specific symptoms at this time.   Patient denies hypoglycemic s/sx including dizziness, shakiness, sweating. Patient denies hyperglycemic symptoms including polyuria, polydipsia, polyphagia, nocturia, neuropathy, blurred vision.  Current meal patterns: Usually eats 2 meals per day.  Hypertension:  Current medications: amlodipine  10 mg daily, carvedilol  12.5 mg BID, lisinopril  40 mg daily  Patient has a validated, automated, upper arm home BP cuff - home health nurse checks once/week.  Current blood pressure readings readings: Does not recall specific readings, but reports it has been normal  Patient denies hypotensive s/sx including dizziness, lightheadedness.  Patient denies hypertensive symptoms including headache, chest pain, shortness of breath   Hyperlipidemia/ASCVD Risk Reduction  Current lipid lowering medications: atorvastatin  40 mg daily  Antiplatelet regimen: aspirin  325 mg daily, cilostazol  100 mg BID (Rx expired 09/03/22)  ASCVD History: stroke, PAD Risk Factors: tobacco use, known ASCVD, T2DM  Clinical ASCVD: Yes  The ASCVD Risk score (Arnett DK, et al., 2019) failed to calculate for the following reasons:   Risk score cannot be calculated because patient has a medical history suggesting prior/existing ASCVD     Objective:  BP Readings from Last 3 Encounters:  03/10/24 (!) 156/67  10/20/23 123/80  09/03/23 (!) 159/72    Lab Results  Component Value Date   HGBA1C 6.9 (A) 03/10/2024   HGBA1C 8.9 (H) 08/31/2023   HGBA1C 9.0 (H) 08/29/2023       Latest Ref Rng & Units 10/20/2023    2:56 PM 09/03/2023    5:23 AM 09/02/2023    3:41 AM  BMP  Glucose 70 - 99 mg/dL 42  815  823   BUN 6 - 24 mg/dL 11  9  10    Creatinine 0.76 - 1.27 mg/dL 9.04  9.17  9.18   BUN/Creat Ratio 9 - 20 12     Sodium 134 - 144 mmol/L 142  137  137   Potassium 3.5 - 5.2 mmol/L 4.1  4.1  3.9   Chloride 96 - 106 mmol/L 104  103  103   CO2 20 - 29 mmol/L 22  27  28    Calcium  8.7 - 10.2 mg/dL 9.9  8.5  8.6     Lab Results  Component Value Date   CHOL 88 (L) 06/22/2022   HDL 30 (L) 06/22/2022   LDLCALC 43 06/22/2022   TRIG 380 (H) 08/30/2023   CHOLHDL 2.9 06/22/2022    Medications Reviewed Today   Medications were not reviewed in this encounter       Assessment/Plan:   Diabetes: - Currently uncontrolled with most recent A1C of 8.9% above goal <7%. Medication adherence appears optimal. Patient reported FBG are variable. Hesitant to increase insulin  dose today with some FBG at goal and recent BG on BMP of 42 mg/dL. Would prefer to patient restart wearing CGM to detect whether he is having any hypoglycemia. He is amenable to coming in person for CGM setup. Unclear whether GI symptoms leading to hospitalization in May 2025 were related to GLP-1RA, but given current glycemic control and severity of episode, will not restart at this time.   - Last UACR of 13 mg/g on 06/22/22 - Patient denies personal or family history of multiple endocrine neoplasia type 2, medullary thyroid  cancer; personal history of pancreatitis or gallbladder disease. - Reviewed long term cardiovascular and renal outcomes of uncontrolled blood sugar - Reviewed goal A1c, goal fasting, and goal 2 hour post prandial glucose - Reviewed hypoglycemia  management plan and the rule of 15 - Reviewed dietary modifications including  utilizing the healthy plate method, limiting portion size of carbohydrate foods, increasing intake of protein and non-starchy vegetables. Counseled patient to stay hydrated with water throughout the day. - Recommend to continue Lantus  14 units daily and Novolog  4 units before each meal (typically twice daily)  - Recommend to check glucose twice daily: fasting and 2-hr PPG continuously using FL3+ CGM. Educated patient to bring supplies for setup at next appointment. In the meantime, continuing monitoring BG before each meal.  - Next A1C due 12/01/2023    Hypertension: - Currently controlled with last clinic BP below goal less than 130/80. Patient is not having s/sx of hypo- or hyper-tension. Medication adherence appears appropriate.  - Reviewed long term cardiovascular and renal outcomes of uncontrolled blood pressure - Reviewed appropriate blood pressure monitoring technique and reviewed goal blood pressure. Recommended to check home blood pressure and heart rate  once weekly with home health nurse.  - Recommend to continue amlodipine  10 mg daily, carvedilol  12.5 mg BID, lisinopril  40 mg daily    Hyperlipidemia/ASCVD Risk Reduction: - Currently controlled with most recent LDL-C of 43 mg/g below goal < 55 mg/dL given premature and progressive ASCVD. High intensity statin indicated. Noted that recent TG were elevated to 380 mg/dL. Expect improvement with continued adherence to insulin  and improved glycemic control. Noted that prescription for cilostazol  expired in May, which patient was taking for claudication. He does not have any contraindications to continued therapy. Will request that PCP provides refill so this can be included in the patient's next compliance packaging  - Reviewed long term complications of uncontrolled cholesterol - Recommend to continue atorvastatin  40 mg daily.  - Recommend to continue aspirin  325 mg  daily - Recommend to restart cilostazol  100 mg BID. Will collaborate with PCP to place orders.    Follow Up Plan:  Pharmacist in person 12/01/23 PCP clinic visit 06/16/24   Lorain Baseman, PharmD [facility] [phone]

## 2024-03-22 ENCOUNTER — Other Ambulatory Visit: Payer: Self-pay

## 2024-03-24 ENCOUNTER — Other Ambulatory Visit: Payer: Self-pay

## 2024-03-30 NOTE — Telephone Encounter (Signed)
 Pt advised me that it was denied due to a home visit. No new order was placed and if needed please place during visit to ensure smooth transition into next phase. Thank you.

## 2024-03-31 ENCOUNTER — Telehealth: Payer: Self-pay | Admitting: Nurse Practitioner

## 2024-03-31 ENCOUNTER — Other Ambulatory Visit: Payer: Self-pay

## 2024-03-31 NOTE — Telephone Encounter (Signed)
 Copied from CRM #[id number]. Topic: General - Other >> Mar 31, 2024 11:27 AM Willma SAUNDERS wrote: Reason for CRM: Damien from [employer] calling in to a fax she received on 12/03 for the patient visit notes in order to get him a power wheelchair. Damien is asking for it be re-faxed as it cut off after the 3rd page. Fax: [phone] Damien can be reached at [phone]

## 2024-04-04 ENCOUNTER — Other Ambulatory Visit: Payer: Self-pay

## 2024-04-04 ENCOUNTER — Telehealth: Payer: Self-pay | Admitting: Nurse Practitioner

## 2024-04-04 NOTE — Telephone Encounter (Signed)
 Copied from CRM [id number]. Topic: General - Other >> Apr 04, 2024 11:48 AM Wess RAMAN wrote: Reason for CRM: Alfonso from [employer] stated a form was sent several times for personal care services. They have not received it yet. She also need the office visit notes for 03/10/24.  Callback #: [phone] Fax #: [phone]

## 2024-04-05 ENCOUNTER — Other Ambulatory Visit (HOSPITAL_COMMUNITY): Payer: Self-pay

## 2024-04-05 ENCOUNTER — Other Ambulatory Visit: Payer: Self-pay

## 2024-04-06 NOTE — Telephone Encounter (Signed)
 Notes sent using [employer].

## 2024-04-07 ENCOUNTER — Telehealth: Payer: Self-pay | Admitting: Nurse Practitioner

## 2024-04-07 NOTE — Telephone Encounter (Signed)
 Copied from CRM #[id number]. Topic: General - Other >> Apr 07, 2024  9:56 AM Alfonso HERO wrote: Reason for CRM: Bobbi the nurse from [facility] calling stating they rcvd the notes but the 3051 form is missing. She is asking for it to back faxed ASAP to [phone].

## 2024-04-08 DIAGNOSIS — R3981 Functional urinary incontinence: Secondary | ICD-10-CM | POA: Diagnosis not present

## 2024-04-08 DIAGNOSIS — E119 Type 2 diabetes mellitus without complications: Secondary | ICD-10-CM | POA: Diagnosis not present

## 2024-04-11 ENCOUNTER — Telehealth: Payer: Self-pay | Admitting: Nurse Practitioner

## 2024-04-11 NOTE — Telephone Encounter (Signed)
 Forms resent . KH

## 2024-04-11 NOTE — Telephone Encounter (Signed)
 Copied from CRM #[id number]. Topic: Referral - Question >> Apr 10, 2024 12:52 PM Aleatha C wrote: Reason for CRM: Patient needs a call back for the information for the foot doctor that his NP Bascom had referred him to

## 2024-04-12 ENCOUNTER — Telehealth: Payer: Self-pay | Admitting: Nurse Practitioner

## 2024-04-12 NOTE — Telephone Encounter (Signed)
 Copied from CRM #[id number]. Topic: Clinical - Order For Equipment >> Apr 11, 2024  1:04 PM Gustabo D wrote: Damien with [employer]- is calling to get the notes faxed regarding electric chair for pt. She will refax it please fax back with correct info

## 2024-04-13 NOTE — Telephone Encounter (Signed)
 Lvm for pt to call [facility] at [phone] to make an appt. kh

## 2024-04-13 NOTE — Telephone Encounter (Signed)
 Office note was sent through  Fax com.

## 2024-04-17 ENCOUNTER — Other Ambulatory Visit: Payer: Self-pay | Admitting: Licensed Clinical Social Worker

## 2024-04-28 ENCOUNTER — Other Ambulatory Visit (HOSPITAL_COMMUNITY): Payer: Self-pay

## 2024-04-28 ENCOUNTER — Other Ambulatory Visit: Payer: Self-pay | Admitting: Nurse Practitioner

## 2024-04-30 ENCOUNTER — Other Ambulatory Visit (HOSPITAL_COMMUNITY): Payer: Self-pay

## 2024-05-01 ENCOUNTER — Telehealth: Payer: Self-pay

## 2024-05-01 ENCOUNTER — Other Ambulatory Visit: Payer: Self-pay

## 2024-05-01 NOTE — Telephone Encounter (Signed)
 Copied from CRM #[id number]. Topic: General - Other >> Apr 28, 2024  9:35 AM Tonda B wrote: Reason for CRM: patient is wanting to speak to  Frances Ozell RAMAN, LCSW missed his call   please call pt back [phone]

## 2024-05-05 ENCOUNTER — Other Ambulatory Visit: Payer: Self-pay

## 2024-05-05 ENCOUNTER — Other Ambulatory Visit: Payer: Self-pay | Admitting: Nurse Practitioner

## 2024-05-05 DIAGNOSIS — I10 Essential (primary) hypertension: Secondary | ICD-10-CM

## 2024-05-05 MED ORDER — AMLODIPINE BESYLATE 10 MG PO TABS
10.0000 mg | ORAL_TABLET | Freq: Every day | ORAL | 1 refills | Status: AC
  Filled 2024-05-05: qty 30, 30d supply, fill #0

## 2024-05-05 MED ORDER — LISINOPRIL 40 MG PO TABS
40.0000 mg | ORAL_TABLET | Freq: Every day | ORAL | 3 refills | Status: AC
  Filled 2024-05-05: qty 30, 30d supply, fill #0

## 2024-05-06 ENCOUNTER — Other Ambulatory Visit (HOSPITAL_COMMUNITY): Payer: Self-pay

## 2024-05-16 ENCOUNTER — Ambulatory Visit: Payer: Self-pay

## 2024-05-16 NOTE — Progress Notes (Unsigned)
 "  05/16/2024 Name: Frank Schneider MRN: [id number] DOB: August 02, 1975  No chief complaint on file.   Frank Schneider is a 49 y.o. year old male who presented for a telephone visit.   They were referred to the pharmacist by their PCP for assistance in managing diabetes. PMH includes CVA (2016, 2020), PAD, T2DM, tobacco dependence, HLD,    Subjective: Patient was last seen by PCP, Frank Borer, NP, on 03/10/24. BP was 156/67 mmHg. A1C was 6.9%. Patient has been lost to follow-up with pharmacy since July 2025. Previously was trying to assist in setting up CGM monitoring.   Today, ***  10/20/23. At last visit, BP was 123/80 mmHg, HR 68. Patient had recently been hospitalized from 08/29/23 to 09/03/23 for SIRS with gastritis (possible gastoenteritis vs gastroparesis due to Ozempic ). He was discharged on a liquid diet and told to hold Ozempic . He has only been taking Lantus  and Novolog  since discharge. His labwork from PCP appt on 10/20/23 was stable, but BG on BMP was 42 mg/dL.   Today, patient reports doing ok. No specific complaints at this time. He has not had any recurrent s/sx of nausea or vomiting over the past month.    Care Team: Primary Care Provider: Borer Frank RAMAN, NP ; Next Scheduled Visit: 01/20/24  Medication Access/Adherence  Current Pharmacy:  Frank Schneider - [facility] [address] [place] [address] Phone: [phone] Fax: [phone]  [name], [name] Phone: [phone] Fax: [phone]   Patient reports affordability concerns with their medications: No  Patient reports access/transportation concerns to their pharmacy: No  Patient reports adherence concerns with their medications:  No  - denies missed doses of Lantus  or Novolog . 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   Diabetes:  Current medications: Lantus  (insulin  glargine) 14 units  daily, Novolog  (insulin  aspart) 4 units before each meal (typically 2x per day) Medications tried in the past: Ozempic   Denies any recent GI upset like nausea or vomiting.  Current glucose readings:  Using Accu Chek Guide meter; testing every day (fasting) 11/04/23: 220 mg/dL 2/0/74: 854 mg/dL 05/28/72: 819 mg/dL 05/29/72: 880 mg/dL 05/30/72: 883 mg/dL 05/31/72: 80 mg/dL 06/01/72: 883 mg/dL   He does have some Freestyle Libre 3 sensors left at home, but has not been able to connect them to his phone. He was last connected to LibreView on 08/26/23.    Discussed that his last BMP BG was 42 mg/dL, but he did not remember having any specific symptoms at this time.   Patient denies hypoglycemic s/sx including dizziness, shakiness, sweating. Patient denies hyperglycemic symptoms including polyuria, polydipsia, polyphagia, nocturia, neuropathy, blurred vision.  Current meal patterns: Usually eats 2 meals per day.  Hypertension:  Current medications: amlodipine  10 mg daily, carvedilol  12.5 mg BID, lisinopril  40 mg daily  Patient has a validated, automated, upper arm home BP cuff - home health nurse checks once/week.  Current blood pressure readings readings: Does not recall specific readings, but reports it has been normal  Patient denies hypotensive s/sx including dizziness, lightheadedness.  Patient denies hypertensive symptoms including headache, chest pain, shortness of breath   Hyperlipidemia/ASCVD Risk Reduction  Current lipid lowering medications: atorvastatin  40 mg daily  Antiplatelet regimen: aspirin  325 mg daily, cilostazol  100 mg BID (Rx expired 09/03/22)  ASCVD History: stroke, PAD Risk Factors: tobacco use, known ASCVD, T2DM  Clinical ASCVD: Yes  The ASCVD Risk score (Arnett DK,  et al., 2019) failed to calculate for the following reasons:   Risk score cannot be calculated because patient has a medical history suggesting prior/existing ASCVD   * - Cholesterol units were assumed     Objective:  BP Readings from Last 3 Encounters:  03/10/24 (!) 156/67  10/20/23 123/80  09/03/23 (!) 159/72    Lab Results  Component Value Date   HGBA1C 6.9 (A) 03/10/2024   HGBA1C 8.9 (H) 08/31/2023   HGBA1C 9.0 (H) 08/29/2023       Latest Ref Rng & Units 10/20/2023    2:56 PM 09/03/2023    5:23 AM 09/02/2023    3:41 AM  BMP  Glucose 70 - 99 mg/dL 42  815  823   BUN 6 - 24 mg/dL 11  9  10    Creatinine 0.76 - 1.27 mg/dL 9.04  9.17  9.18   BUN/Creat Ratio 9 - 20 12     Sodium 134 - 144 mmol/L 142  137  137   Potassium 3.5 - 5.2 mmol/L 4.1  4.1  3.9   Chloride 96 - 106 mmol/L 104  103  103   CO2 20 - 29 mmol/L 22  27  28    Calcium  8.7 - 10.2 mg/dL 9.9  8.5  8.6     Lab Results  Component Value Date   CHOL 88 (L) 06/22/2022   HDL 30 (L) 06/22/2022   LDLCALC 43 06/22/2022   TRIG 380 (H) 08/30/2023   CHOLHDL 2.9 06/22/2022    Medications Reviewed Today   Medications were not reviewed in this encounter       Assessment/Plan:   Diabetes: - Currently uncontrolled with most recent A1C of 8.9% above goal <7%. Medication adherence appears optimal. Patient reported FBG are variable. Hesitant to increase insulin  dose today with some FBG at goal and recent BG on BMP of 42 mg/dL. Would prefer to patient restart wearing CGM to detect whether he is having any hypoglycemia. He is amenable to coming in person for CGM setup. Unclear whether GI symptoms leading to hospitalization in May 2025 were related to GLP-1RA, but given current glycemic control and severity of episode, will not restart at this time.   - Last UACR of 13 mg/g on 06/22/22 - Patient denies personal or family history of multiple endocrine neoplasia type 2, medullary thyroid  cancer; personal history of pancreatitis or gallbladder disease. - Reviewed long term cardiovascular and renal outcomes of uncontrolled blood sugar - Reviewed goal A1c, goal fasting, and goal 2 hour post prandial glucose - Reviewed hypoglycemia  management plan and the rule of 15 - Reviewed dietary modifications including  utilizing the healthy plate method, limiting portion size of carbohydrate foods, increasing intake of protein and non-starchy vegetables. Counseled patient to stay hydrated with water throughout the day. - Recommend to continue Lantus  14 units daily and Novolog  4 units before each meal (typically twice daily)  - Recommend to check glucose twice daily: fasting and 2-hr PPG continuously using FL3+ CGM. Educated patient to bring supplies for setup at next appointment. In the meantime, continuing monitoring BG before each meal.  - Next A1C due 12/01/2023    Hypertension: - Currently controlled with last clinic BP below goal less than 130/80. Patient is not having s/sx of hypo- or hyper-tension. Medication adherence appears appropriate.  - Reviewed long term cardiovascular and renal outcomes of uncontrolled blood pressure - Reviewed appropriate blood pressure monitoring technique and reviewed goal blood pressure. Recommended to check home blood pressure and  heart rate once weekly with home health nurse.  - Recommend to continue amlodipine  10 mg daily, carvedilol  12.5 mg BID, lisinopril  40 mg daily    Hyperlipidemia/ASCVD Risk Reduction: - Currently controlled with most recent LDL-C of 43 mg/g below goal < 55 mg/dL given premature and progressive ASCVD. High intensity statin indicated. Noted that recent TG were elevated to 380 mg/dL. Expect improvement with continued adherence to insulin  and improved glycemic control. Noted that prescription for cilostazol  expired in May, which patient was taking for claudication. He does not have any contraindications to continued therapy. Will request that PCP provides refill so this can be included in the patient's next compliance packaging  - Reviewed long term complications of uncontrolled cholesterol - Recommend to continue atorvastatin  40 mg daily.  - Recommend to continue aspirin  325 mg  daily - Recommend to restart cilostazol  100 mg BID. Will collaborate with PCP to place orders.    Follow Up Plan:  Pharmacist in person 12/01/23 PCP clinic visit 01/20/24   Lorain Baseman, PharmD [facility] [phone]   "

## 2024-05-23 ENCOUNTER — Telehealth: Payer: Self-pay

## 2024-05-23 MED ORDER — PEN NEEDLES 31G X 5 MM MISC: 1.0000 | Freq: Three times a day (TID) | 0 refills | Status: AC

## 2024-05-23 NOTE — Telephone Encounter (Signed)
 Copied from CRM #[id number]. Topic: Clinical - Medication Refill >> May 23, 2024  1:10 PM Antony S wrote: Medication: Insulin  Pen Needle (PEN NEEDLES) 31G X 5 MM MISC  Has the patient contacted their pharmacy? Yes (Agent: If no, request that the patient contact the pharmacy for the refill. If patient does not wish to contact the pharmacy document the reason why and proceed with request.) (Agent: If yes, when and what did the pharmacy advise?)  This is the patient's preferred pharmacy:    [facility] - [place], [place] - [address] [place] [address] Phone: [phone] Fax: [phone]  Is this the correct pharmacy for this prescription? Yes If no, delete pharmacy and type the correct one.   Has the prescription been filled recently? No  Is the patient out of the medication? Yes  Has the patient been seen for an appointment in the last year OR does the patient have an upcoming appointment? Yes  Can we respond through MyChart? Yes  Agent: Please be advised that Rx refills may take up to 3 business days. We ask that you follow-up with your pharmacy.  Prescription has been sent as preferred. CB.

## 2024-05-24 ENCOUNTER — Ambulatory Visit: Admitting: Podiatry

## 2024-05-26 ENCOUNTER — Encounter: Payer: Self-pay | Admitting: Podiatry

## 2024-05-26 ENCOUNTER — Ambulatory Visit (INDEPENDENT_AMBULATORY_CARE_PROVIDER_SITE_OTHER)

## 2024-05-26 ENCOUNTER — Ambulatory Visit: Admitting: Podiatry

## 2024-05-26 DIAGNOSIS — M2041 Other hammer toe(s) (acquired), right foot: Secondary | ICD-10-CM

## 2024-05-26 DIAGNOSIS — M2042 Other hammer toe(s) (acquired), left foot: Secondary | ICD-10-CM

## 2024-05-29 NOTE — Progress Notes (Signed)
 Subjective:   Patient ID: Frank Schneider, male   DOB: 49 y.o.   MRN: [id number]   HPI Patient presents with severe chronic hammertoe deformity that he would like to have corrected plantar calluses states he is working on his sugar better and feels like his A1c has improved   ROS      Objective:  Physical Exam  Neurovascular status found to be intact muscle strength found to be adequate range of motion is adequate with the possibility for distal vascular disease present.  He does have rigidly contracted digits bilateral 2nd and 3rd does have severe plantar keratotic lesions that are probably related to that condition and relative poor health and living conditions     Assessment:  Digital deformities bilateral lesion formation bilateral with difficult situation due to his living conditions and his diabetes and his overall ability to keep the areas healthy and clean     Plan:  H&P reviewed I do think someday digital fusion but I do like to see him get into a better work environment or better living  situation.  At this point I did courtesy debridement of the lesions did discuss the hammertoes but organ to hold off on any thought of correction currently  X-rays do indicate that there is severe hammertoe deformity rigid contracture moderate osteoporosis noted

## 2024-05-30 ENCOUNTER — Ambulatory Visit: Payer: Self-pay

## 2024-05-30 ENCOUNTER — Telehealth: Payer: Self-pay

## 2024-05-31 ENCOUNTER — Telehealth: Payer: Self-pay

## 2024-05-31 DIAGNOSIS — Z59819 Housing instability, housed unspecified: Secondary | ICD-10-CM

## 2024-06-01 ENCOUNTER — Telehealth: Payer: Self-pay | Admitting: *Deleted

## 2024-06-01 NOTE — Progress Notes (Unsigned)
 Complex Care Management Note Care Guide Note  06/01/2024 Name: Frank Schneider MRN: [id number] DOB: 1976-01-25   Complex Care Management Outreach Attempts: An unsuccessful telephone outreach was attempted today to offer the patient information about available complex care management services.  Follow Up Plan:  Additional outreach attempts will be made to offer the patient complex care management information and services.   Encounter Outcome:  No Answer  Harlene Satterfield  [facility]  [facility] Guide  Direct Dial: [phone]  Fax [phone]

## 2024-06-02 NOTE — Progress Notes (Signed)
 Complex Care Management Note  Care Guide Note 06/02/2024 Name: Benjamyn Hestand MRN: [id number] DOB: 07/23/1975  Torrance Stockley is a 49 y.o. year old male who sees [name], NP for primary care. I reached out to [employer] by phone today to offer complex care management services.  Mr. Eichorn was given information about Complex Care Management services today including:   The Complex Care Management services include support from the care team which includes your Nurse Care Manager, Clinical Social Worker, or Pharmacist.  The Complex Care Management team is here to help remove barriers to the health concerns and goals most important to you. Complex Care Management services are voluntary, and the patient may decline or stop services at any time by request to their care team member.   Complex Care Management Consent Status: Patient agreed to services and verbal consent obtained.   Follow up plan:  Telephone appointment with complex care management team member scheduled for:  06/07/24 & 06/14/24  Encounter Outcome:  Patient Scheduled  Harlene Satterfield  [facility]  [facility], [facility] Guide  Direct Dial: [phone]  Fax [phone]

## 2024-06-07 ENCOUNTER — Telehealth

## 2024-06-14 ENCOUNTER — Telehealth

## 2024-06-16 ENCOUNTER — Ambulatory Visit: Payer: Self-pay | Admitting: Nurse Practitioner
